# Patient Record
Sex: Male | Born: 1960 | Race: White | Hispanic: No | Smoking: Never smoker
Health system: Southern US, Community
[De-identification: ages and names within clinical notes are randomized; demographics above are authoritative.]

## PROBLEM LIST (undated history)

## (undated) DIAGNOSIS — IMO0001 Reserved for inherently not codable concepts without codable children: Secondary | ICD-10-CM

## (undated) DIAGNOSIS — Z91199 Patient's noncompliance with other medical treatment and regimen due to unspecified reason: Secondary | ICD-10-CM

## (undated) DIAGNOSIS — E669 Obesity, unspecified: Secondary | ICD-10-CM

## (undated) DIAGNOSIS — Z8673 Personal history of transient ischemic attack (TIA), and cerebral infarction without residual deficits: Secondary | ICD-10-CM

## (undated) DIAGNOSIS — I10 Essential (primary) hypertension: Secondary | ICD-10-CM

## (undated) DIAGNOSIS — N189 Chronic kidney disease, unspecified: Secondary | ICD-10-CM

## (undated) DIAGNOSIS — R05 Cough: Secondary | ICD-10-CM

## (undated) DIAGNOSIS — E039 Hypothyroidism, unspecified: Secondary | ICD-10-CM

## (undated) DIAGNOSIS — I5022 Chronic systolic (congestive) heart failure: Secondary | ICD-10-CM

## (undated) DIAGNOSIS — R480 Dyslexia and alexia: Secondary | ICD-10-CM

## (undated) DIAGNOSIS — I4891 Unspecified atrial fibrillation: Secondary | ICD-10-CM

## (undated) DIAGNOSIS — I4510 Unspecified right bundle-branch block: Secondary | ICD-10-CM

## (undated) DIAGNOSIS — Z9119 Patient's noncompliance with other medical treatment and regimen: Secondary | ICD-10-CM

## (undated) DIAGNOSIS — R054 Cough syncope: Secondary | ICD-10-CM

## (undated) DIAGNOSIS — M109 Gout, unspecified: Secondary | ICD-10-CM

## (undated) DIAGNOSIS — R55 Syncope and collapse: Secondary | ICD-10-CM

## (undated) DIAGNOSIS — I428 Other cardiomyopathies: Secondary | ICD-10-CM

## (undated) HISTORY — DX: Personal history of transient ischemic attack (TIA), and cerebral infarction without residual deficits: Z86.73

## (undated) HISTORY — DX: Unspecified right bundle-branch block: I45.10

## (undated) HISTORY — DX: Patient's noncompliance with other medical treatment and regimen: Z91.19

## (undated) HISTORY — PX: PICC LINE PLACE PERIPHERAL (ARMC HX): HXRAD1248

## (undated) HISTORY — DX: Dyslexia and alexia: R48.0

## (undated) HISTORY — DX: Hypothyroidism, unspecified: E03.9

## (undated) HISTORY — DX: Other cardiomyopathies: I42.8

## (undated) HISTORY — DX: Unspecified atrial fibrillation: I48.91

## (undated) HISTORY — DX: Obesity, unspecified: E66.9

## (undated) HISTORY — DX: Chronic systolic (congestive) heart failure: I50.22

## (undated) HISTORY — PX: CARDIAC CATHETERIZATION: SHX172

## (undated) HISTORY — DX: Patient's noncompliance with other medical treatment and regimen due to unspecified reason: Z91.199

## (undated) HISTORY — PX: KNEE ARTHROSCOPY: SHX127

## (undated) HISTORY — DX: Essential (primary) hypertension: I10

## (undated) HISTORY — DX: Gout, unspecified: M10.9

---

## 1996-01-19 DIAGNOSIS — E039 Hypothyroidism, unspecified: Secondary | ICD-10-CM

## 1996-01-19 DIAGNOSIS — I5022 Chronic systolic (congestive) heart failure: Secondary | ICD-10-CM

## 1996-01-19 HISTORY — DX: Hypothyroidism, unspecified: E03.9

## 1996-01-19 HISTORY — DX: Chronic systolic (congestive) heart failure: I50.22

## 1997-05-10 ENCOUNTER — Ambulatory Visit (HOSPITAL_COMMUNITY): Admission: RE | Admit: 1997-05-10 | Discharge: 1997-05-10 | Payer: Self-pay | Admitting: Cardiology

## 2002-02-01 ENCOUNTER — Encounter: Admission: RE | Admit: 2002-02-01 | Discharge: 2002-02-01 | Payer: Self-pay | Admitting: Family Medicine

## 2002-02-01 ENCOUNTER — Encounter: Payer: Self-pay | Admitting: Family Medicine

## 2003-12-18 ENCOUNTER — Ambulatory Visit: Payer: Self-pay | Admitting: Cardiology

## 2004-04-09 ENCOUNTER — Ambulatory Visit: Payer: Self-pay

## 2004-12-30 ENCOUNTER — Encounter: Payer: Self-pay | Admitting: Cardiology

## 2004-12-30 ENCOUNTER — Ambulatory Visit: Payer: Self-pay

## 2005-01-04 ENCOUNTER — Ambulatory Visit: Payer: Self-pay | Admitting: Cardiology

## 2005-01-20 ENCOUNTER — Ambulatory Visit: Payer: Self-pay | Admitting: Cardiology

## 2005-02-22 ENCOUNTER — Ambulatory Visit: Payer: Self-pay | Admitting: Cardiology

## 2005-03-08 ENCOUNTER — Ambulatory Visit: Payer: Self-pay | Admitting: Cardiology

## 2005-03-26 ENCOUNTER — Ambulatory Visit: Payer: Self-pay | Admitting: Cardiology

## 2005-05-07 ENCOUNTER — Ambulatory Visit: Payer: Self-pay | Admitting: Internal Medicine

## 2005-08-24 ENCOUNTER — Ambulatory Visit: Payer: Self-pay | Admitting: Cardiology

## 2006-09-23 ENCOUNTER — Ambulatory Visit: Payer: Self-pay | Admitting: Cardiology

## 2006-09-27 ENCOUNTER — Ambulatory Visit: Payer: Self-pay | Admitting: Cardiology

## 2006-09-27 LAB — CONVERTED CEMR LAB
BUN: 12 mg/dL
CO2: 26 meq/L
Calcium: 8.8 mg/dL
Chloride: 109 meq/L
Creatinine, Ser: 1.3 mg/dL
GFR calc Af Amer: 77 mL/min
GFR calc non Af Amer: 64 mL/min
Glucose, Bld: 106 mg/dL — ABNORMAL HIGH
Potassium: 4.2 meq/L
Pro B Natriuretic peptide (BNP): 560 pg/mL — ABNORMAL HIGH
Sodium: 142 meq/L
TSH: 1.72 u[IU]/mL

## 2006-10-17 ENCOUNTER — Ambulatory Visit: Payer: Self-pay | Admitting: Cardiology

## 2006-10-17 LAB — CONVERTED CEMR LAB
BUN: 17 mg/dL (ref 6–23)
Calcium: 9 mg/dL (ref 8.4–10.5)
Chloride: 110 meq/L (ref 96–112)
GFR calc non Af Amer: 64 mL/min
Glucose, Bld: 113 mg/dL — ABNORMAL HIGH (ref 70–99)

## 2006-10-25 ENCOUNTER — Ambulatory Visit: Payer: Self-pay | Admitting: Pulmonary Disease

## 2006-11-04 ENCOUNTER — Ambulatory Visit: Payer: Self-pay | Admitting: Cardiology

## 2006-11-08 ENCOUNTER — Encounter: Payer: Self-pay | Admitting: Cardiology

## 2006-11-08 ENCOUNTER — Ambulatory Visit: Payer: Self-pay | Admitting: Cardiology

## 2006-11-08 ENCOUNTER — Observation Stay (HOSPITAL_COMMUNITY): Admission: AD | Admit: 2006-11-08 | Discharge: 2006-11-10 | Payer: Self-pay | Admitting: Cardiology

## 2006-11-08 ENCOUNTER — Ambulatory Visit: Payer: Self-pay | Admitting: Cardiovascular Disease

## 2006-11-16 ENCOUNTER — Ambulatory Visit: Payer: Self-pay | Admitting: Internal Medicine

## 2007-01-26 ENCOUNTER — Ambulatory Visit: Payer: Self-pay | Admitting: Internal Medicine

## 2007-02-16 ENCOUNTER — Ambulatory Visit: Payer: Self-pay | Admitting: Internal Medicine

## 2007-06-05 ENCOUNTER — Ambulatory Visit: Payer: Self-pay | Admitting: Cardiology

## 2007-06-05 LAB — CONVERTED CEMR LAB
BUN: 17 mg/dL (ref 6–23)
Chloride: 105 meq/L (ref 96–112)
GFR calc non Af Amer: 64 mL/min
Potassium: 4.4 meq/L (ref 3.5–5.1)
Pro B Natriuretic peptide (BNP): 495 pg/mL — ABNORMAL HIGH (ref 0.0–100.0)
Sodium: 140 meq/L (ref 135–145)
TSH: 1.38 microintl units/mL (ref 0.35–5.50)

## 2007-07-05 ENCOUNTER — Ambulatory Visit: Payer: Self-pay | Admitting: Internal Medicine

## 2007-08-09 ENCOUNTER — Ambulatory Visit: Payer: Self-pay | Admitting: Internal Medicine

## 2007-08-09 LAB — CONVERTED CEMR LAB
Calcium: 9.5 mg/dL (ref 8.4–10.5)
GFR calc Af Amer: 65 mL/min
GFR calc non Af Amer: 54 mL/min
Glucose, Bld: 94 mg/dL (ref 70–99)
Potassium: 4.1 meq/L (ref 3.5–5.1)
Pro B Natriuretic peptide (BNP): 257 pg/mL — ABNORMAL HIGH (ref 0.0–100.0)
Sodium: 138 meq/L (ref 135–145)

## 2007-09-07 ENCOUNTER — Ambulatory Visit: Payer: Self-pay | Admitting: Internal Medicine

## 2007-09-15 ENCOUNTER — Ambulatory Visit (HOSPITAL_COMMUNITY): Admission: RE | Admit: 2007-09-15 | Discharge: 2007-09-15 | Payer: Self-pay | Admitting: Internal Medicine

## 2007-09-15 ENCOUNTER — Ambulatory Visit: Payer: Self-pay | Admitting: Internal Medicine

## 2007-10-11 ENCOUNTER — Ambulatory Visit: Payer: Self-pay | Admitting: Cardiology

## 2007-12-01 ENCOUNTER — Ambulatory Visit: Payer: Self-pay | Admitting: Cardiology

## 2007-12-01 LAB — CONVERTED CEMR LAB
Calcium: 9.5 mg/dL (ref 8.4–10.5)
GFR calc Af Amer: 84 mL/min
GFR calc non Af Amer: 70 mL/min
Potassium: 4.1 meq/L (ref 3.5–5.1)
Pro B Natriuretic peptide (BNP): 183 pg/mL — ABNORMAL HIGH (ref 0.0–100.0)
Sodium: 140 meq/L (ref 135–145)

## 2008-01-02 ENCOUNTER — Ambulatory Visit: Payer: Self-pay | Admitting: Cardiology

## 2008-02-06 ENCOUNTER — Ambulatory Visit: Payer: Self-pay | Admitting: Cardiology

## 2008-02-06 LAB — CONVERTED CEMR LAB
Calcium: 9.2 mg/dL (ref 8.4–10.5)
Creatinine, Ser: 1.2 mg/dL (ref 0.4–1.5)
GFR calc Af Amer: 84 mL/min
Magnesium: 2.2 mg/dL (ref 1.5–2.5)
Sodium: 140 meq/L (ref 135–145)

## 2008-03-08 ENCOUNTER — Ambulatory Visit: Payer: Self-pay | Admitting: Cardiology

## 2008-04-18 DIAGNOSIS — F810 Specific reading disorder: Secondary | ICD-10-CM

## 2008-04-18 DIAGNOSIS — E669 Obesity, unspecified: Secondary | ICD-10-CM

## 2008-04-18 DIAGNOSIS — I119 Hypertensive heart disease without heart failure: Secondary | ICD-10-CM | POA: Insufficient documentation

## 2008-04-18 DIAGNOSIS — R002 Palpitations: Secondary | ICD-10-CM | POA: Insufficient documentation

## 2008-04-23 ENCOUNTER — Ambulatory Visit: Payer: Self-pay | Admitting: Cardiology

## 2008-04-23 ENCOUNTER — Encounter: Payer: Self-pay | Admitting: Cardiology

## 2008-05-29 ENCOUNTER — Ambulatory Visit: Payer: Self-pay | Admitting: Cardiology

## 2008-07-17 ENCOUNTER — Telehealth: Payer: Self-pay | Admitting: Cardiology

## 2008-07-23 ENCOUNTER — Encounter: Payer: Self-pay | Admitting: Cardiology

## 2008-07-29 ENCOUNTER — Telehealth: Payer: Self-pay | Admitting: Cardiology

## 2008-07-29 ENCOUNTER — Ambulatory Visit: Payer: Self-pay | Admitting: Cardiology

## 2008-07-30 ENCOUNTER — Telehealth (INDEPENDENT_AMBULATORY_CARE_PROVIDER_SITE_OTHER): Payer: Self-pay | Admitting: *Deleted

## 2008-08-27 ENCOUNTER — Encounter: Payer: Self-pay | Admitting: Cardiology

## 2008-08-27 ENCOUNTER — Telehealth: Payer: Self-pay | Admitting: Cardiology

## 2008-09-02 ENCOUNTER — Telehealth: Payer: Self-pay | Admitting: Cardiology

## 2008-09-03 ENCOUNTER — Encounter: Payer: Self-pay | Admitting: Cardiology

## 2008-09-05 ENCOUNTER — Encounter (INDEPENDENT_AMBULATORY_CARE_PROVIDER_SITE_OTHER): Payer: Self-pay | Admitting: Nurse Practitioner

## 2008-10-15 ENCOUNTER — Encounter: Payer: Self-pay | Admitting: Cardiology

## 2008-10-25 ENCOUNTER — Encounter: Payer: Self-pay | Admitting: Cardiology

## 2008-11-12 ENCOUNTER — Ambulatory Visit: Payer: Self-pay | Admitting: Cardiology

## 2008-11-12 ENCOUNTER — Telehealth: Payer: Self-pay | Admitting: Cardiology

## 2008-11-18 ENCOUNTER — Ambulatory Visit: Payer: Self-pay | Admitting: Cardiology

## 2008-11-18 ENCOUNTER — Inpatient Hospital Stay (HOSPITAL_COMMUNITY): Admission: EM | Admit: 2008-11-18 | Discharge: 2008-11-27 | Payer: Self-pay | Admitting: Emergency Medicine

## 2008-11-18 ENCOUNTER — Ambulatory Visit: Payer: Self-pay | Admitting: Internal Medicine

## 2008-11-18 DIAGNOSIS — Z8673 Personal history of transient ischemic attack (TIA), and cerebral infarction without residual deficits: Secondary | ICD-10-CM

## 2008-11-18 HISTORY — DX: Personal history of transient ischemic attack (TIA), and cerebral infarction without residual deficits: Z86.73

## 2008-11-18 HISTORY — PX: SP PTA ADD INTRA CRAN: HXRAD405

## 2008-11-19 ENCOUNTER — Encounter (INDEPENDENT_AMBULATORY_CARE_PROVIDER_SITE_OTHER): Payer: Self-pay | Admitting: Neurology

## 2008-11-19 ENCOUNTER — Ambulatory Visit: Payer: Self-pay | Admitting: Surgery

## 2008-11-21 ENCOUNTER — Encounter (INDEPENDENT_AMBULATORY_CARE_PROVIDER_SITE_OTHER): Payer: Self-pay | Admitting: Neurology

## 2008-11-25 ENCOUNTER — Ambulatory Visit: Payer: Self-pay | Admitting: Physical Medicine & Rehabilitation

## 2008-11-27 ENCOUNTER — Encounter (INDEPENDENT_AMBULATORY_CARE_PROVIDER_SITE_OTHER): Payer: Self-pay | Admitting: Cardiology

## 2008-11-27 ENCOUNTER — Inpatient Hospital Stay (HOSPITAL_COMMUNITY)
Admission: RE | Admit: 2008-11-27 | Discharge: 2008-12-25 | Payer: Self-pay | Admitting: Physical Medicine & Rehabilitation

## 2008-11-27 ENCOUNTER — Ambulatory Visit: Payer: Self-pay | Admitting: Cardiology

## 2008-12-03 ENCOUNTER — Ambulatory Visit: Payer: Self-pay | Admitting: Psychology

## 2008-12-07 ENCOUNTER — Ambulatory Visit: Payer: Self-pay | Admitting: Physical Medicine & Rehabilitation

## 2008-12-26 ENCOUNTER — Encounter (INDEPENDENT_AMBULATORY_CARE_PROVIDER_SITE_OTHER): Payer: Self-pay | Admitting: Nurse Practitioner

## 2008-12-27 ENCOUNTER — Encounter (INDEPENDENT_AMBULATORY_CARE_PROVIDER_SITE_OTHER): Payer: Self-pay | Admitting: Cardiology

## 2008-12-27 ENCOUNTER — Encounter: Payer: Self-pay | Admitting: Cardiology

## 2008-12-27 LAB — CONVERTED CEMR LAB: POC INR: 3.07

## 2008-12-30 ENCOUNTER — Encounter: Payer: Self-pay | Admitting: Cardiovascular Disease

## 2008-12-30 ENCOUNTER — Encounter
Admission: RE | Admit: 2008-12-30 | Discharge: 2009-01-16 | Payer: Self-pay | Admitting: Physical Medicine & Rehabilitation

## 2008-12-30 LAB — CONVERTED CEMR LAB: Prothrombin Time: 30.6 s

## 2009-01-03 ENCOUNTER — Ambulatory Visit: Payer: Self-pay | Admitting: Cardiovascular Disease

## 2009-01-27 ENCOUNTER — Encounter
Admission: RE | Admit: 2009-01-27 | Discharge: 2009-04-27 | Payer: Self-pay | Admitting: Physical Medicine & Rehabilitation

## 2009-02-13 ENCOUNTER — Encounter: Payer: Self-pay | Admitting: Cardiology

## 2009-02-17 ENCOUNTER — Ambulatory Visit: Payer: Self-pay | Admitting: Cardiology

## 2009-02-17 ENCOUNTER — Encounter (INDEPENDENT_AMBULATORY_CARE_PROVIDER_SITE_OTHER): Payer: Self-pay | Admitting: *Deleted

## 2009-02-19 ENCOUNTER — Telehealth: Payer: Self-pay | Admitting: Cardiology

## 2009-02-24 ENCOUNTER — Ambulatory Visit: Payer: Self-pay | Admitting: Cardiovascular Disease

## 2009-02-24 ENCOUNTER — Telehealth: Payer: Self-pay | Admitting: Cardiology

## 2009-02-24 LAB — CONVERTED CEMR LAB: POC INR: 1.7

## 2009-03-03 ENCOUNTER — Telehealth: Payer: Self-pay | Admitting: Cardiology

## 2009-03-04 ENCOUNTER — Telehealth: Payer: Self-pay | Admitting: Cardiology

## 2009-03-13 ENCOUNTER — Ambulatory Visit: Payer: Self-pay | Admitting: Cardiology

## 2009-03-13 LAB — CONVERTED CEMR LAB: POC INR: 1.6

## 2009-03-14 LAB — CONVERTED CEMR LAB
Chloride: 108 meq/L (ref 96–112)
GFR calc non Af Amer: 69.14 mL/min (ref >60)
Potassium: 4.7 meq/L (ref 3.5–5.1)
Pro B Natriuretic peptide (BNP): 658 pg/mL — ABNORMAL HIGH (ref 0.0–100.0)
Sodium: 141 meq/L (ref 135–145)

## 2009-03-17 ENCOUNTER — Ambulatory Visit: Payer: Self-pay | Admitting: Cardiology

## 2009-03-19 ENCOUNTER — Telehealth: Payer: Self-pay | Admitting: Cardiology

## 2009-03-19 ENCOUNTER — Ambulatory Visit: Payer: Self-pay | Admitting: Cardiology

## 2009-03-20 LAB — CONVERTED CEMR LAB
CO2: 32 meq/L (ref 19–32)
Calcium: 9.5 mg/dL (ref 8.4–10.5)
Chloride: 101 meq/L (ref 96–112)
Glucose, Bld: 125 mg/dL — ABNORMAL HIGH (ref 70–99)
Potassium: 4.3 meq/L (ref 3.5–5.1)
Sodium: 137 meq/L (ref 135–145)

## 2009-03-28 ENCOUNTER — Ambulatory Visit: Payer: Self-pay | Admitting: Cardiology

## 2009-03-28 LAB — CONVERTED CEMR LAB: POC INR: 1.9

## 2009-04-11 ENCOUNTER — Ambulatory Visit: Payer: Self-pay | Admitting: Internal Medicine

## 2009-04-11 LAB — CONVERTED CEMR LAB: POC INR: 2

## 2009-04-25 ENCOUNTER — Ambulatory Visit: Payer: Self-pay | Admitting: Cardiology

## 2009-04-25 LAB — CONVERTED CEMR LAB: POC INR: 1.7

## 2009-05-05 ENCOUNTER — Ambulatory Visit: Payer: Self-pay | Admitting: Cardiology

## 2009-05-05 LAB — CONVERTED CEMR LAB: POC INR: 1.7

## 2009-05-19 ENCOUNTER — Ambulatory Visit: Payer: Self-pay | Admitting: Cardiology

## 2009-05-19 ENCOUNTER — Telehealth: Payer: Self-pay | Admitting: Cardiology

## 2009-05-19 LAB — CONVERTED CEMR LAB
CO2: 28 meq/L (ref 19–32)
Calcium: 9.2 mg/dL (ref 8.4–10.5)
Creatinine, Ser: 1.2 mg/dL (ref 0.4–1.5)
GFR calc non Af Amer: 69.08 mL/min (ref >60)
POC INR: 1.5
Sodium: 139 meq/L (ref 135–145)

## 2009-05-20 ENCOUNTER — Telehealth: Payer: Self-pay | Admitting: Cardiology

## 2009-06-05 ENCOUNTER — Ambulatory Visit: Payer: Self-pay | Admitting: Internal Medicine

## 2009-06-06 ENCOUNTER — Telehealth: Payer: Self-pay | Admitting: Cardiology

## 2009-06-06 ENCOUNTER — Inpatient Hospital Stay (HOSPITAL_COMMUNITY): Admission: EM | Admit: 2009-06-06 | Discharge: 2009-06-13 | Payer: Self-pay | Admitting: Emergency Medicine

## 2009-06-06 ENCOUNTER — Ambulatory Visit: Payer: Self-pay | Admitting: Cardiology

## 2009-06-06 ENCOUNTER — Encounter: Payer: Self-pay | Admitting: Cardiology

## 2009-06-09 ENCOUNTER — Encounter: Payer: Self-pay | Admitting: Internal Medicine

## 2009-06-10 ENCOUNTER — Encounter: Payer: Self-pay | Admitting: Internal Medicine

## 2009-06-11 ENCOUNTER — Encounter: Payer: Self-pay | Admitting: Internal Medicine

## 2009-06-11 HISTORY — PX: BI-VENTRICULAR IMPLANTABLE CARDIOVERTER DEFIBRILLATOR  (CRT-D): SHX5747

## 2009-06-12 ENCOUNTER — Encounter: Payer: Self-pay | Admitting: Internal Medicine

## 2009-06-18 ENCOUNTER — Ambulatory Visit: Payer: Self-pay | Admitting: Cardiology

## 2009-06-18 LAB — CONVERTED CEMR LAB: POC INR: 1.1

## 2009-06-25 ENCOUNTER — Ambulatory Visit: Payer: Self-pay | Admitting: Cardiology

## 2009-06-25 ENCOUNTER — Encounter: Payer: Self-pay | Admitting: Internal Medicine

## 2009-06-25 ENCOUNTER — Ambulatory Visit: Payer: Self-pay

## 2009-06-26 ENCOUNTER — Ambulatory Visit: Payer: Self-pay | Admitting: Internal Medicine

## 2009-06-26 DIAGNOSIS — Z95810 Presence of automatic (implantable) cardiac defibrillator: Secondary | ICD-10-CM

## 2009-06-27 ENCOUNTER — Telehealth: Payer: Self-pay | Admitting: Internal Medicine

## 2009-06-30 ENCOUNTER — Ambulatory Visit: Payer: Self-pay | Admitting: Cardiology

## 2009-06-30 ENCOUNTER — Telehealth: Payer: Self-pay | Admitting: Internal Medicine

## 2009-06-30 LAB — CONVERTED CEMR LAB
Basophils Absolute: 0.1 10*3/uL (ref 0.0–0.1)
Eosinophils Absolute: 0.1 10*3/uL (ref 0.0–0.7)
HCT: 41.2 % (ref 39.0–52.0)
Hemoglobin: 14.1 g/dL (ref 13.0–17.0)
Lymphocytes Relative: 16 % (ref 12.0–46.0)
Lymphs Abs: 1.2 10*3/uL (ref 0.7–4.0)
MCHC: 34.3 g/dL (ref 30.0–36.0)
Neutro Abs: 5.7 10*3/uL (ref 1.4–7.7)
POC INR: 2.4
Platelets: 305 10*3/uL (ref 150.0–400.0)
RDW: 16.4 % — ABNORMAL HIGH (ref 11.5–14.6)

## 2009-07-08 ENCOUNTER — Telehealth: Payer: Self-pay | Admitting: Internal Medicine

## 2009-07-14 ENCOUNTER — Ambulatory Visit: Payer: Self-pay | Admitting: Cardiology

## 2009-07-14 ENCOUNTER — Ambulatory Visit: Payer: Self-pay | Admitting: Internal Medicine

## 2009-07-14 LAB — CONVERTED CEMR LAB
INR: 3
POC INR: 3

## 2009-07-18 ENCOUNTER — Telehealth: Payer: Self-pay | Admitting: Internal Medicine

## 2009-07-23 ENCOUNTER — Telehealth: Payer: Self-pay | Admitting: Cardiology

## 2009-07-23 ENCOUNTER — Ambulatory Visit: Payer: Self-pay | Admitting: Internal Medicine

## 2009-07-25 LAB — CONVERTED CEMR LAB
Basophils Absolute: 0.1 10*3/uL (ref 0.0–0.1)
Bilirubin, Direct: 0.1 mg/dL (ref 0.0–0.3)
CO2: 30 meq/L (ref 19–32)
Calcium: 9 mg/dL (ref 8.4–10.5)
Creatinine, Ser: 1.4 mg/dL (ref 0.4–1.5)
Eosinophils Absolute: 0.3 10*3/uL (ref 0.0–0.7)
Glucose, Bld: 93 mg/dL (ref 70–99)
INR: 3.1 — ABNORMAL HIGH (ref 0.8–1.0)
Lymphocytes Relative: 22.4 % (ref 12.0–46.0)
MCHC: 34.3 g/dL (ref 30.0–36.0)
Neutrophils Relative %: 63 % (ref 43.0–77.0)
Platelets: 219 10*3/uL (ref 150.0–400.0)
RDW: 15.4 % — ABNORMAL HIGH (ref 11.5–14.6)
TSH: 21.41 microintl units/mL — ABNORMAL HIGH (ref 0.35–5.50)
Total Bilirubin: 0.7 mg/dL (ref 0.3–1.2)
Total Protein: 6.5 g/dL (ref 6.0–8.3)
aPTT: 45.5 s — ABNORMAL HIGH (ref 21.7–28.8)

## 2009-07-27 ENCOUNTER — Emergency Department (HOSPITAL_COMMUNITY): Admission: EM | Admit: 2009-07-27 | Discharge: 2009-07-27 | Payer: Self-pay | Admitting: Emergency Medicine

## 2009-07-29 ENCOUNTER — Telehealth: Payer: Self-pay | Admitting: Cardiology

## 2009-07-30 ENCOUNTER — Ambulatory Visit: Payer: Self-pay | Admitting: Internal Medicine

## 2009-07-30 ENCOUNTER — Ambulatory Visit (HOSPITAL_COMMUNITY): Admission: RE | Admit: 2009-07-30 | Discharge: 2009-07-30 | Payer: Self-pay | Admitting: Internal Medicine

## 2009-08-05 ENCOUNTER — Encounter: Payer: Self-pay | Admitting: Cardiology

## 2009-08-06 ENCOUNTER — Ambulatory Visit: Payer: Self-pay | Admitting: Cardiology

## 2009-08-06 LAB — CONVERTED CEMR LAB: POC INR: 2.6

## 2009-08-11 ENCOUNTER — Telehealth: Payer: Self-pay | Admitting: Internal Medicine

## 2009-09-03 ENCOUNTER — Ambulatory Visit: Payer: Self-pay | Admitting: Cardiology

## 2009-09-03 LAB — CONVERTED CEMR LAB: POC INR: 3

## 2009-09-16 ENCOUNTER — Ambulatory Visit: Payer: Self-pay | Admitting: Internal Medicine

## 2009-09-17 ENCOUNTER — Ambulatory Visit: Payer: Self-pay | Admitting: Cardiology

## 2009-09-23 LAB — CONVERTED CEMR LAB
CO2: 28 meq/L (ref 19–32)
Calcium: 9.2 mg/dL (ref 8.4–10.5)
Creatinine, Ser: 1.5 mg/dL (ref 0.4–1.5)

## 2009-10-01 ENCOUNTER — Ambulatory Visit: Payer: Self-pay | Admitting: Internal Medicine

## 2009-10-01 LAB — CONVERTED CEMR LAB: POC INR: 1.5

## 2009-10-22 ENCOUNTER — Ambulatory Visit: Payer: Self-pay | Admitting: Cardiovascular Disease

## 2009-11-18 ENCOUNTER — Encounter: Payer: Self-pay | Admitting: Cardiology

## 2009-11-19 ENCOUNTER — Ambulatory Visit: Payer: Self-pay | Admitting: Cardiology

## 2009-12-03 ENCOUNTER — Ambulatory Visit: Payer: Self-pay | Admitting: Cardiology

## 2009-12-03 ENCOUNTER — Ambulatory Visit: Payer: Self-pay | Admitting: Internal Medicine

## 2009-12-03 LAB — CONVERTED CEMR LAB: POC INR: 1.1

## 2009-12-17 ENCOUNTER — Ambulatory Visit: Payer: Self-pay | Admitting: Internal Medicine

## 2009-12-17 LAB — CONVERTED CEMR LAB: POC INR: 1.1

## 2009-12-24 ENCOUNTER — Ambulatory Visit: Payer: Self-pay | Admitting: Cardiology

## 2009-12-24 LAB — CONVERTED CEMR LAB: POC INR: 1.6

## 2009-12-29 ENCOUNTER — Telehealth: Payer: Self-pay | Admitting: Cardiology

## 2010-01-07 ENCOUNTER — Ambulatory Visit: Payer: Self-pay | Admitting: Cardiology

## 2010-01-21 ENCOUNTER — Ambulatory Visit: Admission: RE | Admit: 2010-01-21 | Discharge: 2010-01-21 | Payer: Self-pay | Source: Home / Self Care

## 2010-01-21 LAB — CONVERTED CEMR LAB: POC INR: 1.2

## 2010-01-28 ENCOUNTER — Ambulatory Visit: Admission: RE | Admit: 2010-01-28 | Discharge: 2010-01-28 | Payer: Self-pay | Source: Home / Self Care

## 2010-01-28 LAB — CONVERTED CEMR LAB: POC INR: 2.6

## 2010-02-08 ENCOUNTER — Encounter: Payer: Self-pay | Admitting: Physical Medicine & Rehabilitation

## 2010-02-15 LAB — CONVERTED CEMR LAB
Basophils Absolute: 0.1 10*3/uL (ref 0.0–0.1)
CO2: 30 meq/L (ref 19–32)
Calcium: 9.2 mg/dL (ref 8.4–10.5)
Creatinine, Ser: 1.3 mg/dL (ref 0.4–1.5)
Eosinophils Absolute: 0.7 10*3/uL (ref 0.0–0.7)
GFR calc non Af Amer: 63.13 mL/min (ref >60)
Glucose, Bld: 113 mg/dL — ABNORMAL HIGH (ref 70–99)
Hemoglobin: 14.9 g/dL (ref 13.0–17.0)
INR: 1 (ref 0.8–1.0)
Lymphocytes Relative: 22 % (ref 12.0–46.0)
MCHC: 34.3 g/dL (ref 30.0–36.0)
Monocytes Relative: 10.7 % (ref 3.0–12.0)
Neutro Abs: 4.1 10*3/uL (ref 1.4–7.7)
Neutrophils Relative %: 56.9 % (ref 43.0–77.0)
RBC: 4.5 M/uL (ref 4.22–5.81)
RDW: 12.8 % (ref 11.5–14.6)
Sodium: 138 meq/L (ref 135–145)

## 2010-02-18 ENCOUNTER — Ambulatory Visit: Admit: 2010-02-18 | Payer: Self-pay

## 2010-02-19 NOTE — Medication Information (Signed)
Summary: rov/ewj  Anticoagulant Therapy  Managed by: Bethena Midget, RN, BSN Referring MD: Dr Su Monks MD: Tenny Craw MD, Gunnar Fusi Indication 1: Cerebrovascular Accident Lab Used: [facility]  Site: [address] INR POC 2.0 INR RANGE 2.0-3.0  Dietary changes: no    Health status changes: no    Bleeding/hemorrhagic complications: no    Recent/future hospitalizations: no    Any changes in medication regimen? no    Recent/future dental: no  Any missed doses?: no       Is patient compliant with meds? yes       Allergies: No Known Drug Allergies  Anticoagulation Management History:      The patient is taking warfarin and comes in today for a routine follow up visit.  Negative risk factors for bleeding include an age less than 28 years old.  The bleeding index is 'low risk'.  Positive CHADS2 values include History of CHF and History of HTN.  Negative CHADS2 values include Age > 90 years old.  His last INR was 1.0 ratio.  Anticoagulation responsible provider: Tenny Craw MD, Gunnar Fusi.  INR POC: 2.0.  Cuvette Lot#: [id number].  Exp: 05/2010.    Anticoagulation Management Assessment/Plan:      The patient's current anticoagulation dose is Warfarin sodium 5 mg tabs: one by mouth daily or as directed, Warfarin sodium 7.5 mg tabs: 1 by mouth as directed.  The target INR is 2.0-3.0.  The next INR is due 04/25/2009.  Anticoagulation instructions were given to patient.  Results were reviewed/authorized by Bethena Midget, RN, BSN.  He was notified by Bethena Midget, RN, BSN.         Prior Anticoagulation Instructions: INR 1.9  Take 2 tablets today then start taking 1.5 tablets daily except 1 tablet on Sundays and Thursdays.  Recheck in 2 weeks.    Current Anticoagulation Instructions: INR 2.0 Change dose to 1.5 pills everyday except 1 pill on Thurdays. Recheck in 2 weeks. Pt. will incorporate some salads in per week.

## 2010-02-19 NOTE — Medication Information (Signed)
Summary: Raymond [name]  Anticoagulant Therapy  Managed by: Bethena Midget, RN, BSN Referring MD: Dr Su Monks MD: Antoine Poche MD, Fayrene Fearing Indication 1: Cerebrovascular Accident Lab Used: [facility] [place] Site: [address] INR POC 1.6 INR RANGE 2.0-3.0  Dietary changes: no    Health status changes: yes       Details: Had chest pain on Tues. when he got upset but it went away quickly. Experiencing alot of SOB.   Bleeding/hemorrhagic complications: no    Recent/future hospitalizations: no    Any changes in medication regimen? no    Recent/future dental: no  Any missed doses?: no       Is patient compliant with meds? yes      Comments: Seeing Dr Antoine Poche today.   Allergies: No Known Drug Allergies  Anticoagulation Management History:      The patient is taking warfarin and comes in today for a routine follow up visit.  Negative risk factors for bleeding include an age less than 32 years old.  The bleeding index is 'low risk'.  Positive CHADS2 values include History of CHF and History of HTN.  Negative CHADS2 values include Age > 49 years old.  His last INR was 1.0 ratio.  Anticoagulation responsible provider: Antoine Poche MD, Fayrene Fearing.  INR POC: 1.6.  Cuvette Lot#: [id number].  Exp: 04/2010.    Anticoagulation Management Assessment/Plan:      The patient's current anticoagulation dose is Warfarin sodium 5 mg tabs: one by mouth daily or as directed, Warfarin sodium 7.5 mg tabs: 1 by mouth as directed.  The next INR is due 03/28/2009.  Anticoagulation instructions were given to patient.  Results were reviewed/authorized by Bethena Midget, RN, BSN.  He was notified by Bethena Midget, RN, BSN.         Prior Anticoagulation Instructions: INR 1.7  TAKE AN EXTRA 1/2 TABLET TONIGHT.  THEN TAKE 1 TAB EVERYDAY EXCEPT TAKE 1.5 TABS ON MONDAY.  RECHECK IN 3 WEEKS.  Current Anticoagulation Instructions: INR 1.6 Today take 1.5 tablet. Then change dose to 1 tablet everyday except 1.5  tablets on Mondays, Wednesdays and Fridays. Recheck in 2 weeks.

## 2010-02-19 NOTE — Progress Notes (Signed)
Summary: QUESTIONS RE [facility]  Phone Note Call from Patient Call back at [facility] Phone [phone]   Caller: Philomena Course [phone] Reason for Call: Talk to Nurse Summary of Call: WAS IN YESTERDAY W/DRAINAGE--STERI-STRIPS WERE PLACED AND ARE NOW COMING LOOSE-IS THIS ALRIGHT Initial call taken by: Glynda Jaeger,  June 27, 2009 9:59 AM  Follow-up for Phone Call        I spoke with pt and his steri-strips did fall off today.  The pt has a new bandage over his incision and has not noticed any drainage through the bandage at this time.  The pt did get started on his antibiotic yesterday.  The pt denies fever, chills, redness or warmth at the incision.  I asked the pt to go into the ER over the weekend if he developed any of these symptoms.  The pt is scheduled to see Dr Graciela Husbands next week. Pt agrees with plan.  Follow-up by: Julieta Gutting, RN, BSN,  June 27, 2009 6:08 PM

## 2010-02-19 NOTE — Medication Information (Signed)
Summary: rov/nb  Anticoagulant Therapy  Managed by: Leota Sauers, PharmD, BCPS, CPP Referring MD: Dr Antoine Poche PCP: Ace Gins, MD Supervising MD: Cassell Clement, MD. Indication 1: Cerebrovascular Accident Indication 2: Atrial Fibrillation Lab Used: [facility] [place] Site: [address] INR POC 1.6 INR RANGE 2.0-3.0  Dietary changes: no    Health status changes: no    Bleeding/hemorrhagic complications: no    Recent/future hospitalizations: no    Any changes in medication regimen? no    Recent/future dental: no  Any missed doses?: no       Is patient compliant with meds? yes       Current Medications (verified): 1)  Levothroid 137 Mcg Tabs (Levothyroxine Sodium) .Marland Kitchen.. 1 By Mouth Daily 2)  Digoxin 0.25 Mg Tabs (Digoxin) .Marland Kitchen.. 1 By Mouth Daily 3)  Warfarin Sodium 5 Mg Tabs (Warfarin Sodium) .... One By Mouth Daily or As Directed 4)  Toprol Xl 50 Mg Xr24h-Tab (Metoprolol Succinate) .... One Twice A Day 5)  Spironolactone 50 Mg Tabs (Spironolactone) .... 1/2 Tablet Daily 6)  Warfarin Sodium 7.5 Mg Tabs (Warfarin Sodium) .Marland Kitchen.. 1 By Mouth As Directed 7)  Acyclovir 400 Mg Tabs (Acyclovir) .... Take 1 Tablet By Mouth Two Times A Day 8)  Furosemide 40 Mg Tabs (Furosemide) .... As Needed 9)  Enalapril Maleate 5 Mg Tabs (Enalapril Maleate) .... One Twice A Day 10)  Tramadol Hcl 50 Mg Tabs (Tramadol Hcl) .... One or Two Every 8 Hours As Needed For Pain 11)  Amiodarone Hcl 200 Mg Tabs (Amiodarone Hcl) .... Take One Tablet Once Daily  Allergies (verified): No Known Drug Allergies  Anticoagulation Management History:      The patient is taking warfarin and comes in today for a routine follow up visit.  Negative risk factors for bleeding include an age less than 80 years old.  The bleeding index is 'low risk'.  Positive CHADS2 values include History of CHF and History of HTN.  Negative CHADS2 values include Age > 40 years old.  His last INR was 3.1 ratio.   Anticoagulation responsible provider: Cassell Clement, MD..  INR POC: 1.6.  Cuvette Lot#: [id number].  Exp: 12/2010.    Anticoagulation Management Assessment/Plan:      The patient's current anticoagulation dose is Warfarin sodium 5 mg tabs: one by mouth daily or as directed, Warfarin sodium 7.5 mg tabs: 1 by mouth as directed.  The target INR is 2.0-3.0.  The next INR is due 01/07/2010.  Anticoagulation instructions were given to patient.  Results were reviewed/authorized by Leota Sauers, PharmD, BCPS, CPP.         Prior Anticoagulation Instructions: INR 1.1 Take 2.5 tablets today and tomorrow, take 2 tablet on Friday, then resume previous dose of 1.5 tablets everyday Recheck INR in 1 week  Current Anticoagulation Instructions: INR 1.6  Coumadin 7.5mg  tabs - take 1.5 tab on SUN and WED, 1 tab all other days Prescriptions: WARFARIN SODIUM 7.5 MG TABS (WARFARIN SODIUM) 1 by mouth as directed  #50 x 2   Entered by:   Leota Sauers, PharmD, BCPS, CPP   Authorized by:   Gaylord Shih, MD, [facility]   Signed by:   Leota Sauers, PharmD, BCPS, CPP on 12/24/2009   Method used:   Electronically to        [facility]* (retail)       [address]       [place], [place]  [address]       Ph:  [phone]       Fax: [phone]   RxID:   [id number]

## 2010-02-19 NOTE — Assessment & Plan Note (Signed)
Summary: pacer/defib site drainage   Primary Provider:  Ace Gins, MD  CC:  pacer/defib site drainage.  Marland Kitchen  History of Present Illness: This is a 50 year old white male patient who recently had a pacemaker defibrillator inserted and then underwent hematoma evacuation Jun 11, 2009 by Dr. Graciela Husbands. He comes in today as an add-on because of continued drainage at the pacemaker site. He claims his bandage as well as his shirt and blanket were saturated when he woke up this morning. he says the swelling is actually down he has no pain or discomfort in this area and is feeling fine.  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 He denies dyspnea dyspnea on exertion dizziness or presyncope.  Current Medications (verified): 1)  Levothyroxine Sodium 75 Mcg Tabs (Levothyroxine Sodium) .Marland Kitchen.. 1 By Mouth Daily 2)  Digoxin 0.25 Mg Tabs (Digoxin) .Marland Kitchen.. 1 By Mouth Daily 3)  Warfarin Sodium 5 Mg Tabs (Warfarin Sodium) .... One By Mouth Daily or As Directed 4)  Cymbalta 30 Mg Cpep (Duloxetine Hcl) .Marland Kitchen.. 1 By Mouth Daili 5)  Toprol Xl 50 Mg Xr24h-Tab (Metoprolol Succinate) .... One Twice A Day 6)  Spironolactone 50 Mg Tabs (Spironolactone) .... 1/2 Tablet Daily 7)  Warfarin Sodium 7.5 Mg Tabs (Warfarin Sodium) .Marland Kitchen.. 1 By Mouth As Directed 8)  Acyclovir 400 Mg Tabs (Acyclovir) .... Take 1 Tablet By Mouth Two Times A Day 9)  Furosemide 40 Mg Tabs (Furosemide) .... 2 By Mouth Daily-Hold 10)  Enalapril Maleate 5 Mg Tabs (Enalapril Maleate) .... One Twice A Day 11)  Aspirin 81 Mg  Tabs (Aspirin) .Marland Kitchen.. 1 By Mouth Daily 12)  Tramadol Hcl 50 Mg Tabs (Tramadol Hcl) .... One or Two Every 8 Hours As Needed For Pain 13)  Bactrim Ds 800-160 Mg Tabs (Sulfamethoxazole-Trimethoprim) .... One By Mouth Every 12 Hours For 14 Days 14)  Amiodarone Hcl 200 Mg Tabs (Amiodarone Hcl) .... Take One Tablet Once Daily  Allergies (verified): No Known Drug Allergies  Past History:  Past Medical  History: Last updated: 05/05/2009  1. 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   2. Palpitations with wide complex tachycardia in the past (currently       refusing ICD).   3. Previous history of ACE inhibitor intolerance.   4. Obesity.   5. Dyslexia.   6. Bifascicular block.   7. Hypertension.   8. Atrial fibrillation.   9. Hypothyroidism (status post radiation ablation of thyroid).   10. 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 Neck: No JVD, HJR, Bruit, or thyroid enlargement Lungs: No tachypnea, clear without wheezing, rales, or rhonchi Cardiovascular:pacer site bandage has dried blood on it and it. The pacer site actually looks good there is a small hematoma that is soft. I can't see any drainage at this time. Heart sounds distant, RRR, PMI  not displaced,  no murmurs, gallops, bruit, thrill, or heave. Abdomen: BS normal. Soft without organomegaly, masses, lesions or tenderness. Extremities: without cyanosis, clubbing or edema. Good distal pulses bilateral SKin: Warm, no lesions or rashes  Musculoskeletal: No deformities Neuro: no focal signs     ICD Specifications Following MD:  Sherryl Manges, MD     ICD Vendor:  St Jude     ICD Model Number:  [phone]     ICD Serial Number:  [id number] ICD DOI:  06/09/2009     ICD Implanting MD:  Sherryl Manges, MD  Lead 1:    Location: RA     DOI: 06/09/2009     Model #: [id number]     Serial #: [id number]     Status: active Lead 2:    Location: RV      DOI: 06/09/2009     Model #: [id number]     Serial #: [id number]     Status: active Lead 3:    Location: LV     DOI: 06/09/2009     Model #: [id number]     Serial #: [id number]     Status: active  Indications::  CM   Impression & Recommendations:  Problem # 1:  AUTOMATIC IMPLANTABLE CARDIAC DEFIBRILLATOR SITU (ICD-V45.02) Patient had defibrillator pacemaker placed and then had to undergo evacuation of a hematoma by Dr. Alberteen Spindle Jun 11, 2009. He's continued to have drainage but today his pacer site looks excellent. There is no evidence of infection. He is on Bactrim. There is no current drainage. His bandage was removed and did have evidence of some blood-tinged drainage no pus or infectious fluid. I did clean up his site with alcohol and re\re bandaged him.  Problem # 2:  HEART FAILURE (ICD-428.9) heart failure is compensated.  Problem # 3:  FIBRILLATION, ATRIAL (ICD-427.31) stable His updated medication list for this problem includes:    Digoxin 0.25 Mg Tabs (Digoxin) .Marland Kitchen... 1 by mouth daily    Warfarin Sodium 5 Mg Tabs (Warfarin sodium) ..... One by mouth daily or as directed    Toprol Xl 50 Mg Xr24h-tab (Metoprolol succinate) ..... One twice a day    Warfarin Sodium 7.5 Mg Tabs (Warfarin sodium) .Marland Kitchen... 1 by mouth as directed    Aspirin 81 Mg Tabs (Aspirin) .Marland Kitchen... 1 by mouth daily    Amiodarone Hcl 200 Mg Tabs (Amiodarone hcl) .Marland Kitchen... Take one tablet once daily  Patient Instructions: 1)  Your physician recommends that you schedule a follow-up appointment in: 2 weeks with Dr. Graciela Husbands 2)  Your physician recommends that you continue on your current medications as directed. Please refer to the Current Medication list given to you today.

## 2010-02-19 NOTE — Miscellaneous (Signed)
Summary: Device preload  Clinical Lists Changes  Observations: Added new observation of ICD INDICATN: CM (06/11/2009 16:23) Added new observation of ICDLEADSTAT3: active (06/11/2009 16:23) Added new observation of ICDLEADSER3: [id number] (06/11/2009 16:23) Added new observation of ICDLEADMOD3: 1258T (06/11/2009 16:23) Added new observation of ICDLEADLOC3: LV (06/11/2009 16:23) Added new observation of ICDLEADSTAT2: active (06/11/2009 16:23) Added new observation of ICDLEADSER2: [id number] (06/11/2009 16:23) Added new observation of ICDLEADMOD2: 7121  (06/11/2009 16:23) Added new observation of ICDLEADLOC2: RV  (06/11/2009 16:23) Added new observation of ICDLEADSTAT1: active  (06/11/2009 16:23) Added new observation of ICDLEADSER1: [id number]  (06/11/2009 16:23) Added new observation of ICDLEADMOD1: 3086VH  (06/11/2009 16:23) Added new observation of ICDLEADLOC1: RA  (06/11/2009 16:23) Added new observation of ICD IMP MD: Sherryl Manges, MD  (06/11/2009 16:23) Added new observation of ICDLEADDOI3: 06/09/2009  (06/11/2009 16:23) Added new observation of ICDLEADDOI2: 06/09/2009  (06/11/2009 16:23) Added new observation of ICDLEADDOI1: 06/09/2009  (06/11/2009 16:23) Added new observation of ICD IMPL DTE: 06/09/2009  (06/11/2009 16:23) Added new observation of ICD SERL#: [id number]  (06/11/2009 16:23) Added new observation of ICD MODL#: [id number]  (06/11/2009 32:44) Added new observation of ICDMANUFACTR: St Jude  (06/11/2009 16:23) Added new observation of ICD MD: Sherryl Manges, MD  (06/11/2009 16:23)       ICD Specifications Following MD:  Sherryl Manges, MD     ICD Vendor:  [employer] Jude     ICD Model Number:  [id number]     ICD Serial Number:  [id number] ICD DOI:  06/09/2009     ICD Implanting MD:  Sherryl Manges, MD  Lead 1:    Location: RA     DOI: 06/09/2009     Model #: [id number]     Serial #: [id number]     Status: active Lead 2:    Location: RV     DOI: 06/09/2009     Model #: [id number]     Serial #: [id number]      Status: active Lead 3:    Location: LV     DOI: 06/09/2009     Model #: 1258T     Serial #: [id number]     Status: active  Indications::  CM

## 2010-02-19 NOTE — Progress Notes (Signed)
Summary: refill request  Phone Note Refill Request Message from:  Patient on December 29, 2009 2:19 PM  Refills Requested: Medication #1:  TRAMADOL HCL 50 MG TABS one or two every 8 hours as needed for pain [facility] in [place] -pt states he needs it for back pain from stroke   Method Requested: Telephone to Pharmacy Initial call taken by: Glynda Jaeger,  December 29, 2009 2:20 PM  Follow-up for Phone Call        Per Pam, pt needs to get RX from pcp. Pt notified. Marrion Coy, CNA  December 29, 2009 3:01 PM  Follow-up by: Marrion Coy, CNA,  December 29, 2009 3:02 PM

## 2010-02-19 NOTE — Progress Notes (Signed)
Summary: dose pt need to keep friday appt  Phone Note Call from Patient Call back at Home Phone [phone]   Caller: Mom Reason for Call: Talk to Nurse, Talk to Doctor Summary of Call: pt wants to know dose he need to keep appt on friday since he is having a procedure next week Initial call taken by: Omer Jack,  July 23, 2009 9:36 AM  Follow-up for Phone Call        spoke with pt, he is scheduled for a cardioversion with dr Graciela Husbands 07-30-09. appt rescheduled with dr Numa Heatwole until after cardioversion Deliah Goody, RN  July 23, 2009 10:09 AM

## 2010-02-19 NOTE — Procedures (Signed)
Summary: WOUND CHECK --DRAINAGE   Current Medications (verified): 1)  Levothyroxine Sodium 75 Mcg Tabs (Levothyroxine Sodium) .Marland Kitchen.. 1 By Mouth Daily 2)  Digoxin 0.25 Mg Tabs (Digoxin) .Marland Kitchen.. 1 By Mouth Daily 3)  Warfarin Sodium 5 Mg Tabs (Warfarin Sodium) .... One By Mouth Daily or As Directed 4)  Cymbalta 30 Mg Cpep (Duloxetine Hcl) .Marland Kitchen.. 1 By Mouth Daili 5)  Toprol Xl 50 Mg Xr24h-Tab (Metoprolol Succinate) .... One Twice A Day 6)  Spironolactone 50 Mg Tabs (Spironolactone) .... 1/2 Tablet Daily 7)  Warfarin Sodium 7.5 Mg Tabs (Warfarin Sodium) .Marland Kitchen.. 1 By Mouth As Directed 8)  Acyclovir 400 Mg Tabs (Acyclovir) .... Take 1 Tablet By Mouth Two Times A Day 9)  Furosemide 40 Mg Tabs (Furosemide) .... 2 By Mouth Daily 10)  Enalapril Maleate 5 Mg Tabs (Enalapril Maleate) .... One Twice A Day 11)  Aspirin 81 Mg  Tabs (Aspirin) .Marland Kitchen.. 1 By Mouth Daily 12)  Tramadol Hcl 50 Mg Tabs (Tramadol Hcl) .... One or Two Every 8 Hours As Needed For Pain  Allergies (verified): No Known Drug Allergies  Comments:  Nurse/Medical Assistant: Raymond [name] was seen today for drainage from his device site.  His steri strips were removed yesterday.  HIs device was implanted 06/09/09 and he had a hematoma evacuation on the 25th.  On arrival to the office his T-shirt had 4 half-dollar sized spots from the drainage and half of the 4X4 dressing 3 ply thick was saturated with light brown drainage.  According to Raymond [name] this is the 2nd T-shirt this am he has soaked through.  The wound has a 1cm open area at the distal end.  I was able to express more of the same drainage then applied steri strips and 4X4's.  He start on Bactrim DS for 14 days, CBC today  and return next week to see Dr. Graciela Husbands, per Dr. Jenel Lucks orders today. Altha Harm, LPN  June 26, 1608 1:19 PM   Primary Provider:  Ace Gins, MD   History of Present Illness: Pt here for wound check.  Reports dark drainage from lateral aspect of incision. Denies fevers,  chills, warmth, redness, or other symptoms.   Physical Exam  General:  NAD Chest Wall:  incision line looks good.  No erythmema/ warmth/ tenderness.  He has moderate ecchymosis over L check and serosanguinous drainage from the lateral aspect of the pocket expressed   Impression & Recommendations:  Problem # 1:  AUTOMATIC IMPLANTABLE CARDIAC DEFIBRILLATOR SITU (ICD-V45.02) The patient has serosanguinous drainage from the lateral aspect of his ICD incision.  The pocket otherwise looks good.  He has diffuse ecchymosis over his left chest and had hematoma evacuated.  I hope that this is sterile sanguinous material and not infection.  He has no systemic symptoms of infection.  We will obtain a WBC today.  I will start bactrim DS two times a day x 14 days.  He will return early next week for a repeat exam by Dr Graciela Husbands.  He is aware to report immediately for fevers chills, increased redness/warmth/pain, fluctuance, etc.  Other Orders: TLB-CBC Platelet - w/Differential ([id number]-[id number])    ICD Specifications Following MD:  Sherryl Manges, MD     ICD Vendor:  St Jude     ICD Model Number:  [id number]     ICD Serial Number:  [id number] ICD DOI:  06/09/2009     ICD Implanting MD:  Sherryl Manges, MD  Lead 1:    Location: RA  DOI: 06/09/2009     Model #: [id number]     Serial #: [id number]     Status: active Lead 2:    Location: RV     DOI: 06/09/2009     Model #: [id number]     Serial #: [id number]     Status: active Lead 3:    Location: LV     DOI: 06/09/2009     Model #: 1258T     Serial #: [id number]     Status: active  Indications::  CM  Prescriptions: BACTRIM DS 800-160 MG TABS (SULFAMETHOXAZOLE-TRIMETHOPRIM) one by mouth every 12 hours for 14 days  #28 x 0   Entered by:   Altha Harm, LPN   Authorized by:   Hillis Range, MD   Signed by:   Hillis Range, MD on 06/26/2009   Method used:   Electronically to        [facility]* (retail)       [address]       [place], [place]  [address]       Ph:  [phone]       Fax: [phone]   RxID:   [id number]

## 2010-02-19 NOTE — Medication Information (Signed)
Summary: rov/tm  Anticoagulant Therapy  Managed by: Weston Brass, PharmD Referring MD: Dr Antoine Poche PCP: Ace Gins, MD Supervising MD: Antoine Poche MD, Fayrene Fearing Indication 1: Cerebrovascular Accident Indication 2: Atrial Fibrillation Lab Used: [facility] [place] Site: [address] INR POC 2.4 INR RANGE 2.0-3.0  Dietary changes: no    Health status changes: yes       Details: seeing PA today- having dark drainage from device site   Bleeding/hemorrhagic complications: no    Recent/future hospitalizations: no    Any changes in medication regimen? yes       Details: on bactrim for pacer site infection  Recent/future dental: no  Any missed doses?: no       Is patient compliant with meds? yes       Allergies: No Known Drug Allergies  Anticoagulation Management History:      The patient is taking warfarin and comes in today for a routine follow up visit.  Negative risk factors for bleeding include an age less than 39 years old.  The bleeding index is 'low risk'.  Positive CHADS2 values include History of CHF and History of HTN.  Negative CHADS2 values include Age > 93 years old.  His last INR was 1.0 ratio.  Anticoagulation responsible provider: Antoine Poche MD, Fayrene Fearing.  INR POC: 2.4.  Cuvette Lot#: [id number].  Exp: 08/19/2010.    Anticoagulation Management Assessment/Plan:      The patient's current anticoagulation dose is Warfarin sodium 5 mg tabs: one by mouth daily or as directed, Warfarin sodium 7.5 mg tabs: 1 by mouth as directed.  The target INR is 2.0-3.0.  The next INR is due 07/11/2009.  Anticoagulation instructions were given to patient.  Results were reviewed/authorized by Weston Brass, PharmD.  He was notified by Weston Brass PharmD.         Prior Anticoagulation Instructions: INR 2.2 Continue 1 1/2  pills everyday except 1 pill on Tuesdays and Saturdays. Recheck in one week.   Current Anticoagulation Instructions: INR 2.4  Continue same dose of 1 1/2 tablets  every day except 1 tablet on Tuesday and Saturday.

## 2010-02-19 NOTE — Cardiovascular Report (Signed)
Summary: Office Visit   Office Visit   Imported By: Roderic Ovens 09/17/2009 12:26:22  _____________________________________________________________________  External Attachment:    Type:   Image     Comment:   External Document

## 2010-02-19 NOTE — Medication Information (Signed)
Summary: ccr  Anticoagulant Therapy  Managed by: Cloyde Reams, RN, BSN Referring MD: Dr Su Monks MD: Riley Kill MD, Maisie Fus Indication 1: Cerebrovascular Accident Lab Used: [facility] [place] Site: [address] INR POC 1.5 INR RANGE 2.0-3.0  Dietary changes: no    Health status changes: no    Bleeding/hemorrhagic complications: yes       Details: Bruise size of softball under L arm appeared yesterday.    Recent/future hospitalizations: no    Any changes in medication regimen? no    Recent/future dental: no  Any missed doses?: no       Is patient compliant with meds? yes       Allergies (verified): No Known Drug Allergies  Anticoagulation Management History:      The patient is taking warfarin and comes in today for a routine follow up visit.  Negative risk factors for bleeding include an age less than 82 years old.  The bleeding index is 'low risk'.  Positive CHADS2 values include History of CHF and History of HTN.  Negative CHADS2 values include Age > 29 years old.  His last INR was 1.0 ratio.  Anticoagulation responsible provider: Riley Kill MD, Maisie Fus.  INR POC: 1.5.  Exp: 04/2010.    Anticoagulation Management Assessment/Plan:      The patient's current anticoagulation dose is Warfarin sodium 5 mg tabs: one by mouth daily or as directed, Warfarin sodium 7.5 mg tabs: 1 by mouth as directed.  The next INR is due 03/28/2009.  Anticoagulation instructions were given to patient.  Results were reviewed/authorized by Cloyde Reams, RN, BSN.  He was notified by Cloyde Reams RN.         Prior Anticoagulation Instructions: INR 1.6 Today take 1.5 tablet. Then change dose to 1 tablet everyday except 1.5 tablets on Mondays, Wednesdays and Fridays. Recheck in 2 weeks.   Current Anticoagulation Instructions: INR 1.5  Take 2 tablets today then start taking 1.5 tablets  daily except 1 tablet on Sundays, Tuesdays, and Thursdays.   Recheck in 10 days.

## 2010-02-19 NOTE — Progress Notes (Signed)
Summary: pls call mother (health care power of atty) pt doesnt understand  Phone Note Call from Patient Call back at [phone]   Caller: Mom Reason for Call: Talk to Nurse Summary of Call: request to speak with Dr Antoine Poche about device Initial call taken by: Migdalia Dk,  May 20, 2009 1:38 PM  Follow-up for Phone Call        Spoke with pt's mother who has health care power of atty.  States pt called her and is quite confused about the need for a "dafiblurator"  She would like Dr Antoine Poche to call her to discuss.  Sander Nephew, RN  Additional Follow-up for Phone Call Additional follow up Details #1::        Called to discuss with the patients mother so that she will understand the reason for ICD. Additional Follow-up by: [name], MD, [facility],  May 26, 2009 1:48 PM

## 2010-02-19 NOTE — Progress Notes (Signed)
Summary: pt needs refill on meds  Phone Note Call from Patient Call back at [phone]   Caller: Mom Reason for Call: Talk to Nurse, Talk to Doctor Summary of Call: pt needs some meds refilled and they where perscribed by Dr. Cato Mulligan and since he dosn't see Dr. Cato Mulligan anymore would Dr. Antoine Poche be willing to write the Rx.Jim Desanctis uses [facility] in [place]...mom dosen't know pharm number but she is suppose have the information when you callher back. Initial call taken by: Omer Jack,  February 19, 2009 11:59 AM  Follow-up for Phone Call        ok to fill one time.  pt needs a new primary care Follow-up by: Charolotte Capuchin, RN,  February 19, 2009 5:59 PM    Prescriptions: ACYCLOVIR 200 MG CAPS (ACYCLOVIR) 2 by mouth two times a day  #30 x 1   Entered by:   Charolotte Capuchin, RN   Authorized by:   [name], MD, [facility]   Signed by:   Charolotte Capuchin, RN on 02/19/2009   Method used:   Electronically to        [facility]* (retail)       [address]       [place], [place]  [address]       Ph: [phone]       Fax: [phone]   RxID:   [id number] CYMBALTA 30 MG CPEP (DULOXETINE HCL) 1 by mouth daili  #30 x 1   Entered by:   Charolotte Capuchin, RN   Authorized by:   [name], MD, [facility]   Signed by:   Charolotte Capuchin, RN on 02/19/2009   Method used:   Electronically to        [facility]* (retail)       [address]       [place], [place]  [address]       Ph: [phone]       Fax: [phone]   RxID:   [id number] LEVOTHYROXINE SODIUM 75 MCG TABS (LEVOTHYROXINE SODIUM) 1 by mouth daily  #30 x 1   Entered by:   Charolotte Capuchin, RN   Authorized by:   [name], MD, [facility]   Signed by:   Charolotte Capuchin, RN on 02/19/2009   Method used:   Electronically to        [facility]* (retail)       [address]       [place], [place]  [address]       Ph: [phone]       Fax: [phone]  RxID:   [id number]

## 2010-02-19 NOTE — Assessment & Plan Note (Signed)
Summary: PER CHECK OUT/SF   Visit Type:  Follow-up Primary Provider:  Ace Gins, MD  CC:  Atrial Fibrillation and CHF.  History of Present Illness: The patient returns for followup after recent cardioversion. He is status post a defibrillator with defibrillator testing and subsequent conversion to sinus rhythm. Overall he thinks he's feeling better. He is not describing breathlessness, PND or orthopnea. He is not describing lightheadedness, presyncope or syncope. He rarely gets some emotional driven chest discomfort. He is recovering from his stroke with improved leg strength but residual left upper arm strength.  Current Medications (verified): 1)  Levothyroxine Sodium 75 Mcg Tabs (Levothyroxine Sodium) .Marland Kitchen.. 1 By Mouth Daily 2)  Digoxin 0.25 Mg Tabs (Digoxin) .Marland Kitchen.. 1 By Mouth Daily 3)  Warfarin Sodium 5 Mg Tabs (Warfarin Sodium) .... One By Mouth Daily or As Directed 4)  Toprol Xl 50 Mg Xr24h-Tab (Metoprolol Succinate) .... One Twice A Day 5)  Spironolactone 50 Mg Tabs (Spironolactone) .... 1/2 Tablet Daily 6)  Warfarin Sodium 7.5 Mg Tabs (Warfarin Sodium) .Marland Kitchen.. 1 By Mouth As Directed 7)  Acyclovir 400 Mg Tabs (Acyclovir) .... Take 1 Tablet By Mouth Two Times A Day 8)  Furosemide 40 Mg Tabs (Furosemide) .... As Needed 9)  Enalapril Maleate 5 Mg Tabs (Enalapril Maleate) .... One Twice A Day 10)  Tramadol Hcl 50 Mg Tabs (Tramadol Hcl) .... One or Two Every 8 Hours As Needed For Pain 11)  Amiodarone Hcl 200 Mg Tabs (Amiodarone Hcl) .... Take One Tablet Once Daily  Allergies (verified): No Known Drug Allergies  Past History:  Past Medical History:  1. 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   2. Palpitations with wide complex tachycardia in the past (currently       refusing ICD).   3. Previous history of ACE inhibitor intolerance.   4. Obesity.   5. Dyslexia.   6. Bifascicular block.   7. Hypertension.   8. Atrial fibrillation s/p ablation  9. Hypothyroidism  (status post radiation ablation of thyroid).   10. 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 Left knee surgery in 94 and 95  Review of Systems       As stated in the HPI and negative for all other systems.   Vital Signs:  Patient profile:   50 year old male Height:      71 inches Weight:      246 pounds BMI:     34.43 Pulse rate:   60 / minute Resp:     18 per minute BP sitting:   98 / 78  (right arm)  Vitals Entered By: Marrion Coy, CNA (August 06, 2009 8:48 AM)  Physical Exam  General:  Well developed, well nourished, in no acute distress. Head:  normocephalic and atraumatic Neck:  Neck supple, no JVD. No masses, thyromegaly or abnormal cervical nodes. Chest Wall:  Well-healed ICD scar Lungs:  Clear bilaterally to auscultation and percussion. Abdomen:  Bowel sounds positive; abdomen soft and non-tender without masses, organomegaly, or hernias noted. No hepatosplenomegaly,obese Msk:  Slightly ataxic gait.. Muscle strength and tone normal. Extremities:  No clubbing or cyanosis. Neurologic:  Alert and oriented x 3. Psych:  Normal affect.   Detailed Cardiovascular Exam  Neck    Carotids: Carotids full and equal bilaterally without bruits.  Neck Veins: Normal, no JVD.    Heart    Inspection: no deformities or lifts noted.      Palpation: normal PMI with no thrills palpable.      Auscultation: S1 and S2 within normal limits, no S3, no S4, no clicks, no rubs, thrill 6 apical systolic murmur radiating slightly to the axilla, no diastolic murmurs.  Vascular    Abdominal Aorta: no palpable masses, pulsations, or audible bruits.      Femoral Pulses: normal femoral pulses bilaterally.      Pedal Pulses: normal pedal pulses bilaterally.      Radial Pulses: normal radial pulses bilaterally.        Peripheral Circulation: no clubbing, cyanosis, with normal capillary refill.     EKG  Procedure date:  08/06/2009  Findings:      Sinus rhythm, rate 66, first degree AV block, left axis deviation, right bundle branch block, pseudofusion   ICD Specifications Following MD:  Sherryl Manges, MD     ICD Vendor:  [employer]     ICD Model Number:  [phone]     ICD Serial Number:  [id number] ICD DOI:  06/09/2009     ICD Implanting MD:  Sherryl Manges, MD  Lead 1:    Location: RA     DOI: 06/09/2009     Model #: [id number]     Serial #: [id number]     Status: active Lead 2:    Location: RV     DOI: 06/09/2009     Model #: [id number]     Serial #: [id number]     Status: active Lead 3:    Location: LV     DOI: 06/09/2009     Model #: [id number]     Serial #: [id number]     Status: active  Indications::  CM   Brady Parameters Mode DDD     Lower Rate Limit:  60     Upper Rate Limit 130 PAV 180     Sensed AV Delay:  160  Tachy Zones VF:  222     Impression & Recommendations:  Problem # 1:  HYPOTHYROIDISM (ICD-244.9) His TSH was very elevated at the last reading but his T4 was normal. I will not make any major changes but will defer to his primary provider.  Problem # 2:  AUTOMATIC IMPLANTABLE CARDIAC DEFIBRILLATOR SITU (ICD-V45.02) His device shows pseudo-effusion so he is not pacing he is right or left ventricle. However, he remains asymptomatic. He also has right bundle branch block. We could change his parameters in the future if he has increasing symptoms.  Problem # 3:  FIBRILLATION, ATRIAL (ICD-427.31) He is in sinus rhythm but will remain on Coumadin. Orders: EKG w/ Interpretation (93000)  Problem # 4:  HEART FAILURE (ICD-428.9) His blood pressure would not allow med titration.  Patient Instructions: 1)  Your physician recommends that you schedule a follow-up appointment in: 4 months 2)  Your physician recommends that you continue on your current medications as directed. Please refer to the Current  Medication list given to you today.

## 2010-02-19 NOTE — Medication Information (Signed)
Summary: rov coumadin - [facility]  Anticoagulant Therapy  Managed by: Bethena Midget, RN, BSN Referring MD: Dr Antoine Poche PCP: Ace Gins, MD Supervising MD: Shirlee Latch MD, Bobbyjoe Pabst Indication 1: Cerebrovascular Accident Indication 2: Atrial Fibrillation Lab Used: [facility] [place] Site: [address] INR POC 1.5 INR RANGE 2.0-3.0  Dietary changes: no    Health status changes: no    Bleeding/hemorrhagic complications: no    Recent/future hospitalizations: no    Any changes in medication regimen? no    Recent/future dental: no  Any missed doses?: yes     Details: Pt states he didn't take any med on Mondays and Tuesdays.   Is patient compliant with meds? yes       Allergies: No Known Drug Allergies  Anticoagulation Management History:      The patient is taking warfarin and comes in today for a routine follow up visit.  Negative risk factors for bleeding include an age less than 33 years old.  The bleeding index is 'low risk'.  Positive CHADS2 values include History of CHF and History of HTN.  Negative CHADS2 values include Age > 60 years old.  His last INR was 3.1 ratio.  Anticoagulation responsible provider: Shirlee Latch MD, Alexzandria Massman.  INR POC: 1.5.  Cuvette Lot#: [id number].  Exp: 02/2011.    Anticoagulation Management Assessment/Plan:      The patient's current anticoagulation dose is Warfarin sodium 5 mg tabs: one by mouth daily or as directed, Warfarin sodium 7.5 mg tabs: 1 by mouth as directed.  The target INR is 2.0-3.0.  The next INR is due 01/21/2010.  Anticoagulation instructions were given to patient.  Results were reviewed/authorized by Bethena Midget, RN, BSN.  He was notified by Bethena Midget, RN, BSN.         Prior Anticoagulation Instructions: INR 1.6  Coumadin 7.5mg  tabs - take 1.5 tab on SUN and WED, 1 tab all other days  Current Anticoagulation Instructions: INR 1.5 Today take 2 pills then resume 1 pill everyday except 1.5 pills on Sundays and Wednesdays.  Recheck in 2 weeks.

## 2010-02-19 NOTE — Medication Information (Signed)
Summary: rov/tm  Anticoagulant Therapy  Managed by: Cloyde Reams, RN, BSN Referring MD: Dr Su Monks MD: Antoine Poche MD, Fayrene Fearing Indication 1: Cerebrovascular Accident Lab Used: [facility] [place] Site: [address] INR POC 1.9 INR RANGE 2.0-3.0  Dietary changes: no    Health status changes: no    Bleeding/hemorrhagic complications: no    Recent/future hospitalizations: no    Any changes in medication regimen? no    Recent/future dental: no  Any missed doses?: no       Is patient compliant with meds? yes       Allergies: No Known Drug Allergies  Anticoagulation Management History:      The patient is taking warfarin and comes in today for a routine follow up visit.  Negative risk factors for bleeding include an age less than 4 years old.  The bleeding index is 'low risk'.  Positive CHADS2 values include History of CHF and History of HTN.  Negative CHADS2 values include Age > 29 years old.  His last INR was 1.0 ratio.  Anticoagulation responsible provider: Antoine Poche MD, Fayrene Fearing.  INR POC: 1.9.  Cuvette Lot#: [id number].  Exp: 05/2010.    Anticoagulation Management Assessment/Plan:      The patient's current anticoagulation dose is Warfarin sodium 5 mg tabs: one by mouth daily or as directed, Warfarin sodium 7.5 mg tabs: 1 by mouth as directed.  The target INR is 2.0-3.0.  The next INR is due 04/11/2009.  Anticoagulation instructions were given to patient.  Results were reviewed/authorized by Cloyde Reams, RN, BSN.  He was notified by Cloyde Reams RN.         Prior Anticoagulation Instructions: INR 1.5  Take 2 tablets today then start taking 1.5 tablets  daily except 1 tablet on Sundays, Tuesdays, and Thursdays.   Recheck in 10 days.   Current Anticoagulation Instructions: INR 1.9  Take 2 tablets today then start taking 1.5 tablets daily except 1 tablet on Sundays and Thursdays.  Recheck in 2 weeks.

## 2010-02-19 NOTE — Medication Information (Signed)
Summary: ROV/JM  Anticoagulant Therapy  Managed by: Bethanne Ginger, PharmD Referring MD: Dr Su Monks MD: Shirlee Latch MD, [place] Indication 1: Cerebrovascular Accident Lab Used: [facility]  Site: [address] INR POC 1.5 INR RANGE 2.0-3.0  Dietary changes: no    Health status changes: no    Bleeding/hemorrhagic complications: no    Recent/future hospitalizations: no    Any changes in medication regimen? no    Recent/future dental: no  Any missed doses?: yes     Details: Missed one dose last week - got his meds mixed up  Is patient compliant with meds? yes       Allergies: No Known Drug Allergies  Anticoagulation Management History:      The patient is taking warfarin and comes in today for a routine follow up visit.  Negative risk factors for bleeding include an age less than 43 years old.  The bleeding index is 'low risk'.  Positive CHADS2 values include History of CHF and History of HTN.  Negative CHADS2 values include Age > 80 years old.  His last INR was 1.0 ratio.  Anticoagulation responsible provider: Shirlee Latch MD, [place].  INR POC: 1.5.  Cuvette Lot#: [id number].  Exp: 04/2010.    Anticoagulation Management Assessment/Plan:      The patient's current anticoagulation dose is Warfarin sodium 5 mg tabs: one by mouth daily or as directed.  The next INR is due 02/27/2009.  Anticoagulation instructions were given to spouse.  Results were reviewed/authorized by Bethanne Ginger, PharmD.  He was notified by Bethanne Ginger.         Prior Anticoagulation Instructions: INR 2.0  TAKE 1 TAB TONIGHT. THEN TAKE 1 TAB EVERYDAY EXCEPT TAKE 1/2 TAB ON TUESDAY.  RECHECK IN 3 WEEKS.  Current Anticoagulation Instructions: INR 1.5  Take 1.5 tabs today then increase to 1 tab daily.  Return to office on 02/27/2009 for recheck.

## 2010-02-19 NOTE — Progress Notes (Signed)
Summary: sythroid was increase by sk / need a rx  Phone Note Call from Patient Call back at Home Phone [phone]   Caller: Mom Reason for Call: Talk to Nurse Summary of Call: per pt mom called. sk increase his synthroid, mom unsure of the mg pt should be on. a rx need to be called in . [facility] in [place]  Initial call taken by: Lorne Skeens,  August 11, 2009 9:02 AM  Follow-up for Phone Call        spoke with pt, when he had his procedure with dr Graciela Husbands he was told to increase his synthroid to 1 and 1/2 tablets daily. he has been taking that dose and now needs a refill. he has a follow up appt with dr Graciela Husbands in august. refill given. Deliah Goody, RN  August 11, 2009 1:24 PM     New/Updated Medications: LEVOTHYROXINE SODIUM 75 MCG TABS (LEVOTHYROXINE SODIUM) 1and 1/2 tablets by mouth daily Prescriptions: LEVOTHYROXINE SODIUM 75 MCG TABS (LEVOTHYROXINE SODIUM) 1and 1/2 tablets by mouth daily  #45 x 12   Entered by:   Deliah Goody, RN   Authorized by:   Nathen May, MD, [facility]   Signed by:   Deliah Goody, RN on 08/11/2009   Method used:   Electronically to        [facility]* (retail)       [address]       [place], [place]  [address]       Ph: [phone]       Fax: [phone]   RxID:   [id number]

## 2010-02-19 NOTE — Medication Information (Signed)
Summary: rov/sl  Anticoagulant Therapy  Managed by: Bethena Midget, RN, BSN Referring MD: Dr Antoine Poche PCP: Ace Gins, MD Supervising MD: Riley Kill MD, Maisie Fus Indication 1: Cerebrovascular Accident Indication 2: Atrial Fibrillation Lab Used: [facility] [place] Site: [address] INR POC 1.5 INR RANGE 2.0-3.0  Dietary changes: no    Health status changes: no    Bleeding/hemorrhagic complications: no    Recent/future hospitalizations: no    Any changes in medication regimen? no    Recent/future dental: no  Any missed doses?: yes     Details: Missed 2 days at end of last week  Is patient compliant with meds? yes       Allergies: No Known Drug Allergies  Anticoagulation Management History:      The patient is taking warfarin and comes in today for a routine follow up visit.  Negative risk factors for bleeding include an age less than 95 years old.  The bleeding index is 'low risk'.  Positive CHADS2 values include History of CHF and History of HTN.  Negative CHADS2 values include Age > 47 years old.  His last INR was 3.1 ratio.  Anticoagulation responsible provider: Riley Kill MD, Maisie Fus.  INR POC: 1.5.  Cuvette Lot#: [id number].  Exp: 11/2010.    Anticoagulation Management Assessment/Plan:      The patient's current anticoagulation dose is Warfarin sodium 5 mg tabs: one by mouth daily or as directed, Warfarin sodium 7.5 mg tabs: 1 by mouth as directed.  The target INR is 2.0-3.0.  The next INR is due 12/03/2009.  Anticoagulation instructions were given to patient.  Results were reviewed/authorized by Bethena Midget, RN, BSN.  He was notified by Bethena Midget, RN, BSN.         Prior Anticoagulation Instructions: INR 2.1  Continue taking Coumadin 7.5 mg (1.5 tabs) on all days except Coumadin 5 mg (1 tab) on Tuesdays and Saturdays.  Return to clinic in 3 weeks.   Current Anticoagulation Instructions: INR 1.5 Today take 2 pills and Thursday take 2 pills then  resume 1  1/2 pills everyday except 1 pill on Tuesdays and Saturdays. Recheck in 2 weeks.

## 2010-02-19 NOTE — Medication Information (Signed)
Summary: Raymond [name]  Anticoagulant Therapy  Managed by: Bethena Midget, RN, BSN Referring MD: Dr Antoine Poche PCP: Ace Gins, MD Supervising MD: Juanda Chance MD, Chance Munter Indication 1: Cerebrovascular Accident Indication 2: Atrial Fibrillation Lab Used: [facility] [place] Site: [address] INR POC 2.2 INR RANGE 2.0-3.0  Dietary changes: no    Health status changes: no    Bleeding/hemorrhagic complications: no    Recent/future hospitalizations: no    Any changes in medication regimen? no    Recent/future dental: no  Any missed doses?: no       Is patient compliant with meds? yes       Allergies: No Known Drug Allergies  Anticoagulation Management History:      The patient is taking warfarin and comes in today for a routine follow up visit.  Negative risk factors for bleeding include an age less than 50 years old.  The bleeding index is 'low risk'.  Positive CHADS2 values include History of CHF and History of HTN.  Negative CHADS2 values include Age > 93 years old.  His last INR was 1.0 ratio.  Anticoagulation responsible provider: Juanda Chance MD, Smitty Cords.  INR POC: 2.2.  Cuvette Lot#: [id number].  Exp: 08/19/2010.    Anticoagulation Management Assessment/Plan:      The patient's current anticoagulation dose is Warfarin sodium 5 mg tabs: one by mouth daily or as directed, Warfarin sodium 7.5 mg tabs: 1 by mouth as directed.  The target INR is 2.0-3.0.  The next INR is due 07/02/2009.  Anticoagulation instructions were given to patient.  Results were reviewed/authorized by Bethena Midget, RN, BSN.  He was notified by Bethena Midget, RN, BSN.         Prior Anticoagulation Instructions: INR 1.1. Take 1.5 tablets daily except 1 tablet on Tues and Sat.  Recheck in 1 week.  Current Anticoagulation Instructions: INR 2.2 Continue 1 1/2  pills everyday except 1 pill on Tuesdays and Saturdays. Recheck in one week.

## 2010-02-19 NOTE — Assessment & Plan Note (Signed)
Summary: F/U ATRIAL FIB/CHF  PFH,RN   Visit Type:  Follow-up  CC:  CHF.  History of Present Illness: The patient presents for followup of his cardiomyopathy. He was seen as an add-on by Dr. Excell Seltzer recently and was noted to have increased dyspnea. He was started on Lasix at that time and his beta blocker dose was slightly increased. He was improved for a while but now is again short of breath. He is describing 3-4 pillow orthopnea. He is describing dyspnea with exertion such as walking to his house. He's not been describing cough fevers or chills. He's had one episode of fleeting chest pain but no chest pressure, neck or arm discomfort. He is not describing palpitations, presyncope or syncope. 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 He says he is compliant with salt in diet.  Current Medications (verified): 1)  Levothyroxine Sodium 75 Mcg Tabs (Levothyroxine Sodium) .Marland Kitchen.. 1 By Mouth Daily 2)  Digoxin 0.25 Mg Tabs (Digoxin) .Marland Kitchen.. 1 By Mouth Daily 3)  Warfarin Sodium 5 Mg Tabs (Warfarin Sodium) .... One By Mouth Daily or As Directed 4)  Cymbalta 30 Mg Cpep (Duloxetine Hcl) .Marland Kitchen.. 1 By Mouth Daili 5)  Plavix 75 Mg Tabs (Clopidogrel Bisulfate) .Marland Kitchen.. 1 By Mouth Daily 6)  Toprol Xl 50 Mg Xr24h-Tab (Metoprolol Succinate) .... One Twice A Day 7)  Spironolactone 50 Mg Tabs (Spironolactone) .... 1/2 Tablet Daily 8)  Warfarin Sodium 7.5 Mg Tabs (Warfarin Sodium) .Marland Kitchen.. 1 By Mouth As Directed 9)  Acyclovir 400 Mg Tabs (Acyclovir) .... Take 1 Tablet By Mouth Two Times A Day 10)  Furosemide 40 Mg Tabs (Furosemide) .... Take One Tablet By Mouth Daily  Allergies (verified): No Known Drug Allergies  Past History:  Past Medical History: Reviewed history from 02/17/2009 and no changes required.  1. 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   2. Palpitations with wide complex tachycardia in the past (currently       refusing ICD).     3. Previous history of ACE inhibitor intolerance.   4. Obesity.   5. Dyslexia.   6. Bifascicular block.   7. Hypertension.   8. Atrial fibrillation.   9. Hypothyroidism (status post radiation ablation of thyroid).   10. CVA  Past Surgical History: Reviewed history from 04/18/2008 and no changes required. Left knee surgery in 94 and 95  Review of Systems       As stated in the HPI and negative for all other systems.   Vital Signs:  Patient profile:   50 year old male Height:      71 inches Weight:      233 pounds BMI:     32.61 O2 Sat:      97 % on Room air Pulse rate:   72 / minute Resp:     16 per minute BP sitting:   128 / 88  (right arm)  Vitals Entered By: Marrion Coy, CNA (March 13, 2009 3:12 PM)  O2 Flow:  Room air  Physical Exam  General:  Well developed, well nourished, in no acute distress. Head:  normocephalic and atraumatic Eyes:  PERRLA/EOM intact; conjunctiva and lids normal. Mouth:  Teeth, gums and palate normal. Oral mucosa normal. Neck:  Neck supple, no JVD. No masses, thyromegaly or abnormal cervical nodes. Chest Wall:  no deformities or breast masses noted Lungs:  Clear bilaterally to auscultation and percussion. Abdomen:  Bowel sounds positive; abdomen soft  and non-tender without masses, organomegaly, or hernias noted. No hepatosplenomegaly,obese Msk:  Back normal, normal gait. Muscle strength and tone normal. Extremities:  No clubbing or cyanosis. Neurologic:  weakness noted:.   Skin:  Intact without lesions or rashes. Cervical Nodes:  no significant adenopathy Axillary Nodes:  no significant adenopathy Inguinal Nodes:  no significant adenopathy Psych:  Normal affect.   Detailed Cardiovascular Exam  Neck    Carotids: Carotids full and equal bilaterally without bruits.      Neck Veins: Normal, no JVD.    Heart    Inspection: no deformities or lifts noted.      Palpation: normal PMI with no thrills palpable.      Auscultation: S1  and S2 within normal limits, no S3, no S4, no clicks, no rubs, thrill 6 apical systolic murmur radiating slightly to the axilla, no diastolic murmurs.  Vascular    Abdominal Aorta: no palpable masses, pulsations, or audible bruits.      Femoral Pulses: normal femoral pulses bilaterally.      Pedal Pulses: normal pedal pulses bilaterally.      Radial Pulses: normal radial pulses bilaterally.      Peripheral Circulation: no clubbing, cyanosis, or edema noted with normal capillary refill.     Impression & Recommendations:  Problem # 1:  HEART FAILURE (ICD-428.9) The patient did drop his sats 86 while walking. He adamantly refuses any thought of hospitalization. He is determined to be independent. I told him he needed frequent office visits he again worried about the cost as he has done so many times in the past. However, he has been pleasant and compliant and agrees to the plan. He will increase his Lasix to 80 mg daily.  I will check a basic metabolic profile and BNP today and make a decision about adding potassium. I will add low-dose enalapril now that his blood pressure will allow a 2.5 mg b.i.d. He will need to come back early next week. I need to see him to make further adjustments. I think he will agree to call 911 if he has any worsening dyspnea.  Problem # 2:  FIBRILLATION, ATRIAL (ICD-427.31) He will continue the coumadin.  Eventually I may need to start an antiarrhythmic and plan cardioversion if he continues to have decompensated heart failure.  Other Orders: TLB-BNP (B-Natriuretic Peptide) (83880-BNPR) TLB-BMP (Basic Metabolic Panel-BMET) (80048-METABOL)  Patient Instructions: 1)  Your physician recommends that you schedule a follow-up appointment early next week 2)  Your physician recommends that you have a BMP and BNP today  427.31 786.05  3)  Your physician has recommended you make the following change in your medication: Increase Lasix to 80 mg a day,  start Enalipril 2.5 mg  twice a day 4)  You have been diagnosed with atrial fibrillation.  Atrial fibrillation is a condition in which one of the upper chambers of the heart has extra electrical cells causing it to beat very fast.  Please see the handout/brochure given to you today for further information. Prescriptions: ENALAPRIL MALEATE 2.5 MG TABS (ENALAPRIL MALEATE) one twice a day  #60 x 6   Entered by:   Charolotte Capuchin, RN   Authorized by:   [name], MD, [facility]   Signed by:   Charolotte Capuchin, RN on 03/13/2009   Method used:   Electronically to        [facility]* (retail)       [phone] [address]       [place], [place]  [address]       Ph: [phone]       Fax: [phone]   RxID:   [id number]

## 2010-02-19 NOTE — Cardiovascular Report (Signed)
Summary: Implantable Device Registration  Implantable Device Registration   Imported By: Roderic Ovens 07/04/2009 14:40:27  _____________________________________________________________________  External Attachment:    Type:   Image     Comment:   External Document

## 2010-02-19 NOTE — Medication Information (Signed)
Summary: rov/sp  Anticoagulant Therapy  Managed by: Reina Fuse, PharmD Referring MD: Dr Antoine Poche PCP: Ace Gins, MD Supervising MD: Clifton James MD, Cristal Deer Indication 1: Cerebrovascular Accident Indication 2: Atrial Fibrillation Lab Used: [facility] [place] Site: [address] INR POC 2.1 INR RANGE 2.0-3.0  Dietary changes: no    Health status changes: no    Bleeding/hemorrhagic complications: no    Recent/future hospitalizations: no    Any changes in medication regimen? no    Recent/future dental: no  Any missed doses?: yes     Details: Missed two doses this week. (Mon and Tues)  Is patient compliant with meds? yes       Current Medications (verified): 1)  Levothyroxine Sodium 75 Mcg Tabs (Levothyroxine Sodium) .Marland Kitchen.. 1and 1/2 Tablets By Mouth Daily 2)  Digoxin 0.25 Mg Tabs (Digoxin) .Marland Kitchen.. 1 By Mouth Daily 3)  Warfarin Sodium 5 Mg Tabs (Warfarin Sodium) .... One By Mouth Daily or As Directed 4)  Toprol Xl 50 Mg Xr24h-Tab (Metoprolol Succinate) .... One Twice A Day 5)  Spironolactone 50 Mg Tabs (Spironolactone) .... 1/2 Tablet Daily 6)  Warfarin Sodium 7.5 Mg Tabs (Warfarin Sodium) .Marland Kitchen.. 1 By Mouth As Directed 7)  Acyclovir 400 Mg Tabs (Acyclovir) .... Take 1 Tablet By Mouth Two Times A Day 8)  Furosemide 40 Mg Tabs (Furosemide) .... As Needed 9)  Enalapril Maleate 5 Mg Tabs (Enalapril Maleate) .... One Twice A Day 10)  Tramadol Hcl 50 Mg Tabs (Tramadol Hcl) .... One or Two Every 8 Hours As Needed For Pain 11)  Amiodarone Hcl 200 Mg Tabs (Amiodarone Hcl) .... Take One Tablet Once Daily  Allergies (verified): No Known Drug Allergies  Anticoagulation Management History:      The patient is taking warfarin and comes in today for a routine follow up visit.  Negative risk factors for bleeding include an age less than 8 years old.  The bleeding index is 'low risk'.  Positive CHADS2 values include History of CHF and History of HTN.  Negative CHADS2 values  include Age > 18 years old.  His last INR was 3.1 ratio.  Anticoagulation responsible Lajoy Vanamburg: Clifton James MD, Cristal Deer.  INR POC: 2.1.  Cuvette Lot#: [id number].  Exp: 10/2010.    Anticoagulation Management Assessment/Plan:      The patient's current anticoagulation dose is Warfarin sodium 5 mg tabs: one by mouth daily or as directed, Warfarin sodium 7.5 mg tabs: 1 by mouth as directed.  The target INR is 2.0-3.0.  The next INR is due 11/12/2009.  Anticoagulation instructions were given to patient.  Results were reviewed/authorized by Reina Fuse, PharmD.  He was notified by Reina Fuse PharmD.         Prior Anticoagulation Instructions: INR 1.5  Take an extra 1/2 tablet today and tomorrow (total of 2 tablets) then resume same dose of 1 1/2 tablets every day except 1 tablet on Tuesday and Saturday.  Recheck INR in 2 weeks.   Current Anticoagulation Instructions: INR 2.1  Continue taking Coumadin 7.5 mg (1.5 tabs) on all days except Coumadin 5 mg (1 tab) on Tuesdays and Saturdays.  Return to clinic in 3 weeks.

## 2010-02-19 NOTE — Progress Notes (Signed)
Summary: PAIN IN LEFT ARM  Phone Note Call from Patient Call back at [phone]   Caller: Mom/shirley Summary of Call: PT MOTHER CALLING REGARDING THE PT HAVING ALOT OF PAIN LEFT ARM. Initial call taken by: Judie Grieve,  March 19, 2009 8:05 AM  Follow-up for Phone Call        pt has bruising under right arm and it hurts horrible when he has to move it, bruise noticed last pm but it has been sore since last pm pain mainly in shoulder, she states pt has not bumped it or had any injury, she states bruise is the size of a softball and is under is arm, she is concerned, discussed w/cvrr they will see pt this afternoon to recheck INR Meredith Staggers, RN  March 19, 2009 8:40 AM

## 2010-02-19 NOTE — Assessment & Plan Note (Signed)
Summary: f/u sob  chf   Visit Type:  Follow-up  CC:  CHF.  History of Present Illness: The patient presents for followup of his cardiomyopathy.  At the last visit he was volume overloaded. I increased his Lasix to 80 mg daily and added enalapril 2.5 mg b.i.d. He feels much better today. He is having much less dyspnea and is able to lie flatter in the bed.  He is having less dyspnea walking across the room. He's lost 4 pounds. He's had no swelling. He has not noticed palpitations and has had no chest discomfort, neck or arm discomfort.  Current Medications (verified): 1)  Levothyroxine Sodium 75 Mcg Tabs (Levothyroxine Sodium) .Marland Kitchen.. 1 By Mouth Daily 2)  Digoxin 0.25 Mg Tabs (Digoxin) .Marland Kitchen.. 1 By Mouth Daily 3)  Warfarin Sodium 5 Mg Tabs (Warfarin Sodium) .... One By Mouth Daily or As Directed 4)  Cymbalta 30 Mg Cpep (Duloxetine Hcl) .Marland Kitchen.. 1 By Mouth Daili 5)  Plavix 75 Mg Tabs (Clopidogrel Bisulfate) .Marland Kitchen.. 1 By Mouth Daily 6)  Toprol Xl 50 Mg Xr24h-Tab (Metoprolol Succinate) .... One Twice A Day 7)  Spironolactone 50 Mg Tabs (Spironolactone) .... 1/2 Tablet Daily 8)  Warfarin Sodium 7.5 Mg Tabs (Warfarin Sodium) .Marland Kitchen.. 1 By Mouth As Directed 9)  Acyclovir 400 Mg Tabs (Acyclovir) .... Take 1 Tablet By Mouth Two Times A Day 10)  Furosemide 40 Mg Tabs (Furosemide) .... 2 By Mouth Daily 11)  Enalapril Maleate 2.5 Mg Tabs (Enalapril Maleate) .... One Twice A Day  Allergies (verified): No Known Drug Allergies  Past History:  Past Medical History: Reviewed history from 02/17/2009 and no changes required.  1. 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   2. Palpitations with wide complex tachycardia in the past (currently       refusing ICD).   3. Previous history of ACE inhibitor intolerance.   4. Obesity.   5. Dyslexia.   6. Bifascicular block.   7. Hypertension.   8. Atrial fibrillation.   9. Hypothyroidism (status post radiation ablation of thyroid).   10. CVA  Past Surgical  History: Reviewed history from 04/18/2008 and no changes required. Left knee surgery in 94 and 95  Review of Systems       As stated in the HPI and negative for all other systems.   Vital Signs:  Patient profile:   50 year old male Height:      71 inches Weight:      230 pounds BMI:     32.19 Pulse rate:   63 / minute Resp:     18 per minute BP sitting:   104 / 76  (right arm)  Vitals Entered By: Marrion Coy, CNA (March 17, 2009 4:06 PM)  Physical Exam  General:  Well developed, well nourished, in no acute distress. Head:  normocephalic and atraumatic Eyes:  PERRLA/EOM intact; conjunctiva and lids normal. Mouth:  Teeth, gums and palate normal. Oral mucosa normal. Neck:  Neck supple, no JVD. No masses, thyromegaly or abnormal cervical nodes. Chest Wall:  no deformities or breast masses noted Lungs:  Clear bilaterally to auscultation and percussion. Abdomen:  Bowel sounds positive; abdomen soft and non-tender without masses, organomegaly, or hernias noted. No hepatosplenomegaly,obese Msk:  Back normal, normal gait. Muscle strength and tone normal. Extremities:  No clubbing or cyanosis. Neurologic:  weakness noted:.   Skin:  Intact without lesions or rashes. Psych:  Normal affect.   Detailed Cardiovascular Exam  Neck    Carotids: Carotids  full and equal bilaterally without bruits.      Neck Veins: Normal, no JVD.    Heart    Inspection: no deformities or lifts noted.      Palpation: normal PMI with no thrills palpable.      Auscultation: S1 and S2 within normal limits, no S3, no S4, no clicks, no rubs, thrill 6 apical systolic murmur radiating slightly to the axilla, no diastolic murmurs.  Vascular    Abdominal Aorta: no palpable masses, pulsations, or audible bruits.      Femoral Pulses: normal femoral pulses bilaterally.      Pedal Pulses: normal pedal pulses bilaterally.      Radial Pulses: normal radial pulses bilaterally.      Peripheral Circulation: no  clubbing, cyanosis, or edema noted with normal capillary refill.     Impression & Recommendations:  Problem # 1:  HEART FAILURE (ICD-428.9) He is much improved. He will continue on the current regimen. I will check another basic metabolic profile. I will try to titrate his meds in the future though his blood pressure is somewhat low. Orders: TLB-BMP (Basic Metabolic Panel-BMET) (80048-METABOL)  Problem # 2:  FIBRILLATION, ATRIAL (ICD-427.31) Ultimately I would like to try to get this patient in normal rhythm. For now we will continue anticoagulation and rate control. Of note he remains on Plavix because of his cerebral stent. I have asked him to discuss this at his upcoming neurology appointment as they should be discontinued when possible to minimize his bleeding risk.  Problem # 3:  HYPERTENSION (ICD-401.9) HIs blood pressure is low and will be a limiting factor in managing his cardiomyopathy. Orders: TLB-BMP (Basic Metabolic Panel-BMET) (80048-METABOL)  Patient Instructions: 1)  Your physician recommends that you schedule a follow-up appointment in: 6 weeks with Dr Antoine Poche 2)  Your physician recommends that you continue on your current medications as directed. Please refer to the Current Medication list given to you today. 3)  You have been diagnosed with Congestive Heart Failure or CHF.  CHF is a condition in which a problem with the structure or function of the heart impairs its ability to supply sufficient blood flow to meet the body's needs.  For further information please visit www.cardiosmart.org for detailed information on CHF. 4)  Your physician recommends that you weigh, daily, at the same time every day, and in the same amount of clothing.  Please record your daily weights on the handout provided and bring it to your next appointment.

## 2010-02-19 NOTE — Progress Notes (Signed)
Summary: HAVING NAUSEA,VOMITING,HEADACHE AND SOB  Phone Note Call from Patient Call back at [phone]   Caller: Mom/SHIRLEY DREW Summary of Call: PT MOTHER CALLING  REGARDING HAVING  NAUSEA,VOMITING, HEADACHE,SOME SOB Initial call taken by: Judie Grieve,  February 24, 2009 12:53 PM  Follow-up for Phone Call        N/V DUE TO FEELING LIKE HE IS CHOKING, NO SORE THROAT, NO NEW WEAKNESS,  HEADACHE, HAVING TO SIT STRAIGHT UP/HASNT SLEPT IN 2 DAYS DUE TO SOB, HASN'T TAKEN by mouth MEDICATIONS TODAY D/T CHOKING FEELING.  HEADACHE ABOUT A 7 ON A SCALE FROM ONE TO TEN.  NO INDURANCE AND GETS SOB VERY EASY. GIVEN APPT FOR TODAY WITH DR Excell Seltzer AT 2:45 BUT INSTRUCTED PT'S MOTHER TO TAKE HIM TO THE ED IF S/S WORSEN Follow-up by: Charolotte Capuchin, RN,  February 24, 2009 1:11 PM

## 2010-02-19 NOTE — Miscellaneous (Signed)
  Clinical Lists Changes  Observations: Added new observation of ECHOINTERP:  1. Left ventricle: The cavity size was moderately dilated. Wall      thickness was normal. Systolic function was severely reduced. The      estimated ejection fraction was 15%. Diffuse hypokinesis.   2. Mitral valve: Calcified annulus. Mild regurgitation.   3. Left atrium: The atrium was moderately dilated.   4. Right ventricle: Systolic function was mildly reduced.   5. Pulmonary arteries: Systolic pressure was mildly increased. PA      peak pressure: 41mm Hg (S).  (11/19/2008 10:00)      Echocardiogram  Procedure date:  11/19/2008  Findings:       1. Left ventricle: The cavity size was moderately dilated. Wall      thickness was normal. Systolic function was severely reduced. The      estimated ejection fraction was 15%. Diffuse hypokinesis.   2. Mitral valve: Calcified annulus. Mild regurgitation.   3. Left atrium: The atrium was moderately dilated.   4. Right ventricle: Systolic function was mildly reduced.   5. Pulmonary arteries: Systolic pressure was mildly increased. PA      peak pressure: 41mm Hg (S).

## 2010-02-19 NOTE — Medication Information (Signed)
Summary: rov/sp  Anticoagulant Therapy  Managed by: Weston Brass, PharmD Referring MD: Dr Antoine Poche PCP: Ace Gins, MD Supervising MD: Juanda Chance MD, Bruce Indication 1: Cerebrovascular Accident Indication 2: Atrial Fibrillation Lab Used: [facility] [place] Site: [address] INR POC 3.0 INR RANGE 2.0-3.0  Dietary changes: no    Health status changes: yes       Details: Pt reports headaches with sharpe pain.  They begin each afternoon when he talks on the phone for an extened amount of time.  He takes 6 Tylenol daily to relieve the pain but the headache last until he goes to bed..  Bleeding/hemorrhagic complications: no    Recent/future hospitalizations: yes       Details: Pt had pacemaker placed over a month ago- bruising around entry site still present.  Any changes in medication regimen? no    Recent/future dental: no  Any missed doses?: no       Is patient compliant with meds? yes       Allergies: No Known Drug Allergies  Anticoagulation Management History:      The patient is taking warfarin and comes in today for a routine follow up visit.  Negative risk factors for bleeding include an age less than 35 years old.  The bleeding index is 'low risk'.  Positive CHADS2 values include History of CHF and History of HTN.  Negative CHADS2 values include Age > 14 years old.  His last INR was 3.1 ratio.  Anticoagulation responsible provider: Juanda Chance MD, Smitty Cords.  INR POC: 3.0.  Cuvette Lot#: [id number].  Exp: 10/2010.    Anticoagulation Management Assessment/Plan:      The patient's current anticoagulation dose is Warfarin sodium 5 mg tabs: one by mouth daily or as directed, Warfarin sodium 7.5 mg tabs: 1 by mouth as directed.  The target INR is 2.0-3.0.  The next INR is due 10/01/2009.  Anticoagulation instructions were given to patient.  Results were reviewed/authorized by Weston Brass, PharmD.  He was notified by Gweneth Fritter, PharmD Candidate.         Prior  Anticoagulation Instructions: INR 2.6  Continue same dose of 1 1/2 tablets every day except 1 tablet on Tuesday and Saturday.   Current Anticoagulation Instructions: INR 3.0  Continue taking 1.5 tablets (7.5mg ) every day except take 1 tablet (5mg ) on Tuesdays and Saturdays.  Recheck in 4 weeks.

## 2010-02-19 NOTE — Medication Information (Signed)
Summary: rov/tm  Anticoagulant Therapy  Managed by: Weston Brass, PharmD Referring MD: Dr Su Monks MD: Juanda Chance MD, Braxon Suder Indication 1: Cerebrovascular Accident Lab Used: [facility] [place] Site: [address] INR POC 1.7 INR RANGE 2.0-3.0  Dietary changes: no    Health status changes: no    Bleeding/hemorrhagic complications: no    Recent/future hospitalizations: no    Any changes in medication regimen? no    Recent/future dental: no  Any missed doses?: yes     Details: may have missed 2 doses of Coumadin over the weekend- he skipped his morning medicines but took his night medicines- not sure if Coumadin is a morning or night medication; he has a friend who does his medication box   Is patient compliant with meds? yes       Allergies: No Known Drug Allergies  Anticoagulation Management History:      The patient is taking warfarin and comes in today for a routine follow up visit.  Negative risk factors for bleeding include an age less than 32 years old.  The bleeding index is 'low risk'.  Positive CHADS2 values include History of CHF and History of HTN.  Negative CHADS2 values include Age > 95 years old.  His last INR was 1.0 ratio.  Anticoagulation responsible provider: Juanda Chance MD, Smitty Cords.  INR POC: 1.7.  Cuvette Lot#: [id number].  Exp: 05/2010.    Anticoagulation Management Assessment/Plan:      The patient's current anticoagulation dose is Warfarin sodium 5 mg tabs: one by mouth daily or as directed, Warfarin sodium 7.5 mg tabs: 1 by mouth as directed.  The target INR is 2.0-3.0.  The next INR is due 05/19/2009.  Anticoagulation instructions were given to patient.  Results were reviewed/authorized by Weston Brass, PharmD.  He was notified by Weston Brass PharmD.         Prior Anticoagulation Instructions: INR 1.7. Take 2 tablets today, then take 1.5 tablets daily.  Recheck 05/05/09 at 10:15.  Current Anticoagulation Instructions: INR 1.7  Increase dose  to 1 1/2 tablets every day except 2 tablets on Monday

## 2010-02-19 NOTE — Medication Information (Signed)
Summary: rov/sp  Anticoagulant Therapy  Managed by: Loma Newton, PharmD Referring MD: Dr Antoine Poche PCP: Ace Gins, MD Supervising MD: Jens Som MD, Arlys John Indication 1: Cerebrovascular Accident Indication 2: Atrial Fibrillation Lab Used: [facility] [place] Site: [address] INR POC 3.0 INR RANGE 2.0-3.0   Health status changes: yes       Details: Has been nauseas and throwing up, not eating as much  Bleeding/hemorrhagic complications: no    Recent/future hospitalizations: no    Any changes in medication regimen? no    Recent/future dental: no  Any missed doses?: no       Is patient compliant with meds? yes      Comments: Pt states he did not throw up any of his coumadin doses  Current Medications (verified): 1)  Levothyroxine Sodium 75 Mcg Tabs (Levothyroxine Sodium) .Marland Kitchen.. 1 By Mouth Daily 2)  Digoxin 0.25 Mg Tabs (Digoxin) .Marland Kitchen.. 1 By Mouth Daily 3)  Warfarin Sodium 5 Mg Tabs (Warfarin Sodium) .... One By Mouth Daily or As Directed 4)  Cymbalta 30 Mg Cpep (Duloxetine Hcl) .Marland Kitchen.. 1 By Mouth Daili 5)  Toprol Xl 50 Mg Xr24h-Tab (Metoprolol Succinate) .... One Twice A Day 6)  Spironolactone 50 Mg Tabs (Spironolactone) .... 1/2 Tablet Daily 7)  Warfarin Sodium 7.5 Mg Tabs (Warfarin Sodium) .Marland Kitchen.. 1 By Mouth As Directed 8)  Acyclovir 400 Mg Tabs (Acyclovir) .... Take 1 Tablet By Mouth Two Times A Day 9)  Furosemide 40 Mg Tabs (Furosemide) .... 2 By Mouth Daily-Hold 10)  Enalapril Maleate 5 Mg Tabs (Enalapril Maleate) .... One Twice A Day 11)  Aspirin 81 Mg  Tabs (Aspirin) .Marland Kitchen.. 1 By Mouth Daily 12)  Tramadol Hcl 50 Mg Tabs (Tramadol Hcl) .... One or Two Every 8 Hours As Needed For Pain 13)  Amiodarone Hcl 200 Mg Tabs (Amiodarone Hcl) .... Take One Tablet Once Daily  Allergies (verified): No Known Drug Allergies  Anticoagulation Management History:      The patient is taking warfarin and comes in today for a routine follow up visit.  Negative risk factors  for bleeding include an age less than 16 years old.  The bleeding index is 'low risk'.  Positive CHADS2 values include History of CHF and History of HTN.  Negative CHADS2 values include Age > 68 years old.  His last INR was 1.0 ratio and today's INR is 3.0.  Anticoagulation responsible provider: Jens Som MD, Arlys John.  INR POC: 3.0.  Cuvette Lot#: [id number].  Exp: 08/19/2010.    Anticoagulation Management Assessment/Plan:      The patient's current anticoagulation dose is Warfarin sodium 5 mg tabs: one by mouth daily or as directed, Warfarin sodium 7.5 mg tabs: 1 by mouth as directed.  The target INR is 2.0-3.0.  The next INR is due 08/04/2009.  Anticoagulation instructions were given to patient.  Results were reviewed/authorized by Loma Newton, PharmD.  He was notified by Loma Newton.         Prior Anticoagulation Instructions: INR 2.4  Continue same dose of 1 1/2 tablets every day except 1 tablet on Tuesday and Saturday.   Current Anticoagulation Instructions: INR = 3.0  The patient is to continue with the same dose of coumadin.  This dosage includes: 1.5 tablets on all days except for tuesdays and saturdays take 1 tablet.

## 2010-02-19 NOTE — Medication Information (Signed)
Summary: rov-tp  Anticoagulant Therapy  Managed by: Elaina Pattee, PharmD Referring MD: Dr Antoine Poche PCP: Ace Gins, MD Supervising MD: Riley Kill MD, Maisie Fus Indication 1: Cerebrovascular Accident Indication 2: Atrial Fibrillation Lab Used: [facility] [place] Site: [address] INR POC 1.1 INR RANGE 2.0-3.0  Dietary changes: no    Health status changes: no    Bleeding/hemorrhagic complications: no    Recent/future hospitalizations: yes       Details: Pt DC on 06/13/09 from [facility] for insertion of [employer] AICD.  Any changes in medication regimen? yes       Details: Started Amiodarone during hospital admission.  Recent/future dental: no  Any missed doses?: no       Is patient compliant with meds? yes      Comments: INR was up to 3.99 on 5/26 during admission.  Amiodarone started 5/24, along with cefazolin, which has been associated with INR elevations.  Pt had hematoma in ICD pocket and had to undergo surgery to correct.  Coumadin was held until Sunday 06/15/09, then he started Coumadin 5 mg daily. St  Allergies: No Known Drug Allergies  Anticoagulation Management History:      The patient is taking warfarin and comes in today for a routine follow up visit.  Negative risk factors for bleeding include an age less than 54 years old.  The bleeding index is 'low risk'.  Positive CHADS2 values include History of CHF and History of HTN.  Negative CHADS2 values include Age > 25 years old.  His last INR was 1.0 ratio.  Anticoagulation responsible provider: Riley Kill MD, Maisie Fus.  INR POC: 1.1.  Cuvette Lot#: [id number].  Exp: 08/19/2010.    Anticoagulation Management Assessment/Plan:      The patient's current anticoagulation dose is Warfarin sodium 5 mg tabs: one by mouth daily or as directed, Warfarin sodium 7.5 mg tabs: 1 by mouth as directed.  The target INR is 2.0-3.0.  The next INR is due 06/25/2009.  Anticoagulation instructions were given to patient.  Results  were reviewed/authorized by Elaina Pattee, PharmD.  He was notified by Elaina Pattee, PharmD.         Prior Anticoagulation Instructions: The patient is to continue with the same dose of coumadin.  This dosage includes: 7.5mg  daily except 10mg  on Mon and Wed.  Current Anticoagulation Instructions: INR 1.1. Take 1.5 tablets daily except 1 tablet on Tues and Sat.  Recheck in 1 week.

## 2010-02-19 NOTE — Cardiovascular Report (Signed)
Summary: Office Visit   Office Visit   Imported By: Roderic Ovens 07/04/2009 14:39:14  _____________________________________________________________________  External Attachment:    Type:   Image     Comment:   External Document

## 2010-02-19 NOTE — Medication Information (Signed)
Summary: rov/tm  Anticoagulant Therapy  Managed by: Weston Brass, PharmD Referring MD: Dr Antoine Poche PCP: Ace Gins, MD Supervising MD: Myrtis Ser MD, Tinnie Gens Indication 1: Cerebrovascular Accident Indication 2: Atrial Fibrillation Lab Used: [facility] [place] Site: [address] INR POC 1.1 INR RANGE 2.0-3.0  Dietary changes: yes       Details: increased salads  Health status changes: no    Bleeding/hemorrhagic complications: no    Recent/future hospitalizations: no    Any changes in medication regimen? yes       Details: increased synthroid  Recent/future dental: no  Any missed doses?: yes     Details: states may have missed 1 or 2 doses  Is patient compliant with meds? yes       Allergies: No Known Drug Allergies  Anticoagulation Management History:      The patient is taking warfarin and comes in today for a routine follow up visit.  Negative risk factors for bleeding include an age less than 43 years old.  The bleeding index is 'low risk'.  Positive CHADS2 values include History of CHF and History of HTN.  Negative CHADS2 values include Age > 26 years old.  His last INR was 3.1 ratio.  Anticoagulation responsible provider: Myrtis Ser MD, Tinnie Gens.  INR POC: 1.1.  Cuvette Lot#: [id number].  Exp: 12/2010.    Anticoagulation Management Assessment/Plan:      The patient's current anticoagulation dose is Warfarin sodium 5 mg tabs: one by mouth daily or as directed, Warfarin sodium 7.5 mg tabs: 1 by mouth as directed.  The target INR is 2.0-3.0.  The next INR is due 12/12/2009.  Anticoagulation instructions were given to patient.  Results were reviewed/authorized by Weston Brass, PharmD.  He was notified by Weston Brass PharmD.         Prior Anticoagulation Instructions: INR 1.5 Today take 2 pills and Thursday take 2 pills then  resume 1 1/2 pills everyday except 1 pill on Tuesdays and Saturdays. Recheck in 2 weeks.   Current Anticoagulation Instructions: INR 1.1  Take 2  tablets today and tomorrow then increase dose to 1 1/2 tablets every day.  Recheck INR in 1 week.

## 2010-02-19 NOTE — Medication Information (Signed)
Summary: ROV/SP  Anticoagulant Therapy  Managed by: Weston Brass, PharmD Referring MD: Dr Antoine Poche PCP: Ace Gins, MD Supervising MD: Johney Frame MD, Fayrene Fearing Indication 1: Cerebrovascular Accident Indication 2: Atrial Fibrillation Lab Used: [facility] [place] Site: [address] INR POC 2.6 INR RANGE 2.0-3.0  Dietary changes: no    Health status changes: yes       Details: Dizziness spell yesterday, has happened twice before   Bleeding/hemorrhagic complications: no    Recent/future hospitalizations: no    Any changes in medication regimen? no    Recent/future dental: no  Any missed doses?: no       Is patient compliant with meds? yes       Allergies: No Known Drug Allergies  Anticoagulation Management History:      The patient is taking warfarin and comes in today for a routine follow up visit.  Negative risk factors for bleeding include an age less than 37 years old.  The bleeding index is 'low risk'.  Positive CHADS2 values include History of CHF and History of HTN.  Negative CHADS2 values include Age > 16 years old.  His last INR was 3.1 ratio.  Anticoagulation responsible provider: Jina Olenick MD, Fayrene Fearing.  INR POC: 2.6.  Cuvette Lot#: [id number].  Exp: 02/2011.    Anticoagulation Management Assessment/Plan:      The patient's current anticoagulation dose is Warfarin sodium 5 mg tabs: one by mouth daily or as directed, Warfarin sodium 7.5 mg tabs: 1 by mouth as directed.  The target INR is 2.0-3.0.  The next INR is due 02/18/2010.  Anticoagulation instructions were given to patient.  Results were reviewed/authorized by Weston Brass, PharmD.  He was notified by Stephannie Peters, PharmD Candidate .         Prior Anticoagulation Instructions: INR 1.2   Coumadin 7.5 mg - Today take 2 tablets. Then take 1.5 tablets daily except for 1 tablet on Tuesday, Thursdays, and Saturdays.   Current Anticoagulation Instructions: INR 2.6  Coumadin 7.5 mg tablets - Continue to take  1.5 tablet every day, except 1 tablet on Tuesdays, Thursdays, and Saturdays

## 2010-02-19 NOTE — Progress Notes (Signed)
Summary: Drainage and stinging at site  Phone Note Call from Patient Call back at Home Phone [phone]   Caller: Mom/Shirley Summary of Call: Pt still having drainage and stinging  at site Initial call taken by: Judie Grieve,  June 30, 2009 8:54 AM  Follow-up for Phone Call        Had a pacer & defibrillator placed about 3 weeks ago.  Has been draining since placement.  During the past 3 weeks he also has a stinging sensation.  Was told if draining didn't stop over the weekend to call the office or go to ED.  The drainage was a dark gray/black color on Saturday & he said it's been that color the whole time. Made appt for him to see the PA today at 11:30 AM.  He verbalized understanding of this appointment. Follow-up by: Minerva Areola, RN, BSN,  June 30, 2009 9:02 AM     Appended Document: Drainage and stinging at site Appt is for 11:15, not 11:30 AM.  Pt aware.

## 2010-02-19 NOTE — Letter (Signed)
Summary: Handout Printed  Printed Handout:  - Coumadin Instructions-w/out Meds 

## 2010-02-19 NOTE — Assessment & Plan Note (Signed)
Summary: per check out with pa/ok per amber/saf   Primary Provider:  Ace Gins, MD  CC:  wound check..  History of Present Illness: b Raymond [name] is seen in followup for syncope in the setting of nonischemic cardiomyopathy and congestive heart failure atrial fibrillation that has been long-standing and persistent complicated by a stroke .  He is status post CRT-D implantation.  He continues to have some complaints of shortness of breath fatigue as well as tachypalpitations. There is minimal peripheral edema.  Current Medications (verified): 1)  Levothyroxine Sodium 75 Mcg Tabs (Levothyroxine Sodium) .Marland Kitchen.. 1 By Mouth Daily 2)  Digoxin 0.25 Mg Tabs (Digoxin) .Marland Kitchen.. 1 By Mouth Daily 3)  Warfarin Sodium 5 Mg Tabs (Warfarin Sodium) .... One By Mouth Daily or As Directed 4)  Toprol Xl 50 Mg Xr24h-Tab (Metoprolol Succinate) .... One Twice A Day 5)  Spironolactone 50 Mg Tabs (Spironolactone) .... 1/2 Tablet Daily 6)  Warfarin Sodium 7.5 Mg Tabs (Warfarin Sodium) .Marland Kitchen.. 1 By Mouth As Directed 7)  Acyclovir 400 Mg Tabs (Acyclovir) .... Take 1 Tablet By Mouth Two Times A Day 8)  Furosemide 40 Mg Tabs (Furosemide) .... 2 By Mouth Daily-Hold 9)  Enalapril Maleate 5 Mg Tabs (Enalapril Maleate) .... One Twice A Day 10)  Aspirin 81 Mg  Tabs (Aspirin) .Marland Kitchen.. 1 By Mouth Daily 11)  Tramadol Hcl 50 Mg Tabs (Tramadol Hcl) .... One or Two Every 8 Hours As Needed For Pain 12)  Amiodarone Hcl 200 Mg Tabs (Amiodarone Hcl) .... Take One Tablet Once Daily  Allergies (verified): No Known Drug Allergies  Past History:  Past Medical History: Last updated: 05/05/2009  1. 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   2. Palpitations with wide complex tachycardia in the past (currently       refusing ICD).   3. Previous history of ACE inhibitor intolerance.   4. Obesity.   5. Dyslexia.   6. Bifascicular block.   7. Hypertension.   8. Atrial fibrillation.   9. Hypothyroidism (status post radiation  ablation of thyroid).   10. 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   There is no family history of cardiomyopathy.      Social History: Last updated: 04/18/2008 The patient is divorced, he repairs cars.  He does not   have children and he does not smoke and does not drink alcohol.   Vital Signs:  Patient profile:   50 year old male Height:      71 inches Weight:      240 pounds BMI:     33.59 Pulse rate:   71 / minute Pulse rhythm:   regular BP sitting:   106 / 70  (left arm) Cuff size:   large  Vitals Entered By: Judithe Modest CMA (July 14, 2009 2:05 PM)  Physical Exam  General:   Well-nournished, in no acute distress. The patient was alert and oriented in no acute distress.Neck veins were flat, carotids were brisk. Lungs were clear. Heart sounds were irregular without murmurs or gallops. Abdomen was soft with active bowel sounds. There is no clubbing cyanosis ; trace edema. There is a paresis  iof his his left upper extremity. his device pocket is well-healed  ICD Specifications Following MD:  Sherryl Manges, MD     ICD Vendor:  [facility] [employer]     ICD Model Number:  [id number]     ICD Serial Number:  [id number] ICD DOI:  06/09/2009     ICD Implanting MD:  Sherryl Manges, MD  Lead 1:    Location: RA     DOI: 06/09/2009     Model #: [id number]     Serial #: [id number]     Status: active Lead 2:    Location: RV     DOI: 06/09/2009     Model #: [id number]     Serial #: [id number]     Status: active Lead 3:    Location: LV     DOI: 06/09/2009     Model #: [id number]     Serial #: [id number]     Status: active  Indications::  CM   Brady Parameters Mode DDD     Lower Rate Limit:  60     Upper Rate Limit 130 PAV 180     Sensed AV  Delay:  160  Tachy Zones VF:  222     Impression & Recommendations:  Problem # 1:  AUTOMATIC IMPLANTABLE CARDIAC DEFIBRILLATOR SITU (ICD-V45.02) Device parameters and data were reviewed and no changes were made  is evidence of rapid atrial fibrillation: No intercurrent ventricular arrhythmia  Problem # 2:  FIBRILLATION, ATRIAL (ICD-427.31) the patient's atrial fibrillation is persistent with a rapid ventricular response. He has been loaded with amiodarone. Will plan to attempt cardioversion. His atrial dimension is 5.5 by echo in November 2010. This does not leave me optimistic that we will be able to maintain and restore sinus rhythm even in the presence of amiodarone but I think it is his best chance. In the event that this fails he may well be a candidate for AV junction ablation  Problem # 3:  HYPOTHYROIDISM (ICD-244.9) he needs a TSH repeated given the recent introduction of amiodarone His updated medication list for this problem includes:    Levothyroxine Sodium 75 Mcg Tabs (Levothyroxine sodium) .Marland Kitchen... 1 by mouth daily  Problem # 4:  HEART FAILURE (ICD-428.9) stable on current meds

## 2010-02-19 NOTE — Miscellaneous (Signed)
Summary: added cxr  Clinical Lists Changes  Observations: Added new observation of CXR RESULTS:    IMPRESSION:   No acute cardiopulmonary findings.  (07/27/2009 15:09)      CXR  Procedure date:  07/27/2009  Findings:         IMPRESSION:   No acute cardiopulmonary findings.

## 2010-02-19 NOTE — Miscellaneous (Signed)
Summary: Orders Update  Clinical Lists Changes  Medications: Rx of SPIRONOLACTONE 50 MG TABS (SPIRONOLACTONE) 1/2 tablet daily;  #30 x 6;  Signed;  Entered by: Charolotte Capuchin, RN;  Authorized by: [name], MD, [facility];  Method used: Electronically to [facility] [address] [place], [place]  [address], Ph: [phone], Fax: [phone] Rx of TOPROL XL 50 MG XR24H-TAB (METOPROLOL SUCCINATE) one twice a day;  #60 x 6;  Signed;  Entered by: Charolotte Capuchin, RN;  Authorized by: [name], MD, [facility];  Method used: Electronically to [facility] [address] [place], [place]  [address], Ph: [phone], Fax: [phone]    Prescriptions: TOPROL XL 50 MG XR24H-TAB (METOPROLOL SUCCINATE) one twice a day  #60 x 6   Entered by:   Charolotte Capuchin, RN   Authorized by:   [name], MD, [facility]   Signed by:   Charolotte Capuchin, RN on 02/17/2009   Method used:   Electronically to        [facility]* (retail)       [address]       [place], [place]  [address]       Ph: [phone]       Fax: [phone]   RxID:   [id number] SPIRONOLACTONE 50 MG TABS (SPIRONOLACTONE) 1/2 tablet daily  #30 x 6   Entered by:   Charolotte Capuchin, RN   Authorized by:   [name], MD, [facility]   Signed by:   Charolotte Capuchin, RN on 02/17/2009   Method used:   Electronically to        [facility]* (retail)       [address]       [place], [place]  [address]       Ph: [phone]       Fax: [phone]   RxID:   [id number]

## 2010-02-19 NOTE — Assessment & Plan Note (Signed)
Summary: 2 MONTH/DMP   Visit Type:  Follow-up  CC:  Cardiomyopathy/atrial fibrillation.  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 This was related to atrial fibrillation and a subtherapeutic INR. He has completed rehabilitation and is actually living independently now. He has recovered nicely ambulating with a cane though he still has some left lower extremity weakness and right upper extremity paresis. Living alone does present with some problems as he has difficulty reading and therefore this may be contributing to difficulty keeping appointments. He has not been seen in the Coumadin clinic since leaving rehabilitation. I do believe he is taking his medicines correctly. However, we have to be very careful when giving him a change in direction. He does get short of breath when he ambulates for longer than 15 minutes. He is sleeping on about 3 pillows. He feels his heart racing. He is not having any chest pressure, neck discomfort or arm discomfort. He is not having any cough, swelling or weight gain.  Current Medications (verified): 1)  Levothyroxine Sodium 75 Mcg Tabs (Levothyroxine Sodium) .Marland Kitchen.. 1 By Mouth Daily 2)  Digoxin 0.25 Mg Tabs (Digoxin) .Marland Kitchen.. 1 By Mouth Daily 3)  Atacand 16 Mg Tabs (Candesartan Cilexetil) .... One By Mouth Daily 4)  Warfarin Sodium 5 Mg Tabs (Warfarin Sodium) .... One By Mouth Daily or As Directed 5)  Cymbalta 30 Mg Cpep (Duloxetine Hcl) .Marland Kitchen.. 1 By Mouth Daili 6)  Plavix 75 Mg Tabs (Clopidogrel Bisulfate) .Marland Kitchen.. 1 By Mouth Daily 7)  Toprol Xl 50 Mg Xr24h-Tab (Metoprolol Succinate) .... One Twice A Day 8)  Spironolactone 50 Mg Tabs (Spironolactone) .... 1/2 Tablet Daily 9)  Acyclovir 200 Mg Caps (Acyclovir) .... 2 By Mouth Two Times A Day 10)  Warfarin Sodium 7.5 Mg Tabs (Warfarin Sodium) .Marland Kitchen.. 1 By Mouth As Directed  Allergies (verified): No Known Drug Allergies  Past History:  Past Medical  History:  1. 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   2. Palpitations with wide complex tachycardia in the past (currently       refusing ICD).   3. Previous history of ACE inhibitor intolerance.   4. Obesity.   5. Dyslexia.   6. Bifascicular block.   7. Hypertension.   8. Atrial fibrillation.   9. Hypothyroidism (status post radiation ablation of thyroid).   10. CVA  Review of Systems       As stated in the HPI and negative for all other systems.   Vital Signs:  Patient profile:   50 year old male Height:      71 inches Weight:      240 pounds BMI:     33.59 Pulse rate:   142 / minute Resp:     16 per minute BP sitting:   112 / 80  (right arm)  Vitals Entered By: Marrion Coy, CNA (February 17, 2009 4:23 PM)  Physical Exam  General:  Well developed, well nourished, in no acute distress. Head:  normocephalic and atraumatic Eyes:  PERRLA/EOM intact; conjunctiva and lids normal. Mouth:  Teeth, gums and palate normal. Oral mucosa normal. Neck:  Neck supple, no JVD. No masses, thyromegaly or abnormal cervical nodes. Chest Wall:  no deformities or breast masses noted Lungs:  Clear bilaterally to auscultation and percussion. Abdomen:  Bowel sounds positive; abdomen soft and non-tender without masses, organomegaly, or hernias noted. No hepatosplenomegaly,obese Msk:  Back normal, normal gait. Muscle strength and tone normal. Extremities:  No clubbing or cyanosis. Neurologic:  weakness noted:.   Skin:  Intact without lesions or rashes. Cervical Nodes:  no significant adenopathy Axillary Nodes:  no significant adenopathy Inguinal Nodes:  no significant adenopathy Psych:  Normal affect.   Detailed Cardiovascular Exam  Neck    Carotids: Carotids full and equal bilaterally without bruits.      Neck Veins: Normal, no JVD.    Heart    Inspection: no deformities or lifts noted.      Palpation: normal PMI with no thrills palpable.      Auscultation: irregular  rhythm, no murmurs, no rubs, and no gallops.    Vascular    Abdominal Aorta: no palpable masses, pulsations, or audible bruits.      Femoral Pulses: normal femoral pulses bilaterally.      Pedal Pulses: normal pedal pulses bilaterally.      Radial Pulses: normal radial pulses bilaterally.      Peripheral Circulation: no clubbing, cyanosis, or edema noted with normal capillary refill.     EKG  Procedure date:  02/17/2009  Findings:      Atrial fibrillation, a right bundle branch block, left anterior fascicular block, rapid ventricular rate  Impression & Recommendations:  Problem # 1:  FIBRILLATION, ATRIAL (ICD-427.31) I have stressed to him the importance of complying with his Coumadin checks. It was checked and adjusted today. Also we need to work aggressively on rate control. Toward that end I will increase his metoprolol to 50 mg b.i.d. I will need to reduce the spironolactone as his blood pressure is somewhat low. We will see him back soon for further adjustments. At that time he will need a basic metabolic profile, BNP and digoxin level as well. He knows to avoid salt and restrict his fluid. Orders: EKG w/ Interpretation (93000)  Problem # 2:  HEART FAILURE (ICD-428.9) He seems to be euvolemic. However, we need to continue to titrate his medications aggressively and watch him closely. I stressed the importance of this with the patient and his mom. I stressed the importance of followup. We reviewed his medications carefully as we know he has difficulty reading.  Of note he still does not want to consider device therapy as his Medicare will not kick in until October.  Problem # 3:  HYPERTENSION (ICD-401.9) His blood pressure is actually low which may be the limiting step and medication management.  Patient Instructions: 1)  Your physician recommends that you schedule a follow-up appointment in:  2)  Your physician has recommended you make the following change in your medication:  INCREASE METOPROLOL TO 50 MG TWICE A DAY,  DECREASE SPIRONALACTONE TO 25 MG A DAY 3)  You have been diagnosed with atrial fibrillation.  Atrial fibrillation is a condition in which one of the upper chambers of the heart has extra electrical cells causing it to beat very fast.  Please see the handout/brochure given to you today for further information. 4)  You have been diagnosed with Congestive Heart Failure or CHF.  CHF is a condition in which a problem with the structure or function of the heart impairs its ability to supply sufficient blood flow to meet the body's needs.  For further information please visit www.cardiosmart.org for detailed information on CHF. 5)  Your physician recommends that you weigh, daily, at the same time every day, and in the same amount of clothing.  Please record your daily weights on the handout provided and bring it to your next appointment. Prescriptions: TOPROL XL  50 MG XR24H-TAB (METOPROLOL SUCCINATE) one twice a day  #60 x 6   Entered by:   Charolotte Capuchin, RN   Authorized by:   [name], MD, [facility]   Signed by:   Charolotte Capuchin, RN on 02/17/2009   Method used:   Electronically to        [facility] [address] (retail)       [phone]       [place], [place]  [address]       Ph: [phone]       Fax: [phone]   RxID:   [id number] SPIRONOLACTONE 50 MG TABS (SPIRONOLACTONE) 1/2 tablet daily  #30 x 6   Entered by:   Charolotte Capuchin, RN   Authorized by:   [name], MD, [facility]   Signed by:   Charolotte Capuchin, RN on 02/17/2009   Method used:   Electronically to        [facility] [address] (retail)       [phone]       [place], [place]  [address]       Ph: [phone]       Fax: [phone]   RxID:   [id number]

## 2010-02-19 NOTE — Medication Information (Signed)
Summary: rov/eac  Anticoagulant Therapy  Managed by: Charolotte Eke, PharmD Referring MD: Dr Antoine Poche PCP: Ace Gins, MD Supervising MD: [name] MD, Sharlot Gowda Indication 1: Cerebrovascular Accident Lab Used: [facility] [place] Site: [address] INR POC 2.8 INR RANGE 2.0-3.0  Dietary changes: no    Health status changes: no    Bleeding/hemorrhagic complications: no    Recent/future hospitalizations: no    Any changes in medication regimen? no    Recent/future dental: no  Any missed doses?: no       Is patient compliant with meds? yes       Current Medications (verified): 1)  Levothyroxine Sodium 75 Mcg Tabs (Levothyroxine Sodium) .Marland Kitchen.. 1 By Mouth Daily 2)  Digoxin 0.25 Mg Tabs (Digoxin) .Marland Kitchen.. 1 By Mouth Daily 3)  Warfarin Sodium 5 Mg Tabs (Warfarin Sodium) .... One By Mouth Daily or As Directed 4)  Cymbalta 30 Mg Cpep (Duloxetine Hcl) .Marland Kitchen.. 1 By Mouth Daili 5)  Toprol Xl 50 Mg Xr24h-Tab (Metoprolol Succinate) .... One Twice A Day 6)  Spironolactone 50 Mg Tabs (Spironolactone) .... 1/2 Tablet Daily 7)  Warfarin Sodium 7.5 Mg Tabs (Warfarin Sodium) .Marland Kitchen.. 1 By Mouth As Directed 8)  Acyclovir 400 Mg Tabs (Acyclovir) .... Take 1 Tablet By Mouth Two Times A Day 9)  Furosemide 40 Mg Tabs (Furosemide) .... 2 By Mouth Daily 10)  Enalapril Maleate 5 Mg Tabs (Enalapril Maleate) .... One Twice A Day 11)  Aspirin 81 Mg  Tabs (Aspirin) .Marland Kitchen.. 1 By Mouth Daily 12)  Tramadol Hcl 50 Mg Tabs (Tramadol Hcl) .... One or Two Every 8 Hours As Needed For Pain  Allergies (verified): No Known Drug Allergies  Anticoagulation Management History:      The patient is taking warfarin and comes in today for a routine follow up visit.  Negative risk factors for bleeding include an age less than 60 years old.  The bleeding index is 'low risk'.  Positive CHADS2 values include History of CHF and History of HTN.  Negative CHADS2 values include Age > 59 years old.  His last INR was 1.0 ratio.   Anticoagulation responsible provider: [name] MD, Sharlot Gowda.  INR POC: 2.8.  Cuvette Lot#: [id number].  Exp: 08/19/2010.    Anticoagulation Management Assessment/Plan:      The patient's current anticoagulation dose is Warfarin sodium 5 mg tabs: one by mouth daily or as directed, Warfarin sodium 7.5 mg tabs: 1 by mouth as directed.  The target INR is 2.0-3.0.  The next INR is due 06/19/2009.  Anticoagulation instructions were given to patient.  Results were reviewed/authorized by Charolotte Eke, PharmD.  He was notified by Charolotte Eke, PharmD.         Prior Anticoagulation Instructions: INR 1.5  Start NEW dosing schedule of 2 tablets on Monday and Wednesday and 1.5 tablets all other days.  Return to clinic in 2 weeks.      Current Anticoagulation Instructions: The patient is to continue with the same dose of coumadin.  This dosage includes: 7.5mg  daily except 10mg  on Mon and Wed.

## 2010-02-19 NOTE — Progress Notes (Signed)
Summary: Pt want to know if he can take a shower  Phone Note Call from Patient Call back at Home Phone [phone]   Caller: Patient Summary of Call: Pt want to know if he can take a shower Initial call taken by: Judie Grieve,  July 08, 2009 1:57 PM  Follow-up for Phone Call        PER PT NO FURTHER DRAINAGE NOTED , MAY RESUME SHOWERING. Follow-up by: Scherrie Bateman, LPN,  July 08, 2009 2:35 PM

## 2010-02-19 NOTE — Letter (Signed)
Summary: ER Notification  Architectural technologist, Main Office  [address]   [place], [place] [address]   Phone: [phone]  Fax: [phone]    Jun 06, 2009 2:57 PM  Raymond [name]  The above referenced patient has been advised to report directly to the Emergency Room. Please see below for more information:  Dx:   HX of cardiomyopathy and CVA per family report.Raymond [name]  had "passed out in the yard" and a neighbor found him.  He is alert and able to communicate at this time.  Dr Antoine Poche has been talking to him about ICD placement and so far the pt has refused.  Private Vehicle  _______X_______ or EMS:  ________________   Orders:  Yes ______ or No  ___X___   Notify upon arrival:     Trish [phone]       Or _________________   Thank you,    [facility] Staff

## 2010-02-19 NOTE — Assessment & Plan Note (Signed)
Summary: 6wk f/u sl   Visit Type:  Follow-up Primary Provider:  Ace Gins, MD  CC:  Cardiomyopathy.  History of Present Illness: The patient presents for followup of his nonischemic heart myopathy. Since I last saw him he has had intermittent shortness of breath. He says some days it is good and others more labored. He cannot correlate this with any changes in his diet but I'm not convinced that he is particularly strict. He has gained 12 pounds but doesn't think he's been eating more. He is certainly in less distress than he was previously. He denies any chest pressure, neck or arm discomfort. Is not really noticing any palpitations, presyncope or syncope. He does occasionally find it difficult to lie flat at night because of dyspnea but again this is sporadic. At the last visit I added enalapril. He tolerated this despite a previous intolerance to ACE inhibitors. Of note he was also given instruction to discuss ongoing use of Plavix with his neurologist. This has since been discontinued.  Current Medications (verified): 1)  Levothyroxine Sodium 75 Mcg Tabs (Levothyroxine Sodium) .Marland Kitchen.. 1 By Mouth Daily 2)  Digoxin 0.25 Mg Tabs (Digoxin) .Marland Kitchen.. 1 By Mouth Daily 3)  Warfarin Sodium 5 Mg Tabs (Warfarin Sodium) .... One By Mouth Daily or As Directed 4)  Cymbalta 30 Mg Cpep (Duloxetine Hcl) .Marland Kitchen.. 1 By Mouth Daili 5)  Toprol Xl 50 Mg Xr24h-Tab (Metoprolol Succinate) .... One Twice A Day 6)  Spironolactone 50 Mg Tabs (Spironolactone) .... 1/2 Tablet Daily 7)  Warfarin Sodium 7.5 Mg Tabs (Warfarin Sodium) .Marland Kitchen.. 1 By Mouth As Directed 8)  Acyclovir 400 Mg Tabs (Acyclovir) .... Take 1 Tablet By Mouth Two Times A Day 9)  Furosemide 40 Mg Tabs (Furosemide) .... 2 By Mouth Daily 10)  Enalapril Maleate 5 Mg Tabs (Enalapril Maleate) .... One Twice A Day 11)  Aspirin 81 Mg  Tabs (Aspirin) .Marland Kitchen.. 1 By Mouth Daily  Allergies (verified): No Known Drug Allergies  Past History:  Past Medical History:  1.  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   2. Palpitations with wide complex tachycardia in the past (currently       refusing ICD).   3. Previous history of ACE inhibitor intolerance.   4. Obesity.   5. Dyslexia.   6. Bifascicular block.   7. Hypertension.   8. Atrial fibrillation.   9. Hypothyroidism (status post radiation ablation of thyroid).   10. 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 Left knee surgery in 94 and 95  Review of Systems       As stated in the HPI and negative for all other systems.   Vital Signs:  Patient profile:   50 year old male Height:      71 inches Weight:      242 pounds BMI:     33.87 Pulse rate:   92 / minute Resp:     18 per minute BP sitting:   130 / 82  (right arm)  Vitals Entered By: Marrion Coy, CNA (May 05, 2009 10:16 AM)  Physical Exam  General:  Well developed, well nourished, in no acute distress. Head:  normocephalic and atraumatic Eyes:  PERRLA/EOM intact; conjunctiva and lids normal. Mouth:  Teeth, gums and palate normal. Oral mucosa normal. Neck:  Neck supple, no JVD.  No masses, thyromegaly or abnormal cervical nodes. Chest Wall:  no deformities or breast masses noted Lungs:  Clear bilaterally to auscultation and percussion. Abdomen:  Bowel sounds positive; abdomen soft and non-tender without masses, organomegaly, or hernias noted. No hepatosplenomegaly,obese Msk:  Back normal, normal gait. Muscle strength and tone normal. Extremities:  No clubbing or cyanosis. Neurologic:  weakness noted:.   Skin:  Intact without lesions or rashes. Psych:  Normal affect.   Detailed Cardiovascular Exam  Neck    Carotids: Carotids full and equal bilaterally without bruits.      Neck Veins: Normal, no JVD.    Heart    Inspection: no  deformities or lifts noted.      Palpation: normal PMI with no thrills palpable.      Auscultation: S1 and S2 within normal limits, no S3, no S4, no clicks, no rubs, thrill 6 apical systolic murmur radiating slightly to the axilla, no diastolic murmurs.  Vascular    Abdominal Aorta: no palpable masses, pulsations, or audible bruits.      Femoral Pulses: normal femoral pulses bilaterally.      Pedal Pulses: normal pedal pulses bilaterally.      Radial Pulses: normal radial pulses bilaterally.      Peripheral Circulation: no clubbing, cyanosis, or edema noted with normal capillary refill.     Impression & Recommendations:  Problem # 1:  HEART FAILURE (ICD-428.9) Today I will titrate his ACE inhibitor by increasing the enalapril to 5 mg b.i.d. He will get a basic metabolic profile in 2 weeks. The next step will be to titrate his beta blocker. We discussed salt and fluid restriction again. Despite his weight gain I do not believe that he is volume overloaded.  Problem # 2:  FIBRILLATION, ATRIAL (ICD-427.31)  He maintains rate control and anticoagulation. I will consider cardioversion in the future.  The following medications were removed from the medication list:    Plavix 75 Mg Tabs (Clopidogrel bisulfate) .Marland Kitchen... 1 by mouth daily His updated medication list for this problem includes:    Digoxin 0.25 Mg Tabs (Digoxin) .Marland Kitchen... 1 by mouth daily    Warfarin Sodium 5 Mg Tabs (Warfarin sodium) ..... One by mouth daily or as directed    Toprol Xl 50 Mg Xr24h-tab (Metoprolol succinate) ..... One twice a day    Warfarin Sodium 7.5 Mg Tabs (Warfarin sodium) .Marland Kitchen... 1 by mouth as directed    Aspirin 81 Mg Tabs (Aspirin) .Marland Kitchen... 1 by mouth daily  Problem # 3:  HYPERTENSION (ICD-401.9)  His blood pressure is now high enough to allow [place] to titrate meds. Hypertension has not been the issue since his stroke.  Problem # 4:  OBESITY (ICD-278.00) We discussed his weight is an issue.  Patient  Instructions: 1)  Your physician recommends that you schedule a follow-up appointment in: 6 weeks with Dr Antoine Poche 2)  Your physician has recommended you make the following change in your medication: increase Enalapril  to 5 mg twice a day.  May take Tramadol as needed for pain 3)  Your physician recommends that you return for lab work in: 2 weeks for a BMP  428.22 Prescriptions: TRAMADOL HCL 50 MG TABS (TRAMADOL HCL) one or two every 8 hours as needed for pain  #60 x 0   Entered by:   Charolotte Capuchin, RN   Authorized by:   [name], MD, [facility]   Signed by:   Charolotte Capuchin, RN on 05/05/2009   Method used:   Electronically to  [facility] [address] (retail)       [phone]       [place], [place]  [address]       Ph: [phone]       Fax: [phone]   RxID:   [id number] ENALAPRIL MALEATE 5 MG TABS (ENALAPRIL MALEATE) one twice a day  #60 x 6   Entered by:   Charolotte Capuchin, RN   Authorized by:   [name], MD, [facility]   Signed by:   Charolotte Capuchin, RN on 05/05/2009   Method used:   Electronically to        [facility] [address] (retail)       [phone]       [place], [place]  [address]       Ph: [phone]       Fax: [phone]   RxID:   [id number]

## 2010-02-19 NOTE — Progress Notes (Signed)
Summary: Pharmacy have question about Warfarin  Phone Note From Pharmacy   Caller: [facility] /[phone]/[place] Summary of Call: Pharmacy have question about  Warfarin Initial call taken by: Judie Grieve,  March 04, 2009 9:02 AM  Follow-up for Phone Call        Pt to be taking 5mg  daily except on Mondays then it's one & 1/2 tablet.  Pharmacy aware. Follow-up by: Charolotte Capuchin, RN,  March 04, 2009 9:19 AM

## 2010-02-19 NOTE — Miscellaneous (Signed)
Summary: Orders Update  Clinical Lists Changes  Medications: Rx of ENALAPRIL MALEATE 5 MG TABS (ENALAPRIL MALEATE) one twice a day;  #60 x 6;  Signed;  Entered by: Charolotte Capuchin, RN;  Authorized by: [name], MD, [facility];  Method used: Electronically to [facility] [address], [place], [place]  [address], Ph: [phone], Fax: [phone] Rx of TRAMADOL HCL 50 MG TABS (TRAMADOL HCL) one or two every 8 hours as needed for pain;  #60 x 0;  Signed;  Entered by: Charolotte Capuchin, RN;  Authorized by: [name], MD, [facility];  Method used: Electronically to [facility] [address], [place], [place]  [address], Ph: [phone], Fax: [phone]    Prescriptions: TRAMADOL HCL 50 MG TABS (TRAMADOL HCL) one or two every 8 hours as needed for pain  #60 x 0   Entered by:   Charolotte Capuchin, RN   Authorized by:   [name], MD, [facility]   Signed by:   Charolotte Capuchin, RN on 05/05/2009   Method used:   Electronically to        [facility]* (retail)       [address]       [place], [place]  [address]       Ph: [phone]       Fax: [phone]   RxID:   [id number] ENALAPRIL MALEATE 5 MG TABS (ENALAPRIL MALEATE) one twice a day  #60 x 6   Entered by:   Charolotte Capuchin, RN   Authorized by:   [name], MD, [facility]   Signed by:   Charolotte Capuchin, RN on 05/05/2009   Method used:   Electronically to        [facility]* (retail)       [address]       [place], [place]  [address]       Ph: [phone]       Fax: [phone]   RxID:   [id number]

## 2010-02-19 NOTE — Assessment & Plan Note (Signed)
Summary: pc2   Primary Provider:  Ace Gins, MD   History of Present Illness: [name] is seen in followup for congestive heart failure in the setting of nonischemic cardiomyopathy for which he is status post CRT-D implantation.  He also has a history of atrial fibrillation for which he was cardioverted following loading with amiodarone.  The patient was noted to have an elevated TSH on amiodarone therapy; his Synthroid was increased   Overall he thinks he's feeling better. He is not describing breathlessness, PND or orthopnea. He is not describing lightheadedness, presyncope or syncope.   He complains also of a headache which has been fairly persistent for the last couple of months.  He continues to improve from his stroke.     Current Medications (verified): 1)  Levothyroxine Sodium 75 Mcg Tabs (Levothyroxine Sodium) .Marland Kitchen.. 1and 1/2 Tablets By Mouth Daily 2)  Digoxin 0.25 Mg Tabs (Digoxin) .Marland Kitchen.. 1 By Mouth Daily 3)  Warfarin Sodium 5 Mg Tabs (Warfarin Sodium) .... One By Mouth Daily or As Directed 4)  Toprol Xl 50 Mg Xr24h-Tab (Metoprolol Succinate) .... One Twice A Day 5)  Spironolactone 50 Mg Tabs (Spironolactone) .... 1/2 Tablet Daily 6)  Warfarin Sodium 7.5 Mg Tabs (Warfarin Sodium) .Marland Kitchen.. 1 By Mouth As Directed 7)  Acyclovir 400 Mg Tabs (Acyclovir) .... Take 1 Tablet By Mouth Two Times A Day 8)  Furosemide 40 Mg Tabs (Furosemide) .... As Needed 9)  Enalapril Maleate 5 Mg Tabs (Enalapril Maleate) .... One Twice A Day 10)  Tramadol Hcl 50 Mg Tabs (Tramadol Hcl) .... One or Two Every 8 Hours As Needed For Pain 11)  Amiodarone Hcl 200 Mg Tabs (Amiodarone Hcl) .... Take One Tablet Once Daily  Allergies (verified): No Known Drug Allergies  Past History:  Past Medical History: Last updated: 08/06/2009  1. 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   2. Palpitations with wide complex tachycardia in the past (currently       refusing ICD).   3. Previous history of  ACE inhibitor intolerance.   4. Obesity.   5. Dyslexia.   6. Bifascicular block.   7. Hypertension.   8. Atrial fibrillation s/p ablation  9. Hypothyroidism (status post radiation ablation of thyroid).   10. 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   There is no family history of cardiomyopathy.      Social History: Last updated: 04/18/2008 The patient is divorced, he repairs cars.  He does not   have children and he does not smoke and does not drink alcohol.   Vital Signs:  Patient profile:   50 year old male Height:      71 inches Weight:      254 pounds BMI:     35.55 Pulse rate:   66 / minute Pulse rhythm:   regular BP sitting:   98 / 68  (right arm) Cuff size:   large  Vitals Entered By: Judithe Modest CMA (September 16, 2009 8:51 AM)  Physical Exam  General:  Well developed, well nourished, in no acute distress. Head:  normal HEENT Neck:  supple Lungs:  clear Heart:  regular rate and rhythm Abdomen:  snontender with active bowel sounds Msk:  walking with a cane but improved gait Extremities:  no clubbing or cyanosis trace edema on the left side Neurologic:  weak on the left Skin:  warm and dry Psych:  flat affect    ICD Specifications Following MD:  Sherryl Manges, MD     ICD Vendor:  [employer]     ICD Model Number:  [id number]     ICD Serial Number:  [id number] ICD DOI:  06/09/2009     ICD Implanting MD:  Sherryl Manges, MD  Lead 1:    Location: RA     DOI: 06/09/2009     Model #: [id number]     Serial #: [id number]     Status: active Lead 2:    Location: RV     DOI: 06/09/2009     Model #: [id number]     Serial #: [id number]     Status: active Lead 3:    Location: LV     DOI: 06/09/2009     Model #: [id number]      Serial #: [id number]     Status: active  Indications::  CM   ICD Follow Up Battery Voltage:  92% V     Charge Time:  8.9 seconds     Battery Est. Longevity:  4.2 yrs Underlying rhythm:  SR   ICD Device Measurements Atrium:  Amplitude: 4.4 mV, Impedance: 410 ohms, Threshold: 0.5 V at 0.5 msec Right Ventricle:  Amplitude: 11.6 mV, Impedance: 530 ohms, Threshold: 0.5 V at 0.5 msec Left Ventricle:  Impedance: 730 ohms, Threshold: 0.75 V at 0.5 msec Shock Impedance: 56 ohms   Episodes MS Episodes:  5     Percent Mode Switch:  <1%     Shock:  0     ATP:  0     Nonsustained:  0     Atrial Therapies:  0 Atrial Pacing:  52%     Ventricular Pacing:  94%  Brady Parameters Mode DDD     Lower Rate Limit:  60     Upper Rate Limit 130 PAV 180     Sensed AV Delay:  160  Tachy Zones VF:  240     VT:  200     VT1:  171     Next Remote Date:  12/18/2009     Next Cardiology Appt Due:  08/19/2010 Tech Comments:  4 NOISE REVERSION EPISODES--ALL ON 08-25-09.  PT WAS AT [facility].  NORMAL DEVICE FUNCTION.  CHANGED RA OUTPUT FROM 3.5 TO .2.0 AND RV OUTPUT FROM 3.5 TO 2.5 AND LV OUTPUT FROM 3.5 TO 2.5 V.  PT WANTS TO BE ENROLLED IN [place].  [facility] CHECK 12-18-09. ROV IN 12 MTHS W/SK. Vella Kohler  September 16, 2009 9:13 AM  Impression & Recommendations:  Problem # 1:  HEADACHE (ICD-784.0)  tpatient has a headache in the setting of anticoagulation and will need CAT scanning. He cannot do it today he'll come back tomorrow. His updated medication list for this problem includes:    Toprol Xl 50 Mg Xr24h-tab (Metoprolol succinate) ..... One twice a day    Tramadol Hcl 50 Mg Tabs (Tramadol hcl) ..... One or two every 8 hours as needed for pain  Orders: CT Scan  (CT Scan)  Problem # 2:  AUTOMATIC IMPLANTABLE CARDIAC DEFIBRILLATOR SITU (ICD-V45.02) Device parameters and data were reviewed and no changes were made; ;  there was an episode of noise reversion on both the atrial lead and the ventricular lead  occurring on the  same day in August. The patient does not have a recollection as to what he was exposed to.  Problem # 3:  FIBRILLATION, ATRIAL (ICD-427.31) somewhat surprisingly, he has maintained sinus rhythm since his cardioversion in June. We'll continue him on his amiodarone. We'll need to recheck his TSH following up titration of his Synthroid. His updated medication list for this problem includes:    Digoxin 0.25 Mg Tabs (Digoxin) .Marland Kitchen... 1 by mouth daily    Warfarin Sodium 5 Mg Tabs (Warfarin sodium) ..... One by mouth daily or as directed    Toprol Xl 50 Mg Xr24h-tab (Metoprolol succinate) ..... One twice a day    Warfarin Sodium 7.5 Mg Tabs (Warfarin sodium) .Marland Kitchen... 1 by mouth as directed    Amiodarone Hcl 200 Mg Tabs (Amiodarone hcl) .Marland Kitchen... Take one tablet once daily  Problem # 4:  HYPOTHYROIDISM (ICD-244.9)  as above His updated medication list for this problem includes:    Levothyroxine Sodium 75 Mcg Tabs (Levothyroxine sodium) .Marland Kitchen... 1and 1/2 tablets by mouth daily  His updated medication list for this problem includes:    Levothyroxine Sodium 75 Mcg Tabs (Levothyroxine sodium) .Marland Kitchen... 1and 1/2 tablets by mouth daily  Orders: TLB-BMP (Basic Metabolic Panel-BMET) (80048-METABOL) TLB-TSH (Thyroid Stimulating Hormone) (84443-TSH)  Problem # 5:  HEART FAILURE (ICD-428.9) stable on current meds  Problem # 6:  HYPERTENSION (ICD-401.9) his blood pressure is running 95 200; however he has no symptoms. We will continue his current medications His updated medication list for this problem includes:    Toprol Xl 50 Mg Xr24h-tab (Metoprolol succinate) ..... One twice a day    Spironolactone 50 Mg Tabs (Spironolactone) .Marland Kitchen... 1/2 tablet daily    Furosemide 40 Mg Tabs (Furosemide) .Marland Kitchen... As needed    Enalapril Maleate 5 Mg Tabs (Enalapril maleate) ..... One twice a day  Patient Instructions: 1)  Your physician recommends that you schedule a follow-up appointment in: 1 YEAR WITH DR.  Graciela Husbands. 2)  Your physician recommends that you for lab work today: BMET and TSH. 3)  Your physician recommends that you continue on your current medications as directed. Please refer to the Current Medication list given to you today. 4)  Non-Cardiac CT scanning, (CAT scanning), is a noninvasive, special x-ray that produces cross-sectional images of the body using x-rays and a computer. CT scans help physicians diagnose and treat medical conditions. For some CT exams, a contrast material is used to enhance visibility in the area of the body being studied. CT scans provide greater clarity and reveal more details than regular x-ray exams.

## 2010-02-19 NOTE — Progress Notes (Signed)
Summary: mom calling pt past out in yard  Phone Note Call from Patient Call back at Home Phone [phone] Call back at [phone]   Caller: Mom Reason for Call: Talk to Nurse Summary of Call: c/o pt past out in the yard this am. pt lives in [place], family friends bring him to mom. mom would like to know can he be seen today if possible.  Initial call taken by: Lorne Skeens,  Jun 06, 2009 2:12 PM  Follow-up for Phone Call        fell this am, bit his tongue his tongue and face is bruised, mother states pt was out for one hour or more before he was found.  Pt is now alert and communicating.  No other injury reported.  She is taking him to [facility] ED. Follow-up by: Charolotte Capuchin, RN,  Jun 06, 2009 2:57 PM

## 2010-02-19 NOTE — Progress Notes (Signed)
Summary: massage  Phone Note Call from Patient Call back at [phone]   Caller: Mom Reason for Call: Talk to Nurse Summary of Call: needs approval for massage, please fax to [facility] [phone] Initial call taken by: Migdalia Dk,  March 03, 2009 8:36 AM  Follow-up for Phone Call        OK for massage Follow-up by: Charolotte Capuchin, RN,  March 03, 2009 9:31 AM     Appended Document: massage ok faxed to above number

## 2010-02-19 NOTE — Medication Information (Signed)
Summary: rov/mb  Anticoagulant Therapy  Managed by: Weston Brass, PharmD Referring MD: Dr Antoine Poche PCP: Ace Gins, MD Supervising MD: Myrtis Ser MD, Tinnie Gens Indication 1: Cerebrovascular Accident Indication 2: Atrial Fibrillation Lab Used: [facility] [place] Site: [address] INR POC 2.6 INR RANGE 2.0-3.0  Dietary changes: no    Health status changes: no    Bleeding/hemorrhagic complications: no    Recent/future hospitalizations: no    Any changes in medication regimen? no    Recent/future dental: no  Any missed doses?: no       Is patient compliant with meds? yes       Allergies: No Known Drug Allergies  Anticoagulation Management History:      The patient is taking warfarin and comes in today for a routine follow up visit.  Negative risk factors for bleeding include an age less than 25 years old.  The bleeding index is 'low risk'.  Positive CHADS2 values include History of CHF and History of HTN.  Negative CHADS2 values include Age > 44 years old.  His last INR was 3.1 ratio.  Anticoagulation responsible provider: Myrtis Ser MD, Tinnie Gens.  INR POC: 2.6.  Cuvette Lot#: [id number].  Exp: 10/2010.    Anticoagulation Management Assessment/Plan:      The patient's current anticoagulation dose is Warfarin sodium 5 mg tabs: one by mouth daily or as directed, Warfarin sodium 7.5 mg tabs: 1 by mouth as directed.  The target INR is 2.0-3.0.  The next INR is due 09/03/2009.  Anticoagulation instructions were given to patient.  Results were reviewed/authorized by Weston Brass, PharmD.  He was notified by Weston Brass PharmD.         Prior Anticoagulation Instructions: INR = 3.0  The patient is to continue with the same dose of coumadin.  This dosage includes: 1.5 tablets on all days except for tuesdays and saturdays take 1 tablet.   Current Anticoagulation Instructions: INR 2.6  Continue same dose of 1 1/2 tablets every day except 1 tablet on Tuesday and Saturday.

## 2010-02-19 NOTE — Medication Information (Signed)
Summary: rov/jk  Anticoagulant Therapy  Managed by: Weston Brass, PharmD Referring MD: Dr Antoine Poche PCP: Ace Gins, MD Supervising MD: Johney Frame MD, Fayrene Fearing Indication 1: Cerebrovascular Accident Indication 2: Atrial Fibrillation Lab Used: [facility] [place] Site: [address] INR POC 1.5 INR RANGE 2.0-3.0  Dietary changes: no    Health status changes: no    Bleeding/hemorrhagic complications: no    Recent/future hospitalizations: no    Any changes in medication regimen? no    Recent/future dental: no  Any missed doses?: yes     Details: missed last 4 doses because he was out of medicine and no money to get refilled  Is patient compliant with meds? yes       Allergies: No Known Drug Allergies  Anticoagulation Management History:      The patient is taking warfarin and comes in today for a routine follow up visit.  Negative risk factors for bleeding include an age less than 87 years old.  The bleeding index is 'low risk'.  Positive CHADS2 values include History of CHF and History of HTN.  Negative CHADS2 values include Age > 62 years old.  His last INR was 3.1 ratio.  Anticoagulation responsible provider: Allred MD, Fayrene Fearing.  INR POC: 1.5.  Exp: 10/2010.    Anticoagulation Management Assessment/Plan:      The patient's current anticoagulation dose is Warfarin sodium 5 mg tabs: one by mouth daily or as directed, Warfarin sodium 7.5 mg tabs: 1 by mouth as directed.  The target INR is 2.0-3.0.  The next INR is due 10/15/2009.  Anticoagulation instructions were given to patient.  Results were reviewed/authorized by Weston Brass, PharmD.  He was notified by Weston Brass PharmD.         Prior Anticoagulation Instructions: INR 3.0  Continue taking 1.5 tablets (7.5mg ) every day except take 1 tablet (5mg ) on Tuesdays and Saturdays.  Recheck in 4 weeks.   Current Anticoagulation Instructions: INR 1.5  Take an extra 1/2 tablet today and tomorrow (total of 2 tablets) then  resume same dose of 1 1/2 tablets every day except 1 tablet on Tuesday and Saturday.  Recheck INR in 2 weeks.

## 2010-02-19 NOTE — Assessment & Plan Note (Signed)
Summary: INCREASED SOB, CHOKING FEELING   Visit Type:  Follow-up  CC:  Increased Sob-choking feeling.  History of Present Illness: 50 year-old gentleman with CHF presents for evaluation of 'choking feeling,' gagging, orthopnea, worsening exertional dyspnea. Denies cough or choking with oral intake. Also reports dequate intake of fluids.  He states exertional dyspnea has been worse ever since he had a stroke in November 2010, but symptoms now are even worse than his new baseline.  Denies chest pain, edema, or syncope.  Current Medications (verified): 1)  Levothyroxine Sodium 75 Mcg Tabs (Levothyroxine Sodium) .Marland Kitchen.. 1 By Mouth Daily 2)  Digoxin 0.25 Mg Tabs (Digoxin) .Marland Kitchen.. 1 By Mouth Daily 3)  Warfarin Sodium 5 Mg Tabs (Warfarin Sodium) .... One By Mouth Daily or As Directed 4)  Cymbalta 30 Mg Cpep (Duloxetine Hcl) .Marland Kitchen.. 1 By Mouth Daili 5)  Plavix 75 Mg Tabs (Clopidogrel Bisulfate) .Marland Kitchen.. 1 By Mouth Daily 6)  Toprol Xl 50 Mg Xr24h-Tab (Metoprolol Succinate) .... One Twice A Day 7)  Spironolactone 50 Mg Tabs (Spironolactone) .... 1/2 Tablet Daily 8)  Warfarin Sodium 7.5 Mg Tabs (Warfarin Sodium) .Marland Kitchen.. 1 By Mouth As Directed 9)  Acyclovir 400 Mg Tabs (Acyclovir) .... Take 1 Tablet By Mouth Two Times A Day  Allergies (verified): No Known Drug Allergies  Past History:  Past medical history reviewed for relevance to current acute and chronic problems.  Past Medical History: Reviewed history from 02/17/2009 and no changes required.  1. 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   2. Palpitations with wide complex tachycardia in the past (currently       refusing ICD).   3. Previous history of ACE inhibitor intolerance.   4. Obesity.   5. Dyslexia.   6. Bifascicular block.   7. Hypertension.   8. Atrial fibrillation.   9. Hypothyroidism (status post radiation ablation of thyroid).   10. CVA  Review of Systems       Negative except as per HPI. Specifically denies cough, fever,  chills, malaise, or systemic symptoms.   Vital Signs:  Patient profile:   50 year old male Height:      71 inches Weight:      237.50 pounds BMI:     33.24 O2 Sat:      93 % on Room air Pulse rate:   74 / minute Pulse rhythm:   regular BP sitting:   128 / 88  (left arm) Cuff size:   large  Vitals Entered By: Vikki Ports (February 24, 2009 3:07 PM)  O2 Flow:  Room air  Physical Exam  General:  Pt is alert and oriented, obese male, in no acute distress. HEENT: normal. Oropharynx is clear.  Neck: normal carotid upstrokes without bruits, JVP normal Lungs: CTA. There is no stridor. CV: irregularly irregular without murmur or gallop Abd: soft, NT, positive BS, no bruit, no organomegaly Ext: no clubbing, cyanosis, or edema. peripheral pulses 2+ and equal Skin: warm and dry without rash    Impression & Recommendations:  Problem # 1:  HEART FAILURE (ICD-428.9) The patient's symptoms are a bit unusual, but he does c/o worsening orthopnea and DOE, which I suspect is secondary to CHF. He does not have physical exam signs of volume excess. Recommend furosemide 40 mg daily for 5 days, then as needed. He is already on a good regimen for his cardiomyopathy/CHF under the care of Dr Antoine Poche. Will arrange for a metabolic panel when he returns for scheduled f/u with Dr Antoine Poche  2/24. I don't appreciate any abnormality in his oropharynx to explain his 'choking' sensation and there is no stridor on exam. If symptoms persist or worsen he was advised to contact us later this week.  Problem # 2:  FIBRILLATION, ATRIAL (ICD-427.31) HR better today - recent med adjustments made by Dr Antoine Poche with increased Toprol XL.  His updated medication list for this problem includes:    Digoxin 0.25 Mg Tabs (Digoxin) .Marland Kitchen... 1 by mouth daily    Warfarin Sodium 5 Mg Tabs (Warfarin sodium) ..... One by mouth daily or as directed    Plavix 75 Mg Tabs (Clopidogrel bisulfate) .Marland Kitchen... 1 by mouth daily    Toprol Xl 50  Mg Xr24h-tab (Metoprolol succinate) ..... One twice a day    Warfarin Sodium 7.5 Mg Tabs (Warfarin sodium) .Marland Kitchen... 1 by mouth as directed  Patient Instructions: 1)  Your physician recommends that you keep your scheduled follow-up appointment with Dr Antoine Poche on March 13, 2009.  2)  Your physician recommends that you have lab work on: 03/13/09 (BMP 428.23) 3)  Your physician has recommended you make the following change in your medication: START Furosemide 40mg  one tablet daily for the next 5 days then use Furosemide as needed for symptoms Prescriptions: FUROSEMIDE 40 MG TABS (FUROSEMIDE) Take one tablet by mouth daily for 5 days then use as needed  #30 x 1   Entered by:   Julieta Gutting, RN, BSN   Authorized by:   Norva Karvonen, MD   Signed by:   Julieta Gutting, RN, BSN on 02/24/2009   Method used:   Electronically to        [facility]* (retail)       [address]       [place], [place]  [address]       Ph: [phone]       Fax: [phone]   RxID:   [id number]

## 2010-02-19 NOTE — Assessment & Plan Note (Signed)
Summary: 4 month rov 428.22   pfh,rn   Visit Type:  Follow-up Primary Arora Coakley:  Ace Gins, MD  CC:  CHF.  History of Present Illness: The patient presents for followup. Since I last saw him he has done well. He has started working out at gym. He is stronger in his leg and recovering from his large CVA. He still has residual left arm weakness. He has no shortness of breath, PND or orthopnea. He has no palpitations, presyncope or syncope. He has not had any chest pain. He was complaining about headaches when he saw Dr. Graciela Husbands last but a head CT showed no acute abnormalities. He is having problems with his thyroid probably related to amiodarone  but this is being adjusted by his primary physician.  Current Medications (verified): 1)  Levothroid 137 Mcg Tabs (Levothyroxine Sodium) .Marland Kitchen.. 1 By Mouth Daily 2)  Digoxin 0.25 Mg Tabs (Digoxin) .Marland Kitchen.. 1 By Mouth Daily 3)  Warfarin Sodium 5 Mg Tabs (Warfarin Sodium) .... One By Mouth Daily or As Directed 4)  Toprol Xl 50 Mg Xr24h-Tab (Metoprolol Succinate) .... One Twice A Day 5)  Spironolactone 50 Mg Tabs (Spironolactone) .... 1/2 Tablet Daily 6)  Warfarin Sodium 7.5 Mg Tabs (Warfarin Sodium) .Marland Kitchen.. 1 By Mouth As Directed 7)  Acyclovir 400 Mg Tabs (Acyclovir) .... Take 1 Tablet By Mouth Two Times A Day 8)  Furosemide 40 Mg Tabs (Furosemide) .... As Needed 9)  Enalapril Maleate 5 Mg Tabs (Enalapril Maleate) .... One Twice A Day 10)  Tramadol Hcl 50 Mg Tabs (Tramadol Hcl) .... One or Two Every 8 Hours As Needed For Pain 11)  Amiodarone Hcl 200 Mg Tabs (Amiodarone Hcl) .... Take One Tablet Once Daily  Allergies (verified): No Known Drug Allergies  Past History:  Past Medical History: Reviewed history from 08/06/2009 and no changes required.  1. 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   2. Palpitations with wide complex tachycardia in the past (currently       refusing ICD).   3. Previous history of ACE inhibitor intolerance.   4.  Obesity.   5. Dyslexia.   6. Bifascicular block.   7. Hypertension.   8. Atrial fibrillation s/p ablation  9. Hypothyroidism (status post radiation ablation of thyroid).   10. 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 Left knee surgery in 94 and 95  Review of Systems       As stated in the HPI and negative for all other systems.   Vital Signs:  Patient profile:   50 year old male Height:      71 inches Weight:      259 pounds BMI:     36.25 Pulse rate:   60 / minute Resp:     18 per minute BP sitting:   112 / 80  (right arm)  Vitals Entered By: Marrion Coy, CNA (December 03, 2009 2:47 PM)  Physical Exam  General:  Well developed, well nourished, in no acute distress. Head:  normocephalic and atraumatic Mouth:  Teeth, gums and palate normal. Oral mucosa normal. Neck:  Neck supple, no JVD. No masses, thyromegaly or abnormal cervical nodes. Chest Wall:  Well-healed ICD scar Lungs:  Clear bilaterally to auscultation and percussion. Abdomen:  Bowel sounds positive; abdomen soft and non-tender without masses, organomegaly, or hernias  noted. No hepatosplenomegaly, obese Msk:  walking with a cane but improved gait Extremities:  no clubbing or cyanosis trace edema on the left side Neurologic:  weak on the left Skin:  warm and dry Psych:  flat affect   Detailed Cardiovascular Exam  Neck    Carotids: Carotids full and equal bilaterally without bruits.      Neck Veins: Normal, no JVD.    Heart    Inspection: no deformities or lifts noted.      Palpation: normal PMI with no thrills palpable.      Auscultation: S1 and S2 within normal limits, no S3, no S4, no clicks, no rubs, thrill 6 apical systolic murmur radiating slightly to the axilla, no diastolic murmurs.  Vascular    Abdominal Aorta:  no palpable masses, pulsations, or audible bruits.      Femoral Pulses: normal femoral pulses bilaterally.      Pedal Pulses: normal pedal pulses bilaterally.      Radial Pulses: normal radial pulses bilaterally.      Peripheral Circulation: no clubbing, cyanosis, with normal capillary refill.     EKG  Procedure date:  12/03/2009  Findings:      Atrioventricular paced rhythm   ICD Specifications Following MD:  Sherryl Manges, MD     ICD Vendor:  [facility] Jude     ICD Model Number:  [id number]     ICD Serial Number:  [id number] ICD DOI:  06/09/2009     ICD Implanting MD:  Sherryl Manges, MD  Lead 1:    Location: RA     DOI: 06/09/2009     Model #: [id number]     Serial #: [id number]     Status: active Lead 2:    Location: RV     DOI: 06/09/2009     Model #: [id number]     Serial #: [id number]     Status: active Lead 3:    Location: LV     DOI: 06/09/2009     Model #: [id number]     Serial #: [id number]     Status: active  Indications::  CM   Brady Parameters Mode DDD     Lower Rate Limit:  60     Upper Rate Limit 130 PAV 180     Sensed AV Delay:  160  Tachy Zones VF:  240     VT:  200     VT1:  171     Impression & Recommendations:  Problem # 1:  HEART FAILURE (ICD-428.9) He is euvolemic. She will continue the meds as listed. Orders: EKG w/ Interpretation (93000)  Problem # 2:  HYPERTENSION (ICD-401.9) His blood pressure is low and does not allow titration of his medications further.  Problem # 3:  FIBRILLATION, ATRIAL (ICD-427.31) He is maintaining sinus rhythm. Despite the problems with his thyroid I would like to continue amiodarone.  Problem # 4:  MORBID OBESITY (ICD-278.01) We discussed the need for weight loss with diet and exercise.  Patient Instructions: 1)  Your physician recommends that you schedule a follow-up appointment in: 6 months with Dr. Antoine Poche 2)  Your physician recommends that you continue on your current medications as directed. Please refer to the Current Medication list given to  you today.

## 2010-02-19 NOTE — Letter (Signed)
Summary: [facility]   [facility]   Imported By: Roderic Ovens 08/06/2009 15:18:00  _____________________________________________________________________  External Attachment:    Type:   Image     Comment:   External Document

## 2010-02-19 NOTE — Progress Notes (Signed)
Summary: meds before going to hospital on tomorrwo  Phone Note Call from Patient Call back at Home Phone [phone]   Caller: Patient Reason for Call: Talk to Nurse Summary of Call: per pt caling does pt need to take meds before going to hospital tomorow. Initial call taken by: Lorne Skeens,  July 29, 2009 10:12 AM  Follow-up for Phone Call        OK to take medications with just enough water to get them down.  He will hold furosemide however. Follow-up by: Charolotte Capuchin, RN,  July 29, 2009 10:29 AM

## 2010-02-19 NOTE — Medication Information (Signed)
Summary: rov/tm  Anticoagulant Therapy  Managed by: Weston Brass, PharmD Referring MD: Dr Antoine Poche PCP: Ace Gins, MD Supervising MD: Antoine Poche MD, Fayrene Fearing Indication 1: Cerebrovascular Accident Indication 2: Atrial Fibrillation Lab Used: [facility] [place] Site: [address] INR POC 1.2 INR RANGE 2.0-3.0  Dietary changes: no    Health status changes: no    Bleeding/hemorrhagic complications: no     Any changes in medication regimen? yes       Details: Glucosamine for joint pain    Any missed doses?: no       Is patient compliant with meds? yes       Allergies: No Known Drug Allergies  Anticoagulation Management History:      Negative risk factors for bleeding include an age less than 44 years old.  The bleeding index is 'low risk'.  Positive CHADS2 values include History of CHF and History of HTN.  Negative CHADS2 values include Age > 67 years old.  His last INR was 3.1 ratio.  Anticoagulation responsible provider: Antoine Poche MD, Fayrene Fearing.  INR POC: 1.2.  Exp: 02/2011.    Anticoagulation Management Assessment/Plan:      The patient's current anticoagulation dose is Warfarin sodium 5 mg tabs: one by mouth daily or as directed, Warfarin sodium 7.5 mg tabs: 1 by mouth as directed.  The target INR is 2.0-3.0.  The next INR is due 01/28/2010.  Anticoagulation instructions were given to patient.  Results were reviewed/authorized by Weston Brass, PharmD.  He was notified by Stephannie Peters, PharmD Candidate .         Prior Anticoagulation Instructions: INR 1.5 Today take 2 pills then resume 1 pill everyday except 1.5 pills on Sundays and Wednesdays. Recheck in 2 weeks.   Current Anticoagulation Instructions: INR 1.2   Coumadin 7.5 mg - Today take 2 tablets. Then take 1.5 tablets daily except for 1 tablet on Tuesday, Thursdays, and Saturdays.

## 2010-02-19 NOTE — Progress Notes (Signed)
Summary: calling regarding labs  Phone Note Call from Patient Call back at Home Phone [phone]   Caller: Patient Summary of Call: Pt calling regarding having lab drawn before procedure 07/30/09 Initial call taken by: Judie Grieve,  July 18, 2009 12:58 PM  Follow-up for Phone Call        Spoke with pt. pt. would like to know when he needs to come for lab work prior DFT testing. I let pt. know he is scheduled to come to the office on July 6th. Pt. verbalized understanding Follow-up by: Ollen Gross, RN, BSN,  July 18, 2009 1:13 PM

## 2010-02-19 NOTE — Procedures (Signed)
Summary: [facility]   Allergies: No Known Drug Allergies   ICD Specifications Following MD:  Sherryl Manges, MD     ICD Vendor:  [employer]     ICD Model Number:  [id number]     ICD Serial Number:  [id number] ICD DOI:  06/09/2009     ICD Implanting MD:  Sherryl Manges, MD  Lead 1:    Location: RA     DOI: 06/09/2009     Model #: [id number]     Serial #: [id number]     Status: active Lead 2:    Location: RV     DOI: 06/09/2009     Model #: [id number]     Serial #: [id number]     Status: active Lead 3:    Location: LV     DOI: 06/09/2009     Model #: 1258T     Serial #: [id number]     Status: active  Indications::  CM   ICD Follow Up Battery Voltage:  >95% V     Charge Time:  8.9 seconds     Battery Est. Longevity:  4.5-4.11yrs Underlying rhythm:  AF   ICD Device Measurements Atrium:  Amplitude: 5.0 mV, Impedance: 400 ohms,  Right Ventricle:  Amplitude: 12.0 mV, Impedance: 530 ohms, Threshold: 0.75 V at 0.5 msec Left Ventricle:  Impedance: 850 ohms, Threshold: 0.75 V at 0.5 msec Shock Impedance: 43 ohms   Episodes MS Episodes:  2     Percent Mode Switch:  100%     Shock:  0     ATP:  0     Nonsustained:  0     Atrial Therapies:  0 Atrial Pacing:  <1%     Ventricular Pacing:  99%  Brady Parameters Mode DDD     Lower Rate Limit:  60     Upper Rate Limit 130 PAV 180     Sensed AV Delay:  160  Tachy Zones VF:  222     Tech Comments:  WOUND CHECK--STERI STRIPS REMOVED.  WOUND WITHOUT REDNESS OR EDEMA.  NORMAL DEVICE FUNCTION.  PT IN MODE SWITCH 100% OF TIME. + COUMADIN.  PT TRIGGER PACES 28% OF TIME.  DEMONSTRATED NOTIFIER FOR PT.  NO CHANGES MADE. ROV 09-16-09 W/SK. Vella Kohler  June 25, 2009 3:53 PM

## 2010-02-19 NOTE — Letter (Signed)
Summary: Implantable Device Instructions  [facility]ural [facility], Main Office  [address]   [place], [place] [address]   Phone: [phone]  Fax: [phone]      Implantable Device Instructions  You are scheduled for:  _____ Permanent Transvenous Pacemaker _____ Implantable Cardioverter Defibrillator _____ Implantable Loop Recorder _____ Generator Change __X___ DFT Testing  on [facility], July 30, 2009 with Dr. Graciela Husbands.  1.  Please arrive at the Short Stay Center at [facility] at 10 AM on the day of your procedure.  2.  Do not eat or drink after midnight the night before your procedure.  3.  Complete lab work on July 23, 2009.  The lab at [facility] is open from 8:30 AM to 1:30 PM and from 2:30 PM to 5:00 PM.  The lab at [facility] is open from 7:30 AM to 5:30 PM.  You do not have to be fasting.  4.  Ok to take medications morning of procedure with water.  5.  Bring your insurance cards and a list of your medications.    *If you have ANY questions after you get home, please call the office [phone].  *Every attempt is made to prevent procedures from being rescheduled.  Due to the nauture of Electrophysiology, rescheduling can happen.  The physician is always aware and directs the staff when this occurs.

## 2010-02-19 NOTE — Medication Information (Signed)
Summary: rov/sp  Anticoagulant Therapy  Managed by: Eda Keys, PharmD Referring MD: Dr Antoine Poche PCP: Ace Gins, MD Supervising MD: Juanda Chance MD, Ethridge Sollenberger Indication 1: Cerebrovascular Accident Lab Used: [facility] [place] Site: [address] INR POC 1.5 INR RANGE 2.0-3.0  Dietary changes: no    Health status changes: no    Bleeding/hemorrhagic complications: no    Recent/future hospitalizations: no    Any changes in medication regimen? no    Recent/future dental: no  Any missed doses?: yes     Details: missed one dose 1 week ago  Is patient compliant with meds? yes       Current Medications (verified): 1)  Levothyroxine Sodium 75 Mcg Tabs (Levothyroxine Sodium) .Marland Kitchen.. 1 By Mouth Daily 2)  Digoxin 0.25 Mg Tabs (Digoxin) .Marland Kitchen.. 1 By Mouth Daily 3)  Warfarin Sodium 5 Mg Tabs (Warfarin Sodium) .... One By Mouth Daily or As Directed 4)  Cymbalta 30 Mg Cpep (Duloxetine Hcl) .Marland Kitchen.. 1 By Mouth Daili 5)  Toprol Xl 50 Mg Xr24h-Tab (Metoprolol Succinate) .... One Twice A Day 6)  Spironolactone 50 Mg Tabs (Spironolactone) .... 1/2 Tablet Daily 7)  Warfarin Sodium 7.5 Mg Tabs (Warfarin Sodium) .Marland Kitchen.. 1 By Mouth As Directed 8)  Acyclovir 400 Mg Tabs (Acyclovir) .... Take 1 Tablet By Mouth Two Times A Day 9)  Furosemide 40 Mg Tabs (Furosemide) .... 2 By Mouth Daily 10)  Enalapril Maleate 5 Mg Tabs (Enalapril Maleate) .... One Twice A Day 11)  Aspirin 81 Mg  Tabs (Aspirin) .Marland Kitchen.. 1 By Mouth Daily 12)  Tramadol Hcl 50 Mg Tabs (Tramadol Hcl) .... One or Two Every 8 Hours As Needed For Pain  Allergies: No Known Drug Allergies  Anticoagulation Management History:      The patient is taking warfarin and comes in today for a routine follow up visit.  Negative risk factors for bleeding include an age less than 44 years old.  The bleeding index is 'low risk'.  Positive CHADS2 values include History of CHF and History of HTN.  Negative CHADS2 values include Age > 63 years old.  His  last INR was 1.0 ratio.  Anticoagulation responsible provider: Juanda Chance MD, Smitty Cords.  INR POC: 1.5.  Cuvette Lot#: [id number].  Exp: 06/2010.    Anticoagulation Management Assessment/Plan:      The patient's current anticoagulation dose is Warfarin sodium 5 mg tabs: one by mouth daily or as directed, Warfarin sodium 7.5 mg tabs: 1 by mouth as directed.  The target INR is 2.0-3.0.  The next INR is due 06/04/2009.  Anticoagulation instructions were given to patient.  Results were reviewed/authorized by Eda Keys, PharmD.  He was notified by Eda Keys.         Prior Anticoagulation Instructions: INR 1.7  Increase dose to 1 1/2 tablets every day except 2 tablets on Monday   Current Anticoagulation Instructions: INR 1.5  Start NEW dosing schedule of 2 tablets on Monday and Wednesday and 1.5 tablets all other days.  Return to clinic in 2 weeks.

## 2010-02-19 NOTE — Letter (Signed)
Summary: Return To [employer], Main Office  [address]   [place], [place] [address]   Phone: [phone]  Fax: [phone]    05/05/2009  TO: Leodis Sias IT MAY CONCERN   RE: Raymond [name] [address] [place]rippe   The above named individual is under my medical care and may return to the gym for exercise.  If you have any further questions or need additional information, please call.     Sincerely,    Charolotte Capuchin, RN for Dr. [name]

## 2010-02-19 NOTE — Miscellaneous (Signed)
Summary: Progress Note  Progress Note   Imported By: Roderic Ovens 02/06/2009 12:07:31  _____________________________________________________________________  External Attachment:    Type:   Image     Comment:   External Document

## 2010-02-19 NOTE — Medication Information (Signed)
Summary: rov/sp  Anticoagulant Therapy  Managed by: Weston Brass, PharmD Referring MD: Dr Antoine Poche PCP: Ace Gins, MD Supervising MD: Gala Romney MD, Reuel Boom Indication 1: Cerebrovascular Accident Indication 2: Atrial Fibrillation Lab Used: [facility] [place] Site: [address] INR POC 1.1 INR RANGE 2.0-3.0  Dietary changes: no    Health status changes: no    Bleeding/hemorrhagic complications: no    Recent/future hospitalizations: no    Any changes in medication regimen? yes       Details: Increased levothyroxine to 137  Recent/future dental: no  Any missed doses?: yes     Details: Pt has missed a "couple" doses in the past week or so  Is patient compliant with meds? no      Comments: Pt repots problems w/ compliance 2/2 to memory and cost of obtaining medication. Pt was provided with sample of 2.5 mg tablets & directions on white pad with how to dose w/ these new tablets. A new rx for 7.5 mg tab were also sent into [facility]. Pt was advised to use a timer to remember medicaiton and that warfarin was a $4 med at [facility]. Pt understands the risk of INR being low.  Allergies: No Known Drug Allergies  Anticoagulation Management History:      The patient is taking warfarin and comes in today for a routine follow up visit.  Negative risk factors for bleeding include an age less than 50 years old.  The bleeding index is 'low risk'.  Positive CHADS2 values include History of CHF and History of HTN.  Negative CHADS2 values include Age > 39 years old.  His last INR was 3.1 ratio.  Anticoagulation responsible Stran Raper: Bensimhon MD, Reuel Boom.  INR POC: 1.1.  Cuvette Lot#: [id number].  Exp: 12/2010.    Anticoagulation Management Assessment/Plan:      The patient's current anticoagulation dose is Warfarin sodium 5 mg tabs: one by mouth daily or as directed, Warfarin sodium 7.5 mg tabs: 1 by mouth as directed.  The target INR is 2.0-3.0.  The next INR is due 12/24/2009.   Anticoagulation instructions were given to patient.  Results were reviewed/authorized by Weston Brass, PharmD.  He was notified by Hoy Register, PharmD Candidate.         Prior Anticoagulation Instructions: INR 1.1  Take 2 tablets today and tomorrow then increase dose to 1 1/2 tablets every day.  Recheck INR in 1 week.   Current Anticoagulation Instructions: INR 1.1 Take 2.5 tablets today and tomorrow, take 2 tablet on Friday, then resume previous dose of 1.5 tablets everyday Recheck INR in 1 week Prescriptions: WARFARIN SODIUM 7.5 MG TABS (WARFARIN SODIUM) 1 by mouth as directed  #30 x 2   Entered by:   Reina Fuse PharmD   Authorized by:   Dolores Patty, MD, [facility]   Signed by:   Reina Fuse PharmD on 12/17/2009   Method used:   Electronically to        [facility] [address] (retail)       [phone]       [place], [place]  [address]       Ph: [phone]       Fax: [phone]   RxID:   [id number]

## 2010-02-19 NOTE — Letter (Signed)
Summary: [facility] CASE MANAGEMENT INFO FORM  [facility] MANAG[facility] INFO [facility]   Imported By: Marylou Mccoy 01/28/2009 18:41:19  _____________________________________________________________________  External Attachment:    Type:   Image     Comment:   External Document

## 2010-02-19 NOTE — Progress Notes (Signed)
Summary: "passed out"  Phone Note Other Incoming   Caller: Pt walked in Details for Reason: "passed out" Summary of Call: Pt here with his mother, states 2 Saturdays ago he "passed out".  States he had family at the house and he was walking around with them inside when he felt dizzy and the next thing he remembers is waking up on the floor.  He had no obvious signs of injury.  States he has not been dizzy since then but frequently he "just dont feel good."  It speaking with the pt and his mother it is determined that he is not eating and drinking properly.  (He reports having one Diet Mt Dew and an order of hashbrowns from [facility] and it 4:15pm)  Mother states because he lives alone and has right sided defict, he is unable to prepare food for himself.  Pt states he has trouble getting food into the house because he cant get it in easily (He gets tired quickly trying to carry it into the house).  Mother states she will help pt with getting food.  Pt encoaraged to try to keep foods easy to pick up and eat ei. nuts, fruit etc.  also to keep bottled waters if possible, so that it will be more easy for him to reach and open.  Pt states understanding.  Informed pt that I will make Dr Antoine Poche aware of the above and call him back with any changes or orders  Follow-up for Phone Call        I spoke again with the patient about my concern and the need for him to have an ICD.  He has had no further events.  He continues to refuse any further treatment for this event although he will consider more strongly the need for ICD.  He understands the risk of sudden death.  I have had this conversation in no uncertain terms. Follow-up by: [name], MD, [facility],  May 20, 2009 1:23 PM

## 2010-02-19 NOTE — Miscellaneous (Signed)
Summary: Orders Update  Clinical Lists Changes  Orders: Added new Test order of TLB-CBC Platelet - w/Differential (85025-CBCD) - Signed 

## 2010-02-19 NOTE — Medication Information (Signed)
Summary: rov/tm  Anticoagulant Therapy  Managed by: Elaina Pattee, PharmD Referring MD: Dr Su Monks MD: Daleen Squibb MD, Maisie Fus Indication 1: Cerebrovascular Accident Lab Used: [facility] [place] Site: [address] INR POC 1.7 INR RANGE 2.0-3.0  Dietary changes: no    Health status changes: yes       Details: Pt has had headaches for the last 2-3 days that are not relieved by Tylenol.  The pain is in the frontal region.  Nothing relieves pain.  Bleeding/hemorrhagic complications: no    Recent/future hospitalizations: no    Any changes in medication regimen? no    Recent/future dental: no  Any missed doses?: no       Is patient compliant with meds? yes      Comments: BP 120/84 mmHg in right arm.  Has taken BP meds this am.  Instructed pt to try an antihistamine due to location and onset of HA.  If HA persist or want to be seen, call PCP.  Allergies: No Known Drug Allergies  Anticoagulation Management History:      Negative risk factors for bleeding include an age less than 42 years old.  The bleeding index is 'low risk'.  Positive CHADS2 values include History of CHF and History of HTN.  Negative CHADS2 values include Age > 89 years old.  His last INR was 1.0 ratio.  Anticoagulation responsible provider: Daleen Squibb MD, Maisie Fus.  INR POC: 1.7.  Cuvette Lot#: [id number].  Exp: 05/2010.    Anticoagulation Management Assessment/Plan:      The patient's current anticoagulation dose is Warfarin sodium 5 mg tabs: one by mouth daily or as directed, Warfarin sodium 7.5 mg tabs: 1 by mouth as directed.  The target INR is 2.0-3.0.  The next INR is due 05/05/2009.  Anticoagulation instructions were given to patient.  Results were reviewed/authorized by Elaina Pattee, PharmD.  He was notified by Elaina Pattee, PharmD.         Prior Anticoagulation Instructions: INR 2.0 Change dose to 1.5 pills everyday except 1 pill on Thurdays. Recheck in 2 weeks. Pt. will incorporate some  salads in per week.   Current Anticoagulation Instructions: INR 1.7. Take 2 tablets today, then take 1.5 tablets daily.  Recheck 05/05/09 at 10:15.

## 2010-02-19 NOTE — Medication Information (Signed)
Summary: rov/td  Anticoagulant Therapy  Managed by: Jeralene Peters, PharmD Referring MD: Dr Su Monks MD: Excell Seltzer MD, Casimiro Needle Indication 1: Cerebrovascular Accident Lab Used: [facility] [place] Site: [address] INR POC 1.7 INR RANGE 2.0-3.0  Dietary changes: no    Health status changes: no    Bleeding/hemorrhagic complications: no    Recent/future hospitalizations: no    Any changes in medication regimen? no    Recent/future dental: no  Any missed doses?: no       Is patient compliant with meds? yes       Current Medications (verified): 1)  Levothyroxine Sodium 75 Mcg Tabs (Levothyroxine Sodium) .Marland Kitchen.. 1 By Mouth Daily 2)  Digoxin 0.25 Mg Tabs (Digoxin) .Marland Kitchen.. 1 By Mouth Daily 3)  Atacand 16 Mg Tabs (Candesartan Cilexetil) .... One By Mouth Daily 4)  Warfarin Sodium 5 Mg Tabs (Warfarin Sodium) .... One By Mouth Daily or As Directed 5)  Cymbalta 30 Mg Cpep (Duloxetine Hcl) .Marland Kitchen.. 1 By Mouth Daili 6)  Plavix 75 Mg Tabs (Clopidogrel Bisulfate) .Marland Kitchen.. 1 By Mouth Daily 7)  Toprol Xl 50 Mg Xr24h-Tab (Metoprolol Succinate) .... One Twice A Day 8)  Spironolactone 50 Mg Tabs (Spironolactone) .... 1/2 Tablet Daily 9)  Acyclovir 200 Mg Caps (Acyclovir) .... 2 By Mouth Two Times A Day 10)  Warfarin Sodium 7.5 Mg Tabs (Warfarin Sodium) .Marland Kitchen.. 1 By Mouth As Directed  Allergies (verified): No Known Drug Allergies  Anticoagulation Management History:      The patient is taking warfarin and comes in today for a routine follow up visit.  Negative risk factors for bleeding include an age less than 30 years old.  The bleeding index is 'low risk'.  Positive CHADS2 values include History of CHF and History of HTN.  Negative CHADS2 values include Age > 9 years old.  His last INR was 1.0 ratio.  Anticoagulation responsible provider: Excell Seltzer MD, Casimiro Needle.  INR POC: 1.7.  Cuvette Lot#: [id number].  Exp: 04/2010.    Anticoagulation Management Assessment/Plan:      The patient's  current anticoagulation dose is Warfarin sodium 5 mg tabs: one by mouth daily or as directed, Warfarin sodium 7.5 mg tabs: 1 by mouth as directed.  The next INR is due 03/13/2009.  Anticoagulation instructions were given to spouse.  Results were reviewed/authorized by Jeralene Peters, PharmD.         Prior Anticoagulation Instructions: INR 1.5  Take 1.5 tabs today then increase to 1 tab daily.  Return to office on 02/27/2009 for recheck.    Current Anticoagulation Instructions: INR 1.7  TAKE AN EXTRA 1/2 TABLET TONIGHT.  THEN TAKE 1 TAB EVERYDAY EXCEPT TAKE 1.5 TABS ON MONDAY.  RECHECK IN 3 WEEKS.

## 2010-02-23 ENCOUNTER — Telehealth: Payer: Self-pay | Admitting: Cardiology

## 2010-02-25 ENCOUNTER — Encounter: Payer: Self-pay | Admitting: Internal Medicine

## 2010-02-25 ENCOUNTER — Encounter (INDEPENDENT_AMBULATORY_CARE_PROVIDER_SITE_OTHER): Payer: Medicare Other

## 2010-02-25 DIAGNOSIS — Z7901 Long term (current) use of anticoagulants: Secondary | ICD-10-CM

## 2010-02-25 DIAGNOSIS — I4891 Unspecified atrial fibrillation: Secondary | ICD-10-CM

## 2010-03-03 DIAGNOSIS — I4891 Unspecified atrial fibrillation: Secondary | ICD-10-CM

## 2010-03-05 NOTE — Medication Information (Signed)
Summary: [facility]  Anticoagulant Therapy  Managed by: Weston Brass, PharmD Referring MD: Dr Antoine Poche PCP: Ace Gins, MD Supervising MD: Graciela Husbands MD, Viviann Spare Indication 1: Cerebrovascular Accident Indication 2: Atrial Fibrillation Lab Used: [facility] [place] Site: [address] INR POC 4.5 INR RANGE 2.0-3.0  Dietary changes: yes       Details: eating less greens than normal   Health status changes: yes       Details: Recent fall on 02/23/2010 while getting up from a chair.  Patient lost balance.   Bleeding/hemorrhagic complications: no    Recent/future hospitalizations: no    Any changes in medication regimen? yes       Details: Started taking APAP for the pain from the fall.   Recent/future dental: no  Any missed doses?: yes     Details: Missed Coumadin on Monday  Is patient compliant with meds? yes       Allergies: No Known Drug Allergies  Anticoagulation Management History:      The patient is taking warfarin and comes in today for a routine follow up visit.  Negative risk factors for bleeding include an age less than 63 years old.  The bleeding index is 'low risk'.  Positive CHADS2 values include History of CHF and History of HTN.  Negative CHADS2 values include Age > 22 years old.  His last INR was 3.1 ratio.  Anticoagulation responsible provider: Graciela Husbands MD, Viviann Spare.  INR POC: 4.5.  Cuvette Lot#: [id number].  Exp: 02/2011.    Anticoagulation Management Assessment/Plan:      The patient's current anticoagulation dose is Warfarin sodium 5 mg tabs: one by mouth daily or as directed, Warfarin sodium 7.5 mg tabs: 1 by mouth as directed.  The target INR is 2.0-3.0.  The next INR is due 03/11/2010.  Anticoagulation instructions were given to patient.  Results were reviewed/authorized by Weston Brass, PharmD.  He was notified by Margot Chimes PharmD Candidate.         Prior Anticoagulation Instructions: INR 2.6  Coumadin 7.5 mg tablets - Continue to take 1.5  tablet every day, except 1 tablet on Tuesdays, Thursdays, and Saturdays   Current Anticoagulation Instructions: INR: 4.5   Do not take your Coumadin today.  Tomorrow restart your Coumadin and take 1 tablet everyday except 1 1/2 tablets on Mondays, Wednesday, and Fridays.  Recheck in 2 weeks.

## 2010-03-05 NOTE — Progress Notes (Signed)
Summary: rx refill  Phone Note Refill Request Message from:  Patient on February 23, 2010 3:17 PM  Refills Requested: Medication #1:  DIGOXIN 0.25 MG TABS 1 by mouth daily [facility]  [phone]   Method Requested: Pick up at Office Initial call taken by: Roe Coombs,  February 23, 2010 3:17 PM  Follow-up for Phone Call        Spoke with pt. Sent in RX to pharmacy. Marrion Coy, CNA  February 23, 2010 3:22 PM  Follow-up by: Marrion Coy, CNA,  February 23, 2010 3:22 PM    Prescriptions: DIGOXIN 0.25 MG TABS (DIGOXIN) 1 by mouth daily  #30 x 6   Entered by:   Marrion Coy, CNA   Authorized by:   [name], MD, [facility]   Signed by:   Marrion Coy, CNA on 02/23/2010   Method used:   Electronically to        [facility]* (retail)       [address]       [place], [place]  [address]       Ph: [phone]       Fax: [phone]   RxID:   [id number]   Appended Document: rx refill    Clinical Lists Changes   Per Dr. Antoine Poche Pt is to take Digoxin 0.125mg  1 by mouth daily until we see him again.

## 2010-03-11 ENCOUNTER — Encounter: Payer: Self-pay | Admitting: Cardiology

## 2010-03-11 ENCOUNTER — Encounter (INDEPENDENT_AMBULATORY_CARE_PROVIDER_SITE_OTHER): Payer: Medicare Other

## 2010-03-11 DIAGNOSIS — I4891 Unspecified atrial fibrillation: Secondary | ICD-10-CM

## 2010-03-11 DIAGNOSIS — Z7901 Long term (current) use of anticoagulants: Secondary | ICD-10-CM

## 2010-03-11 LAB — CONVERTED CEMR LAB: POC INR: 1.4

## 2010-03-17 NOTE — Medication Information (Signed)
Summary: rov/cb  Anticoagulant Therapy  Managed by: Windell Hummingbird, RN Referring MD: Dr Antoine Poche PCP: Ace Gins, MD Supervising MD: Shirlee Latch MD, Aracelly Tencza Indication 1: Cerebrovascular Accident Indication 2: Atrial Fibrillation Lab Used: [facility] [place] Site: [address] INR POC 1.4 INR RANGE 2.0-3.0  Dietary changes: no    Health status changes: no    Bleeding/hemorrhagic complications: no    Recent/future hospitalizations: no    Any changes in medication regimen? no    Recent/future dental: no  Any missed doses?: yes     Details: Stopped all meds Monday  Is patient compliant with meds? no     Details: Stopped all meds Monday   Allergies: No Known Drug Allergies  Anticoagulation Management History:      The patient is taking warfarin and comes in today for a routine follow up visit.  Negative risk factors for bleeding include an age less than 48 years old.  The bleeding index is 'low risk'.  Positive CHADS2 values include History of CHF and History of HTN.  Negative CHADS2 values include Age > 72 years old.  His last INR was 3.1 ratio.  Anticoagulation responsible provider: Shirlee Latch MD, Shammond Arave.  INR POC: 1.4.  Cuvette Lot#: [id number].  Exp: 01/2011.    Anticoagulation Management Assessment/Plan:      The patient's current anticoagulation dose is Warfarin sodium 5 mg tabs: one by mouth daily or as directed, Warfarin sodium 7.5 mg tabs: 1 by mouth as directed.  The target INR is 2.0-3.0.  The next INR is due 03/20/2010.  Anticoagulation instructions were given to patient.  Results were reviewed/authorized by Windell Hummingbird, RN.  He was notified by Windell Hummingbird, RN.         Prior Anticoagulation Instructions: INR: 4.5   Do not take your Coumadin today.  Tomorrow restart your Coumadin and take 1 tablet everyday except 1 1/2 tablets on Mondays, Wednesday, and Fridays.  Recheck in 2 weeks.   Current Anticoagulation Instructions: INR 1.4 Take 2 tablets today and 2  tablets tomorrow.  Then resume taking 1 tablet every day, except take 1 1/2 tablets Mondays, Wednesdays, and Fridays. Recheck in 10 days.

## 2010-03-19 ENCOUNTER — Encounter (INDEPENDENT_AMBULATORY_CARE_PROVIDER_SITE_OTHER): Payer: Medicare Other

## 2010-03-19 ENCOUNTER — Encounter: Payer: Self-pay | Admitting: Internal Medicine

## 2010-03-19 DIAGNOSIS — Z7901 Long term (current) use of anticoagulants: Secondary | ICD-10-CM

## 2010-03-19 DIAGNOSIS — I428 Other cardiomyopathies: Secondary | ICD-10-CM

## 2010-03-19 DIAGNOSIS — I6789 Other cerebrovascular disease: Secondary | ICD-10-CM

## 2010-03-19 LAB — CONVERTED CEMR LAB: POC INR: 4.6

## 2010-03-26 NOTE — Medication Information (Signed)
Summary: rov/pc pt rs appt same day as apcer ck/mt  Anticoagulant Therapy  Managed by: Windell Hummingbird, RN Referring MD: Dr Antoine Poche PCP: Ace Gins, MD Supervising MD: Johney Frame MD, Fayrene Fearing Indication 1: Cerebrovascular Accident Indication 2: Atrial Fibrillation Lab Used: [facility] [place] Site: [address] INR POC 4.6 INR RANGE 2.0-3.0  Dietary changes: no    Health status changes: no    Bleeding/hemorrhagic complications: no    Recent/future hospitalizations: no    Any changes in medication regimen? no    Recent/future dental: no  Any missed doses?: no       Is patient compliant with meds? yes       Allergies: No Known Drug Allergies  Anticoagulation Management History:      The patient is taking warfarin and comes in today for a routine follow up visit.  Negative risk factors for bleeding include an age less than 26 years old.  The bleeding index is 'low risk'.  Positive CHADS2 values include History of CHF and History of HTN.  Negative CHADS2 values include Age > 51 years old.  His last INR was 3.1 ratio.  Anticoagulation responsible provider: Makayla Confer MD, Fayrene Fearing.  INR POC: 4.6.  Cuvette Lot#: [id number].  Exp: 01/2011.    Anticoagulation Management Assessment/Plan:      The patient's current anticoagulation dose is Warfarin sodium 5 mg tabs: one by mouth daily or as directed, Warfarin sodium 7.5 mg tabs: 1 by mouth as directed.  The target INR is 2.0-3.0.  The next INR is due 04/02/2010.  Anticoagulation instructions were given to patient.  Results were reviewed/authorized by Windell Hummingbird, RN.  He was notified by Windell Hummingbird, RN.         Prior Anticoagulation Instructions: INR 1.4 Take 2 tablets today and 2 tablets tomorrow.  Then resume taking 1 tablet every day, except take 1 1/2 tablets Mondays, Wednesdays, and Fridays. Recheck in 10 days.  Current Anticoagulation Instructions: INR 4.6 Skip today and tomorrow.  Then resume taking 1 tablet every day,  except take 1 1/2 tablet Mondays, Wednesdays, and Fridays. Recheck in 2 weeks.

## 2010-03-31 NOTE — Procedures (Signed)
Summary: pacer cl/coumadin to follow/mt   Current Medications (verified): 1)  Levothroid 137 Mcg Tabs (Levothyroxine Sodium) .Marland Kitchen.. 1 By Mouth Daily 2)  Digoxin 0.25 Mg Tabs (Digoxin) .Marland Kitchen.. 1 By Mouth Daily 3)  Warfarin Sodium 5 Mg Tabs (Warfarin Sodium) .... One By Mouth Daily or As Directed 4)  Toprol Xl 50 Mg Xr24h-Tab (Metoprolol Succinate) .... One Twice A Day 5)  Spironolactone 50 Mg Tabs (Spironolactone) .... 1/2 Tablet Daily 6)  Warfarin Sodium 7.5 Mg Tabs (Warfarin Sodium) .Marland Kitchen.. 1 By Mouth As Directed 7)  Acyclovir 400 Mg Tabs (Acyclovir) .... Take 1 Tablet By Mouth Two Times A Day 8)  Furosemide 40 Mg Tabs (Furosemide) .... As Needed 9)  Enalapril Maleate 5 Mg Tabs (Enalapril Maleate) .... One Twice A Day 10)  Tramadol Hcl 50 Mg Tabs (Tramadol Hcl) .... One or Two Every 8 Hours As Needed For Pain 11)  Amiodarone Hcl 200 Mg Tabs (Amiodarone Hcl) .... Take One Tablet Once Daily  Allergies (verified): No Known Drug Allergies   ICD Specifications Following MD:  Sherryl Manges, MD     ICD Vendor:  [employer]     ICD Model Number:  [phone]     ICD Serial Number:  [id number] ICD DOI:  06/09/2009     ICD Implanting MD:  Sherryl Manges, MD  Lead 1:    Location: RA     DOI: 06/09/2009     Model #: [id number]     Serial #: [id number]     Status: active Lead 2:    Location: RV     DOI: 06/09/2009     Model #: 0865     Serial #: [id number]     Status: active Lead 3:    Location: LV     DOI: 06/09/2009     Model #: [id number]     Serial #: [id number]     Status: active  Indications::  CM   Brady Parameters Mode DDD     Lower Rate Limit:  60     Upper Rate Limit 130 PAV 180     Sensed AV Delay:  160  Tachy Zones VF:  240     VT:  200     VT1:  171     Tech Comments:  see paceart report. Vella Kohler  March 19, 2010 5:15 PM

## 2010-04-01 ENCOUNTER — Encounter (INDEPENDENT_AMBULATORY_CARE_PROVIDER_SITE_OTHER): Payer: Medicare Other

## 2010-04-01 ENCOUNTER — Encounter: Payer: Self-pay | Admitting: Cardiology

## 2010-04-01 DIAGNOSIS — Z7901 Long term (current) use of anticoagulants: Secondary | ICD-10-CM

## 2010-04-01 DIAGNOSIS — I6789 Other cerebrovascular disease: Secondary | ICD-10-CM

## 2010-04-01 DIAGNOSIS — I4891 Unspecified atrial fibrillation: Secondary | ICD-10-CM

## 2010-04-01 LAB — CONVERTED CEMR LAB: POC INR: 1.4

## 2010-04-05 LAB — BASIC METABOLIC PANEL
GFR calc Af Amer: >60 mL/min (ref >60)
GFR calc non Af Amer: >60 mL/min (ref >60)
Potassium: 5.6 mEq/L — ABNORMAL HIGH (ref 3.5–5.1)
Sodium: 139 mEq/L (ref 135–145)

## 2010-04-05 LAB — DIFFERENTIAL
Basophils Absolute: 0.1 K/uL (ref 0.0–0.1)
Basophils Relative: 1 % (ref 0–1)
Eosinophils Absolute: 0.1 10*3/uL (ref 0.0–0.7)
Eosinophils Relative: 1 % (ref 0–5)
Lymphocytes Relative: 8 % — ABNORMAL LOW (ref 12–46)
Lymphs Abs: 0.9 10*3/uL (ref 0.7–4.0)
Monocytes Absolute: 0.9 K/uL (ref 0.1–1.0)
Monocytes Relative: 8 % (ref 3–12)
Neutro Abs: 9.7 K/uL — ABNORMAL HIGH (ref 1.7–7.7)
Neutrophils Relative %: 84 % — ABNORMAL HIGH (ref 43–77)

## 2010-04-05 LAB — CBC
HCT: 39.9 % (ref 39.0–52.0)
Hemoglobin: 13.9 g/dL (ref 13.0–17.0)
MCH: 33.8 pg (ref 26.0–34.0)
MCHC: 34.8 g/dL (ref 30.0–36.0)
MCV: 97.2 fL (ref 78.0–100.0)
Platelets: 216 K/uL (ref 150–400)
RBC: 4.11 MIL/uL — ABNORMAL LOW (ref 4.22–5.81)
RDW: 15.5 % (ref 11.5–15.5)
WBC: 11.7 10*3/uL — ABNORMAL HIGH (ref 4.0–10.5)

## 2010-04-05 LAB — BASIC METABOLIC PANEL WITH GFR
BUN: 18 mg/dL (ref 6–23)
CO2: 23 meq/L (ref 19–32)
Calcium: 9.3 mg/dL (ref 8.4–10.5)
Chloride: 108 meq/L (ref 96–112)
Creatinine, Ser: 1.28 mg/dL (ref 0.4–1.5)
Glucose, Bld: 117 mg/dL — ABNORMAL HIGH (ref 70–99)

## 2010-04-05 LAB — PROTIME-INR
INR: 2.67 — ABNORMAL HIGH (ref 0.00–1.49)
Prothrombin Time: 28.2 s — ABNORMAL HIGH (ref 11.6–15.2)

## 2010-04-05 LAB — POCT CARDIAC MARKERS
CKMB, poc: 1.9 ng/mL (ref 1.0–8.0)
Myoglobin, poc: 186 ng/mL (ref 12–200)
Troponin i, poc: <0.05 ng/mL (ref 0.00–0.09)

## 2010-04-05 LAB — T3: T3, Total: 64.1 ng/dl — ABNORMAL LOW (ref 80.0–204.0)

## 2010-04-06 LAB — T4, FREE: Free T4: 0.96 ng/dL (ref 0.80–1.80)

## 2010-04-06 LAB — PROTIME-INR
INR: 2.03 — ABNORMAL HIGH (ref 0.00–1.49)
INR: 2.28 — ABNORMAL HIGH (ref 0.00–1.49)
INR: 2.4 — ABNORMAL HIGH (ref 0.00–1.49)
INR: 3.99 — ABNORMAL HIGH (ref 0.00–1.49)
Prothrombin Time: 22.8 seconds — ABNORMAL HIGH (ref 11.6–15.2)
Prothrombin Time: 23.3 seconds — ABNORMAL HIGH (ref 11.6–15.2)
Prothrombin Time: 23.9 seconds — ABNORMAL HIGH (ref 11.6–15.2)
Prothrombin Time: 24.9 seconds — ABNORMAL HIGH (ref 11.6–15.2)
Prothrombin Time: 31.2 seconds — ABNORMAL HIGH (ref 11.6–15.2)
Prothrombin Time: 38.6 seconds — ABNORMAL HIGH (ref 11.6–15.2)

## 2010-04-06 LAB — POCT I-STAT, CHEM 8
BUN: 17 mg/dL (ref 6–23)
Calcium, Ion: 1.14 mmol/L (ref 1.12–1.32)
Creatinine, Ser: 1.3 mg/dL (ref 0.4–1.5)
Hemoglobin: 16.7 g/dL (ref 13.0–17.0)
Sodium: 140 mEq/L (ref 135–145)
TCO2: 24 mmol/L (ref 0–100)

## 2010-04-06 LAB — APTT: aPTT: 41 seconds — ABNORMAL HIGH (ref 24–37)

## 2010-04-06 LAB — DIFFERENTIAL
Basophils Absolute: 0 10*3/uL (ref 0.0–0.1)
Basophils Relative: 0 % (ref 0–1)
Eosinophils Relative: 0 % (ref 0–5)
Lymphocytes Relative: 6 % — ABNORMAL LOW (ref 12–46)
Monocytes Absolute: 0.9 10*3/uL (ref 0.1–1.0)
Neutro Abs: 12.5 10*3/uL — ABNORMAL HIGH (ref 1.7–7.7)

## 2010-04-06 LAB — BASIC METABOLIC PANEL
BUN: 15 mg/dL (ref 6–23)
BUN: 22 mg/dL (ref 6–23)
CO2: 31 mEq/L (ref 19–32)
CO2: 32 mEq/L (ref 19–32)
Calcium: 10 mg/dL (ref 8.4–10.5)
Calcium: 9.2 mg/dL (ref 8.4–10.5)
Calcium: 9.5 mg/dL (ref 8.4–10.5)
Chloride: 100 mEq/L (ref 96–112)
Creatinine, Ser: 1.35 mg/dL (ref 0.4–1.5)
Creatinine, Ser: 1.4 mg/dL (ref 0.4–1.5)
Creatinine, Ser: 1.63 mg/dL — ABNORMAL HIGH (ref 0.4–1.5)
GFR calc Af Amer: 55 mL/min — ABNORMAL LOW (ref >60)
GFR calc non Af Amer: 46 mL/min — ABNORMAL LOW (ref >60)
GFR calc non Af Amer: 57 mL/min — ABNORMAL LOW (ref >60)
Glucose, Bld: 112 mg/dL — ABNORMAL HIGH (ref 70–99)
Sodium: 139 mEq/L (ref 135–145)

## 2010-04-06 LAB — CBC
HCT: 44.5 % (ref 39.0–52.0)
HCT: 44.9 % (ref 39.0–52.0)
Hemoglobin: 16.5 g/dL (ref 13.0–17.0)
MCHC: 33.4 g/dL (ref 30.0–36.0)
MCHC: 34.1 g/dL (ref 30.0–36.0)
Platelets: 210 10*3/uL (ref 150–400)
Platelets: 211 10*3/uL (ref 150–400)
RBC: 5.21 MIL/uL (ref 4.22–5.81)
RDW: 16.7 % — ABNORMAL HIGH (ref 11.5–15.5)
RDW: 16.9 % — ABNORMAL HIGH (ref 11.5–15.5)
RDW: 17.1 % — ABNORMAL HIGH (ref 11.5–15.5)
WBC: 8.4 10*3/uL (ref 4.0–10.5)

## 2010-04-06 LAB — POCT CARDIAC MARKERS: Myoglobin, poc: 206 ng/mL (ref 12–200)

## 2010-04-06 LAB — BRAIN NATRIURETIC PEPTIDE: Pro B Natriuretic peptide (BNP): 320 pg/mL — ABNORMAL HIGH (ref 0.0–100.0)

## 2010-04-06 LAB — MAGNESIUM: Magnesium: 2.3 mg/dL (ref 1.5–2.5)

## 2010-04-06 LAB — TSH: TSH: 13.771 u[IU]/mL — ABNORMAL HIGH (ref 0.350–4.500)

## 2010-04-07 NOTE — Medication Information (Signed)
Summary: rov/pc  Anticoagulant Therapy  Managed by: Bethena Midget, RN, BSN Referring MD: Dr Antoine Poche PCP: Ace Gins, MD Supervising MD: Riley Kill MD, Maisie Fus Indication 1: Cerebrovascular Accident Indication 2: Atrial Fibrillation Lab Used: [facility] [place] Site: [address] INR POC 1.4 INR RANGE 2.0-3.0  Dietary changes: no    Health status changes: no    Bleeding/hemorrhagic complications: no    Recent/future hospitalizations: no    Any changes in medication regimen? no    Recent/future dental: no  Any missed doses?: yes     Details: Missed Sunday and Mondays dose  Is patient compliant with meds? yes       Allergies: No Known Drug Allergies  Anticoagulation Management History:      The patient is taking warfarin and comes in today for a routine follow up visit.  Negative risk factors for bleeding include an age less than 86 years old.  The bleeding index is 'low risk'.  Positive CHADS2 values include History of CHF and History of HTN.  Negative CHADS2 values include Age > 21 years old.  His last INR was 3.1 ratio.  Anticoagulation responsible provider: Riley Kill MD, Maisie Fus.  INR POC: 1.4.  Cuvette Lot#: [id number].  Exp: 02/2011.    Anticoagulation Management Assessment/Plan:      The patient's current anticoagulation dose is Warfarin sodium 5 mg tabs: one by mouth daily or as directed, Warfarin sodium 7.5 mg tabs: 1 by mouth as directed.  The target INR is 2.0-3.0.  The next INR is due 04/15/2010.  Anticoagulation instructions were given to patient.  Results were reviewed/authorized by Bethena Midget, RN, BSN.  He was notified by Bethena Midget, RN, BSN.         Prior Anticoagulation Instructions: INR 4.6 Skip today and tomorrow.  Then resume taking 1 tablet every day, except take 1 1/2 tablet Mondays, Wednesdays, and Fridays. Recheck in 2 weeks.  Current Anticoagulation Instructions: INR 1.4 Today take 2 pills and Thursday take 1.5 pills  then resume 1  pill everyday except 1.5 pills on Mondays, Wednesdays and Fridays Recheck in 2 weeks.

## 2010-04-07 NOTE — Cardiovascular Report (Signed)
Summary: Office Visit   Office Visit   Imported By: Roderic Ovens 03/30/2010 14:48:07  _____________________________________________________________________  External Attachment:    Type:   Image     Comment:   External Document

## 2010-04-15 ENCOUNTER — Ambulatory Visit (INDEPENDENT_AMBULATORY_CARE_PROVIDER_SITE_OTHER): Payer: Medicare Other | Admitting: *Deleted

## 2010-04-15 DIAGNOSIS — I4891 Unspecified atrial fibrillation: Secondary | ICD-10-CM

## 2010-04-15 LAB — POCT INR: INR: 3.2

## 2010-04-15 NOTE — Patient Instructions (Addendum)
INR 3.2 Today take only 1/2 tablet.  Then, continue taking 1 tablet (7.5mg ), except take 1 1/2 tablets on Mondays, Wednesdays, and Fridays. Recheck in 3 weeks.

## 2010-04-21 LAB — PROTIME-INR
INR: 1.99 — ABNORMAL HIGH (ref 0.00–1.49)
INR: 2.37 — ABNORMAL HIGH (ref 0.00–1.49)
INR: 2.55 — ABNORMAL HIGH (ref 0.00–1.49)
INR: 2.62 — ABNORMAL HIGH (ref 0.00–1.49)
Prothrombin Time: 25.7 seconds — ABNORMAL HIGH (ref 11.6–15.2)
Prothrombin Time: 29.4 seconds — ABNORMAL HIGH (ref 11.6–15.2)

## 2010-04-21 LAB — URINE CULTURE
Colony Count: NO GROWTH
Culture: NO GROWTH

## 2010-04-21 LAB — URINALYSIS, ROUTINE W REFLEX MICROSCOPIC
Bilirubin Urine: NEGATIVE
Hgb urine dipstick: NEGATIVE
Nitrite: NEGATIVE
Protein, ur: NEGATIVE mg/dL
Urobilinogen, UA: 0.2 mg/dL (ref 0.0–1.0)

## 2010-04-22 LAB — BLOOD GAS, ARTERIAL
Acid-base deficit: 3.9 mmol/L — ABNORMAL HIGH (ref 0.0–2.0)
Acid-base deficit: 4 mmol/L — ABNORMAL HIGH (ref 0.0–2.0)
Bicarbonate: 19.4 mEq/L — ABNORMAL LOW (ref 20.0–24.0)
Bicarbonate: 21 mEq/L (ref 20.0–24.0)
FIO2: 0.4 %
FIO2: 0.4 %
MECHVT: 550 mL
MECHVT: 550 mL
MECHVT: 550 mL
O2 Content: 4 L/min
O2 Saturation: 98.4 %
PEEP: 5 cmH2O
PEEP: 5 cmH2O
Patient temperature: 98.6
RATE: 16 resp/min
TCO2: 20.3 mmol/L (ref 0–100)
TCO2: 21.9 mmol/L (ref 0–100)
TCO2: 22.4 mmol/L (ref 0–100)
pCO2 arterial: 29.3 mmHg — ABNORMAL LOW (ref 35.0–45.0)
pCO2 arterial: 29.6 mmHg — ABNORMAL LOW (ref 35.0–45.0)
pCO2 arterial: 31.1 mmHg — ABNORMAL LOW (ref 35.0–45.0)
pCO2 arterial: 34.6 mmHg — ABNORMAL LOW (ref 35.0–45.0)
pH, Arterial: 7.414 (ref 7.350–7.450)
pH, Arterial: 7.437 (ref 7.350–7.450)
pH, Arterial: 7.438 (ref 7.350–7.450)
pH, Arterial: 7.443 (ref 7.350–7.450)
pO2, Arterial: 104 mmHg — ABNORMAL HIGH (ref 80.0–100.0)
pO2, Arterial: 108 mmHg — ABNORMAL HIGH (ref 80.0–100.0)
pO2, Arterial: 433 mmHg — ABNORMAL HIGH (ref 80.0–100.0)

## 2010-04-22 LAB — GLUCOSE, CAPILLARY
Glucose-Capillary: 108 mg/dL — ABNORMAL HIGH (ref 70–99)
Glucose-Capillary: 110 mg/dL — ABNORMAL HIGH (ref 70–99)
Glucose-Capillary: 111 mg/dL — ABNORMAL HIGH (ref 70–99)
Glucose-Capillary: 116 mg/dL — ABNORMAL HIGH (ref 70–99)
Glucose-Capillary: 120 mg/dL — ABNORMAL HIGH (ref 70–99)
Glucose-Capillary: 121 mg/dL — ABNORMAL HIGH (ref 70–99)
Glucose-Capillary: 123 mg/dL — ABNORMAL HIGH (ref 70–99)
Glucose-Capillary: 127 mg/dL — ABNORMAL HIGH (ref 70–99)
Glucose-Capillary: 127 mg/dL — ABNORMAL HIGH (ref 70–99)
Glucose-Capillary: 128 mg/dL — ABNORMAL HIGH (ref 70–99)
Glucose-Capillary: 129 mg/dL — ABNORMAL HIGH (ref 70–99)
Glucose-Capillary: 130 mg/dL — ABNORMAL HIGH (ref 70–99)
Glucose-Capillary: 135 mg/dL — ABNORMAL HIGH (ref 70–99)
Glucose-Capillary: 136 mg/dL — ABNORMAL HIGH (ref 70–99)
Glucose-Capillary: 137 mg/dL — ABNORMAL HIGH (ref 70–99)
Glucose-Capillary: 147 mg/dL — ABNORMAL HIGH (ref 70–99)
Glucose-Capillary: 156 mg/dL — ABNORMAL HIGH (ref 70–99)
Glucose-Capillary: 157 mg/dL — ABNORMAL HIGH (ref 70–99)
Glucose-Capillary: 89 mg/dL (ref 70–99)

## 2010-04-22 LAB — COMPREHENSIVE METABOLIC PANEL
AST: 27 U/L (ref 0–37)
Albumin: 2.5 g/dL — ABNORMAL LOW (ref 3.5–5.2)
Albumin: 2.9 g/dL — ABNORMAL LOW (ref 3.5–5.2)
Albumin: 3.9 g/dL (ref 3.5–5.2)
Alkaline Phosphatase: 103 U/L (ref 39–117)
BUN: 14 mg/dL (ref 6–23)
BUN: 23 mg/dL (ref 6–23)
Calcium: 9 mg/dL (ref 8.4–10.5)
Chloride: 106 mEq/L (ref 96–112)
Chloride: 110 mEq/L (ref 96–112)
Creatinine, Ser: 1 mg/dL (ref 0.4–1.5)
GFR calc Af Amer: >60 mL/min (ref >60)
GFR calc non Af Amer: >60 mL/min (ref >60)
Glucose, Bld: 154 mg/dL — ABNORMAL HIGH (ref 70–99)
Potassium: 4.5 mEq/L (ref 3.5–5.1)
Sodium: 138 mEq/L (ref 135–145)
Total Bilirubin: 0.5 mg/dL (ref 0.3–1.2)
Total Bilirubin: 0.7 mg/dL (ref 0.3–1.2)
Total Protein: 6.7 g/dL (ref 6.0–8.3)

## 2010-04-22 LAB — BASIC METABOLIC PANEL
BUN: 12 mg/dL (ref 6–23)
BUN: 18 mg/dL (ref 6–23)
BUN: 11 mg/dL (ref 6–23)
BUN: 13 mg/dL (ref 6–23)
BUN: 14 mg/dL (ref 6–23)
BUN: 21 mg/dL (ref 6–23)
CO2: 25 mEq/L (ref 19–32)
CO2: 27 mEq/L (ref 19–32)
CO2: 21 mEq/L (ref 19–32)
CO2: 22 mEq/L (ref 19–32)
CO2: 23 mEq/L (ref 19–32)
CO2: 27 mEq/L (ref 19–32)
CO2: 30 mEq/L (ref 19–32)
Calcium: 8.2 mg/dL — ABNORMAL LOW (ref 8.4–10.5)
Calcium: 8.3 mg/dL — ABNORMAL LOW (ref 8.4–10.5)
Calcium: 8.8 mg/dL (ref 8.4–10.5)
Calcium: 8.8 mg/dL (ref 8.4–10.5)
Chloride: 102 mEq/L (ref 96–112)
Chloride: 104 mEq/L (ref 96–112)
Chloride: 107 mEq/L (ref 96–112)
Chloride: 108 mEq/L (ref 96–112)
Chloride: 108 mEq/L (ref 96–112)
Creatinine, Ser: 0.92 mg/dL (ref 0.4–1.5)
Creatinine, Ser: 1.03 mg/dL (ref 0.4–1.5)
Creatinine, Ser: 0.87 mg/dL (ref 0.4–1.5)
Creatinine, Ser: 1.05 mg/dL (ref 0.4–1.5)
Creatinine, Ser: 1.06 mg/dL (ref 0.4–1.5)
GFR calc Af Amer: >60 mL/min (ref >60)
GFR calc Af Amer: >60 mL/min (ref >60)
GFR calc Af Amer: >60 mL/min (ref >60)
GFR calc Af Amer: >60 mL/min (ref >60)
GFR calc non Af Amer: >60 mL/min (ref >60)
GFR calc non Af Amer: >60 mL/min (ref >60)
GFR calc non Af Amer: >60 mL/min (ref >60)
GFR calc non Af Amer: >60 mL/min (ref >60)
Glucose, Bld: 106 mg/dL — ABNORMAL HIGH (ref 70–99)
Glucose, Bld: 116 mg/dL — ABNORMAL HIGH (ref 70–99)
Glucose, Bld: 118 mg/dL — ABNORMAL HIGH (ref 70–99)
Glucose, Bld: 128 mg/dL — ABNORMAL HIGH (ref 70–99)
Glucose, Bld: 140 mg/dL — ABNORMAL HIGH (ref 70–99)
Glucose, Bld: 148 mg/dL — ABNORMAL HIGH (ref 70–99)
Glucose, Bld: 165 mg/dL — ABNORMAL HIGH (ref 70–99)
Potassium: 4.1 mEq/L (ref 3.5–5.1)
Potassium: 4.4 mEq/L (ref 3.5–5.1)
Potassium: 4.3 mEq/L (ref 3.5–5.1)
Potassium: 4.4 mEq/L (ref 3.5–5.1)
Potassium: 4.4 mEq/L (ref 3.5–5.1)
Sodium: 138 mEq/L (ref 135–145)
Sodium: 138 mEq/L (ref 135–145)
Sodium: 139 mEq/L (ref 135–145)
Sodium: 142 mEq/L (ref 135–145)
Sodium: 143 mEq/L (ref 135–145)
Sodium: 143 mEq/L (ref 135–145)

## 2010-04-22 LAB — MAGNESIUM
Magnesium: 1.9 mg/dL (ref 1.5–2.5)
Magnesium: 2.1 mg/dL (ref 1.5–2.5)
Magnesium: 2.2 mg/dL (ref 1.5–2.5)

## 2010-04-22 LAB — URINE DRUGS OF ABUSE SCREEN W ALC, ROUTINE (REF LAB)
Benzodiazepines.: NEGATIVE
Ethyl Alcohol: <10 mg/dL (ref <10)
Methadone: NEGATIVE
Opiate Screen, Urine: NEGATIVE
Phencyclidine (PCP): NEGATIVE

## 2010-04-22 LAB — URINALYSIS, MICROSCOPIC ONLY
Bilirubin Urine: NEGATIVE
Glucose, UA: NEGATIVE mg/dL
Hgb urine dipstick: NEGATIVE
Ketones, ur: 15 mg/dL — AB
Nitrite: NEGATIVE
Specific Gravity, Urine: 1.024 (ref 1.005–1.030)
pH: 6.5 (ref 5.0–8.0)

## 2010-04-22 LAB — DIFFERENTIAL
Basophils Absolute: 0.1 10*3/uL (ref 0.0–0.1)
Basophils Relative: 1 % (ref 0–1)
Eosinophils Relative: 4 % (ref 0–5)
Lymphocytes Relative: 20 % (ref 12–46)
Lymphs Abs: 1.8 10*3/uL (ref 0.7–4.0)
Monocytes Absolute: 1 10*3/uL (ref 0.1–1.0)
Monocytes Relative: 10 % (ref 3–12)
Neutro Abs: 5.5 10*3/uL (ref 1.7–7.7)
Neutrophils Relative %: 61 % (ref 43–77)

## 2010-04-22 LAB — URINE CULTURE

## 2010-04-22 LAB — CULTURE, BLOOD (ROUTINE X 2): Culture: NO GROWTH

## 2010-04-22 LAB — MRSA PCR SCREENING: MRSA by PCR: NEGATIVE

## 2010-04-22 LAB — CK TOTAL AND CKMB (NOT AT ARMC)
CK, MB: 4.8 ng/mL — ABNORMAL HIGH (ref 0.3–4.0)
Relative Index: 2 (ref 0.0–2.5)
Total CK: 240 U/L — ABNORMAL HIGH (ref 7–232)

## 2010-04-22 LAB — PROTIME-INR
INR: 1.73 — ABNORMAL HIGH (ref 0.00–1.49)
INR: 1.79 — ABNORMAL HIGH (ref 0.00–1.49)
INR: 2.14 — ABNORMAL HIGH (ref 0.00–1.49)
INR: 2.14 — ABNORMAL HIGH (ref 0.00–1.49)
INR: 3.02 — ABNORMAL HIGH (ref 0.00–1.49)
INR: 1.12 (ref 0.00–1.49)
INR: 1.21 (ref 0.00–1.49)
INR: 1.59 — ABNORMAL HIGH (ref 0.00–1.49)
Prothrombin Time: 20 seconds — ABNORMAL HIGH (ref 11.6–15.2)
Prothrombin Time: 20.1 seconds — ABNORMAL HIGH (ref 11.6–15.2)
Prothrombin Time: 23.7 seconds — ABNORMAL HIGH (ref 11.6–15.2)
Prothrombin Time: 24.6 seconds — ABNORMAL HIGH (ref 11.6–15.2)
Prothrombin Time: 27.7 seconds — ABNORMAL HIGH (ref 11.6–15.2)
Prothrombin Time: 31.1 seconds — ABNORMAL HIGH (ref 11.6–15.2)
Prothrombin Time: 14.6 seconds (ref 11.6–15.2)
Prothrombin Time: 15.2 seconds (ref 11.6–15.2)
Prothrombin Time: 16.6 seconds — ABNORMAL HIGH (ref 11.6–15.2)

## 2010-04-22 LAB — LIPID PANEL
Cholesterol: 135 mg/dL (ref 0–200)
LDL Cholesterol: 79 mg/dL (ref 0–99)
Total CHOL/HDL Ratio: 4.2 RATIO
VLDL: 24 mg/dL (ref 0–40)

## 2010-04-22 LAB — CBC
HCT: 26.2 % — ABNORMAL LOW (ref 39.0–52.0)
HCT: 29.2 % — ABNORMAL LOW (ref 39.0–52.0)
HCT: 30.7 % — ABNORMAL LOW (ref 39.0–52.0)
Hemoglobin: 8.7 g/dL — ABNORMAL LOW (ref 13.0–17.0)
Hemoglobin: 9.3 g/dL — ABNORMAL LOW (ref 13.0–17.0)
MCHC: 33.8 g/dL (ref 30.0–36.0)
MCHC: 33.9 g/dL (ref 30.0–36.0)
MCHC: 34.1 g/dL (ref 30.0–36.0)
MCHC: 34.2 g/dL (ref 30.0–36.0)
MCHC: 34.3 g/dL (ref 30.0–36.0)
MCHC: 34.3 g/dL (ref 30.0–36.0)
MCHC: 34.7 g/dL (ref 30.0–36.0)
MCV: 94.1 fL (ref 78.0–100.0)
MCV: 96.3 fL (ref 78.0–100.0)
MCV: 96.8 fL (ref 78.0–100.0)
MCV: 97.3 fL (ref 78.0–100.0)
Platelets: 124 10*3/uL — ABNORMAL LOW (ref 150–400)
Platelets: 148 10*3/uL — ABNORMAL LOW (ref 150–400)
Platelets: 168 10*3/uL (ref 150–400)
Platelets: 316 10*3/uL (ref 150–400)
RBC: 3.48 MIL/uL — ABNORMAL LOW (ref 4.22–5.81)
RBC: 2.78 MIL/uL — ABNORMAL LOW (ref 4.22–5.81)
RBC: 4.28 MIL/uL (ref 4.22–5.81)
RDW: 14.2 % (ref 11.5–15.5)
RDW: 13.4 % (ref 11.5–15.5)
RDW: 13.8 % (ref 11.5–15.5)
RDW: 13.8 % (ref 11.5–15.5)
RDW: 14.4 % (ref 11.5–15.5)
WBC: 7.8 10*3/uL (ref 4.0–10.5)
WBC: 9.1 10*3/uL (ref 4.0–10.5)

## 2010-04-22 LAB — PREPARE FRESH FROZEN PLASMA

## 2010-04-22 LAB — HEPATIC FUNCTION PANEL
Albumin: 2.7 g/dL — ABNORMAL LOW (ref 3.5–5.2)
Indirect Bilirubin: 0.5 mg/dL (ref 0.3–0.9)
Total Bilirubin: 0.7 mg/dL (ref 0.3–1.2)

## 2010-04-22 LAB — URINALYSIS, ROUTINE W REFLEX MICROSCOPIC
Glucose, UA: NEGATIVE mg/dL
Ketones, ur: NEGATIVE mg/dL
Protein, ur: NEGATIVE mg/dL

## 2010-04-22 LAB — POCT I-STAT, CHEM 8
Calcium, Ion: 1.15 mmol/L (ref 1.12–1.32)
Chloride: 101 mEq/L (ref 96–112)
Glucose, Bld: 147 mg/dL — ABNORMAL HIGH (ref 70–99)
HCT: 43 % (ref 39.0–52.0)
Hemoglobin: 14.6 g/dL (ref 13.0–17.0)
Potassium: 2.9 mEq/L — ABNORMAL LOW (ref 3.5–5.1)

## 2010-04-22 LAB — POCT I-STAT 4, (NA,K, GLUC, HGB,HCT)
Glucose, Bld: 126 mg/dL — ABNORMAL HIGH (ref 70–99)
HCT: 33 % — ABNORMAL LOW (ref 39.0–52.0)

## 2010-04-22 LAB — BRAIN NATRIURETIC PEPTIDE
Pro B Natriuretic peptide (BNP): 418 pg/mL — ABNORMAL HIGH (ref 0.0–100.0)
Pro B Natriuretic peptide (BNP): 433 pg/mL — ABNORMAL HIGH (ref 0.0–100.0)

## 2010-04-22 LAB — PHOSPHORUS
Phosphorus: 2.2 mg/dL — ABNORMAL LOW (ref 2.3–4.6)
Phosphorus: 4.2 mg/dL (ref 2.3–4.6)

## 2010-04-22 LAB — HEMOGLOBIN A1C: Mean Plasma Glucose: 120 mg/dL

## 2010-04-22 LAB — DIGOXIN LEVEL: Digoxin Level: <0.2 ng/mL — ABNORMAL LOW (ref 0.8–2.0)

## 2010-04-22 LAB — CARDIAC PANEL(CRET KIN+CKTOT+MB+TROPI)
Relative Index: 1.8 (ref 0.0–2.5)
Troponin I: 0.04 ng/mL (ref 0.00–0.06)

## 2010-05-06 ENCOUNTER — Ambulatory Visit (INDEPENDENT_AMBULATORY_CARE_PROVIDER_SITE_OTHER): Payer: Medicare Other | Admitting: *Deleted

## 2010-05-06 DIAGNOSIS — I4891 Unspecified atrial fibrillation: Secondary | ICD-10-CM

## 2010-05-20 ENCOUNTER — Ambulatory Visit (INDEPENDENT_AMBULATORY_CARE_PROVIDER_SITE_OTHER): Payer: Medicare Other | Admitting: *Deleted

## 2010-05-20 DIAGNOSIS — I4891 Unspecified atrial fibrillation: Secondary | ICD-10-CM

## 2010-05-26 ENCOUNTER — Encounter: Payer: Self-pay | Admitting: Physician Assistant

## 2010-05-28 ENCOUNTER — Encounter: Payer: Self-pay | Admitting: Physician Assistant

## 2010-05-28 ENCOUNTER — Ambulatory Visit (INDEPENDENT_AMBULATORY_CARE_PROVIDER_SITE_OTHER): Payer: Medicare Other | Admitting: Physician Assistant

## 2010-05-28 ENCOUNTER — Ambulatory Visit (INDEPENDENT_AMBULATORY_CARE_PROVIDER_SITE_OTHER)
Admission: RE | Admit: 2010-05-28 | Discharge: 2010-05-28 | Disposition: A | Discharge status: Home / Self Care | Payer: Medicare Other | Source: Ambulatory Visit | Attending: Physician Assistant | Admitting: Physician Assistant

## 2010-05-28 VITALS — BP 110/80 | HR 66 | Resp 18 | Ht 69.0 in | Wt 267.8 lb

## 2010-05-28 DIAGNOSIS — R079 Chest pain, unspecified: Secondary | ICD-10-CM

## 2010-05-28 DIAGNOSIS — I5022 Chronic systolic (congestive) heart failure: Secondary | ICD-10-CM

## 2010-05-28 DIAGNOSIS — I428 Other cardiomyopathies: Secondary | ICD-10-CM | POA: Insufficient documentation

## 2010-05-28 DIAGNOSIS — I509 Heart failure, unspecified: Secondary | ICD-10-CM

## 2010-05-28 DIAGNOSIS — R0602 Shortness of breath: Secondary | ICD-10-CM

## 2010-05-28 LAB — BRAIN NATRIURETIC PEPTIDE: Pro B Natriuretic peptide (BNP): 133 pg/mL — ABNORMAL HIGH (ref 0.0–100.0)

## 2010-05-28 LAB — BASIC METABOLIC PANEL
BUN: 18 mg/dL (ref 6–23)
Chloride: 103 mEq/L (ref 96–112)
Creatinine, Ser: 1.1 mg/dL (ref 0.4–1.5)
Glucose, Bld: 85 mg/dL (ref 70–99)

## 2010-05-28 NOTE — Patient Instructions (Addendum)
Your physician recommends that you schedule a follow-up appointment in: 3 MONTHS WITH DR. HOCHREIN AS PER SCOTT WEAVER, PA-C  Your physician recommends that you return for lab work in: TODAY BMET, BNP 786.50, 428.22  A chest x-ray takes a picture of the organs and structures inside the chest, including the heart, lungs, and blood vessels 786.50 CHEST PAIN. This test can show several things, including, whether the heart is enlarges; whether fluid is building up in the lungs; and whether pacemaker / defibrillator leads are still in place.   Your physician recommends that you schedule a follow-up appointment in: JUNE WITH [facility]ICE [facility] AS PER PAULA O'DELL.   Your physician has requested that you have a REST lexiscan Myoview 786.50 For further information please visit [email or web address] Please follow instruction sheet, as given.

## 2010-05-28 NOTE — Assessment & Plan Note (Signed)
Volume appears stable.  Check chest x-ray and lab work as noted.  If his BNP is significantly elevated, increase his diuresis.  Otherwise, follow up with Dr. Antoine Poche in 3 months.

## 2010-05-28 NOTE — Assessment & Plan Note (Signed)
Symptoms are atypical.  It would be unlikely that he has developed significant coronary disease since his heart catheterization in 2008.  However, he does have some shortness of breath associated with his symptoms.  He would not be able to walk on the treadmill given his history of CVA.  I will set him up for a Lexiscan Myoview.  He does not look volume overloaded to me.  However, he has had some weight gain over the last several months.  I will also get a chest x-ray, basic metabolic panel and BNP.

## 2010-05-28 NOTE — Progress Notes (Signed)
History of Present Illness: Primary Cardiologist:  Dr. [name]  Raymond [name]. is a 50 y.o. 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  He presents today for chest pain.  He has actually had this for quite some time.  He says that he started having some time before his stroke.  It comes and goes.  It seems to be associated more with stress.  He denies exertional chest pain.  He does note associated shortness of breath.  He denies syncope.  He sleeps at an incline chronically without significant change.  He has gained several pounds over the last several months.  He denies any significant edema.  He denies syncope.  Past Medical History  Diagnosis Date  . Systolic CHF, chronic   . Non-ischemic cardiomyopathy     echo 11/10: EF 15%, mild MR, mod LAE, mild dec. RVSF, PASP;   cath 10/08: normal cors  . Chronic atrial fibrillation     ON COUMADIN  . Hypertension   . Hypothyroidism     s/p RAI therapy  . Obesity   . Non-compliance   . Dyslexia   . 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 RBBB (right bundle branch block)   . Palpitations     W/WIDE COMPLEX TACHYCARDIA IN THE PAST, REFUSED REPEATED OFFERS OF AN ICD  . 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 acetaminophen (TYLENOL) 325 MG tablet Take 650 mg by mouth every 4 (four) hours as needed.        . ALPRAZolam (XANAX) 0.25 MG tablet Take 0.25 mg by mouth at bedtime as needed.        Marland Kitchen amiodarone (PACERONE) 200 MG tablet Take 200 mg by mouth daily.        . digoxin (LANOXIN) 0.125 MG tablet Take 125 mcg by mouth daily.        . enalapril (VASOTEC) 5 MG tablet Take 5 mg by mouth 2 (two) times daily.        . furosemide (LASIX)  80 MG tablet Take 80 mg by mouth as needed.       Marland Kitchen levothyroxine (SYNTHROID, LEVOTHROID) 25 MCG tablet Take 25 mcg by mouth daily.        . metoprolol (LOPRESSOR) 50 MG tablet Take 50 mg by mouth 2 (two) times daily. TAKE 1 AND 1/2 TABLETS TWICE DAILY       . pantoprazole (PROTONIX) 40 MG tablet Take 40 mg by mouth as needed.       . potassium chloride SA (K-DUR,KLOR-CON) 20 MEQ tablet Take 20 mEq by mouth daily.        Marland Kitchen spironolactone (ALDACTONE) 25 MG tablet Take 25 mg by mouth daily.        Marland Kitchen warfarin (COUMADIN) 7.5 MG tablet Take by mouth as directed.        Marland Kitchen DISCONTD: DULoxetine (CYMBALTA) 30 MG capsule Take 30 mg by mouth daily.        Marland Kitchen DISCONTD: levothyroxine (SYNTHROID, LEVOTHROID) 75 MCG tablet Take 75 mcg by mouth daily.        Marland Kitchen DISCONTD: acyclovir (ZOVIRAX) 400 MG tablet Take 400 mg by mouth 2 (two) times daily.  No Known Allergies   History  Substance Use Topics  . Smoking status: Unknown If Ever Smoked  . Smokeless tobacco: Not on file  . Alcohol Use:     ROS:  See history of present illness.  He denies fevers, chills, cough, pleuritic chest pain, nausea, vomiting, diarrhea, melena, hematochezia, dysphagia, odynophagia.  All other systems reviewed and negative.  Vital Signs: BP 110/80  Pulse 66  Resp 18  Ht 5\' 9"  (1.753 m)  Wt 267 lb 12.8 oz (121.473 kg)  BMI 39.55 kg/m2  PHYSICAL EXAM: Well nourished, well developed, in no acute distress HEENT: normal Neck: no JVD Endocrine: No thyromegaly Cardiac:  normal S1, S2; RRR; no murmur Lungs:  clear to auscultation bilaterally, no wheezing, rhonchi or rales Abd: soft, nontender, no hepatomegaly Ext: Trace bilateral edema Skin: warm and dry Neuro:  CNs 2-12 intact, no focal abnormalities noted Psych: Normal affect  EKG:  Sinus rhythm, heart rate 66, ventricular paced  ASSESSMENT AND PLAN:

## 2010-06-02 NOTE — Assessment & Plan Note (Signed)
[facility]                          CHRONIC HEART FAILURE NOTE   Raymond [name], Raymond [name]                          MRN:          [id number]  DATE:08/09/2007                            DOB:          10/12/1960    PRIMARY CARDIOLOGIST:  [name], MD, [facility]   PRIMARY CARE PHYSICIAN:  Talmadge Coventry, MD   ELECTROPHYSIOLOGY:  Doylene Canning. [name], MD   Windy Fast returns today for further followup of his congestive heart  failure which is secondary to nonischemic cardiomyopathy with EF  currently 10-20%, previously evaluated by Dr. Lewayne Bunting with  recommendations for ICD prophylactic implantation secondary to high risk  for sudden cardiac death.  With the patient still considering whether or  not he wants to proceed with ICD, we have had numerous conversations  regarding his heart disease and high risk for SCD.  Raymond [name] states he has  been feeling okay.  He complains of occasional dizziness.  He has had  two episodes of palpitations that occurred with the dizziness, but  states they resolved quickly, continues to have dyspnea with minimal  exertion and orthopnea for 2 months.  He states he has been sleeping on  three pillows for 2 months, but denies any orthopnea associated with  this.  He states he does rest better on 3 pillows, continues to walk on  the treadmill at the gym and does some free weight, still receiving  unemployment pending a disability application.   REVIEW OF SYSTEMS:  As stated above, otherwise negative.   Current medications include;  1. Synthroid 137 mcg daily.  2. Aspirin 81.  3. Lasix 80 in the morning and 40 in the evening.  4. Carvedilol 3.125 b.i.d.  5. Xanax p.r.n.  6. Digoxin 0.25 daily.  7. Atacand 8 mg daily.  8. KCl 20 mEq b.i.d.   P.r.n. medications include an additional Lasix 40 mg.   PHYSICAL EXAMINATION:  Weight 231 pounds, weight is down 5 pounds, blood  pressure 123/80 with a heart rate of 96.  Raymond [name] states he  has not taken  any of his medications yet this morning.  He has no signs of jugular  vein distention at 45-degree angle.  He is not in any acute distress.  Lungs are clear to auscultation throughout.  Cardiovascular exam reveals  S1 and S2, regular rate and rhythm.  Abdomen is soft, nontender,  positive bowel sounds, and obese.  Lower extremities are without signs  of clubbing, cyanosis, or edema.  Neurologically, alert and oriented x3.   IMPRESSION:  Congestive heart failure secondary to nonischemic  cardiomyopathy with EF of 10-20% with the patient refusing ICD  consideration at this time.  We will continue current medications and  titrate his carvedilol up to 6.25 mg b.i.d. and see him back in 1 month.  He will need lab work today.      Dorian Pod, ACNP  Electronically Signed      Bevelyn Buckles. Bensimhon, MD  Electronically Signed   MB/MedQ  DD: 08/09/2007  DT: 08/10/2007  Job #: [id number]   cc:   Annmarie Mazzocchi,  M.D. 

## 2010-06-02 NOTE — Assessment & Plan Note (Signed)
[facility]                          CHRONIC HEART FAILURE NOTE   Raymond, [name]                          MRN:          [id number]  DATE:10/17/2006                            DOB:          [birth year]    [name] returns today for followup of his congestive heart failure.   He is relatively new to the heart failure clinic.  I saw him as a new  patient back earlier this month.  [name] had previously been followed  in the heart failure clinic but had not been here in over a year.  He  apparently was seen by his primary care physician, Dr. Talmadge Coventry, and referred here for a cough.  When I saw [name]  initially, I started him on furosemide 20 mg daily.  He had stopped all  his previous medications including his ACE inhibitor (cough) and BB.  I  had started him back on his bisoprolol 5 mg daily and checked blood work  on him.  Lab work results:  Potassium 4.2, BUN creatinine 12 and 1.3.  TSH 1.72.  BNP 560.  That was on September 27, 2006.   [name] took the Lasix and that seemed to help with the cough initially  but then it continued.  He tried the bisoprolol for 2 days but states it  aggravated his cough and made him feel like he could not take a deep  breath, so he stopped it.  [name] is a very interesting young man in  that he complains of a chronic cough but refused previous workup.  I  finally convinced him to have the blood work done and a chest x-ray  done.  The chest x-ray done at the same time the blood work was done  showed cardiomegaly and vascular congestion without interstitial edema  and bronchitic changes.  [name] states the cough has continued.  He  cannot tell any difference now even with the addition of the Lasix.  He  describes what sounds like class II to early III symptoms [employer] Heart  Association with shortness of breath with minimal exertion.  However, he  states he works 2 jobs.  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  He also goes to the gym 5 days a week.  He  lifts weights but states he cannot do the stair step or treadmill  because it makes him too short of breath.  He denies any orthopnea or  PND, although he states he does wheeze occasionally when he is laying in  bed at night.  He denies ever using any tobacco products but complains  the ongoing cough is productive at times.  He states it is usually most  aggravating in the morning and  produces a thick white sputum.  He  denies any fevers, chills associated with it.  He complains of ongoing  fatigue, however, he continues to carry out the  daily routine as stated  above.  He also complains of occasional night sweats.   PAST MEDICAL HISTORY:  1. Congestive heart failure secondary to nonischemic cardiomyopathy      with an EF of 20-25% by echocardiogram in 2006, most likely      secondary to thyroid toxic cardiomyopathy with a history of Graves      disease and noncompliance with medications.  2. History of hyperthyroidism (Graves disease) which was treated, now      with a hypothyroidism.  3. History of bifascicular block.  4. History of hypertension.  5. History of paroxysmal atrial fibrillation.  6. History of intolerance to ACE inhibitor secondary to cough.  7. Intolerance to beta-blocker secondary to increased respiratory      compromise/wheeze.  8. History of medical noncompliance.  9. Illiteracy/dyslexia.  10.History of ventricular tachycardia as noted by [facility] previous office note from 2003.   REVIEW OF SYSTEMS:  As stated above.   CURRENT MEDICATIONS:  1. Synthroid 137 mcg daily.  2. ProAir daily.  3. Aspirin which I started last visit 325 mg daily.  4. Furosemide 20 mg daily.  5. KCl 20 mEq daily.  6. Amoxicillin, the patient states he is on this prophylactically for      a  dental procedure he is getting ready to have done.   PHYSICAL EXAMINATION:  VITAL SIGNS:  Weight 226 pounds, blood pressure  113/86 with a heart rate of 104 apical pulse 92.  GENERAL:  [name] is in no acute distress.  He is a pleasant middle-  aged Caucasian gentleman with disruptive cough that is nonproductive at  this time.  NECK:  He has no signs of jugular venous distention at a 45 degree  angle.  LUNGS:  Essentially clear.  CARDIOVASCULAR:  Reveals an S1 and S2, regular rate and rhythm.  ABDOMEN:  Soft, nontender.  Positive bowel sounds.  EXTREMITIES:  Lower extremities without clubbing, cyanosis, or edema.  NEUROLOGIC:  The patient is alert and oriented x3.   IMPRESSION:  Nonischemic cardiomyopathy without signs of volume overload  today with an ejection fraction of 20-25% by echocardiogram in December  2006, with the patient having a history of noncompliance with  medications and followup appointments.   His primary complaint at this time is an ongoing cough that initially  seemed to improve with diuretic therapy but now no change.  Chest x-ray  did not reveal any worrisome abnormalities.  The patient had a mildly  elevated BNP when I checked lab work on him earlier this month.   PLAN:  1. I am going to refer the patient to pulmonary for further evaluation      as he states he does wheeze at times and apparently was intolerant      of the bisoprolol I started him on and complains of a previous      intolerance to ACE inhibitors secondary to a cough.  I am not sure      what to think of 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  2. I  am going to recheck a BNP level on him today and refer him to      pulmonology.  3. I also would like to  repeat his echocardiogram.  Depending on what      pulmonary evaluation shows, I am going to arrange for him to have a      cardiopulmonary exercise test for further      evaluation of his heart failure after I repeat the echocardiogram.      Also, will most likely need to be referred for EP evaluation.      Dorian Pod, ACNP  Electronically Signed      [name], MD, [facility]  Electronically Signed   MB/MedQ  DD: 10/17/2006  DT: 10/17/2006  Job #: [id number]   cc:   Talmadge Coventry, M.D.

## 2010-06-02 NOTE — Assessment & Plan Note (Signed)
[facility]                          CHRONIC HEART FAILURE NOTE   Raymond [name]                          MRN:          [id number]  DATE:11/16/2006                            DOB:          Feb 22, 1960    PRIMARY CARDIOLOGIST:  [name], M.D.   PRIMARY CARE:  Talmadge Coventry, M.D.   Raymond [name] returns today for followup status post recent hospitalization.  He has a known history of congestive heart failure secondary to  nonischemic cardiomyopathy.  I have been following him closely over the  last few months secondary to increased cough.  ACE inhibitor was  discontinued secondary to his cough.  I initiated beta blocker therapy  on the patient.  The patient states he could not take that, it made him  feel awful.  I referred the patient to Dr. Antoine Poche for further  followup.  Atacand was initiated.  The patient continued to complain of  cough.  He was concerned secondary to financial issues.  Ultimately, the  patient was agreeable to being admitted to [facility] for further evaluation  of his cough.  He underwent a right and left heart catheterization for  further evaluation.  Cardiac catheterization did not show any coronary  artery disease.  EF was calculated at 10-20% with inferior posterior  akinesis, global hypokinesis, mild MR, left atrial enlargement.  The  patient responded well to IV diuretic therapy.  He was noted to have a  17-beat run of wide-complex tachycardia.  The patient was asymptomatic,  no further episodes noted.  Electrolytes were within normal limits.  The  patient was discharged home to follow up here in the heart failure  clinic.  He has remained stable at home and tolerant of his medications,  continues Atacand at 4 mg daily and his Lasix at 40 mg b.i.d.  The  patient states he is feeling much better.  He still coughs with exertion  but no coughing episodes like he was having prior to his  hospitalization.  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  He has since returned to the gym, is  lifting weights, has not started back on any of his cardiac exercises.  I do not have Raymond [name] chart available at this time; it is over at the  pulmonary office.   PAST MEDICAL HISTORY:  1. Congestive heart failure secondary to nonischemic cardiomyopathy      with an EF currently 10-20% by cardiac catheterization during      recent hospitalization.  2. One episode of wide-complex tachycardia during recent      hospitalization.  3. History of ACE intolerance.  4. History of BETA BLOCKER intolerance.  5. History of hypertension.  6. History of PAF.  7. Previous history of hyperthyroidism which was treated with      resultant hypothyroidism.  8. History of medical noncompliance.  9. Illiteracy and dyslexia.   REVIEW OF SYSTEMS:  As stated above.   CURRENT MEDICATIONS:  1. Synthroid 0.137 mg daily.  2. Aspirin 81 mg daily.  3. Lasix 40 mg b.i.d.  4. Potassium chloride 20 mEq b.i.d.  5. Atacand 4 mg daily.   PHYSICAL EXAMINATION:  Weight 227 pounds.  The patient states his  discharge weight from [facility] on October 23 was 217; however, today  the patient is wearing his work clothes.  Also states he had his cell  phone and all of his keys in his pocket too.  Blood pressure 107/72 with  a heart rate of 96.  Raymond [name] is in no acute distress.  No jugular  venous distention noted.  LUNGS:  Clear to auscultation bilaterally.  CARDIOVASCULAR:  Reveals an S1 and S2, regular rate and rhythm.  ABDOMEN:  Soft, nontender.  LOWER EXTREMITIES:  Without edema.  NEUROLOGIC:  Alert and oriented x3.  SKIN:  Warm and dry.   IMPRESSION:  Congestive heart failure secondary to nonischemic  cardiomyopathy without signs of volume overload at this time.  Continue  current medications.  For financial reasons I would like to try ACE  inhibitor again on Raymond [name] as he will not be able to  afford Atacand.  I  have given him enough samples to last him another month of the Atacand,  and then when he follows up after reevaluation, we will determine  whether or not to possibly consider switching him to an ACE inhibitor  generic form.  He is agreeable with this plan.  Otherwise, will continue  current medications.      Dorian Pod, ACNP  Electronically Signed      Bevelyn Buckles. Bensimhon, MD  Electronically Signed   MB/MedQ  DD: 11/16/2006  DT: 11/17/2006  Job #: [id number]   cc:   Talmadge Coventry, M.D.

## 2010-06-02 NOTE — Assessment & Plan Note (Signed)
[facility]                          CHRONIC HEART FAILURE NOTE   Raymond [name], Raymond [name]                          MRN:          [id number]  DATE:07/05/2007                            DOB:          01-28-1960    PRIMARY CARDIOLOGIST:  [name], MD, [facility]   PRIMARY CARE PHYSICIAN:  Talmadge Coventry, MD   ELECTROPHYSIOLOGY:  Doylene Canning. [name], MD   Raymond [name] returns today for followup of his congestive heart failure, which  is secondary to nonischemic cardiomyopathy.  I last saw him in May 2009,  at which time, I spoke at length with Raymond [name] concerning his high risk  for sudden cardiac death.  I made adjustments in his medication at that  time as Raymond [name] is still hesitant to proceed with ICD implant, he states  because of financial concerns.  Raymond [name] was having more symptoms in  regards to his heart failure at that visit.  He returns today  complaining of worsening symptoms.  His weight is up 7 pounds.  He  states he feels tired and more fatigued easily.  He has been coughing  some.  He has been sleeping on 3 pillows for the last 2 months, and he  has had more episodes of lightheadedness.  He states he saw Dr. Talmadge Coventry.  She started him on Xanax because of his increased stress and  checked blood work, which I have a copy of at this time.  TSH was good  at 1.889.  Liver function normal, potassium 4.6, and BUN and creatinine  15 and 1.14.  H&H were normal.  Copy of his lab work will be placed in  his chart.  Raymond [name] states he is still not working as he was laid off  from his job.  He has been trying to find a job, but says now he is  worried about returning to work because he becomes fatigued so easily.  He is also considering applying for disability.   PAST MEDICAL HISTORY:  1. Congestive heart failure secondary to nonischemic cardiomyopathy      with an EF of 10-20%, previous evaluation by Dr. Lewayne Bunting with      recommendations for ICD  prophylactic implantation secondary to high      risk for sudden cardiac death with the patient declining ICD      implant at this time.  2. One episode of wide-complex tachycardia during previous      hospitalization.  3. History of ACE inhibitor intolerance felt to be contributing factor      to his cough.  4. History of beta-blocker intolerance causing respiratory compromise      for the patient.  5. History of hypertension.  6. Paroxysmal atrial fibrillation.  7. Hypothyroidism.  8. History of medical noncompliance.  9. Literacy and dyslexia.   REVIEW OF SYSTEMS:  As stated above, otherwise negative.   CURRENT MEDICATIONS:  1. Synthroid 0.137 mg daily.  2. Aspirin 81 daily.  3. Lasix 40 mg b.i.d.  4. Kay Ciel 20 mEq b.i.d.  5. Atacand 8 mg daily.  6. Digoxin  0.25 mg daily.  7. Xanax, per Dr. Talmadge Coventry.   PHYSICAL EXAMINATION:  VITAL SIGNS:  Weight 236 pounds, he is up 7  pounds in the last month, blood pressure 106/74 with heart rate of 101.  GENERAL:  Raymond [name] is in no acute distress at this time.  NECK:  He has JVD around 10 cm at a 45-degree angle.  LUNGS:  Clear to auscultation.  CARDIOVASCULAR:  S1 and S2, regular rate and rhythm, and mildly  tachycardic.  ABDOMEN:  Soft and nontender.  Mild distention.  Positive bowel sounds.  EXTREMITIES:  Lower extremities without clubbing or cyanosis.  He has a  trace edema.  NEUROLOGIC:  Alert and oriented x3.   IMPRESSION:  Congestive heart failure secondary to nonischemic  cardiomyopathy with late class II symptoms with signs of volume overload  at this time in the setting of an ejection fraction of 10-20% with the  patient declining implantable cardioverter-defibrillator implant at this  time.  I have had a long talk with Raymond [name] today about his increased risk  for sudden cardiac death again and his increasing symptoms related to  his heart failure.  We are going to attempt a low-dose beta-blocker  again, Coreg  3.125 mg b.i.d., and continue Atacand at current dose.  He  is going to need to increase his Lasix to 80 mg in the morning and  continue 40 in the evening and take extra of 40 of potassium today.  He  knows to call me if he has any problems with the beta-blocker, and I  will see him back in 4 weeks.  Raymond [name] has also asked about transplant  process.  We have talked briefly about this today.  The next step for  him would be to follow up with Dr. [name] first and proceed with a  cardiopulmonary exercise test.      Raymond [name], ACNP  Electronically Signed      Raymond [name]. Bensimhon, MD  Electronically Signed   MB/MedQ  DD: 07/05/2007  DT: 07/05/2007  Job #: [id number]

## 2010-06-02 NOTE — Discharge Summary (Signed)
NAMEABDULWAHAB, Raymond [name]                 ACCOUNT NO.:  [id number]   MEDICAL RECORD NO.:  [id number]          PATIENT TYPE:  INP   LOCATION:  4703                         FACILITY:  [facility]   PHYSICIAN:  [name], MD, FACCDATE OF BIRTH:  September 04, 1960   DATE OF ADMISSION:  11/08/2006  DATE OF DISCHARGE:  11/10/2006                         DISCHARGE SUMMARY - REFERRING   DISCHARGE DIAGNOSES:  1. 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  2. ACE intolerance.  3. History as noted below.   PROCEDURES PERFORMED:  Cardiac catheterization by Dr. Juanda Chance, November 08, 2006.   BRIEF HISTORY:  [name] is a 50 year old white male who is being  admitted to the hospital for right and left heart catheterization  secondary to his nonischemic cardiomyopathy.  This was initially  presented to him for further evaluation of his increasing cough, dyspnea  on exertion and class II heart failure symptoms.  He eventually agreed  to cardiac catheterization, thus his admission.  The patient also has a  history of hypertension, PAF, hyperthyroidism which was treated with  resultant hypothyroidism.  He also has a history of medical  noncompliance secondary to insurance restrictions and cost.  He has been  advised to have pulmonary function testing and pulmonary evaluation.  However, this has not been done secondary to his insurance restrictions.   LABORATORY:  Admission H&H was 14.1 and 42.2, normal indices, platelets  343, WBCs 9.5.  Admission PTT 34, PT 13.3, sodium 137, potassium 44, BUN  20, creatinine 1.3, glucose 100.  On November 09, 2006, sodium was 138,  potassium 3.7, BUN 19, creatinine 1.08, glucose 119, magnesium 2.3, CK-  MBs relative indexes were within normal limits x3.  Troponins were 0.06,  0.09 and 0.06.  BNP on admission was 400, TSH 0.656.   DIAGNOSTICS:  1. Chest x-ray on November 08, 2006, showed persistent cardiac      enlargement with vascular congestion  and mild interstitial edema.  2. EKGs show normal sinus rhythm, sinus tachycardia, left axis      deviation, right bundle branch block, occasional PVC.   HOSPITAL COURSE:  [name] was admitted to [facility] and underwent  cardiac catheterization on November 08, 2006 by Dr. Juanda Chance which did not  show any coronary artery disease.  Dr. Juanda Chance also performed right heart  cath echocardiogram and showed an ejection fraction of 10-20% with  inferior posterior akinesis, global hypokinesis, mild MR, left atrial  enlargement.  On November 09, 2006, the patient was doing well.  Dr.  Antoine Poche had him remain overnight for additional diuresis.  Nursing was  notified by the monitor tech of a 17-18 beat run of wide-complex  tachycardia.  The patient was asymptomatic.  Theodore Demark PA was  notified the following morning.  Potassium and magnesium were within  normal limits, although the potassium was slightly on the lower side,  thus he was supplemented.  After review by Dr. Antoine Poche, the patient was  felt to be able to be discharged home with continuing outpatient  management of his heart failure.  Dr.  Hochrein felt at this point he was  not a candidate yet for ICD/BIV placement.   DISPOSITION:  The patient is discharged home.   DISCHARGE MEDICATIONS:  1. His Lasix was increased to 40 mg b.i.d.  2. Klor-Con 20 mEq daily.  3. He was asked to continue his Synthroid 0.137 mg daily.  4. Aspirin 325 mg daily.  5. Atacand 4 mg daily.   DISCHARGE INSTRUCTIONS:  1. He was asked to bring all medicines and all weights to all      appointments.  2. Prior to discharge, he had reminder education in regards to the      CHF.  3. Wound care and activities post-catheterization are per supplemental      discharge sheet.  4. He will follow up in the heart failure clinic and keep his already      scheduled appointment on November 16, 2006 2:00 p.m.  5. Consideration should be given to check an MB net given that  we have      increased his Lasix and potassium.   Discharge time 30 minutes.      Joellyn Rued, PA-C      [name], MD, [facility]  Electronically Signed    EW/MEDQ  D:  11/10/2006  T:  11/10/2006  Job:  [id number]   cc:   [name], MD, [facility]  Talmadge Coventry, M.D.

## 2010-06-02 NOTE — Assessment & Plan Note (Signed)
[facility]                            CARDIOLOGY OFFICE NOTE   Raymond [name], Raymond [name]                          MRN:          [id number]  DATE:03/08/2008                            DOB:          Aug 04, 1960    PRIMARY CARE PHYSICIAN:  None.   REASON FOR PRESENTATION:  Evaluate the patient with nonischemic  cardiomyopathy and presyncope.   HISTORY OF PRESENT ILLNESS:  The patient is a pleasant 50 year old  gentleman with history of nonischemic cardiomyopathy.  Since I last saw  him, he has had continued stress over finances.  He is trying to get  disability.  He has had occasional palpitations.  He is described these  before.  I have tried to get him to wear a Holter monitor.  However, it  was going to cost 400 dollars to his insurance and he could not afford  this.  We talked about getting a defibrillator.  He steadfastly refused  this until he can figure out his insurance situation.  The co-payers  with this current insurance would be 4000 dollars.  He has not had any  presyncope or syncope, though he is at times felt his heart racing.  He  has not had any chest pressure, neck, or arm discomfort.  He will get  short of breath with moderate activity (class II) does not have any  resting shortness of breath, does not describe any PND or orthopnea.  He  has not been having any chest discomfort, neck, or arm discomfort.  His  dose adjusting his diuretic and is on a [facility] where  he gets called when he weighs himself daily.  He takes 40 mg twice a day  of Lasix and then supplements with an extra 40 mg as needed.  He is  watching his salt and fluid.   PAST MEDICAL HISTORY:  1. 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  2. Palpitations with wide complex tachycardia in the past (currently      refusing ICD).  3. Previous history of ACE inhibitor intolerance.  4. Obesity.  5. Dyslexia.  6. Bifascicular block.  7. Hypertension.  8. History of paroxysmal atrial fibrillation.  9. Hypothyroidism (status post radiation ablation of thyroid).  10.Left knee surgery in 94 and 95.   ALLERGIES:  None.   MEDICATIONS:  1. Aspirin 81 mg daily.  2. Synthroid 0.137 mg daily.  3. Potassium 20 mEq b.i.d.  4. Atacand 8 mg daily.  5. Coreg 12.5 mg b.i.d.  6. Lasix 40 mg b.i.d. with 40 mg p.r.n.   REVIEW OF SYSTEMS:  As stated in the HPI and otherwise negative for  other systems.   PHYSICAL EXAMINATION:  GENERAL:  The patient is in no distress.  VITAL SIGNS:  Blood pressure 120/80, heart rate 84 and regular, and  weight 233 pounds.  HEENT:  Eyelids unremarkable.  Pupils equal, round, and reactive to  light.  Fundi not visualized.  Oral mucosa unremarkable.  NECK:  No jugular venous distension at 45 degrees.  Carotid upstroke  brisk and symmetric.  No  bruits.  No thyromegaly.  LYMPHATICS:  No cervical, axillary or inguinal adenopathy.  LUNGS:  Clear to auscultation bilaterally.  BACK:  No costovertebral angle tenderness.  CHEST:  Unremarkable.  HEART:  PMI not displaced or sustained.  S1 and S2 within normal limits.  No S3, no S4.  No clicks, no rubs, no murmurs.  ABDOMEN:  Obese, positive bowel sounds, normal in frequency and pitch.  No bruits, no rebound, no guarding. No midline pulsatile mass. No  hepatomegaly.  No splenomegaly.  SKIN:  No rashes.  No nodules.  EXTREMITIES:  Pulses are 2+ throughout.  No edema.  No cyanosis.  No  clubbing.  NEUROLOGIC:  Oriented to person, place, and time.  Cranial nerves II  through XII grossly intact.  Motor grossly intact.   EKG, sinus rhythm, rate 84, right bundle-branch block, left anterior  fascicular block, no acute ST-T wave changes.   ASSESSMENT AND PLAN:  1. Cardiomyopathy.  The patient has class II symptoms.  He would call      up for an ICD, but does not want to consider this until he figures      out his finances and his insurance.  For now, we are going to       continue to titrate his meds.  I am going to increase his      Carvedilol to 18.75 mg b.i.d.  He will continue with the current      dose of Atacand.  The goal of this carvedilol is 50 mg twice a day      and Atacand 16, though I do not think his blood pressure will allow      [place] to get there.  2. Palpitations.  Again, I have tried to get him to wear a monitor.      He did not want to do this.  I have tried to get him to consent for      an ICD and he will eventually, but not now.  We have checked his      electrolytes and they were within normal limits.  He was having his      thyroid followed by his primary care doctor, though she has moved      on and he needs a new primary care physician.  I am going to help      him arrange a primary care doctor.  3. Obesity.  Understands the need to lose weight with diet and      exercise.  4. Hypothyroidism as above.  I will try to get him a primary care      doctor.  I can check his thyroid profile with labs in the future.  5. Followup.  I will see him back in 2 months.     [name], MD, [facility]  Electronically Signed   JH/MedQ  DD: 03/08/2008  DT: 03/08/2008  Job #: [id number]

## 2010-06-02 NOTE — Assessment & Plan Note (Signed)
Raymond [name]                          CHRONIC HEART FAILURE NOTE   Raymond [name], Raymond [name]                          MRN:          [id number]  DATE:01/26/2007                            DOB:          04/16/1960    PRIMARY CARDIOLOGIST:  Dr. [name].   PRIMARY CARE:  Dr. Talmadge Coventry.   Raymond [name] returns today for followup of his congestive heart failure  which is secondary to nonischemic cardiomyopathy.  I last saw Raymond [name] in  October status post hospitalization for acute on chronic exacerbation of  his heart failure.  Raymond [name] had developed a cough initially thought was  secondary to his ACE inhibitor, but the coughing continued.  Finally  decided to admit the patient for IV diuresis and cardiac  catheterization.  We did a right and left heart cath on him and it did  not show any coronary artery disease.  EF was calculated 10-20% with  inferior posterior akinesis, global hypokinesis, mild mitral  regurgitation, and left atrial enlargement.  Raymond [name] responded well to  the therapy and was discharged home.  Of note, he did have a 17 beat run  of wide complex tachycardia during that hospitalization in the setting  of normal electrolytes.  He followed up with me in October.  That time  he was stable, without signs of volume overload.  I continued Raymond [name] on  Atacand, provided him with samples as he could not afford the  prescription at this time.  Would like to try and switch him back to an  ACE inhibitor at some point if he is stable.  I recently had a 2-D  echocardiogram repeated.  Unfortunately, his EF is now 10-20% with a  mildly elevated pulmonary artery systolic pressure.  Unfortunately Mr.  Raymond [name] has a history of intolerance to beta blocker secondary to  respiratory compromise and wheezing.  He has intolerance to ACE  inhibitors secondary to increased cough.  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  He also is  a Chief of Staff and works out at [employer] up to 5 days a week.  However, his stamina has decreased significantly over the last year.  He  also becomes more dyspneic with exertion although he keeps pushing  himself.  Since being discharged from the hospital, his weight has gone  up 16 pounds.  However, Raymond [name] has also been doing significant  weightbearing exercises at the gym in addition to some limited cardio.  He has also been eating differently over the last few weeks secondary to  the holidays.  In talking with him, it turns out he has been eating some  homemade soup that contains V-8 and tomato juice, (both canned  products).  Raymond [name] does complain of some mild abdominal bloating but he  does also state he has increased muscle mass through his chest and arms.  Unfortunately, he does not have any scales at home, so he does not weigh  on a  regular basis, but states he does weigh intermittently at his  mother's house.  The cough has also returned which is what precipitated  his hospitalization at the end of 2008, although he denies any orthopnea  or PND.  He states sometimes at night, he just cannot sleep.  He is  restless.  He bends over and stoops over at work, he feels a heaviness  in his chest and neck area.   PAST MEDICAL HISTORY:  1. Congestive heart failure secondary nonischemic cardiomyopathy with      EF currently 10-20%  2. One episode of wide complex tachycardia during recent      hospitalization.  3. History of ACE inhibitor intolerance felt to be contributing factor      cough.  4. History of beta blocker intolerance causing respiratory compromise.  5. History of hypertension.  6. History of PAF.  7. Previous history of hyperthyroidism which was treated with      resultant hypothyroidism.  8. History of medical noncompliance.  9. Illiteracy and dyslexia.  Note, Raymond [name] can read  but it is unclear      as to what grade level he is comfortable with.  Also dyslexic.  He      states sometimes it just takes a much longer to reading and      comprehend written information.   REVIEW OF SYSTEMS:  As stated above.   CURRENT MEDICATIONS:  1. Include Synthroid 137 mcg daily.  2. Aspirin 81.  3. Lasix 40 b.i.d.  4. KCl 20 b.i.d.  5. Atacand 4 mg daily.   PHYSICAL EXAM:  Weight 233, blood pressure 108/74, pulse 99, pulse ox  95% on room air.  Raymond [name] is in no acute distress.  No signs of jugular vein distention at 45 degree angle.  Lungs are clear to auscultation bilaterally.  Cardiovascular exam reveals S1-S2, regular rate and rhythm, slightly  tachycardiac.  ABDOMEN:  Soft, nontender, positive bowel sounds.  He has some abdominal  distention noted.  Lower extremities without clubbing, cyanosis or  edema.  Neurologically alert and oriented x3.   IMPRESSION:  Congestive heart failure secondary to ischemic  cardiomyopathy without significant signs of volume overload.  However,  now with returning cough which initiated his hospitalization recently  for volume overload.  Raymond [name] does have some abdominal bloating, he  states, his weight has significantly increased.  Difficult to evaluate  him sometimes secondary to his muscle bulk in his upper chest and neck.  Raymond [name] worries significantly about financial cost.  I have spent almost  an hour with him today discussing sudden cardiac death, medications, the  mortality involved with heart failure.  He has done amazingly well over  the last few years since his diagnosis, and the fact that he is still  able to work full-time, maintain a second job, and then work out at [employer] 5 days a week alone is extraordinary.  However, he continues to  maintain an ejection fraction of 10%, which places him at high risk for  sudden cardiac death.  Has not tolerated beta blocker in the past.  Is  tolerating the Atacand currently.  I am  going to go ahead and start him  on low dose digoxin and add spironolactone eventually.  He has agreed to  go ahead and talk with Dr. Graciela Husbands for further consideration of ICD  therapy.  I have given him several booklets and brochures to read over.  Has a friend who can help  him with some of the words if he is unable to  comprehend the information, and I strongly encouraged him to follow up  with Dr. Graciela Husbands for further discussion.  I would like to get blood work  today, but Raymond [name] states he cannot afford have blood work today because  his insurance does not cover it very well.  I told him he has had blood  work drawn when he comes back and I will submit the prescription for  day, digoxin on line for him, and I will ask that Dr. Graciela Husbands review his  medicines and add the spironolactone in when he sees him at his followup  appointment in 3-4 weeks.  I will be glad to see him back after that.  I  have also asked Raymond [name] to increase his Lasix to 80 mg for the next 3  mornings, continue the 40 mg in the afternoon, and double his morning  dose of Lasix for the next 3 days, and continue 20 in the evening.  If  this does not to resolve his cough or his weight does not decrease with  this dose, he is to call the office on Monday and ask for them to be in  touch with me so we can address the issue further.  Also would like  Raymond [name] to have a CPX test, but he states he can absolutely not afford  that at this time.      Dorian Pod, ACNP  Electronically Signed      Bevelyn Buckles. Bensimhon, MD  Electronically Signed   MB/MedQ  DD: 01/26/2007  DT: 01/26/2007  Job #: [id number]   cc:   Talmadge Coventry, M.D.

## 2010-06-02 NOTE — Assessment & Plan Note (Signed)
[facility]                          CHRONIC HEART FAILURE NOTE   JAMIEON, LANNEN                          MRN:          [id number]  DATE:09/07/2007                            DOB:          Apr 28, 1960    PRIMARY CARE PHYSICIAN:  Talmadge Coventry, MD.   PRIMARY CARDIOLOGIST:  [name], MD, [facility].   ELECTROPHYSIOLOGY:  Doylene Canning. [name], MD    Levoy returns today for further follow-up of his congestive heart  failure.  I last saw him in the clinic in July.  At which time, Ronelle  continue to have episodes of dizziness and palpitations with increased  fatigue and decreased energy level.  Today, he returns still complaining  of increased fatigue and presyncopal episode last week.  He is also had  chest discomfort with increased stress.  Bertran has a known ejection  fraction of 10-20% previously evaluated by Dr. Lewayne Bunting with  recommendations for ICD implantation secondary to high-risk for sudden  cardiac death.  Unfortunately, Axell is still try and decide whether  not he wants to proceed with ICD.  Please see my previous notes from  June and July for further documentation.  Today, Lavelle states he still  considering it.  He would like to wait; however, until he gets approved  for his disability.  We have been discussing this for quite sometime now  and Dom has very poor insight to his disease process and his high-  risk for sudden cardiac death even after multiple attempts to explain to  him.  I am actually questioned whether or not he wants to accept his  diagnosis at this point.  If he tells me he is to followed probably paid  500 dollars for.  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  He continues to do some maintenance on his health, although he is  symptomatic when he is out mowing.  When I discussed his high-risk for  sudden cardiac death with him.  Lebaron always states I cannot get the  procedure done right now  because I need to save that money to have this  done on my health etc.,  Even I have explained to him that it is not a  matter about his health if he has a sudden cardiac arrest event.  Travonne  all comes down to financial issues in regards to what he will do  medically.  He denies any orthopnea or PND.  He is complaining of  palpitations, nighttime sweats, and chest discomfort brought on by  increased stress.   PAST MEDICAL HISTORY:  1. Congestive heart failure secondary to nonischemic cardiomyopathy      with EF 10-20% with recommendations for ICD prophylactic      implantation by Dr. Lewayne Bunting with patient declining ICD therapy      at this time.  2. History of wide complex tachycardia with palpitations.  3. History of ACE inhibitor intolerance.  4. History of beta-blocker intolerance causing respiratory compromise      per the patient.  The patient currently tolerating carvedilol.  5. History of hypertension.  6. Paroxysmal atrial fib.  7. Hypothyroidism.  8. Ongoing medical noncompliance.  9. Literacy and dyslexia with poor insight to medical diagnosis.   REVIEW OF SYSTEMS:  As stated above.   CURRENT MEDICATIONS:  Synthroid 0.137 mg daily, aspirin 81 mg daily, KCl  20 mEq b.i.d., Atacand 8 mg daily, digoxin 0.25 mg daily, Xanax per  primary care, Lasix 80 in the morning, 40 in the evening, Coreg 6.25 mg  b.i.d.   CLINICAL DATA:  A 12-lead EKG showing sinus tachycardia with right  ventricular hypertrophy and nonspecific intraventricular block at a rate  of 103.  No change from previous EKGs.   PHYSICAL EXAM:  Weight 234 pounds, weight is up 3 pounds today, blood  pressure 120/44 with a heart rate of 96.  Repeat blood pressure after  ambulating 138/88, heart rate after ambulating 112.  Hasheem is in no  acute distress.  He has no signs of jugular vein distention at 45 degree  angle.  Lungs are clear to auscultation bilaterally.  Cardiovascular  exam reveals S1-S2, slightly  tachycardic.  Do not hear any murmurs, rubs  or gallops.  Abdomen, soft, nontender, positive bowel sounds.  Lower  extremities without clubbing, cyanosis or edema.  Neurologically alert  and oriented x3.   IMPRESSION:  Congestive heart failure secondary to nonischemic  cardiomyopathy with EF 10-20% with the patient refusing an ICD  consideration at this time.  I am going to increase his carvedilol to  9.375 mg in the evening for a week and then increase the morning dose  also to 9.375 mg within a new prescription to [facility] at [facility] for  his carvedilol.  Oberon is complaining of symptoms likely associated  with his heart failure although he has no signs of volume overload at  this time.  Once again have strongly encouraged him to follow up with  Dr. [name] for further consideration of ICD.  I am going to go ahead  arrange to have a CPX test done to further confirm my suspicions.      Dorian Pod, ACNP  Electronically Signed      [name], MD, [facility]  Electronically Signed   MB/MedQ  DD: 09/07/2007  DT: 09/08/2007  Job #: [id number]

## 2010-06-02 NOTE — Cardiovascular Report (Signed)
NAMERHIAN, FUNARI                 ACCOUNT NO.:  [id number]   MEDICAL RECORD NO.:  [id number]          PATIENT TYPE:  INP   LOCATION:  4703                         FACILITY:  [facility]   PHYSICIAN:  Everardo Beals. Juanda Chance, MD, FACCDATE OF BIRTH:  1960-02-06   DATE OF PROCEDURE:  11/08/2006  DATE OF DISCHARGE:                            CARDIAC CATHETERIZATION   CLINICAL HISTORY:  [name] is 50 years old and has a nonischemic  cardiomyopathy.  He recently has had increased symptoms of shortness of  breath and Dr. Antoine Poche admitted him to the hospital for further  treatment and for catheterization.   PROCEDURE:  Right heart catheterization was performed percutaneously via  the right femoral vein using a venous sheath and Swan-Ganz  thermodilution catheter.  Left heart catheterization was formed  percutaneously via the right femoral arterial sheath and 6-French  preformed coronary catheters.  A front wall arterial puncture was  performed and Omnipaque contrast was used.  We not do a left  ventriculogram because the ejection fraction by echocardiogram was  evaluated today and was 20%.  We did an oximetry run on the right side  because the patient had a patent foramen by echocardiography.   The patient tolerated the procedure well and left the laboratory in  satisfactory condition.  The right femoral artery was closed with Angio-  Seal at the end of the procedure.   RESULTS:  The left main coronary artery was free of significant disease.   The left anterior descending artery gave rise to a large diagonal  branch, a small diagonal branch and three septal perforators.  These and  the LAD were free of significant disease.   The circumflex artery gave rise to an atrial branch, a marginal branch,  and a posterolateral branch.  These vessels were free of significant  disease.   The right coronary artery is a moderate-sized vessel, gave rise to a  conus branch, a posterior descending branch, and  three posterolateral  branches.  These vessels were free of significant disease.   No left ventriculogram was performed.   The right atrial pressure was 11 mean.  The pulmonary artery pressure  was 52/26 with a mean of 38.  Pulmonary wedge pressure was 27-30 mean.  Left ventricular pressure was 104/27.  The aortic pressure was 104/74  with mean of 85.  Cardiac output/cardiac index was 5.9/3.7  meters/minutes/meters squared.   An oxygen saturation was run with findings as follows:  Pulmonary artery  saturation 68%.  Right ventricular saturation 68%.  Low and high right  atrium saturation 68%.  Mid right atrium saturation 71%.  Superior vena  cava saturation 72%, IVC saturation 77%, and aortic saturation 94%.   CONCLUSION:  1. Normal coronary angiography.  2. Nonischemic cardiomyopathy with ejection fraction 20% by      echocardiogram and elevated pulmonary wedge pressure of 27-30 mmHg.  3. No significant atrial level shunt by oximetry (patent foramen ovale      by echocardiogram).   RECOMMENDATIONS:  I discussed the findings with Dr. Antoine Poche.  Will plan  diuresis and further titrating of  his medications for left ventricular  dysfunction and systolic heart failure.      Bruce Elvera Lennox Juanda Chance, MD, [facility]  Electronically Signed     BRB/MEDQ  D:  11/08/2006  T:  11/09/2006  Job:  [id number]   cc:   [name], MD, [facility]  Talmadge Coventry, M.D.  Cardiopulmonary Lab

## 2010-06-02 NOTE — Assessment & Plan Note (Signed)
[facility]                         ELECTROPHYSIOLOGY OFFICE NOTE   Raymond [name], Raymond [name]                          MRN:          [id number]  DATE:02/16/2007                            DOB:          09-05-1960    REQUESTING PHYSICIAN:  [name], MD.   RECOMMENDATIONS:  Consultation is to make recommendations regarding  feasibility of ICD implantation and additional treatment of congestive  heart failure.   HISTORY OF PRESENT ILLNESS:  The patient is a very pleasant 50 year old  man with nonischemic cardiomyopathy dating back over a year.  He has  been intolerant of beta blockers in the past, intolerant of ACE  inhibitors in the past, but has tolerated Atacand, digoxin and diuretics  very nicely.  The patient is referred today for consideration for ICD  implantation.  He has never had syncope.   PAST MEDICAL HISTORY:  1. He has class II heart failure symptoms.  2. Notable for nonischemic cardiomyopathy with most recent LVEF of      10%.  3. He has episodes of nonsustained VT.  4. He has a history of paroxysmal AFib.  5. He has a history of hypertension which has been mild.   FAMILY HISTORY:  Notable for father dying in his 36s of heart disease.  His mother is still living.   SOCIAL HISTORY:  The patient is divorced.  He repairs cars.  He has no  children.  He denies tobacco or ethanol abuse.   PAST SURGICAL HISTORY:  None.   REVIEW OF SYSTEMS:  As noted in the HPI, otherwise all systems were  reviewed and found to be negative.   PHYSICAL EXAMINATION:  GENERAL:  He is a pleasant, well-appearing young  man in no distress.  VITAL SIGNS:  Blood pressure today was 120/79, pulse was 100 and  regular,  respirations were 18.  Weight was 235 pounds.  HEENT:  Normocephalic and atraumatic.  Pupils are equal and round.  Oropharynx is moist.  Sclerae anicteric.  NECK:  Revealed no thyromegaly.  The trachea is midline.  There is 7 cm  jugular venous  distention.  Carotids are 2+ and symmetric.  LUNGS:  Clear bilaterally to auscultation.  No wheezes, rales or rhonchi  are present.  There is no increased work of breathing.  CARDIOVASCULAR:  Reveals regular tachycardia with normal S1-S2.  The PMI  was enlarged and laterally displaced.  The patient had a very soft S3  gallop.  ABDOMINAL:  Soft, nontender, nondistended.  There is no organomegaly.  Bowel sounds are present.  There is no rebound or guarding.  EXTREMITIES:  Demonstrate no cyanosis, clubbing or edema.  Pulses were  2+ and symmetric.  NEUROLOGIC:  Alert and oriented x 3.  Cranial nerves are intact.  Strength is 5/5 and symmetric.  SKIN:  Normal.  MUSCULOSKELETAL:  Normal.   DIAGNOSTICS:  Prior EKG demonstrates sinus rhythm with right bundle  branch block and left anterior fascicular block.   IMPRESSION:  1. Nonischemic cardiomyopathy.  2. Class II congestive heart failure, ejection fraction 10%.  3. History of hypertension.   DISCUSSION:  I discussed the treatment options with [name] in detail.  He clearly has an indication for prophylactic ICD insertion based on the  SCD-HeFT.  The risks, benefits, goals expectations of the procedure have  been discussed with the patient in detail.  He is considering his  options and will call us if he would like to proceed with ICD implant.     Doylene Canning. [name], MD  Electronically Signed    GWT/MedQ  DD: 02/16/2007  DT: 02/16/2007  Job #: [id number]   cc:   Talmadge Coventry, M.D.

## 2010-06-02 NOTE — Assessment & Plan Note (Signed)
[facility]                          CHRONIC HEART FAILURE NOTE   Raymond [name], Raymond [name]                          MRN:          [id number]  DATE:01/02/2008                            DOB:          11/22/1960    PRIMARY CARDIOLOGIST:  [name], MD, [facility]   ELECTROPHYSIOLOGIST:  Doylene Canning. [name], MD   [name] returns today for further followup of his congestive heart  failure which is secondary to nonischemic cardiomyopathy with EF of 10-  20% with recommendations for ICD prophylactic implantation by Dr. [name]  with the patient declining ICD therapy at this time citing financial  distress, pending disability assistance.  When I last saw Rodman back in  November, I had asked him (for the second month in a row) to increase  his carvedilol to 12.5 mg b.i.d.  He states he is still taking the 9.375  mg b.i.d.  He complains of occasional dizziness, occasional  palpitations.  He is unable to correlate the palpitations with the  dizziness.  He states the dizziness occurs if he stands too quickly or  tries to walk too fast.  Palpitations are just sporadic.  He describes  fleeting chest discomfort at times, otherwise no change in status.   PAST MEDICAL HISTORY:  1. Congestive heart failure with EF of 10-20% in the setting of      nonischemic cardiomyopathy.  2. History of wide-complex tachycardia with palpitations.  3. History of ACE inhibitor and beta-blocker intolerance; however, the      patient currently tolerating medications.  4. History of medical noncompliance.   REVIEW OF SYSTEMS:  As stated above.   Current medications include:  1. Aspirin 81.  2. Synthroid 0.137 mcg.  3. Potassium 20 mEq b.i.d.  4. Atacand 8 mg daily.  5. Lasix 80 in the morning and 40 in the evening.  6. Carvedilol 9.375 mg b.i.d.   PHYSICAL EXAMINATION:  VITAL SIGNS:  Weight 231, weight is up 2 pounds,  blood pressure 105/69 and 104/70, heart rate documented as 49.  A  12-  lead EKG, heart rate is 75.  GENERAL:  [name] is in no acute distress.  NECK:  No signs of jugular vein distention at 45 degree angle.  LUNGS:  Clear to auscultation.  CARDIOVASCULAR:  S1 and S2.  Regular rate and rhythm.  ABDOMEN:  Soft, nontender.  Positive bowel sounds.  LOWER EXTREMITIES:  Without clubbing, cyanosis, or edema.   IMPRESSION:  Congestive heart failure secondary to nonischemic  cardiomyopathy with ejection fraction of 10-20% with the patient  refusing implantable cardioverter-defibrillator consideration at this  time.  Once again, I have asked him to increase his carvedilol to 12.5  mg b.i.d.  I am going to cut his Lasix back to 40 b.i.d.  The patient is  currently tolerating medications without significant dizziness or  lightheadedness.  I am going to have him follow up with Dr. Antoine Poche for  routine cardiology visit.      Dorian Pod, ACNP  Electronically Signed      [name], MD, [facility]  Electronically Signed  MB/MedQ  DD: 01/02/2008  DT: 01/02/2008  Job #: [id number]

## 2010-06-02 NOTE — Assessment & Plan Note (Signed)
[facility]                            CARDIOLOGY OFFICE NOTE   LOUI, MASSENBURG                          MRN:          [id number]  DATE:02/06/2008                            DOB:          1960-02-10    PRIMARY CARE PHYSICIAN:  Talmadge Coventry, MD   REASON FOR PRESENTATION:  Evaluate the patient with nonischemic  cardiopathy and presyncope.   HISTORY OF PRESENT ILLNESS:  The patient presents for followup.  He said  that on January 08, 2008, he ran to his mailbox.  He was anxious to see  what was in there.  This was the most exerting thing he has done in a  while.  He said with that he felt his heart racing he thought out of  proportion to what he was doing.  He felt somewhat presyncopal.  He came  back into the house.  He had to wait quite a while until things settle  down.  He never lost consciousness.  He did not have any chest  discomfort, neck or arm discomfort.  He was short of breath with this  level of activity.  He has not had any new PND or orthopnea.  He has had  some rare intermittent palpitations since then, but no presyncope or  syncope.  He just has felt very fatigued since that time.  He has not  been sleeping well.  He has had no chest pressure, neck or arm  discomfort.  He has been compliant with his medications.  He is  complaint with salt and fluid restriction.   PAST MEDICAL HISTORY:  Systolic heart failure (EF 10-20%, nonischemic),  palpitations with wide complex tachycardia in the past (the patient has  refused an ICD), previous history of ACE inhibitor intolerance, obesity,  dyslexia, bifascicular block, hypertension, history of paroxysmal atrial  fibrillation, hypothyroidism (status post radioiodine ablation of  hypothyroidism), and left knee surgery in 1994 and 1995.   ALLERGIES:  None.   MEDICATIONS:  1. Aspirin 81 mg daily.  2. Synthroid 0.137 mg daily.  3. Potassium 20 mEq b.i.d.  4. Atacand 8 mg daily.  5.  Coreg 12.5 b.i.d.  6. Lasix 40 mg b.i.d.   REVIEW OF SYSTEMS:  As stated in the HPI and otherwise negative for all  other systems.   PHYSICAL EXAMINATION:  GENERAL:  The patient is in no distress.  VITAL SIGNS:  His blood pressure 116/72, heart rate 53 and regular,  weight 231pounds, and body mass index 33.  HEENT:  Eyelids are unremarkable.  Pupils are equal, round, and reactive  to light.  Fundi not visualized.  Oral mucosa unremarkable.  NECK:  No jugular venous distension at 45 degrees, carotid upstroke  brisk and symmetrical, no bruits, no thyromegaly.  Right carotid bruit.  LYMPHATICS:  No cervical, axillary, or inguinal adenopathy.  LUNGS:  Clear to auscultation bilaterally.  BACK:  No costovertebral angle tenderness.  CHEST:  Unremarkable.  HEART:  PMI not displaced or sustained.  S1 and S2 within normal limits.  No S3, no S4.  No clicks, no rubs, no  murmurs.  ABDOMEN:  Obese, positive bowel sounds, normal in frequency and pitch,  no bruits, no rebound, no guarding, no midline pulsatile mass, no  hepatomegaly, no splenomegaly.  SKIN:  No rashes, no nodules.  EXTREMITIES:  Pulses 2+ throughout.  No edema, no cyanosis, no clubbing.  NEUROLOGIC:  Oriented to person, place, and time.  Cranial nerves II  through XII grossly intact, motor grossly intact.   DIAGNOSTIC DATA:  EKG, sinus rhythm, rate 82, right bundle-branch block,  left anterior fascicular block, QTc is prolonged, no significant change  from previous EKGs.   ASSESSMENT AND PLAN:  1. Dizziness.  The patient had an episode of dizziness and presyncope.      I am quite concerned about this as in a rhythmic etiology.  He      understands the risk of sudden death and this situation with his      cardiomyopathy.  He is steadfast.  He refused an ICD, mostly      because of his insurance and work purposes.  We will continue to      discuss this.  In the meantime, I will place a 48-hour Holter      monitor to make sure he  does not have any malignant dysrhythmias      identifiable, which may convince him to let us put him on the      defibrillator.  I am going to check his electrolytes including      magnesium. Otherwise, continue the meds as listed.  2. Fatigue.  The patient is not sleeping at night.  He states he feels      jittery.  He wants to use over-the-counter Benadryl to see if      this will help.  This may explain his fatigue.  3. Fatigue as above.  I am not going to back off on his medicines.  I      think mostly the sleep issue is the etiology.  Let me know if this      worsens.  4. Paroxysmal atrial fibrillation.  He has had no symptomatic      recurrence of this.  So again, we will have to monitor this to see      if he is having any of these spells.  5. Hypertension.  Blood pressure is well controlled with meds as      listed.  6. Followup.  I am going to see him back in 1 month or sooner based on      the symptoms and the results of the above.     [name], MD, [facility]  Electronically Signed    JH/MedQ  DD: 02/06/2008  DT: 02/07/2008  Job #: [id number]   cc:   Talmadge Coventry, M.D.

## 2010-06-02 NOTE — Assessment & Plan Note (Signed)
[facility]                             PULMONARY OFFICE NOTE   Raymond, [name]                          MRN:          [id number]  DATE:10/25/2006                            DOB:          [birth year]    PULMONARY CONSULTATION:  I met Raymond [name] today for evaluation of his  cough.   He was recently seen by Dr. Antoine Poche for his cough as well as his heart  failure.  He was diagnosed several years ago with tachycardia and then  was later found to have hyperthyroidism.  He had undergone radioiodine  ablation of his thyroid and is now currently hypothyroid.  As a result  of all of this, he developed a nonischemic cardiomyopathy with the  previous ejection fraction of approximately 25%.  He had been on both  beta blockers and ACE inhibitors previously and these were stopped as a  result of concern for his cough.  He says that his cough symptoms have  become more problematic over the last few months.  He was tried again on  bisoprolol but he says that his cough got worse as well as feeling like  he could not catch his breath.  Currently he has cough throughout the  day as well as at night.  He says it is more problematic in the morning,  however, when he produces clear sputum, otherwise is usually dry.  He  has also been having problems feeling short of breath.  He goes to the  gym on a regular basis and says that over the last few months he has  just not been able to do the same kind of activities as he was before.  He did have a problem with allergies before and he used to take Claritin-  D.  He currently is on oxymetazoline, which is a nasal spray that he  gets over the counter for sinus problems and postnasal drip.  He says he  uses this on a regular basis.  He also gets a globus sensation in the  back of his throat.  He denies any hemoptysis.  He does get occasional  wheezing, which he says is more problematic when he is lying flat.  He  denies any  symptoms of reflux.  He has not had any recent sick  exposures.  There is no history of previous pneumonia or tuberculosis.  He says that he does have a problem if he is around tobacco smoke as  well as with the change of the seasons.  He does own a dog but does not  have any other significant animal exposures.  There is no recent travel  history.   PAST MEDICAL HISTORY:  1. Hyperthyroidism, status post radioiodine ablation, now with      hypothyroidism.  2. Nonischemic cardiomyopathy secondary to his thyroid disease.  3. Bifascicular block.  4. Hypertension.  5. Paroxysmal atrial fibrillation.  6. Intolerance of ACE inhibitor secondary to cough.  7. Intolerance of beta blockers secondary to cough.  8. Dyslexia.  9. Ventricular tachycardia.  10.Left knee surgery in 1994 and  1995.   He has no known drug allergies.   CURRENT MEDICATIONS:  1. Synthroid 137 mcg daily.  2. Aspirin 325 mg daily.  3. Lasix 20 mg b.i.d.  4. Potassium 20 mEq daily.  5. CV[employer] Nasal Mist as needed.   SOCIAL HISTORY:  He is divorced.  He has no children.  He works as a  Curator.  There is no history of tobacco or alcohol use.   FAMILY HISTORY:  Significant for his father, who had diabetes and heart  disease.  His mother had diabetes and cancer and he has a sister with  cancer.   REVIEW OF SYSTEMS:  He says that he has lost approximately 6-7 pounds  over the last couple of months but he says he has been trying to do  this.   PHYSICAL EXAM:  He is 5 feet 11 inches tall, 228 pounds.  Temperature is  97.3, blood pressure is 108/80, heart rate is 100, oxygen saturation is  98% on room air.  HEENT:  Pupils reactive.  He has narrow nasal angles with alar collapse  on inhalation.  He has a clear nasal discharge.  He has a Mallampati 2  airway with decreased AP diameter of the oropharynx and mild erosion  over his teeth.  There is no lymphadenopathy, no thyromegaly.  HEART:  S1-S2.  CHEST:  There were  decreased breath sounds but no wheezing or rales.  ABDOMEN:  Obese, soft, nontender, positive bowel sounds.  EXTREMITIES:  There was no edema, cyanosis or clubbing.  NEUROLOGIC:  No focal deficits were appreciated.   He had a chest x-ray in September 2008 which showed cardiomegaly and  mild bronchitic changes.   IMPRESSION:  1. Dyspnea in the setting of chronic cough.  I have discussed with him      that in an individual who does not have a history of smoking and an      unremarkable chest x-ray, his symptom description is most likely      one of three things, either sinus disease with postnasal drip,      asthma, or reflux disease.  He is on over-the-counter sinus      medicine.  He does have a symptom description which would be      consistent with rhinitis, and I will try to optimize his therapy      for this.  I will therefore start him on Nasonex nasal spray two      sprays in each nostril once daily.  I have also advised him on the      use of nasal irrigation.  In addition, to help break the cyclic      nature of his cough, I have advised him to try using over-the-      counter Delsym as directed as well as to keep a sugarless candy in      his mouth to help maintain his posterior pharynx lubrication.  He      is also to do salt water gargles.  I am still concerned he may have      an element of obstructive airways disease even though he did not      seem to have any significant symptomatic benefit from the use of      his ProAir HFA.  I will therefore arrange for him to undergo a      pulmonary function test to further assess this.  He does not have      much  with regard to reflux symptoms at the present time, and      therefore I will defer starting him on any antireflux medications      for now.  However, if he is still symptomatic after the above      interventions, we may need to pursue this avenue.  2. Sleep disruption, symptoms of excessive daytime sleepiness in the       setting of cardiomyopathy and hypothyroidism.  I am concerned that      he may possibly have some degree of sleep-disordered breathing.  To      further evaluate this, I will have him undergo an overnight      polysomnogram.  3. Nonischemic cardiomyopathy.  He is to have an echocardiogram done      later this month and then this will be further assessed by Dr.      Antoine Poche.  4. Hypothyroidism.  He is to have follow-up with his primary care      physician for this.   I will follow up with him in approximately 3-4 weeks.  If he is still  having problems with dyspnea after the above interventions, then it may  be necessary for him to undergo a cardiopulmonary exercise test.     Coralyn Helling, MD  Electronically Signed    VS/MedQ  DD: 10/25/2006  DT: 10/26/2006  Job #: [id number]   cc:   [name], MD, [facility]  Talmadge Coventry, M.D.

## 2010-06-02 NOTE — H&P (Signed)
[name], [name]:  [id number]   MEDICAL RECORD NO.:  [id number]          PATIENT TYPE:  INP   LOCATION:  4703                         FACILITY:  [facility]   PHYSICIAN:  [name], MD, FACCDATE OF BIRTH:  1960/12/25   DATE OF ADMISSION:  11/08/2006  DATE OF DISCHARGE:                              HISTORY & PHYSICAL   PRIMARY CARDIOLOGIST:  [name], MD, [facility]   PRIMARY CARE PHYSICIAN:  Talmadge Coventry, M.D.   HISTORY OF PRESENT ILLNESS:  This is a 50 year old Caucasian male  patient who is being admitted for  right heart catheter with left heart  pressures secondary to his of nonischemic cardiomyopathy.  The patient  has been seen by Dr. Daiva Nakayama in his office on November 06, 2006, with  the discussion of need of  right heart catheterization.  The patient  went home and wanted to think about it and called the office on November 07, 2006, and agreed to be admitted to have further evaluations  completed.  The patient has other history of hypertension, paroxysmal  atrial fibrillation, hyperthyroidism treated with resultant  hypothyroidism.  The patient also has a history of medical noncompliance  secondary to insurance restrictions and cost.   The patient has had increasing cough and shortness of breath on  exertion.  He has class III congestive heart failure symptoms. His last  echocardiogram was in 2006, revealing an EF of 20 to 25%.  He has not  had a subsequent echocardiogram secondary to insurance restrictions.  The patient was also advised to have pulmonary function testing and  pulmonary evaluation, again this was not completed secondary to  insurance restrictions.   The patient was last seen by Dr. Antoine Poche on the date describe above  with increasing complaints.  The patient did not have shortness of  breath at rest but he did have dyspnea on exertion.  He denied any chest  pain.  He denied any diaphoresis or dizziness.  He does have  some  complaints of generalized fatigue, however.   The patient has agreed to be admitted with right heart catheter and left  heart pressures to be completed today per Dr. Antoine Poche.   REVIEW OF SYSTEMS:  Dyspnea on exertion, frequent cough, and generalized  fatigue with congestive heart failure class III symptoms.   PAST MEDICAL HISTORY:  1. Nonischemic cardiomyopathy, EF of 20 to 25% by echocardiogram 2006.  2. Hyperthyroidism treated with resultant hypothyroidism.  3. Hypertension.  4. Paroxysmal atrial fibrillation.  5. Previous history of ventricular tachycardia.  6. Medical noncompliance secondary to cost.  7. Illiteracy and dyslexia.   PAST SURGICAL HISTORY:  None.   FAMILY HISTORY:  History of father dying of heart problems at age 8.  There is no family history of cardiomyopathy.   SOCIAL HISTORY:  The patient is divorced, he repairs cars.  He does not  have children and he does not smoke and does not drink alcohol.   CURRENT MEDICATIONS:  1. Synthroid 137 mcg daily.  2. Aspirin 325 mg daily.  3. Potassium 20 mEq daily.  4. Furosemide 20 mg b.i.d.  5. Atacand 4 mg p.o. daily.   ALLERGIES:  INTOLERANCE TO ACE INHIBITOR CAUSING COUGH.   PHYSICAL EXAMINATION:  VITAL SIGNS:  Blood pressure 109/74, heart rate  114, respirations 18, temperature 97.4, O2 saturation 96% on room air.  HEENT:  Head is normocephalic and atraumatic.  Eyes:  PERRLA.  Mucous  membranes, mouth pink and moist.  Tongue is midline.  NECK:  Supple.  There is no JVD, no carotid bruits appreciated.  CARDIOVASCULAR:  Regular rate and rhythm, tachycardic with S3 murmur  auscultated without rubs or gallops.  LUNGS:  Essentially clear to auscultation with frequent cough on  inspiration.  ABDOMEN:  Soft, nontender, 2+ bowel sounds.  No abdominal bruits are  appreciated.  EXTREMITIES:  They are cool below the ankle with dorsalis pedis pulses,  1+ bilaterally and radial pulses 1+ bilaterally.  SKIN:  Warm  and dry.  NEUROLOGIC:  Intact.   PLAN:  The patient will be scheduled for  right heart catheterization  with left heart pressures for further evaluation of congestive heart  failure and pulmonary issues.  The patient will continue on his current  medications prior to admission until advised by Dr. [name] for  changes based upon results of catheterization.  I have discussed  catheterization procedure, risks and benefits and the patient is willing  to proceed and consent form has been signed.  We will make further  recommendations on this patient depending upon hospital course and  response to medications and test results.      Bettey Mare. Lyman Bishop, NP      [name], MD, [facility]  Electronically Signed    KML/MEDQ  D:  11/08/2006  T:  11/09/2006  Job:  [id number]

## 2010-06-02 NOTE — Assessment & Plan Note (Signed)
[facility]                          CHRONIC HEART FAILURE NOTE   Raymond [name], Raymond [name]                          MRN:          [id number]  DATE:12/01/2007                            DOB:          1960-11-15    PRIMARY CARDIOLOGIST:  [name], MD, [facility]   ELECTROPHYSIOLOGIST:  Doylene Canning. [name], MD   PRIMARY CARE PHYSICIAN:  Dr. Talmadge Coventry.  She is no longer  practicing.   Raymond [name] returns today for further followup of his congestive heart  failure, which is secondary to nonischemic cardiomyopathy with an EF of  10-20%, was recommendations for ICD prophylactic implantation by Dr.  Lewayne Bunting with the patient declining ICD therapy at this time, citing  financial distress, pending disability assistance.  Raymond [name] states he has  been doing well since I last saw him in September 2009.  Continues to  have early class III symptoms, occasional dizziness, ongoing fatigue,  and decreased energy.  Denies any symptoms suggestive of volume  overload.   PAST MEDICAL HISTORY:  Congestive heart failure with EF of 10-20% in the  setting of nonischemic cardiomyopathy with a history of wide complex  tachycardia with palpitations, with a history of ACE inhibitor, and beta-  blocker intolerance.  However, the patient currently is tolerating  carvedilol titration.   REVIEW OF SYSTEMS:  As stated above, otherwise negative.   CURRENT MEDICATIONS:  1. Aspirin 81 mg daily.  2. Synthroid 0.137 mg daily.  3. Potassium 20 mEq b.i.d.  4. Atacand 80 mg daily.  5. Digoxin 0.25 mg daily.  6. Lasix 80 mg in the morning and 40 in the afternoon.  7. Carvedilol is currently 9.375 mg b.i.d.   P.R.N. MEDICATIONS:  1. Nasal spray.  2. Xanax.  3. Lasix 40.   REVIEW OF SYSTEMS:  As stated above, otherwise negative.   PHYSICAL EXAMINATION:  VITAL SIGNS:  Weight 229 pounds, weight up 4  pounds, blood pressure 129/82, and heart rate 85.  GENERAL:  A 12-lead EKG showing  sinus rhythm with a right bundle-branch  block, left posterior fascicular block at a rate of 85.  No change from  previous EKGs.  Raymond [name] is in no acute distress.  NECK:  He has no signs of jugular vein distention at 45-degree angle.  LUNGS:  Clear to auscultation bilaterally.  CARDIOVASCULAR:  S1 and S2.  Regular rate and rhythm.  ABDOMEN:  Soft and nontender.  Positive bowel sounds.  Do not appreciate  any hepatomegaly.  LOWER EXTREMITIES:  Without clubbing or cyanosis.  He has trace edema  bilaterally.  NEUROLOGIC:  Alert and oriented x3.   IMPRESSION:  Congestive heart failure secondary to nonischemic  cardiomyopathy with EF 10-20% with recommendations for implantable  cardioverter-defibrillator prophylactic implantation by Dr. Lewayne Bunting  with the patient declining implantable cardioverter-defibrillator  therapy at this time in the setting of wide complex tachycardia with  palpitations.  We will increase Raymond [name]'s carvedilol to 12.5 mg b.i.d.  Hopefully, will tolerate full dose, and I will plan on him following up  with Dr. [name] and Dr. Antoine Poche in the near  future.      Dorian Pod, ACNP  Electronically Signed      [name], MD, [facility]  Electronically Signed   MB/MedQ  DD: 12/01/2007  DT: 12/01/2007  Job #: [id number]

## 2010-06-02 NOTE — Letter (Signed)
March 07, 2008    [employer]  [place]   RE:  [name], Raymond [name]  MRN:  [id number]  /  DOB:  1960-06-22   Dear Carmelina Dane;   This letter concerns [name].  He has a history of congestive  heart failure with a nonischemic cardiomyopathy and a severely reduced  ejection fraction.  He has been under medical management for this.  Currently, he has continued shortness of breath with activities.  He  also has dizziness.  He also has other medical problems including  paroxysmal atrial fibrillation and hypothyroidism.  These are all being  managed medically.  The patient wanted a written statement of his  ongoing medical problems.  He has been seen in this clinic for these  problems dating back to 2004.  However, he was seen in the hospital and  noted to have reduced ejection fraction a few years before this.   Thank you for your attention to this matter.  If you need further  information regarding this patient, please contact our office at [phone]  [phone].    Sincerely,      [name], MD, [facility]  Electronically Signed    JH/MedQ  DD: 03/07/2008  DT: 03/08/2008  Job #: [id number]

## 2010-06-02 NOTE — Assessment & Plan Note (Signed)
[facility]                          CHRONIC HEART FAILURE NOTE   YARETH, MACDONNELL                          MRN:          [id number]  DATE:06/05/2007                            DOB:          11-Sep-1960    PRIMARY CARDIOLOGIST:  [name], MD, [facility].   PRIMARY CARE:  Talmadge Coventry, M.D.   ELECTROPHYSIOLOGIST:  Doylene Canning. [name], MD.   Damarea returns today for follow-up of his congestive heart failure which  is secondary to nonischemic cardiomyopathy.  I have not seen him here in  the heart failure clinic since January.  I had referred him to  electrophysiology for consideration of an ICD.  He did see Dr. [name] at  that time in consultation.  Dr. [name] felt patient clearly had  indication for prophylactic ICD insertion based on the SCD-HeFT trial.  The risks, benefits and goals, expectations of the procedure were  discussed with the patient in detail and at that time, [name] was  considering his options and was to call the office and let Dr. [name]  know when he wanted to proceed with the ICD implant.  Brysten has not  followed up here with Dr. [name] since that time either.  Today he  states he came in because he was told that he had a ruptured blood  vessel in his eye and patient reports he was told it might be because  his blood pressure was too high, so he felt that he ought to come in and  get checked out and make sure there was something going on.  He states  he was going to have his blood pressure checked at the drug store but he  never did get it checked and just assumes it has been on okay.  Since I  saw Koua in January, he has been laid off from his job.  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  Monish states he has been feeling more  fatigued than usual for  him.  He is also complaining of dyspnea with exertion.  He states he is  now short of breath if he climbs a flight of steps.  He has also  experienced increased fluid retention.  He has been taking an extra dose  of Lasix, he states about once a week, and he also complains of some  palpitations and some tightness with increased emotional stress.  He has  had multiple financial concerns in the past, but states now that he is  laid off from work, it seems like that has stressed him even more.  He  spoke with some makers of Atacand and was able to get his Atacand for  free through the company.  States he is limited right now to [employer] and he is using [employer] for his insurance coverage.  This  feels like  his heart failure has taken a turn for the worse.   PAST MEDICAL HISTORY:  1. Congestive heart failure secondary to nonischemic cardiomyopathy      with EF of 10-20%.  Previous evaluation by Dr. Lewayne Bunting with      recommendations for ICD prophylactic implant at this time with the      patient not following up.  2. One episode of wide complex tachycardia during previous      hospitalization.  3. History of ACE inhibitor tolerance felt to be contributing factor      to his cough.  4. History of beta blocker intolerance causing respiratory compromise.  5. History of hypertension.  6. History of PAF.  7. Hyperthyroidism.  8. History of medical noncompliance.  9. Illiteracy and dyslexia.   REVIEW OF SYSTEMS:  As stated above, otherwise negative.   CURRENT MEDICATIONS:  1. Synthroid 0.137 mg daily.  2. Aspirin 81.  3. Lasix 40 b.i.d.  4. Potassium 20 mEq b.i.d.  5. Atacand 4 mg daily.  6. Digoxin 0.125 mg daily.   P.R.N MEDICATION:  Lasix 40 mg.   PHYSICAL EXAMINATION:  VITAL SIGNS:  Weight 231 pounds, blood pressure  110/80 with a heart rate of 77.  GENERAL APPEARANCE:  Crimson is in no acute distress.  NECK:  No signs of jugular vein  distention at 45 degree angle.  LUNGS:  Clear to auscultation bilaterally.  CARDIOVASCULAR:  Exam reveals S1 and S2, occasional early beat.  I do  not hear any S3 or S4.  ABDOMEN:  Obese, soft, nontender, positive bowel sounds.  LOWER EXTREMITIES:  Without clubbing, cyanosis or edema.  NEUROLOGIC:  Alert and oriented x3.   IMPRESSION:  1. Congestive heart failure secondary to nonischemic cardiomyopathy      with ejection fraction currently 10-20%.  2. Palpitations.  The patient with 12-lead EKG today showing frequent      PVCs, right bundle branch block and left anterior fascicular block      (block unchanged from previous EKG).  Mavrick will have blood work      drawn today.  Once again, I have talked with him at length about      sudden cardiac death and his increased risk with ejection fraction      less than 35%.  I have strongly encouraged him to follow up with      Dr. [name] for further consideration of his implantable      cardioverter defibrillator.  I suspect that Arty is starting to      become more symptomatic with his heart failure from the symptoms he      is describing.  I am going to go ahead and increase his digoxin to      0.25 mg daily, increase his Atacand to 8 mg daily and see him back      in four weeks at which time I will plan on starting him on      spironolactone as he is intolerant to beta blockers.  I will touch      base with Tresa Endo, Dr. Lubertha Basque nurse, make her      aware that patient has returned for follow-up today and have her      give him a call to schedule appointment with Dr. [name].      Dorian Pod, ACNP  Electronically Signed      [name], MD, [facility]  Electronically Signed   MB/MedQ  DD: 06/05/2007  DT: 06/05/2007  Job #:  [id number]   cc:   Talmadge Coventry, M.D.

## 2010-06-02 NOTE — Assessment & Plan Note (Signed)
[facility]                          CHRONIC HEART FAILURE NOTE   [name], [name]                          MRN:          [id number]  DATE:09/23/2006                            DOB:          1960-06-07    PRIMARY CARE:  Talmadge Coventry, M.D.   PRIMARY CARDIOLOGIST:  Appears to be Dr. [name], although the  patient has seen several different providers over the last few years.   Needless to say, he is a new patient to my heart failure clinic.  Does  not look like he has been here since April 2007 and he is here today as  a new patient only because he saw his primary care physician for  complaints of a cough and the patient reports that she wanted to do a  chest x-ray and he did not want to do that and apparently, per patient's  report, he was told he needed to followup with cardiology for further  evaluation as it has been an extended period of time since he was  evaluated anyway.   [name] is a rather focused young gentleman who comes in complaining of  a nonproductive cough x2 months.  He states the cough is rather  sporadic.  It is worse with cold air.  He denies any fevers or chills  associated with it.  He states he had some old Lasix at home.  He  decided to treat the cough himself.  He took 20 mg of Lasix for 8 days  and that seemed to relieve it some, but then the cough came right back.  Apparently has had some problems with his thyroid levels, and this is  being followed by his primary care physician and he is pending further  evaluation of this.  He states he stopped taking all of his heart  medications.  He has only taken his Synthroid.  He also has not taken  any anticoagulation therapy, although has a history of paroxysmal atrial  fibrillation.  [name] denies any fluid retention.  He states he goes  to the gym and works out every day.  He has been doing this for about a  month.  He lifts weights and does cardiovascular  aerobic exercise.  He  states he only becomes mildly dyspneic with working out and it is only  with the cardio aerobic exercise.  He is a Curator.  He lives in  [place].  He complains of some episodes of palpitations over the last 2  months but this has occurred both times while he was taking Sudafed to  see if helped with his cough.  Otherwise, he denies any symptoms  suggestive of volume overload.   PAST MEDICAL HISTORY:  Is somewhat sketchy, as he is not a very  consistent patient here in the clinic:  1. Apparently has a history of hypothyroidism.  2. Was a previous participant in the WARCEF study.  3. Has an intolerance to ACE INHIBITORS which causes a cough.  4. Has a history of paroxysmal atrial fibrillation but the patient  states he is taking any medications including aspirin, that he      stopped himself.  5. History of nonischemic cardiomyopathy.  6. Previous history of hyperthyroidism, which was treated.  7. History of hypertension.  8. History of bifascicular block.  9. Most recent echocardiogram done in December 2006 showed an EF of 20-      25% with virtual akinesis of the inferior, posterior, inferoseptal,      and lateral walls.   Per old documentation obtained at [facility], it states that [name] has a history of noncompliance with  medications.  It also mentions a history of ventricular tachycardia, a  history of Grave's disease.  It also states that the patient is  illiterate and is severely dyslexic, and has a hard time grasping and  understanding his medical diagnosis and instructions.  No clear evidence  that the patient has ever had a cardiac catheterization.  However, he  reports having a stress perfusion study done that showed no evidence of  infarct or ischemia per the patient's report.   REVIEW OF SYSTEMS:  As stated above.   CURRENT MEDICATIONS:  Synthroid 137 mcg daily.  The patient states that  is all he is  taking.   A 12-lead EKG shows sinus tachycardia with occasional PVCs, right bundle-  branch block, left anterior fascicular block, with reported history of  bifascicular block per old EKGs.   PHYSICAL EXAMINATION:  Weight 230 pounds, blood pressure 112/78, heart  rate 112.  Repeat pulse 106.  [name] is a 50 year old Caucasian  gentleman in no acute distress.  He has no signs of JVD at 45-degree  angle.  LUNGS:  Clear to auscultation.  I do not hear any wheezing or rhonchi or  crackles.  CARDIOVASCULAR:  Reveals an S1 and S2 with a regular rate and rhythm at  this time.  No S4 heard, no S3.  ABDOMEN:  Soft, nontender, positive bowel sounds, mildly obese.  LOWER EXTREMITIES:  Without clubbing, cyanosis, or edema.  NEUROLOGIC:  He is alert and oriented x3, somewhat flat affect.  SKIN:  Warm and dry.   IMPRESSION:  The patient with no signs of volume overload at this time  in the setting of known cardiomyopathy, which is most likely  nonischemic.  He has not had a stress test in several years.  I do not  see that he has ever had a cardiac catheterization.  Apparently, he has  been noncompliant with medications in the past.  Blood pressure is  controlled today but his heart rate is elevated and he also has a known  history of paroxysmal atrial fibrillation with complaints of  palpitations over the last few months.  I do not feel that I could be of  much benefit to [name] here in the heart failure clinic at this time.  I have asked him to proceed with a chest x-ray for further workup of his  cough.  He has declined the offer of a chest x-ray.  I have asked him to  proceed with a Holter monitor for further evaluation of his  palpitations.  He has refused to proceed with a Holter monitor at this  time.  I have asked him to start taking aspirin 325 mg daily.  He states  that he will do this.  I have also started him on bisoprolol 5 mg daily.  He states he will start the medication.  I  encouraged him to  follow up  with Dr. Smith Mince for reevaluation of his  thyroid function.  Etiology of his cough is unclear at this time.  I do  not suspect that it is from his heart failure, although the patient  refusing further workup for the above health problems.      Dorian Pod, ACNP  Electronically Signed      Bevelyn Buckles. Bensimhon, MD  Electronically Signed   MB/MedQ  DD: 09/26/2006  DT: 09/27/2006  Job #: [id number]   cc:   Talmadge Coventry, M.D.

## 2010-06-02 NOTE — Assessment & Plan Note (Signed)
[facility]                            CARDIOLOGY OFFICE NOTE   Raymond [name], Raymond [name]                          MRN:          [id number]  DATE:11/04/2006                            DOB:          [birth year]    PRIMARY CARE PHYSICIAN:  Talmadge Coventry, M.D.   REASON FOR VISIT:  Evaluate patient with cardiomyopathy.   HISTORY OF PRESENT ILLNESS:  The patient presents back for evaluation of  cough and dyspnea.  He had been away from our office for a few years.  He has come off of all medications though.  Over time, we have titrated  him to reasonable doses of ACE and beta blockers.  He has not been able  to afford these drugs and came off of them.  However, he has had an  increasing cough and was referred back for this.  He has also described  shortness of breath walking a short distance on level ground, class III  symptoms.  He was to get an echocardiogram after seeing our nurse  practitioner in the heart failure clinic.  However, he called his  insurance company and was told that this would cost him $800 and he  canceled this.  He did see one of our pulmonologists who suggested a  sleep study, pulmonary function testing.  Her was considering primary  lung disease, sleep apnea, reflux, sinus drainage.  The patient again  could not afford to comply with any of these suggestions.   He is back today describing the above complaints.  He is not having any  resting shortness of breath.  He is not having any PND or orthopnea.  Of  note, he was started on bisoprolol but said this made his cough even  worse and so he stopped it.  He is fatigued.  He is trying to work two  jobs and says he is very limited in his ability to do anything.   PAST MEDICAL HISTORY:  Nonischemic cardiomyopathy (EF 20-25% by echo in  2006).  The etiology is not clear, though possibly thyrotoxic.  Hyperthyroidism treated with resultant hypothyroidism.  Hypertension,  paroxysmal atrial  fibrillation, medical noncompliance secondary to cost,  illiteracy/dyslexia, previous ventricular tachycardia noted.   PAST SURGICAL HISTORY:  None.   ALLERGIES:  ACE INHIBITOR caused a cough in the past.   MEDICATIONS:  1. Synthroid 137 mcg daily.  2. Aspirin 325 mg daily.  3. Potassium.  4. Furosemide 20 mg b.i.d.   SOCIAL HISTORY:  The patient is divorced.  He repairs cars.  He does not  have children.  He has never smoked cigarettes and does not drink  alcohol.   FAMILY HISTORY:  Contributory for his father dying of heart problems  at age 71.  There is no family history of cardiomyopathy.   REVIEW OF SYSTEMS:  As stated in the HPI and otherwise negative for  other systems.   PHYSICAL EXAMINATION:  GENERAL:  The patient is not in acute distress.  VITAL SIGNS:  Blood pressure 104/86, heart rate 110 and regular, weight  231 pounds.  HEENT:  Eyelids unremarkable.  Pupils equal, round, and reactive to  light.  Fundi not visualized.  Oral mucosa unremarkable.  NECK:  Jugular venous distention of about 10 cm at 45 degrees, carotid  upstroke brisk and symmetric, no bruits, no thyromegaly.  LYMPHATICS:  No cervical, axillary, or inguinal adenopathy.  LUNGS:  Clear to auscultation bilaterally.  BACK:  No costovertebral angle tenderness.  CHEST:  Unremarkable.  HEART:  PMI not displaced or sustained.  S1 and S2 within normal limits.  No S4.  Positive S3.  No murmurs.  ABDOMEN:  Obese, positive bowel sounds normal in frequency and pitch.  No bruits, no rebound, no guarding, no midline pulsatile mass, no  hepatomegaly and no splenomegaly.  SKIN:  No rashes and no nodules.  EXTREMITIES:  2+ pulses throughout.  Trace bilateral lower extremity  edema.  NEUROLOGY:  Oriented to person, place, and time.  Cranial nerves II-XII  grossly intact.  Motor grossly intact.   ASSESSMENT:  1. Heart failure.  The patient has class III heart failure.  I have      offered to put him in the  hospital as I think his symptoms will be      very difficult to treat as an outpatient.  We are hampered by his      inability to pay for any of the medications or diagnostic testing.      He needs an echocardiogram.  He would benefit from a right heart      catheterization before I would consider initiating a beta blocker.      I am going to get a BMET today.  I am going to continue him on the      current dose of diuretic, though we might go up on this.  I am also      going to give him samples of Atacand 4 mg daily.  We are going to      need to see him back very soon, but hopefully he will come into the      hospital.  We will call him early next week to see if he has      reconsidered doing this as he is not wanting to do this today.  I      think this patient is at high risk for poor outcome including need      for emergent hospitalization, worsening heart failure or even death      in the next few months.  If we cannot get him on the necessary      therapies.  2. Atrial fibrillation.  He has had no new palpitations.  No further      therapy is warranted.  3. Cough.  This may well be related to the pulmonary issues suspected      by Dr. Craige Cotta.  However, I suspect this is decompensated heart      failure.  His BNP has not been particularly elevated.  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   FOLLOWUP:  We will call him next week and try to get him into the  hospital.  If not, we will at least see him back in 10-14 days.     [name], MD, [facility]  Electronically Signed    JH/MedQ  DD: 11/04/2006  DT: 11/06/2006  Job #: [id number]   cc:   Talmadge Coventry, M.D.

## 2010-06-02 NOTE — Assessment & Plan Note (Signed)
[facility]                          CHRONIC HEART FAILURE NOTE   Raymond [name], Raymond [name]                          MRN:          [id number]  DATE:10/11/2007                            DOB:          20-Jun-1960    PRIMARY CARDIOLOGIST:  [name], MD, [facility]   ELECTROPHYSIOLOGY:  Doylene Canning. [name], MD   PRIMARY CARE:  Talmadge Coventry, MD   Raymond [name] returns today for further followup of his congestive heart  failure.  Since I last saw Raymond [name] in August, he has decided to apply for  disability, as he is going to need an ICD.  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  He will not be able to  carry out his current job; however, Raymond [name] continues to have class III  symptoms anyway.  Continues to have episodes of palpitations that are  brief.  Denies lightheadedness, dizziness, or syncopal episodes.  Had  complains of ongoing fatigue and decreased energy.  Denies any symptoms  suggestive of volume overload.   PAST MEDICAL HISTORY:  1. 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 Lewayne Bunting with patient declining ICD therapy at this      time.  2. History of wide complex tachycardia with palpitations.  3. History of ACE inhibitor and beta-blocker intolerance.  However,      the patient is tolerating carvedilol.   REVIEW OF SYSTEMS:  As stated above, otherwise negative.   CURRENT MEDICATIONS:  1. Synthroid 0.137.  2. Aspirin 81.  3. KCl 20 b.i.d.  4. Atacand 8 mg.  5. Digoxin 0.25.  6. Xanax p.r.n.  7. Lasix 80 in the morning, 40 in the evening.  8. Carvedilol 9.375 b.i.d.   PHYSICAL EXAMINATION:  VITAL SIGNS:  Weight 225, blood pressure 119/77,  heart rate 86.  GENERAL:  Raymond [name] has no signs of jugular vein distention.  LUNGS:  Clear to auscultation bilaterally.  CARDIOVASCULAR:  S1 and S2.  Regular rate  and rhythm.  ABDOMEN:  Soft, nontender, positive bowel sounds.  LOWER EXTREMITIES:  Without clubbing, cyanosis, or edema.   CLINICAL DATA:  A 12-lead EKG showing sinus rhythm with PVCs,  bifascicular block, unchanged from previous EKG at a rate of 86.   IMPRESSION:  Congestive heart failure secondary to nonischemic  cardiomyopathy with ejection fraction of 10-20% with the patient still  declining implantable cardioverter-defibrillator consideration at this  time.  The patient states he is waiting for his disability to begin  before considering the implantable cardioverter-defibrillator.  I am  going to increase his carvedilol to 12.5 mg b.i.d. as he has been  tolerating his current dose.  I am going to arrange for Raymond [name] to follow  up with Dr. [name] in December.  The patient states he thinks his  disability should be completed or improved by them.  I also planned on  seeing him back in 4-6 weeks for further titration of his beta-blocker.  Dorian Pod, ACNP  Electronically Signed      [name], MD, [facility]  Electronically Signed   MB/MedQ  DD: 10/11/2007  DT: 10/12/2007  Job #: [id number]   cc:   Talmadge Coventry, M.D.

## 2010-06-03 ENCOUNTER — Ambulatory Visit (HOSPITAL_COMMUNITY): Payer: Medicare Other | Attending: Cardiology | Admitting: Radiology

## 2010-06-03 VITALS — Ht 69.0 in | Wt 267.0 lb

## 2010-06-03 DIAGNOSIS — R0602 Shortness of breath: Secondary | ICD-10-CM

## 2010-06-03 DIAGNOSIS — R079 Chest pain, unspecified: Secondary | ICD-10-CM | POA: Insufficient documentation

## 2010-06-03 DIAGNOSIS — I4891 Unspecified atrial fibrillation: Secondary | ICD-10-CM

## 2010-06-03 DIAGNOSIS — I4510 Unspecified right bundle-branch block: Secondary | ICD-10-CM

## 2010-06-03 MED ORDER — TECHNETIUM TC 99M TETROFOSMIN IV KIT
33.0000 | PACK | Freq: Once | INTRAVENOUS | Status: AC | PRN
  Administered 2010-06-03: 33 via INTRAVENOUS

## 2010-06-03 MED ORDER — REGADENOSON 0.4 MG/5ML IV SOLN
0.4000 mg | Freq: Once | INTRAVENOUS | Status: AC
  Administered 2010-06-03: 0.4 mg via INTRAVENOUS

## 2010-06-03 MED ORDER — TECHNETIUM TC 99M TETROFOSMIN IV KIT
11.0000 | PACK | Freq: Once | INTRAVENOUS | Status: AC | PRN
  Administered 2010-06-03: 11 via INTRAVENOUS

## 2010-06-03 NOTE — Progress Notes (Addendum)
[facility] SITE 3 NUCLEAR MED [address] [place] [address] [phone]  Cardiology Nuclear Med Study  Raymond [name]. is a 50 y.o. male [id number] Jun 24, 1960   Nuclear Med Background Indication for Stress Test:  Evaluation for Ischemia History:  05/11Defibrillator, 11/10 Echo: 15% mild MR, 10/08 Heart Catheterization NL and NICM(15%), Chronic AFIB Cardiac Risk Factors: CVA, Family History - CAD, Hypertension, Obesity and RBBB  Symptoms:  Chest Pain   Nuclear Pre-Procedure Caffeine/Decaff Intake:  None NPO After: 8:30am   Lungs:  clear IV 0.9% NS with Angio Cath:  20g  IV Site: R Antecubital  IV Started by:  Stanton Kidney, EMT-P  Chest Size (in):  46 Cup Size: n/a  Height: 5\' 9"  (1.753 m)  Weight:  267 lb (121.11 kg)  BMI:  Body mass index is 39.43 kg/(m^2). Tech Comments:  Beta blocker held this day, per patient.    Nuclear Med Study 1 or 2 day study: 1 day  Stress Test Type:  Eugenie Birks  Reading MD: Arvilla Meres, MD  Order Authorizing Provider:  J.Hochrein  Resting Radionuclide: Technetium 55m Tetrofosmin  Resting Radionuclide Dose: 11 mCi   Stress Radionuclide:  Technetium 83m Tetrofosmin  Stress Radionuclide Dose: 33 mCi           Stress Protocol Rest HR: 69 Stress HR: 97  Rest BP: 99/65 Stress BP: 97/51  Exercise Time (min): n/a METS: n/a   Predicted Max HR: 173 bpm % Max HR: 56.07 bpm Rate Pressure Product: 9603   Dose of Adenosine (mg):  n/a Dose of Lexiscan: 0.4 mg  Dose of Atropine (mg): n/a Dose of Dobutamine: n/a mcg/kg/min (at max HR)  Stress Test Technologist: Milana Na, EMT-P  Nuclear Technologist:  Doyne Keel, CNMT     Rest Procedure:  Myocardial perfusion imaging was performed at rest 45 minutes following the intravenous administration of Technetium 36m Tetrofosmin. Rest ECG: SR with PVCS and A/V Pacing  Stress Procedure:  The patient received IV Lexiscan 0.4 mg over 15-seconds.  Technetium 5m Tetrofosmin  injected at 30-seconds.  There were no significant changes and pacs/pvcs with Lexiscan.  Quantitative spect images were obtained after a 45 minute delay. Stress ECG: No significant change from baseline ECG  QPS Raw Data Images:  Normal; no motion artifact; normal heart/lung ratio.Extensive soft-tissue attenuation. Stress Images:  There is decreased uptake in the inferior wall, distal anterior wall, septum and apex. Rest Images:  There is decreased uptake in the inferior wall, distal anterior wall, septum and apex. Subtraction (SDS):  Mostly fixed defects. Mild ischemia in distal anterior wall and apex. Transient Ischemic Dilatation (Normal <1.22):  1.03 Lung/Heart Ratio (Normal <0.45):  .36  Quantitative Gated Spect Images QGS EDV:  382 ml QGS ESV:  318 ml QGS cine images: LV is severely dilated with severe global HK. QGS EF: 17%  Impression Exercise Capacity:  Adenosine study with no exercise. BP Response:  n/a Clinical Symptoms:  n/a ECG Impression:  No significant ST segment change with adenosine. Comparison with Prior Nuclear Study: No images to compare  Overall Impression:  Abnormal stress nuclear study. Perfusion imaging shows fixed defects in the  inferior wall, distal anterior wall, septum and apex. There is very mild reversibility in distal anterior wall and apex. There is significant soft-tissue attenuation on raw images. EF 17% with severely dilated LV and global HK.    Raymond [name]  No significant ischemia consistent with previous history.  EF very low.  I will schedule follow  up.  Raymond [name]

## 2010-06-04 NOTE — Progress Notes (Signed)
copy routed to Dr. Antoine Poche.Raymond [name]

## 2010-06-05 ENCOUNTER — Telehealth: Payer: Self-pay | Admitting: Cardiology

## 2010-06-05 NOTE — Telephone Encounter (Signed)
06/05/10--5:30pm--dr hochrein--mr drew wants results of stress test and i don't feel comfortable giving this result--i advised i would have you call him early next week--thanks nt

## 2010-06-05 NOTE — Telephone Encounter (Signed)
Pt calling for test results.

## 2010-06-08 NOTE — Telephone Encounter (Signed)
Reviewed results of myoview with pt.  He is aware that Dr Antoine Poche has not reviewed results at this time and that once he does if he orders anything differently - I will call and let him know.  Pt states he is still getting chest pain from time to time.

## 2010-06-10 ENCOUNTER — Ambulatory Visit (INDEPENDENT_AMBULATORY_CARE_PROVIDER_SITE_OTHER): Payer: Medicare Other | Admitting: *Deleted

## 2010-06-10 ENCOUNTER — Telehealth: Payer: Self-pay | Admitting: *Deleted

## 2010-06-10 DIAGNOSIS — I4891 Unspecified atrial fibrillation: Secondary | ICD-10-CM

## 2010-06-10 DIAGNOSIS — I10 Essential (primary) hypertension: Secondary | ICD-10-CM

## 2010-06-10 LAB — POCT INR: INR: 2.4

## 2010-06-10 MED ORDER — ENALAPRIL MALEATE 5 MG PO TABS: 5.0000 mg | ORAL_TABLET | Freq: Two times a day (BID) | ORAL | Status: DC

## 2010-06-10 NOTE — Telephone Encounter (Signed)
Pt here for a coumadin clinic appt.  Requesting refill of Enalapril be sent to [facility] [phone].

## 2010-06-11 ENCOUNTER — Telehealth: Payer: Self-pay | Admitting: Cardiology

## 2010-06-11 NOTE — Telephone Encounter (Signed)
Pt calling re rx for  zanax to help him sleep [facility] [place] [address]

## 2010-06-11 NOTE — Telephone Encounter (Signed)
Spoke with pt about the fact that Dr Antoine Poche doesn't general prescribe Xanax and that he should get this from his primary care MD.  Pt became very upset that "[place] doctors give him this medicine and now we don't want to give it to him"  Pt states his primary care MD quit and the new primary care MD he is assigned to doesn't prescribe this medication.  Pt continued on the say that he was going to stop all of his cardiac medication and just get a fifth of liquor and start drinking instead.  He was yelling and not listening to reason and I explained that his behavior was inappropriate and disconnected the call.

## 2010-06-12 NOTE — Progress Notes (Signed)
Pt aware of results 

## 2010-06-13 ENCOUNTER — Other Ambulatory Visit: Payer: Self-pay | Admitting: *Deleted

## 2010-06-13 DIAGNOSIS — I10 Essential (primary) hypertension: Secondary | ICD-10-CM

## 2010-06-16 ENCOUNTER — Other Ambulatory Visit: Payer: Self-pay | Admitting: Cardiology

## 2010-06-24 ENCOUNTER — Ambulatory Visit (INDEPENDENT_AMBULATORY_CARE_PROVIDER_SITE_OTHER): Payer: Medicare Other | Admitting: *Deleted

## 2010-06-24 ENCOUNTER — Telehealth: Payer: Self-pay | Admitting: Cardiology

## 2010-06-24 DIAGNOSIS — I428 Other cardiomyopathies: Secondary | ICD-10-CM

## 2010-06-24 MED ORDER — METOPROLOL TARTRATE 50 MG PO TABS: ORAL_TABLET | ORAL | Status: DC

## 2010-06-24 NOTE — Progress Notes (Signed)
icd check for research by industry

## 2010-06-24 NOTE — Telephone Encounter (Signed)
This has been taken care of spoke with pt and told him the med should be at the pharmacy

## 2010-06-24 NOTE — Telephone Encounter (Signed)
Pt's mom calling back re refill, pt out and needs refill asap

## 2010-06-24 NOTE — Telephone Encounter (Signed)
PT NEEDS METOPROLOL TO BE CALL IN TO [facility] # [phone]

## 2010-06-24 NOTE — Telephone Encounter (Signed)
Addended by: Kem Parkinson on: 06/24/2010 02:45 PM   Modules accepted: Orders

## 2010-06-24 NOTE — Telephone Encounter (Signed)
Pt called and stated that the pharmacy has been trying to get Metoprolol filled since Sunday, with no reply.  Pt states he goes thru this every time he tries to get medication.  Please check to see if this has been sent.

## 2010-07-08 ENCOUNTER — Ambulatory Visit (INDEPENDENT_AMBULATORY_CARE_PROVIDER_SITE_OTHER): Payer: Medicare Other | Admitting: *Deleted

## 2010-07-08 DIAGNOSIS — I4891 Unspecified atrial fibrillation: Secondary | ICD-10-CM

## 2010-07-14 ENCOUNTER — Encounter: Payer: Self-pay | Admitting: Internal Medicine

## 2010-07-14 ENCOUNTER — Ambulatory Visit (INDEPENDENT_AMBULATORY_CARE_PROVIDER_SITE_OTHER): Payer: Medicare Other | Admitting: Internal Medicine

## 2010-07-14 ENCOUNTER — Ambulatory Visit (INDEPENDENT_AMBULATORY_CARE_PROVIDER_SITE_OTHER): Payer: Medicare Other | Admitting: *Deleted

## 2010-07-14 VITALS — BP 115/77 | HR 72 | Resp 18 | Ht 69.0 in | Wt 268.0 lb

## 2010-07-14 DIAGNOSIS — I4891 Unspecified atrial fibrillation: Secondary | ICD-10-CM

## 2010-07-14 DIAGNOSIS — I428 Other cardiomyopathies: Secondary | ICD-10-CM

## 2010-07-14 LAB — POCT INR: INR: 1.5

## 2010-07-14 MED ORDER — AMIODARONE HCL 200 MG PO TABS: ORAL_TABLET | ORAL | Status: DC

## 2010-07-14 NOTE — Patient Instructions (Signed)
Your physician has recommended you make the following change in your medication:  1) Increase amiodarone to 200mg  two tablets by mouth once daily.  Your physician recommends that you have lab work today: bmp/liver/digoxin/tsh (427.31).  Your physician wants you to follow-up in: 9 months (March 2013). You will receive a reminder letter in the mail two months in advance. If you don't receive a letter, please call our office to schedule the follow-up appointment.  Please keep your followup appointment with Dr. Durene Fruits on Tues 09/01/10 @ 11:30am.

## 2010-07-14 NOTE — Assessment & Plan Note (Signed)
He has had recurrent atrial fibrillation since last fall resulting in inappropriate ICD discharge. Unfortunately shock therapy restored an atrial tachycardia which is gradually and subsequently resolved. We'll increase his amiodarone today. We will measure his amiodarone surveillance laboratories and check his digoxin level.  In the event that he has recurrent problems with rapid atrial fibrillation, I would have a very low threshold for undertaking AV junction ablation. Given his cardiomyopathy I do not think he is a candidate even for convergent ablation and and with his large left atrium is not candidate for standard pulmonary vein isolation

## 2010-07-14 NOTE — Assessment & Plan Note (Signed)
The patient's device was interrogated.  The information was reviewed. No changes were made in the programming.    

## 2010-07-14 NOTE — Progress Notes (Signed)
HPI  Raymond [name]. is a 50 y.o. male Seen in Followup for congestive heart failure in the setting of nonischemic cardiomyopathy for which he is status post CRT-D implantation.  He also has a history of atrial fibrillation for which he was cardioverted following loading with amiodarone. He has a history of a prior stroke  The patient was noted to have an elevated TSH on amiodarone therapy; his Synthroid was increased  He describes an ICD discharge about 3 weeks ago. He was in the shower. He didn't having some antecedent problems with dyspnea.  He has ongoing shortness of breath limited to less than 100 yards. He has not had nocturnal dyspnea or peripheral edema.  He had a recent visit with Tereso Newcomer because of chest pain. He underwent Myoview scanning which demonstrated   Ejection fraction of 17% with mild ischemia felt to be relatively low risk given his known nonischemic cardiomyopathy    Past Medical History  Diagnosis Date  . Systolic CHF, chronic   . Non-ischemic cardiomyopathy     echo 11/10: EF 15%, mild MR, mod LAE, mild dec. RVSF, PASP;   cath 10/08: normal cors  . Chronic atrial fibrillation     ON COUMADIN  . Hypertension   . Hypothyroidism     s/p RAI therapy  . Obesity   . Non-compliance   . Dyslexia   . 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 RBBB (right bundle branch block)   . Palpitations     W/WIDE COMPLEX TACHYCARDIA IN THE PAST, REFUSED REPEATED OFFERS OF AN ICD  . 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 Insert / replace / remove pacemaker 06/11/09    [employer] [id number] BIVENTRICULAR AICD SERIAL [phone]  . Knee surgery     LEFT X2  . Cardiac catheterization     Current Outpatient Prescriptions  Medication Sig Dispense Refill  . acetaminophen (TYLENOL) 325 MG tablet Take 650 mg by mouth every 4  (four) hours as needed.        . ALPRAZolam (XANAX) 0.25 MG tablet Take 0.25 mg by mouth at bedtime as needed.        Marland Kitchen amiodarone (PACERONE) 200 MG tablet Take 200 mg by mouth daily.        . digoxin (LANOXIN) 0.125 MG tablet Take 125 mcg by mouth daily.        . enalapril (VASOTEC) 5 MG tablet Take 1 tablet (5 mg total) by mouth 2 (two) times daily.  60 tablet  6  . furosemide (LASIX) 80 MG tablet Take 80 mg by mouth as needed.       Marland Kitchen levothyroxine (SYNTHROID, LEVOTHROID) 200 MCG tablet Take 200 mcg by mouth daily.        . metoprolol (LOPRESSOR) 50 MG tablet TAKE 1 AND 1/2 TABLETS TWICE DAILY  60 tablet  12  . pantoprazole (PROTONIX) 40 MG tablet Take 40 mg by mouth as needed.       . potassium chloride SA (K-DUR,KLOR-CON) 20 MEQ tablet Take 20 mEq by mouth daily.        Marland Kitchen spironolactone (ALDACTONE) 25 MG tablet Take 25 mg by mouth daily.        Marland Kitchen warfarin (COUMADIN) 7.5 MG tablet Take as directed by Anticoagulation clinic  50 tablet  3  . DISCONTD: levothyroxine (SYNTHROID,  LEVOTHROID) 25 MCG tablet Take 75 mcg by mouth daily.         No Known Allergies  Review of Systems negative except from HPI and PMH  Physical Exam Well developed and well nourished in no acute distress The complaining of shortness of breath with exertion HENT normal E scleral and icterus clear Neck Supple JVP flat; carotids brisk and full Clear to ausculation Regular rate and rhythm, no murmurs gallops or rub Soft with active bowel sounds No clubbing cyanosis and edema Alert and oriented, grossly normal motor and sensory function Skin Warm and Dry     Assessment and  Plan

## 2010-07-14 NOTE — Assessment & Plan Note (Signed)
His atrial fibrillation contributed to his deteriorating functional status. He is back in regular rhythm. I'm not sure there are great options further for him. I think it may be worth undertaking in AV optimization echo to see if there is anything we can do to augment his resynchronization; I am not all that optimistic given his Right bundler branch block as the underlying conduction abnormality.

## 2010-07-14 NOTE — Assessment & Plan Note (Signed)
Continue current medications. 

## 2010-07-15 LAB — BASIC METABOLIC PANEL
BUN: 21 mg/dL (ref 6–23)
CO2: 22 mEq/L (ref 19–32)
Chloride: 106 mEq/L (ref 96–112)
Creatinine, Ser: 1 mg/dL (ref 0.4–1.5)
Potassium: 4.7 mEq/L (ref 3.5–5.1)

## 2010-07-15 LAB — HEPATIC FUNCTION PANEL
ALT: 25 U/L (ref 0–53)
AST: 23 U/L (ref 0–37)
Bilirubin, Direct: 0.2 mg/dL (ref 0.0–0.3)
Total Bilirubin: 0.4 mg/dL (ref 0.3–1.2)
Total Protein: 7.1 g/dL (ref 6.0–8.3)

## 2010-07-15 LAB — DIGOXIN LEVEL: Digoxin Level: 0.2 ng/mL — ABNORMAL LOW (ref 0.8–2.0)

## 2010-07-16 ENCOUNTER — Telehealth: Payer: Self-pay | Admitting: Internal Medicine

## 2010-07-16 DIAGNOSIS — E0590 Thyrotoxicosis, unspecified without thyrotoxic crisis or storm: Secondary | ICD-10-CM

## 2010-07-16 MED ORDER — LEVOTHYROXINE SODIUM 150 MCG PO TABS: 150.0000 ug | ORAL_TABLET | Freq: Every day | ORAL | Status: DC

## 2010-07-16 NOTE — Telephone Encounter (Signed)
Pt was told to increase pacerone  200mg  bid but he was already taking 200mg  dose Dr. Graciela Husbands want him to increase it more

## 2010-07-16 NOTE — Telephone Encounter (Signed)
Pt calling pacerone 200mg  already wants to know if he's to take 400mg 

## 2010-07-16 NOTE — Telephone Encounter (Signed)
Will forward to Dr. Klein. 

## 2010-07-16 NOTE — Telephone Encounter (Signed)
Patient called to make sure he is to take 2/ 200 mg Pacerone medication . Pt. Was seen by Dr. Graciela Husbands on 07/14/10. According to MD's notes pt is to take 200 mg Pacerone 2 tablets once a day. Pt. Aware. He verbalized understanding.

## 2010-07-16 NOTE — Telephone Encounter (Signed)
I spoke with the patient. It sounds as though he was taking amiodarone 100mg  two tablets daily or a total of 200mg  a day. He states Dr. Graciela Husbands wanted to up this, and I explained that was correct, he should be taking 200mg  two tablets once daily to total 400mg  daily. He states he will start this tonight.  He is also aware of his lab results. He verbalizes understanding to decrease his thyroid medicine to a day. I will call this in to [facility] in [place]. He will come around 7/26 for a repeat TSH.

## 2010-07-28 ENCOUNTER — Encounter: Payer: Medicare Other | Admitting: *Deleted

## 2010-07-29 ENCOUNTER — Ambulatory Visit (INDEPENDENT_AMBULATORY_CARE_PROVIDER_SITE_OTHER): Payer: Medicare Other | Admitting: *Deleted

## 2010-07-29 DIAGNOSIS — I4891 Unspecified atrial fibrillation: Secondary | ICD-10-CM

## 2010-08-12 ENCOUNTER — Ambulatory Visit (INDEPENDENT_AMBULATORY_CARE_PROVIDER_SITE_OTHER): Payer: Medicare Other | Admitting: *Deleted

## 2010-08-12 ENCOUNTER — Other Ambulatory Visit (INDEPENDENT_AMBULATORY_CARE_PROVIDER_SITE_OTHER): Payer: Medicare Other | Admitting: *Deleted

## 2010-08-12 DIAGNOSIS — E039 Hypothyroidism, unspecified: Secondary | ICD-10-CM

## 2010-08-12 DIAGNOSIS — I4891 Unspecified atrial fibrillation: Secondary | ICD-10-CM

## 2010-08-12 LAB — TSH: TSH: 4.77 u[IU]/mL (ref 0.35–5.50)

## 2010-08-13 ENCOUNTER — Other Ambulatory Visit: Payer: Medicare Other | Admitting: *Deleted

## 2010-08-26 ENCOUNTER — Ambulatory Visit (INDEPENDENT_AMBULATORY_CARE_PROVIDER_SITE_OTHER): Payer: Medicare Other | Admitting: *Deleted

## 2010-08-26 DIAGNOSIS — I4891 Unspecified atrial fibrillation: Secondary | ICD-10-CM

## 2010-09-01 ENCOUNTER — Encounter: Payer: Self-pay | Admitting: Cardiology

## 2010-09-01 ENCOUNTER — Ambulatory Visit (INDEPENDENT_AMBULATORY_CARE_PROVIDER_SITE_OTHER): Payer: Medicare Other | Admitting: Cardiology

## 2010-09-01 DIAGNOSIS — I4891 Unspecified atrial fibrillation: Secondary | ICD-10-CM

## 2010-09-01 DIAGNOSIS — I509 Heart failure, unspecified: Secondary | ICD-10-CM

## 2010-09-01 DIAGNOSIS — E669 Obesity, unspecified: Secondary | ICD-10-CM

## 2010-09-01 DIAGNOSIS — I5022 Chronic systolic (congestive) heart failure: Secondary | ICD-10-CM

## 2010-09-01 NOTE — Assessment & Plan Note (Signed)
He seems to be euvolemic.  At this point, no change in therapy is indicated.  We have reviewed salt and fluid restrictions.  No further cardiovascular testing is indicated.  His BP will not allow up titration of his meds.

## 2010-09-01 NOTE — Assessment & Plan Note (Signed)
He wants me to look into prescribing one of the new weight loss drugs (Osymia/Belvy).  I have not used these yet and I will research these.

## 2010-09-01 NOTE — Progress Notes (Signed)
HPI The patient returns for followup of his cardiomyopathy. He saw Dr. Graciela Husbands recently after firing of his defibrillator. This was for an atrial tachycardia. His amiodarone was increased.  Since that time he has had no further firings of his defibrillator. He has had no tachycardia palpitations.  He denies any new shortness of breath, PND or orthopnea. He's had no chest pressure, neck or arm discomfort. He said some recent back pain and this has limited him. He ascribes this to weight gain that he has had since his stroke.  No Known Allergies  Current Outpatient Prescriptions  Medication Sig Dispense Refill  . acetaminophen (TYLENOL) 325 MG tablet Take 650 mg by mouth every 4 (four) hours as needed.        . ALPRAZolam (XANAX) 0.25 MG tablet Take 0.25 mg by mouth at bedtime as needed.        Marland Kitchen amiodarone (PACERONE) 200 MG tablet Take two tablets by mouth once daily  60 tablet  11  . cyclobenzaprine (FLEXERIL) 10 MG tablet Take 10 mg by mouth as needed.        . digoxin (LANOXIN) 0.125 MG tablet Take 125 mcg by mouth daily.        . enalapril (VASOTEC) 5 MG tablet Take 1 tablet (5 mg total) by mouth 2 (two) times daily.  60 tablet  6  . furosemide (LASIX) 80 MG tablet Take 80 mg by mouth as needed.       Marland Kitchen levothyroxine (SYNTHROID, LEVOTHROID) 150 MCG tablet Take 1 tablet (150 mcg total) by mouth daily.  30 tablet  6  . metoprolol (LOPRESSOR) 50 MG tablet TAKE 1 AND 1/2 TABLETS TWICE DAILY  60 tablet  12  . pantoprazole (PROTONIX) 40 MG tablet Take 40 mg by mouth as needed.       Marland Kitchen spironolactone (ALDACTONE) 25 MG tablet Take 25 mg by mouth daily.        Marland Kitchen warfarin (COUMADIN) 7.5 MG tablet Take as directed by Anticoagulation clinic  50 tablet  3    Past Medical History  Diagnosis Date  . Systolic CHF, chronic   . Non-ischemic cardiomyopathy     echo 11/10: EF 15%, mild MR, mod LAE, mild dec. RVSF, PASP;   cath 10/08: normal cors  . Chronic atrial fibrillation     ON COUMADIN  . Hypertension    . Hypothyroidism     s/p RAI therapy  . Obesity   . Non-compliance   . Dyslexia   . 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 RBBB (right bundle branch block)   . Palpitations     W/WIDE COMPLEX TACHYCARDIA IN THE PAST, REFUSED REPEATED OFFERS OF AN ICD  . Biventricular ICD  [facility]]     Past Surgical History  Procedure Date  . Insert / replace / remove pacemaker 06/11/09    [employer] [id number] BIVENTRICULAR AICD SERIAL [phone]  . Knee surgery     LEFT X2  . Cardiac catheterization     ROS:  [place], back pain.  Otherwise as stated in the HPI and negative for all other systems.  PHYSICAL EXAM BP 102/66  Pulse 82  Resp 16  Ht 5\' 9"  (1.753 m)  Wt 272 lb (123.378 kg)  BMI 40.17 kg/m2 GENERAL:  Well appearing HEENT:  Pupils equal round and reactive, fundi not visualized, oral mucosa unremarkable NECK:  No jugular venous  distention, waveform within normal limits, carotid upstroke brisk and symmetric, no bruits, no thyromegaly LYMPHATICS:  No cervical, inguinal adenopathy LUNGS:  Clear to auscultation bilaterally BACK:  No CVA tenderness CHEST:  ICD pocket HEART:  PMI not displaced or sustained,S1 and S2 within normal limits, no S3, no S4, no clicks, no rubs, no murmurs ABD:  Flat, positive bowel sounds normal in frequency in pitch, no bruits, no rebound, no guarding, no midline pulsatile mass, no hepatomegaly, no splenomegaly EXT:  2 plus pulses throughout, no edema, no cyanosis no clubbing SKIN:  No rashes no nodules NEURO:  Cranial nerves II through XII grossly intact, motor right hemiparesis PSYCH:  Cognitively intact, oriented to person place and time  ASSESSMENT AND PLAN

## 2010-09-01 NOTE — Patient Instructions (Signed)
Follow up with Dr Antoine Poche in 4 months  The current medical regimen is effective;  continue present plan and medications.

## 2010-09-01 NOTE — Assessment & Plan Note (Signed)
He has had no symptomatic palpitations.  At this point I will not change his. I do note his low dig level but this was prior to the increased dose of amiodarone. I will continue meds as listed and he will have surveillance amiodarone labs.

## 2010-09-07 ENCOUNTER — Encounter: Payer: Medicare Other | Admitting: *Deleted

## 2010-09-09 ENCOUNTER — Ambulatory Visit (INDEPENDENT_AMBULATORY_CARE_PROVIDER_SITE_OTHER): Payer: Medicare Other | Admitting: *Deleted

## 2010-09-09 DIAGNOSIS — I4891 Unspecified atrial fibrillation: Secondary | ICD-10-CM

## 2010-09-10 ENCOUNTER — Telehealth: Payer: Self-pay | Admitting: Cardiology

## 2010-09-10 NOTE — Telephone Encounter (Signed)
WILL FORWARD TO TRIAGE NURSE TO BE SCHEDULED.

## 2010-09-10 NOTE — Telephone Encounter (Signed)
While walking to garage pt reports his hands started shaking and he started breathing heavily and passed out.  Mother reports that he was "out" for about 10 mins.  Pt states this has occurred 3 other times and he went to ED to be checked and "they didn't do nothing"  Pt does have an ICD but reports he did not get shocked - "I know what that feels like."  Pt has not eaten anything today because he is not hungry.   Pt refused ED for eval.  Will discuss with MD and call pt back with further recommendations/orders.

## 2010-09-10 NOTE — Telephone Encounter (Signed)
Come in for nurse visit with orthostatic BPs and defibrillator check.

## 2010-09-10 NOTE — Telephone Encounter (Signed)
Pt passed out this am for 10 mins pt refuses to go to ER

## 2010-09-11 ENCOUNTER — Ambulatory Visit (INDEPENDENT_AMBULATORY_CARE_PROVIDER_SITE_OTHER): Payer: Medicare Other | Admitting: *Deleted

## 2010-09-11 ENCOUNTER — Ambulatory Visit (INDEPENDENT_AMBULATORY_CARE_PROVIDER_SITE_OTHER): Payer: Medicare Other

## 2010-09-11 ENCOUNTER — Encounter: Payer: Self-pay | Admitting: Internal Medicine

## 2010-09-11 DIAGNOSIS — I428 Other cardiomyopathies: Secondary | ICD-10-CM

## 2010-09-11 DIAGNOSIS — I498 Other specified cardiac arrhythmias: Secondary | ICD-10-CM

## 2010-09-11 DIAGNOSIS — I471 Supraventricular tachycardia: Secondary | ICD-10-CM

## 2010-09-11 DIAGNOSIS — R55 Syncope and collapse: Secondary | ICD-10-CM

## 2010-09-11 LAB — ICD DEVICE OBSERVATION
AL IMPEDENCE ICD: 437.5 Ohm
ATRIAL PACING ICD: 48 pct
BAMS-0001: 170 {beats}/min
BAMS-0003: 70 {beats}/min
LV LEAD IMPEDENCE ICD: 937.5 Ohm
LV LEAD THRESHOLD: 0.75 V
TOT-0006: 20110523000000
TOT-0007: 1
TOT-0008: 0
TZAT-0004FASTVT: 8
TZAT-0013FASTVT: 2
TZAT-0018FASTVT: NEGATIVE
TZON-0003FASTVT: 300 ms
TZON-0003SLOWVT: 350 ms
TZON-0004SLOWVT: 40
TZON-0005SLOWVT: 6
TZST-0001FASTVT: 3
TZST-0001FASTVT: 5
TZST-0003FASTVT: 36 J
TZST-0003FASTVT: 40 J
VENTRICULAR PACING ICD: 98 pct
VF: 0

## 2010-09-11 LAB — BASIC METABOLIC PANEL
BUN: 21 mg/dL (ref 6–23)
Calcium: 9.1 mg/dL (ref 8.4–10.5)
GFR: 58.96 mL/min — ABNORMAL LOW (ref >60.00)
Glucose, Bld: 113 mg/dL — ABNORMAL HIGH (ref 70–99)
Sodium: 138 mEq/L (ref 135–145)

## 2010-09-11 LAB — CBC WITH DIFFERENTIAL/PLATELET
Basophils Absolute: 0 10*3/uL (ref 0.0–0.1)
Hemoglobin: 14.7 g/dL (ref 13.0–17.0)
Lymphocytes Relative: 13.1 % (ref 12.0–46.0)
Monocytes Relative: 7.6 % (ref 3.0–12.0)
Platelets: 244 10*3/uL (ref 150.0–400.0)
RDW: 14.5 % (ref 11.5–14.6)
WBC: 8.8 10*3/uL (ref 4.5–10.5)

## 2010-09-11 LAB — T4, FREE: Free T4: 0.77 ng/dL (ref 0.60–1.60)

## 2010-09-11 LAB — MAGNESIUM: Magnesium: 2.1 mg/dL (ref 1.5–2.5)

## 2010-09-11 LAB — TSH: TSH: 7.54 u[IU]/mL — ABNORMAL HIGH (ref 0.35–5.50)

## 2010-09-11 NOTE — Telephone Encounter (Signed)
Spoke with pt, he will come to the office today for bp check and defib check Deliah Goody

## 2010-09-11 NOTE — Patient Instructions (Signed)
Your physician recommends that you schedule a follow-up appointment in: with dr hochrein as scheduled  Your physician has requested that you have an echocardiogram. Echocardiography is a painless test that uses sound waves to create images of your heart. It provides your doctor with information about the size and shape of your heart and how well your heart's chambers and valves are working. This procedure takes approximately one hour. There are no restrictions for this procedure.   Your physician recommends that you return for lab work in: today

## 2010-09-11 NOTE — Progress Notes (Signed)
Pt here for orthostatics and defib check as dr hochrein requested. Pt reports passing out yesterday, he states he walked out to the garage, his hand started to tremor, then his head started shaking and he knew he was going to pass out. He denies chest pain or dizziness prior he states his breathing gets hard and he pants. His wife reports him being out for about 10 min, no loss of bowel or bladder. While he was out his head cont to shake. When he woke he was still panting to breath but after about one to two minutes was able to get up and go inside. He feels weak today. Orthostatics as in chart, when the pt was standing he could feel his heart speed up and it became irregular. Ekg was regular. defib check with no episodes. Information reviewed with dr bensimhon. Pt will have labs today and will be scheduled for an echo. Deliah Goody

## 2010-09-11 NOTE — Progress Notes (Signed)
5 minute standing bp=109/77 with pulse 50 Raymond [name]

## 2010-09-11 NOTE — Progress Notes (Signed)
ICD check 

## 2010-09-18 ENCOUNTER — Ambulatory Visit (HOSPITAL_COMMUNITY): Payer: Medicare Other | Attending: Internal Medicine | Admitting: Radiology

## 2010-09-18 ENCOUNTER — Ambulatory Visit (INDEPENDENT_AMBULATORY_CARE_PROVIDER_SITE_OTHER): Payer: Medicare Other | Admitting: *Deleted

## 2010-09-18 DIAGNOSIS — I079 Rheumatic tricuspid valve disease, unspecified: Secondary | ICD-10-CM | POA: Insufficient documentation

## 2010-09-18 DIAGNOSIS — I10 Essential (primary) hypertension: Secondary | ICD-10-CM | POA: Insufficient documentation

## 2010-09-18 DIAGNOSIS — I4891 Unspecified atrial fibrillation: Secondary | ICD-10-CM

## 2010-09-18 DIAGNOSIS — R55 Syncope and collapse: Secondary | ICD-10-CM

## 2010-09-18 DIAGNOSIS — I059 Rheumatic mitral valve disease, unspecified: Secondary | ICD-10-CM | POA: Insufficient documentation

## 2010-09-18 DIAGNOSIS — I379 Nonrheumatic pulmonary valve disorder, unspecified: Secondary | ICD-10-CM | POA: Insufficient documentation

## 2010-09-18 DIAGNOSIS — I5022 Chronic systolic (congestive) heart failure: Secondary | ICD-10-CM | POA: Insufficient documentation

## 2010-09-18 LAB — POCT INR: INR: 5.6

## 2010-09-23 ENCOUNTER — Telehealth: Payer: Self-pay | Admitting: Cardiology

## 2010-09-23 ENCOUNTER — Encounter: Payer: Medicare Other | Admitting: *Deleted

## 2010-09-23 NOTE — Telephone Encounter (Signed)
Pt calling wanting to know if the results of pt ECHO are back. Please return pt call to discuss further.

## 2010-09-24 NOTE — Telephone Encounter (Signed)
Pt aware of results of echo 

## 2010-09-30 ENCOUNTER — Ambulatory Visit (INDEPENDENT_AMBULATORY_CARE_PROVIDER_SITE_OTHER): Payer: Medicare Other | Admitting: *Deleted

## 2010-09-30 DIAGNOSIS — I4891 Unspecified atrial fibrillation: Secondary | ICD-10-CM

## 2010-09-30 LAB — POCT INR: INR: 2.4

## 2010-10-21 ENCOUNTER — Ambulatory Visit (INDEPENDENT_AMBULATORY_CARE_PROVIDER_SITE_OTHER): Payer: Medicare Other | Admitting: *Deleted

## 2010-10-21 DIAGNOSIS — I4891 Unspecified atrial fibrillation: Secondary | ICD-10-CM

## 2010-10-23 ENCOUNTER — Ambulatory Visit (HOSPITAL_COMMUNITY)
Admission: RE | Admit: 2010-10-23 | Discharge: 2010-10-23 | Disposition: A | Discharge status: Home / Self Care | Payer: Medicare Other | Source: Ambulatory Visit | Attending: Family Medicine | Admitting: Family Medicine

## 2010-10-23 DIAGNOSIS — R5383 Other fatigue: Secondary | ICD-10-CM | POA: Insufficient documentation

## 2010-10-23 DIAGNOSIS — Z950 Presence of cardiac pacemaker: Secondary | ICD-10-CM | POA: Insufficient documentation

## 2010-10-23 DIAGNOSIS — R569 Unspecified convulsions: Secondary | ICD-10-CM

## 2010-10-23 DIAGNOSIS — R55 Syncope and collapse: Secondary | ICD-10-CM | POA: Insufficient documentation

## 2010-10-23 DIAGNOSIS — R5381 Other malaise: Secondary | ICD-10-CM | POA: Insufficient documentation

## 2010-10-23 DIAGNOSIS — I69998 Other sequelae following unspecified cerebrovascular disease: Secondary | ICD-10-CM | POA: Insufficient documentation

## 2010-10-23 NOTE — Procedures (Signed)
EEG NUMBER:  [id number].  This routine EEG was requested in this 50 year old man who has a history of syncopal episodes for the last year.  He states he has no warning. He has been noted to have head-shaking during the events.  He has a history of stroke, leaving him with left-sided weakness, and the patient also is noted to have a pacemaker.  The EEG was done with the patient awake.  During periods of maximal wakefulness, he had a 9-10 cycle posterior dominant rhythm that attenuated with eye-opening and was symmetric.  Background activities were composed mainly of alpha activities that were symmetric along with frontally dominant beta activities.  Photic stimulation produced a symmetric driving response.  The patient was not hyperventilated.  The patient did not become drowsy or fall asleep.  CLINICALINTERPRETATION:  This routine electroencephalogram done with the patient awake is normal.  A normal electroencephalogram does not exclude the diagnosis of epilepsy.          ______________________________ Denton Meek, MD    [id number] D:  10/23/2010 17:03:23  T:  10/23/2010 17:17:56  Job #:  [id number]  cc:   Maurice March, M.D. Fax: [phone]

## 2010-10-28 LAB — BASIC METABOLIC PANEL
CO2: 23
CO2: 26
Calcium: 9.1
Calcium: 9.1
GFR calc Af Amer: >60
GFR calc Af Amer: >60
GFR calc non Af Amer: >60
GFR calc non Af Amer: >60
Glucose, Bld: 100 — ABNORMAL HIGH
Potassium: 3.9
Potassium: 4.4
Sodium: 137
Sodium: 138
Sodium: 139

## 2010-10-28 LAB — POCT I-STAT 3, VENOUS BLOOD GAS (G3P V)
Bicarbonate: 24
Bicarbonate: 25.4 — ABNORMAL HIGH
Bicarbonate: 25.8 — ABNORMAL HIGH
Bicarbonate: 25.9 — ABNORMAL HIGH
Bicarbonate: 27.5 — ABNORMAL HIGH
O2 Saturation: 68
O2 Saturation: 72
O2 Saturation: 77
Operator id: 222701
Operator id: 222701
Operator id: 222701
TCO2: 27
TCO2: 27
pCO2, Ven: 34.7 — ABNORMAL LOW
pCO2, Ven: 35.2 — ABNORMAL LOW
pCO2, Ven: 35.9 — ABNORMAL LOW
pCO2, Ven: 36.8 — ABNORMAL LOW
pCO2, Ven: 37.3 — ABNORMAL LOW
pH, Ven: 7.428 — ABNORMAL HIGH
pH, Ven: 7.45 — ABNORMAL HIGH
pH, Ven: 7.465 — ABNORMAL HIGH
pH, Ven: 7.467 — ABNORMAL HIGH
pO2, Ven: 32
pO2, Ven: 33
pO2, Ven: 34
pO2, Ven: 34
pO2, Ven: 36
pO2, Ven: 39

## 2010-10-28 LAB — POCT I-STAT 3, ART BLOOD GAS (G3+)
O2 Saturation: 94
TCO2: 25
pCO2 arterial: 31.6 — ABNORMAL LOW
pH, Arterial: 7.491 — ABNORMAL HIGH

## 2010-10-28 LAB — APTT: aPTT: 34

## 2010-10-28 LAB — CBC
Hemoglobin: 14.1
MCHC: 33.2
RBC: 4.44
RBC: 4.64
RDW: 14.1 — ABNORMAL HIGH
WBC: 9.5

## 2010-10-28 LAB — CARDIAC PANEL(CRET KIN+CKTOT+MB+TROPI)
CK, MB: 2.6
CK, MB: 3.9
Relative Index: 2
Relative Index: 2.1
Total CK: 128
Total CK: 137

## 2010-10-28 LAB — PROTIME-INR: INR: 1

## 2010-10-28 LAB — TSH: TSH: 0.656

## 2010-11-04 ENCOUNTER — Ambulatory Visit (INDEPENDENT_AMBULATORY_CARE_PROVIDER_SITE_OTHER): Payer: Medicare Other | Admitting: *Deleted

## 2010-11-04 DIAGNOSIS — Z7901 Long term (current) use of anticoagulants: Secondary | ICD-10-CM

## 2010-11-04 DIAGNOSIS — I4891 Unspecified atrial fibrillation: Secondary | ICD-10-CM

## 2010-11-04 LAB — POCT INR: INR: 2.1

## 2010-11-18 ENCOUNTER — Ambulatory Visit (INDEPENDENT_AMBULATORY_CARE_PROVIDER_SITE_OTHER): Payer: Medicare Other | Admitting: *Deleted

## 2010-11-18 DIAGNOSIS — I4891 Unspecified atrial fibrillation: Secondary | ICD-10-CM

## 2010-11-18 DIAGNOSIS — Z7901 Long term (current) use of anticoagulants: Secondary | ICD-10-CM

## 2010-11-18 LAB — POCT INR: INR: 4.4

## 2010-11-30 ENCOUNTER — Ambulatory Visit (INDEPENDENT_AMBULATORY_CARE_PROVIDER_SITE_OTHER): Payer: Medicare Other | Admitting: *Deleted

## 2010-11-30 DIAGNOSIS — I4891 Unspecified atrial fibrillation: Secondary | ICD-10-CM

## 2010-11-30 DIAGNOSIS — Z7901 Long term (current) use of anticoagulants: Secondary | ICD-10-CM

## 2010-12-14 ENCOUNTER — Encounter: Payer: Medicare Other | Admitting: *Deleted

## 2010-12-16 ENCOUNTER — Ambulatory Visit (INDEPENDENT_AMBULATORY_CARE_PROVIDER_SITE_OTHER): Payer: Medicare Other | Admitting: *Deleted

## 2010-12-16 ENCOUNTER — Encounter: Payer: Self-pay | Admitting: Internal Medicine

## 2010-12-16 DIAGNOSIS — I4891 Unspecified atrial fibrillation: Secondary | ICD-10-CM

## 2010-12-16 DIAGNOSIS — Z7901 Long term (current) use of anticoagulants: Secondary | ICD-10-CM

## 2010-12-16 DIAGNOSIS — I428 Other cardiomyopathies: Secondary | ICD-10-CM

## 2010-12-16 LAB — ICD DEVICE OBSERVATION
AL IMPEDENCE ICD: 425 Ohm
AL THRESHOLD: 0.5 V
BAMS-0001: 170 {beats}/min
BAMS-0003: 70 {beats}/min
CHARGE TIME: 10.5 s
MODE SWITCH EPISODES: 2
RV LEAD AMPLITUDE: 11 mv
TOT-0006: 20110523000000
TOT-0008: 0
TOT-0009: 1
TOT-0010: 6
TZAT-0001FASTVT: 1
TZAT-0004FASTVT: 8
TZAT-0012FASTVT: 200 ms
TZAT-0018FASTVT: NEGATIVE
TZAT-0019FASTVT: 7.5 V
TZAT-0020FASTVT: 1 ms
TZON-0003FASTVT: 300 ms
TZON-0003SLOWVT: 350 ms
TZON-0004FASTVT: 30
TZON-0004SLOWVT: 40
TZON-0005FASTVT: 6
TZON-0005SLOWVT: 6
TZON-0010FASTVT: 80 ms
TZST-0001FASTVT: 3
TZST-0001FASTVT: 4
TZST-0003FASTVT: 40 J
VENTRICULAR PACING ICD: 99.77 pct

## 2010-12-16 NOTE — Progress Notes (Signed)
ICD check by industry for research 

## 2010-12-28 ENCOUNTER — Other Ambulatory Visit: Payer: Self-pay | Admitting: Cardiology

## 2010-12-30 ENCOUNTER — Telehealth: Payer: Self-pay | Admitting: Cardiology

## 2010-12-30 NOTE — Telephone Encounter (Signed)
[facility] called to verify dosage of digoxin.. States refill they got yesterday was .25 mg but when look at meds it was sent in as 0.125 mg. Advised that dosage should be Digoxin 0.125 mg. She will refill

## 2010-12-30 NOTE — Telephone Encounter (Signed)
They have a question digoxion and they need the correct dose called in today pt is out

## 2011-01-05 ENCOUNTER — Ambulatory Visit: Payer: Medicare Other | Admitting: Cardiology

## 2011-01-05 ENCOUNTER — Encounter: Payer: Medicare Other | Admitting: *Deleted

## 2011-01-06 ENCOUNTER — Encounter: Payer: Self-pay | Admitting: Cardiology

## 2011-01-07 ENCOUNTER — Encounter: Payer: Self-pay | Admitting: Cardiology

## 2011-01-07 ENCOUNTER — Ambulatory Visit (INDEPENDENT_AMBULATORY_CARE_PROVIDER_SITE_OTHER): Payer: Medicare Other | Admitting: *Deleted

## 2011-01-07 ENCOUNTER — Ambulatory Visit (INDEPENDENT_AMBULATORY_CARE_PROVIDER_SITE_OTHER): Payer: Medicare Other | Admitting: Cardiology

## 2011-01-07 VITALS — BP 108/72 | HR 60 | Resp 16 | Ht 69.0 in | Wt 279.0 lb

## 2011-01-07 DIAGNOSIS — E0590 Thyrotoxicosis, unspecified without thyrotoxic crisis or storm: Secondary | ICD-10-CM

## 2011-01-07 DIAGNOSIS — I4891 Unspecified atrial fibrillation: Secondary | ICD-10-CM

## 2011-01-07 DIAGNOSIS — Z95810 Presence of automatic (implantable) cardiac defibrillator: Secondary | ICD-10-CM

## 2011-01-07 DIAGNOSIS — Z7901 Long term (current) use of anticoagulants: Secondary | ICD-10-CM

## 2011-01-07 LAB — BASIC METABOLIC PANEL
CO2: 28 mEq/L (ref 19–32)
Calcium: 9.1 mg/dL (ref 8.4–10.5)
Creatinine, Ser: 1.3 mg/dL (ref 0.4–1.5)
GFR: 60.92 mL/min (ref >60.00)
Sodium: 140 mEq/L (ref 135–145)

## 2011-01-07 LAB — TSH: TSH: 21.69 u[IU]/mL — ABNORMAL HIGH (ref 0.35–5.50)

## 2011-01-07 LAB — HEPATIC FUNCTION PANEL
ALT: 42 U/L (ref 0–53)
AST: 30 U/L (ref 0–37)
Albumin: 4.1 g/dL (ref 3.5–5.2)
Alkaline Phosphatase: 75 U/L (ref 39–117)
Total Protein: 7.4 g/dL (ref 6.0–8.3)

## 2011-01-07 NOTE — Assessment & Plan Note (Signed)
He is up to date with follow up of this. 

## 2011-01-07 NOTE — Progress Notes (Signed)
HPI The patient returns for followup of his cardiomyopathy. Since I last saw him he has done well.  The patient denies any new symptoms such as chest discomfort, neck or arm discomfort. There has been no new shortness of breath, PND or orthopnea. There have been no reported palpitations, presyncope or syncope.  No Known Allergies  Current Outpatient Prescriptions  Medication Sig Dispense Refill  . acetaminophen (TYLENOL) 325 MG tablet Take 650 mg by mouth every 4 (four) hours as needed.        . ALPRAZolam (XANAX) 0.25 MG tablet Take 0.25 mg by mouth at bedtime as needed.        Marland Kitchen amiodarone (PACERONE) 200 MG tablet Take two tablets by mouth once daily  60 tablet  11  . cyclobenzaprine (FLEXERIL) 10 MG tablet Take 10 mg by mouth as needed.        . digoxin (LANOXIN) 0.125 MG tablet TAKE 1 TABLET BY MOUTH ONCE DAILY  30 tablet  6  . enalapril (VASOTEC) 5 MG tablet Take 1 tablet (5 mg total) by mouth 2 (two) times daily.  60 tablet  6  . furosemide (LASIX) 80 MG tablet Take 80 mg by mouth as needed.       Marland Kitchen levothyroxine (SYNTHROID, LEVOTHROID) 150 MCG tablet Take 1 tablet (150 mcg total) by mouth daily.  30 tablet  6  . metoprolol (LOPRESSOR) 50 MG tablet TAKE 1 AND 1/2 TABLETS TWICE DAILY  60 tablet  12  . pantoprazole (PROTONIX) 40 MG tablet Take 40 mg by mouth as needed.       Marland Kitchen spironolactone (ALDACTONE) 25 MG tablet Take 25 mg by mouth daily.        Marland Kitchen warfarin (COUMADIN) 7.5 MG tablet Take as directed by Anticoagulation clinic  50 tablet  3    Past Medical History  Diagnosis Date  . Systolic CHF, chronic   . Non-ischemic cardiomyopathy     echo 11/10: EF 15%, mild MR, mod LAE, mild dec. RVSF, PASP;   cath 10/08: normal cors  . Chronic atrial fibrillation     ON COUMADIN  . Hypertension   . Hypothyroidism     s/p RAI therapy  . Obesity   . Non-compliance   . Dyslexia   . History of CVA (cerebrovascular accident) 11/2008    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 tx with tPA and PTA and stent    . RBBB (right bundle branch block)   . Palpitations     W/WIDE COMPLEX TACHYCARDIA IN THE PAST, REFUSED REPEATED OFFERS OF AN ICD  . Biventricular ICD  [employer] Jud[employer]]   . Obesity     Past Surgical History  Procedure Date  . Insert / replace / remove pacemaker 06/11/09    [employer] NF6213-08 BIVENTRICULAR AICD SERIAL [phone]  . Knee surgery     LEFT X2  . Cardiac catheterization     ROS:  Headache.  Otherwise as stated in the HPI and negative for all other systems.  PHYSICAL EXAM BP 108/72  Pulse 60  Resp 16  Ht 5\' 9"  (1.753 m)  Wt 126.554 kg (279 lb)  BMI 41.20 kg/m2 GENERAL:  Well appearing HEENT:  Pupils equal round and reactive, fundi not visualized, oral mucosa unremarkable NECK:  No jugular venous distention, waveform within normal limits, carotid upstroke brisk and symmetric, no bruits, no thyromegaly LYMPHATICS:  No cervical, inguinal adenopathy LUNGS:  Clear to auscultation bilaterally BACK:  No CVA tenderness CHEST:  ICD pocket  HEART:  PMI not displaced or sustained,S1 and S2 within normal limits, no S3, no S4, no clicks, no rubs, no murmurs ABD:  Flat, positive bowel sounds normal in frequency in pitch, no bruits, no rebound, no guarding, no midline pulsatile mass, no hepatomegaly, no splenomegaly EXT:  2 plus pulses throughout, no edema, no cyanosis no clubbing SKIN:  No rashes no nodules NEURO:  Cranial nerves II through XII grossly intact, motor right hemiparesis PSYCH:  Cognitively intact, oriented to person place and time  ASSESSMENT AND PLAN

## 2011-01-07 NOTE — Assessment & Plan Note (Signed)
The patient understands the need to lose weight with diet and exercise. We have discussed specific strategies for this.  I did not approve OTC weight loss meds that he wanted to try.

## 2011-01-07 NOTE — Assessment & Plan Note (Signed)
He seems to be euvolemic.  At this point, no change in therapy is indicated.  We have reviewed salt and fluid restrictions.  No further cardiovascular testing is indicated.   

## 2011-01-07 NOTE — Assessment & Plan Note (Signed)
He is not having symptomatic paroxysms. I will check thyroid and a cemented today.

## 2011-01-07 NOTE — Patient Instructions (Signed)
The current medical regimen is effective;  continue present plan and medications.  Follow up in 6 months with Dr Hochrein.  You will receive a letter in the mail 2 months before you are due.  Please call us when you receive this letter to schedule your follow up appointment.  

## 2011-01-08 LAB — T4, FREE: Free T4: 0.78 ng/dL (ref 0.60–1.60)

## 2011-01-20 ENCOUNTER — Ambulatory Visit (INDEPENDENT_AMBULATORY_CARE_PROVIDER_SITE_OTHER): Payer: Medicare Other | Admitting: *Deleted

## 2011-01-20 DIAGNOSIS — I4891 Unspecified atrial fibrillation: Secondary | ICD-10-CM

## 2011-01-20 DIAGNOSIS — Z7901 Long term (current) use of anticoagulants: Secondary | ICD-10-CM

## 2011-01-25 MED ORDER — LEVOTHYROXINE SODIUM 200 MCG PO TABS: 200.0000 ug | ORAL_TABLET | Freq: Every day | ORAL | Status: DC

## 2011-01-25 NOTE — Progress Notes (Signed)
Addended by: Sharin Grave on: 01/25/2011 05:05 PM   Modules accepted: Orders

## 2011-02-03 ENCOUNTER — Encounter: Payer: Medicare Other | Admitting: *Deleted

## 2011-02-04 ENCOUNTER — Ambulatory Visit (INDEPENDENT_AMBULATORY_CARE_PROVIDER_SITE_OTHER): Payer: Medicare Other | Admitting: *Deleted

## 2011-02-04 ENCOUNTER — Ambulatory Visit (INDEPENDENT_AMBULATORY_CARE_PROVIDER_SITE_OTHER): Payer: Medicare Other | Admitting: Family Medicine

## 2011-02-04 DIAGNOSIS — Z7901 Long term (current) use of anticoagulants: Secondary | ICD-10-CM

## 2011-02-04 DIAGNOSIS — I4891 Unspecified atrial fibrillation: Secondary | ICD-10-CM

## 2011-02-04 DIAGNOSIS — E669 Obesity, unspecified: Secondary | ICD-10-CM

## 2011-02-04 DIAGNOSIS — Z79899 Other long term (current) drug therapy: Secondary | ICD-10-CM

## 2011-02-04 DIAGNOSIS — M25569 Pain in unspecified knee: Secondary | ICD-10-CM

## 2011-02-15 ENCOUNTER — Other Ambulatory Visit: Payer: Self-pay | Admitting: Cardiology

## 2011-02-16 ENCOUNTER — Telehealth: Payer: Self-pay

## 2011-02-16 NOTE — Telephone Encounter (Signed)
.  [facility] calling to see if we have received knee brace form. If so, they need it signed off and faxed to [phone] Best contact: [phone]  bf

## 2011-02-18 ENCOUNTER — Ambulatory Visit (INDEPENDENT_AMBULATORY_CARE_PROVIDER_SITE_OTHER): Payer: Medicare Other | Admitting: *Deleted

## 2011-02-18 DIAGNOSIS — Z7901 Long term (current) use of anticoagulants: Secondary | ICD-10-CM

## 2011-02-18 DIAGNOSIS — I4891 Unspecified atrial fibrillation: Secondary | ICD-10-CM

## 2011-02-18 LAB — POCT INR: INR: 2.5

## 2011-02-19 ENCOUNTER — Ambulatory Visit: Payer: Medicare Other | Admitting: *Deleted

## 2011-02-19 NOTE — Telephone Encounter (Signed)
.  [facility] PT STATES [facility] HAVE BEEN TRYING TO GET IN TOUCH WITH DR MCPHERSON TO SIGN FOR HIM TO GET A KNEE BRACE AND THEY HAVEN'T HEARD FROM ANYONE. HE REALLY NEED TO GET THIS ASAP. PLEASE CALL PT AT [phone]

## 2011-02-19 NOTE — Telephone Encounter (Signed)
The form re: knee brace will be competed and ready for pick-up at 102.

## 2011-02-20 NOTE — Telephone Encounter (Signed)
Spoke with pts mom, she states form needs to be faxed in to medical supply store.  Form is not at 102 in pickup drawer.  Found chart and form, faxed to supply store.  Called son and notified.

## 2011-03-17 ENCOUNTER — Ambulatory Visit (INDEPENDENT_AMBULATORY_CARE_PROVIDER_SITE_OTHER): Payer: Medicare Other | Admitting: *Deleted

## 2011-03-17 DIAGNOSIS — I4891 Unspecified atrial fibrillation: Secondary | ICD-10-CM

## 2011-03-17 DIAGNOSIS — Z7901 Long term (current) use of anticoagulants: Secondary | ICD-10-CM

## 2011-03-17 LAB — POCT INR: INR: 3

## 2011-03-19 ENCOUNTER — Encounter: Payer: Self-pay | Admitting: Emergency Medicine

## 2011-03-19 ENCOUNTER — Telehealth: Payer: Self-pay

## 2011-03-19 MED ORDER — HYDROCODONE-ACETAMINOPHEN 7.5-325 MG PO TABS: 1.0000 | ORAL_TABLET | Freq: Four times a day (QID) | ORAL | Status: AC | PRN

## 2011-03-19 NOTE — Telephone Encounter (Signed)
.  [facility] PT STATES HIS PHARMACY HAVE BEEN TRYING TO REFILL HIS HYDROCODONE AND THEY HAVEN'T HEARD FROM [place] PLEASE CALL PT AT [phone]   [facility]

## 2011-03-19 NOTE — Telephone Encounter (Signed)
Spoke with patient and notified rx called into [facility].  He will recheck for more.

## 2011-03-19 NOTE — Telephone Encounter (Signed)
Last filled 12/02/2010 so did 1 rf and then pt must have an ov before more refills.  Signed and at TL desk.

## 2011-03-23 ENCOUNTER — Encounter: Payer: Medicare Other | Admitting: Internal Medicine

## 2011-03-29 ENCOUNTER — Other Ambulatory Visit: Payer: Self-pay | Admitting: Cardiology

## 2011-04-14 ENCOUNTER — Ambulatory Visit (INDEPENDENT_AMBULATORY_CARE_PROVIDER_SITE_OTHER): Payer: Medicare Other | Admitting: *Deleted

## 2011-04-14 DIAGNOSIS — Z7901 Long term (current) use of anticoagulants: Secondary | ICD-10-CM

## 2011-04-14 DIAGNOSIS — I4891 Unspecified atrial fibrillation: Secondary | ICD-10-CM

## 2011-04-14 LAB — POCT INR: INR: 1.3

## 2011-04-20 ENCOUNTER — Ambulatory Visit (INDEPENDENT_AMBULATORY_CARE_PROVIDER_SITE_OTHER): Payer: Medicare Other | Admitting: Internal Medicine

## 2011-04-20 ENCOUNTER — Encounter: Payer: Self-pay | Admitting: Internal Medicine

## 2011-04-20 VITALS — BP 134/68 | HR 75 | Ht 69.0 in | Wt 285.5 lb

## 2011-04-20 DIAGNOSIS — I4891 Unspecified atrial fibrillation: Secondary | ICD-10-CM

## 2011-04-20 DIAGNOSIS — I5022 Chronic systolic (congestive) heart failure: Secondary | ICD-10-CM

## 2011-04-20 DIAGNOSIS — I428 Other cardiomyopathies: Secondary | ICD-10-CM

## 2011-04-20 LAB — BASIC METABOLIC PANEL
CO2: 30 mEq/L (ref 19–32)
Chloride: 101 mEq/L (ref 96–112)
Potassium: 4.7 mEq/L (ref 3.5–5.1)
Sodium: 138 mEq/L (ref 135–145)

## 2011-04-20 LAB — HEPATIC FUNCTION PANEL
Albumin: 4 g/dL (ref 3.5–5.2)
Bilirubin, Direct: 0.1 mg/dL (ref 0.0–0.3)
Total Protein: 7.2 g/dL (ref 6.0–8.3)

## 2011-04-20 NOTE — Assessment & Plan Note (Signed)
He is unable to read. We have discussed a low carbohydrate diet. He'll try this.

## 2011-04-20 NOTE — Assessment & Plan Note (Signed)
Stable on current medications 

## 2011-04-20 NOTE — Assessment & Plan Note (Signed)
The patient's device was interrogated.  The information was reviewed. No changes were made in the programming.    

## 2011-04-20 NOTE — Assessment & Plan Note (Signed)
Continue current medications. 

## 2011-04-20 NOTE — Progress Notes (Signed)
HPI  Raymond [name] is a 51 y.o. male Seen in Followup for congestive heart failure in the setting of nonischemic cardiomyopathy for which he is status post CRT-D implantation.  He also has a history of atrial fibrillation for which he was cardioverted following loading with amiodarone. He has a history of a prior stroke  The patient was noted to have an elevated TSH on amiodarone therapy; his Synthroid was increased   He is doing relatively well. He is going to the gym a couple times a week. He is struggling with weight largely unsuccessfully. No recent chest pain or peripheral edema and shortness of breath is stable.  2012 Myoview scanning which demonstrated   Ejection fraction of 17% with mild ischemia felt to be relatively low risk given his known nonischemic cardiomyopathy    Past Medical History  Diagnosis Date  . Systolic CHF, chronic   . Non-ischemic cardiomyopathy     echo 11/10: EF 15%, mild MR, mod LAE, mild dec. RVSF, PASP;   cath 10/08: normal cors  . Chronic atrial fibrillation     ON COUMADIN  . Hypertension   . Hypothyroidism     s/p RAI therapy  . Obesity   . Non-compliance   . Dyslexia   . 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 RBBB (right bundle branch block)   . Palpitations     W/WIDE COMPLEX TACHYCARDIA IN THE PAST, REFUSED REPEATED OFFERS OF AN ICD  . Biventricular ICD  st Judes]   . Obesity     Past Surgical History  Procedure Date  . Insert / replace / remove pacemaker 06/11/09    [employer] [id number] BIVENTRICULAR AICD SERIAL [phone]  . Knee surgery     LEFT X2  . Cardiac catheterization     Current Outpatient Prescriptions  Medication Sig Dispense Refill  . acetaminophen (TYLENOL) 325 MG tablet Take 650 mg by mouth every 4 (four) hours as needed.        Marland Kitchen acyclovir (ZOVIRAX) 800 MG tablet Take 1/2 tablet by mouth twice daily      . ALPRAZolam (XANAX) 0.25 MG  tablet Take 0.25 mg by mouth at bedtime as needed.        Marland Kitchen amiodarone (PACERONE) 200 MG tablet Take two tablets by mouth once daily  60 tablet  11  . digoxin (LANOXIN) 0.125 MG tablet TAKE 1 TABLET BY MOUTH ONCE DAILY  30 tablet  6  . enalapril (VASOTEC) 5 MG tablet TAKE 1 TABLET BY MOUTH TWICE DAILY  60 tablet  6  . furosemide (LASIX) 80 MG tablet Take 80 mg by mouth as needed.       Marland Kitchen HYDROcodone-acetaminophen (NORCO) 7.5-325 MG per tablet Take 1 tablet by mouth every 6 (six) hours as needed.      Marland Kitchen levothyroxine (SYNTHROID, LEVOTHROID) 200 MCG tablet Take 1 tablet (200 mcg total) by mouth daily.  30 tablet  6  . metoprolol (LOPRESSOR) 50 MG tablet TAKE 1 AND 1/2 TABLETS TWICE DAILY  60 tablet  12  . spironolactone (ALDACTONE) 50 MG tablet Take 1/2 tablet by mouth once daily      . warfarin (COUMADIN) 7.5 MG tablet TAKE AS DIRECTED BY ANTICOAGULATION     CLINIC  50 tablet  2  . cyclobenzaprine (FLEXERIL) 10 MG tablet Take 10 mg by mouth as needed.        . pantoprazole (  PROTONIX) 40 MG tablet Take 40 mg by mouth as needed.         No Known Allergies  Review of Systems negative except from HPI and PMH  Physical Exam Well developed and well nourished in no acute distress  HENT normal E scleral and icterus clear Neck Supple JVP flat; Clear to ausculation Regular rate and rhythm, no murmurs gallops or rub Soft with active bowel sounds No clubbing cyanosis and edema Alert and oriented, grossly normal motor and sensory function Skin Warm and Dry     Assessment and  Plan

## 2011-04-20 NOTE — Assessment & Plan Note (Signed)
Very infrequent atrial fibrillation as assessed by his defibrillator. Will check his digoxin level today as well as decrease his amiodarone from 202 times a day to once a day. We'll check liver function testing as part of amiodarone surveillance

## 2011-04-20 NOTE — Patient Instructions (Signed)
Your physician has recommended you make the following change in your medication:  1) Decrease amiodarone to 200 mg one tablet by mouth once daily.  Your physician recommends that you have lab work today: bmp/liver/digoxin  Your physician wants you to follow-up in: 1 year with Dr. Graciela Husbands. You will receive a reminder letter in the mail two months in advance. If you don't receive a letter, please call our office to schedule the follow-up appointment.

## 2011-04-21 LAB — DIGOXIN LEVEL: Digoxin Level: 0.6 ng/mL — ABNORMAL LOW (ref 0.8–2.0)

## 2011-04-28 ENCOUNTER — Ambulatory Visit (INDEPENDENT_AMBULATORY_CARE_PROVIDER_SITE_OTHER): Payer: Medicare Other | Admitting: *Deleted

## 2011-04-28 DIAGNOSIS — Z7901 Long term (current) use of anticoagulants: Secondary | ICD-10-CM

## 2011-04-28 DIAGNOSIS — I4891 Unspecified atrial fibrillation: Secondary | ICD-10-CM

## 2011-05-12 ENCOUNTER — Ambulatory Visit (INDEPENDENT_AMBULATORY_CARE_PROVIDER_SITE_OTHER): Payer: Medicare Other | Admitting: Pharmacist

## 2011-05-12 DIAGNOSIS — Z7901 Long term (current) use of anticoagulants: Secondary | ICD-10-CM

## 2011-05-12 DIAGNOSIS — I4891 Unspecified atrial fibrillation: Secondary | ICD-10-CM

## 2011-05-12 LAB — POCT INR: INR: 2.4

## 2011-05-31 ENCOUNTER — Other Ambulatory Visit: Payer: Self-pay | Admitting: Cardiology

## 2011-05-31 DIAGNOSIS — I4891 Unspecified atrial fibrillation: Secondary | ICD-10-CM

## 2011-05-31 NOTE — Telephone Encounter (Signed)
..   Requested Prescriptions   Pending Prescriptions Disp Refills  . spironolactone (ALDACTONE) 50 MG tablet [Pharmacy Med Name: SPIRONOLACTONE 50 MG TABS] 30 tablet     Sig: TAKE 1/2 TABLET BY MOUTH ONCE DAILY  . metoprolol (LOPRESSOR) 50 MG tablet 60 tablet     Sig: TAKE 1 & 1/2 TABLETS BY MOUTH TWICE     DAILY

## 2011-06-04 ENCOUNTER — Ambulatory Visit (INDEPENDENT_AMBULATORY_CARE_PROVIDER_SITE_OTHER): Payer: Medicare Other | Admitting: Pharmacist

## 2011-06-04 DIAGNOSIS — I4891 Unspecified atrial fibrillation: Secondary | ICD-10-CM

## 2011-06-04 DIAGNOSIS — Z7901 Long term (current) use of anticoagulants: Secondary | ICD-10-CM

## 2011-06-04 LAB — POCT INR: INR: 1.7

## 2011-06-21 ENCOUNTER — Telehealth: Payer: Self-pay | Admitting: Cardiology

## 2011-06-21 DIAGNOSIS — E669 Obesity, unspecified: Secondary | ICD-10-CM

## 2011-06-21 NOTE — Telephone Encounter (Signed)
New msg Pt wants to you to recommend a gastro dr. Please call

## 2011-06-21 NOTE — Telephone Encounter (Signed)
Pt states he wants "surgery for weight loss".  He states he has gained 100# since his cva and is unable to lose weight by dieting.  He is requesting a referral or physician recommendation from Dr Antoine Poche.  Pt knows Dr Antoine Poche is off today.

## 2011-06-22 NOTE — Telephone Encounter (Signed)
Refer to the Bariatric Surgery Program at [facility]

## 2011-06-23 NOTE — Telephone Encounter (Signed)
Will refer to bariatric center for wt loss as ordered

## 2011-06-25 ENCOUNTER — Ambulatory Visit (INDEPENDENT_AMBULATORY_CARE_PROVIDER_SITE_OTHER): Payer: Medicare Other | Admitting: *Deleted

## 2011-06-25 ENCOUNTER — Telehealth: Payer: Self-pay | Admitting: Cardiology

## 2011-06-25 ENCOUNTER — Ambulatory Visit (INDEPENDENT_AMBULATORY_CARE_PROVIDER_SITE_OTHER): Payer: Self-pay | Admitting: Family Medicine

## 2011-06-25 ENCOUNTER — Encounter: Payer: Self-pay | Admitting: Family Medicine

## 2011-06-25 VITALS — BP 120/74 | HR 70 | Temp 97.3°F | Resp 16 | Ht 68.5 in | Wt 287.0 lb

## 2011-06-25 DIAGNOSIS — E039 Hypothyroidism, unspecified: Secondary | ICD-10-CM

## 2011-06-25 DIAGNOSIS — Z7901 Long term (current) use of anticoagulants: Secondary | ICD-10-CM

## 2011-06-25 DIAGNOSIS — I6990 Unspecified sequelae of unspecified cerebrovascular disease: Secondary | ICD-10-CM

## 2011-06-25 DIAGNOSIS — I4891 Unspecified atrial fibrillation: Secondary | ICD-10-CM

## 2011-06-25 DIAGNOSIS — M25569 Pain in unspecified knee: Secondary | ICD-10-CM

## 2011-06-25 DIAGNOSIS — M898X9 Other specified disorders of bone, unspecified site: Secondary | ICD-10-CM

## 2011-06-25 DIAGNOSIS — M25562 Pain in left knee: Secondary | ICD-10-CM

## 2011-06-25 DIAGNOSIS — M949 Disorder of cartilage, unspecified: Secondary | ICD-10-CM

## 2011-06-25 DIAGNOSIS — E66813 Obesity, class 3: Secondary | ICD-10-CM

## 2011-06-25 DIAGNOSIS — I6930 Unspecified sequelae of cerebral infarction: Secondary | ICD-10-CM

## 2011-06-25 LAB — POCT INR: INR: 2.9

## 2011-06-25 NOTE — Telephone Encounter (Signed)
Pt aware that RN from bariatric center should be calling to schedule an appt for the pt.  We would not be giving clearance for this surgery until he is seen by the bariatric center and clearance deemed necessary.  Pt states understanding

## 2011-06-25 NOTE — Progress Notes (Signed)
  Subjective:    Patient ID: Raymond [name], male    DOB: 23-Oct-1960, 51 y.o.   MRN: [id number]  HPI  This 51 y.o. Cauc male c/o weight gain since last visit (he states he has gained 52 pounds since his stroke  2 years ago). He has tried Sensa without benefit and cannot do Weight Watchers because of salt content  of foods. He wants to be referred for gastric bypass and mentions a physician in another state who "will do   bypass surgery on just about anybody". He drinks lots of water all day and gets very little sleep because of  nocturia. Exercises daily on reclining bicycle at gym for 30 minutes. Eats 800-900 calories daily. Cannot afford  to go to Nutritionist (costs $200 and insurance will not cover cost). Left knee pain/bone pain is increasing because  of weight. His thyroid medication dose was increased several months ago and he did not return for repeat lab as  scheduled for March.    Review of Systems  Constitutional: Positive for activity change and appetite change. Negative for fever and fatigue.  Respiratory: Negative for cough, chest tightness and shortness of breath.   Cardiovascular: Negative for chest pain, palpitations and leg swelling.  Musculoskeletal: Positive for gait problem.       Chronic problems with ambulation related to CVA  Skin: Negative for pallor.  Neurological: Negative for weakness, light-headedness and headaches.       Objective:   Physical Exam  Vitals reviewed. Constitutional: He is oriented to person, place, and time. He appears well-developed and well-nourished. No distress.  HENT:  Head: Normocephalic and atraumatic.  Eyes: Conjunctivae and EOM are normal. No scleral icterus.  Cardiovascular: Normal rate.   Pulmonary/Chest: Effort normal. No respiratory distress.  Musculoskeletal: He exhibits no edema.       L knee tenderness with mild stiffness, no effusion or erythema; decreased ROM  Neurological: He is alert and oriented to person, place, and  time. No cranial nerve deficit. Coordination abnormal.       Left UE weakness s/p stroke- 3+/5 strength Ambulates with cane in R hand  Skin: Skin is warm and dry.  Psychiatric: He has a normal mood and affect. His behavior is normal. Thought content normal.    Weight is unchanged from February 04, 2011       Assessment & Plan:   1. Hypothyroidism  T4, Free, TSH  2. Obesity, Class III, BMI 40-49.9 (morbid obesity)  Weight is up from 266#  in June 2012  3. Bone pain  Vitamin D, 25-hydroxy  4. Knee pain, left  Vitamin D, 25-hydroxy

## 2011-06-25 NOTE — Telephone Encounter (Signed)
New problem:  Patient calling back, check on status of clearance for gastric bypass surgery. H/O CVA.

## 2011-06-25 NOTE — Patient Instructions (Signed)
Hypothyroidism The thyroid is a large gland located in the lower front of your neck. The thyroid gland helps control metabolism. Metabolism is how your body handles food. It controls metabolism with the hormone thyroxine. When this gland is underactive (hypothyroid), it produces too little hormone.  CAUSES These include:   Absence or destruction of thyroid tissue.   Goiter due to iodine deficiency.   Goiter due to medications.   Congenital defects (since birth).   Problems with the pituitary. This causes a lack of TSH (thyroid stimulating hormone). This hormone tells the thyroid to turn out more hormone.  SYMPTOMS  Lethargy (feeling as though you have no energy)   Cold intolerance   Weight gain (in spite of normal food intake)   Dry skin   Coarse hair   Menstrual irregularity (if severe, may lead to infertility)   Slowing of thought processes  Cardiac problems are also caused by insufficient amounts of thyroid hormone. Hypothyroidism in the newborn is cretinism, and is an extreme form. It is important that this form be treated adequately and immediately or it will lead rapidly to retarded physical and mental development. DIAGNOSIS  To prove hypothyroidism, your caregiver may do blood tests and ultrasound tests. Sometimes the signs are hidden. It may be necessary for your caregiver to watch this illness with blood tests either before or after diagnosis and treatment. TREATMENT  Low levels of thyroid hormone are increased by using synthetic thyroid hormone. This is a safe, effective treatment. It usually takes about four weeks to gain the full effects of the medication. After you have the full effect of the medication, it will generally take another four weeks for problems to leave. Your caregiver may start you on low doses. If you have had heart problems the dose may be gradually increased. It is generally not an emergency to get rapidly to normal. HOME CARE INSTRUCTIONS   Take  your medications as your caregiver suggests. Let your caregiver know of any medications you are taking or start taking. Your caregiver will help you with dosage schedules.   As your condition improves, your dosage needs may increase. It will be necessary to have continuing blood tests as suggested by your caregiver.   Report all suspected medication side effects to your caregiver.  SEEK MEDICAL CARE IF: Seek medical care if you develop:  Sweating.   Tremulousness (tremors).   Anxiety.   Rapid weight loss.   Heat intolerance.   Emotional swings.   Diarrhea.   Weakness.  SEEK IMMEDIATE MEDICAL CARE IF:  You develop chest pain, an irregular heart beat (palpitations), or a rapid heart beat. MAKE SURE YOU:   Understand these instructions.   Will watch your condition.   Will get help right away if you are not doing well or get worse.  Document Released: 01/04/2005 Document Revised: 12/24/2010 Document Reviewed: 08/25/2007 [facility] Patient Information 2012 [place], [place].        Obesity Obesity is defined as having a body mass index (BMI) of 30 or more. To calculate your BMI divide your weight in pounds by your height in inches squared and multiply that product by 703. Major illnesses resulting from long-term obesity include:  Stroke.   Heart disease.   Diabetes.   Many cancers.   Arthritis.  Obesity also complicates recovery from many other medical problems.  CAUSES   A history of obesity in your parents.   Thyroid hormone imbalance.   Environmental factors such as excess calorie intake and physical  inactivity.  TREATMENT  A healthy weight loss program includes:  A calorie restricted diet based on individual calorie needs.   Increased physical activity (exercise).  An exercise program is just as important as the right low-calorie diet.  Weight-loss medicines should be used only under the supervision of your physician. These medicines help, but only if they  are used with diet and exercise programs. Medicines can have side effects including nervousness, nausea, abdominal pain, diarrhea, headache, drowsiness, and depression.  An unhealthy weight loss program includes:  Fasting.   Fad diets.   Supplements and drugs.  These choices do not succeed in long-term weight control.  HOME CARE INSTRUCTIONS  To help you make the needed dietary changes:   Exercise and perform physical activity as directed by your caregiver.   Keep a daily record of everything you eat. There are many free websites to help you with this. It may be helpful to measure your foods so you can determine if you are eating the correct portion sizes.   Use low-calorie cookbooks or take special cooking classes.   Avoid alcohol. Drink more water and drinks with no calories.   Take vitamins and supplements only as recommended by your caregiver.   Weight loss support groups, Registered Dieticians, counselors, and stress reduction education can also be very helpful.  Document Released: 02/12/2004 Document Revised: 12/24/2010 Document Reviewed: 12/11/2006 [facility] Patient Information 2012 [place], [place].

## 2011-06-29 ENCOUNTER — Telehealth: Payer: Self-pay | Admitting: *Deleted

## 2011-06-29 ENCOUNTER — Other Ambulatory Visit: Payer: Self-pay | Admitting: Family Medicine

## 2011-06-29 DIAGNOSIS — E039 Hypothyroidism, unspecified: Secondary | ICD-10-CM

## 2011-06-29 DIAGNOSIS — I6990 Unspecified sequelae of unspecified cerebrovascular disease: Secondary | ICD-10-CM | POA: Insufficient documentation

## 2011-06-29 MED ORDER — LEVOTHYROXINE SODIUM 50 MCG PO TABS: 50.0000 ug | ORAL_TABLET | Freq: Every day | ORAL | Status: DC

## 2011-06-29 MED ORDER — ACYCLOVIR 800 MG PO TABS: 800.0000 mg | ORAL_TABLET | Freq: Every day | ORAL | Status: DC

## 2011-06-29 NOTE — Telephone Encounter (Signed)
Pt.notified

## 2011-06-29 NOTE — Progress Notes (Signed)
Quick Note:  Please call pt and advise that the following labs are abnormal... Thyroid result is much better but still shows inadequate replacement. Need to add 50 mcg to what you are currently taking for total daily dose of 250 mcg of Levothyroxine.  Level will need to be rechecked in ~ 6-8 weeks.  Vitamin D level is also low; get an over-the-counter Vitamin D3 supplement 2000 IU capsule and take 1 everyday. ______

## 2011-06-29 NOTE — Telephone Encounter (Signed)
Rx sent to pharmacy   

## 2011-06-29 NOTE — Telephone Encounter (Signed)
Pt needs a refill on his Acyclovir

## 2011-07-09 ENCOUNTER — Telehealth: Payer: Self-pay

## 2011-07-09 NOTE — Telephone Encounter (Signed)
Pt is requesting rx for xanax pharmacy is [facility].Marland Kitchen

## 2011-07-10 NOTE — Telephone Encounter (Signed)
Pull paper chart please

## 2011-07-12 ENCOUNTER — Telehealth: Payer: Self-pay | Admitting: Cardiology

## 2011-07-12 MED ORDER — ALPRAZOLAM 0.25 MG PO TABS: 0.2500 mg | ORAL_TABLET | Freq: Every evening | ORAL | Status: DC | PRN

## 2011-07-12 NOTE — Telephone Encounter (Signed)
Paper chart is in phone message stack at [facility] desk - [phone]

## 2011-07-12 NOTE — Telephone Encounter (Signed)
Patient notified and disagrees with our advice to follow-up.  He will discuss this next month with Dr. Audria Nine... Patient not being appropriate over the phone.  Rx called in.

## 2011-07-12 NOTE — Telephone Encounter (Signed)
Rx printed.  Please advise patient that he needs an office visit for additional refills.

## 2011-07-12 NOTE — Telephone Encounter (Signed)
Please return call to patient at [phone] to discuss medication issues.

## 2011-07-14 NOTE — Telephone Encounter (Signed)
Pt reports he saw Dr Audria Nine who told him that he is on a medication that is "messing up his thyroid"  He doesn't remember the name of the medication but thinks it might be Amiodarone.  Wants to know if there is anything else he could take so that this does not continue to occure.  Instructed pt to continue medications and that I will discuss with Dr Antoine Poche and call him back.  He is in agreement.

## 2011-07-15 NOTE — Telephone Encounter (Signed)
We could switch to Tikosyn but I would want to see him to talk about this.

## 2011-07-16 NOTE — Telephone Encounter (Signed)
Left message for pt - holding appt 7/2 at 12:15 pm.  Requested he call back to confirm

## 2011-07-20 ENCOUNTER — Encounter: Payer: Self-pay | Admitting: Cardiology

## 2011-07-20 ENCOUNTER — Ambulatory Visit (INDEPENDENT_AMBULATORY_CARE_PROVIDER_SITE_OTHER): Payer: Medicare Other | Admitting: Cardiology

## 2011-07-20 VITALS — BP 125/82 | HR 70 | Ht 69.0 in | Wt 282.8 lb

## 2011-07-20 DIAGNOSIS — I4891 Unspecified atrial fibrillation: Secondary | ICD-10-CM

## 2011-07-20 DIAGNOSIS — I5022 Chronic systolic (congestive) heart failure: Secondary | ICD-10-CM

## 2011-07-20 NOTE — Assessment & Plan Note (Signed)
He seems to be euvolemic.  At this point, no change in therapy is indicated.  We have reviewed salt and fluid restrictions.  No further cardiovascular testing is indicated.   

## 2011-07-20 NOTE — Progress Notes (Signed)
HPI The patient returns for followup of his cardiomyopathy.  Since I last saw him he has done well.  He has had trouble with his thyroid probably related to his amiodarone. He had his Synthroid dose increased again a couple of weeks ago and a followup TSH is pending. He wanted to stop or talk about stopping the amiodarone. He thought the pacemaker was meant to control the fibrillation.    He otherwise has been feeling well. 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. With this he denies any chest pressure, neck or arm discomfort. He has no palpitations, presyncope or syncope. He is no PND or orthopnea. He does have fatigue and he's on his feet quite a while.    No Known Allergies  Current Outpatient Prescriptions  Medication Sig Dispense Refill  . acetaminophen (TYLENOL) 325 MG tablet Take 650 mg by mouth every 4 (four) hours as needed.        Marland Kitchen acyclovir (ZOVIRAX) 800 MG tablet Take 1 tablet (800 mg total) by mouth daily. Take 1/2 tablet by mouth twice daily  30 tablet  0  . ALPRAZolam (XANAX) 0.25 MG tablet Take 1 tablet (0.25 mg total) by mouth at bedtime as needed. Need office visit for additional refills.  30 tablet  0  . amiodarone (PACERONE) 200 MG tablet Take 1 tablet (200 mg total) by mouth daily.      . digoxin (LANOXIN) 0.125 MG tablet TAKE 1 TABLET BY MOUTH ONCE DAILY  30 tablet  6  . enalapril (VASOTEC) 5 MG tablet TAKE 1 TABLET BY MOUTH TWICE DAILY  60 tablet  6  . furosemide (LASIX) 80 MG tablet Take 80 mg by mouth as needed.       Marland Kitchen HYDROcodone-acetaminophen (NORCO) 7.5-325 MG per tablet Take 1 tablet by mouth every 6 (six) hours as needed.      Marland Kitchen levothyroxine (SYNTHROID, LEVOTHROID) 200 MCG tablet Take 1 tablet (200 mcg total) by mouth daily.  30 tablet  6  . levothyroxine (SYNTHROID, LEVOTHROID) 50 MCG tablet Take 1 tablet (50 mcg total) by mouth daily.  30 tablet  3  . metoprolol (LOPRESSOR) 50 MG tablet TAKE 1 & 1/2 TABLETS BY MOUTH TWICE     DAILY  60 tablet  6    . spironolactone (ALDACTONE) 50 MG tablet TAKE 1/2 TABLET BY MOUTH ONCE DAILY  15 tablet  6  . warfarin (COUMADIN) 7.5 MG tablet TAKE AS DIRECTED BY ANTICOAGULATION     CLINIC  50 tablet  2    Past Medical History  Diagnosis Date  . Systolic CHF, chronic   . Non-ischemic cardiomyopathy     echo 11/10: EF 15%, mild MR, mod LAE, mild dec. RVSF, PASP;   cath 10/08: normal cors  . Chronic atrial fibrillation     ON COUMADIN  . Hypertension   . Hypothyroidism     s/p RAI therapy  . Obesity   . Non-compliance   . Dyslexia   . 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 RBBB (right bundle branch block)   . Palpitations     W/WIDE COMPLEX TACHYCARDIA IN THE PAST, REFUSED REPEATED OFFERS OF AN ICD  . Biventricular ICD  [employer]]   . Obesity     Past Surgical History  Procedure Date  . Insert / replace / remove pacemaker 06/11/09    [employer] [id number] BIVENTRICULAR  AICD SERIAL [id number]  . Knee surgery     LEFT X2  . Cardiac catheterization     ROS:  As stated in the HPI and negative for all other systems.  PHYSICAL EXAM BP 125/82  Pulse 70  Ht 5\' 9"  (1.753 m)  Wt 282 lb 12.8 oz (128.277 kg)  BMI 41.76 kg/m2 GENERAL:  Well appearing HEENT:  Pupils equal round and reactive, fundi not visualized, oral mucosa unremarkable NECK:  No jugular venous distention, waveform within normal limits, carotid upstroke brisk and symmetric, no bruits, no thyromegaly LYMPHATICS:  No cervical, inguinal adenopathy LUNGS:  Clear to auscultation bilaterally BACK:  No CVA tenderness CHEST:  ICD pocket HEART:  PMI not displaced or sustained,S1 and S2 within normal limits, no S3, no S4, no clicks, no rubs, no murmurs ABD:  Flat, positive bowel sounds normal in frequency in pitch, no bruits, no rebound, no guarding, no midline pulsatile mass, no hepatomegaly, no splenomegaly EXT:  2 plus pulses throughout, no edema, no  cyanosis no clubbing SKIN:  No rashes no nodules NEURO:  Cranial nerves II through XII grossly intact, motor right hemiparesis PSYCH:  Cognitively intact, oriented to person place and time  ASSESSMENT AND PLAN

## 2011-07-20 NOTE — Patient Instructions (Addendum)
The current medical regimen is effective;  continue present plan and medications.  Follow up in 6 months with Dr Hochrein. 

## 2011-07-20 NOTE — Assessment & Plan Note (Signed)
I would like for him to continue the amiodarone unless his thyroid cannot be supplemented while on this medication.  We will await the the next TSH.  I will be happy to further or discontinue this medication after consultation with Dr. Audria Nine.

## 2011-07-23 ENCOUNTER — Ambulatory Visit (INDEPENDENT_AMBULATORY_CARE_PROVIDER_SITE_OTHER): Payer: Medicare Other | Admitting: *Deleted

## 2011-07-23 DIAGNOSIS — I4891 Unspecified atrial fibrillation: Secondary | ICD-10-CM

## 2011-07-23 DIAGNOSIS — Z7901 Long term (current) use of anticoagulants: Secondary | ICD-10-CM

## 2011-07-23 LAB — POCT INR: INR: 1.9

## 2011-07-28 ENCOUNTER — Telehealth: Payer: Self-pay

## 2011-07-28 MED ORDER — ACYCLOVIR 800 MG PO TABS: 800.0000 mg | ORAL_TABLET | Freq: Every day | ORAL | Status: DC

## 2011-07-28 NOTE — Telephone Encounter (Signed)
lmom that rx was sent into pharmacy 

## 2011-07-28 NOTE — Telephone Encounter (Signed)
Pt wants to know the status of when he can expect a refill.

## 2011-07-28 NOTE — Telephone Encounter (Signed)
PT WANTS ACYCLOVIR 200MG  REFILLED.  [phone]

## 2011-07-28 NOTE — Telephone Encounter (Signed)
The patient called to request refill of Acyclovir 200 mg refilled.  The patient states that the pharmacy stated [facility] had not responded to requests to refill. Please call patient at [phone].  The acyclovir Rx in the medication list shows an 800 mg Rx.  Please review with patient.

## 2011-08-02 ENCOUNTER — Other Ambulatory Visit: Payer: Self-pay | Admitting: Internal Medicine

## 2011-08-02 NOTE — Telephone Encounter (Signed)
Fax Received. Refill Completed. Raymond [name] (R.M.A)   

## 2011-08-06 ENCOUNTER — Ambulatory Visit: Payer: Medicare Other | Admitting: Family Medicine

## 2011-08-20 ENCOUNTER — Ambulatory Visit (INDEPENDENT_AMBULATORY_CARE_PROVIDER_SITE_OTHER): Payer: Medicare Other | Admitting: Pharmacist

## 2011-08-20 DIAGNOSIS — I4891 Unspecified atrial fibrillation: Secondary | ICD-10-CM

## 2011-08-20 DIAGNOSIS — Z7901 Long term (current) use of anticoagulants: Secondary | ICD-10-CM

## 2011-08-20 LAB — POCT INR: INR: 1.2

## 2011-09-01 ENCOUNTER — Ambulatory Visit (INDEPENDENT_AMBULATORY_CARE_PROVIDER_SITE_OTHER): Payer: Medicare Other | Admitting: Pharmacist

## 2011-09-01 ENCOUNTER — Telehealth: Payer: Self-pay

## 2011-09-01 DIAGNOSIS — I4891 Unspecified atrial fibrillation: Secondary | ICD-10-CM

## 2011-09-01 DIAGNOSIS — Z7901 Long term (current) use of anticoagulants: Secondary | ICD-10-CM

## 2011-09-01 LAB — POCT INR: INR: 1.5

## 2011-09-01 NOTE — Telephone Encounter (Signed)
Patient came to office for INR.Patient complaining of non productive cough for 1 week and SOB.Stated no edema and no weight gain.States he has been taking lasix 80 mg twice a day for the past 2 days.Spoke to Dr.Hochrein he advised to schedule appointment.Appointment scheduled with Norma Fredrickson NP tomorrow 09/02/11.

## 2011-09-02 ENCOUNTER — Encounter: Payer: Self-pay | Admitting: Nurse Practitioner

## 2011-09-02 ENCOUNTER — Ambulatory Visit (INDEPENDENT_AMBULATORY_CARE_PROVIDER_SITE_OTHER): Payer: Medicare Other | Admitting: Nurse Practitioner

## 2011-09-02 VITALS — BP 126/80 | HR 69 | Ht 69.0 in | Wt 278.4 lb

## 2011-09-02 DIAGNOSIS — R059 Cough, unspecified: Secondary | ICD-10-CM

## 2011-09-02 DIAGNOSIS — R05 Cough: Secondary | ICD-10-CM

## 2011-09-02 DIAGNOSIS — R0609 Other forms of dyspnea: Secondary | ICD-10-CM

## 2011-09-02 DIAGNOSIS — R0600 Dyspnea, unspecified: Secondary | ICD-10-CM

## 2011-09-02 LAB — CBC WITH DIFFERENTIAL/PLATELET
Basophils Absolute: 0.1 10*3/uL (ref 0.0–0.1)
Basophils Relative: 0.7 % (ref 0.0–3.0)
Eosinophils Absolute: 0.4 10*3/uL (ref 0.0–0.7)
Eosinophils Relative: 5.1 % — ABNORMAL HIGH (ref 0.0–5.0)
HCT: 43.4 % (ref 39.0–52.0)
Hemoglobin: 14.3 g/dL (ref 13.0–17.0)
Lymphocytes Relative: 15.9 % (ref 12.0–46.0)
Lymphs Abs: 1.1 10*3/uL (ref 0.7–4.0)
MCHC: 33 g/dL (ref 30.0–36.0)
MCV: 92.9 fl (ref 78.0–100.0)
Monocytes Absolute: 0.7 10*3/uL (ref 0.1–1.0)
Monocytes Relative: 9.7 % (ref 3.0–12.0)
Neutro Abs: 4.8 10*3/uL (ref 1.4–7.7)
Neutrophils Relative %: 68.6 % (ref 43.0–77.0)
Platelets: 247 10*3/uL (ref 150.0–400.0)
RBC: 4.67 Mil/uL (ref 4.22–5.81)
RDW: 14 % (ref 11.5–14.6)
WBC: 7.1 10*3/uL (ref 4.5–10.5)

## 2011-09-02 LAB — BASIC METABOLIC PANEL
BUN: 30 mg/dL — ABNORMAL HIGH (ref 6–23)
CO2: 28 mEq/L (ref 19–32)
Calcium: 9.5 mg/dL (ref 8.4–10.5)
Chloride: 102 mEq/L (ref 96–112)
Creatinine, Ser: 1.6 mg/dL — ABNORMAL HIGH (ref 0.4–1.5)
GFR: 48.39 mL/min — ABNORMAL LOW (ref >60.00)
Glucose, Bld: 107 mg/dL — ABNORMAL HIGH (ref 70–99)
Potassium: 4.5 mEq/L (ref 3.5–5.1)
Sodium: 138 mEq/L (ref 135–145)

## 2011-09-02 LAB — BRAIN NATRIURETIC PEPTIDE: Pro B Natriuretic peptide (BNP): 93 pg/mL (ref 0.0–100.0)

## 2011-09-02 MED ORDER — LOSARTAN POTASSIUM 50 MG PO TABS: 50.0000 mg | ORAL_TABLET | Freq: Every day | ORAL | Status: DC

## 2011-09-02 NOTE — Progress Notes (Signed)
[name]. Date of Birth: [birth year] Medical Record #[id number]  History of Present Illness: [name] is seen today for a work in visit. He is seen for Dr. Antoine Poche. He has a cardiomyopathy and has an ICD in place. EF is around 15%. He has chronic atrial fib, on coumadin. Has had prior CVA. He is also on amiodarone. He has had issues with his thyroid. He is on replacement.   He comes in today. He is here alone. He was here back in June and seemed to be ok. He notes that for the past 2 weeks, he has had a cough. It is a dry, hacky cough. It is worse with talking. It is not productive. His breathing is at his baseline. He is not having any swelling. His weight is actually down a few pounds. He says he has not had any recent labs.   Current Outpatient Prescriptions on File Prior to Visit  Medication Sig Dispense Refill  . acetaminophen (TYLENOL) 325 MG tablet Take 650 mg by mouth every 4 (four) hours as needed.        Marland Kitchen acyclovir (ZOVIRAX) 800 MG tablet Take 1 tablet (800 mg total) by mouth daily. Take 1/2 tablet by mouth twice daily  30 tablet  2  . ALPRAZolam (XANAX) 0.25 MG tablet Take 1 tablet (0.25 mg total) by mouth at bedtime as needed. Need office visit for additional refills.  30 tablet  0  . amiodarone (PACERONE) 200 MG tablet TAKE 2 TABLETS BY MOUTH ONCE DAILY  60 tablet  5  . digoxin (LANOXIN) 0.125 MG tablet TAKE 1 TABLET BY MOUTH ONCE DAILY  30 tablet  6  . furosemide (LASIX) 80 MG tablet Take 80 mg by mouth as needed.       Marland Kitchen HYDROcodone-acetaminophen (NORCO) 7.5-325 MG per tablet Take 1 tablet by mouth every 6 (six) hours as needed.      Marland Kitchen levothyroxine (SYNTHROID, LEVOTHROID) 200 MCG tablet Take 1 tablet (200 mcg total) by mouth daily.  30 tablet  6  . levothyroxine (SYNTHROID, LEVOTHROID) 50 MCG tablet Take 1 tablet (50 mcg total) by mouth daily.  30 tablet  3  . metoprolol (LOPRESSOR) 50 MG tablet TAKE 1 & 1/2 TABLETS BY MOUTH TWICE     DAILY  60 tablet  6  .  spironolactone (ALDACTONE) 50 MG tablet TAKE 1/2 TABLET BY MOUTH ONCE DAILY  15 tablet  6  . warfarin (COUMADIN) 7.5 MG tablet TAKE AS DIRECTED BY ANTICOAGULATION     CLINIC  50 tablet  2  . losartan (COZAAR) 50 MG tablet Take 1 tablet (50 mg total) by mouth daily.  30 tablet  3    No Known Allergies  Past Medical History  Diagnosis Date  . Systolic CHF, chronic   . Non-ischemic cardiomyopathy     echo 11/10: EF 15%, mild MR, mod LAE, mild dec. RVSF, PASP;   cath 10/08: normal cors  . Chronic atrial fibrillation     ON COUMADIN  . Hypertension   . Hypothyroidism     s/p RAI therapy  . Obesity   . Non-compliance   . Dyslexia   . 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 RBBB (right bundle branch block)   . Palpitations     W/WIDE COMPLEX TACHYCARDIA IN THE PAST, REFUSED REPEATED OFFERS OF AN ICD  . Biventricular ICD  st Judes]   .  Obesity     Past Surgical History  Procedure Date  . Insert / replace / remove pacemaker 06/11/09    [employer] [id number] BIVENTRICULAR AICD SERIAL [id number]  . Knee surgery     LEFT X2  . Cardiac catheterization     History  Smoking status  . Never Smoker   Smokeless tobacco  . Not on file    History  Alcohol Use No    Family History  Problem Relation Age of Onset  . Heart disease Father     Review of Systems: The review of systems is per the HPI.  All other systems were reviewed and are negative.  Physical Exam: BP 126/80  Pulse 69  Ht 5\' 9"  (1.753 m)  Wt 278 lb 6.4 oz (126.281 kg)  BMI 41.11 kg/m2 Patient is pleasant and in no acute distress. He is obese. Skin is warm and dry. Color is normal.  HEENT is unremarkable. Normocephalic/atraumatic. PERRL. Sclera are nonicteric. Neck is supple. No masses. No JVD. Lungs are clear. Cardiac exam shows a regular rate and rhythm. Abdomen is obese but soft. Extremities are without any edema. Gait and ROM are  intact. No gross neurologic deficits noted.  LABORATORY DATA: PENDING FOR TODAY  Lab Results  Component Value Date   WBC 8.8 09/11/2010   HGB 14.7 09/11/2010   HCT 43.5 09/11/2010   PLT 244.0 09/11/2010   GLUCOSE 85 04/20/2011   CHOL  Value: 135        ATP III CLASSIFICATION:  <200     mg/dL   Desirable  191-478  mg/dL   Borderline High  >=295    mg/dL   High        62/01/3084   TRIG 118 11/19/2008   HDL 32* 11/19/2008   LDLCALC  Value: 79        Total Cholesterol/HDL:CHD Risk Coronary Heart Disease Risk Table                     Men   Women  1/2 Average Risk   3.4   3.3  Average Risk       5.0   4.4  2 X Average Risk   9.6   7.1  3 X Average Risk  23.4   11.0        Use the calculated Patient Ratio above and the CHD Risk Table to determine the patient's CHD Risk.        ATP III CLASSIFICATION (LDL):  <100     mg/dL   Optimal  578-469  mg/dL   Near or Above                    Optimal  130-159  mg/dL   Borderline  629-528  mg/dL   High  >413     mg/dL   Very High 24/04/100   ALT 30 04/20/2011   AST 24 04/20/2011   NA 138 04/20/2011   K 4.7 04/20/2011   CL 101 04/20/2011   CREATININE 1.2 04/20/2011   BUN 17 04/20/2011   CO2 30 04/20/2011   TSH 8.016* 06/25/2011   INR 1.5 09/01/2011   HGBA1C  Value: 5.8 (NOTE) The ADA recommends the following therapeutic goal for glycemic control related to Hgb A1c measurement: Goal of therapy: <6.5 Hgb A1c  Reference: American Diabetes Association: Clinical Practice Recommendations 2010, Diabetes Care, 2010, 33: (Suppl  1). 11/18/2008    Assessment / Plan:

## 2011-09-02 NOTE — Patient Instructions (Addendum)
Stop your Enalapril. This may be why you are coughing  Start Losartan 50 mg daily.   We are going to check labs today.  If your cough does not get better, let us know  See Dr. Antoine Poche in December.   Patient is agreeable to this plan and will call if any problems develop in the interim.

## 2011-09-02 NOTE — Assessment & Plan Note (Signed)
He reports a dry, hacky cough. It is unproductive. He denies fever or chills. He does not appear to be volume overloaded but I am going to check a BMET and BNP today. He is on ACE. I have stopped the Vasotec and switched him to Losartan 50 mg a day. If he does not see improvement in the next few days, he is to be back in touch with [place] and we will need to proceed with further testing. For now, will see him back in December as planned. Patient is agreeable to this plan and will call if any problems develop in the interim.

## 2011-09-06 ENCOUNTER — Telehealth: Payer: Self-pay | Admitting: Cardiology

## 2011-09-06 NOTE — Telephone Encounter (Signed)
Pt needs to discuss the medication he was given at last PA visit because it causes him dizziness and he wants to get another med

## 2011-09-06 NOTE — Telephone Encounter (Signed)
Pt was started on Losartan 50 mg at last office visit 8/15 and enalapril stopped due to cough.  Pt is complaining of dizziness with standing that last a few seconds after standing.  Pt thinks he may have been on losartan before and was not able to tolerate it however I have not been able to find any documentation of this.  Pt states he does not have a way to check his blood pressure.  Will forward to Dr Antoine Poche for recommendations

## 2011-09-07 ENCOUNTER — Telehealth: Payer: Self-pay | Admitting: *Deleted

## 2011-09-07 NOTE — Telephone Encounter (Signed)
Pt will come in for orthostatic blood pressure on Thrusday.  Review recent blood work with pt.

## 2011-09-07 NOTE — Telephone Encounter (Signed)
Message copied by Awilda Bill on Tue Sep 07, 2011  9:27 AM ------      Message from: Rosalio Macadamia      Created: Fri Sep 03, 2011  7:46 AM       Ok to report. Labs are satisfactory. His kidney function is elevated. He admitted that he had taken some extra fluid pills. BNP is normal.

## 2011-09-07 NOTE — Telephone Encounter (Signed)
Can he come in for a nurse BP check with orthostatic BPs?

## 2011-09-07 NOTE — Telephone Encounter (Signed)
Patient returned call and spoke with Raymond [name] regarding lab results.  Pt notified.  Vista Mink, CMA

## 2011-09-07 NOTE — Telephone Encounter (Signed)
LMTCB, Vista Mink, CMA

## 2011-09-09 ENCOUNTER — Ambulatory Visit (INDEPENDENT_AMBULATORY_CARE_PROVIDER_SITE_OTHER): Payer: Medicare Other | Admitting: Cardiology

## 2011-09-09 ENCOUNTER — Ambulatory Visit (INDEPENDENT_AMBULATORY_CARE_PROVIDER_SITE_OTHER): Payer: Medicare Other

## 2011-09-09 VITALS — BP 110/80 | HR 70

## 2011-09-09 DIAGNOSIS — Z7901 Long term (current) use of anticoagulants: Secondary | ICD-10-CM

## 2011-09-09 DIAGNOSIS — R0989 Other specified symptoms and signs involving the circulatory and respiratory systems: Secondary | ICD-10-CM

## 2011-09-09 DIAGNOSIS — I4891 Unspecified atrial fibrillation: Secondary | ICD-10-CM

## 2011-09-09 DIAGNOSIS — R42 Dizziness and giddiness: Secondary | ICD-10-CM

## 2011-09-09 LAB — POCT INR: INR: 3.1

## 2011-09-09 MED ORDER — METOPROLOL TARTRATE 50 MG PO TABS: 50.0000 mg | ORAL_TABLET | Freq: Two times a day (BID) | ORAL | Status: DC

## 2011-09-09 NOTE — Progress Notes (Signed)
Pt here for orthostatic bp check due to c/o dizziness when standing since starting losartan 50 mg daily. Pt did not take his meds today. Discussed blood pressures with dr hochrein, pt instructed to decrease metoprolol to 50 mg one tablet bid. Pt voiced understanding, he will call if he cont to have problems with dizziness.

## 2011-09-27 ENCOUNTER — Other Ambulatory Visit: Payer: Self-pay | Admitting: Cardiology

## 2011-09-29 ENCOUNTER — Ambulatory Visit (INDEPENDENT_AMBULATORY_CARE_PROVIDER_SITE_OTHER): Payer: Medicare Other | Admitting: *Deleted

## 2011-09-29 DIAGNOSIS — Z7901 Long term (current) use of anticoagulants: Secondary | ICD-10-CM

## 2011-09-29 DIAGNOSIS — I4891 Unspecified atrial fibrillation: Secondary | ICD-10-CM

## 2011-09-30 ENCOUNTER — Telehealth: Payer: Self-pay | Admitting: Cardiology

## 2011-09-30 NOTE — Telephone Encounter (Signed)
Left message for pt to call.

## 2011-09-30 NOTE — Telephone Encounter (Signed)
Could be the cozaar but I doubt it given how fast the symptoms change.  If he wants we can switch to Diovan 80 mg daily disp number 31 with 11 refills.  Stop Cozaar.

## 2011-09-30 NOTE — Telephone Encounter (Signed)
Spoke with pt, he has a dry cough. He reports he stopped all his meds for one week and the cough went away. He restarted all his meds yesterday and the cough has come back. He feels this is related to losartan because the bottle says it may cause a cough. Pt wants to change to a different med. Will forward for dr hochrein review.

## 2011-09-30 NOTE — Telephone Encounter (Signed)
PT CALLING RE BP MED MAKING HIM COUGH, CAN STOP OR GET IT CHANGED?

## 2011-10-01 ENCOUNTER — Telehealth: Payer: Self-pay | Admitting: Cardiology

## 2011-10-01 MED ORDER — VALSARTAN 80 MG PO TABS: 80.0000 mg | ORAL_TABLET | Freq: Every day | ORAL | Status: DC

## 2011-10-01 NOTE — Telephone Encounter (Signed)
Left message for pt to call.

## 2011-10-01 NOTE — Telephone Encounter (Signed)
Spoke with pt, aware of med change. He will call with problems.

## 2011-10-01 NOTE — Telephone Encounter (Signed)
Please return call to patient [phone]  New RX was called in for patient, who said he will not take it as he cannot afford to pay $50 on disability .

## 2011-10-01 NOTE — Telephone Encounter (Signed)
Called Patient to see if he had enough of Diovan to last until Tuesday 10/05/2011 when Dr Antoine Poche will be back

## 2011-10-13 ENCOUNTER — Ambulatory Visit (INDEPENDENT_AMBULATORY_CARE_PROVIDER_SITE_OTHER): Payer: Medicare Other | Admitting: *Deleted

## 2011-10-13 DIAGNOSIS — I4891 Unspecified atrial fibrillation: Secondary | ICD-10-CM

## 2011-10-13 DIAGNOSIS — Z7901 Long term (current) use of anticoagulants: Secondary | ICD-10-CM

## 2011-10-13 LAB — POCT INR: INR: 2.6

## 2011-10-14 ENCOUNTER — Telehealth: Payer: Self-pay | Admitting: *Deleted

## 2011-10-14 NOTE — Telephone Encounter (Signed)
Pharmacy contacted [place] via fax that pt's copay for Diovan is $50 and pt can not afford this.  Called back and left a message for [facility] in [place] to help determine which if any medications in this classification has a lower co-pay that pt can afford.  Requested they call back to discuss.

## 2011-10-15 ENCOUNTER — Other Ambulatory Visit: Payer: Self-pay | Admitting: *Deleted

## 2011-10-15 NOTE — Telephone Encounter (Signed)
F/u   Pharmacy tech [place] from [facility] [phone] returning nurse call to fill patient RX.  plz return call

## 2011-10-15 NOTE — Telephone Encounter (Signed)
Left a message for the pt to decide if he is going to take Losartan or Diovan and let us know.

## 2011-10-20 ENCOUNTER — Other Ambulatory Visit: Payer: Self-pay | Admitting: *Deleted

## 2011-10-27 ENCOUNTER — Ambulatory Visit (INDEPENDENT_AMBULATORY_CARE_PROVIDER_SITE_OTHER): Payer: Medicare Other | Admitting: Pharmacist

## 2011-10-27 DIAGNOSIS — I4891 Unspecified atrial fibrillation: Secondary | ICD-10-CM

## 2011-10-27 DIAGNOSIS — Z7901 Long term (current) use of anticoagulants: Secondary | ICD-10-CM

## 2011-11-09 ENCOUNTER — Other Ambulatory Visit: Payer: Self-pay | Admitting: Radiology

## 2011-11-09 MED ORDER — ACYCLOVIR 800 MG PO TABS: 800.0000 mg | ORAL_TABLET | Freq: Every day | ORAL | Status: DC

## 2011-11-17 ENCOUNTER — Encounter: Payer: Self-pay | Admitting: Internal Medicine

## 2011-11-17 ENCOUNTER — Ambulatory Visit (INDEPENDENT_AMBULATORY_CARE_PROVIDER_SITE_OTHER): Payer: Medicare Other | Admitting: *Deleted

## 2011-11-17 DIAGNOSIS — Z7901 Long term (current) use of anticoagulants: Secondary | ICD-10-CM

## 2011-11-17 DIAGNOSIS — I428 Other cardiomyopathies: Secondary | ICD-10-CM

## 2011-11-17 DIAGNOSIS — I4891 Unspecified atrial fibrillation: Secondary | ICD-10-CM

## 2011-11-17 DIAGNOSIS — I5022 Chronic systolic (congestive) heart failure: Secondary | ICD-10-CM

## 2011-11-17 LAB — ICD DEVICE OBSERVATION
AL IMPEDENCE ICD: 412.5 Ohm
ATRIAL PACING ICD: 36 pct
BAMS-0001: 170 {beats}/min
LV LEAD IMPEDENCE ICD: 950 Ohm
LV LEAD THRESHOLD: 0.5 V
MODE SWITCH EPISODES: 102
RV LEAD THRESHOLD: 0.5 V
TOT-0007: 1
TOT-0008: 0
TZAT-0004FASTVT: 8
TZAT-0013FASTVT: 2
TZAT-0018FASTVT: NEGATIVE
TZON-0003FASTVT: 300 ms
TZON-0003SLOWVT: 350 ms
TZON-0010FASTVT: 80 ms
TZON-0010SLOWVT: 80 ms
TZST-0001FASTVT: 2
TZST-0001FASTVT: 3
TZST-0001FASTVT: 5
TZST-0003FASTVT: 40 J
TZST-0003FASTVT: 40 J
VENTRICULAR PACING ICD: 97 pct

## 2011-11-17 NOTE — Progress Notes (Signed)
ICD check with [employer] 

## 2011-11-17 NOTE — Patient Instructions (Addendum)
Return office visit 02/14/12 @11 :45am with Dr. Graciela Husbands.

## 2011-12-15 ENCOUNTER — Ambulatory Visit (INDEPENDENT_AMBULATORY_CARE_PROVIDER_SITE_OTHER): Payer: Medicare Other | Admitting: *Deleted

## 2011-12-15 DIAGNOSIS — I4891 Unspecified atrial fibrillation: Secondary | ICD-10-CM

## 2011-12-15 DIAGNOSIS — Z7901 Long term (current) use of anticoagulants: Secondary | ICD-10-CM

## 2011-12-15 LAB — POCT INR: INR: 1.3

## 2011-12-29 ENCOUNTER — Ambulatory Visit (INDEPENDENT_AMBULATORY_CARE_PROVIDER_SITE_OTHER): Payer: Medicare Other | Admitting: *Deleted

## 2011-12-29 DIAGNOSIS — Z7901 Long term (current) use of anticoagulants: Secondary | ICD-10-CM

## 2011-12-29 DIAGNOSIS — I4891 Unspecified atrial fibrillation: Secondary | ICD-10-CM

## 2011-12-29 LAB — POCT INR: INR: 1.6

## 2012-01-03 ENCOUNTER — Telehealth: Payer: Self-pay | Admitting: Cardiology

## 2012-01-03 NOTE — Telephone Encounter (Signed)
New problem:   Patient calling was seen in the office week for coumadin check. Need clarification on which medication that he was suppose to changed to one time a day.

## 2012-01-03 NOTE — Telephone Encounter (Signed)
I spoke with the pt and he wanted to know which one of his medications was decreased to once a day since his appointment in August.  I reviewed his chart and made him aware that I do not see any medications that were changed to daily.  The pt's Enalapril was stopped and the pt was started on Losartan 50mg  daily which he is taking.  The only other change was metoprolol was decreased to 50mg  twice a day.  The pt will review his medications and make sure that he is taking this medication correctly.

## 2012-01-06 ENCOUNTER — Ambulatory Visit (INDEPENDENT_AMBULATORY_CARE_PROVIDER_SITE_OTHER): Payer: Medicare Other

## 2012-01-06 DIAGNOSIS — Z7901 Long term (current) use of anticoagulants: Secondary | ICD-10-CM

## 2012-01-06 DIAGNOSIS — I4891 Unspecified atrial fibrillation: Secondary | ICD-10-CM

## 2012-01-06 LAB — POCT INR: INR: 2.6

## 2012-01-18 ENCOUNTER — Encounter: Payer: Self-pay | Admitting: Cardiology

## 2012-01-18 ENCOUNTER — Ambulatory Visit (INDEPENDENT_AMBULATORY_CARE_PROVIDER_SITE_OTHER)
Admission: RE | Admit: 2012-01-18 | Discharge: 2012-01-18 | Disposition: A | Discharge status: Home / Self Care | Payer: Medicare Other | Source: Ambulatory Visit | Attending: Cardiology | Admitting: Cardiology

## 2012-01-18 ENCOUNTER — Ambulatory Visit (INDEPENDENT_AMBULATORY_CARE_PROVIDER_SITE_OTHER): Payer: Medicare Other | Admitting: Cardiology

## 2012-01-18 VITALS — BP 122/80 | HR 76 | Ht 69.0 in | Wt 302.4 lb

## 2012-01-18 DIAGNOSIS — Z79899 Other long term (current) drug therapy: Secondary | ICD-10-CM

## 2012-01-18 DIAGNOSIS — Z95810 Presence of automatic (implantable) cardiac defibrillator: Secondary | ICD-10-CM

## 2012-01-18 DIAGNOSIS — I5022 Chronic systolic (congestive) heart failure: Secondary | ICD-10-CM

## 2012-01-18 DIAGNOSIS — R5383 Other fatigue: Secondary | ICD-10-CM

## 2012-01-18 DIAGNOSIS — R5381 Other malaise: Secondary | ICD-10-CM

## 2012-01-18 LAB — COMPREHENSIVE METABOLIC PANEL
AST: 22 U/L (ref 0–37)
Albumin: 3.7 g/dL (ref 3.5–5.2)
Alkaline Phosphatase: 67 U/L (ref 39–117)
BUN: 17 mg/dL (ref 6–23)
Potassium: 4.3 mEq/L (ref 3.5–5.1)
Total Bilirubin: 0.6 mg/dL (ref 0.3–1.2)

## 2012-01-18 LAB — T3, FREE: T3, Free: 1.7 pg/mL — ABNORMAL LOW (ref 2.3–4.2)

## 2012-01-18 NOTE — Progress Notes (Signed)
HPI The patient returns for followup of his cardiomyopathy.  Since I last saw him he continues to exercise doing water aerobics daily. However, he says he's had some decreased exercise tolerance with this. He also says that despite eating very little he has gained weight. He is very disappointed about this. He might get more short of breath with activities but this is after a moderate activity. He denies any chest pressure, neck or arm discomfort. He has had some pain under the right rib cage with certain breathing or movement. He's not describing any PND or orthopnea. He has not felt any palpitations, presyncope or syncope.  Allergies  Allergen Reactions  . Losartan Cough  . Valsartan Cough    Current Outpatient Prescriptions  Medication Sig Dispense Refill  . acetaminophen (TYLENOL) 325 MG tablet Take 650 mg by mouth every 4 (four) hours as needed.        Marland Kitchen acyclovir (ZOVIRAX) 800 MG tablet Take 1 tablet (800 mg total) by mouth daily. Take 1/2 tablet by mouth twice daily  30 tablet  2  . ALPRAZolam (XANAX) 0.25 MG tablet Take 1 tablet (0.25 mg total) by mouth at bedtime as needed. Need office visit for additional refills.  30 tablet  0  . amiodarone (PACERONE) 200 MG tablet       . digoxin (LANOXIN) 0.125 MG tablet TAKE 1 TABLET BY MOUTH ONCE DAILY  30 tablet  6  . furosemide (LASIX) 80 MG tablet Take 80 mg by mouth as needed.       Marland Kitchen levothyroxine (SYNTHROID, LEVOTHROID) 200 MCG tablet Take 1 tablet (200 mcg total) by mouth daily.  30 tablet  6  . levothyroxine (SYNTHROID, LEVOTHROID) 50 MCG tablet Take 1 tablet (50 mcg total) by mouth daily.  30 tablet  3  . losartan (COZAAR) 50 MG tablet Take 1 tablet (50 mg total) by mouth daily.  1 tablet  0  . metoprolol (LOPRESSOR) 50 MG tablet Take 1 tablet (50 mg total) by mouth 2 (two) times daily.  60 tablet  6  . spironolactone (ALDACTONE) 50 MG tablet TAKE 1/2 TABLET BY MOUTH ONCE DAILY  15 tablet  6  . warfarin (COUMADIN) 7.5 MG tablet  TAKE AS DIRECTED BY ANTICOAGULATION     CLINIC  50 tablet  3    Past Medical History  Diagnosis Date  . Systolic CHF, chronic   . Non-ischemic cardiomyopathy     echo 11/10: EF 15%, mild MR, mod LAE, mild dec. RVSF, PASP;   cath 10/08: normal cors  . Chronic atrial fibrillation     ON COUMADIN  . Hypertension   . Hypothyroidism     s/p RAI therapy  . Obesity   . Non-compliance   . Dyslexia   . 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 RBBB (right bundle branch block)   . Palpitations     W/WIDE COMPLEX TACHYCARDIA IN THE PAST, REFUSED REPEATED OFFERS OF AN ICD  . Biventricular ICD  [employer]]   . Obesity     Past Surgical History  Procedure Date  . Insert / replace / remove pacemaker 06/11/09    [employer] [id number] BIVENTRICULAR AICD SERIAL [id number]  . Knee surgery     LEFT X2  . Cardiac catheterization     ROS:  As stated in the HPI and negative for all other systems.  PHYSICAL EXAM  BP 122/80  Pulse 76  Ht 5\' 9"  (1.753 m)  Wt 302 lb 6.4 oz (137.168 kg)  BMI 44.66 kg/m2 GENERAL:  Well appearing HEENT:  Pupils equal round and reactive, fundi not visualized, oral mucosa unremarkable NECK:  No jugular venous distention, waveform within normal limits, carotid upstroke brisk and symmetric, no bruits, no thyromegaly LYMPHATICS:  No cervical, inguinal adenopathy LUNGS:  Clear to auscultation bilaterally BACK:  No CVA tenderness CHEST:  ICD pocket HEART:  PMI not displaced or sustained,S1 and S2 within normal limits, no S3, no S4, no clicks, no rubs, no murmurs ABD:  Flat, positive bowel sounds normal in frequency in pitch, no bruits, no rebound, no guarding, no midline pulsatile mass, no hepatomegaly, no splenomegaly EXT:  2 plus pulses throughout, no edema, no cyanosis no clubbing SKIN:  No rashes no nodules NEURO:  Cranial nerves II through XII grossly intact, motor right hemiparesis PSYCH:   Cognitively intact, oriented to person place and time  ASSESSMENT AND PLAN  Chronic systolic heart failure -  He seems to be euvolemic. At this point, no change in therapy is indicated. I will consider a repeat echo at next visit.  FIBRILLATION, ATRIAL -  I would like for him to continue the amiodarone.  However, I will check a chest x-ray and also his surveillance labs given the mild dyspnea but he is having and the weight gain.  Hypothyroidism - I will check a T3-T4 and TSH. This could possibly explain his weight gain.  Obesity - As above.   I do believe that he is watching his diet.

## 2012-01-18 NOTE — Patient Instructions (Addendum)
The current medical regimen is effective;  continue present plan and medications.  Please have blood work today (CMP, TSH, T3 and T4)  Please have a chest X-Ray today at the [facility] office.  Follow up in 1 month with Tereso Newcomer, PA.

## 2012-01-20 ENCOUNTER — Other Ambulatory Visit: Payer: Self-pay | Admitting: *Deleted

## 2012-01-20 DIAGNOSIS — E039 Hypothyroidism, unspecified: Secondary | ICD-10-CM

## 2012-01-21 ENCOUNTER — Encounter: Payer: Self-pay | Admitting: Internal Medicine

## 2012-01-24 ENCOUNTER — Encounter: Payer: Self-pay | Admitting: Internal Medicine

## 2012-01-26 ENCOUNTER — Ambulatory Visit (INDEPENDENT_AMBULATORY_CARE_PROVIDER_SITE_OTHER): Payer: Medicare Other | Admitting: *Deleted

## 2012-01-26 DIAGNOSIS — I4891 Unspecified atrial fibrillation: Secondary | ICD-10-CM

## 2012-01-26 DIAGNOSIS — Z7901 Long term (current) use of anticoagulants: Secondary | ICD-10-CM

## 2012-01-26 LAB — POCT INR: INR: 2.7

## 2012-02-01 ENCOUNTER — Encounter: Payer: Medicare Other | Admitting: Internal Medicine

## 2012-02-07 ENCOUNTER — Other Ambulatory Visit: Payer: Self-pay | Admitting: Cardiology

## 2012-02-07 ENCOUNTER — Telehealth: Payer: Self-pay | Admitting: Cardiology

## 2012-02-07 NOTE — Telephone Encounter (Signed)
Per pharmacy RX will be 36.09$ cash price or more with insurance.  They can fill with 0.25 mg tablets for pt to take 1/2  Tablet daily and the price will be $19.42.  Per Dr Antoine Poche - this is OK to do. Pt aware and states he should be able to afford this

## 2012-02-07 NOTE — Telephone Encounter (Signed)
New problem:    The cost of digoxin is too high - $ 40.00  Need a gentric or cheaper version. Please advise.

## 2012-02-14 ENCOUNTER — Encounter: Payer: Medicare Other | Admitting: Internal Medicine

## 2012-02-15 ENCOUNTER — Ambulatory Visit: Payer: Medicare Other | Admitting: Physician Assistant

## 2012-02-15 ENCOUNTER — Ambulatory Visit (INDEPENDENT_AMBULATORY_CARE_PROVIDER_SITE_OTHER): Payer: Medicare Other | Admitting: *Deleted

## 2012-02-15 ENCOUNTER — Ambulatory Visit (INDEPENDENT_AMBULATORY_CARE_PROVIDER_SITE_OTHER): Payer: Medicare Other | Admitting: Endocrinology

## 2012-02-15 ENCOUNTER — Encounter: Payer: Self-pay | Admitting: Endocrinology

## 2012-02-15 ENCOUNTER — Encounter: Payer: Self-pay | Admitting: Physician Assistant

## 2012-02-15 ENCOUNTER — Ambulatory Visit (INDEPENDENT_AMBULATORY_CARE_PROVIDER_SITE_OTHER): Payer: Medicare Other | Admitting: Physician Assistant

## 2012-02-15 VITALS — BP 122/98 | HR 80 | Ht 69.0 in | Wt 297.6 lb

## 2012-02-15 VITALS — BP 136/70 | HR 84 | Wt 299.0 lb

## 2012-02-15 DIAGNOSIS — Z7901 Long term (current) use of anticoagulants: Secondary | ICD-10-CM

## 2012-02-15 DIAGNOSIS — I5022 Chronic systolic (congestive) heart failure: Secondary | ICD-10-CM

## 2012-02-15 DIAGNOSIS — I4891 Unspecified atrial fibrillation: Secondary | ICD-10-CM

## 2012-02-15 DIAGNOSIS — E890 Postprocedural hypothyroidism: Secondary | ICD-10-CM | POA: Insufficient documentation

## 2012-02-15 DIAGNOSIS — E039 Hypothyroidism, unspecified: Secondary | ICD-10-CM

## 2012-02-15 DIAGNOSIS — Z95810 Presence of automatic (implantable) cardiac defibrillator: Secondary | ICD-10-CM

## 2012-02-15 MED ORDER — LEVOTHYROXINE SODIUM 300 MCG PO TABS: 300.0000 ug | ORAL_TABLET | Freq: Every day | ORAL | Status: DC

## 2012-02-15 NOTE — Progress Notes (Signed)
[address], [address] [place], [place]  [address] Phone: [phone], Fax:  [phone]  Date:  02/15/2012   ID:  Raymond Nooney., DOB 1960/11/19, MRN [id number]  PCP:  Dow Adolph, MD  Primary Cardiologist:  Dr. [name]     History of Present Illness: Raymond Wheatley. is a 52 y.o. male who returns for followup. 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 Myoview 5/12: Very mild distal anterior and apical ischemia, EF 17% (low risk-medical therapy continued). Echo 8/12: EF 20-25%, diffuse HK, grade 1 diastolic dysfunction, mild LAE. He was seen by Dr. Antoine Poche 01/19/12. He noted weight gain and shortness of breath. Volume status appeared to be stable. He is on amiodarone. TFTs were obtained. TSH was very high and has been referred back to endocrinology. He saw Dr. Everardo [name] today. He recommended increasing his Synthroid and felt that he could remain on amiodarone.  His weight is down.  Denies chest pain, syncope, near syncope, orthopnea, PND.  LE edema stable.  No significant dyspnea.  He is NYHA Class IIb.    Labs (1/14):   K 4.3, creatinine 1.5, TSH 38.76, T4 0.42, T3 1.7  Wt Readings from Last 3 Encounters:  02/15/12 297 lb 9.6 oz (134.99 kg)  02/15/12 299 lb (135.626 kg)  01/18/12 302 lb 6.4 oz (137.168 kg)     Past Medical History  Diagnosis Date  . Systolic CHF, chronic   . Non-ischemic cardiomyopathy     echo 11/10: EF 15%, mild MR, mod LAE, mild dec. RVSF, PASP;   cath 10/08: normal cors; Echo 8/12: EF 20-25%, diffuse HK, grade 1 diastolic dysfunction, mild LAE; Myoview 5/12: Very mild distal anterior and apical ischemia, EF 17% (low risk-medical therapy continued)    . Atrial fibrillation     ON COUMADIN  . Hypertension   . Hypothyroidism     s/p RAI therapy  . Obesity   . Non-compliance   . Dyslexia   .  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 RBBB (right bundle branch block)   . Palpitations     W/WIDE COMPLEX TACHYCARDIA IN THE PAST, REFUSED REPEATED OFFERS OF AN ICD  . Biventricular ICD  st Judes]   . Obesity     Current Outpatient Prescriptions  Medication Sig Dispense Refill  . acetaminophen (TYLENOL) 325 MG tablet Take 650 mg by mouth every 4 (four) hours as needed.        Marland Kitchen acyclovir (ZOVIRAX) 800 MG tablet Take 1 tablet (800 mg total) by mouth daily. Take 1/2 tablet by mouth twice daily  30 tablet  2  . ALPRAZolam (XANAX) 0.25 MG tablet Take 1 tablet (0.25 mg total) by mouth at bedtime as needed. Need office visit for additional refills.  30 tablet  0  . amiodarone (PACERONE) 200 MG tablet       . DIGOX 0.125 MG tablet TAKE 1 TABLET BY MOUTH ONCE DAILY  30 tablet  5  . furosemide (LASIX) 80 MG tablet Take 80 mg by mouth as needed.       Marland Kitchen levothyroxine (SYNTHROID, LEVOTHROID) 300 MCG tablet Take 1 tablet (300 mcg total) by mouth daily.  30 tablet  2  . losartan (COZAAR) 50 MG tablet Take 1 tablet (50 mg total) by mouth daily.  1  tablet  0  . metoprolol (LOPRESSOR) 50 MG tablet Take 1 tablet (50 mg total) by mouth 2 (two) times daily.  60 tablet  6  . spironolactone (ALDACTONE) 50 MG tablet TAKE 1/2 TABLET BY MOUTH ONCE DAILY  15 tablet  6  . warfarin (COUMADIN) 7.5 MG tablet TAKE AS DIRECTED BY ANTICOAGULATION     CLINIC  50 tablet  3    Allergies:    Allergies  Allergen Reactions  . Losartan Cough  . Valsartan Cough    Social History:  The patient  reports that he has never smoked. He does not have any smokeless tobacco history on file. He reports that he does not drink alcohol or use illicit drugs.    PHYSICAL EXAM: VS:  BP 122/98  Pulse 80  Ht 5\' 9"  (1.753 m)  Wt 297 lb 9.6 oz (134.99 kg)  BMI 43.95 kg/m2 Well nourished, well developed, in no acute distress HEENT: normal Neck: no JVD Cardiac:   normal S1, S2; RRR; no murmur Lungs:  clear to auscultation bilaterally, no wheezing, rhonchi or rales Abd: soft, nontender, no hepatomegaly Ext: trace bilateral LE edema Skin: warm and dry Neuro:  CNs 2-12 intact, no focal abnormalities noted  EKG:  V-pacing, HR 80  ASSESSMENT AND PLAN:  1. Chronic Systolic CHF:  Volume stable.  Recent weight gain and fatigue likely from uncontrolled hypothyroidism.  No change in rx today.  Continue current regimen with beta blocker, ARB, digoxin, spironolactone. 2. Atrial Fibrillation:  Maintaining NSR.  Dr. Everardo [name] felt he could remain on amiodarone.  Continue coumadin.  Recent LFTs ok.  3. S/p CRT-D:  His device is being interrogated today.  4. Hypothyroidism:  Per endocrinology. 5. Disposition:  Follow up with me in 2-3 mos.  Consider echo at that time.   Luna Glasgow, PA-C  11:32 AM 02/15/2012

## 2012-02-15 NOTE — Progress Notes (Signed)
Subjective:    Patient ID: Raymond [name]., male    DOB: [birth year], 52 y.o.   MRN: [id number]  HPI Pt states he was dx'ed with hyperthyroidism 15 years ago. He describes what sounds like i-131 rx, approx 10 year ago. He has been on amiodarone x approx 10 years.  He has required serial adjustments in his synthroid over the past few years.  He reports moderate cold intolerance, and assoc weight gain. Past Medical History  Diagnosis Date  . Systolic CHF, chronic   . Non-ischemic cardiomyopathy     echo 11/10: EF 15%, mild MR, mod LAE, mild dec. RVSF, PASP;   cath 10/08: normal cors  . Chronic atrial fibrillation     ON COUMADIN  . Hypertension   . Hypothyroidism     s/p RAI therapy  . Obesity   . Non-compliance   . Dyslexia   . 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 RBBB (right bundle branch block)   . Palpitations     W/WIDE COMPLEX TACHYCARDIA IN THE PAST, REFUSED REPEATED OFFERS OF AN ICD  . Biventricular ICD  st Judes]   . Obesity     Past Surgical History  Procedure Date  . Insert / replace / remove pacemaker 06/11/09    [employer] [id number] BIVENTRICULAR AICD SERIAL [phone]  . Knee surgery     LEFT X2  . Cardiac catheterization     History   Social History  . Marital Status: Legally Separated    Spouse Name: N/A    Number of Children: N/A  . Years of Education: N/A   Occupational History  . DISABLED     HOWEVER, HE DOES WORK A LITTLE ON CARS   Social History Main Topics  . Smoking status: Never Smoker   . Smokeless tobacco: Not on file  . Alcohol Use: No  . Drug Use: No  . Sexually Active: Not on file   Other Topics Concern  . Not on file   Social History Narrative   LIVES ALONE IN [place], NCDISABLED HOWEVER, DOES WORK A LITTLE ON CARSNO ETOHNO TOBACCONO DRUG ABUSE    Current Outpatient Prescriptions on File Prior to Visit  Medication Sig Dispense Refill  .  acetaminophen (TYLENOL) 325 MG tablet Take 650 mg by mouth every 4 (four) hours as needed.        Marland Kitchen acyclovir (ZOVIRAX) 800 MG tablet Take 1 tablet (800 mg total) by mouth daily. Take 1/2 tablet by mouth twice daily  30 tablet  2  . ALPRAZolam (XANAX) 0.25 MG tablet Take 1 tablet (0.25 mg total) by mouth at bedtime as needed. Need office visit for additional refills.  30 tablet  0  . amiodarone (PACERONE) 200 MG tablet       . DIGOX 0.125 MG tablet TAKE 1 TABLET BY MOUTH ONCE DAILY  30 tablet  5  . furosemide (LASIX) 80 MG tablet Take 80 mg by mouth as needed.       Marland Kitchen levothyroxine (SYNTHROID, LEVOTHROID) 200 MCG tablet Take 1 tablet (200 mcg total) by mouth daily.  30 tablet  6  . levothyroxine (SYNTHROID, LEVOTHROID) 50 MCG tablet Take 1 tablet (50 mcg total) by mouth daily.  30 tablet  3  . losartan (COZAAR) 50 MG tablet Take 1 tablet (50 mg total) by mouth daily.  1 tablet  0  . metoprolol (LOPRESSOR) 50 MG tablet  Take 1 tablet (50 mg total) by mouth 2 (two) times daily.  60 tablet  6  . spironolactone (ALDACTONE) 50 MG tablet TAKE 1/2 TABLET BY MOUTH ONCE DAILY  15 tablet  6  . warfarin (COUMADIN) 7.5 MG tablet TAKE AS DIRECTED BY ANTICOAGULATION     CLINIC  50 tablet  3    Allergies  Allergen Reactions  . Losartan Cough  . Valsartan Cough    Family History  Problem Relation Age of Onset  . Heart disease Father   mother had thyroid cancer  BP 136/70  Pulse 84  Wt 299 lb (135.626 kg)  SpO2 97%    Review of Systems denies depression, hair loss, cramps, sob, fever, constipation, numbness, blurry vision, myalgias, and syncope.  He attributes difficulty with concentration, to his cva, and easy bruising to coumadin.  He has dry skin and rhinorrhea.    Objective:   Physical Exam VS: see vs page GEN: no distress HEAD: head: no deformity eyes: no periorbital swelling, no proptosis external nose and ears are normal mouth: no lesion seen NECK: supple, thyroid is not  enlarged CHEST WALL: no deformity LUNGS: clear to auscultation BREASTS:  pseudogynecomastia CV: reg rate and rhythm, no murmur ABD: abdomen is soft, nontender.  no hepatosplenomegaly.  not distended.  no hernia MUSCULOSKELETAL: muscle bulk and strength are grossly normal.  no obvious joint swelling.  gait is normal and steady EXTEMITIES: no deformity.  no ulcer on the feet.  feet are of normal color and temp.  no edema PULSES: dorsalis pedis intact bilat.  no carotid bruit NEURO:  cn 2-12 grossly intact.   readily moves all 4's.  sensation is intact to touch on the feet.  No tremor. SKIN:  Normal texture and temperature.  No rash or suspicious lesion is visible.   NODES:  None palpable at the neck PSYCH: alert, oriented x3.  Does not appear anxious nor depressed.  Lab Results  Component Value Date   TSH 38.76* 01/18/2012   T3TOTAL 64.1* 07/30/2009   T4TOTAL 5.7 07/23/2009      Assessment & Plan:  Post-i-131 hypothyroidism, on rx  AF, on amiodarone.  This slows T4 to T3 conversion, so a higher dosage of synthroid is needed. There is no need to stop the amiodarone  Weight gain, possibly due to hypothyroidism

## 2012-02-15 NOTE — Patient Instructions (Addendum)
Your physician recommends that you schedule a follow-up appointment in: 2-3 months with Tereso Newcomer

## 2012-02-15 NOTE — Patient Instructions (Addendum)
Please increase the levothyroxine to 300 mcg/day. Please come back for a follow-up appointment for 1 month.

## 2012-03-14 ENCOUNTER — Ambulatory Visit (INDEPENDENT_AMBULATORY_CARE_PROVIDER_SITE_OTHER): Payer: Medicare Other

## 2012-03-14 DIAGNOSIS — I4891 Unspecified atrial fibrillation: Secondary | ICD-10-CM

## 2012-03-14 DIAGNOSIS — Z7901 Long term (current) use of anticoagulants: Secondary | ICD-10-CM

## 2012-03-20 ENCOUNTER — Ambulatory Visit (INDEPENDENT_AMBULATORY_CARE_PROVIDER_SITE_OTHER): Payer: Medicare Other | Admitting: Endocrinology

## 2012-03-20 ENCOUNTER — Encounter: Payer: Self-pay | Admitting: Endocrinology

## 2012-03-20 VITALS — BP 134/80 | HR 78 | Wt 296.0 lb

## 2012-03-20 DIAGNOSIS — E890 Postprocedural hypothyroidism: Secondary | ICD-10-CM

## 2012-03-20 MED ORDER — LEVOTHYROXINE SODIUM 200 MCG PO TABS: ORAL_TABLET | ORAL | Status: DC

## 2012-03-20 NOTE — Patient Instructions (Addendum)
blood tests are being requested for you today.  We'll contact you with results.   Please come back for a follow-up appointment in 3 months.    

## 2012-03-20 NOTE — Progress Notes (Signed)
Subjective:    Patient ID: Raymond Cullens., male    DOB: [birth year], 52 y.o.   MRN: [id number]  HPI Pt states he was dx'ed with hyperthyroidism 15 years ago. He describes what sounds like i-131 rx, approx 10 years ago. He has been on amiodarone x approx 10 years.  He has required serial adjustments in his synthroid over the past few years.  He has lost weight since last ov here.   Past Medical History  Diagnosis Date  . Systolic CHF, chronic   . Non-ischemic cardiomyopathy     echo 11/10: EF 15%, mild MR, mod LAE, mild dec. RVSF, PASP;   cath 10/08: normal cors; Echo 8/12: EF 20-25%, diffuse HK, grade 1 diastolic dysfunction, mild LAE; Myoview 5/12: Very mild distal anterior and apical ischemia, EF 17% (low risk-medical therapy continued)    . Atrial fibrillation     ON COUMADIN  . Hypertension   . Hypothyroidism     s/p RAI therapy  . Obesity   . Non-compliance   . Dyslexia   . 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 RBBB (right bundle branch block)   . Palpitations     W/WIDE COMPLEX TACHYCARDIA IN THE PAST, REFUSED REPEATED OFFERS OF AN ICD  . Biventricular ICD  st Judes]   . Obesity     Past Surgical History  Procedure Laterality Date  . Insert / replace / remove pacemaker  06/11/09    [employer] [id number] BIVENTRICULAR AICD SERIAL [phone]  . Knee surgery      LEFT X2  . Cardiac catheterization      History   Social History  . Marital Status: Legally Separated    Spouse Name: N/A    Number of Children: N/A  . Years of Education: N/A   Occupational History  . DISABLED     HOWEVER, HE DOES WORK A LITTLE ON CARS   Social History Main Topics  . Smoking status: Never Smoker   . Smokeless tobacco: Not on file  . Alcohol Use: No  . Drug Use: No  . Sexually Active: Not on file   Other Topics Concern  . Not on file   Social History Narrative   LIVES ALONE IN [place], [place]   DISABLED  HOWEVER, DOES WORK A LITTLE ON CARS   NO ETOH   NO TOBACCO   NO DRUG ABUSE    Current Outpatient Prescriptions on File Prior to Visit  Medication Sig Dispense Refill  . acetaminophen (TYLENOL) 325 MG tablet Take 650 mg by mouth every 4 (four) hours as needed.        Marland Kitchen acyclovir (ZOVIRAX) 800 MG tablet Take 1 tablet (800 mg total) by mouth daily. Take 1/2 tablet by mouth twice daily  30 tablet  2  . ALPRAZolam (XANAX) 0.25 MG tablet Take 1 tablet (0.25 mg total) by mouth at bedtime as needed. Need office visit for additional refills.  30 tablet  0  . amiodarone (PACERONE) 200 MG tablet       . DIGOX 0.125 MG tablet TAKE 1 TABLET BY MOUTH ONCE DAILY  30 tablet  5  . furosemide (LASIX) 80 MG tablet Take 80 mg by mouth as needed.       Marland Kitchen levothyroxine (SYNTHROID, LEVOTHROID) 300 MCG tablet Take 1 tablet (300 mcg total) by mouth daily.  30 tablet  2  . losartan (COZAAR) 50  MG tablet Take 1 tablet (50 mg total) by mouth daily.  1 tablet  0  . metoprolol (LOPRESSOR) 50 MG tablet Take 1 tablet (50 mg total) by mouth 2 (two) times daily.  60 tablet  6  . spironolactone (ALDACTONE) 50 MG tablet TAKE 1/2 TABLET BY MOUTH ONCE DAILY  15 tablet  6  . warfarin (COUMADIN) 7.5 MG tablet TAKE AS DIRECTED BY ANTICOAGULATION     CLINIC  50 tablet  3   No current facility-administered medications on file prior to visit.    Allergies  Allergen Reactions  . Losartan Cough  . Valsartan Cough    Family History  Problem Relation Age of Onset  . Heart disease Father     BP 134/80  Pulse 78  Wt 296 lb (134.265 kg)  BMI 43.69 kg/m2  SpO2 98%    Review of Systems Denies tremor.    Objective:   Physical Exam VITAL SIGNS:  See vs page GENERAL: no distress Neuro: no tremor.       Assessment & Plan:  Hypothyroidism, which may be multifactorial

## 2012-03-21 ENCOUNTER — Telehealth: Payer: Self-pay | Admitting: Cardiology

## 2012-03-21 ENCOUNTER — Telehealth: Payer: Self-pay | Admitting: Endocrinology

## 2012-03-21 NOTE — Telephone Encounter (Signed)
New problem   Pt was wondering if there was some type of program to help him pay for his medication. Pt stated his medications doubled in price. Please call pt to discuss this matter.

## 2012-03-21 NOTE — Telephone Encounter (Signed)
Per pt several of his medications have atleast doubled in price and he can not afford them.  Aware I will call to discuss with his pharmacy and call him back once a decision has been made with any changes.  He is agreeable.

## 2012-03-21 NOTE — Telephone Encounter (Signed)
The patient called to request return call regarding medication questions.  Please call the patient at [phone].

## 2012-03-21 NOTE — Telephone Encounter (Signed)
Spoke with pharmacy - pt has a $300 deductible for medications that he has to meet before his insurance will cover and he will have normal co-pays.  At this point he has paid $60.29 for the year.  Pt is aware of this and knows he must meet his deductible.  He is aware all his medications are at generic pricing and we have no control of the cost.  He also know generic medications are not sampled.

## 2012-03-21 NOTE — Progress Notes (Signed)
  Subjective:    Patient ID: Raymond Cullens., male    DOB: 17-Dec-1960, 52 y.o.   MRN: [id number]  HPI -   Review of Systems     Objective:   Physical Exam        Assessment & Plan:

## 2012-03-21 NOTE — Telephone Encounter (Signed)
Pt advised and states an understanding 

## 2012-03-27 ENCOUNTER — Encounter: Payer: Self-pay | Admitting: Endocrinology

## 2012-03-29 ENCOUNTER — Ambulatory Visit (INDEPENDENT_AMBULATORY_CARE_PROVIDER_SITE_OTHER): Payer: Medicare Other | Admitting: *Deleted

## 2012-03-29 DIAGNOSIS — I4891 Unspecified atrial fibrillation: Secondary | ICD-10-CM

## 2012-03-29 DIAGNOSIS — Z7901 Long term (current) use of anticoagulants: Secondary | ICD-10-CM

## 2012-04-14 ENCOUNTER — Ambulatory Visit: Payer: Medicare Other | Admitting: Physician Assistant

## 2012-04-19 ENCOUNTER — Encounter: Payer: Self-pay | Admitting: Gastroenterology

## 2012-04-19 ENCOUNTER — Telehealth: Payer: Self-pay | Admitting: *Deleted

## 2012-04-19 ENCOUNTER — Ambulatory Visit (INDEPENDENT_AMBULATORY_CARE_PROVIDER_SITE_OTHER): Payer: Medicare Other | Admitting: Physician Assistant

## 2012-04-19 ENCOUNTER — Encounter: Payer: Self-pay | Admitting: Internal Medicine

## 2012-04-19 ENCOUNTER — Ambulatory Visit (INDEPENDENT_AMBULATORY_CARE_PROVIDER_SITE_OTHER): Payer: Medicare Other | Admitting: *Deleted

## 2012-04-19 ENCOUNTER — Other Ambulatory Visit: Payer: Self-pay

## 2012-04-19 ENCOUNTER — Encounter: Payer: Self-pay | Admitting: Physician Assistant

## 2012-04-19 VITALS — BP 118/72 | HR 81 | Ht 69.0 in | Wt 284.0 lb

## 2012-04-19 DIAGNOSIS — I428 Other cardiomyopathies: Secondary | ICD-10-CM

## 2012-04-19 DIAGNOSIS — I4891 Unspecified atrial fibrillation: Secondary | ICD-10-CM

## 2012-04-19 DIAGNOSIS — I429 Cardiomyopathy, unspecified: Secondary | ICD-10-CM

## 2012-04-19 DIAGNOSIS — I5022 Chronic systolic (congestive) heart failure: Secondary | ICD-10-CM

## 2012-04-19 DIAGNOSIS — R05 Cough: Secondary | ICD-10-CM

## 2012-04-19 DIAGNOSIS — K625 Hemorrhage of anus and rectum: Secondary | ICD-10-CM

## 2012-04-19 DIAGNOSIS — Z95810 Presence of automatic (implantable) cardiac defibrillator: Secondary | ICD-10-CM

## 2012-04-19 DIAGNOSIS — R0602 Shortness of breath: Secondary | ICD-10-CM

## 2012-04-19 DIAGNOSIS — E039 Hypothyroidism, unspecified: Secondary | ICD-10-CM

## 2012-04-19 DIAGNOSIS — I10 Essential (primary) hypertension: Secondary | ICD-10-CM

## 2012-04-19 DIAGNOSIS — Z7901 Long term (current) use of anticoagulants: Secondary | ICD-10-CM

## 2012-04-19 LAB — ICD DEVICE OBSERVATION
AL AMPLITUDE: 5 mv
AL IMPEDENCE ICD: 400 Ohm
BAMS-0001: 170 {beats}/min
LV LEAD THRESHOLD: 0.5 V
RV LEAD AMPLITUDE: 9.5 mv
RV LEAD THRESHOLD: 0.5 V
TZAT-0001FASTVT: 1
TZON-0003SLOWVT: 350 ms
TZON-0010FASTVT: 80 ms
TZON-0010SLOWVT: 80 ms
TZST-0001FASTVT: 2
TZST-0003FASTVT: 20 J
TZST-0003FASTVT: 40 J

## 2012-04-19 LAB — CBC WITH DIFFERENTIAL/PLATELET
Basophils Absolute: 0.1 10*3/uL (ref 0.0–0.1)
Eosinophils Relative: 1.1 % (ref 0.0–5.0)
HCT: 38.2 % — ABNORMAL LOW (ref 39.0–52.0)
Hemoglobin: 12.8 g/dL — ABNORMAL LOW (ref 13.0–17.0)
Lymphs Abs: 1.1 10*3/uL (ref 0.7–4.0)
MCV: 93.6 fl (ref 78.0–100.0)
Monocytes Absolute: 0.8 10*3/uL (ref 0.1–1.0)
Monocytes Relative: 11.3 % (ref 3.0–12.0)
Neutro Abs: 4.9 10*3/uL (ref 1.4–7.7)
Platelets: 290 10*3/uL (ref 150.0–400.0)
RDW: 15 % — ABNORMAL HIGH (ref 11.5–14.6)

## 2012-04-19 LAB — BASIC METABOLIC PANEL
BUN: 15 mg/dL (ref 6–23)
Chloride: 103 mEq/L (ref 96–112)
GFR: 52.75 mL/min — ABNORMAL LOW (ref >60.00)
Glucose, Bld: 101 mg/dL — ABNORMAL HIGH (ref 70–99)
Potassium: 4.1 mEq/L (ref 3.5–5.1)
Sodium: 139 mEq/L (ref 135–145)

## 2012-04-19 MED ORDER — OMEPRAZOLE 20 MG PO CPDR: DELAYED_RELEASE_CAPSULE | ORAL | Status: DC

## 2012-04-19 NOTE — Telephone Encounter (Signed)
Message copied by Tarri Fuller on Wed Apr 19, 2012  5:37 PM ------      Message from: [place], [place] T      Created: Wed Apr 19, 2012  5:26 PM       BNP slightly elevated.       Chart states he takes Lasix prn.  Please find out how he takes it so we can adjust the dose.            Renal fxn and K+ ok.      Hgb slightly lower.  Referral to GI made today due to recent hx of rectal bleeding.  Please make sure he is getting an appt.      Tereso Newcomer, PA-C  5:26 PM 04/19/2012 ------

## 2012-04-19 NOTE — Telephone Encounter (Signed)
pt notified about lab results. states he takes 80 mg lasix 1-2 x wkly, per Bing Neighbors. PA to take 2 extra doses of 80 mg lasix this week, pt will get bmet 4/17 when he sees Dr. Jarold Motto for rectal bleeding

## 2012-04-19 NOTE — Patient Instructions (Addendum)
Your physician recommends that you return for lab work in: BMET, CBC W/DIFF, BNP   Your physician has requested that you have an echocardiogram. Echocardiography is a painless test that uses sound waves to create images of your heart. It provides your doctor with information about the size and shape of your heart and how well your heart's chambers and valves are working. This procedure takes approximately one hour. There are no restrictions for this procedure.   START PRILOSEC 20 MG DAILY FOR 2 WEEKS THEN TAKE ONLY AS NEEDED  You have been referred to [facility] RECTAL BLEEDING  PLEASE FOLLOW UP WITH DR. HOCHREIN IN 3 MONTHS

## 2012-04-19 NOTE — Progress Notes (Signed)
[address] [place], [place]  [address] Phone: [phone], Fax:  [phone]  Date:  04/19/2012   ID:  Raymond Levels., DOB 23-Mar-1960, MRN [id number]  PCP:  Dow Adolph, MD  Primary Cardiologist:  Dr. [name]     History of Present Illness: Raymond [name]. is a 52 y.o. male who returns for followup. 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 Myoview 5/12: Very mild distal anterior and apical ischemia, EF 17% (low risk-medical therapy continued). Echo 8/12: EF 20-25%, diffuse HK, grade 1 diastolic dysfunction, mild LAE. He was last seen by Dr. Antoine Poche 12/2011. He noted weight gain and shortness of breath.  He is on amiodarone.  TSH was very high and was referred back to endocrinology.  Synthroid was increased and he was kept on amiodarone.  Since last seen, he notes a non-productive cough.  No fever.  Worse with lying down.  No PND.  He has chronic chest pain.  No changes. Notes NYHA Class IIb-III dyspnea.  No change.  No syncope.    Labs (1/14):   K 4.3, creatinine 1.5, TSH 38.76, T4 0.42, T3 1.7 Labs (3/14):   TSH 23.66  Wt Readings from Last 3 Encounters:  04/19/12 284 lb (128.822 kg)  03/20/12 296 lb (134.265 kg)  02/15/12 297 lb 9.6 oz (134.99 kg)     Past Medical History  Diagnosis Date  . Systolic CHF, chronic   . Non-ischemic cardiomyopathy     echo 11/10: EF 15%, mild MR, mod LAE, mild dec. RVSF, PASP;   cath 10/08: normal cors; Echo 8/12: EF 20-25%, diffuse HK, grade 1 diastolic dysfunction, mild LAE; Myoview 5/12: Very mild distal anterior and apical ischemia, EF 17% (low risk-medical therapy continued)    . Atrial fibrillation     ON COUMADIN  . Hypertension   . Hypothyroidism     s/p RAI therapy  . Obesity   . Non-compliance   . Dyslexia   . History of CVA (cerebrovascular accident) 11/2008      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 tx with tPA and PTA and stent  . RBBB (right bundle branch block)   . Palpitations     W/WIDE COMPLEX TACHYCARDIA IN THE PAST, REFUSED REPEATED OFFERS OF AN ICD  . Biventricular ICD  st Judes]   . Obesity     Current Outpatient Prescriptions  Medication Sig Dispense Refill  . acetaminophen (TYLENOL) 325 MG tablet Take 650 mg by mouth every 4 (four) hours as needed.        Marland Kitchen acyclovir (ZOVIRAX) 800 MG tablet Take 1 tablet (800 mg total) by mouth daily. Take 1/2 tablet by mouth twice daily  30 tablet  2  . ALPRAZolam (XANAX) 0.25 MG tablet Take 1 tablet (0.25 mg total) by mouth at bedtime as needed. Need office visit for additional refills.  30 tablet  0  . amiodarone (PACERONE) 200 MG tablet Take 200 mg by mouth daily.       Marland Kitchen DIGOX 0.125 MG tablet TAKE 1 TABLET BY MOUTH ONCE DAILY  30 tablet  5  . furosemide (LASIX) 80 MG tablet Take 80 mg by mouth as needed.       Marland Kitchen levothyroxine (SYNTHROID, LEVOTHROID) 200 MCG tablet 2 tabs daily  60 tablet  2  . losartan (COZAAR) 50 MG tablet Take 1 tablet (50 mg total) by mouth daily.  1 tablet  0  . metoprolol (LOPRESSOR) 50 MG tablet Take 1 tablet (50 mg total) by mouth 2 (two) times daily.  60 tablet  6  . spironolactone (ALDACTONE) 50 MG tablet TAKE 1/2 TABLET BY MOUTH ONCE DAILY  15 tablet  6  . warfarin (COUMADIN) 7.5 MG tablet TAKE AS DIRECTED BY ANTICOAGULATION     CLINIC  50 tablet  3   No current facility-administered medications for this visit.    Allergies:    Allergies  Allergen Reactions  . Losartan Cough  . Valsartan Cough    Social History:  The patient  reports that he has never smoked. He does not have any smokeless tobacco history on file. He reports that he does not drink alcohol or use illicit drugs.   ROS:  See the HPI.  He notes an episode of rectal bleeding about a month ago when his INR was 5.  None since.  No melena.  He does note water brash symptoms and belching.  All other systems reviewed  and negative.   PHYSICAL EXAM: VS:  BP 118/72  Pulse 81  Ht 5\' 9"  (1.753 m)  Wt 284 lb (128.822 kg)  BMI 41.92 kg/m2 Well nourished, well developed, in no acute distress HEENT: normal; OP pink without exudate. Neck: no JVD Lymph:  No cervical adenopathy Cardiac:  normal S1, S2; RRR; no murmur Lungs:  clear to auscultation bilaterally, no wheezing, rhonchi or rales Abd: soft, nontender, no hepatomegaly Ext: trace bilateral LE edema Skin: warm and dry Neuro:  CNs 2-12 intact, no focal abnormalities noted  EKG:  V paced, HR 81  ASSESSMENT AND PLAN:  1. Chronic Systolic CHF:  Volume appears stable.  Weights are down.  Continue current regimen with beta blocker, ARB, digoxin, spironolactone.  I will check BMET and BNP since he has had increased cough.  I do not think his cough represents volume overload.  But, if his BNP is higher, I will adjust his Lasix.  Arrange f/u Echo.   2. Atrial Fibrillation:  Maintaining NSR.  Dr. Everardo All has felt he can remain on amiodarone.  Continue coumadin.  Recent LFTs and CXR ok.  3. Rectal Bleeding:  Check CBC.  Will refer to GI. 4. Cough:  He does have some symptoms of GERD.  I will start Prilosec 20 mg QD x 2 weeks.  Check BNP today.  Question if post nasal drip (allergic rhinitis) explains symptoms as well.  Symptoms sound up airway.  He can also try Claritin to see if this will help.  5. S/p CRT-D:  F/u with device clinic as planned.  6. Hypothyroidism:  Per endocrinology.  Synthroid recently increased again.   7. Hypertension:  Controlled.  Continue current therapy.  8. Disposition:  Follow up with Dr. [name] in 3 mos.    Signed, Tereso Newcomer, PA-C  2:16 PM 04/19/2012

## 2012-04-19 NOTE — Progress Notes (Signed)
ICD check by industry for research 

## 2012-04-27 ENCOUNTER — Telehealth: Payer: Self-pay | Admitting: Cardiology

## 2012-04-27 NOTE — Telephone Encounter (Signed)
New problem    Discuss medication   C/O cough up blood .

## 2012-04-27 NOTE — Telephone Encounter (Signed)
Returned call to patient's mother who states that patient is coughing so hard he is coughing up blood.  Mother states she does not know exactly how much blood because the patient does not live with her.  Patient asked her to call us b/c he cannot talk without coughing.  Mother also uncertain as to whether or not patient has a fever.  States she is concerned since patient is on coumadin.  Patient was seen here 4/2 by Tereso Newcomer, PA-C and was referred to GI for rectal bleeding and was scheduled for ECHO 4/17.  Tereso Newcomer, PA-C was consulted verbally today since he was last provider to see patient and he advises that patient see PCP for possible respiratory infection.  Mother states patient no longer has PCP and was advised to go to Urgent Care for examination and possible chest xray.

## 2012-04-28 ENCOUNTER — Ambulatory Visit (INDEPENDENT_AMBULATORY_CARE_PROVIDER_SITE_OTHER): Payer: Medicare Other | Admitting: Family Medicine

## 2012-04-28 ENCOUNTER — Ambulatory Visit: Payer: Medicare Other

## 2012-04-28 VITALS — BP 112/85 | HR 71 | Temp 98.4°F | Resp 18 | Wt 284.0 lb

## 2012-04-28 DIAGNOSIS — R059 Cough, unspecified: Secondary | ICD-10-CM

## 2012-04-28 DIAGNOSIS — R05 Cough: Secondary | ICD-10-CM

## 2012-04-28 DIAGNOSIS — G4700 Insomnia, unspecified: Secondary | ICD-10-CM

## 2012-04-28 DIAGNOSIS — I509 Heart failure, unspecified: Secondary | ICD-10-CM

## 2012-04-28 LAB — POCT CBC
Granulocyte percent: 78.9 %G (ref 37–80)
HCT, POC: 43.6 % (ref 43.5–53.7)
Hemoglobin: 13.5 g/dL — AB (ref 14.1–18.1)
Lymph, poc: 1.1 (ref 0.6–3.4)
MCH, POC: 30.1 pg (ref 27–31.2)
MCHC: 31 g/dL — AB (ref 31.8–35.4)
MCV: 97 fL (ref 80–97)
MID (cbc): 0.5 (ref 0–0.9)
MPV: 8.5 fL (ref 0–99.8)
POC Granulocyte: 5.9 (ref 2–6.9)
POC LYMPH PERCENT: 14.8 %L (ref 10–50)
POC MID %: 6.3 %M (ref 0–12)
Platelet Count, POC: 335 10*3/uL (ref 142–424)
RBC: 4.49 M/uL — AB (ref 4.69–6.13)
RDW, POC: 15.8 %
WBC: 7.5 10*3/uL (ref 4.6–10.2)

## 2012-04-28 LAB — COMPREHENSIVE METABOLIC PANEL
ALT: 12 U/L (ref 0–53)
AST: 16 U/L (ref 0–37)
Albumin: 4.5 g/dL (ref 3.5–5.2)
Alkaline Phosphatase: 79 U/L (ref 39–117)
BUN: 15 mg/dL (ref 6–23)
CO2: 24 mEq/L (ref 19–32)
Calcium: 9.5 mg/dL (ref 8.4–10.5)
Chloride: 103 mEq/L (ref 96–112)
Creat: 1.22 mg/dL (ref 0.50–1.35)
Glucose, Bld: 95 mg/dL (ref 70–99)
Potassium: 4.4 mEq/L (ref 3.5–5.3)
Sodium: 137 mEq/L (ref 135–145)
Total Bilirubin: 0.5 mg/dL (ref 0.3–1.2)
Total Protein: 7 g/dL (ref 6.0–8.3)

## 2012-04-28 LAB — TSH: TSH: 1.862 u[IU]/mL (ref 0.350–4.500)

## 2012-04-28 MED ORDER — HYDROCODONE-HOMATROPINE 5-1.5 MG/5ML PO SYRP: 5.0000 mL | ORAL_SOLUTION | Freq: Three times a day (TID) | ORAL | Status: DC | PRN

## 2012-04-28 MED ORDER — PREDNISONE 20 MG PO TABS: 40.0000 mg | ORAL_TABLET | Freq: Every day | ORAL | Status: DC

## 2012-04-28 MED ORDER — ALPRAZOLAM 0.25 MG PO TABS: 0.2500 mg | ORAL_TABLET | Freq: Every evening | ORAL | Status: DC | PRN

## 2012-04-28 NOTE — Telephone Encounter (Signed)
Pt was seen by Urgent Care.  Please see that office visit note.

## 2012-04-28 NOTE — Progress Notes (Signed)
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  No h/o asthma, fever, cigarette smoking.  He has had a cough like this once when he was in heart failure.    Awakens at night with awful taste in mouth.  PMHx:  CVA(2010), hypothyroidism, anxiety, atrial fibrillation, obesity  Objective:  Obese, alert, cooperative.  Seen with mother  HEENT:  Unremarkable except for recent tooth #19 extraction Chest: few dry rales Heart:  Faint sounds, irreg/irreg Ext:  Marked left hemiparesis with main en griffe left side (arm is worst area of weakness.  Able to do SLR.  Slight left facial weakness)  No edema [facility] reading (PRIMARY) by  Dr. Milus Glazier CXR.  Marked pulmonary congestion suggestive of CHF with cardiomegaly  Assessment: CHF with chronic cough  Plan:  Restart the Lasix 80 mg daily Prednisone 40 daily x 3 hydromet  Recheck 3 days.       Also, patient has had problems with sleep.  He would like a refill of his xanax.

## 2012-04-28 NOTE — Patient Instructions (Addendum)
Recheck in 3 days.  Zantac at night.  Restart the lasix daily.

## 2012-04-29 LAB — BRAIN NATRIURETIC PEPTIDE: Brain Natriuretic Peptide: 296.3 pg/mL — ABNORMAL HIGH (ref 0.0–100.0)

## 2012-05-04 ENCOUNTER — Other Ambulatory Visit: Payer: Medicare Other

## 2012-05-04 ENCOUNTER — Ambulatory Visit (INDEPENDENT_AMBULATORY_CARE_PROVIDER_SITE_OTHER): Payer: Medicare Other | Admitting: Gastroenterology

## 2012-05-04 ENCOUNTER — Ambulatory Visit (HOSPITAL_COMMUNITY): Payer: Medicare Other | Attending: Physician Assistant | Admitting: Radiology

## 2012-05-04 ENCOUNTER — Encounter: Payer: Self-pay | Admitting: Gastroenterology

## 2012-05-04 VITALS — BP 110/70 | HR 80 | Ht 68.5 in | Wt 288.2 lb

## 2012-05-04 DIAGNOSIS — K625 Hemorrhage of anus and rectum: Secondary | ICD-10-CM

## 2012-05-04 DIAGNOSIS — I428 Other cardiomyopathies: Secondary | ICD-10-CM | POA: Insufficient documentation

## 2012-05-04 DIAGNOSIS — K649 Unspecified hemorrhoids: Secondary | ICD-10-CM

## 2012-05-04 DIAGNOSIS — I429 Cardiomyopathy, unspecified: Secondary | ICD-10-CM

## 2012-05-04 DIAGNOSIS — Z7901 Long term (current) use of anticoagulants: Secondary | ICD-10-CM

## 2012-05-04 DIAGNOSIS — Z95810 Presence of automatic (implantable) cardiac defibrillator: Secondary | ICD-10-CM

## 2012-05-04 DIAGNOSIS — I509 Heart failure, unspecified: Secondary | ICD-10-CM

## 2012-05-04 MED ORDER — HYDROCORTISONE ACETATE 25 MG RE SUPP: RECTAL | Status: DC

## 2012-05-04 NOTE — Patient Instructions (Signed)
Start Colace over the counter 100 mg one tablet by mouth twice daily.   We have sent the following medications to your pharmacy for you to pick up at your convenience: Anusol suppositories.   cc: Dow Adolph, MD

## 2012-05-04 NOTE — Progress Notes (Signed)
Echocardiogram performed.  

## 2012-05-04 NOTE — Progress Notes (Signed)
History of Present Illness:  This is a severely ill 52 year old male with ischemic cardiomyopathy and apparently end-stage congestive heart failure with an ejection fraction 15%.  He is followed by cardiology and primary care, also has a defibrillator and has chronic atrial fibrillation is chronically anticoagulated.  He recently had one episode of fresh blood when he wiped 6 weeks ago when his INR was markedly elevated.  He had no abdominal rectal pain his had no recurrent episodes.  Other problems include obesity, heart block, hypertension, and hypothyroidism with previous thyroid radiation.  He is on multiple medications listed and reviewed in his chart including Coumadin.  He complains of shortness of breath exertion and has no exercise capability.  Recently placed on prednisone and Lasix by Dr. Milus Glazier.  Other problems have included chronic peripheral edema, anxiety disorder, chronic pain syndrome and chronic coughing, I cannot see past history of GI problems, previous colonoscopy, endoscopy, barium studies.  His appetite is good and his weight is stable.  Family history is noncontributory.  I have reviewed this patient's present history, medical and surgical past history, allergies and medications.     ROS:   All systems were reviewed and are negative unless otherwise stated in the HPI.    Physical Exam: Morbidly obese patient who is short of breath at rest.  Pressure 110/70, pulse 80 and irregular, and weight 288 with a BMI of 43.19.  98% oxygen saturation. General well developed well nourished patient in no acute distress, appearing their stated age Eyes PERRLA, no icterus, fundoscopic exam per opthamologist Skin no lesions noted Neck supple, no adenopathy, no thyroid enlargement, no tenderness Chest clear to percussion and auscultation... large airway rhonchi. Heart no significant murmurs, gallops or rubs noted.. irregular but controlled rhythm noted. Abdomen no hepatosplenomegaly masses  or tenderness, BS normal.  Rectal inspection normal no fissures, or fistulae noted.  No masses or tenderness on digital exam. Stool guaiac negative. Extremities  edema noted without evidence of phlebitis Neurologic patient oriented x 3, cranial nerves intact, no focal neurologic deficits noted. Psychological mental status normal and normal affect.  ANOSCOPY: I cannot appreciate perianal fissures or fistulae.  Exam is difficult because of his large size.  There appears to be a posterior lateral nonbleeding internal hemorrhoid.  Anoscopic exam otherwise negative.  Stool is guaiac-negative.  Assessment and plan: Bleeding associated with elevated INR and a morbidly obese patient who has critical, probable worsening CHF from a nonischemic cardiomyopathy.  He is not a candidate for conscious sedation and colonoscopy for multiple reasons including his very low ejection fraction, obesity, Coumadin use, and cardiac arrhythmias with ICD.  Review of his labs shows no evidence of anemia, and I placed him on Anusol-HC suppositories at bedtime for a week to then be used as needed.  He has mild constipation I prescribed Colace under milligrams twice a day.  He has continued problems I would recommend CT colonoscopy exam.  I have urged him to keep close followup with the Coumadin clinic for his elevated INR with previous rectal bleeding, also to followup with cardiology.  No diagnosis found.

## 2012-05-05 ENCOUNTER — Encounter: Payer: Self-pay | Admitting: Physician Assistant

## 2012-05-05 ENCOUNTER — Telehealth: Payer: Self-pay | Admitting: Cardiology

## 2012-05-05 NOTE — Telephone Encounter (Signed)
New Problem: ° ° ° °Called in returning your call.   Please call back. °

## 2012-05-05 NOTE — Telephone Encounter (Signed)
Message copied by Tarri Fuller on Fri May 05, 2012  2:02 PM ------      Message from: [place], [place] T      Created: Fri May 05, 2012  1:28 PM       EF 15%.  Fairly c/w prior assessments.      Mod to severe diastolic dysfunction.      Mod Pulmo HTN.      No major changes since last echo.        Patient had Lasix adjusted by urgent care due to increased volume recently.      Schedule him for f/u on CHF in the next 1-2 weeks with Dr. [name] or me on a day Dr. Antoine Poche is here.      Tereso Newcomer, PA-C  1:26 PM 05/05/2012 ------

## 2012-05-05 NOTE — Telephone Encounter (Signed)
pt notified about echo results and verbalized understanding to me today. per Bing Neighbors. PA f/u same day [facility] in the office, pt agreed to come in 05/23/12 @ 10:10, will discuss further echo results then w/PA.

## 2012-05-08 ENCOUNTER — Ambulatory Visit (INDEPENDENT_AMBULATORY_CARE_PROVIDER_SITE_OTHER): Payer: Medicare Other | Admitting: Endocrinology

## 2012-05-08 ENCOUNTER — Encounter: Payer: Self-pay | Admitting: Endocrinology

## 2012-05-08 VITALS — BP 128/74 | HR 72 | Wt 278.0 lb

## 2012-05-08 DIAGNOSIS — E890 Postprocedural hypothyroidism: Secondary | ICD-10-CM

## 2012-05-08 NOTE — Patient Instructions (Addendum)
Please continue the same thyroid medication.   Please come back for a follow-up appointment in 4 months.

## 2012-05-08 NOTE — Progress Notes (Signed)
Subjective:    Patient ID: Raymond Cullens., male    DOB: [birth year], 52 y.o.   MRN: [id number]  HPI Pt states he was dx'ed with hyperthyroidism 15 years ago. He describes what sounds like i-131 rx, approx 10 years ago. He has been on amiodarone x approx 10 years.  He has required serial adjustments in his synthroid over the past few years.  He requires a very high dosage of synthroid, probably due to amiodarone.  pt states he feels well in general.   Past Medical History  Diagnosis Date  . Systolic CHF, chronic   . Non-ischemic cardiomyopathy     a. echo 11/10: EF 15%;   b. cath 10/08: normal cors;  c. Echo 8/12: EF 20-25%;  d. Myoview 5/12: Very mild distal ant and apical isch, EF 17% (low risk-medical Rx cont'd);  e.  Echo 4/14:  EF 15%, diff HK, severe diast dysfn, restrictive physiology, E/medial e' > 15 suggests LVEDP at least 20 mmHg, borderline dilated Ao root, mild MR, mod LAE, mod reduced RVSF, mod TR, PASP 64  . Atrial fibrillation     ON COUMADIN  . Hypertension   . Hypothyroidism     s/p RAI therapy  . Obesity   . Non-compliance   . Dyslexia   . 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 RBBB (right bundle branch block)   . Palpitations     W/WIDE COMPLEX TACHYCARDIA IN THE PAST, REFUSED REPEATED OFFERS OF AN ICD  . Biventricular ICD  st Judes]     Past Surgical History  Procedure Laterality Date  . Insert / replace / remove pacemaker  06/11/09    [employer] [id number] BIVENTRICULAR AICD SERIAL [id number]  . Knee surgery Left   . Cardiac catheterization      History   Social History  . Marital Status: Legally Separated    Spouse Name: N/A    Number of Children: 0  . Years of Education: N/A   Occupational History  . DISABLED     HOWEVER, HE DOES WORK A LITTLE ON CARS   Social History Main Topics  . Smoking status: Never Smoker   . Smokeless tobacco: Never Used  . Alcohol Use: No  .  Drug Use: No  . Sexually Active: Not on file   Other Topics Concern  . Not on file   Social History Narrative   LIVES ALONE IN [place], [place]   DISABLED HOWEVER, DOES WORK A LITTLE ON CARS   NO ETOH   NO TOBACCO   NO DRUG ABUSE    Current Outpatient Prescriptions on File Prior to Visit  Medication Sig Dispense Refill  . acyclovir (ZOVIRAX) 800 MG tablet Take 1 tablet (800 mg total) by mouth daily. Take 1/2 tablet by mouth twice daily  30 tablet  2  . ALPRAZolam (XANAX) 0.25 MG tablet Take 1 tablet (0.25 mg total) by mouth at bedtime as needed. Need office visit for additional refills.  30 tablet  5  . amiodarone (PACERONE) 200 MG tablet Take 200 mg by mouth daily.       Marland Kitchen DIGOX 0.125 MG tablet TAKE 1 TABLET BY MOUTH ONCE DAILY  30 tablet  5  . furosemide (LASIX) 80 MG tablet Take 80 mg by mouth as needed.       Marland Kitchen HYDROcodone-homatropine (HYCODAN) 5-1.5 MG/5ML syrup Take 5 mLs by mouth every 8 (eight)  hours as needed for cough.  120 mL  0  . hydrocortisone (ANUSOL-HC) 25 MG suppository Use one suppository rectally every night x 1 week then as needed  30 suppository  0  . levothyroxine (SYNTHROID, LEVOTHROID) 200 MCG tablet 2 tabs daily  60 tablet  2  . losartan (COZAAR) 50 MG tablet Take 1 tablet (50 mg total) by mouth daily.  1 tablet  0  . metoprolol (LOPRESSOR) 50 MG tablet Take 1 tablet (50 mg total) by mouth 2 (two) times daily.  60 tablet  6  . predniSONE (DELTASONE) 20 MG tablet Take 2 tablets (40 mg total) by mouth daily.  10 tablet  0  . spironolactone (ALDACTONE) 50 MG tablet TAKE 1/2 TABLET BY MOUTH ONCE DAILY  15 tablet  6  . warfarin (COUMADIN) 7.5 MG tablet TAKE AS DIRECTED BY ANTICOAGULATION     CLINIC  50 tablet  3   No current facility-administered medications on file prior to visit.    Allergies  Allergen Reactions  . Losartan Cough  . Valsartan Cough    Family History  Problem Relation Age of Onset  . Heart disease Father    BP 128/74  Pulse 72  Wt 278 lb  (126.1 kg)  BMI 41.65 kg/m2  SpO2 98%  Review of Systems He has lost more weight, due to his efforts    Objective:   Physical Exam VITAL SIGNS:  See vs page GENERAL: no distress NECK: There is no palpable thyroid enlargement.  No thyroid nodule is palpable.  No palpable lymphadenopathy at the anterior neck.   Lab Results  Component Value Date   TSH 1.862 04/28/2012   T3TOTAL 64.1* 07/30/2009   T4TOTAL 5.7 07/23/2009      Assessment & Plan:  Post-i-131 hypothyroidism, well-replaced.  His need for a high dosage of synthroid is probably due to amiodarone.

## 2012-05-10 ENCOUNTER — Telehealth: Payer: Self-pay | Admitting: Cardiology

## 2012-05-10 NOTE — Telephone Encounter (Signed)
Mother aware unsure of any diagnosis that would cover for [facility] at this time.  She is concerned that pt reports he "can't breathe at night" she reports he says he stops breathing and wakes up.  Requested pt speak to MD at next appt for evaluation.

## 2012-05-10 NOTE — Telephone Encounter (Signed)
New problem    Mom wants to discuss getting a home health nurse to come out to the house

## 2012-05-17 ENCOUNTER — Ambulatory Visit (INDEPENDENT_AMBULATORY_CARE_PROVIDER_SITE_OTHER): Payer: Medicare Other | Admitting: Pharmacist

## 2012-05-17 DIAGNOSIS — Z7901 Long term (current) use of anticoagulants: Secondary | ICD-10-CM

## 2012-05-17 DIAGNOSIS — I4891 Unspecified atrial fibrillation: Secondary | ICD-10-CM

## 2012-05-17 LAB — POCT INR: INR: 2.4

## 2012-05-23 ENCOUNTER — Ambulatory Visit: Payer: Medicare Other | Admitting: Physician Assistant

## 2012-05-30 ENCOUNTER — Other Ambulatory Visit: Payer: Self-pay

## 2012-05-30 MED ORDER — FUROSEMIDE 80 MG PO TABS: 80.0000 mg | ORAL_TABLET | ORAL | Status: DC | PRN

## 2012-05-30 NOTE — Telephone Encounter (Signed)
..   Requested Prescriptions   Signed Prescriptions Disp Refills  . furosemide (LASIX) 80 MG tablet 90 tablet 1    Sig: Take 1 tablet (80 mg total) by mouth as needed.    Authorizing Provider: [name]    Ordering User: Christella Hartigan, Loui Massenburg Judie Petit

## 2012-06-14 ENCOUNTER — Telehealth: Payer: Self-pay | Admitting: Cardiology

## 2012-06-14 ENCOUNTER — Ambulatory Visit (INDEPENDENT_AMBULATORY_CARE_PROVIDER_SITE_OTHER): Payer: Medicare Other | Admitting: Physician Assistant

## 2012-06-14 ENCOUNTER — Encounter: Payer: Self-pay | Admitting: Physician Assistant

## 2012-06-14 ENCOUNTER — Other Ambulatory Visit: Payer: Self-pay | Admitting: Physician Assistant

## 2012-06-14 VITALS — BP 138/78 | HR 111 | Ht 72.0 in | Wt 281.4 lb

## 2012-06-14 DIAGNOSIS — I10 Essential (primary) hypertension: Secondary | ICD-10-CM

## 2012-06-14 DIAGNOSIS — I5022 Chronic systolic (congestive) heart failure: Secondary | ICD-10-CM

## 2012-06-14 DIAGNOSIS — R0602 Shortness of breath: Secondary | ICD-10-CM

## 2012-06-14 LAB — BASIC METABOLIC PANEL
BUN: 22 mg/dL (ref 6–23)
CO2: 25 mEq/L (ref 19–32)
Calcium: 9 mg/dL (ref 8.4–10.5)
Creatinine, Ser: 1.8 mg/dL — ABNORMAL HIGH (ref 0.4–1.5)
GFR: 43.55 mL/min — ABNORMAL LOW (ref >60.00)
Glucose, Bld: 110 mg/dL — ABNORMAL HIGH (ref 70–99)

## 2012-06-14 NOTE — Patient Instructions (Addendum)
INCREASE LASIX TO 80 MG TWICE DAILY FOR 1 WEEK  PLEASE FOLLOW UP WITH Raymond [name], PAC IN 1 WEEK  PLEASE FOLLOW UP WITH DR. HOCHREIN IN 2 MONTHS  YOU HAVE BEEN INSTRUCTED TO WATCH YOUR SALT INTAKE AND FLUID INTAKE; NO MORE THAN 1 LITER OF FLUID DAILY    2 Gram Low Sodium Diet A 2 gram sodium diet restricts the amount of sodium in the diet to no more than 2 g or 2000 mg daily. Limiting the amount of sodium is often used to help lower blood pressure. It is important if you have heart, liver, or kidney problems. Many foods contain sodium for flavor and sometimes as a preservative. When the amount of sodium in a diet needs to be low, it is important to know what to look for when choosing foods and drinks. The following includes some information and guidelines to help make it easier for you to adapt to a low sodium diet. QUICK TIPS  Do not add salt to food.  Avoid convenience items and fast food.  Choose unsalted snack foods.  Buy lower sodium products, often labeled as "lower sodium" or "no salt added."  Check food labels to learn how much sodium is in 1 serving.  When eating at a restaurant, ask that your food be prepared with less salt or none, if possible. READING FOOD LABELS FOR SODIUM INFORMATION The nutrition facts label is a good place to find how much sodium is in foods. Look for products with no more than 500 to 600 mg of sodium per meal and no more than 150 mg per serving. Remember that 2 g = 2000 mg. The food label may also list foods as:  Sodium-free: Less than 5 mg in a serving.  Very low sodium: 35 mg or less in a serving.  Low-sodium: 140 mg or less in a serving.  Light in sodium: 50% less sodium in a serving. For example, if a food that usually has 300 mg of sodium is changed to become light in sodium, it will have 150 mg of sodium.  Reduced sodium: 25% less sodium in a serving. For example, if a food that usually has 400 mg of sodium is changed to reduced sodium,  it will have 300 mg of sodium. CHOOSING FOODS Grains  Avoid: Salted crackers and snack items. Some cereals, including instant hot cereals. Bread stuffing and biscuit mixes. Seasoned rice or pasta mixes.  Choose: Unsalted snack items. Low-sodium cereals, oats, puffed wheat and rice, shredded wheat. English muffins and bread. Pasta. Meats  Avoid: Salted, canned, smoked, spiced, pickled meats, including fish and poultry. Bacon, ham, sausage, cold cuts, hot dogs, anchovies.  Choose: Low-sodium canned tuna and salmon. Fresh or frozen meat, poultry, and fish. Dairy  Avoid: Processed cheese and spreads. Cottage cheese. Buttermilk and condensed milk. Regular cheese.  Choose: Milk. Low-sodium cottage cheese. Yogurt. Sour cream. Low-sodium cheese. Fruits and Vegetables  Avoid: Regular canned vegetables. Regular canned tomato sauce and paste. Frozen vegetables in sauces. Olives. Rosita Fire. Relishes. Sauerkraut.  Choose: Low-sodium canned vegetables. Low-sodium tomato sauce and paste. Frozen or fresh vegetables. Fresh and frozen fruit. Condiments  Avoid: Canned and packaged gravies. Worcestershire sauce. Tartar sauce. Barbecue sauce. Soy sauce. Steak sauce. Ketchup. Onion, garlic, and table salt. Meat flavorings and tenderizers.  Choose: Fresh and dried herbs and spices. Low-sodium varieties of mustard and ketchup. Lemon juice. Tabasco sauce. Horseradish. SAMPLE 2 GRAM SODIUM MEAL PLAN Breakfast / Sodium (mg)  1 cup low-fat milk / 143  mg  2 slices whole-wheat toast / 270 mg  1 tbs heart-healthy margarine / 153 mg  1 hard-boiled egg / 139 mg  1 small orange / 0 mg Lunch / Sodium (mg)  1 cup raw carrots / 76 mg   cup hummus / 298 mg  1 cup low-fat milk / 143 mg   cup red grapes / 2 mg  1 whole-wheat pita bread / 356 mg Dinner / Sodium (mg)  1 cup whole-wheat pasta / 2 mg  1 cup low-sodium tomato sauce / 73 mg  3 oz lean ground beef / 57 mg  1 small side salad (1 cup raw  spinach leaves,  cup cucumber,  cup yellow bell pepper) with 1 tsp olive oil and 1 tsp red wine vinegar / 25 mg Snack / Sodium (mg)  1 container low-fat vanilla yogurt / 107 mg  3 graham cracker squares / 127 mg Nutrient Analysis  Calories: 2033  Protein: 77 g  Carbohydrate: 282 g  Fat: 72 g  Sodium: 1971 mg Document Released: 01/04/2005 Document Revised: 03/29/2011 Document Reviewed: 04/07/2009 [employer] Patient Information 2014 [place], [place].

## 2012-06-14 NOTE — Telephone Encounter (Signed)
Returned call to patient he stated he has been sob,frequent coughing white phlegm for 2 weeks.Stated lasix increased to 80 mg daily on 04/19/12 and he is not any better.Patient stated he does not weigh his self, don't know if he has any weight gain,no swelling noticed.Patient stated he wanted to be seen today.Appointment scheduled with Herma Carson PA today 06/14/12 at 10 :45 am.

## 2012-06-14 NOTE — Assessment & Plan Note (Signed)
stable °

## 2012-06-14 NOTE — Progress Notes (Signed)
HPI:   This is a 52 year old male patient who has a history of a nonischemic cardiomyopathy. He had a 2-D echo on 05/04/12 that showed an ejection fraction of 15% which was worse from the last 2-D echo in 2012 EF was 20-25%. It has been as low as 15% in the past and he does have an ICD. Myoview 5/12 showed barium mild distal anterior and apical ischemia EF 17%. He is also on amiodarone.  The patient has been seen over the past month by urgent care and was felt to be in heart failure Lasix was increased to 80 mg daily,but he has only been taking it 3 times a week. He was also given steroids.  The patient comes in today complaining of shortness of breath, orthopnea,frequent coughing white phlegm, dizziness from constant coughing. He denies eating excessive salt that he eats out frequently at [facility] and [place] as well as fried seafood. He is also drinking excess water to try to ease his coughing.   Allergies: -- Losartan -- Cough  -- Valsartan -- Cough  Current Outpatient Prescriptions on File Prior to Visit: acyclovir (ZOVIRAX) 800 MG tablet, Take 1 tablet (800 mg total) by mouth daily. Take 1/2 tablet by mouth twice daily, Disp: 30 tablet, Rfl: 2 ALPRAZolam (XANAX) 0.25 MG tablet, Take 1 tablet (0.25 mg total) by mouth at bedtime as needed. Need office visit for additional refills., Disp: 30 tablet, Rfl: 5 amiodarone (PACERONE) 200 MG tablet, Take 200 mg by mouth daily. , Disp: , Rfl:  DIGOX 0.125 MG tablet, TAKE 1 TABLET BY MOUTH ONCE DAILY, Disp: 30 tablet, Rfl: 5 furosemide (LASIX) 80 MG tablet, Take 1 tablet (80 mg total) by mouth as needed., Disp: 90 tablet, Rfl: 1 hydrocortisone (ANUSOL-HC) 25 MG suppository, Use one suppository rectally every night x 1 week then as needed, Disp: 30 suppository, Rfl: 0 levothyroxine (SYNTHROID, LEVOTHROID) 200 MCG tablet, 2 tabs daily, Disp: 60 tablet, Rfl: 2 losartan (COZAAR) 50 MG tablet, Take 1 tablet (50 mg total) by mouth daily., Disp: 1 tablet, Rfl:  0 metoprolol (LOPRESSOR) 50 MG tablet, Take 1 tablet (50 mg total) by mouth 2 (two) times daily., Disp: 60 tablet, Rfl: 6 predniSONE (DELTASONE) 20 MG tablet, Take 2 tablets (40 mg total) by mouth daily., Disp: 10 tablet, Rfl: 0 spironolactone (ALDACTONE) 50 MG tablet, TAKE 1/2 TABLET BY MOUTH ONCE DAILY, Disp: 15 tablet, Rfl: 6 warfarin (COUMADIN) 7.5 MG tablet, TAKE AS DIRECTED BY ANTICOAGULATION     CLINIC, Disp: 50 tablet, Rfl: 3  No current facility-administered medications on file prior to visit.   Past Medical History:   Systolic CHF, chronic                                        Non-ischemic cardiomyopathy                                    Comment:a. echo 11/10: EF 15%;   b. cath 10/08: normal               cors;  c. Echo 8/12: EF 20-25%;  d. Myoview               5/12: Very mild distal ant and apical isch, EF               17% (  low risk-medical Rx cont'd);  e.  Echo               4/14:  EF 15%, diff HK, severe diast dysfn,               restrictive physiology, E/medial e' > 15               suggests LVEDP at least 20 mmHg, borderline               dilated Ao root, mild MR, mod LAE, mod reduced               RVSF, mod TR, PASP 64   Atrial fibrillation                                            Comment:ON COUMADIN   Hypertension                                                 Hypothyroidism                                                 Comment:s/p RAI therapy   Obesity                                                      Non-compliance                                               Dyslexia                                                     History of CVA (cerebrovascular accident)       11/2008        Comment: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 tx with tPA and PTA and               stent   RBBB (right bundle branch block)                             Palpitations                                                   Comment:W/WIDE COMPLEX TACHYCARDIA IN THE PAST, REFUSED               REPEATED OFFERS OF AN ICD   Biventricular ICD  st Judes]  Past Surgical History:   INSERT / REPLACE / REMOVE PACEMAKER              06/11/09        Comment:[employer] [id number] BIVENTRICULAR               AICD SERIAL [id number]   KNEE SURGERY                                    Left              CARDIAC CATHETERIZATION                                      Review of patient's family history indicates:   Heart disease                  Father                   Social History   Marital Status: Legally Separated   Spouse Name:                      Years of Education:                 Number of children: 0           Occupational History Occupation          Equities trader                                HOWEVER, HE DOES                                          WORK A LITTLE ON                                          CARS  Social History Main Topics   Smoking Status: Never Smoker                     Smokeless Status: Never Used                       Alcohol Use: No             Drug Use: No             Sexual Activity: Not on file        Other Topics            Concern   None on file  Social History Narrative   LIVES ALONE IN [place],    DISABLED HOWEVER, DOES WORK A LITTLE ON CARS   NO ETOH   NO TOBACCO   NO DRUG ABUSE    VZD:GLOVF poorly in general,see history of present illness otherwise negative   PHYSICAL EXAM: B., in no acute distress. Neck: increased JVD,no HJR, Bruit, or thyroid  enlargement  Lungs: decreased breath sounds throughout with fine crackles at the bases and scattered wheezes  Cardiovascular: RRR, PMI not displaced, heart sounds distant, no murmurs, gallops, bruit, thrill, or heave.  Abdomen: BS normal. Soft without organomegaly, masses, lesions or tenderness.  Extremities: without cyanosis, clubbing or edema. Good distal pulses bilateral  SKin: Warm, no lesions or rashes    Musculoskeletal: No deformities  Neuro: no focal signs  BP 138/78  Pulse 111  Ht 6' (1.829 m)  Wt 281 lb 6.4 oz (127.642 kg)  BMI 38.16 kg/m2    ZOX:[id number] paced 111 beats per minute  2Decho 05/04/12: Study Conclusions  - Left ventricle: The cavity size was severely dilated. Wall   thickness was normal. The estimated ejection fraction was   15%. Diffuse hypokinesis. Doppler parameters are   consistent with restrictive physiology, indicative of   decreased left ventricular diastolic compliance and/or   increased left atrial pressure. E/medial e' > 15 suggests   LV end diastolic pressure at least 20 mmHg. - Aortic valve: There was no stenosis. - Aorta: Borderline dilated aortic root. - Mitral valve: Mild regurgitation. - Left atrium: The atrium was moderately dilated. - Right ventricle: Pacer wire or catheter noted in right   ventricle. Systolic function was moderately reduced. - Tricuspid valve: Moderate regurgitation. Peak RV-RA   gradient: 49mm Hg (S). - Pulmonary arteries: PA peak pressure: 64mm Hg (S). - Systemic veins: IVC measured 2.6 cm with some   respirophasic variation, suggesting RA pressure 15 mmHg. Impressions:  - Severely dilated LV with severe global hypokinesis, EF   15%. Severe diastolic dysfunction with evidence for   elevated LV filling pressure. Normal RV size with moderate   systolic dysfunction. Moderate pulmonary hypertension with   dilated IVC suggesting elevated RV filling pressure.  ------------------------------------------------------------ Labs, prior tests, procedures, and surgery: Echocardiography (August 2012).     EF was 25%.

## 2012-06-14 NOTE — Assessment & Plan Note (Signed)
Patient definitely has evidence of heart failure and exam today. His weight is actually down 3 pounds since his last office visit but he still has quite a bit of fluid on board. We will increase his Lasix to 80 mg b.i.d. For one week and have him checked next week. We will do a bmet and bnp today and next week as well. He is a little tachycardic today by I believe but secondary to all the coughing he is doing. He claims he is taking all of his medications. We will put him on fluid restriction and 2 g sodium diet. His EF has worsened to 15% and he needs to be followed closely.

## 2012-06-14 NOTE — Telephone Encounter (Signed)
New problem   Pts mom called and wants to know if he can be seen today due to coughing and trouble breathing/pt does not have a pcp so she thought he may need to come in

## 2012-06-14 NOTE — Assessment & Plan Note (Signed)
On Amiodarone 

## 2012-06-15 ENCOUNTER — Other Ambulatory Visit: Payer: Self-pay | Admitting: *Deleted

## 2012-06-15 DIAGNOSIS — N289 Disorder of kidney and ureter, unspecified: Secondary | ICD-10-CM

## 2012-06-20 ENCOUNTER — Ambulatory Visit: Payer: Medicare Other | Admitting: Endocrinology

## 2012-06-21 ENCOUNTER — Encounter: Payer: Self-pay | Admitting: Physician Assistant

## 2012-06-21 ENCOUNTER — Telehealth: Payer: Self-pay | Admitting: Cardiology

## 2012-06-21 ENCOUNTER — Ambulatory Visit (INDEPENDENT_AMBULATORY_CARE_PROVIDER_SITE_OTHER): Payer: Medicare Other | Admitting: Physician Assistant

## 2012-06-21 ENCOUNTER — Other Ambulatory Visit (INDEPENDENT_AMBULATORY_CARE_PROVIDER_SITE_OTHER): Payer: Medicare Other

## 2012-06-21 ENCOUNTER — Encounter: Payer: Self-pay | Admitting: Endocrinology

## 2012-06-21 ENCOUNTER — Ambulatory Visit (INDEPENDENT_AMBULATORY_CARE_PROVIDER_SITE_OTHER): Payer: Medicare Other | Admitting: Endocrinology

## 2012-06-21 ENCOUNTER — Other Ambulatory Visit: Payer: Self-pay | Admitting: Cardiology

## 2012-06-21 ENCOUNTER — Inpatient Hospital Stay (HOSPITAL_COMMUNITY)
Admission: AD | Admit: 2012-06-21 | Discharge: 2012-06-25 | DRG: 308 | Disposition: A | Discharge status: Home Health | Payer: Medicare Other | Source: Ambulatory Visit | Attending: Cardiology | Admitting: Cardiology

## 2012-06-21 VITALS — BP 102/78 | HR 128 | Ht 69.0 in | Wt 274.0 lb

## 2012-06-21 VITALS — BP 128/74 | HR 68 | Ht 69.0 in | Wt 274.0 lb

## 2012-06-21 DIAGNOSIS — Z7901 Long term (current) use of anticoagulants: Secondary | ICD-10-CM

## 2012-06-21 DIAGNOSIS — R079 Chest pain, unspecified: Secondary | ICD-10-CM

## 2012-06-21 DIAGNOSIS — Z95810 Presence of automatic (implantable) cardiac defibrillator: Secondary | ICD-10-CM

## 2012-06-21 DIAGNOSIS — R0602 Shortness of breath: Secondary | ICD-10-CM

## 2012-06-21 DIAGNOSIS — I509 Heart failure, unspecified: Secondary | ICD-10-CM | POA: Diagnosis present

## 2012-06-21 DIAGNOSIS — N189 Chronic kidney disease, unspecified: Secondary | ICD-10-CM | POA: Diagnosis present

## 2012-06-21 DIAGNOSIS — I10 Essential (primary) hypertension: Secondary | ICD-10-CM

## 2012-06-21 DIAGNOSIS — I428 Other cardiomyopathies: Secondary | ICD-10-CM | POA: Diagnosis present

## 2012-06-21 DIAGNOSIS — Z9119 Patient's noncompliance with other medical treatment and regimen: Secondary | ICD-10-CM

## 2012-06-21 DIAGNOSIS — E890 Postprocedural hypothyroidism: Secondary | ICD-10-CM | POA: Diagnosis present

## 2012-06-21 DIAGNOSIS — I69998 Other sequelae following unspecified cerebrovascular disease: Secondary | ICD-10-CM

## 2012-06-21 DIAGNOSIS — Z91199 Patient's noncompliance with other medical treatment and regimen due to unspecified reason: Secondary | ICD-10-CM

## 2012-06-21 DIAGNOSIS — R791 Abnormal coagulation profile: Secondary | ICD-10-CM | POA: Diagnosis present

## 2012-06-21 DIAGNOSIS — I129 Hypertensive chronic kidney disease with stage 1 through stage 4 chronic kidney disease, or unspecified chronic kidney disease: Secondary | ICD-10-CM | POA: Diagnosis present

## 2012-06-21 DIAGNOSIS — I4891 Unspecified atrial fibrillation: Principal | ICD-10-CM

## 2012-06-21 DIAGNOSIS — I5023 Acute on chronic systolic (congestive) heart failure: Secondary | ICD-10-CM

## 2012-06-21 DIAGNOSIS — R0989 Other specified symptoms and signs involving the circulatory and respiratory systems: Secondary | ICD-10-CM

## 2012-06-21 DIAGNOSIS — E669 Obesity, unspecified: Secondary | ICD-10-CM | POA: Diagnosis present

## 2012-06-21 DIAGNOSIS — R29898 Other symptoms and signs involving the musculoskeletal system: Secondary | ICD-10-CM | POA: Diagnosis present

## 2012-06-21 DIAGNOSIS — Z79899 Other long term (current) drug therapy: Secondary | ICD-10-CM

## 2012-06-21 DIAGNOSIS — I4510 Unspecified right bundle-branch block: Secondary | ICD-10-CM | POA: Diagnosis present

## 2012-06-21 DIAGNOSIS — N289 Disorder of kidney and ureter, unspecified: Secondary | ICD-10-CM

## 2012-06-21 DIAGNOSIS — E039 Hypothyroidism, unspecified: Secondary | ICD-10-CM

## 2012-06-21 DIAGNOSIS — Z6841 Body Mass Index (BMI) 40.0 and over, adult: Secondary | ICD-10-CM

## 2012-06-21 LAB — COMPREHENSIVE METABOLIC PANEL
ALT: 17 U/L (ref 0–53)
AST: 18 U/L (ref 0–37)
Alkaline Phosphatase: 93 U/L (ref 39–117)
CO2: 27 mEq/L (ref 19–32)
Chloride: 99 mEq/L (ref 96–112)
Creatinine, Ser: 1.48 mg/dL — ABNORMAL HIGH (ref 0.50–1.35)
GFR calc non Af Amer: 54 mL/min — ABNORMAL LOW (ref >90)
Potassium: 3.5 mEq/L (ref 3.5–5.1)
Total Bilirubin: 0.4 mg/dL (ref 0.3–1.2)

## 2012-06-21 LAB — CBC WITH DIFFERENTIAL/PLATELET
Basophils Absolute: 0 10*3/uL (ref 0.0–0.1)
HCT: 40.9 % (ref 39.0–52.0)
Hemoglobin: 13.8 g/dL (ref 13.0–17.0)
Lymphocytes Relative: 12 % (ref 12–46)
Monocytes Absolute: 0.7 10*3/uL (ref 0.1–1.0)
Monocytes Relative: 8 % (ref 3–12)
Neutro Abs: 7 10*3/uL (ref 1.7–7.7)
RDW: 14 % (ref 11.5–15.5)
WBC: 8.9 10*3/uL (ref 4.0–10.5)

## 2012-06-21 MED ORDER — LEVOTHYROXINE SODIUM 200 MCG PO TABS
400.0000 ug | ORAL_TABLET | Freq: Every day | ORAL | Status: DC
  Administered 2012-06-22 – 2012-06-25 (×4): 400 ug via ORAL
  Filled 2012-06-21 (×5): qty 2

## 2012-06-21 MED ORDER — GUAIFENESIN-CODEINE 100-10 MG/5ML PO SOLN
5.0000 mL | ORAL | Status: DC | PRN
  Administered 2012-06-22 – 2012-06-24 (×4): 5 mL via ORAL
  Filled 2012-06-21 (×4): qty 5

## 2012-06-21 MED ORDER — ACYCLOVIR 800 MG PO TABS
800.0000 mg | ORAL_TABLET | Freq: Every day | ORAL | Status: DC
  Administered 2012-06-21 – 2012-06-25 (×4): 800 mg via ORAL
  Filled 2012-06-21 (×5): qty 1

## 2012-06-21 MED ORDER — SODIUM CHLORIDE 0.9 % IV SOLN: 250.0000 mL | INTRAVENOUS | Status: DC | PRN

## 2012-06-21 MED ORDER — WARFARIN - PHARMACIST DOSING INPATIENT
Freq: Every day | Status: DC
  Administered 2012-06-22 – 2012-06-23 (×2)

## 2012-06-21 MED ORDER — HEPARIN (PORCINE) IN NACL 100-0.45 UNIT/ML-% IJ SOLN
1500.0000 [IU]/h | INTRAMUSCULAR | Status: DC
  Administered 2012-06-22 (×2): 1600 [IU]/h via INTRAVENOUS
  Administered 2012-06-23: 1500 [IU]/h via INTRAVENOUS
  Filled 2012-06-21 (×5): qty 250

## 2012-06-21 MED ORDER — SODIUM CHLORIDE 0.9 % IJ SOLN
3.0000 mL | Freq: Two times a day (BID) | INTRAMUSCULAR | Status: DC
  Administered 2012-06-21 – 2012-06-24 (×4): 3 mL via INTRAVENOUS

## 2012-06-21 MED ORDER — ACETAMINOPHEN 325 MG PO TABS: 650.0000 mg | ORAL_TABLET | ORAL | Status: DC | PRN

## 2012-06-21 MED ORDER — SPIRONOLACTONE 25 MG PO TABS
25.0000 mg | ORAL_TABLET | Freq: Every day | ORAL | Status: DC
  Administered 2012-06-21 – 2012-06-25 (×4): 25 mg via ORAL
  Filled 2012-06-21 (×5): qty 1

## 2012-06-21 MED ORDER — POTASSIUM CHLORIDE CRYS ER 20 MEQ PO TBCR
20.0000 meq | EXTENDED_RELEASE_TABLET | Freq: Every day | ORAL | Status: DC
  Administered 2012-06-21 – 2012-06-25 (×4): 20 meq via ORAL
  Filled 2012-06-21 (×5): qty 1

## 2012-06-21 MED ORDER — METOPROLOL TARTRATE 25 MG PO TABS
25.0000 mg | ORAL_TABLET | Freq: Two times a day (BID) | ORAL | Status: DC
  Administered 2012-06-21 – 2012-06-25 (×7): 25 mg via ORAL
  Filled 2012-06-21 (×9): qty 1

## 2012-06-21 MED ORDER — HYDROCORTISONE ACETATE 25 MG RE SUPP
25.0000 mg | Freq: Two times a day (BID) | RECTAL | Status: DC | PRN
  Filled 2012-06-21: qty 1

## 2012-06-21 MED ORDER — AMIODARONE HCL 200 MG PO TABS
200.0000 mg | ORAL_TABLET | Freq: Two times a day (BID) | ORAL | Status: DC
  Administered 2012-06-21 – 2012-06-25 (×7): 200 mg via ORAL
  Filled 2012-06-21 (×9): qty 1

## 2012-06-21 MED ORDER — SODIUM CHLORIDE 0.9 % IJ SOLN: 3.0000 mL | INTRAMUSCULAR | Status: DC | PRN

## 2012-06-21 MED ORDER — DIGOXIN 125 MCG PO TABS
0.1250 mg | ORAL_TABLET | Freq: Every day | ORAL | Status: DC
  Administered 2012-06-21 – 2012-06-25 (×4): 0.125 mg via ORAL
  Filled 2012-06-21 (×5): qty 1

## 2012-06-21 MED ORDER — ONDANSETRON HCL 4 MG/2ML IJ SOLN: 4.0000 mg | Freq: Four times a day (QID) | INTRAMUSCULAR | Status: DC | PRN

## 2012-06-21 MED ORDER — FUROSEMIDE 10 MG/ML IJ SOLN
80.0000 mg | Freq: Two times a day (BID) | INTRAMUSCULAR | Status: DC
  Administered 2012-06-21 – 2012-06-23 (×4): 80 mg via INTRAVENOUS
  Filled 2012-06-21 (×8): qty 8

## 2012-06-21 MED ORDER — HEPARIN BOLUS VIA INFUSION
2000.0000 [IU] | Freq: Once | INTRAVENOUS | Status: AC
  Administered 2012-06-22: 2000 [IU] via INTRAVENOUS
  Filled 2012-06-21: qty 2000

## 2012-06-21 MED ORDER — LOSARTAN POTASSIUM 50 MG PO TABS
50.0000 mg | ORAL_TABLET | Freq: Every day | ORAL | Status: DC
  Administered 2012-06-21 – 2012-06-25 (×3): 50 mg via ORAL
  Filled 2012-06-21 (×5): qty 1

## 2012-06-21 MED ORDER — WARFARIN SODIUM 7.5 MG PO TABS
7.5000 mg | ORAL_TABLET | Freq: Once | ORAL | Status: AC
  Administered 2012-06-22: 7.5 mg via ORAL
  Filled 2012-06-21: qty 1

## 2012-06-21 MED ORDER — ALPRAZOLAM 0.25 MG PO TABS: 0.2500 mg | ORAL_TABLET | Freq: Every evening | ORAL | Status: DC | PRN

## 2012-06-21 NOTE — Progress Notes (Signed)
Patient has arrived to unit as a direct admit, will continue to monitor patient. Lorretta Harp RN

## 2012-06-21 NOTE — Patient Instructions (Addendum)
YOU HAVE BEEN ADMITTED TO [facility] UNIT

## 2012-06-21 NOTE — Telephone Encounter (Signed)
..   Requested Prescriptions   Pending Prescriptions Disp Refills  . spironolactone (ALDACTONE) 50 MG tablet [Pharmacy Med Name: SPIRONOLACTONE 50MG  TAB] 45 tablet 0    Sig: TAKE 1/2 TABLET BY MOUTH ONCE DAILY

## 2012-06-21 NOTE — Progress Notes (Signed)
Subjective:    Patient ID: Raymond Cullens., male    DOB: August 27, 1960, 52 y.o.   MRN: [id number]  HPI Pt states he was dx'ed with hyperthyroidism in 1999. He describes what sounds like i-131 rx, in approx 2002. He has been on amiodarone since approx 2004.  He has required serial adjustments in his synthroid over the past few years.  He requires a very high dosage of synthroid, probably due to amiodarone.  He had sob last week, but it is resolved now.   Past Medical History  Diagnosis Date  . Systolic CHF, chronic   . Non-ischemic cardiomyopathy     a. echo 11/10: EF 15%;   b. cath 10/08: normal cors;  c. Echo 8/12: EF 20-25%;  d. Myoview 5/12: Very mild distal ant and apical isch, EF 17% (low risk-medical Rx cont'd);  e.  Echo 4/14:  EF 15%, diff HK, severe diast dysfn, restrictive physiology, E/medial e' > 15 suggests LVEDP at least 20 mmHg, borderline dilated Ao root, mild MR, mod LAE, mod reduced RVSF, mod TR, PASP 64  . Atrial fibrillation     ON COUMADIN  . Hypertension   . Hypothyroidism     s/p RAI therapy  . Obesity   . Non-compliance   . Dyslexia   . 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 RBBB (right bundle branch block)   . Palpitations     W/WIDE COMPLEX TACHYCARDIA IN THE PAST, REFUSED REPEATED OFFERS OF AN ICD  . Biventricular ICD  st Judes]     Past Surgical History  Procedure Laterality Date  . Insert / replace / remove pacemaker  06/11/09    [employer] [id number] BIVENTRICULAR AICD SERIAL [phone]  . Knee surgery Left   . Cardiac catheterization      History   Social History  . Marital Status: Legally Separated    Spouse Name: N/A    Number of Children: 0  . Years of Education: N/A   Occupational History  . DISABLED     HOWEVER, HE DOES WORK A LITTLE ON CARS   Social History Main Topics  . Smoking status: Never Smoker   . Smokeless tobacco: Never Used  . Alcohol Use: No  .  Drug Use: No  . Sexually Active: Not on file   Other Topics Concern  . Not on file   Social History Narrative   LIVES ALONE IN [place], [place]   DISABLED HOWEVER, DOES WORK A LITTLE ON CARS   NO ETOH   NO TOBACCO   NO DRUG ABUSE    No current facility-administered medications on file prior to visit.   Current Outpatient Prescriptions on File Prior to Visit  Medication Sig Dispense Refill  . acyclovir (ZOVIRAX) 800 MG tablet Take 800 mg by mouth daily.      Marland Kitchen ALPRAZolam (XANAX) 0.25 MG tablet Take 1 tablet (0.25 mg total) by mouth at bedtime as needed. Need office visit for additional refills.  30 tablet  5  . amiodarone (PACERONE) 200 MG tablet Take 200 mg by mouth daily.       Marland Kitchen DIGOX 0.125 MG tablet TAKE 1 TABLET BY MOUTH ONCE DAILY  30 tablet  5  . hydrocortisone (ANUSOL-HC) 25 MG suppository Use one suppository rectally every night x 1 week then as needed  30 suppository  0  . levothyroxine (SYNTHROID, LEVOTHROID) 200 MCG tablet 2 tabs  daily  60 tablet  2  . losartan (COZAAR) 50 MG tablet Take 1 tablet (50 mg total) by mouth daily.  1 tablet  0  . metoprolol (LOPRESSOR) 50 MG tablet Take 50 mg by mouth daily.      Marland Kitchen warfarin (COUMADIN) 7.5 MG tablet TAKE AS DIRECTED BY ANTICOAGULATION     CLINIC  50 tablet  3    Allergies  Allergen Reactions  . Losartan Cough  . Valsartan Cough    Family History  Problem Relation Age of Onset  . Heart disease Father     BP 128/74  Pulse 68  Ht 5\' 9"  (1.753 m)  Wt 274 lb (124.286 kg)  BMI 40.44 kg/m2  SpO2 98%  Review of Systems He has lost a few lbs, possibly due to increased lasix.     Objective:   Physical Exam VITAL SIGNS:  See vs page GENERAL: no distress NECK: There is no palpable thyroid enlargement.  No thyroid nodule is palpable.  No palpable lymphadenopathy at the anterior neck.  Lab Results  Component Value Date   TSH 1.862 04/28/2012   T3TOTAL 64.1* 07/30/2009   T4TOTAL 5.7 07/23/2009      Assessment & Plan:   Hypothyroidism, apparently due to i-131 rx.  His high requirement for synthroid is probably due to the amiodarone.  Well-replaced

## 2012-06-21 NOTE — Telephone Encounter (Signed)
Attempted to answer questions r/t pts care based on the notes in EPIC as I was not involved in the pts care today  - mother aware pt was admitted d/t A Fib and is to possibly have a cardioversion tomorrow.  She thanked me for my time.  She will contact the pt and or nursing station for further information.

## 2012-06-21 NOTE — Telephone Encounter (Signed)
New problem   pts mother calling to get information regarding pt being sent to the hospital

## 2012-06-21 NOTE — Progress Notes (Addendum)
ANTICOAGULATION CONSULT NOTE - Initial Consult   Pharmacy Consult for Heparin and Warfarin Indication: atrial fibrillation  Allergies  Allergen Reactions  . Losartan Cough  . Valsartan Cough   Patient Measurements: Height: 5\' 9"  (175.3 cm) Weight: 272 lb 3.2 oz (123.469 kg) (scale B) IBW/kg (Calculated) : 70.7  Vital Signs: Temp: 98 F (36.7 C) (06/04 2132) Temp src: Oral (06/04 2132) BP: 116/78 mmHg (06/04 2122) Pulse Rate: 108 (06/04 2132)  Labs:  Recent Labs  06/21/12 2048  HGB 13.8  HCT 40.9  PLT 276  LABPROT 19.0*  INR 1.65*   Estimated Creatinine Clearance: 64.5 ml/min (by C-G formula based on Cr of 1.8).  Medical History: Past Medical History  Diagnosis Date  . Systolic CHF, chronic   . Non-ischemic cardiomyopathy     a. echo 11/10: EF 15%;   b. cath 10/08: normal cors;  c. Echo 8/12: EF 20-25%;  d. Myoview 5/12: Very mild distal ant and apical isch, EF 17% (low risk-medical Rx cont'd);  e.  Echo 4/14:  EF 15%, diff HK, severe diast dysfn, restrictive physiology, E/medial e' > 15 suggests LVEDP at least 20 mmHg, borderline dilated Ao root, mild MR, mod LAE, mod reduced RVSF, mod TR, PASP 64  . Atrial fibrillation     ON COUMADIN  . Hypertension   . Hypothyroidism     s/p RAI therapy  . Obesity   . Non-compliance   . Dyslexia   . 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 RBBB (right bundle branch block)   . Palpitations     W/WIDE COMPLEX TACHYCARDIA IN THE PAST, REFUSED REPEATED OFFERS OF AN ICD  . Biventricular ICD  st Judes]    Medications:  Prescriptions prior to admission  Medication Sig Dispense Refill  . acyclovir (ZOVIRAX) 800 MG tablet Take 800 mg by mouth daily.      Marland Kitchen ALPRAZolam (XANAX) 0.25 MG tablet Take 0.25 mg by mouth at bedtime as needed for sleep.      Marland Kitchen amiodarone (PACERONE) 200 MG tablet Take 200 mg by mouth daily.       . digoxin (LANOXIN) 0.125 MG tablet Take  0.125 mg by mouth daily.      . furosemide (LASIX) 80 MG tablet Take 80 mg by mouth 2 (two) times daily.      Marland Kitchen levothyroxine (SYNTHROID, LEVOTHROID) 200 MCG tablet Take 400 mcg by mouth daily before breakfast.      . losartan (COZAAR) 50 MG tablet Take 1 tablet (50 mg total) by mouth daily.  1 tablet  0  . metoprolol (LOPRESSOR) 50 MG tablet Take 50 mg by mouth daily.      Marland Kitchen spironolactone (ALDACTONE) 50 MG tablet Take 25 mg by mouth daily.       Assessment: 52yo male with hx. Of Afib and stroke on chronic Warfarin therapy.  He is to be started on IV heparin for sub-therapeutic INR (1.65).  He is without noted bleeding complications and will likely have cardioversion tomorrow.  We have been asked to assist with monitoring of his anticoagulation.   HOME Dose: warfarin (COUMADIN) 7.5 MG tablet Take 7.5-11.25 mg by mouth daily. Takes 7.5mg  every day except for 11.25mg  on Friday   DRUG/DRUG Interacting medications Amiodarone + Warfarin - may potentiate INR   Goal of Therapy:  INR 2-3 Heparin level 0.3-0.7 Monitor platelets by anticoagulation protocol: Yes   Plan:  1.  Heparin 2000 unit bolus and start infusion at 1600 units/hr 2.  Daily Heparin levels, CBC 3.  Warfarin 7.5mg  x 1 tonight 4.  [place]aly PT/INR 5.  Monitor bleeding complications  Nadara Mustard, PharmD., MS Clinical Pharmacist Pager:  [phone] Thank you for allowing pharmacy to be part of this patients care team. 06/21/2012,10:08 PM

## 2012-06-21 NOTE — Patient Instructions (Addendum)
blood tests are being requested for you today.  We'll contact you with results. Please come back for a follow-up appointment in 4 months.    

## 2012-06-21 NOTE — H&P (Signed)
Admission History and Physical  Date:  06/21/2012   ID:  Raymond Cullens., DOB Jun 02, 1960, MRN [id number]  PCP:  Dow Adolph, MD  Primary Cardiologist:  Dr. [name]     History of Present Illness: Raymond Dehoyos. is a 52 y.o. male who returns for follow up. 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 Myoview 5/12: Very mild distal anterior and apical ischemia, EF 17% (low risk-medical therapy continued). Echo 8/12: EF 20-25%, diffuse HK, grade 1 diastolic dysfunction, mild LAE.  He is on amiodarone.  TSH was previously noted to be very high and he was referred back to endocrinology.  Synthroid was increased and he was kept on amiodarone.   F/u Echo 4/14:  EF 15%, diff HK, severe diast dysfn, restrictive physiology, E/medial e' > 15 suggests LVEDP at least 20 mmHg, borderline dilated Ao root, mild MR, mod LAE, mod reduced RVSF, mod TR, PASP 64.  He was seen by Leda Gauze, PA-C last week.  He had been seen at urgent care recently for heart failure. Lasix had been increased. Notes indicate the patient was taking the same correctly. He was also given prednisone. [name] and set him up for follow up today.  Since last being seen, his weight is down 7 pounds. He continues to note significant cough. He denies shortness of breath. His girlfriend, who is with him today, notes significant dyspnea with minimal exertion. He notes 3 pillow orthopnea as well as PND.  Edema has decreased. He denies increased abdominal girth. He's had some occasional chest pains. He denies exertional chest pain. He denies syncope or near-syncope.  Labs (1/14):   K 4.3, creatinine 1.5, TSH 38.76, T4 0.42, T3 1.7 Labs (3/14):   TSH 23.66 Labs (4/14):   TSH 1.862 Labs (5/14):   K 3.8, Cr 1.8, BNP 658  Wt Readings from Last 3 Encounters:  06/21/12 274 lb (124.286 kg)   06/21/12 274 lb (124.286 kg)  06/14/12 281 lb 6.4 oz (127.642 kg)     Past Medical History  Diagnosis Date  . Systolic CHF, chronic   . Non-ischemic cardiomyopathy     a. echo 11/10: EF 15%;   b. cath 10/08: normal cors;  c. Echo 8/12: EF 20-25%;  d. Myoview 5/12: Very mild distal ant and apical isch, EF 17% (low risk-medical Rx cont'd);  e.  Echo 4/14:  EF 15%, diff HK, severe diast dysfn, restrictive physiology, E/medial e' > 15 suggests LVEDP at least 20 mmHg, borderline dilated Ao root, mild MR, mod LAE, mod reduced RVSF, mod TR, PASP 64  . Atrial fibrillation     ON COUMADIN  . Hypertension   . Hypothyroidism     s/p RAI therapy  . Obesity   . Non-compliance   . Dyslexia   . 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 RBBB (right bundle branch block)   . Palpitations     W/WIDE COMPLEX TACHYCARDIA IN THE PAST, REFUSED REPEATED OFFERS OF AN ICD  . Biventricular ICD  st Judes]     Current Outpatient Prescriptions  Medication Sig Dispense Refill  . acyclovir (ZOVIRAX) 800 MG tablet Take 800 mg by mouth daily.      Marland Kitchen ALPRAZolam (XANAX) 0.25 MG tablet Take 1 tablet (0.25 mg total) by mouth at bedtime  as needed. Need office visit for additional refills.  30 tablet  5  . amiodarone (PACERONE) 200 MG tablet Take 200 mg by mouth daily.       Marland Kitchen DIGOX 0.125 MG tablet TAKE 1 TABLET BY MOUTH ONCE DAILY  30 tablet  5  . furosemide (LASIX) 80 MG tablet Take 80 mg by mouth 2 (two) times daily.      . hydrocortisone (ANUSOL-HC) 25 MG suppository Use one suppository rectally every night x 1 week then as needed  30 suppository  0  . levothyroxine (SYNTHROID, LEVOTHROID) 200 MCG tablet 2 tabs daily  60 tablet  2  . losartan (COZAAR) 50 MG tablet Take 1 tablet (50 mg total) by mouth daily.  1 tablet  0  . metoprolol (LOPRESSOR) 50 MG tablet Take 50 mg by mouth daily.      Marland Kitchen spironolactone (ALDACTONE) 50 MG tablet TAKE 1/2 TABLET BY  MOUTH ONCE DAILY  45 tablet  0  . warfarin (COUMADIN) 7.5 MG tablet TAKE AS DIRECTED BY ANTICOAGULATION     CLINIC  50 tablet  3   No current facility-administered medications for this visit.    Allergies:    Allergies  Allergen Reactions  . Losartan Cough  . Valsartan Cough    Social History:  The patient  reports that he has never smoked. He has never used smokeless tobacco. He reports that he does not drink alcohol or use illicit drugs.   Family History: The patient's family history includes Heart disease in his father.   ROS:  See the HPI.  He has significant cough with production of phlegm. He denies hemoptysis. He denies fever.  All other systems reviewed and negative.   PHYSICAL EXAM: VS:  BP 102/78  Pulse 128  Ht 5\' 9"  (1.753 m)  Wt 274 lb (124.286 kg)  BMI 40.44 kg/m2 Well nourished, well developed, in no acute distress HEENT: normal. Neck: + JVD Cardiac:  normal S1, S2; irregularly irregular rhythm; no murmur Lungs:  clear to auscultation bilaterally, no wheezing, rhonchi or rales Abd: soft, nontender, no hepatomegaly Ext: trace bilateral LE edema Skin: warm and dry Neuro:  CNs 2-12 intact, no focal abnormalities noted  EKG:  AFib, HR 128  ASSESSMENT AND PLAN:  1. Acute on Chronic Systolic CHF:  Volume is improved.  However, he is in AFib with RVR.  With EF 15%, he will likely not tolerate this rhythm much longer.  Recommend admission to the hospital.  D/w Dr. Shirlee Latch (DOD) who agreed.  Will change Lasix to 80 mg IV BID.  Check BMET, LFTs, CBC, BNP.  Keep close eye on renal Fxn and K+. 2. Atrial Fibrillation with RVR:  Increase amiodarone to 200 mg bid.  Adjust metoprolol to 25 mg bid.  Check INR.  Start Heparin IV if INR < 2.  Keep NPO after MN.  Plan probable TEE-DCCV tomorrow.  3. Hypothyroidism:  Check f/u TSH.  4. Hypertension:  Controlled.  5. Disposition:  Admit to [facility] today.    Signed, Tereso Newcomer, PA-C  06/21/2012 3:41 PM

## 2012-06-21 NOTE — Progress Notes (Signed)
[address], [address] [place], [place]  [address] Phone: [phone], Fax:  [phone]  Date:  06/21/2012   ID:  Raymond [name]., DOB [birth year], MRN [id number]  PCP:  Dow Adolph, MD  Primary Cardiologist:  Dr. [name]     History of Present Illness: Raymond [name]. is a 52 y.o. male who returns for follow up. 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 Myoview 5/12: Very mild distal anterior and apical ischemia, EF 17% (low risk-medical therapy continued). Echo 8/12: EF 20-25%, diffuse HK, grade 1 diastolic dysfunction, mild LAE.  He is on amiodarone.  TSH was previously noted to be very high and he was referred back to endocrinology.  Synthroid was increased and he was kept on amiodarone.   F/u Echo 4/14:  EF 15%, diff HK, severe diast dysfn, restrictive physiology, E/medial e' > 15 suggests LVEDP at least 20 mmHg, borderline dilated Ao root, mild MR, mod LAE, mod reduced RVSF, mod TR, PASP 64.  He was seen by Leda Gauze, PA-C last week.  He had been seen at urgent care recently for heart failure. Lasix had been increased. Notes indicate the patient was taking the same correctly. He was also given prednisone. [name] and set him up for follow up today.  Since last being seen, his weight is down 7 pounds. He continues to note significant cough. He denies shortness of breath. His girlfriend, who is with him today, notes significant dyspnea with minimal exertion. He notes 3 pillow orthopnea as well as PND.  Edema has decreased. He denies increased abdominal girth. He's had some occasional chest pains. He denies exertional chest pain. He denies syncope or near-syncope.  Labs (1/14):   K 4.3, creatinine 1.5, TSH 38.76, T4 0.42, T3 1.7 Labs (3/14):   TSH 23.66 Labs (4/14):   TSH 1.862 Labs (5/14):   K 3.8, Cr 1.8, BNP  658  Wt Readings from Last 3 Encounters:  06/21/12 274 lb (124.286 kg)  06/21/12 274 lb (124.286 kg)  06/14/12 281 lb 6.4 oz (127.642 kg)     Past Medical History  Diagnosis Date  . Systolic CHF, chronic   . Non-ischemic cardiomyopathy     a. echo 11/10: EF 15%;   b. cath 10/08: normal cors;  c. Echo 8/12: EF 20-25%;  d. Myoview 5/12: Very mild distal ant and apical isch, EF 17% (low risk-medical Rx cont'd);  e.  Echo 4/14:  EF 15%, diff HK, severe diast dysfn, restrictive physiology, E/medial e' > 15 suggests LVEDP at least 20 mmHg, borderline dilated Ao root, mild MR, mod LAE, mod reduced RVSF, mod TR, PASP 64  . Atrial fibrillation     ON COUMADIN  . Hypertension   . Hypothyroidism     s/p RAI therapy  . Obesity   . Non-compliance   . Dyslexia   . 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 RBBB (right bundle branch block)   . Palpitations     W/WIDE COMPLEX TACHYCARDIA IN THE PAST, REFUSED REPEATED OFFERS OF AN ICD  . Biventricular ICD  st Judes]     Current Outpatient Prescriptions  Medication Sig Dispense Refill  . acyclovir (ZOVIRAX) 800 MG tablet Take 800 mg by mouth daily.      Marland Kitchen ALPRAZolam Prudy Feeler)  0.25 MG tablet Take 1 tablet (0.25 mg total) by mouth at bedtime as needed. Need office visit for additional refills.  30 tablet  5  . amiodarone (PACERONE) 200 MG tablet Take 200 mg by mouth daily.       Marland Kitchen DIGOX 0.125 MG tablet TAKE 1 TABLET BY MOUTH ONCE DAILY  30 tablet  5  . furosemide (LASIX) 80 MG tablet Take 80 mg by mouth 2 (two) times daily.      . hydrocortisone (ANUSOL-HC) 25 MG suppository Use one suppository rectally every night x 1 week then as needed  30 suppository  0  . levothyroxine (SYNTHROID, LEVOTHROID) 200 MCG tablet 2 tabs daily  60 tablet  2  . losartan (COZAAR) 50 MG tablet Take 1 tablet (50 mg total) by mouth daily.  1 tablet  0  . metoprolol (LOPRESSOR) 50 MG tablet Take 50 mg by mouth  daily.      Marland Kitchen spironolactone (ALDACTONE) 50 MG tablet TAKE 1/2 TABLET BY MOUTH ONCE DAILY  45 tablet  0  . warfarin (COUMADIN) 7.5 MG tablet TAKE AS DIRECTED BY ANTICOAGULATION     CLINIC  50 tablet  3   No current facility-administered medications for this visit.    Allergies:    Allergies  Allergen Reactions  . Losartan Cough  . Valsartan Cough    Social History:  The patient  reports that he has never smoked. He has never used smokeless tobacco. He reports that he does not drink alcohol or use illicit drugs.   Family History: The patient's family history includes Heart disease in his father.   ROS:  See the HPI.  He has significant cough with production of phlegm. He denies hemoptysis. He denies fever.  All other systems reviewed and negative.   PHYSICAL EXAM: VS:  BP 102/78  Pulse 128  Ht 5\' 9"  (1.753 m)  Wt 274 lb (124.286 kg)  BMI 40.44 kg/m2 Well nourished, well developed, in no acute distress HEENT: normal. Neck: + JVD Cardiac:  normal S1, S2; irregularly irregular rhythm; no murmur Lungs:  clear to auscultation bilaterally, no wheezing, rhonchi or rales Abd: soft, nontender, no hepatomegaly Ext: trace bilateral LE edema Skin: warm and dry Neuro:  CNs 2-12 intact, no focal abnormalities noted  EKG:  AFib, HR 128  ASSESSMENT AND PLAN:  1. Acute on Chronic Systolic CHF:  Volume is improved.  However, he is in AFib with RVR.  With EF 15%, he will likely not tolerate this rhythm much longer.  Recommend admission to the hospital.  D/w Dr. Shirlee Latch (DOD) who agreed.  Will change Lasix to 80 mg IV BID.  Check BMET, LFTs, CBC, BNP.  Keep close eye on renal Fxn and K+. 2. Atrial Fibrillation with RVR:  Increase amiodarone to 200 mg bid.  Adjust metoprolol to 25 mg bid.  Check INR.  Start Heparin IV if INR < 2.  Keep NPO after MN.  Plan probable TEE-DCCV tomorrow.  3. Hypothyroidism:  Check f/u TSH.  4. Hypertension:  Controlled.  5. Disposition:  Admit to [facility] today.      Signed, Tereso Newcomer, PA-C  06/21/2012 3:41 PM

## 2012-06-22 ENCOUNTER — Encounter (HOSPITAL_COMMUNITY): Payer: Self-pay | Admitting: General Practice

## 2012-06-22 ENCOUNTER — Inpatient Hospital Stay (HOSPITAL_COMMUNITY): Payer: Medicare Other

## 2012-06-22 ENCOUNTER — Encounter (HOSPITAL_COMMUNITY)
Admission: AD | Disposition: A | Discharge status: Home Health | Payer: Self-pay | Source: Ambulatory Visit | Attending: Cardiology

## 2012-06-22 DIAGNOSIS — I4891 Unspecified atrial fibrillation: Principal | ICD-10-CM

## 2012-06-22 LAB — CBC
Hemoglobin: 14.5 g/dL (ref 13.0–17.0)
MCH: 30.3 pg (ref 26.0–34.0)
RBC: 4.78 MIL/uL (ref 4.22–5.81)
WBC: 8.2 10*3/uL (ref 4.0–10.5)

## 2012-06-22 LAB — DIGOXIN LEVEL: Digoxin Level: 0.5 ng/mL — ABNORMAL LOW (ref 0.8–2.0)

## 2012-06-22 LAB — BASIC METABOLIC PANEL
Calcium: 9.1 mg/dL (ref 8.4–10.5)
Creatinine, Ser: 1.37 mg/dL — ABNORMAL HIGH (ref 0.50–1.35)
GFR calc Af Amer: 69 mL/min — ABNORMAL LOW (ref >90)

## 2012-06-22 LAB — TROPONIN I: Troponin I: <0.3 ng/mL (ref <0.30)

## 2012-06-22 LAB — TSH: TSH: 0.999 u[IU]/mL (ref 0.350–4.500)

## 2012-06-22 LAB — APTT: aPTT: 130 seconds — ABNORMAL HIGH (ref 24–37)

## 2012-06-22 SURGERY — CANCELLED PROCEDURE

## 2012-06-22 MED ORDER — DIPHENHYDRAMINE HCL 50 MG/ML IJ SOLN
INTRAMUSCULAR | Status: AC
  Filled 2012-06-22: qty 1

## 2012-06-22 MED ORDER — SODIUM CHLORIDE 0.9 % IV SOLN
250.0000 mL | INTRAVENOUS | Status: DC
  Administered 2012-06-22: 250 mL via INTRAVENOUS

## 2012-06-22 MED ORDER — SODIUM CHLORIDE 0.9 % IJ SOLN
3.0000 mL | Freq: Two times a day (BID) | INTRAMUSCULAR | Status: DC
  Administered 2012-06-23 – 2012-06-25 (×3): 3 mL via INTRAVENOUS

## 2012-06-22 MED ORDER — MIDAZOLAM HCL 5 MG/ML IJ SOLN
INTRAMUSCULAR | Status: AC
  Filled 2012-06-22: qty 2

## 2012-06-22 MED ORDER — SODIUM CHLORIDE 0.9 % IJ SOLN: 3.0000 mL | INTRAMUSCULAR | Status: DC | PRN

## 2012-06-22 MED ORDER — WARFARIN SODIUM 10 MG PO TABS
10.0000 mg | ORAL_TABLET | Freq: Once | ORAL | Status: AC
  Administered 2012-06-22: 10 mg via ORAL
  Filled 2012-06-22: qty 1

## 2012-06-22 MED ORDER — FENTANYL CITRATE 0.05 MG/ML IJ SOLN
INTRAMUSCULAR | Status: AC
  Filled 2012-06-22: qty 4

## 2012-06-22 NOTE — Progress Notes (Signed)
Patient presented to endo lab for TEE-guided DCCV.  INR is 1.7 and he is off heparin gtt.  It appears that his heparin bag completed and was never restarted.  He will go back to the floor.  Heparin will be restarted and he will have cardioversion tomorrow morning.   Marca Ancona 06/22/2012 3:36 PM

## 2012-06-22 NOTE — Progress Notes (Signed)
UR COMPLETED  

## 2012-06-22 NOTE — Progress Notes (Signed)
SUBJECTIVE:  Persistent cough with recent decompensated heart failure.    PHYSICAL EXAM Filed Vitals:   06/21/12 2132 06/22/12 0154 06/22/12 0550 06/22/12 0611  BP:  122/68 106/64 106/64  Pulse: 108 97  96  Temp: 98 F (36.7 C) 97.9 F (36.6 C)  97.7 F (36.5 C)  TempSrc: Oral Oral  Oral  Resp: 20 20  20   Height:      Weight:    270 lb 4.5 oz (122.6 kg)  SpO2: 96% 96%  96%   General:  No acute distress, coughing Lungs:  Decreased breath sounds at the bases  Heart:  Irregular Abdomen:  Positive bowel sounds, no rebound no guarding Extremities:  No edema.   LABS: Lab Results  Component Value Date   TROPONINI <0.30 06/22/2012   Results for orders placed during the hospital encounter of 06/21/12 (from the past 24 hour(s))  CBC WITH DIFFERENTIAL     Status: Abnormal   Collection Time    06/21/12  8:48 PM      Result Value Range   WBC 8.9  4.0 - 10.5 K/uL   RBC 4.57  4.22 - 5.81 MIL/uL   Hemoglobin 13.8  13.0 - 17.0 g/dL   HCT 78.2  95.6 - 21.3 %   MCV 89.5  78.0 - 100.0 fL   MCH 30.2  26.0 - 34.0 pg   MCHC 33.7  30.0 - 36.0 g/dL   RDW 08.6  57.8 - 46.9 %   Platelets 276  150 - 400 K/uL   Neutrophils Relative % 79 (*) 43 - 77 %   Neutro Abs 7.0  1.7 - 7.7 K/uL   Lymphocytes Relative 12  12 - 46 %   Lymphs Abs 1.1  0.7 - 4.0 K/uL   Monocytes Relative 8  3 - 12 %   Monocytes Absolute 0.7  0.1 - 1.0 K/uL   Eosinophils Relative 1  0 - 5 %   Eosinophils Absolute 0.1  0.0 - 0.7 K/uL   Basophils Relative 1  0 - 1 %   Basophils Absolute 0.0  0.0 - 0.1 K/uL  COMPREHENSIVE METABOLIC PANEL     Status: Abnormal   Collection Time    06/21/12  8:48 PM      Result Value Range   Sodium 136  135 - 145 mEq/L   Potassium 3.5  3.5 - 5.1 mEq/L   Chloride 99  96 - 112 mEq/L   CO2 27  19 - 32 mEq/L   Glucose, Bld 148 (*) 70 - 99 mg/dL   BUN 22  6 - 23 mg/dL   Creatinine, Ser 6.29 (*) 0.50 - 1.35 mg/dL   Calcium 9.5  8.4 - 52.8 mg/dL   Total Protein 6.8  6.0 - 8.3 g/dL   Albumin  3.5  3.5 - 5.2 g/dL   AST 18  0 - 37 U/L   ALT 17  0 - 53 U/L   Alkaline Phosphatase 93  39 - 117 U/L   Total Bilirubin 0.4  0.3 - 1.2 mg/dL   GFR calc non Af Amer 54 (*) >90 mL/min   GFR calc Af Amer 62 (*) >90 mL/min  PRO B NATRIURETIC PEPTIDE     Status: Abnormal   Collection Time    06/21/12  8:48 PM      Result Value Range   Pro B Natriuretic peptide (BNP) 2636.0 (*) 0 - 125 pg/mL  TSH     Status: None   Collection  Time    06/21/12  8:48 PM      Result Value Range   TSH 0.999  0.350 - 4.500 uIU/mL  TROPONIN I     Status: None   Collection Time    06/21/12  8:48 PM      Result Value Range   Troponin I <0.30  <0.30 ng/mL  PROTIME-INR     Status: Abnormal   Collection Time    06/21/12  8:48 PM      Result Value Range   Prothrombin Time 19.0 (*) 11.6 - 15.2 seconds   INR 1.65 (*) 0.00 - 1.49  TROPONIN I     Status: None   Collection Time    06/22/12  2:08 AM      Result Value Range   Troponin I <0.30  <0.30 ng/mL    Intake/Output Summary (Last 24 hours) at 06/22/12 0820 Last data filed at 06/22/12 0758  Gross per 24 hour  Intake    363 ml  Output   1050 ml  Net   -687 ml    ASSESSMENT AND PLAN:  ATRIAL FIBRILLATION:  On heparin.  Warfarin per pharmacy.  TEE/DCCV today.  I will have the device checked first however to make sure that he is in persistent atrial fib.   CHF:  Continue IV diuresis today and probably switch to PO in the AM.  DCCV as above.      [name] 06/22/2012 8:20 AM

## 2012-06-22 NOTE — Progress Notes (Signed)
TEE/Cardioversion canceled due to INR of 1.7. Patients heparin drip was discontinued in error by floor nurse ([id number]) before arriving on unit.

## 2012-06-22 NOTE — Progress Notes (Signed)
ANTICOAGULATION CONSULT NOTE - Follow-up   Pharmacy Consult for Heparin and Warfarin Indication: atrial fibrillation  Allergies  Allergen Reactions  . Losartan Cough  . Valsartan Cough   Patient Measurements: Height: 5\' 9"  (175.3 cm) Weight: 270 lb 4.5 oz (122.6 kg) IBW/kg (Calculated) : 70.7  Vital Signs: Temp: 97.8 F (36.6 C) (06/05 1420) Temp src: Oral (06/05 1420) BP: 142/103 mmHg (06/05 1530) Pulse Rate: 83 (06/05 1329)  Labs:  Recent Labs  06/21/12 2048 06/22/12 0208 06/22/12 0853 06/22/12 1600  HGB 13.8  --  14.5  --   HCT 40.9  --  42.9  --   PLT 276  --  288  --   APTT  --   --  130*  --   LABPROT 19.0*  --  19.4*  --   INR 1.65*  --  1.70*  --   HEPARINUNFRC  --   --  0.65 0.28*  CREATININE 1.48*  --  1.37*  --   TROPONINI <0.30 <0.30 <0.30  --    Estimated Creatinine Clearance: 84.4 ml/min (by C-G formula based on Cr of 1.37).    Assessment: 52yo male continues on IV heparin and coumadin for history of afib and CVA. Heparin level is slightly below goal.  Heparin was turned off from approximately 2pm to 4pm which explains why the latest heparin level is low.  Goal of Therapy:  INR 2-3 Heparin level 0.3-0.7 Monitor platelets by anticoagulation protocol: Yes   Plan:  1. Continue heparin gtt at 1600 units/hr 2. Check a 6 hr level to confirm 3. Coumadin 10mg  PO x 1 tonight as ordered 4. Continue daily heparin level, CBC and INR  Celedonio Miyamoto, PharmD, [facility] Clinical Pharmacist Pager [phone]   06/22/2012 4:50 PM

## 2012-06-22 NOTE — Progress Notes (Signed)
ANTICOAGULATION CONSULT NOTE - Follow-up   Pharmacy Consult for Heparin and Warfarin Indication: atrial fibrillation  Allergies  Allergen Reactions  . Losartan Cough  . Valsartan Cough   Patient Measurements: Height: 5\' 9"  (175.3 cm) Weight: 270 lb 4.5 oz (122.6 kg) IBW/kg (Calculated) : 70.7  Vital Signs: Temp: 97.5 F (36.4 C) (06/05 0900) Temp src: Oral (06/05 0900) BP: 110/58 mmHg (06/05 0900) Pulse Rate: 96 (06/05 0900)  Labs:  Recent Labs  06/21/12 2048 06/22/12 0208 06/22/12 0853  HGB 13.8  --  14.5  HCT 40.9  --  42.9  PLT 276  --  288  APTT  --   --  130*  LABPROT 19.0*  --  19.4*  INR 1.65*  --  1.70*  HEPARINUNFRC  --   --  0.65  CREATININE 1.48*  --  1.37*  TROPONINI <0.30 <0.30 <0.30   Estimated Creatinine Clearance: 84.4 ml/min (by C-G formula based on Cr of 1.37).    Assessment: 52yo male continues on IV heparin and coumadin for history of afib and CVA. Heparin level is at goal at 0.65, and INR remains subtherapeutic at 1.7. CBC is WNL. No bleeding noted. Planning on TEE/DCCV today per cardiology.   Goal of Therapy:  INR 2-3 Heparin level 0.3-0.7 Monitor platelets by anticoagulation protocol: Yes   Plan:  1. Continue heparin gtt at 1600 units/hr 2. Check a 6 hr level to confirm 3. Coumadin 10mg  PO x 1 tonight 4. Continue daily heparin level, CBC and INR  Lysle Pearl, PharmD, BCPS Pager # [phone] 06/22/2012 11:20 AM

## 2012-06-23 ENCOUNTER — Encounter (HOSPITAL_COMMUNITY): Payer: Self-pay | Admitting: *Deleted

## 2012-06-23 ENCOUNTER — Encounter (HOSPITAL_COMMUNITY)
Admission: AD | Disposition: A | Discharge status: Home Health | Payer: Self-pay | Source: Ambulatory Visit | Attending: Cardiology

## 2012-06-23 ENCOUNTER — Inpatient Hospital Stay (HOSPITAL_COMMUNITY): Payer: Medicare Other | Admitting: Certified Registered Nurse Anesthetist

## 2012-06-23 ENCOUNTER — Encounter (HOSPITAL_COMMUNITY): Payer: Self-pay | Admitting: Certified Registered Nurse Anesthetist

## 2012-06-23 DIAGNOSIS — R079 Chest pain, unspecified: Secondary | ICD-10-CM

## 2012-06-23 DIAGNOSIS — I4891 Unspecified atrial fibrillation: Secondary | ICD-10-CM

## 2012-06-23 HISTORY — PX: TEE WITHOUT CARDIOVERSION: SHX5443

## 2012-06-23 LAB — PROTIME-INR
INR: 2.44 — ABNORMAL HIGH (ref 0.00–1.49)
Prothrombin Time: 25.4 seconds — ABNORMAL HIGH (ref 11.6–15.2)

## 2012-06-23 LAB — BASIC METABOLIC PANEL
Chloride: 97 mEq/L (ref 96–112)
GFR calc Af Amer: 66 mL/min — ABNORMAL LOW (ref >90)
GFR calc non Af Amer: 57 mL/min — ABNORMAL LOW (ref >90)
Potassium: 3.8 mEq/L (ref 3.5–5.1)
Sodium: 136 mEq/L (ref 135–145)

## 2012-06-23 LAB — CBC
MCHC: 32.5 g/dL (ref 30.0–36.0)
MCV: 90.8 fL (ref 78.0–100.0)
Platelets: 242 10*3/uL (ref 150–400)
RDW: 14.1 % (ref 11.5–15.5)
WBC: 6.7 10*3/uL (ref 4.0–10.5)

## 2012-06-23 SURGERY — ECHOCARDIOGRAM, TRANSESOPHAGEAL
Anesthesia: Moderate Sedation

## 2012-06-23 MED ORDER — FENTANYL CITRATE 0.05 MG/ML IJ SOLN
INTRAMUSCULAR | Status: AC
  Filled 2012-06-23: qty 4

## 2012-06-23 MED ORDER — SODIUM CHLORIDE 0.9 % IV SOLN
INTRAVENOUS | Status: DC
  Administered 2012-06-23: 08:00:00 via INTRAVENOUS

## 2012-06-23 MED ORDER — WARFARIN SODIUM 7.5 MG PO TABS
7.5000 mg | ORAL_TABLET | Freq: Once | ORAL | Status: AC
  Administered 2012-06-23: 7.5 mg via ORAL
  Filled 2012-06-23: qty 1

## 2012-06-23 MED ORDER — FENTANYL CITRATE 0.05 MG/ML IJ SOLN
INTRAMUSCULAR | Status: DC | PRN
  Administered 2012-06-23: 50 ug via INTRAVENOUS

## 2012-06-23 MED ORDER — MIDAZOLAM HCL 10 MG/2ML IJ SOLN
INTRAMUSCULAR | Status: DC | PRN
  Administered 2012-06-23: 3 mg via INTRAVENOUS

## 2012-06-23 MED ORDER — BUTAMBEN-TETRACAINE-BENZOCAINE 2-2-14 % EX AERO
INHALATION_SPRAY | CUTANEOUS | Status: DC | PRN
  Administered 2012-06-23: 1 via TOPICAL

## 2012-06-23 MED ORDER — MIDAZOLAM HCL 5 MG/ML IJ SOLN
INTRAMUSCULAR | Status: AC
  Filled 2012-06-23: qty 3

## 2012-06-23 MED ORDER — DIPHENHYDRAMINE HCL 50 MG/ML IJ SOLN
INTRAMUSCULAR | Status: AC
  Filled 2012-06-23: qty 1

## 2012-06-23 NOTE — Progress Notes (Signed)
RN reinforced importance of urinal to accurately measure I&O's. Patient resting comfortably in no acute distress. RN will continue to monitor. Louretta Parma, RN

## 2012-06-23 NOTE — Progress Notes (Signed)
ANTICOAGULATION CONSULT NOTE - Follow-up   Pharmacy Consult for warfarin Indication: atrial fibrillation  Allergies  Allergen Reactions  . Losartan Cough  . Valsartan Cough   Patient Measurements: Height: 5\' 9"  (175.3 cm) Weight: 274 lb 4.8 oz (124.422 kg) (scale b) IBW/kg (Calculated) : 70.7  Vital Signs: Temp: 98.1 F (36.7 C) (06/06 0717) Temp src: Oral (06/06 0717) BP: 105/78 mmHg (06/06 0930) Pulse Rate: 86 (06/06 0930)  Labs:  Recent Labs  06/21/12 2048 06/22/12 0208  06/22/12 0853 06/22/12 1600 06/22/12 2346 06/23/12 0405 06/23/12 1214  HGB 13.8  --   --  14.5  --   --   --  13.5  HCT 40.9  --   --  42.9  --   --   --  41.6  PLT 276  --   --  288  --   --   --  242  APTT  --   --   --  130*  --   --   --   --   LABPROT 19.0*  --   --  19.4*  --   --   --  25.4*  INR 1.65*  --   --  1.70*  --   --   --  2.44*  HEPARINUNFRC  --   --   < > 0.65 0.28* 0.69  --  0.20*  CREATININE 1.48*  --   --  1.37*  --   --  1.41*  --   TROPONINI <0.30 <0.30  --  <0.30  --   --   --   --   < > = values in this interval not displayed. Estimated Creatinine Clearance: 82.6 ml/min (by C-G formula based on Cr of 1.41).    Assessment: 52yo male continues on IV heparin and coumadin for history of afib and CVA. Heparin level is low at 0.2. INR is now therapeutic at 2.44. CBC is stable. No bleeding noted. S/p TEE today which showed a possible LAA thrombus so DCCV was not completed.   Goal of Therapy:  INR 2-3   Plan:  1. DC heparin gtt since INR is therapeutic per discussion with MD 2. Coumadin 7.5mg  PO x 1 tonight 3. F/u AM INR  Lysle Pearl, PharmD, BCPS Pager # [phone] 06/23/2012 1:29 PM

## 2012-06-23 NOTE — Progress Notes (Signed)
PROGRESS NOTE  Subjective:   Pt has severe CHF. Presented with persistent cough.  Admitted for TEE /CV.  Still coughing.  Objective:    Vital Signs:   Temp:  [97.1 F (36.2 C)-98.1 F (36.7 C)] 98.1 F (36.7 C) (06/06 0717) Pulse Rate:  [83-109] 83 (06/06 0500) Resp:  [12-43] 27 (06/06 0717) BP: (100-148)/(58-103) 148/84 mmHg (06/06 0717) SpO2:  [92 %-100 %] 97 % (06/06 0717) Weight:  [274 lb 4.8 oz (124.422 kg)] 274 lb 4.8 oz (124.422 kg) (06/06 0446)  Last BM Date: 06/21/12   24-hour weight change: Weight change: 2 lb 1.6 oz (0.953 kg)  Weight trends: Filed Weights   06/21/12 1700 06/22/12 0611 06/23/12 0446  Weight: 272 lb 3.2 oz (123.469 kg) 270 lb 4.5 oz (122.6 kg) 274 lb 4.8 oz (124.422 kg)    Intake/Output:  06/05 0701 - 06/06 0700 In: 1121.3 [P.O.:682; I.V.:439.3] Out: 1800 [Urine:1800]     Physical Exam: BP 148/84  Pulse 83  Temp(Src) 98.1 F (36.7 C) (Oral)  Resp 27  Ht 5\' 9"  (1.753 m)  Wt 274 lb 4.8 oz (124.422 kg)  BMI 40.49 kg/m2  SpO2 97%  General: Vital signs reviewed and noted.   Head: Normocephalic, atraumatic.  Eyes: conjunctivae/corneas clear.  EOM's intact.   Throat: normal  Neck:  normal  Lungs:    few rhonchi  Heart:  Irreg. Irreg.  Abdomen:  Soft, non-tender, non-distended    Extremities: No edema   Neurologic: A&O X3, CN II - XII are grossly intact.   Psych: Normal     Labs: BMET:  Recent Labs  06/22/12 0853 06/23/12 0405  NA 138 136  K 3.1* 3.8  CL 99 97  CO2 26 29  GLUCOSE 124* 95  BUN 22 22  CREATININE 1.37* 1.41*  CALCIUM 9.1 9.0    Liver function tests:  Recent Labs  06/21/12 2048  AST 18  ALT 17  ALKPHOS 93  BILITOT 0.4  PROT 6.8  ALBUMIN 3.5   No results found for this basename: LIPASE, AMYLASE,  in the last 72 hours  CBC:  Recent Labs  06/21/12 2048 06/22/12 0853  WBC 8.9 8.2  NEUTROABS 7.0  --   HGB 13.8 14.5  HCT 40.9 42.9  MCV 89.5 89.7  PLT 276 288    Cardiac  Enzymes:  Recent Labs  06/21/12 2048 06/22/12 0208 06/22/12 0853  TROPONINI <0.30 <0.30 <0.30    Coagulation Studies:  Recent Labs  06/21/12 2048 06/22/12 0853  LABPROT 19.0* 19.4*  INR 1.65* 1.70*    Other: No components found with this basename: POCBNP,  No results found for this basename: DDIMER,  in the last 72 hours No results found for this basename: HGBA1C,  in the last 72 hours No results found for this basename: CHOL, HDL, LDLCALC, TRIG, CHOLHDL,  in the last 72 hours  Recent Labs  06/21/12 2048  TSH 0.999   No results found for this basename: VITAMINB12, FOLATE, FERRITIN, TIBC, IRON, RETICCTPCT,  in the last 72 hours   Other results:  Tele:  A-Fib.  Medications:    Infusions: . sodium chloride 250 mL (06/22/12 1601)  . heparin 1,500 Units/hr (06/23/12 0047)    Scheduled Medications: . [facility] HOLD] acyclovir  800 mg Oral Daily  . [facility] HOLD] amiodarone  200 mg Oral BID  . [facility] HOLD] digoxin  0.125 mg Oral Daily  . [facility] HOLD] furosemide  80 mg Intravenous BID  . [facility] HOLD] levothyroxine  400 mcg Oral QAC breakfast  . [[facility] HOLD] losartan  50 mg Oral Daily  . [facility] HOLD] metoprolol  25 mg Oral BID  . [facility] HOLD] potassium chloride  20 mEq Oral Daily  . [[facility] HOLD] sodium chloride  3 mL Intravenous Q12H  . [[facility] HOLD] sodium chloride  3 mL Intravenous Q12H  . [facility] HOLD] spironolactone  25 mg Oral Daily  . [facility] HOLD] Warfarin - Pharmacist Dosing Inpatient   Does not apply q1800    Assessment/ Plan:    1. CHF:  Thought to be exacerbated by rapid AF.  Will proceed with TEE this am.  Unfortunately, the CV is not scheduled until 11 am.  2. Cough:  He still has a cough.  He may have some pulmonary issues that are contributing to his cough.  3. CVA:    4. HTN:  5. Hypothyroidsim:   Disposition: for tee / CV today. Length of Stay: 2  Vesta Mixer, Montez Hageman., MD, [facility] 06/23/2012, 7:32 AM Office [phone] Pager [phone]

## 2012-06-23 NOTE — Interval H&P Note (Signed)
History and Physical Interval Note:  06/23/2012 7:40 AM  Raymond [name]  has presented today for surgery, with the diagnosis of afib  The various methods of treatment have been discussed with the patient and family. After consideration of risks, benefits and other options for treatment, the patient has consented to  Procedure(s) with comments: TRANSESOPHAGEAL ECHOCARDIOGRAM (TEE) (N/A) - TEE will be done at 0730 Cardioversion is scheduled at 1100-1200 Mary/ebp CARDIOVERSION (N/A) - will be done 1100-1200 mary/ebp as a surgical intervention .  The patient's history has been reviewed, patient examined, no change in status, stable for surgery.  I have reviewed the patient's chart and labs.  Questions were answered to the patient's satisfaction.     Elyn Aquas.

## 2012-06-23 NOTE — Progress Notes (Signed)
[facility] status.  Patient has not seen his listed PCP, Dow Adolph, since June 2013.  Dr Audria Nine is willing to reconnect with the patient for PCP support at discharge.  Notified Ivonne Andrew RNCM of PCP status.  Unable to evaluate patient for [facility] services at this time because he is off of the floor for a procedure.  Of note, [facility] Care Management services does not replace or interfere with any services that are arranged by inpatient case management or social work.  For additional questions or referrals please contact Anibal Henderson BSN RN [facility] Liaison at [phone].

## 2012-06-23 NOTE — Care Management Note (Signed)
    Page 1 of 1   06/23/2012     3:55:01 PM   CARE MANAGEMENT NOTE 06/23/2012  Patient:  Raymond [name],Raymond [name]   Account Number:  [id number]  Date Initiated:  06/23/2012  Documentation initiated by:  Tera Mater  Subjective/Objective Assessment:   52yo male admitted with AFIB and CHF.  Pt. lives with spouse in [place].     Action/Plan:   progression of pt. and discharge planning   Anticipated DC Date:  06/24/2012   Anticipated DC Plan:  HOME W HOME HEALTH SERVICES      DC Planning Services  CM consult      Choice offered to / List presented to:  C-1 Patient        HH arranged  HH-1 RN  HH-10 DISEASE MANAGEMENT      HH agency  [facility]   Status of service:  In process, will continue to follow Medicare Important Message given?   (If response is "NO", the following Medicare IM given date fields will be blank) Date Medicare IM given:   Date Additional Medicare IM given:    Discharge Disposition:    Per UR Regulation:  Reviewed for med. necessity/level of care/duration of stay  If discussed at Long Length of Stay Meetings, dates discussed:    Comments:  06/23/12 1545 In to complete Heart Failure home health screen.  Pt. is interested in having a HH RN for heart failure management.  Pt. states he has had [facility] in past and was satisfied with their services.  TC to Lupita Leash, with [facility] to give referral.  Pt. may dc today or as early as tomorrow. Tera Mater, RN, BSN NCM [phone]

## 2012-06-23 NOTE — H&P (View-Only) (Signed)
 PROGRESS NOTE  Subjective:   Pt has severe CHF. Presented with persistent cough.  Admitted for TEE /CV.  Still coughing.  Objective:    Vital Signs:   Temp:  [97.1 F (36.2 C)-98.1 F (36.7 C)] 98.1 F (36.7 C) (06/06 0717) Pulse Rate:  [83-109] 83 (06/06 0500) Resp:  [12-43] 27 (06/06 0717) BP: (100-148)/(58-103) 148/84 mmHg (06/06 0717) SpO2:  [92 %-100 %] 97 % (06/06 0717) Weight:  [274 lb 4.8 oz (124.422 kg)] 274 lb 4.8 oz (124.422 kg) (06/06 0446)  Last BM Date: 06/21/12   24-hour weight change: Weight change: 2 lb 1.6 oz (0.953 kg)  Weight trends: Filed Weights   06/21/12 1700 06/22/12 0611 06/23/12 0446  Weight: 272 lb 3.2 oz (123.469 kg) 270 lb 4.5 oz (122.6 kg) 274 lb 4.8 oz (124.422 kg)    Intake/Output:  06/05 0701 - 06/06 0700 In: 1121.3 [P.O.:682; I.V.:439.3] Out: 1800 [Urine:1800]     Physical Exam: BP 148/84  Pulse 83  Temp(Src) 98.1 F (36.7 C) (Oral)  Resp 27  Ht 5' 9" (1.753 m)  Wt 274 lb 4.8 oz (124.422 kg)  BMI 40.49 kg/m2  SpO2 97%  General: Vital signs reviewed and noted.   Head: Normocephalic, atraumatic.  Eyes: conjunctivae/corneas clear.  EOM's intact.   Throat: normal  Neck:  normal  Lungs:    few rhonchi  Heart:  Irreg. Irreg.  Abdomen:  Soft, non-tender, non-distended    Extremities: No edema   Neurologic: A&O X3, CN II - XII are grossly intact.   Psych: Normal     Labs: BMET:  Recent Labs  06/22/12 0853 06/23/12 0405  NA 138 136  K 3.1* 3.8  CL 99 97  CO2 26 29  GLUCOSE 124* 95  BUN 22 22  CREATININE 1.37* 1.41*  CALCIUM 9.1 9.0    Liver function tests:  Recent Labs  06/21/12 2048  AST 18  ALT 17  ALKPHOS 93  BILITOT 0.4  PROT 6.8  ALBUMIN 3.5   No results found for this basename: LIPASE, AMYLASE,  in the last 72 hours  CBC:  Recent Labs  06/21/12 2048 06/22/12 0853  WBC 8.9 8.2  NEUTROABS 7.0  --   HGB 13.8 14.5  HCT 40.9 42.9  MCV 89.5 89.7  PLT 276 288    Cardiac  Enzymes:  Recent Labs  06/21/12 2048 06/22/12 0208 06/22/12 0853  TROPONINI <0.30 <0.30 <0.30    Coagulation Studies:  Recent Labs  06/21/12 2048 06/22/12 0853  LABPROT 19.0* 19.4*  INR 1.65* 1.70*    Other: No components found with this basename: POCBNP,  No results found for this basename: DDIMER,  in the last 72 hours No results found for this basename: HGBA1C,  in the last 72 hours No results found for this basename: CHOL, HDL, LDLCALC, TRIG, CHOLHDL,  in the last 72 hours  Recent Labs  06/21/12 2048  TSH 0.999   No results found for this basename: VITAMINB12, FOLATE, FERRITIN, TIBC, IRON, RETICCTPCT,  in the last 72 hours   Other results:  Tele:  A-Fib.  Medications:    Infusions: . sodium chloride 250 mL (06/22/12 1601)  . heparin 1,500 Units/hr (06/23/12 0047)    Scheduled Medications: . [[facility] HOLD] acyclovir  800 mg Oral Daily  . [[facility] HOLD] amiodarone  200 mg Oral BID  . [[facility] HOLD] digoxin  0.125 mg Oral Daily  . [[facility] HOLD] furosemide  80 mg Intravenous BID  . [[facility] HOLD] levothyroxine    400 mcg Oral QAC breakfast  . [[facility] HOLD] losartan  50 mg Oral Daily  . [[facility] HOLD] metoprolol  25 mg Oral BID  . [[facility] HOLD] potassium chloride  20 mEq Oral Daily  . [[facility] HOLD] sodium chloride  3 mL Intravenous Q12H  . [[facility] HOLD] sodium chloride  3 mL Intravenous Q12H  . [[facility] HOLD] spironolactone  25 mg Oral Daily  . [[facility] HOLD] Warfarin - Pharmacist Dosing Inpatient   Does not apply q1800    Assessment/ Plan:    1. CHF:  Thought to be exacerbated by rapid AF.  Will proceed with TEE this am.  Unfortunately, the CV is not scheduled until 11 am.  2. Cough:  He still has a cough.  He may have some pulmonary issues that are contributing to his cough.  3. CVA:    4. HTN:  5. Hypothyroidsim:   Disposition: for tee / CV today. Length of Stay: 2  Philip J. Nahser, Jr., MD, FACC 06/23/2012, 7:32 AM Office [phone] Pager [phone]    

## 2012-06-23 NOTE — CV Procedure (Signed)
    Transesophageal Echocardiogram Note  Raymond [name] [id number] 1960/10/05  Procedure: Transesophageal Echocardiogram Indications: atrial fib  Procedure Details Consent: Obtained Time Out: Verified patient identification, verified procedure, site/side was marked, verified correct patient position, special equipment/implants available, Radiology Safety Procedures followed,  medications/allergies/relevent history reviewed, required imaging and test results available.  Performed  Medications: Fentanyl: 50 mcg IV Versed: 3 mg IV  Left Ventrical:  Markedly depressed LV function.  EF 10%  Mitral Valve: mild MR  Aortic Valve: normal AV  Tricuspid Valve: mild - mod TR  Pulmonic Valve: no siginificant PI  Left Atrium/ Left atrial appendage: there is extensive spontaneous contrast in the LA and LAA.  There is a question of a mass in the LAA that could be a thrombus.  It is associated with extensive spontaneous contrast "smoke"  Atrial septum: grossly normal , bubble study was not done  Aorta: normal   Complications: No apparent complications Patient did tolerate procedure well.  Cardioversion was not done because of this question of thrombus  Alvia Grove., MD, [facility] 06/23/2012, 8:00 AM

## 2012-06-23 NOTE — Transfer of Care (Signed)
Case cancelled

## 2012-06-23 NOTE — Progress Notes (Signed)
ANTICOAGULATION CONSULT NOTE - Follow Up Consult  Pharmacy Consult for heparin Indication: atrial fibrillation  Labs:  Recent Labs  06/21/12 2048 06/22/12 0208 06/22/12 0853 06/22/12 1600 06/22/12 2346  HGB 13.8  --  14.5  --   --   HCT 40.9  --  42.9  --   --   PLT 276  --  288  --   --   APTT  --   --  130*  --   --   LABPROT 19.0*  --  19.4*  --   --   INR 1.65*  --  1.70*  --   --   HEPARINUNFRC  --   --  0.65 0.28* 0.69  CREATININE 1.48*  --  1.37*  --   --   TROPONINI <0.30 <0.30 <0.30  --   --     Assessment: 52yo male remains therapeutic though trending up at very high end of goal.  Goal of Therapy:  Heparin level 0.3-0.7 units/ml   Plan:  Will decrease heparin gtt slightly to 1500 units/hr and check level in 6hr.  Vernard Gambles, PharmD, BCPS  06/23/2012,12:47 AM

## 2012-06-23 NOTE — Anesthesia Preprocedure Evaluation (Signed)
Anesthesia Evaluation    Airway       Dental   Pulmonary shortness of breath,          Cardiovascular hypertension, Pt. on medications and Pt. on home beta blockers +CHF + dysrhythmias Atrial Fibrillation     Neuro/Psych Anxiety Depression CVA    GI/Hepatic   Endo/Other  Hypothyroidism Morbid obesity  Renal/GU      Musculoskeletal   Abdominal   Peds  Hematology   Anesthesia Other Findings   Reproductive/Obstetrics                           Anesthesia Physical Anesthesia Plan  ASA: III  Anesthesia Plan: General   Post-op Pain Management:    Induction: Intravenous  Airway Management Planned: Mask  Additional Equipment:   Intra-op Plan:   Post-operative Plan:   Informed Consent: I have reviewed the patients History and Physical, chart, labs and discussed the procedure including the risks, benefits and alternatives for the proposed anesthesia with the patient or authorized representative who has indicated his/her understanding and acceptance.     Plan Discussed with: CRNA, Surgeon and Anesthesiologist  Anesthesia Plan Comments:         Anesthesia Quick Evaluation

## 2012-06-23 NOTE — Anesthesia Postprocedure Evaluation (Signed)
Case cancelled

## 2012-06-23 NOTE — Preoperative (Signed)
Beta Blockers   Reason not to administer Beta Blockers:Not Applicable, Pt took beta blockers this am

## 2012-06-23 NOTE — Progress Notes (Signed)
  Echocardiogram Echocardiogram Transesophageal has been performed.  Georgian Co 06/23/2012, 9:11 AM

## 2012-06-23 NOTE — Plan of Care (Signed)
Problem: Phase II Progression Outcomes Goal: Pain controlled Outcome: Completed/Met Date Met:  06/23/12 Pt not c/o any pain Goal: Tolerating diet Outcome: Completed/Met Date Met:  06/23/12 Pt tolerating diet, no c/o n/v

## 2012-06-23 NOTE — Progress Notes (Addendum)
SUBJECTIVE:  Persistent cough with recent decompensated heart failure.  TEE/Cardioversion was postponed yesterday because he was not on heparin at the time of   PHYSICAL EXAM Filed Vitals:   06/22/12 2118 06/23/12 0446 06/23/12 0500 06/23/12 0717  BP: 100/80 119/72  148/84  Pulse: 109 89 83   Temp: 97.4 F (36.3 C) 97.3 F (36.3 C) 97.3 F (36.3 C) 98.1 F (36.7 C)  TempSrc: Oral Oral Oral Oral  Resp: 20 18 18 27   Height:      Weight:  274 lb 4.8 oz (124.422 kg)    SpO2: 96% 100%  97%   General:  No acute distress, coughing Lungs:  Decreased breath sounds at the bases  Heart:  Irregular Abdomen:  Positive bowel sounds, no rebound no guarding Extremities:  No edema.   LABS: Lab Results  Component Value Date   TROPONINI <0.30 06/22/2012   Results for orders placed during the hospital encounter of 06/21/12 (from the past 24 hour(s))  BASIC METABOLIC PANEL     Status: Abnormal   Collection Time    06/22/12  8:53 AM      Result Value Range   Sodium 138  135 - 145 mEq/L   Potassium 3.1 (*) 3.5 - 5.1 mEq/L   Chloride 99  96 - 112 mEq/L   CO2 26  19 - 32 mEq/L   Glucose, Bld 124 (*) 70 - 99 mg/dL   BUN 22  6 - 23 mg/dL   Creatinine, Ser 4.01 (*) 0.50 - 1.35 mg/dL   Calcium 9.1  8.4 - 02.7 mg/dL   GFR calc non Af Amer 59 (*) >90 mL/min   GFR calc Af Amer 69 (*) >90 mL/min  DIGOXIN LEVEL     Status: Abnormal   Collection Time    06/22/12  8:53 AM      Result Value Range   Digoxin Level 0.5 (*) 0.8 - 2.0 ng/mL  HEPARIN LEVEL (UNFRACTIONATED)     Status: None   Collection Time    06/22/12  8:53 AM      Result Value Range   Heparin Unfractionated 0.65  0.30 - 0.70 IU/mL  PROTIME-INR     Status: Abnormal   Collection Time    06/22/12  8:53 AM      Result Value Range   Prothrombin Time 19.4 (*) 11.6 - 15.2 seconds   INR 1.70 (*) 0.00 - 1.49  TROPONIN I     Status: None   Collection Time    06/22/12  8:53 AM      Result Value Range   Troponin I <0.30  <0.30 ng/mL    CBC     Status: None   Collection Time    06/22/12  8:53 AM      Result Value Range   WBC 8.2  4.0 - 10.5 K/uL   RBC 4.78  4.22 - 5.81 MIL/uL   Hemoglobin 14.5  13.0 - 17.0 g/dL   HCT 25.3  66.4 - 40.3 %   MCV 89.7  78.0 - 100.0 fL   MCH 30.3  26.0 - 34.0 pg   MCHC 33.8  30.0 - 36.0 g/dL   RDW 47.4  25.9 - 56.3 %   Platelets 288  150 - 400 K/uL  APTT     Status: Abnormal   Collection Time    06/22/12  8:53 AM      Result Value Range   aPTT 130 (*) 24 - 37 seconds  HEPARIN  LEVEL (UNFRACTIONATED)     Status: Abnormal   Collection Time    06/22/12  4:00 PM      Result Value Range   Heparin Unfractionated 0.28 (*) 0.30 - 0.70 IU/mL  HEPARIN LEVEL (UNFRACTIONATED)     Status: None   Collection Time    06/22/12 11:46 PM      Result Value Range   Heparin Unfractionated 0.69  0.30 - 0.70 IU/mL  BASIC METABOLIC PANEL     Status: Abnormal   Collection Time    06/23/12  4:05 AM      Result Value Range   Sodium 136  135 - 145 mEq/L   Potassium 3.8  3.5 - 5.1 mEq/L   Chloride 97  96 - 112 mEq/L   CO2 29  19 - 32 mEq/L   Glucose, Bld 95  70 - 99 mg/dL   BUN 22  6 - 23 mg/dL   Creatinine, Ser 1.61 (*) 0.50 - 1.35 mg/dL   Calcium 9.0  8.4 - 09.6 mg/dL   GFR calc non Af Amer 57 (*) >90 mL/min   GFR calc Af Amer 66 (*) >90 mL/min    Intake/Output Summary (Last 24 hours) at 06/23/12 0724 Last data filed at 06/23/12 0600  Gross per 24 hour  Intake 1121.31 ml  Output   1800 ml  Net -678.69 ml    ASSESSMENT AND PLAN:  ATRIAL FIBRILLATION:  On heparin.  Warfarin per pharmacy.  Pt had a TEE today.  There was a suspicious mass in the LAA associated with extensive spontaneous contrast ("smoke").  I could not definitely rule out thrombus.  The cardioversion was not done.    He will need 1 month of therapeutic anticoagulation prior to his next cardioversion.  Coumadin per pharmacy protocol.   CHF:  Continue IV diuresis today.  He continues to diurese well.  He is still very short of  breath and has a tight cough.      Elyn Aquas. 06/23/2012 7:24 AM

## 2012-06-24 LAB — CBC
HCT: 43.9 % (ref 39.0–52.0)
Hemoglobin: 14.2 g/dL (ref 13.0–17.0)
MCH: 29.6 pg (ref 26.0–34.0)
RBC: 4.8 MIL/uL (ref 4.22–5.81)

## 2012-06-24 LAB — PROTIME-INR
INR: 2.77 — ABNORMAL HIGH (ref 0.00–1.49)
Prothrombin Time: 27.9 seconds — ABNORMAL HIGH (ref 11.6–15.2)

## 2012-06-24 LAB — BASIC METABOLIC PANEL
BUN: 22 mg/dL (ref 6–23)
CO2: 29 mEq/L (ref 19–32)
GFR calc Af Amer: 66 mL/min — ABNORMAL LOW (ref >90)
GFR calc non Af Amer: 57 mL/min — ABNORMAL LOW (ref >90)

## 2012-06-24 MED ORDER — WARFARIN SODIUM 5 MG PO TABS
5.0000 mg | ORAL_TABLET | Freq: Once | ORAL | Status: AC
  Administered 2012-06-24: 5 mg via ORAL
  Filled 2012-06-24: qty 1

## 2012-06-24 MED ORDER — FUROSEMIDE 80 MG PO TABS
80.0000 mg | ORAL_TABLET | Freq: Two times a day (BID) | ORAL | Status: DC
  Administered 2012-06-24 – 2012-06-25 (×2): 80 mg via ORAL
  Filled 2012-06-24 (×4): qty 1

## 2012-06-24 MED ORDER — METOLAZONE 2.5 MG PO TABS
2.5000 mg | ORAL_TABLET | Freq: Every day | ORAL | Status: DC
  Administered 2012-06-24 – 2012-06-25 (×2): 2.5 mg via ORAL
  Filled 2012-06-24 (×2): qty 1

## 2012-06-24 NOTE — Progress Notes (Signed)
ANTICOAGULATION CONSULT NOTE - Follow Up Consult  Pharmacy Consult for warfarin Indication: atrial fibrillation  Allergies  Allergen Reactions  . Losartan Cough  . Valsartan Cough    Patient Measurements: Height: 5\' 9"  (175.3 cm) Weight: 277 lb (125.646 kg) (scale b) IBW/kg (Calculated) : 70.7  Vital Signs: Temp: 97.3 F (36.3 C) (06/07 0508) Temp src: Oral (06/07 0508) BP: 113/86 mmHg (06/07 0508) Pulse Rate: 76 (06/07 0508)  Labs:  Recent Labs  06/21/12 2048 06/22/12 0208  06/22/12 0853 06/22/12 1600 06/22/12 2346 06/23/12 0405 06/23/12 1214 06/24/12 0515  HGB 13.8  --   --  14.5  --   --   --  13.5 14.2  HCT 40.9  --   --  42.9  --   --   --  41.6 43.9  PLT 276  --   --  288  --   --   --  242 273  APTT  --   --   --  130*  --   --   --   --   --   LABPROT 19.0*  --   --  19.4*  --   --   --  25.4* 27.9*  INR 1.65*  --   --  1.70*  --   --   --  2.44* 2.77*  HEPARINUNFRC  --   --   < > 0.65 0.28* 0.69  --  0.20*  --   CREATININE 1.48*  --   --  1.37*  --   --  1.41*  --  1.41*  TROPONINI <0.30 <0.30  --  <0.30  --   --   --   --   --   < > = values in this interval not displayed.  Estimated Creatinine Clearance: 83.1 ml/min (by C-G formula based on Cr of 1.41).   Medications:  Scheduled:  . acyclovir  800 mg Oral Daily  . amiodarone  200 mg Oral BID  . digoxin  0.125 mg Oral Daily  . furosemide  80 mg Intravenous BID  . levothyroxine  400 mcg Oral QAC breakfast  . losartan  50 mg Oral Daily  . metoprolol  25 mg Oral BID  . potassium chloride  20 mEq Oral Daily  . sodium chloride  3 mL Intravenous Q12H  . sodium chloride  3 mL Intravenous Q12H  . spironolactone  25 mg Oral Daily  . Warfarin - Pharmacist Dosing Inpatient   Does not apply q1800    Assessment: 52yo male continues on coumadin for history of afib and CVA. Heparin discontinued yesterday since INR therapeutic. Today's INR is 2.77. CBC is stable. No bleeding noted. S/p TEE yesterday which  showed a possible LAA thrombus so DCCV was not completed.   Goal of Therapy:  INR 2-3  Plan:  1. Coumadin 5mg  PO x 1 tonight  2. F/u AM INR  Lillia Pauls, PharmD Clinical Pharmacist Pager: [phone] Phone: [phone] 06/24/2012 8:54 AM

## 2012-06-24 NOTE — Progress Notes (Signed)
Patient ID: MCDONALD REILING, male   DOB: [birth year], 52 y.o.   MRN: [id number] Subjective:  " I feel better, I want to go home"  Objective:  Vital Signs in the last 24 hours: Temp:  [97.3 F (36.3 C)-97.5 F (36.4 C)] 97.3 F (36.3 C) (06/07 0508) Pulse Rate:  [76-97] 76 (06/07 0508) Resp:  [18-20] 18 (06/07 0508) BP: (107-114)/(60-86) 114/60 mmHg (06/07 1059) SpO2:  [95 %-98 %] 96 % (06/07 0508) Weight:  [277 lb (125.646 kg)] 277 lb (125.646 kg) (06/07 0508)  Intake/Output from previous day: 06/06 0701 - 06/07 0700 In: 1680 [P.O.:1680] Out: 175 [Urine:175] Intake/Output from this shift: Total I/O In: 440 [P.O.:440] Out: -   Physical Exam: obese appearing NAD HEENT: Unremarkable Neck:  7 cm JVD, no thyromegally Lungs:  Clear with no wheezes HEART:  Regular rate rhythm, no murmurs, no rubs, no clicks Abd:  soft, obese, positive bowel sounds, no organomegally, no rebound, no guarding Ext:  2 plus pulses, no edema, no cyanosis, no clubbing Skin:  No rashes no nodules Neuro:  CN II through XII intact, motor grossly intact  Lab Results:  Recent Labs  06/23/12 1214 06/24/12 0515  WBC 6.7 7.8  HGB 13.5 14.2  PLT 242 273    Recent Labs  06/23/12 0405 06/24/12 0515  NA 136 135  K 3.8 3.6  CL 97 94*  CO2 29 29  GLUCOSE 95 109*  BUN 22 22  CREATININE 1.41* 1.41*    Recent Labs  06/22/12 0208 06/22/12 0853  TROPONINI <0.30 <0.30   Hepatic Function Panel  Recent Labs  06/21/12 2048  PROT 6.8  ALBUMIN 3.5  AST 18  ALT 17  ALKPHOS 93  BILITOT 0.4   No results found for this basename: CHOL,  in the last 72 hours No results found for this basename: PROTIME,  in the last 72 hours  Imaging: No results found.  Cardiac Studies: Tele - atrial fib with a CVR Assessment/Plan:  1. Acute on chronic systolic CHF - while he states that he feels better, his weight is up and he will need more diuresis prior to dc with EF 10%. Continue iv lasix. 2. Atrial fib with  a CVR/RVR - continue AV nodal blocking drugs. Unable to DCCV due to LAA thrombus  LOS: 3 days    Gregg Taylor,M.D. 06/24/2012, 11:51 AM

## 2012-06-24 NOTE — Progress Notes (Signed)
Patient ID: Raymond [name], male   DOB: [birth year], 52 y.o.   MRN: [id number] Called by nurse. Patient refusing IV lasix. Will give po lasix and metolazone in an attempt to diurese. [name].D.

## 2012-06-24 NOTE — Progress Notes (Signed)
Pt refused his IV lasix today, MD notified and spoke with pt, pt agreed to take po lasix, po lasix given, pt got order to take shower, will continue to monitor pts weight

## 2012-06-25 ENCOUNTER — Encounter (HOSPITAL_COMMUNITY): Payer: Self-pay | Admitting: Cardiovascular Disease

## 2012-06-25 LAB — PROTIME-INR
INR: 2.69 — ABNORMAL HIGH (ref 0.00–1.49)
Prothrombin Time: 27.3 seconds — ABNORMAL HIGH (ref 11.6–15.2)

## 2012-06-25 LAB — BASIC METABOLIC PANEL
Chloride: 94 mEq/L — ABNORMAL LOW (ref 96–112)
GFR calc Af Amer: 59 mL/min — ABNORMAL LOW (ref >90)
Potassium: 3.5 mEq/L (ref 3.5–5.1)

## 2012-06-25 LAB — CBC
HCT: 41.6 % (ref 39.0–52.0)
Hemoglobin: 14.3 g/dL (ref 13.0–17.0)
RBC: 4.65 MIL/uL (ref 4.22–5.81)

## 2012-06-25 MED ORDER — METOPROLOL TARTRATE 50 MG PO TABS: 25.0000 mg | ORAL_TABLET | Freq: Two times a day (BID) | ORAL | Status: DC

## 2012-06-25 MED ORDER — WARFARIN SODIUM 7.5 MG PO TABS
7.5000 mg | ORAL_TABLET | Freq: Once | ORAL | Status: DC
  Filled 2012-06-25: qty 1

## 2012-06-25 MED ORDER — GUAIFENESIN-CODEINE 100-10 MG/5ML PO SOLN: 5.0000 mL | Freq: Three times a day (TID) | ORAL | Status: DC | PRN

## 2012-06-25 MED ORDER — AMIODARONE HCL 200 MG PO TABS: 200.0000 mg | ORAL_TABLET | Freq: Two times a day (BID) | ORAL | Status: DC

## 2012-06-25 MED ORDER — METOLAZONE 2.5 MG PO TABS: 2.5000 mg | ORAL_TABLET | ORAL | Status: DC

## 2012-06-25 MED ORDER — POTASSIUM CHLORIDE CRYS ER 20 MEQ PO TBCR: 20.0000 meq | EXTENDED_RELEASE_TABLET | ORAL | Status: DC

## 2012-06-25 NOTE — Progress Notes (Signed)
ANTICOAGULATION CONSULT NOTE - Follow Up Consult  Pharmacy Consult for warfarin Indication: atrial fibrillation  Allergies  Allergen Reactions  . Losartan Cough  . Valsartan Cough    Patient Measurements: Height: 5\' 9"  (175.3 cm) Weight: 270 lb (122.471 kg) (scale b) IBW/kg (Calculated) : 70.7  Vital Signs: Temp: 97.5 F (36.4 C) (06/08 0929) Temp src: Oral (06/08 0929) BP: 102/72 mmHg (06/08 0929) Pulse Rate: 90 (06/08 0929)  Labs:  Recent Labs  06/22/12 1600 06/22/12 2346 06/23/12 0405  06/23/12 1214 06/24/12 0515 06/25/12 0900  HGB  --   --   --   < > 13.5 14.2 14.3  HCT  --   --   --   --  41.6 43.9 41.6  PLT  --   --   --   --  242 273 261  LABPROT  --   --   --   --  25.4* 27.9* 27.3*  INR  --   --   --   --  2.44* 2.77* 2.69*  HEPARINUNFRC 0.28* 0.69  --   --  0.20*  --   --   CREATININE  --   --  1.41*  --   --  1.41* 1.56*  < > = values in this interval not displayed.  Estimated Creatinine Clearance: 74.1 ml/min (by C-G formula based on Cr of 1.56).   Medications:  Scheduled:  . acyclovir  800 mg Oral Daily  . amiodarone  200 mg Oral BID  . digoxin  0.125 mg Oral Daily  . furosemide  80 mg Oral BID  . levothyroxine  400 mcg Oral QAC breakfast  . losartan  50 mg Oral Daily  . metolazone  2.5 mg Oral Daily  . metoprolol  25 mg Oral BID  . potassium chloride  20 mEq Oral Daily  . sodium chloride  3 mL Intravenous Q12H  . sodium chloride  3 mL Intravenous Q12H  . spironolactone  25 mg Oral Daily  . Warfarin - Pharmacist Dosing Inpatient   Does not apply q1800    Assessment: 52yo male continues on warfarin for history of afib and CVA. Today's INR is 2.69. CBC is stable. No bleeding noted. S/p TEE which showed a possible LAA thrombus so DCCV was not completed.   Goal of Therapy:  INR 2-3   Plan:  1. Warfarin 7.5mg  PO x 1 tonight  2. F/u AM INR  Lillia Pauls, PharmD Clinical Pharmacist Pager: [phone] Phone: [phone] 06/25/2012 10:21  AM

## 2012-06-25 NOTE — Progress Notes (Signed)
Patient accepted watching HF video this morning and wants to learn Zone tool today - will review.  Asked pt to save urine for measurement and he stated he will

## 2012-06-25 NOTE — Progress Notes (Signed)
Patient ID: Raymond [name], male   DOB: February 08, 1960, 52 y.o.   MRN: [id number] Subjective:  "I lost 7 lbs yesterday and I'm ready to go home", denies chest pain or sob Objective:  Vital Signs in the last 24 hours: Temp:  [97.3 F (36.3 C)-98.3 F (36.8 C)] 97.5 F (36.4 C) (06/08 0929) Pulse Rate:  [75-90] 90 (06/08 0929) Resp:  [18-20] 20 (06/08 0929) BP: (97-115)/(60-87) 102/72 mmHg (06/08 0929) SpO2:  [98 %-99 %] 98 % (06/08 0929) Weight:  [270 lb (122.471 kg)] 270 lb (122.471 kg) (06/08 0529)  Intake/Output from previous day: 06/07 0701 - 06/08 0700 In: 1163 [P.O.:1160; I.V.:3] Out: -  Intake/Output from this shift:    Physical Exam: obese appearing NAD HEENT: Unremarkable Neck:  7 cm JVD, no thyromegally Lungs:  Clear with scattered wheezes, no increased work of breathing HEART:  Regular rate rhythm, no murmurs, no rubs, no clicks Abd:  Obese, soft, positive bowel sounds, no organomegally, no rebound, no guarding Ext:  2 plus pulses, no edema, no cyanosis, no clubbing Skin:  No rashes no nodules Neuro:  CN II through XII intact, motor grossly intact  Lab Results:  Recent Labs  06/24/12 0515 06/25/12 0900  WBC 7.8 7.7  HGB 14.2 14.3  PLT 273 261    Recent Labs  06/23/12 0405 06/24/12 0515  NA 136 135  K 3.8 3.6  CL 97 94*  CO2 29 29  GLUCOSE 95 109*  BUN 22 22  CREATININE 1.41* 1.41*   No results found for this basename: TROPONINI, CK, MB,  in the last 72 hours Hepatic Function Panel No results found for this basename: PROT, ALBUMIN, AST, ALT, ALKPHOS, BILITOT, BILIDIR, IBILI,  in the last 72 hours No results found for this basename: CHOL,  in the last 72 hours No results found for this basename: PROTIME,  in the last 72 hours  Imaging: No results found.  Cardiac Studies: Tele - nsr Assessment/Plan:  1. Acute on chronic systolic/diastolic CHF 2. Massive obesity 3. HTN Rec: ok for discharge home, followup with Dr. Davonna Belling. Add metolzone to discharge  medications, taken twice a week.  LOS: 4 days    Raymond [name],M.D. 06/25/2012, 9:49 AM

## 2012-06-25 NOTE — Progress Notes (Signed)
Discharged for home via wheelchair escort to vehicle transport from home.  No voiced complaints.  Stated understanding of all instructions, including f/u appts.  Prescriptions given.

## 2012-06-25 NOTE — Progress Notes (Signed)
Pt. Encouraged to use urinal, however still refuses to use it.  Pt. Rested comfortably throughout the night.

## 2012-06-25 NOTE — Discharge Summary (Signed)
Discharge Summary   Patient ID: Raymond [name], MRN: [id number], DOB/AGE: 50/15/1962 50 y.o.  Admit date: 06/21/2012 Discharge date: 06/25/2012   Primary Care Physician:  [facility]   Primary Cardiologist:  Dr. [name]   Reason for Admission:  AFib with RVR; A/C Systolic CHF  Primary Discharge Diagnoses:  1. Atrial Fibrillation with RVR - rate controlled at d/c 2. A/C Systolic CHF - compensated at d/c 3. Hypothyroidism 4. Hypertension 5. Chronic Kidney Disease  Secondary Discharge Diagnoses:   Past Medical History  Diagnosis Date  . Systolic CHF, chronic   . Non-ischemic cardiomyopathy     a. echo 11/10: EF 15%;   b. cath 10/08: normal cors;  c. Echo 8/12: EF 20-25%;  d. Myoview 5/12: Very mild distal ant and apical isch, EF 17% (low risk-medical Rx cont'd);  e.  Echo 4/14:  EF 15%, diff HK, severe diast dysfn, restrictive physiology, E/medial e' > 15 suggests LVEDP at least 20 mmHg, borderline dilated Ao root, mild MR, mod LAE, mod reduced RVSF, mod TR, PASP 64  . Atrial fibrillation     ON COUMADIN  . Hypertension   . Hypothyroidism     s/p RAI therapy  . Obesity   . Non-compliance   . Dyslexia   . 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 RBBB (right bundle branch block)   . Palpitations     W/WIDE COMPLEX TACHYCARDIA IN THE PAST, REFUSED REPEATED OFFERS OF AN ICD  . Biventricular ICD  st Judes]   . Shortness of breath   . Stroke 11/2009     Allergies:    Allergies  Allergen Reactions  . Losartan Cough  . Valsartan Cough      Procedures Performed This Admission:    Transesophageal Echocardiogram 06/23/12 (Dr. Delane Ginger)  Hospital Course:  Raymond [name] is a 52 y.o. 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 LHC 10/2006:  Normal coronary arteries.  Myoview 5/12: Very mild distal  anterior and apical ischemia, EF 17% (low risk-medical therapy continued). Echo 8/12: EF 20-25%, diffuse HK, grade 1 diastolic dysfunction, mild LAE. He is on amiodarone Rx. TSH was previously noted to be very high and he was referred back to endocrinology. Synthroid was increased and he was kept on amiodarone. F/u Echo 4/14: EF 15%, diff HK, severe diast dysfn, restrictive physiology, E/medial e' > 15 suggests LVEDP at least 20 mmHg, borderline dilated Ao root, mild MR, mod LAE, mod reduced RVSF, mod TR, PASP 64. He was seen in the office recently and noted to be volume overloaded. In follow up, he was noted to be in atrial fibrillation with RVR and continued volume overload. He was admitted for further treatment with an eye towards TEE guided cardioversion.  Of note he had significant cough.    His device was interrogated and demonstrated that he was in AFib for 16 days prior to admission.  Parameter were changed per Dr. Sherryl Manges.  INR was sub-therapeutic. He was placed on IV heparin.  He was also placed on IV Lasix.  Initially presented for TEE 06/22/12. His INR was 1.7 and his heparin drip had been turned off. Procedure was canceled. He did have a TEE 06/23/12. There was a question of left atrial appendage thrombus. No cardioversion was performed because of the concern for clot.  Coumadin was managed by pharmacy. Heart rate remained controlled. Of note, his amiodarone was increased to 200 mg twice daily upon admission. Patient refused more IV Lasix in attempt to diurese him as he continued to display signs of volume overload. Metolazone was added to his medical regimen.  Volume improved.  Weight went down 7 pounds with the addition of metolazone.  Ins and outs were incomplete.  Patient was evaluated by Dr. Lewayne Bunting this AM and felt to be improved and stable for d/c to home. He will be sent home on metolazone 2.5 mg QD on Mon and Thurs.  Case management has arranged HHRN for him.  We will arrange TCM follow  up in 1 week.  He will need to be followed in the coumadin clinic weekly and plan DCCV in 3-4 weeks.    Discharge Vitals:   Blood pressure 102/72, pulse 90, temperature 97.5 F (36.4 C), temperature source Oral, resp. rate 20, height 5\' 9"  (1.753 m), weight 270 lb (122.471 kg), SpO2 98.00%.  Labs:  Recent Labs  06/23/12 1214 06/24/12 0515 06/25/12 0900  WBC 6.7 7.8 7.7  HGB 13.5 14.2 14.3  HCT 41.6 43.9 41.6  MCV 90.8 91.5 89.5  PLT 242 273 261    Recent Labs  06/23/12 0405 06/24/12 0515 06/25/12 0900  NA 136 135 135  K 3.8 3.6 3.5  CL 97 94* 94*  CO2 29 29 31   BUN 22 22 23   CREATININE 1.41* 1.41* 1.56*  CALCIUM 9.0 9.5 9.5    Lab Results  Component Value Date   TSH 0.999 06/21/2012    Recent Labs  06/23/12 1214 06/24/12 0515 06/25/12 0900  INR 2.44* 2.77* 2.69*    Diagnostic Procedures and Studies:  Dg Chest 2 View  06/22/2012    IMPRESSION: Stable cardiomegaly with defibrillator.  No active lung disease.   Original Report Authenticated By: Dwyane Dee, M.D.    Transesophageal Echocardiogram 06/23/12:  Left Ventrical: Markedly depressed LV function. EF 10%  Mitral Valve: mild MR  Aortic Valve: normal AV  Tricuspid Valve: mild - mod TR  Pulmonic Valve: no siginificant PI  Left Atrium/ Left atrial appendage: there is extensive spontaneous contrast in the LA and LAA. There is a question of a mass in the LAA that could be a thrombus. It is associated with extensive spontaneous contrast "smoke"  Atrial septum: grossly normal , bubble study was not done  Aorta: normal  Complications: No apparent complications  Patient did tolerate procedure well.  Cardioversion was not done because of this question of thrombus   Disposition:   Pt is being discharged home today in good condition.  Follow-up Plans & Appointments      Follow-up Information   Follow up with [facility]) In 1 week. (will arrange lab test BMET this week)    Contact  information:   [address], [address] [place] [address] [phone]      Follow up with  [facility] In 1 week. (office will call to arrange)    Contact information:   [address] [place] [address] [phone]      Follow up with [name], MD In 1 week. (office will call to arrange appointment with Dr. Antoine Poche or the PA)    Contact information:   [address] [place] [address] [phone]       Call Dow Adolph, MD. (please call to arrange follow up)  Contact information:   [address] [place] [address] [phone]       Discharge Medications    Medication List    TAKE these medications       acyclovir 800 MG tablet  Commonly known as:  ZOVIRAX  Take 800 mg by mouth daily.     ALPRAZolam 0.25 MG tablet  Commonly known as:  XANAX  Take 0.25 mg by mouth at bedtime as needed for sleep.     amiodarone 200 MG tablet  Commonly known as:  PACERONE  Take 1 tablet (200 mg total) by mouth 2 (two) times daily.     digoxin 0.125 MG tablet  Commonly known as:  LANOXIN  Take 0.125 mg by mouth daily.     furosemide 80 MG tablet  Commonly known as:  LASIX  Take 80 mg by mouth 2 (two) times daily.     guaiFENesin-codeine 100-10 MG/5ML syrup  Take 5 mLs by mouth 3 (three) times daily as needed for cough.     levothyroxine 200 MCG tablet  Commonly known as:  SYNTHROID, LEVOTHROID  Take 400 mcg by mouth daily before breakfast.     losartan 50 MG tablet  Commonly known as:  COZAAR  Take 1 tablet (50 mg total) by mouth daily.     metolazone 2.5 MG tablet  Commonly known as:  ZAROXOLYN  Take 1 tablet (2.5 mg total) by mouth 2 (two) times a week. Take 30 minutes before Lasix on Mondays and Thursdays     metoprolol 50 MG tablet  Commonly known as:  LOPRESSOR  Take 0.5 tablets (25 mg total) by mouth 2 (two) times daily.     potassium chloride SA 20 MEQ tablet  Commonly  known as:  K-DUR,KLOR-CON  Take 1 tablet (20 mEq total) by mouth 2 (two) times a week. Take on Mondays and Thursdays with Metolazone.     spironolactone 50 MG tablet  Commonly known as:  ALDACTONE  Take 25 mg by mouth daily.     warfarin 7.5 MG tablet  Commonly known as:  COUMADIN  TAKE AS DIRECTED BY ANTICOAGULATION     CLINIC     warfarin 7.5 MG tablet  Commonly known as:  COUMADIN  Take 7.5-11.25 mg by mouth daily. Takes 7.5mg  every day except for 11.25mg  on Friday         Outstanding Labs/Studies  1. PT/INR with coumadin clinic in 1 week 2. BMET in 1 week  Duration of Discharge Encounter: Greater than 30 minutes including physician and PA time.  SignedTereso Newcomer, PA-C  11:54 AM 06/25/2012

## 2012-06-25 NOTE — Plan of Care (Signed)
Problem: Phase III Progression Outcomes Goal: Discharge plan remains appropriate-arrangements made Outcome: Completed/Met Date Met:  06/25/12 Discharge to home when ordered

## 2012-06-25 NOTE — Progress Notes (Signed)
Pt. Refused morning labs to be drawn.  Will let day shift nurse know and continue to monitor.

## 2012-06-26 ENCOUNTER — Telehealth: Payer: Self-pay | Admitting: Cardiology

## 2012-06-26 NOTE — Telephone Encounter (Signed)
New Problem:    Patient's mother called in because the patient's home health nurse told the patient that she could draw his INR at home but would need orders form our office first.  Please call back.

## 2012-06-26 NOTE — Telephone Encounter (Signed)
pt notified about TCM appt on 6/17 3 pm w/Scott W. PA same day  is in the office. pt states he has [facility] coming out now. Reminded pt of his lab and CVRR appt 6/11. pt verbalized understanding

## 2012-06-27 ENCOUNTER — Telehealth: Payer: Self-pay | Admitting: Cardiology

## 2012-06-27 NOTE — Telephone Encounter (Signed)
Called spoke with pt advised since [facility] in home at present will cancel appt tomorrow as requested and will have [facility] RN draw tomorrow while at the home.   Called spoke with Octaviano Batty, RN [facility] gave verbal order to check PT/INR on 06/28/12 while in the home seeing pt.

## 2012-06-27 NOTE — Telephone Encounter (Signed)
New problem    Pt wants to push his coumadin appt out more than 5 days

## 2012-06-28 ENCOUNTER — Telehealth: Payer: Self-pay | Admitting: *Deleted

## 2012-06-28 ENCOUNTER — Other Ambulatory Visit: Payer: Medicare Other

## 2012-06-28 ENCOUNTER — Ambulatory Visit (INDEPENDENT_AMBULATORY_CARE_PROVIDER_SITE_OTHER): Payer: Medicare Other | Admitting: Internal Medicine

## 2012-06-28 DIAGNOSIS — I4891 Unspecified atrial fibrillation: Secondary | ICD-10-CM

## 2012-06-28 DIAGNOSIS — Z7901 Long term (current) use of anticoagulants: Secondary | ICD-10-CM

## 2012-06-28 NOTE — Telephone Encounter (Signed)
Requested order for pt/inr from home health nurse, I gave one time order and told to call into coumadin clinic, [phone].

## 2012-06-28 NOTE — Telephone Encounter (Signed)
See telephone note from 06/27/12 verbal order already given to Octaviano Batty, RN case manager at [facility] to draw PT/INR today 06/28/12 while at pt's home and call results to Coumadin Clinic.  Awaiting results from today's home visit.

## 2012-07-03 ENCOUNTER — Ambulatory Visit (INDEPENDENT_AMBULATORY_CARE_PROVIDER_SITE_OTHER): Payer: Medicare Other | Admitting: Cardiovascular Disease

## 2012-07-03 ENCOUNTER — Telehealth: Payer: Self-pay | Admitting: Cardiology

## 2012-07-03 DIAGNOSIS — I4891 Unspecified atrial fibrillation: Secondary | ICD-10-CM

## 2012-07-03 DIAGNOSIS — Z7901 Long term (current) use of anticoagulants: Secondary | ICD-10-CM

## 2012-07-03 LAB — POCT INR: INR: 4.1

## 2012-07-03 NOTE — Telephone Encounter (Signed)
New problem   They have had an increase in ankle measurements and abdominal mesurements and 7lb weight gain.Please call [facility]

## 2012-07-03 NOTE — Telephone Encounter (Signed)
Left message for Amber to call back.  

## 2012-07-04 ENCOUNTER — Ambulatory Visit (INDEPENDENT_AMBULATORY_CARE_PROVIDER_SITE_OTHER): Payer: Medicare Other | Admitting: Physician Assistant

## 2012-07-04 ENCOUNTER — Encounter: Payer: Self-pay | Admitting: Physician Assistant

## 2012-07-04 VITALS — BP 118/70 | HR 97 | Ht 69.0 in | Wt 278.0 lb

## 2012-07-04 DIAGNOSIS — I5023 Acute on chronic systolic (congestive) heart failure: Secondary | ICD-10-CM

## 2012-07-04 DIAGNOSIS — I10 Essential (primary) hypertension: Secondary | ICD-10-CM

## 2012-07-04 DIAGNOSIS — N189 Chronic kidney disease, unspecified: Secondary | ICD-10-CM

## 2012-07-04 DIAGNOSIS — I4891 Unspecified atrial fibrillation: Secondary | ICD-10-CM

## 2012-07-04 DIAGNOSIS — R0602 Shortness of breath: Secondary | ICD-10-CM

## 2012-07-04 MED ORDER — POTASSIUM CHLORIDE CRYS ER 20 MEQ PO TBCR: 20.0000 meq | EXTENDED_RELEASE_TABLET | ORAL | Status: DC

## 2012-07-04 MED ORDER — METOPROLOL TARTRATE 50 MG PO TABS: 50.0000 mg | ORAL_TABLET | ORAL | Status: DC

## 2012-07-04 MED ORDER — METOLAZONE 2.5 MG PO TABS: 2.5000 mg | ORAL_TABLET | ORAL | Status: DC

## 2012-07-04 NOTE — Patient Instructions (Addendum)
LABS TODAY; BMET, BNP  REPEAT BMET IN  1WEEK  MAKE SURE TO HAVE YOUR INR CHECKED WEEKLY DUE TO POSSIBLE CARDIOVERSION IN 4 WEEKS  TODAY WHEN YOU GET HOME TAKE LASIX 120 MG   INCREASE METOLAZONE TO 2.5 MG ON MON, WED, AND FRI'S TAKE 30 MINUTES BEFORE MORNING LASIX DOSE  INCREASE POTASSIUM TO MON, WED, AND FRI'S WHEN YOU TAKE YOUR METOLAZONE  INCREASE METOPROLOL TARTRATE TO 50 MG IN THE MORNING AND 25 MG IN THE PM  PLEASE FOLLOW UP WITH SCOTT WEAVER, PAC 2 WEEKS SAME DAY DR. Antoine Poche IS IN THE OFFICE

## 2012-07-04 NOTE — Progress Notes (Signed)
[address], [address] [place], [place]  [address] Phone: [phone], Fax:  [phone]  Date:  07/04/2012   ID:  Raymond [name], DOB 07-13-1960, MRN [id number]  PCP:  Dow Adolph, MD  Cardiologist:  Dr. [name]  Electrophysiologist:  Dr. Sherryl Manges     History of Present Illness: Raymond [name] is a 52 y.o. male who returns for follow up after a recent hospital admission for a/c systolic CHF in the setting of AFib with RVR. 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 LHC in 2008 with normal cors.  Myoview 5/12: Very mild distal anterior and apical ischemia, EF 17% (low risk-medical therapy continued). Echo 8/12: EF 20-25%.  He is on amiodarone.  TSH was previously noted to be very high and he was referred back to endocrinology.  Synthroid was increased and he was kept on amiodarone.   F/u Echo 4/14:  EF 15%, diff HK, severe diast dysfn, restrictive physiology, E/medial e' > 15 suggests LVEDP at least 20 mmHg, borderline dilated Ao root, mild MR, mod LAE, mod reduced RVSF, mod TR, PASP 64.    He was admitted from the office 6/4 after presenting for f/u of volume overload.  He was noted to be in AFib with RVR.  He was not felt to be able to tolerate this rhythm very well given his cardiomyopathy and a/c systolic CHF.  He was admitted for IV diuresis with an eye towards TEE-DCCV.  Device interrogation demonstrated AFib for 16 days prior to admission.  TEE 06/23/12: EF 10%, mild MR, mild to mod TR, ? LAA clot.  He did not have DCCV b/c of probable LAA clot.  INR was sub-therapeutic and he was covered with IV heparin.  He will need weekly INRs in hopes that DCCV can be done in ~ 4 weeks.    Since d/c, he has noted a 7 lb weight gain.  He notes orthopnea last night.  No PND.  No chest pain.  No syncope.  LE edema is increased.  Cough is much improved.    Labs (1/14):   K 4.3,  creatinine 1.5, TSH 38.76, T4 0.42, T3 1.7 Labs (3/14):   TSH 23.66 Labs (4/14):   TSH 1.862 Labs (5/14):   K 3.8, Cr 1.8, BNP 658 Labs (6/14):   K 3.5, Cr 1.56, ALT 17, proBNP 2636, Hgb 14.3, TSH 0.999  Wt Readings from Last 3 Encounters:  07/04/12 278 lb (126.1 kg)  06/25/12 270 lb (122.471 kg)  06/25/12 270 lb (122.471 kg)     Past Medical History  Diagnosis Date  . Systolic CHF, chronic   . Non-ischemic cardiomyopathy     a. echo 11/10: EF 15%;   b. cath 10/08: normal cors;  c. Echo 8/12: EF 20-25%;  d. Myoview 5/12: Very mild distal ant and apical isch, EF 17% (low risk-medical Rx cont'd);  e.  Echo 4/14:  EF 15%, diff HK, severe diast dysfn, restrictive physiology, E/medial e' > 15 suggests LVEDP at least 20 mmHg, borderline dilated Ao root, mild MR, mod LAE, mod reduced RVSF, mod TR, PASP 64  . Atrial fibrillation     ON COUMADIN  . Hypertension   . Hypothyroidism     s/p RAI therapy  . Obesity   . Non-compliance   . Dyslexia   . History of CVA (cerebrovascular accident) 11/2008    right brain  CVA 11/10 tx with tPA and PTA and stent  . RBBB (right bundle branch block)   . Palpitations     W/WIDE COMPLEX TACHYCARDIA IN THE PAST, REFUSED REPEATED OFFERS OF AN ICD  . Biventricular ICD  st Judes]   . Shortness of breath   . Stroke 11/2009    Current Outpatient Prescriptions  Medication Sig Dispense Refill  . acyclovir (ZOVIRAX) 800 MG tablet Take 800 mg by mouth daily.      Marland Kitchen ALPRAZolam (XANAX) 0.25 MG tablet Take 0.25 mg by mouth at bedtime as needed for sleep.      Marland Kitchen amiodarone (PACERONE) 200 MG tablet Take 1 tablet (200 mg total) by mouth 2 (two) times daily.  60 tablet  5  . digoxin (LANOXIN) 0.125 MG tablet Take 0.125 mg by mouth daily.      . furosemide (LASIX) 80 MG tablet Take 80 mg by mouth 2 (two) times daily.      Marland Kitchen guaiFENesin-codeine 100-10 MG/5ML syrup Take 5 mLs by mouth 3 (three) times daily as needed for cough.  120 mL  0  . levothyroxine (SYNTHROID,  LEVOTHROID) 200 MCG tablet Take 400 mcg by mouth daily before breakfast.      . losartan (COZAAR) 50 MG tablet Take 1 tablet (50 mg total) by mouth daily.  1 tablet  0  . metolazone (ZAROXOLYN) 2.5 MG tablet Take 1 tablet (2.5 mg total) by mouth 2 (two) times a week. Take 30 minutes before Lasix on Mondays and Thursdays  10 tablet  5  . metoprolol (LOPRESSOR) 50 MG tablet Take 0.5 tablets (25 mg total) by mouth 2 (two) times daily.      . potassium chloride SA (K-DUR,KLOR-CON) 20 MEQ tablet Take 1 tablet (20 mEq total) by mouth 2 (two) times a week. Take on Mondays and Thursdays with Metolazone.  10 tablet  5  . spironolactone (ALDACTONE) 50 MG tablet Take 25 mg by mouth daily.      Marland Kitchen warfarin (COUMADIN) 7.5 MG tablet TAKE AS DIRECTED BY ANTICOAGULATION     CLINIC  50 tablet  3  . warfarin (COUMADIN) 7.5 MG tablet Take 7.5-11.25 mg by mouth daily. Takes 7.5mg  every day except for 11.25mg  on Friday       No current facility-administered medications for this visit.    Allergies:    Allergies  Allergen Reactions  . Losartan Cough  . Valsartan Cough    Social History:  The patient  reports that he has never smoked. He has never used smokeless tobacco. He reports that he does not drink alcohol or use illicit drugs.    ROS:  See the HPI.    All other systems reviewed and negative.   PHYSICAL EXAM: VS:  BP 118/70  Pulse 97  Ht 5\' 9"  (1.753 m)  Wt 278 lb (126.1 kg)  BMI 41.03 kg/m2 Well nourished, well developed, in no acute distress HEENT: normal. Neck: + JVD at 90 degrees Cardiac:  normal S1, S2; irregularly irregular rhythm; no murmur Lungs:  clear to auscultation bilaterally, no wheezing, rhonchi or rales Abd: soft, nontender, no hepatomegaly Ext: trace-1+ bilateral ankle edema Skin: warm and dry Neuro:  CNs 2-12 intact, no focal abnormalities noted  EKG:  AFib, HR 97, V paced  ASSESSMENT AND PLAN:  1. Acute on Chronic Systolic CHF:  He is volume overloaded. He has not taken  Lasix that today. He will take 120 mg when he returns home. I will increase his metolazone to Monday,  Wednesday, Friday. He'll take potassium with this. Check a basic metabolic panel and BNP today. Repeat a basic metabolic panel in one week. 2. Atrial Fibrillation:  Rate is better controlled. I will increase metoprolol to 50 mg in the morning and 25 mg in the evening.  We will plan on having his INR checked weekly with an eye towards cardioversion in 4 weeks. 3. Hypothyroidism:  F/u by endocrine.    4. Hypertension:  Controlled.  5. CKD:  Monitor renal function closely with adjustments in diuretics. 6. Disposition:  Followup with me in 2 weeks.    Signed, Tereso Newcomer, PA-C  07/04/2012 2:55 PM

## 2012-07-05 LAB — BASIC METABOLIC PANEL
CO2: 28 mEq/L (ref 19–32)
Chloride: 106 mEq/L (ref 96–112)
Creatinine, Ser: 1.4 mg/dL (ref 0.4–1.5)
Glucose, Bld: 117 mg/dL — ABNORMAL HIGH (ref 70–99)

## 2012-07-05 NOTE — Telephone Encounter (Signed)
Follow up   Please call Amber she was returning your call.

## 2012-07-06 ENCOUNTER — Telehealth: Payer: Self-pay | Admitting: *Deleted

## 2012-07-06 NOTE — Telephone Encounter (Signed)
pt notified about lab results with verbal understanding today.  

## 2012-07-06 NOTE — Telephone Encounter (Signed)
Message copied by Tarri Fuller on Thu Jul 06, 2012  1:40 PM ------      Message from: [place], [place] T      Created: Wed Jul 05, 2012  9:52 PM       K+ and creatinine ok      BNP improved      Continue with current treatment plan.      Tereso Newcomer, PA-C        07/05/2012 9:52 PM ------

## 2012-07-06 NOTE — Telephone Encounter (Signed)
Pt was seen in the office by Tereso Newcomer, PA on 6/17 and medications and lab work were done.  Pt will return for follow up in 2 weeks as scheduled.

## 2012-07-07 ENCOUNTER — Ambulatory Visit (INDEPENDENT_AMBULATORY_CARE_PROVIDER_SITE_OTHER): Payer: Medicare Other | Admitting: Cardiovascular Disease

## 2012-07-07 DIAGNOSIS — Z7901 Long term (current) use of anticoagulants: Secondary | ICD-10-CM

## 2012-07-07 DIAGNOSIS — I4891 Unspecified atrial fibrillation: Secondary | ICD-10-CM

## 2012-07-07 LAB — POCT INR: INR: 3.7

## 2012-07-11 ENCOUNTER — Encounter: Payer: Self-pay | Admitting: Cardiology

## 2012-07-17 ENCOUNTER — Ambulatory Visit (INDEPENDENT_AMBULATORY_CARE_PROVIDER_SITE_OTHER): Payer: Medicare Other | Admitting: Cardiology

## 2012-07-17 ENCOUNTER — Other Ambulatory Visit: Payer: Self-pay | Admitting: Cardiology

## 2012-07-17 DIAGNOSIS — I4891 Unspecified atrial fibrillation: Secondary | ICD-10-CM

## 2012-07-17 DIAGNOSIS — Z7901 Long term (current) use of anticoagulants: Secondary | ICD-10-CM

## 2012-07-17 LAB — POCT INR: INR: 3.1

## 2012-07-25 ENCOUNTER — Ambulatory Visit: Payer: Medicare Other | Admitting: Physician Assistant

## 2012-07-25 DIAGNOSIS — I10 Essential (primary) hypertension: Secondary | ICD-10-CM

## 2012-07-25 DIAGNOSIS — R0602 Shortness of breath: Secondary | ICD-10-CM

## 2012-07-25 DIAGNOSIS — N189 Chronic kidney disease, unspecified: Secondary | ICD-10-CM

## 2012-07-26 ENCOUNTER — Encounter: Payer: Self-pay | Admitting: *Deleted

## 2012-07-26 ENCOUNTER — Ambulatory Visit (INDEPENDENT_AMBULATORY_CARE_PROVIDER_SITE_OTHER): Payer: Medicare Other | Admitting: Physician Assistant

## 2012-07-26 ENCOUNTER — Encounter: Payer: Self-pay | Admitting: Physician Assistant

## 2012-07-26 ENCOUNTER — Ambulatory Visit (INDEPENDENT_AMBULATORY_CARE_PROVIDER_SITE_OTHER): Payer: Medicare Other | Admitting: *Deleted

## 2012-07-26 ENCOUNTER — Telehealth: Payer: Self-pay | Admitting: *Deleted

## 2012-07-26 VITALS — BP 118/79 | HR 106 | Ht 69.0 in | Wt 279.0 lb

## 2012-07-26 DIAGNOSIS — R0602 Shortness of breath: Secondary | ICD-10-CM

## 2012-07-26 DIAGNOSIS — I10 Essential (primary) hypertension: Secondary | ICD-10-CM

## 2012-07-26 DIAGNOSIS — I5022 Chronic systolic (congestive) heart failure: Secondary | ICD-10-CM

## 2012-07-26 DIAGNOSIS — N189 Chronic kidney disease, unspecified: Secondary | ICD-10-CM

## 2012-07-26 DIAGNOSIS — I4891 Unspecified atrial fibrillation: Secondary | ICD-10-CM

## 2012-07-26 DIAGNOSIS — E039 Hypothyroidism, unspecified: Secondary | ICD-10-CM

## 2012-07-26 DIAGNOSIS — Z7901 Long term (current) use of anticoagulants: Secondary | ICD-10-CM

## 2012-07-26 LAB — BRAIN NATRIURETIC PEPTIDE: Pro B Natriuretic peptide (BNP): 324 pg/mL — ABNORMAL HIGH (ref 0.0–100.0)

## 2012-07-26 LAB — BASIC METABOLIC PANEL
Chloride: 97 mEq/L (ref 96–112)
GFR: 48.56 mL/min — ABNORMAL LOW (ref >60.00)
Glucose, Bld: 109 mg/dL — ABNORMAL HIGH (ref 70–99)
Potassium: 3.3 mEq/L — ABNORMAL LOW (ref 3.5–5.1)
Sodium: 136 mEq/L (ref 135–145)

## 2012-07-26 MED ORDER — METOPROLOL TARTRATE 50 MG PO TABS: 50.0000 mg | ORAL_TABLET | Freq: Two times a day (BID) | ORAL | Status: DC

## 2012-07-26 NOTE — Telephone Encounter (Signed)
pt notified about plan of action to take extra 40 meq K+ today, bmet to be done with [facility] next week. Pt verbalized understanding to all instructions today.

## 2012-07-26 NOTE — Progress Notes (Signed)
[address] [place], [place]  [address] Phone: [phone], Fax:  [phone]  Date:  07/26/2012   ID:  Raymond [name], DOB [birth year], MRN [id number]  PCP:  Dow Adolph, MD  Cardiologist:  Dr. [name]  Electrophysiologist:  Dr. Sherryl Manges     History of Present Illness: Raymond [name] is a 52 y.o. male who returns for follow up.  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 LHC in 2008 with normal cors.  Myoview 5/12: Very mild distal anterior and apical ischemia, EF 17% (low risk-medical therapy continued). Echo 8/12: EF 20-25%.  He is on amiodarone.  TSH was previously noted to be very high and he was referred back to endocrinology.  Synthroid was increased and he was kept on amiodarone.   F/u Echo 4/14:  EF 15%, diff HK, severe diast dysfn, restrictive physiology, E/medial e' > 15 suggests LVEDP at least 20 mmHg, borderline dilated Ao root, mild MR, mod LAE, mod reduced RVSF, mod TR, PASP 64.    He was recently admitted from the office last month with a/c systolic CHF in the setting of AFib with RVR.  TEE 06/23/12: EF 10%, mild MR, mild to mod TR, ? LAA clot.  He did not have DCCV b/c of probable LAA clot.  INR was sub-therapeutic and he was covered with IV heparin.   I saw him in f/u 07/04/12.  He remains somewhat volume overloaded.  His INRs have been therapeutic since d/c from the hospital.  He continues to have significant DOE.  He is NYHA Class III.  He noted worsening dyspnea yesterday and took extra metolazone.  He feels better today.  Sleeps on 3 pillows chronically.  Has had episodes of PND.  No CP. No syncope.    Labs (1/14):   K 4.3, creatinine 1.5, TSH 38.76, T4 0.42, T3 1.7 Labs (3/14):   TSH 23.66 Labs (4/14):   TSH 1.862 Labs (5/14):   K 3.8, Cr 1.8, BNP 658 Labs (6/14):   K 3.5, Cr 1.56=>1.4=>1.32, ALT 17, proBNP 2636, Hgb 14.3, TSH  0.999  Lab Results  Component Value Date   INR 3.0 07/26/2012   INR 3.1 07/17/2012   INR 3.7 07/07/2012      Wt Readings from Last 3 Encounters:  07/26/12 279 lb (126.554 kg)  07/04/12 278 lb (126.1 kg)  06/25/12 270 lb (122.471 kg)     Past Medical History  Diagnosis Date  . Systolic CHF, chronic   . Non-ischemic cardiomyopathy     a. echo 11/10: EF 15%;   b. cath 10/08: normal cors;  c. Echo 8/12: EF 20-25%;  d. Myoview 5/12: Very mild distal ant and apical isch, EF 17% (low risk-medical Rx cont'd);  e.  Echo 4/14:  EF 15%, diff HK, severe diast dysfn, restrictive physiology, E/medial e' > 15 suggests LVEDP at least 20 mmHg, borderline dilated Ao root, mild MR, mod LAE, mod reduced RVSF, mod TR, PASP 64  . Atrial fibrillation     ON COUMADIN  . Hypertension   . Hypothyroidism     s/p RAI therapy  . Obesity   . Non-compliance   . Dyslexia   . 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 RBBB (right bundle branch block)   . Palpitations  W/WIDE COMPLEX TACHYCARDIA IN THE PAST, REFUSED REPEATED OFFERS OF AN ICD  . Biventricular ICD  st Judes]   . Shortness of breath   . Stroke 11/2009    Current Outpatient Prescriptions  Medication Sig Dispense Refill  . acyclovir (ZOVIRAX) 800 MG tablet Take 800 mg by mouth daily.      Marland Kitchen ALPRAZolam (XANAX) 0.25 MG tablet Take 0.25 mg by mouth at bedtime as needed for sleep.      Marland Kitchen amiodarone (PACERONE) 200 MG tablet Take 1 tablet (200 mg total) by mouth 2 (two) times daily.  60 tablet  5  . digoxin (LANOXIN) 0.125 MG tablet Take 0.125 mg by mouth daily.      . furosemide (LASIX) 80 MG tablet Take 80 mg by mouth 2 (two) times daily.      Marland Kitchen levothyroxine (SYNTHROID, LEVOTHROID) 200 MCG tablet Take 400 mcg by mouth daily before breakfast.      . losartan (COZAAR) 50 MG tablet Take 1 tablet (50 mg total) by mouth daily.  1 tablet  0  . metolazone (ZAROXOLYN) 2.5 MG tablet Take 1  tablet (2.5 mg total) by mouth 3 (three) times a week. Take 30 minutes before Lasix on Mondays, WED AND FRI'S  15 tablet  5  . metoprolol (LOPRESSOR) 50 MG tablet Take 1 tablet (50 mg total) by mouth as directed. TAKE 50 MG IN THE AM AND 25 MG IN THE PM  45 tablet  6  . potassium chloride SA (K-DUR,KLOR-CON) 20 MEQ tablet Take 1 tablet (20 mEq total) by mouth 3 (three) times a week. Take on Mondays, WED, AND FRI'S  with Metolazone.  15 tablet  5  . spironolactone (ALDACTONE) 50 MG tablet Take 25 mg by mouth daily.      Marland Kitchen warfarin (COUMADIN) 7.5 MG tablet Take 7.5-11.25 mg by mouth daily. Takes 7.5mg  every day except for 11.25mg  on Friday       No current facility-administered medications for this visit.    Allergies:    Allergies  Allergen Reactions  . Losartan Cough  . Valsartan Cough    Social History:  The patient  reports that he has never smoked. He has never used smokeless tobacco. He reports that he does not drink alcohol or use illicit drugs.    ROS:  See the HPI.  No cough.   All other systems reviewed and negative.   PHYSICAL EXAM: VS:  BP 118/79  Pulse 106  Ht 5\' 9"  (1.753 m)  Wt 279 lb (126.554 kg)  BMI 41.18 kg/m2 Well nourished, well developed, in no acute distress HEENT: normal. Neck: mildly elevated JVD at 90 degrees Cardiac:  normal S1, S2; irregularly irregular rhythm; no murmur Lungs:  clear to auscultation bilaterally, no wheezing, rhonchi or rales Abd: soft, nontender, no hepatomegaly Ext: trace bilateral ankle edema Skin: warm and dry Neuro:  CNs 2-12 intact, no focal abnormalities noted  EKG:  AFib, HR 106, V paced  ASSESSMENT AND PLAN:  1. Chronic Systolic CHF:  His CHF is worsened by his AFib with RVR.  He seems to be stable today.  Continue current diuresis.  Check BMET and BNP today.  If he does not have significant improvement with return of NSR, consider referral to [facility].  2. Atrial Fibrillation:  Rate is uncontrolled.   Increase metoprolol to 50 bid.  INR 3 today.  He has been therapeutic for >4 weeks.  I reviewed his case with Dr. Hillis Range (DOD).  Will proceed with DCCV this week.  I reviewed with Dr. Delane Ginger who did his TEE in June.  He feels like the patient can have DCCV without TEE since he has been on therapeutic coumadin for 4 weeks. 3. Hypothyroidism:  F/u by endocrine.    4. Hypertension:  Controlled.  5. CKD:  Monitor renal function closely with adjustments in diuretics. 6. Disposition:  Followup with me in 2 weeks.    Signed, Tereso Newcomer, PA-C  07/26/2012 1:00 PM

## 2012-07-26 NOTE — H&P (Signed)
History and Physical  Date:  07/26/2012   ID:  Raymond [name], DOB 28-Jun-1960, MRN [id number]  PCP:  Raymond Adolph, MD  Cardiologist:  Dr. [name]  Electrophysiologist:  Dr. Sherryl Manges     History of Present Illness: Raymond [name] is a 52 y.o. male who returns for follow up.  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 LHC in 2008 with normal cors.  Myoview 5/12: Very mild distal anterior and apical ischemia, EF 17% (low risk-medical therapy continued). Echo 8/12: EF 20-25%.  He is on amiodarone.  TSH was previously noted to be very high and he was referred back to endocrinology.  Synthroid was increased and he was kept on amiodarone.   F/u Echo 4/14:  EF 15%, diff HK, severe diast dysfn, restrictive physiology, E/medial e' > 15 suggests LVEDP at least 20 mmHg, borderline dilated Ao root, mild MR, mod LAE, mod reduced RVSF, mod TR, PASP 64.    He was recently admitted from the office last month with a/c systolic CHF in the setting of AFib with RVR.  TEE 06/23/12: EF 10%, mild MR, mild to mod TR, ? LAA clot.  He did not have DCCV b/c of probable LAA clot.  INR was sub-therapeutic and he was covered with IV heparin.   I saw him in f/u 07/04/12.  He remains somewhat volume overloaded.  His INRs have been therapeutic since d/c from the hospital.  He continues to have significant DOE.  He is NYHA Class III.  He noted worsening dyspnea yesterday and took extra metolazone.  He feels better today.  Sleeps on 3 pillows chronically.  Has had episodes of PND.  No CP. No syncope.    Labs (1/14):   K 4.3, creatinine 1.5, TSH 38.76, T4 0.42, T3 1.7 Labs (3/14):   TSH 23.66 Labs (4/14):   TSH 1.862 Labs (5/14):   K 3.8, Cr 1.8, BNP 658 Labs (6/14):   K 3.5, Cr 1.56=>1.4=>1.32, ALT 17, proBNP 2636, Hgb 14.3, TSH 0.999  Lab Results  Component Value Date   INR 3.0 07/26/2012   INR 3.1 07/17/2012   INR 3.7  07/07/2012      Wt Readings from Last 3 Encounters:  07/26/12 279 lb (126.554 kg)  07/04/12 278 lb (126.1 kg)  06/25/12 270 lb (122.471 kg)     Past Medical History  Diagnosis Date  . Systolic CHF, chronic   . Non-ischemic cardiomyopathy     a. echo 11/10: EF 15%;   b. cath 10/08: normal cors;  c. Echo 8/12: EF 20-25%;  d. Myoview 5/12: Very mild distal ant and apical isch, EF 17% (low risk-medical Rx cont'd);  e.  Echo 4/14:  EF 15%, diff HK, severe diast dysfn, restrictive physiology, E/medial e' > 15 suggests LVEDP at least 20 mmHg, borderline dilated Ao root, mild MR, mod LAE, mod reduced RVSF, mod TR, PASP 64  . Atrial fibrillation     ON COUMADIN  . Hypertension   . Hypothyroidism     s/p RAI therapy  . Obesity   . Non-compliance   . Dyslexia   . 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 RBBB (right bundle branch block)   . Palpitations     W/WIDE COMPLEX TACHYCARDIA IN THE PAST, REFUSED REPEATED OFFERS OF AN ICD  .  Biventricular ICD  st Judes]   . Shortness of breath   . Stroke 11/2009    Current Outpatient Prescriptions  Medication Sig Dispense Refill  . acyclovir (ZOVIRAX) 800 MG tablet Take 800 mg by mouth daily.      Marland Kitchen ALPRAZolam (XANAX) 0.25 MG tablet Take 0.25 mg by mouth at bedtime as needed for sleep.      Marland Kitchen amiodarone (PACERONE) 200 MG tablet Take 1 tablet (200 mg total) by mouth 2 (two) times daily.  60 tablet  5  . digoxin (LANOXIN) 0.125 MG tablet Take 0.125 mg by mouth daily.      . furosemide (LASIX) 80 MG tablet Take 80 mg by mouth 2 (two) times daily.      Marland Kitchen levothyroxine (SYNTHROID, LEVOTHROID) 200 MCG tablet Take 400 mcg by mouth daily before breakfast.      . losartan (COZAAR) 50 MG tablet Take 1 tablet (50 mg total) by mouth daily.  1 tablet  0  . metolazone (ZAROXOLYN) 2.5 MG tablet Take 1 tablet (2.5 mg total) by mouth 3 (three) times a week. Take 30 minutes before Lasix on Mondays,  WED AND FRI'S  15 tablet  5  . metoprolol (LOPRESSOR) 50 MG tablet Take 1 tablet (50 mg total) by mouth as directed. TAKE 50 MG IN THE AM AND 25 MG IN THE PM  45 tablet  6  . potassium chloride SA (K-DUR,KLOR-CON) 20 MEQ tablet Take 1 tablet (20 mEq total) by mouth 3 (three) times a week. Take on Mondays, WED, AND FRI'S  with Metolazone.  15 tablet  5  . spironolactone (ALDACTONE) 50 MG tablet Take 25 mg by mouth daily.      Marland Kitchen warfarin (COUMADIN) 7.5 MG tablet Take 7.5-11.25 mg by mouth daily. Takes 7.5mg  every day except for 11.25mg  on Friday       No current facility-administered medications for this visit.    Allergies:    Allergies  Allergen Reactions  . Losartan Cough  . Valsartan Cough    Social History:  The patient  reports that he has never smoked. He has never used smokeless tobacco. He reports that he does not drink alcohol or use illicit drugs.    ROS:  See the HPI.  No cough.   All other systems reviewed and negative.   PHYSICAL EXAM: VS:  BP 118/79  Pulse 106  Ht 5\' 9"  (1.753 m)  Wt 279 lb (126.554 kg)  BMI 41.18 kg/m2 Well nourished, well developed, in no acute distress HEENT: normal. Neck: mildly elevated JVD at 90 degrees Cardiac:  normal S1, S2; irregularly irregular rhythm; no murmur Lungs:  clear to auscultation bilaterally, no wheezing, rhonchi or rales Abd: soft, nontender, no hepatomegaly Ext: trace bilateral ankle edema Skin: warm and dry Neuro:  CNs 2-12 intact, no focal abnormalities noted  EKG:  AFib, HR 106, V paced  ASSESSMENT AND PLAN:  1. Chronic Systolic CHF:  His CHF is worsened by his AFib with RVR.  He seems to be stable today.  Continue current diuresis.  Check BMET and BNP today.  If he does not have significant improvement with return of NSR, consider referral to [facility].  2. Atrial Fibrillation:  Rate is uncontrolled.  Increase metoprolol to 50 bid.  INR 3 today.  He has been therapeutic for >4 weeks.  I reviewed  his case with Dr. Hillis Range (DOD).  Will proceed with DCCV this week.  I reviewed with Dr. Delane Ginger who  did his TEE in June.  He feels like the patient can have DCCV without TEE since he has been on therapeutic coumadin for 4 weeks. 3. Hypothyroidism:  F/u by endocrine.    4. Hypertension:  Controlled.  5. CKD:  Monitor renal function closely with adjustments in diuretics. 6. Disposition:  Followup with me in 2 weeks.    Signed, Tereso Newcomer, PA-C  07/26/2012 1:00 PM   Hillis Range, MD

## 2012-07-26 NOTE — Telephone Encounter (Signed)
Message copied by Tarri Fuller on Wed Jul 26, 2012  6:03 PM ------      Message from: [place], [place] T      Created: Wed Jul 26, 2012  5:01 PM       Take extra K+ 40 mEq today      BNP improved      Creatinine stable      Continue with current treatment plan.      Check BMET in 1 week      Tereso Newcomer, PA-C        07/26/2012 5:01 PM ------

## 2012-07-26 NOTE — Patient Instructions (Signed)
Your physician has recommended that you have a Cardioversion (DCCV). Electrical Cardioversion uses a jolt of electricity to your heart either through paddles or wired patches attached to your chest. This is a controlled, usually prescheduled, procedure. Defibrillation is done under light anesthesia in the hospital, and you usually go home the day of the procedure. This is done to get your heart back into a normal rhythm. You are not awake for the procedure. Please see the instruction sheet given to you today.CARDIOVERSION 07/27/12 @3 :15 WITH DR.NAHSER. SEE INSTRUCTION LETTER  LAB TODAY BMET, BNP  INCREASE METOPROLOL TO 50 MG TWICE DAILY

## 2012-07-27 ENCOUNTER — Ambulatory Visit (HOSPITAL_COMMUNITY): Payer: Medicare Other | Admitting: Anesthesiology

## 2012-07-27 ENCOUNTER — Encounter (HOSPITAL_COMMUNITY)
Admission: RE | Disposition: A | Discharge status: Home / Self Care | Payer: Self-pay | Source: Ambulatory Visit | Attending: Cardiovascular Disease

## 2012-07-27 ENCOUNTER — Encounter (HOSPITAL_COMMUNITY): Payer: Self-pay | Admitting: Gastroenterology

## 2012-07-27 ENCOUNTER — Ambulatory Visit (HOSPITAL_COMMUNITY)
Admission: RE | Admit: 2012-07-27 | Discharge: 2012-07-27 | Disposition: A | Discharge status: Home / Self Care | Payer: Medicare Other | Source: Ambulatory Visit | Attending: Cardiovascular Disease | Admitting: Cardiovascular Disease

## 2012-07-27 ENCOUNTER — Encounter (HOSPITAL_COMMUNITY): Payer: Self-pay | Admitting: Anesthesiology

## 2012-07-27 DIAGNOSIS — Z8673 Personal history of transient ischemic attack (TIA), and cerebral infarction without residual deficits: Secondary | ICD-10-CM | POA: Insufficient documentation

## 2012-07-27 DIAGNOSIS — I4891 Unspecified atrial fibrillation: Secondary | ICD-10-CM

## 2012-07-27 DIAGNOSIS — I10 Essential (primary) hypertension: Secondary | ICD-10-CM | POA: Insufficient documentation

## 2012-07-27 DIAGNOSIS — Z6841 Body Mass Index (BMI) 40.0 and over, adult: Secondary | ICD-10-CM | POA: Insufficient documentation

## 2012-07-27 DIAGNOSIS — R480 Dyslexia and alexia: Secondary | ICD-10-CM | POA: Insufficient documentation

## 2012-07-27 DIAGNOSIS — Z91199 Patient's noncompliance with other medical treatment and regimen due to unspecified reason: Secondary | ICD-10-CM | POA: Insufficient documentation

## 2012-07-27 DIAGNOSIS — I5022 Chronic systolic (congestive) heart failure: Secondary | ICD-10-CM | POA: Insufficient documentation

## 2012-07-27 DIAGNOSIS — Z79899 Other long term (current) drug therapy: Secondary | ICD-10-CM | POA: Insufficient documentation

## 2012-07-27 DIAGNOSIS — E039 Hypothyroidism, unspecified: Secondary | ICD-10-CM | POA: Insufficient documentation

## 2012-07-27 DIAGNOSIS — Z9119 Patient's noncompliance with other medical treatment and regimen: Secondary | ICD-10-CM | POA: Insufficient documentation

## 2012-07-27 DIAGNOSIS — Z7901 Long term (current) use of anticoagulants: Secondary | ICD-10-CM | POA: Insufficient documentation

## 2012-07-27 DIAGNOSIS — I4510 Unspecified right bundle-branch block: Secondary | ICD-10-CM | POA: Insufficient documentation

## 2012-07-27 DIAGNOSIS — Z888 Allergy status to other drugs, medicaments and biological substances status: Secondary | ICD-10-CM | POA: Insufficient documentation

## 2012-07-27 DIAGNOSIS — I509 Heart failure, unspecified: Secondary | ICD-10-CM | POA: Insufficient documentation

## 2012-07-27 HISTORY — PX: CARDIOVERSION: SHX1299

## 2012-07-27 SURGERY — CARDIOVERSION
Anesthesia: General | Wound class: Clean

## 2012-07-27 MED ORDER — SODIUM CHLORIDE 0.9 % IV SOLN: 250.0000 mL | INTRAVENOUS | Status: DC

## 2012-07-27 MED ORDER — SODIUM CHLORIDE 0.9 % IV SOLN
INTRAVENOUS | Status: DC | PRN
  Administered 2012-07-27: 15:00:00 via INTRAVENOUS

## 2012-07-27 MED ORDER — SODIUM CHLORIDE 0.9 % IJ SOLN: 3.0000 mL | Freq: Two times a day (BID) | INTRAMUSCULAR | Status: DC

## 2012-07-27 MED ORDER — SODIUM CHLORIDE 0.9 % IJ SOLN: 3.0000 mL | INTRAMUSCULAR | Status: DC | PRN

## 2012-07-27 MED ORDER — LIDOCAINE HCL (CARDIAC) 20 MG/ML IV SOLN
INTRAVENOUS | Status: DC | PRN
  Administered 2012-07-27: 20 mg via INTRAVENOUS

## 2012-07-27 MED ORDER — PROPOFOL 10 MG/ML IV BOLUS
INTRAVENOUS | Status: DC | PRN
  Administered 2012-07-27: 50 mg via INTRAVENOUS

## 2012-07-27 NOTE — Transfer of Care (Signed)
Immediate Anesthesia Transfer of Care Note  Patient: Raymond [name]  Procedure(s) Performed: Procedure(s): CARDIOVERSION (N/A)  Patient Location: Endoscopy Unit  Anesthesia Type:General  Level of Consciousness: awake, alert  and oriented  Airway & Oxygen Therapy: Patient Spontanous Breathing and Patient connected to nasal cannula oxygen  Post-op Assessment: Report given to PACU RN and Post -op Vital signs reviewed and stable  Post vital signs: Reviewed and stable  Complications: No apparent anesthesia complications

## 2012-07-27 NOTE — Interval H&P Note (Signed)
History and Physical Interval Note:  07/27/2012 3:20 PM  Raymond [name]  has presented today for surgery, with the diagnosis of AFIB  The various methods of treatment have been discussed with the patient and family. After consideration of risks, benefits and other options for treatment, the patient has consented to  Procedure(s): CARDIOVERSION (N/A) as a surgical intervention .  The patient's history has been reviewed, patient examined, no change in status, stable for surgery.  I have reviewed the patient's chart and labs.  Questions were answered to the patient's satisfaction.    The patient has a markedly reduced EF.  TEE 1 month ago showed a possible mass.  Thrombus was not able to be ruled out so we elected to continue anticoagulation for a month and then proceed with cardioversion.  He has been theraputic with his INRs.   He is scheduled for elective cardioversion. Elyn Aquas.

## 2012-07-27 NOTE — CV Procedure (Signed)
    Cardioversion Note  DELAN KSIAZEK [id number] 20-Mar-1960  Procedure: DC Cardioversion Indications: atrial fib  Procedure Details Consent: Obtained Time Out: Verified patient identification, verified procedure, site/side was marked, verified correct patient position, special equipment/implants available, Radiology Safety Procedures followed,  medications/allergies/relevent history reviewed, required imaging and test results available.  Performed  I was assisted by Dr. Lauris Chroman, resident.   The patient has been on adequate anticoagulation.  The patient received IV Lidocaine 20 mg and Propofol 50 mg  for sedation.  Synchronous cardioversion was performed at 200 joules.  The cardioversion was successful    Complications: No apparent complications Patient did tolerate procedure well.   Vesta Mixer, Montez Hageman., MD, [facility] 07/27/2012, 3:32 PM

## 2012-07-27 NOTE — Preoperative (Signed)
Beta Blockers   Reason not to administer Beta Blockers:Not Applicable 

## 2012-07-27 NOTE — Anesthesia Preprocedure Evaluation (Addendum)
Anesthesia Evaluation  Patient identified by MRN, date of birth, ID band Patient awake    Reviewed: Allergy & Precautions, H&P , NPO status , Patient's Chart, lab work & pertinent test results  Airway Mallampati: II TM Distance: <3 FB Neck ROM: Full    Dental  (+) Dental Advisory Given and Caps   Pulmonary shortness of breath,    + decreased breath sounds      Cardiovascular hypertension, +CHF + dysrhythmias Atrial Fibrillation Rhythm:Irregular Rate:Normal  Low EF   Neuro/Psych CVA    GI/Hepatic   Endo/Other  Hypothyroidism Morbid obesity  Renal/GU      Musculoskeletal   Abdominal (+) + obese,   Peds  Hematology   Anesthesia Other Findings   Reproductive/Obstetrics                          Anesthesia Physical Anesthesia Plan  ASA: III  Anesthesia Plan: General   Post-op Pain Management:    Induction: Intravenous  Airway Management Planned: Mask  Additional Equipment:   Intra-op Plan:   Post-operative Plan:   Informed Consent: I have reviewed the patients History and Physical, chart, labs and discussed the procedure including the risks, benefits and alternatives for the proposed anesthesia with the patient or authorized representative who has indicated his/her understanding and acceptance.     Plan Discussed with: CRNA and Surgeon  Anesthesia Plan Comments:         Anesthesia Quick Evaluation

## 2012-07-28 ENCOUNTER — Telehealth: Payer: Self-pay | Admitting: Cardiology

## 2012-07-28 ENCOUNTER — Encounter (HOSPITAL_COMMUNITY): Payer: Self-pay | Admitting: Cardiovascular Disease

## 2012-07-28 ENCOUNTER — Telehealth: Payer: Self-pay | Admitting: *Deleted

## 2012-07-28 NOTE — Telephone Encounter (Signed)
CALLED AND SPOKE  WITH  BECKY AT [facility]   PT TO HAVE BMET NEXT WEEK INSTRUCTED TO FAX RESULTS TO [facility] .Zack Seal

## 2012-07-28 NOTE — Telephone Encounter (Signed)
New problem   Amber stated pt had a cardioversion done yesterday and pt had chest pain this morning, vital signs are stable hr is regular, Just any FYI

## 2012-07-28 NOTE — Telephone Encounter (Signed)
**Note De-Identified  Obfuscation** Amber states that the pt has not c/o any more cp since this morning and that his HR and BP is normal. She just wants [place] to be aware.

## 2012-07-28 NOTE — Anesthesia Postprocedure Evaluation (Signed)
  Anesthesia Post-op Note  Patient: Raymond [name]  Procedure(s) Performed: Procedure(s): CARDIOVERSION (N/A)  Patient Location: PACU and Endoscopy Unit  Anesthesia Type:General  Level of Consciousness: awake and alert   Airway and Oxygen Therapy: Patient Spontanous Breathing  Post-op Pain: none  Post-op Assessment: Post-op Vital signs reviewed, Patient's Cardiovascular Status Stable, Respiratory Function Stable, Patent Airway, No signs of Nausea or vomiting and Pain level controlled  Post-op Vital Signs: stable  Complications: No apparent anesthesia complications

## 2012-08-03 ENCOUNTER — Telehealth: Payer: Self-pay | Admitting: Cardiology

## 2012-08-03 NOTE — Telephone Encounter (Signed)
Orders have been received several times - they have been given to Dr Antoine Poche and will be faxed back as soon as he can sign them.

## 2012-08-03 NOTE — Telephone Encounter (Signed)
Attempted to call but no answer will call back

## 2012-08-03 NOTE — Telephone Encounter (Signed)
New Problem  Tiffany with [facility] is calling regarding some orders that were faxed over for the doctor to sign.

## 2012-08-04 ENCOUNTER — Ambulatory Visit (INDEPENDENT_AMBULATORY_CARE_PROVIDER_SITE_OTHER): Payer: Medicare Other | Admitting: Pharmacist

## 2012-08-04 ENCOUNTER — Encounter: Payer: Self-pay | Admitting: Cardiology

## 2012-08-04 DIAGNOSIS — I4891 Unspecified atrial fibrillation: Secondary | ICD-10-CM

## 2012-08-04 DIAGNOSIS — Z7901 Long term (current) use of anticoagulants: Secondary | ICD-10-CM

## 2012-08-07 ENCOUNTER — Emergency Department (HOSPITAL_COMMUNITY): Payer: Medicare Other

## 2012-08-07 ENCOUNTER — Encounter (HOSPITAL_COMMUNITY): Payer: Self-pay | Admitting: Emergency Medicine

## 2012-08-07 ENCOUNTER — Inpatient Hospital Stay (HOSPITAL_COMMUNITY)
Admission: EM | Admit: 2012-08-07 | Discharge: 2012-08-11 | DRG: 308 | Disposition: A | Discharge status: Home / Self Care | Payer: Medicare Other | Attending: Cardiology | Admitting: Cardiology

## 2012-08-07 DIAGNOSIS — Z79899 Other long term (current) drug therapy: Secondary | ICD-10-CM

## 2012-08-07 DIAGNOSIS — E876 Hypokalemia: Secondary | ICD-10-CM | POA: Diagnosis not present

## 2012-08-07 DIAGNOSIS — Z8249 Family history of ischemic heart disease and other diseases of the circulatory system: Secondary | ICD-10-CM

## 2012-08-07 DIAGNOSIS — I4891 Unspecified atrial fibrillation: Principal | ICD-10-CM | POA: Diagnosis present

## 2012-08-07 DIAGNOSIS — Z91199 Patient's noncompliance with other medical treatment and regimen due to unspecified reason: Secondary | ICD-10-CM

## 2012-08-07 DIAGNOSIS — Z602 Problems related to living alone: Secondary | ICD-10-CM

## 2012-08-07 DIAGNOSIS — Z9119 Patient's noncompliance with other medical treatment and regimen: Secondary | ICD-10-CM

## 2012-08-07 DIAGNOSIS — I129 Hypertensive chronic kidney disease with stage 1 through stage 4 chronic kidney disease, or unspecified chronic kidney disease: Secondary | ICD-10-CM | POA: Diagnosis present

## 2012-08-07 DIAGNOSIS — Z95810 Presence of automatic (implantable) cardiac defibrillator: Secondary | ICD-10-CM | POA: Diagnosis present

## 2012-08-07 DIAGNOSIS — E669 Obesity, unspecified: Secondary | ICD-10-CM | POA: Diagnosis present

## 2012-08-07 DIAGNOSIS — I4510 Unspecified right bundle-branch block: Secondary | ICD-10-CM | POA: Diagnosis present

## 2012-08-07 DIAGNOSIS — I428 Other cardiomyopathies: Secondary | ICD-10-CM | POA: Diagnosis present

## 2012-08-07 DIAGNOSIS — Z7901 Long term (current) use of anticoagulants: Secondary | ICD-10-CM

## 2012-08-07 DIAGNOSIS — I69998 Other sequelae following unspecified cerebrovascular disease: Secondary | ICD-10-CM

## 2012-08-07 DIAGNOSIS — R29898 Other symptoms and signs involving the musculoskeletal system: Secondary | ICD-10-CM | POA: Diagnosis present

## 2012-08-07 DIAGNOSIS — I5023 Acute on chronic systolic (congestive) heart failure: Secondary | ICD-10-CM | POA: Diagnosis present

## 2012-08-07 DIAGNOSIS — N183 Chronic kidney disease, stage 3 unspecified: Secondary | ICD-10-CM | POA: Diagnosis present

## 2012-08-07 DIAGNOSIS — I509 Heart failure, unspecified: Secondary | ICD-10-CM | POA: Diagnosis present

## 2012-08-07 DIAGNOSIS — I5021 Acute systolic (congestive) heart failure: Secondary | ICD-10-CM

## 2012-08-07 DIAGNOSIS — Z6841 Body Mass Index (BMI) 40.0 and over, adult: Secondary | ICD-10-CM

## 2012-08-07 DIAGNOSIS — E039 Hypothyroidism, unspecified: Secondary | ICD-10-CM | POA: Diagnosis present

## 2012-08-07 DIAGNOSIS — R480 Dyslexia and alexia: Secondary | ICD-10-CM | POA: Diagnosis present

## 2012-08-07 LAB — PROTIME-INR
INR: 2.4 — ABNORMAL HIGH (ref 0.00–1.49)
Prothrombin Time: 25.4 seconds — ABNORMAL HIGH (ref 11.6–15.2)

## 2012-08-07 LAB — CBC
HCT: 40.8 % (ref 39.0–52.0)
MCH: 28.5 pg (ref 26.0–34.0)
MCHC: 32.4 g/dL (ref 30.0–36.0)
MCV: 88.1 fL (ref 78.0–100.0)
RDW: 15.4 % (ref 11.5–15.5)

## 2012-08-07 LAB — BASIC METABOLIC PANEL
BUN: 20 mg/dL (ref 6–23)
Creatinine, Ser: 1.37 mg/dL — ABNORMAL HIGH (ref 0.50–1.35)
GFR calc Af Amer: 69 mL/min — ABNORMAL LOW (ref >90)
GFR calc non Af Amer: 59 mL/min — ABNORMAL LOW (ref >90)

## 2012-08-07 LAB — TROPONIN I: Troponin I: <0.3 ng/mL (ref <0.30)

## 2012-08-07 MED ORDER — METOPROLOL TARTRATE 50 MG PO TABS
50.0000 mg | ORAL_TABLET | Freq: Two times a day (BID) | ORAL | Status: DC
  Administered 2012-08-07 – 2012-08-08 (×3): 50 mg via ORAL
  Filled 2012-08-07 (×5): qty 1

## 2012-08-07 MED ORDER — WARFARIN - PHARMACIST DOSING INPATIENT: Freq: Every day | Status: DC

## 2012-08-07 MED ORDER — ACETAMINOPHEN 325 MG PO TABS: 650.0000 mg | ORAL_TABLET | ORAL | Status: DC | PRN

## 2012-08-07 MED ORDER — FUROSEMIDE 10 MG/ML IJ SOLN
80.0000 mg | Freq: Two times a day (BID) | INTRAMUSCULAR | Status: DC
  Administered 2012-08-08 – 2012-08-09 (×4): 80 mg via INTRAVENOUS
  Filled 2012-08-07 (×6): qty 8

## 2012-08-07 MED ORDER — ALPRAZOLAM 0.25 MG PO TABS: 0.2500 mg | ORAL_TABLET | Freq: Every evening | ORAL | Status: DC | PRN

## 2012-08-07 MED ORDER — SODIUM CHLORIDE 0.9 % IJ SOLN: 3.0000 mL | INTRAMUSCULAR | Status: DC | PRN

## 2012-08-07 MED ORDER — POTASSIUM CHLORIDE CRYS ER 20 MEQ PO TBCR
40.0000 meq | EXTENDED_RELEASE_TABLET | Freq: Every day | ORAL | Status: DC
  Administered 2012-08-08: 40 meq via ORAL
  Filled 2012-08-07 (×2): qty 2

## 2012-08-07 MED ORDER — DIGOXIN 0.0625 MG HALF TABLET
0.0625 mg | ORAL_TABLET | Freq: Every day | ORAL | Status: DC
  Administered 2012-08-07 – 2012-08-10 (×5): 0.0625 mg via ORAL
  Filled 2012-08-07 (×7): qty 1

## 2012-08-07 MED ORDER — POTASSIUM CHLORIDE CRYS ER 20 MEQ PO TBCR
40.0000 meq | EXTENDED_RELEASE_TABLET | Freq: Once | ORAL | Status: AC
  Administered 2012-08-07: 40 meq via ORAL
  Filled 2012-08-07: qty 2

## 2012-08-07 MED ORDER — METOLAZONE 2.5 MG PO TABS
2.5000 mg | ORAL_TABLET | ORAL | Status: DC
  Filled 2012-08-07 (×2): qty 1

## 2012-08-07 MED ORDER — LOSARTAN POTASSIUM 50 MG PO TABS
50.0000 mg | ORAL_TABLET | Freq: Every day | ORAL | Status: DC
  Administered 2012-08-08 – 2012-08-11 (×4): 50 mg via ORAL
  Filled 2012-08-07 (×5): qty 1

## 2012-08-07 MED ORDER — WARFARIN SODIUM 7.5 MG PO TABS
7.5000 mg | ORAL_TABLET | Freq: Once | ORAL | Status: AC
  Administered 2012-08-07: 7.5 mg via ORAL
  Filled 2012-08-07: qty 1

## 2012-08-07 MED ORDER — ONDANSETRON HCL 4 MG/2ML IJ SOLN: 4.0000 mg | Freq: Four times a day (QID) | INTRAMUSCULAR | Status: DC | PRN

## 2012-08-07 MED ORDER — SPIRONOLACTONE 25 MG PO TABS
25.0000 mg | ORAL_TABLET | Freq: Every day | ORAL | Status: DC
  Administered 2012-08-08 – 2012-08-11 (×4): 25 mg via ORAL
  Filled 2012-08-07 (×4): qty 1

## 2012-08-07 MED ORDER — SODIUM CHLORIDE 0.9 % IJ SOLN
3.0000 mL | Freq: Two times a day (BID) | INTRAMUSCULAR | Status: DC
  Administered 2012-08-07 – 2012-08-11 (×8): 3 mL via INTRAVENOUS

## 2012-08-07 MED ORDER — FUROSEMIDE 10 MG/ML IJ SOLN
40.0000 mg | Freq: Once | INTRAMUSCULAR | Status: AC
  Administered 2012-08-07: 40 mg via INTRAVENOUS
  Filled 2012-08-07: qty 4

## 2012-08-07 MED ORDER — LEVOTHYROXINE SODIUM 200 MCG PO TABS
400.0000 ug | ORAL_TABLET | Freq: Every day | ORAL | Status: DC
  Administered 2012-08-08 – 2012-08-11 (×4): 400 ug via ORAL
  Filled 2012-08-07 (×6): qty 2

## 2012-08-07 MED ORDER — AMIODARONE HCL 200 MG PO TABS
200.0000 mg | ORAL_TABLET | Freq: Two times a day (BID) | ORAL | Status: DC
  Administered 2012-08-07 – 2012-08-11 (×8): 200 mg via ORAL
  Filled 2012-08-07 (×9): qty 1

## 2012-08-07 MED ORDER — SODIUM CHLORIDE 0.9 % IV SOLN
250.0000 mL | INTRAVENOUS | Status: DC | PRN
  Administered 2012-08-10: 14:00:00 via INTRAVENOUS

## 2012-08-07 MED ORDER — ACYCLOVIR 800 MG PO TABS
800.0000 mg | ORAL_TABLET | Freq: Every day | ORAL | Status: DC
  Administered 2012-08-08 – 2012-08-11 (×4): 800 mg via ORAL
  Filled 2012-08-07 (×4): qty 1

## 2012-08-07 NOTE — Progress Notes (Signed)
Informed Dr. Myrtis Ser that patient is having pacer spikes in the middle of his QRS complex and at the end of some QRS complexes.  Patient asymptomatic.  Will continue to monitor.

## 2012-08-07 NOTE — H&P (Signed)
History and Physical   Patient ID: Raymond [name] MRN: [id number], DOB/AGE: 02/11/1960 52 y.o. Date of Encounter: 08/07/2012  Primary Physician: Dow Adolph, MD Primary Cardiologist: Dr. Antoine Poche  Chief Complaint: Worsening SOB  HPI:   [name] is a 52 y.o. 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 He says the SOB has been present for a while but has gotten progressively worse over the past 3 days. He says it is present at rest but more so with exertion. He can only walk a couple of steps without being SOB. He describes some associated fatigue, lightheadedness, and diaphoresis as well. He denies any dizziness or LOC. He also describes 3 pillow orthopnea and PND. He has been having trouble sleeping recently d/t complaints of PND. He also describes a dry cough in the last couple days as well, not purely associated with his SOB as well as some intermittent chest pain. The chest pain has no known provocation and seems to go away on its own. Otherwise the patient has no other complaints. He denies nausea, vomiting, fever, chills, or abdominal pain.  He was admitted on 06/21/12 due to A-fib w/ RVR as well as an episode of acute on chronic HF at that time. He was placed on IV heparin and Lasix during his admission. He had a TEE for possible cardioversion, but it showed a questionable clot in the LAA. No cardioversion was performed and he was sent home on Coumadin, and Metolazone was added to his medical regimen.   It was documented that the patient's INR was therapeutic for 4 weeks.  On 07/27/12 the patient returned as an outpatient and was successfully cardioverted by Dr. Elease Hashimoto. He says that he felt better after the cardioversion.  Past Medical History    Diagnosis Date  . Systolic CHF, chronic   . Non-ischemic cardiomyopathy     a. echo 11/10: EF 15%;   b. cath 10/08: normal cors;  c. Echo 8/12: EF 20-25%;  d. Myoview 5/12: Very mild distal ant and apical isch, EF 17% (low risk-medical Rx cont'd);  e.  Echo 4/14:  EF 15%, diff HK, severe diast dysfn, restrictive physiology, E/medial e' > 15 suggests LVEDP at least 20 mmHg, borderline dilated Ao root, mild MR, mod LAE, mod reduced RVSF, mod TR, PASP 64  . Atrial fibrillation     ON COUMADIN  . Hypertension   . Hypothyroidism     s/p RAI therapy  . Obesity   . Non-compliance   . Dyslexia   . 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 RBBB (right bundle branch block)   . Palpitations     W/WIDE COMPLEX TACHYCARDIA IN THE PAST, REFUSED REPEATED OFFERS OF AN ICD  . Biventricular ICD  st Judes]   . Shortness of breath   . Stroke 11/2009    Surgical History:  Past Surgical History  Procedure Laterality Date  . Insert / replace / remove pacemaker  06/11/09    [employer] [id number] BIVENTRICULAR AICD SERIAL [phone]  . Knee surgery Left   . Cardiac catheterization    . Tee without cardioversion N/A 06/23/2012    Procedure: TRANSESOPHAGEAL ECHOCARDIOGRAM (TEE);  Surgeon: Deloris Ping  Nahser, MD;  Location: [facility] ENDOSCOPY;  Service: Cardiovascular;  Laterality: N/A;  TEE will be done at 0730 Cardioversion is scheduled at 1100-1200 Mary/ebp  . Cardioversion N/A 07/27/2012    Procedure: CARDIOVERSION;  Surgeon: Vesta Mixer, MD;  Location: [facility] ENDOSCOPY;  Service: Cardiovascular;  Laterality: N/A;    I have reviewed the patient's current medications. Prior to Admission medications   Medication Sig Start Date End Date Taking? Authorizing Provider  acyclovir (ZOVIRAX) 800 MG tablet Take 800 mg by mouth daily. 11/09/11  Yes Ryan M Dunn, PA-C  ALPRAZolam Prudy Feeler) 0.25 MG tablet Take 0.25 mg by mouth at bedtime as needed for  sleep.   Yes Historical Provider, MD  amiodarone (PACERONE) 200 MG tablet Take 1 tablet (200 mg total) by mouth 2 (two) times daily. 06/25/12  Yes Scott Moishe Spice, PA-C  digoxin (LANOXIN) 0.125 MG tablet Take 0.0625 mg by mouth at bedtime.    Yes Historical Provider, MD  furosemide (LASIX) 80 MG tablet Take 80 mg by mouth 2 (two) times daily. 05/30/12  Yes [name], MD  levothyroxine (SYNTHROID, LEVOTHROID) 200 MCG tablet Take 400 mcg by mouth daily before breakfast.   Yes Historical Provider, MD  losartan (COZAAR) 50 MG tablet Take 1 tablet (50 mg total) by mouth daily. 01/03/12  Yes [name], MD  metolazone (ZAROXOLYN) 2.5 MG tablet Take 1 tablet (2.5 mg total) by mouth 3 (three) times a week. Take 30 minutes before Lasix on Mondays, WED AND FRI'S 07/04/12  Yes Scott T Alben Spittle, PA-C  metoprolol (LOPRESSOR) 50 MG tablet Take 1 tablet (50 mg total) by mouth 2 (two) times daily. 07/26/12  Yes Scott T Alben Spittle, PA-C  potassium chloride SA (K-DUR,KLOR-CON) 20 MEQ tablet Take 1 tablet (20 mEq total) by mouth 3 (three) times a week. Take on Mondays, WED, AND FRI'S  with Metolazone. 07/04/12  Yes Scott Moishe Spice, PA-C  spironolactone (ALDACTONE) 50 MG tablet Take 25 mg by mouth daily.   Yes Historical Provider, MD  warfarin (COUMADIN) 7.5 MG tablet Take 7.5-11.25 mg by mouth daily. Takes 7.5mg  every day except for 11.25mg  on Friday   Yes Historical Provider, MD   Scheduled Meds: Continuous Infusions: PRN Meds:.   Allergies:  Allergies  Allergen Reactions  . Losartan Cough  . Valsartan Cough    History   Social History  . Marital Status: Legally Separated    Spouse Name: N/A    Number of Children: 0  . Years of Education: N/A   Occupational History  . DISABLED     HOWEVER, HE DOES WORK A LITTLE ON CARS   Social History Main Topics  . Smoking status: Never Smoker   . Smokeless tobacco: Never Used  . Alcohol Use: No  . Drug Use: No  . Sexually Active: Not on file   Other Topics  Concern  . Not on file   Social History Narrative   LIVES ALONE IN [place], [place]   DISABLED HOWEVER, DOES WORK A LITTLE ON CARS   NO ETOH   NO TOBACCO   NO DRUG ABUSE    Family History  Problem Relation Age of Onset  . Heart disease Father    Family Status  Relation Status Death Age  . Father Deceased 46    BIOLOGICAL FATHER  . Mother Alive    Review of Systems: General: Positive for diaphoresis associated w/ episodic SOB. Denies fever, chills, appetite change and fatigue.  HEENT: Denies change in vision, eye pain, redness, hearing loss,  congestion, sore throat, rhinorrhea, sneezing, mouth sores, trouble swallowing, neck pain, neck stiffness and tinnitus.   Respiratory: Complains of SOB mostly w/ exertion, but also at rest. Also complains of a dry cough, orhtopnea, and PND. Denies wheezing.  Cardiovascular: Positive for intermittent chest pain. Denies palpitations and leg swelling.  Gastrointestinal: Denies nausea, vomiting, abdominal pain, diarrhea, constipation, blood in stool and abdominal distention.  Genitourinary: Denies dysuria, urgency, frequency, hematuria, flank pain and difficulty urinating.  Endocrine: Denies hot or cold intolerance, sweats, polyuria, polydipsia. Musculoskeletal: Denies myalgias, back pain, joint swelling, arthralgias and gait problem.  Skin: Denies pallor, rash and wounds.  Neurological: Positive for lightheadedness associated w/ SOB. Also has left sided residual weakness, s/p stroke. Denies dizziness, seizures, syncope, numbness and headaches.  Hematological: Denies adenopathy,easy bruising, personal or family bleeding history.  Psychiatric/Behavioral: Positive for sleep disturbance 2/2 breathing issues. Denies mood changes, confusion, nervousness, sleep disturbance and agitation.  Physical Exam: Blood pressure 91/73, pulse 99, temperature 98 F (36.7 C), temperature source Oral, resp. rate 26, SpO2 91.00%. General: Vital signs reviewed.  Patient is a  well-developed and well-nourished, in mild respiratory distress. Cooperative with exam. Alert and oriented x3.  Head: Normocephalic and atraumatic. Nose: No erythema or drainage noted.  Turbinates normal. Mouth: No erythema, exudates, sores, or ulcerations. Moist mucus membranes. Eyes: PERRL, EOMI, conjunctivae normal, No scleral icterus.  Neck: Supple, trachea midline, normal ROM, JVD to jaw. No masses, thyromegaly, or carotid bruit present.  Cardiovascular: RRR, S1 normal, S2 normal, no murmurs, gallops, or rubs. Pulmonary/Chest: Normal respiratory effort, CTAB. Mild rales in lower lung fields. Abdominal: Soft. Non-tender, non-distended, bowel sounds are normal, no masses, organomegaly, or guarding present.  Musculoskeletal: No joint deformities, erythema, or stiffness, ROM full and no nontender. Extremities: mild ptting edema in the LE's.  Pulses symmetric and intact bilaterally. No cyanosis or clubbing. Neurological: A&O x3, Strength is diminished in L. upper and lower extremities (s/p stroke w/ residual weakness), cranial nerve II-XII are grossly intact, sensory intact to light touch bilaterally.  Skin: Warm, dry and intact. No rashes or erythema. Psychiatric: Normal mood and affect. speech and behavior is normal. Cognition and memory are normal.   Labs:   Lab Results  Component Value Date   WBC 7.4 08/07/2012   HGB 13.2 08/07/2012   HCT 40.8 08/07/2012   MCV 88.1 08/07/2012   PLT 288 08/07/2012   No results found for this basename: INR,  in the last 72 hours  Recent Labs Lab 08/07/12 1135  NA 135  K 3.2*  CL 98  CO2 24  BUN 20  CREATININE 1.37*  CALCIUM 9.0  GLUCOSE 130*   No results found for this basename: CKTOTAL, CKMB, TROPONINI,  in the last 72 hours  Recent Labs  08/07/12 1216  TROPIPOC 0.07   Lab Results  Component Value Date   CHOL  Value: 135        ATP III CLASSIFICATION:  <200     mg/dL   Desirable  409-811  mg/dL   Borderline High  >=914    mg/dL   High         78/02/9560   HDL 32* 11/19/2008   LDLCALC  Value: 79        Total Cholesterol/HDL:CHD Risk Coronary Heart Disease Risk Table                     Men   Women  1/2 Average Risk   3.4   3.3  Average  Risk       5.0   4.4  2 X Average Risk   9.6   7.1  3 X Average Risk  23.4   11.0        Use the calculated Patient Ratio above and the CHD Risk Table to determine the patient's CHD Risk.        ATP III CLASSIFICATION (LDL):  <100     mg/dL   Optimal  161-096  mg/dL   Near or Above                    Optimal  130-159  mg/dL   Borderline  045-409  mg/dL   High  >811     mg/dL   Very High 91/04/7827   TRIG 118 11/19/2008   Pro B Natriuretic peptide (BNP)  Date/Time Value Range Status  08/07/2012 11:35 AM 4323.0* 0 - 125 pg/mL Final  07/26/2012  1:47 PM 324.0* 0.0 - 100.0 pg/mL Final   No results found for this basename: DDIMER    Radiology/Studies: Dg Chest 2 View  08/07/2012   *RADIOLOGY REPORT*  Clinical Data: Shortness of breath and weakness for 2 days, light chest pain yesterday  CHEST - 2 VIEW  Comparison: 06/22/2012  Findings: Left subclavian pacemaker / AICD leads project over right atrium, right ventricle and coronary sinus. Enlargement of cardiac silhouette with pulmonary vascular congestion. Mediastinal contours normal. No acute infiltrate, pleural effusion or pneumothorax. Bones unremarkable.  IMPRESSION: Enlargement of cardiac silhouette with pulmonary vascular congestion post pacemaker and AICD. No acute abnormalities.   Original Report Authenticated By: Ulyses Southward, M.D.   Echo: TEE 06/23/12 Left Ventrical: Markedly depressed LV function. EF 10%  Mitral Valve: mild MR  Aortic Valve: normal AV  Tricuspid Valve: mild - mod TR  Pulmonic Valve: no siginificant PI  Left Atrium/ Left atrial appendage: there is extensive spontaneous contrast in the LA and LAA. There is a question of a mass in the LAA that could be a thrombus. It is associated with extensive spontaneous contrast "smoke"  Atrial septum:  grossly normal, bubble study was not done  Aorta: normal  Complications: No apparent complications  Patient did tolerate procedure well.  Cardioversion was not done because of this question of thrombus  05/04/12  Study Conclusions: - Left ventricle: The cavity size was severely dilated. Wall thickness was normal. The estimated ejection fraction was 15%. Diffuse hypokinesis. Doppler parameters are consistent with restrictive physiology, indicative of decreased left ventricular diastolic compliance and/or increased left atrial pressure. E/medial e' > 15 suggests LV end diastolic pressure at least 20 mmHg. - Aortic valve: There was no stenosis. - Aorta: Borderline dilated aortic root. - Mitral valve: Mild regurgitation. - Left atrium: The atrium was moderately dilated. - Right ventricle: Pacer wire or catheter noted in right ventricle. Systolic function was moderately reduced. - Tricuspid valve: Moderate regurgitation. Peak RV-RA gradient: 49mm Hg (S). - Pulmonary arteries: PA peak pressure: 64mm Hg (S). - Systemic veins: IVC measured 2.6 cm with some respirophasic variation, suggesting RA pressure 15 mmHg.  Impressions: - Severely dilated LV with severe global hypokinesis, EF 15%. Severe diastolic dysfunction with evidence for elevated LV filling pressure. Normal RV size with moderate systolic dysfunction. Moderate pulmonary hypertension with dilated IVC suggesting elevated RV filling pressure.  ECG:   I believe that his EKG reveals atrial fibrillation at this time.   ASSESSMENT AND PLAN:  52 y.o. 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   Acute on Chronic HF: His pBNP on 07/26/12 was 324. On  this admission it is 4323.  If the patient's renal function remains stable (Cr is 1.37 down from 1.6 on 07/26/12), continue Lasix 80 IV bid plus metolazone 2.5, 30 minutes before AM Lasix dose.  HTN: The patient's blood pressure is stable at 102/73.  Continued home medications.  Atrial Fibrillation: The patient appears to be back in atrial fibrillation w/ a rate in the 100's.  Most recent INR was 1.2 on 08/04/12. Repeat INR today. If <2, start heparin IV until therapeutic. If therapeutic, continue Coumadin. Continue Amiodarone 200 mg bid, will discuss plan for possible cardioversion or change in medical therapy.  Roxan Diesel, MD 08/07/2012 2:09 PM Patient seen and examined. I agree with the assessment and plan as detailed above. See also my additional thoughts below.  Also, I have written part of the note above.    FIBRILLATION, ATRIAL     It appears that the patient has return of atrial fibrillation. He was just cardioverted to July 27, 2012. Unfortunately he had a recent INR of 1.2. This is concerning after his recent cardioversion. It is concerning in general with his atrial fibrillation. His INR for today is being checked. He will be placed on heparin immediately if his INR is less than 2.0. Decision will have to be made about the approach to his atrial fib. Since his INR has not been fully therapeutic, we cannot proceed with cardioversion at this time. He is already on a relatively high dose of amiodarone. I do not know if a higher dose can be considered. Also, I do not know if other medications could be considered. In addition, I wonder if AV node ablation is a reasonable consideration so that he can buy ventricularly paced all the time.    Biventricular implantable cardioverter-defibrillator in situ            Encounter for long-term (current) use of anticoagulants      Several lines above I have discussed the current status of his anticoagulation.    Non-ischemic cardiomyopathy      He is on appropriate medications.    Acute on chronic systolic CHF (congestive heart failure)    The patient is once again volume overloaded. His symptom is not significant edema or abdominal bloating at this time. His symptom is orthopnea and PND and a persistent cough. He is already begun to diuresis with IV Lasix. I do not know if we will be able to stabilize him completely without return to sinus rhythm. Consideration could be given to changing his Lasix to Demadex to see if he absorbs better.  Willa Rough, MD, [facility] 08/07/2012 5:02 PM

## 2012-08-07 NOTE — ED Notes (Signed)
Pt c/o increased SOB x weeks worse when laying flat; pt sts hx of CHF; pt denies leg swelling

## 2012-08-07 NOTE — ED Provider Notes (Signed)
Medical screening examination/treatment/procedure(s) were performed by non-physician practitioner and as supervising physician I was immediately available for consultation/collaboration.  Geoffery Lyons, MD 08/07/12 1258

## 2012-08-07 NOTE — Progress Notes (Signed)
ANTICOAGULATION CONSULT NOTE - Initial Consult  Pharmacy Consult for Coumadin Indication: atrial fibrillation  Allergies  Allergen Reactions  . Losartan Cough  . Valsartan Cough    Patient Measurements: Height: 5\' 9"  (175.3 cm) Weight: 278 lb 7.1 oz (126.3 kg) (scale a) IBW/kg (Calculated) : 70.7   Vital Signs: Temp: 97.8 F (36.6 C) (07/21 1815) Temp src: Oral (07/21 1815) BP: 100/59 mmHg (07/21 1815) Pulse Rate: 90 (07/21 1815)  Labs:  Recent Labs  08/07/12 1135 08/07/12 1822  HGB 13.2  --   HCT 40.8  --   PLT 288  --   LABPROT  --  25.4*  INR  --  2.40*  CREATININE 1.37*  --     Estimated Creatinine Clearance: 85.7 ml/min (by C-G formula based on Cr of 1.37).   Medical History: Past Medical History  Diagnosis Date  . Systolic CHF, chronic   . Non-ischemic cardiomyopathy     a. echo 11/10: EF 15%;   b. cath 10/08: normal cors;  c. Echo 8/12: EF 20-25%;  d. Myoview 5/12: Very mild distal ant and apical isch, EF 17% (low risk-medical Rx cont'd);  e.  Echo 4/14:  EF 15%, diff HK, severe diast dysfn, restrictive physiology, E/medial e' > 15 suggests LVEDP at least 20 mmHg, borderline dilated Ao root, mild MR, mod LAE, mod reduced RVSF, mod TR, PASP 64  . Atrial fibrillation     ON COUMADIN  . Hypertension   . Hypothyroidism     s/p RAI therapy  . Obesity   . Non-compliance   . Dyslexia   . 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 RBBB (right bundle branch block)   . Palpitations     W/WIDE COMPLEX TACHYCARDIA IN THE PAST, REFUSED REPEATED OFFERS OF AN ICD  . Biventricular ICD  st Judes]   . Shortness of breath   . Stroke 11/2009    Assessment: 52 year old male on Coumadin PTA for Afib.  Admit INR therapeutic at 2.40. Dose PTA = 7.5 mg daily except for 11.25 mg on Fridays  Goal of Therapy:  INR 2-3 Monitor platelets by anticoagulation protocol: Yes   Plan:  1) Coumadin 7.5 mg po x  1 tonight 2) Daily INR  Thank you. Okey Regal, PharmD [phone]  08/07/2012,7:03 PM

## 2012-08-07 NOTE — ED Notes (Signed)
Meal tray ordered for pt  

## 2012-08-07 NOTE — ED Provider Notes (Signed)
History    CSN: [id number] Arrival date & time 08/07/12  1047  First MD Initiated Contact with Patient 08/07/12 1053     Chief Complaint  Patient presents with  . Shortness of Breath   (Consider location/radiation/quality/duration/timing/severity/associated sxs/prior Treatment) HPI Comments: 52 y/o male with an extensive past medical history including CHF, A. fib, hypertension and CVA presents to the emergency department with his girlfriend complaining of shortness of breath over the past few days worsening at 2:00 am this morning. SOB worse when laying flat and on exertion. when girlfriend got to his house around 3:00 he appeared "hot and sweaty". Admits to associated intermittent chest pain beginning on the ride to the ED located mid-sternal, non-radiating. Nothing in specific made the pain come or go, unsure how long it lasted for. Admits to associated dry cough beginning this morning. Denies peripheral edema, fever, chills, nausea, vomiting, urinary changes. Last saw [facility] cardiology on July 10 for cardioversion.   Patient is a 52 y.o. male presenting with shortness of breath. The history is provided by the patient and a significant other.  Shortness of Breath Associated symptoms: cough and diaphoresis   Associated symptoms: no fever, no headaches and no vomiting    Past Medical History  Diagnosis Date  . Systolic CHF, chronic   . Non-ischemic cardiomyopathy     a. echo 11/10: EF 15%;   b. cath 10/08: normal cors;  c. Echo 8/12: EF 20-25%;  d. Myoview 5/12: Very mild distal ant and apical isch, EF 17% (low risk-medical Rx cont'd);  e.  Echo 4/14:  EF 15%, diff HK, severe diast dysfn, restrictive physiology, E/medial e' > 15 suggests LVEDP at least 20 mmHg, borderline dilated Ao root, mild MR, mod LAE, mod reduced RVSF, mod TR, PASP 64  . Atrial fibrillation     ON COUMADIN  . Hypertension   . Hypothyroidism     s/p RAI therapy  . Obesity   . Non-compliance   . Dyslexia   .  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 RBBB (right bundle branch block)   . Palpitations     W/WIDE COMPLEX TACHYCARDIA IN THE PAST, REFUSED REPEATED OFFERS OF AN ICD  . Biventricular ICD  st Judes]   . Shortness of breath   . Stroke 11/2009   Past Surgical History  Procedure Laterality Date  . Insert / replace / remove pacemaker  06/11/09    [employer] [id number] BIVENTRICULAR AICD SERIAL [phone]  . Knee surgery Left   . Cardiac catheterization    . Tee without cardioversion N/A 06/23/2012    Procedure: TRANSESOPHAGEAL ECHOCARDIOGRAM (TEE);  Surgeon: Vesta Mixer, MD;  Location: [facility] ENDOSCOPY;  Service: Cardiovascular;  Laterality: N/A;  TEE will be done at 0730 Cardioversion is scheduled at 1100-1200 Mary/ebp  . Cardioversion N/A 07/27/2012    Procedure: CARDIOVERSION;  Surgeon: Vesta Mixer, MD;  Location: [facility] ENDOSCOPY;  Service: Cardiovascular;  Laterality: N/A;   Family History  Problem Relation Age of Onset  . Heart disease Father    History  Substance Use Topics  . Smoking status: Never Smoker   . Smokeless tobacco: Never Used  . Alcohol Use: No    Review of Systems  Constitutional: Positive for diaphoresis. Negative for fever and chills.  Respiratory: Positive for cough and shortness of breath.   Gastrointestinal: Negative for nausea, vomiting and abdominal distention.  Genitourinary:  Negative for frequency and decreased urine volume.  Neurological: Negative for dizziness, light-headedness and headaches.  Psychiatric/Behavioral: Negative for confusion.  All other systems reviewed and are negative.    Allergies  Losartan and Valsartan  Home Medications   Current Outpatient Rx  Name  Route  Sig  Dispense  Refill  . acyclovir (ZOVIRAX) 800 MG tablet   Oral   Take 800 mg by mouth daily.         Marland Kitchen ALPRAZolam (XANAX) 0.25 MG tablet   Oral   Take 0.25 mg by mouth at bedtime as  needed for sleep.         Marland Kitchen amiodarone (PACERONE) 200 MG tablet   Oral   Take 1 tablet (200 mg total) by mouth 2 (two) times daily.   60 tablet   5   . digoxin (LANOXIN) 0.125 MG tablet   Oral   Take 0.0625 mg by mouth at bedtime.          . furosemide (LASIX) 80 MG tablet   Oral   Take 80 mg by mouth 2 (two) times daily.         Marland Kitchen levothyroxine (SYNTHROID, LEVOTHROID) 200 MCG tablet   Oral   Take 400 mcg by mouth daily before breakfast.         . losartan (COZAAR) 50 MG tablet   Oral   Take 1 tablet (50 mg total) by mouth daily.   1 tablet   0   . metolazone (ZAROXOLYN) 2.5 MG tablet   Oral   Take 1 tablet (2.5 mg total) by mouth 3 (three) times a week. Take 30 minutes before Lasix on Mondays, WED AND FRI'S   15 tablet   5   . metoprolol (LOPRESSOR) 50 MG tablet   Oral   Take 1 tablet (50 mg total) by mouth 2 (two) times daily.   60 tablet   6   . potassium chloride SA (K-DUR,KLOR-CON) 20 MEQ tablet   Oral   Take 1 tablet (20 mEq total) by mouth 3 (three) times a week. Take on Mondays, WED, AND FRI'S  with Metolazone.   15 tablet   5   . spironolactone (ALDACTONE) 50 MG tablet   Oral   Take 25 mg by mouth daily.         Marland Kitchen warfarin (COUMADIN) 7.5 MG tablet   Oral   Take 7.5-11.25 mg by mouth daily. Takes 7.5mg  every day except for 11.25mg  on Friday          BP 100/71  Pulse 72  Temp(Src) 98 F (36.7 C) (Oral)  Resp 18  SpO2 97% Physical Exam  Nursing note and vitals reviewed. Constitutional: He is oriented to person, place, and time. He appears well-developed. No distress.  Overweight  HENT:  Head: Normocephalic and atraumatic.  Mouth/Throat: Oropharynx is clear and moist.  Eyes: Conjunctivae and EOM are normal. Pupils are equal, round, and reactive to light.  Neck: Normal range of motion. Neck supple. No JVD present. No tracheal deviation present.  Cardiovascular: Normal rate, regular rhythm, normal heart sounds and intact distal  pulses.   No extremity edema.  Pulmonary/Chest: Effort normal. No respiratory distress. He has rales (scattered).  Abdominal: Soft. Bowel sounds are normal. He exhibits no distension. There is no tenderness.  Musculoskeletal: Normal range of motion. He exhibits no edema.  Neurological: He is alert and oriented to person, place, and time.  Skin: Skin is warm and dry. He is not diaphoretic.  Psychiatric:  He has a normal mood and affect. His behavior is normal.    ED Course  Procedures (including critical care time) Labs Reviewed  BASIC METABOLIC PANEL - Abnormal; Notable for the following:    Potassium 3.2 (*)    Glucose, Bld 130 (*)    Creatinine, Ser 1.37 (*)    GFR calc non Af Amer 59 (*)    GFR calc Af Amer 69 (*)    All other components within normal limits  PRO B NATRIURETIC PEPTIDE - Abnormal; Notable for the following:    Pro B Natriuretic peptide (BNP) 4323.0 (*)    All other components within normal limits  CBC  POCT I-STAT TROPONIN I    Date: 08/07/2012  Rate: 103  Rhythm: atrial fibrillation  QRS Axis: right  Intervals: QT prolonged  ST/T Wave abnormalities: normal  Conduction Disutrbances:biventricular pacemaker  Narrative Interpretation: no stemi  Old EKG Reviewed: unchanged   Dg Chest 2 View  08/07/2012   *RADIOLOGY REPORT*  Clinical Data: Shortness of breath and weakness for 2 days, light chest pain yesterday  CHEST - 2 VIEW  Comparison: 06/22/2012  Findings: Left subclavian pacemaker / AICD leads project over right atrium, right ventricle and coronary sinus. Enlargement of cardiac silhouette with pulmonary vascular congestion. Mediastinal contours normal. No acute infiltrate, pleural effusion or pneumothorax. Bones unremarkable.  IMPRESSION: Enlargement of cardiac silhouette with pulmonary vascular congestion post pacemaker and AICD. No acute abnormalities.   Original Report Authenticated By: Ulyses Southward, M.D.   1. CHF (congestive heart failure), acute, systolic      MDM  Patient with sob, in acute CHF. EKG in afib, recent cardioversion 2 weeks back. He is in NAD, normal vital signs. Patient will be admitted to cardiology. Admission accepted by [facility] cardiology. Patient discussed with Dr. Judd Lien who agrees with plan of care.        Trevor Mace, PA-C 08/07/12 1244

## 2012-08-07 NOTE — ED Notes (Signed)
Cardiology at bedside to see pt.  Pt sts nothing needed at this time.

## 2012-08-07 NOTE — ED Notes (Signed)
Report to Whitney, rn 

## 2012-08-08 DIAGNOSIS — I5021 Acute systolic (congestive) heart failure: Secondary | ICD-10-CM

## 2012-08-08 LAB — PROTIME-INR
INR: 2.68 — ABNORMAL HIGH (ref 0.00–1.49)
Prothrombin Time: 27.6 seconds — ABNORMAL HIGH (ref 11.6–15.2)

## 2012-08-08 LAB — BASIC METABOLIC PANEL
BUN: 24 mg/dL — ABNORMAL HIGH (ref 6–23)
Calcium: 8.9 mg/dL (ref 8.4–10.5)
Chloride: 98 mEq/L (ref 96–112)
Creatinine, Ser: 1.74 mg/dL — ABNORMAL HIGH (ref 0.50–1.35)
GFR calc Af Amer: 51 mL/min — ABNORMAL LOW (ref >90)

## 2012-08-08 MED ORDER — WARFARIN SODIUM 7.5 MG PO TABS
7.5000 mg | ORAL_TABLET | Freq: Once | ORAL | Status: AC
  Administered 2012-08-08: 7.5 mg via ORAL
  Filled 2012-08-08: qty 1

## 2012-08-08 MED ORDER — METOLAZONE 5 MG PO TABS
5.0000 mg | ORAL_TABLET | ORAL | Status: DC
  Administered 2012-08-09: 5 mg via ORAL
  Filled 2012-08-08 (×3): qty 1

## 2012-08-08 MED ORDER — GUAIFENESIN-DM 100-10 MG/5ML PO SYRP
5.0000 mL | ORAL_SOLUTION | ORAL | Status: DC | PRN
  Administered 2012-08-08 – 2012-08-11 (×12): 5 mL via ORAL
  Filled 2012-08-08 (×12): qty 5

## 2012-08-08 MED ORDER — POTASSIUM CHLORIDE CRYS ER 20 MEQ PO TBCR
40.0000 meq | EXTENDED_RELEASE_TABLET | Freq: Two times a day (BID) | ORAL | Status: DC
  Administered 2012-08-08 – 2012-08-11 (×6): 40 meq via ORAL
  Filled 2012-08-08 (×7): qty 2

## 2012-08-08 NOTE — Progress Notes (Addendum)
Patient ID: ISMAEL KARGE, male   DOB: 03/21/1960, 52 y.o.   MRN: [id number]   Patient Name: Raymond [name] Date of Encounter: 08/08/2012    SUBJECTIVE  Multiple complaints about hospital. Wants to go home. PAR with RVR in 110 range sitting in chair.  CURRENT MEDS . acyclovir  800 mg Oral Daily  . amiodarone  200 mg Oral BID  . digoxin  0.0625 mg Oral QHS  . furosemide  80 mg Intravenous BID  . levothyroxine  400 mcg Oral QAC breakfast  . losartan  50 mg Oral Daily  . metolazone  2.5 mg Oral 3 times weekly  . metoprolol  50 mg Oral BID  . potassium chloride SA  40 mEq Oral Daily  . sodium chloride  3 mL Intravenous Q12H  . spironolactone  25 mg Oral Daily  . Warfarin - Pharmacist Dosing Inpatient   Does not apply q1800    OBJECTIVE  Filed Vitals:   08/07/12 2100 08/07/12 2208 08/07/12 2209 08/08/12 0443  BP: 103/57 102/63  99/63  Pulse: 94  118 52  Temp:    97.3 F (36.3 C)  TempSrc:    Oral  Resp: 22   20  Height:      Weight:    278 lb 7.1 oz (126.3 kg)  SpO2: 100%   100%    Intake/Output Summary (Last 24 hours) at 08/08/12 1013 Last data filed at 08/08/12 0447  Gross per 24 hour  Intake    403 ml  Output    150 ml  Net    253 ml   Filed Weights   08/07/12 1815 08/08/12 0443  Weight: 278 lb 7.1 oz (126.3 kg) 278 lb 7.1 oz (126.3 kg)    PHYSICAL EXAM  General: Pleasant, NAD. Obese Neuro: Alert and oriented X 3. Moves all extremities spontaneously. Psych: Normal affect. HEENT:  Normal  Neck: Supple without bruits or JVD. Lungs:  Resp regular and unlabored, CTA. Heart: IRR no s3, s4, or murmurs. Abdomen: Soft, non-tender, non-distended, BS + x 4.  Extremities: No clubbing, cyanosis or edema. DP/PT/Radials 2+ and equal bilaterally.  Accessory Clinical Findings  CBC  Recent Labs  08/07/12 1135  WBC 7.4  HGB 13.2  HCT 40.8  MCV 88.1  PLT 288   Basic Metabolic Panel  Recent Labs  08/07/12 1135 08/08/12 0545  NA 135 136  K 3.2* 3.7  CL 98  98  CO2 24 25  GLUCOSE 130* 155*  BUN 20 24*  CREATININE 1.37* 1.74*  CALCIUM 9.0 8.9   Liver Function Tests No results found for this basename: AST, ALT, ALKPHOS, BILITOT, PROT, ALBUMIN,  in the last 72 hours No results found for this basename: LIPASE, AMYLASE,  in the last 72 hours Cardiac Enzymes  Recent Labs  08/07/12 1822 08/07/12 2347 08/08/12 0545  TROPONINI <0.30 <0.30 <0.30   BNP No components found with this basename: POCBNP,  D-Dimer No results found for this basename: DDIMER,  in the last 72 hours Hemoglobin A1C No results found for this basename: HGBA1C,  in the last 72 hours Fasting Lipid Panel No results found for this basename: CHOL, HDL, LDLCALC, TRIG, CHOLHDL, LDLDIRECT,  in the last 72 hours Thyroid Function Tests No results found for this basename: TSH, T4TOTAL, FREET3, T3FREE, THYROIDAB,  in the last 72 hours  TELE  PAF with RVR  ECG    Radiology/Studies  Dg Chest 2 View  08/07/2012   *RADIOLOGY REPORT*  Clinical Data: Shortness  of breath and weakness for 2 days, light chest pain yesterday  CHEST - 2 VIEW  Comparison: 06/22/2012  Findings: Left subclavian pacemaker / AICD leads project over right atrium, right ventricle and coronary sinus. Enlargement of cardiac silhouette with pulmonary vascular congestion. Mediastinal contours normal. No acute infiltrate, pleural effusion or pneumothorax. Bones unremarkable.  IMPRESSION: Enlargement of cardiac silhouette with pulmonary vascular congestion post pacemaker and AICD. No acute abnormalities.   Original Report Authenticated By: Ulyses Southward, M.D.    ASSESSMENT AND PLAN  Active Problems:   FIBRILLATION, ATRIAL   Biventricular implantable cardioverter-defibrillator in situ   Encounter for long-term (current) use of anticoagulants   Non-ischemic cardiomyopathy   Acute on chronic systolic CHF (congestive heart failure)   I/o incomplete yesterday. Weight identical. Will need to be in NSR to stay out of  ACSCHF. Compliance also a problem as noted with subtherapeutic INR on admit and caught drinking soft drinks in the room. Long discussion with patient and girlfriend re compliance, daily weights and titrating diuretic.  Will change to demadex per Dr Myrtis Ser suggestion at discharge and increase metolazone to 5mg . Will hold NPO for possible TEE CV in am. Amiodarone at max dose. INR now therapuetic.   Signed, Valera Castle MD

## 2012-08-08 NOTE — Progress Notes (Signed)
ANTICOAGULATION CONSULT NOTE - Follow Up Consult  Pharmacy Consult for Coumadin Indication: atrial fibrillation  Allergies  Allergen Reactions  . Losartan Cough  . Valsartan Cough    Patient Measurements: Height: 5\' 9"  (175.3 cm) Weight: 278 lb 7.1 oz (126.3 kg) (Scale B) IBW/kg (Calculated) : 70.7   Vital Signs: Temp: 97.3 F (36.3 C) (07/22 0443) Temp src: Oral (07/22 0443) BP: 99/63 mmHg (07/22 0443) Pulse Rate: 52 (07/22 0443)  Labs:  Recent Labs  08/07/12 1135 08/07/12 1822 08/07/12 2347 08/08/12 0545  HGB 13.2  --   --   --   HCT 40.8  --   --   --   PLT 288  --   --   --   LABPROT  --  25.4*  --  27.6*  INR  --  2.40*  --  2.68*  CREATININE 1.37*  --   --  1.74*  TROPONINI  --  <0.30 <0.30 <0.30    Estimated Creatinine Clearance: 67.5 ml/min (by C-G formula based on Cr of 1.74).   Medical History: Past Medical History  Diagnosis Date  . Systolic CHF, chronic   . Non-ischemic cardiomyopathy     a. echo 11/10: EF 15%;   b. cath 10/08: normal cors;  c. Echo 8/12: EF 20-25%;  d. Myoview 5/12: Very mild distal ant and apical isch, EF 17% (low risk-medical Rx cont'd);  e.  Echo 4/14:  EF 15%, diff HK, severe diast dysfn, restrictive physiology, E/medial e' > 15 suggests LVEDP at least 20 mmHg, borderline dilated Ao root, mild MR, mod LAE, mod reduced RVSF, mod TR, PASP 64  . Atrial fibrillation     ON COUMADIN  . Hypertension   . Hypothyroidism     s/p RAI therapy  . Obesity   . Non-compliance   . Dyslexia   . 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 RBBB (right bundle branch block)   . Palpitations     W/WIDE COMPLEX TACHYCARDIA IN THE PAST, REFUSED REPEATED OFFERS OF AN ICD  . Biventricular ICD  st Judes]   . Shortness of breath   . Stroke 11/2009    Assessment: 52 year old male on Coumadin PTA for Afib.  INR therapeutic at 2.68 Dose PTA = 7.5 mg daily except for 11.25 mg on  Fridays  Goal of Therapy:  INR 2-3 Monitor platelets by anticoagulation protocol: Yes   Plan:  1) Coumadin 7.5 mg po x 1 tonight 2) Daily INR  Thank you. Talbert Cage, PharmD [phone]  08/08/2012,10:34 AM

## 2012-08-08 NOTE — Progress Notes (Signed)
The patient was seen drinking soft drinks and was educated on the importance of limiting their intake.  He was also educated on restricting his fluids, but stated that "no one can drink 1 L of fluid a day".

## 2012-08-08 NOTE — Progress Notes (Signed)
Utilization Review Completed Natori Gudino J. Kennidy Lamke, RN, BSN, NCM [phone]  

## 2012-08-08 NOTE — Progress Notes (Signed)
08/08/12 Received a call from Central telemetry stating that patient had a notification of 30 beat run of VTach.  Patient is v paced on the monitor with a heartrate of 110.  Patient is asymptomatic and denies chest pain.  Cardmaster notified of event with no new orders received.  Anastasia Fiedler RN

## 2012-08-08 NOTE — Care Management Note (Addendum)
  Page 2 of 2   08/08/2012     4:02:52 PM   CARE MANAGEMENT NOTE 08/08/2012  Patient:  Raymond [name],Raymond [name]   Account Number:  [id number]  Date Initiated:  08/08/2012  Documentation initiated by:  [facility]  Subjective/Objective Assessment:   52 y.o. y/o male w/ PMHx of chronic systolic HF, non-ischemic cardiomyopathy  w/ AICD/PPM, paroxysmal A-fib (cardioverted on 07/27/12), R. CVA w/ residual left-sided weakness, HTN, and hypothyroidism, ADMITTED WITH SOB//HM WITH GF     Action/Plan:   DIURESE//HOME W HH   Anticipated DC Date:  08/11/2012   Anticipated DC Plan:  HOME W HOME HEALTH SERVICES      DC Planning Services  CM consult      [facility]  Resumption Of Svcs/PTA Provider   Choice offered to / List presented to:          [facility] arranged  HH-1 RN  HH-10 DISEASE MANAGEMENT      HH agency  [facility]   Status of service:  Completed, signed off Medicare Important Message given?   (If response is "NO", the following Medicare IM given date fields will be blank) Date Medicare IM given:   Date Additional Medicare IM given:    Discharge Disposition:    Per UR Regulation:  Reviewed for med. necessity/level of care/duration of stay  If discussed at Long Length of Stay Meetings, dates discussed:    Comments:  08/08/12@ 1530.Marland KitchenMarland KitchenOletta Cohn, RN, BSN, [employer] [phone] Spoke with pt at bedside concerning discharge planning.  Pt states he currently has [facility] servcies and would like to continue with them.  Kizzie Furnish of [facility] notified for resumption of services.

## 2012-08-08 NOTE — Progress Notes (Signed)
[facility]  Patient Status: Active (receiving services up to time of hospitalization)  [facility] is providing the following services: RN and OT  If patient discharges after hours, please call [phone].   Raymond [name] 08/08/2012, 3:17 PM

## 2012-08-09 ENCOUNTER — Encounter (HOSPITAL_COMMUNITY): Payer: Self-pay | Admitting: Anesthesiology

## 2012-08-09 DIAGNOSIS — E876 Hypokalemia: Secondary | ICD-10-CM | POA: Diagnosis not present

## 2012-08-09 LAB — BASIC METABOLIC PANEL
BUN: 26 mg/dL — ABNORMAL HIGH (ref 6–23)
CO2: 27 mEq/L (ref 19–32)
Calcium: 8.5 mg/dL (ref 8.4–10.5)
Glucose, Bld: 122 mg/dL — ABNORMAL HIGH (ref 70–99)
Sodium: 137 mEq/L (ref 135–145)

## 2012-08-09 LAB — PROTIME-INR: INR: 3.1 — ABNORMAL HIGH (ref 0.00–1.49)

## 2012-08-09 MED ORDER — METOPROLOL TARTRATE 50 MG PO TABS
50.0000 mg | ORAL_TABLET | Freq: Two times a day (BID) | ORAL | Status: DC
  Administered 2012-08-09 – 2012-08-11 (×3): 50 mg via ORAL
  Filled 2012-08-09 (×5): qty 1

## 2012-08-09 MED ORDER — SODIUM CHLORIDE 0.9 % IV SOLN
INTRAVENOUS | Status: DC
  Administered 2012-08-10: 500 mL via INTRAVENOUS

## 2012-08-09 MED ORDER — TORSEMIDE 20 MG PO TABS
20.0000 mg | ORAL_TABLET | Freq: Two times a day (BID) | ORAL | Status: DC
  Administered 2012-08-09 – 2012-08-11 (×3): 20 mg via ORAL
  Filled 2012-08-09 (×6): qty 1

## 2012-08-09 MED ORDER — POTASSIUM CHLORIDE CRYS ER 20 MEQ PO TBCR
40.0000 meq | EXTENDED_RELEASE_TABLET | Freq: Once | ORAL | Status: AC
  Administered 2012-08-09: 40 meq via ORAL

## 2012-08-09 MED ORDER — WARFARIN SODIUM 6 MG PO TABS
6.0000 mg | ORAL_TABLET | Freq: Once | ORAL | Status: AC
  Administered 2012-08-09: 6 mg via ORAL
  Filled 2012-08-09: qty 1

## 2012-08-09 NOTE — Progress Notes (Signed)
The patient was SOB on exertion and coughing.  The MD on-call was contacted concerning Lasix that was not given on the previous shift.  The RN was told to give the Lasix that was due at 1800 now and to let the rounding team know in the morning about the scheduling of the next dose.  The RN let the oncoming day shift nurse know this information.

## 2012-08-09 NOTE — Progress Notes (Signed)
Text page to DR Myrtis Ser. Orders received. Pt 's SBP Lt arm 86 asymptomatic - Rt Arm 102. New parameters for metropolol other orders received

## 2012-08-09 NOTE — Progress Notes (Addendum)
Patient ID: Raymond [name], male   DOB: [birth year], 52 y.o.   MRN: [id number]   SUBJECTIVE:  Patient is feeling better. He is diuresing. His rate is controlled. Atrial fibrillation continues. His primary symptom with this admission was PND and orthopnea. This is improving. He also had a cough that appears to persist. Cough medicine helps.  Last week his INR had been 1.2. This was concerning with the fact that he had cardioversion earlier this month. His first INR here in the hospital however was 2.4. His INRs continue to be therapeutic. Therefore he is not on heparin.   Filed Vitals:   08/08/12 1041 08/08/12 1300 08/08/12 2054 08/09/12 0516  BP: 108/82 98/58 130/70 101/76  Pulse: 55 73 92 96  Temp: 98.1 F (36.7 C) 98 F (36.7 C) 97.3 F (36.3 C) 98 F (36.7 C)  TempSrc: Oral Oral Oral Oral  Resp: 20 22 22 21   Height:      Weight:    278 lb 1.6 oz (126.145 kg)  SpO2: 99% 100% 95% 99%    Intake/Output Summary (Last 24 hours) at 08/09/12 1045 Last data filed at 08/09/12 0900  Gross per 24 hour  Intake   1273 ml  Output   2451 ml  Net  -1178 ml    LABS: Basic Metabolic Panel:  Recent Labs  78/29/56 0545 08/09/12 0400  NA 136 137  K 3.7 3.4*  CL 98 99  CO2 25 27  GLUCOSE 155* 122*  BUN 24* 26*  CREATININE 1.74* 1.62*  CALCIUM 8.9 8.5   Liver Function Tests: No results found for this basename: AST, ALT, ALKPHOS, BILITOT, PROT, ALBUMIN,  in the last 72 hours No results found for this basename: LIPASE, AMYLASE,  in the last 72 hours CBC:  Recent Labs  08/07/12 1135  WBC 7.4  HGB 13.2  HCT 40.8  MCV 88.1  PLT 288   Cardiac Enzymes:  Recent Labs  08/07/12 1822 08/07/12 2347 08/08/12 0545  TROPONINI <0.30 <0.30 <0.30   BNP: No components found with this basename: POCBNP,  D-Dimer: No results found for this basename: DDIMER,  in the last 72 hours Hemoglobin A1C: No results found for this basename: HGBA1C,  in the last 72 hours Fasting Lipid Panel: No  results found for this basename: CHOL, HDL, LDLCALC, TRIG, CHOLHDL, LDLDIRECT,  in the last 72 hours Thyroid Function Tests: No results found for this basename: TSH, T4TOTAL, FREET3, T3FREE, THYROIDAB,  in the last 72 hours  RADIOLOGY: Dg Chest 2 View  08/07/2012   *RADIOLOGY REPORT*  Clinical Data: Shortness of breath and weakness for 2 days, light chest pain yesterday  CHEST - 2 VIEW  Comparison: 06/22/2012  Findings: Left subclavian pacemaker / AICD leads project over right atrium, right ventricle and coronary sinus. Enlargement of cardiac silhouette with pulmonary vascular congestion. Mediastinal contours normal. No acute infiltrate, pleural effusion or pneumothorax. Bones unremarkable.  IMPRESSION: Enlargement of cardiac silhouette with pulmonary vascular congestion post pacemaker and AICD. No acute abnormalities.   Original Report Authenticated By: Ulyses Southward, M.D.    PHYSICAL EXAM   patient is oriented to person time and place. Affect is normal. There is no jugulovenous distention. Lungs are now clear. The abdomen is soft. There is no significant peripheral edema. Cardiac exam reveals S1 and S2.   TELEMETRY:  I have reviewed telemetry today August 09, 2012. There is atrial fibrillation. There are some pacemaker spikes seen in the QRS. It is my understanding that the pacemaker  was to have been interrogated. There are no notes about this in this electrolyte medical record. I failed to check in his chart on the floor to see if there is a paper record.  ASSESSMENT AND PLAN:    FIBRILLATION, ATRIAL     Patient was cardioverted earlier in July. He converted. He says that he did feel better with that. With this current admission he is back in atrial fib. Plans are underway for repeat cardioversion. This was being arranged as a TEE cardioversion. I feel that this is appropriate. However the overall story is complicated. Initially there was question of a clot by TEE. Therefore he received a full 4 weeks  of coumadinization before cardioversion was done. It was not necessary to do repeat TEE for that cardioversion. However, since he had an INR as low as 1.2 last week, it is appropriate to again do a TEE. He has been receiving 400 mg of amiodarone daily. One could argue that he is more loaded with amiodarone. Otherwise, there may not be a definite rationale for thinking that he can hold sinus rhythm at this time. It is clear that he needs sinus rhythm. I spoke with Mardella Layman about arranging the TEE cardioversion for tomorrow.    Biventricular implantable cardioverter-defibrillator in situ    Encounter for long-term (current) use of anticoagulants    I have outlined above the status of his Coumadin at this time.    Non-ischemic cardiomyopathy    Acute on chronic systolic CHF (congestive heart failure)     Patient is diuresing and improving. It seems that he will probably not be stabilized unless we returned him to sinus rhythm. His blood pressure is running lower today. I will be stopping his IV Lasix. At home he was on by mouth Lasix. I have decided to change him to by mouth Demadex to see if this helps him more.    CKD III     Renal function is a little better today.    Hypokalemia     Patient will be given extra potassium today.  Willa Rough 08/09/2012 10:45 AM

## 2012-08-09 NOTE — Progress Notes (Signed)
ANTICOAGULATION CONSULT NOTE - Follow Up Consult  Pharmacy Consult for Coumadin Indication: atrial fibrillation  Allergies  Allergen Reactions  . Losartan Cough  . Valsartan Cough    Patient Measurements: Height: 5\' 9"  (175.3 cm) Weight: 278 lb 1.6 oz (126.145 kg) (Scale B) IBW/kg (Calculated) : 70.7   Vital Signs: Temp: 98.2 F (36.8 C) (07/23 1020) Temp src: Oral (07/23 1020) BP: 86/60 mmHg (07/23 1045) Pulse Rate: 96 (07/23 1045)  Labs:  Recent Labs  08/07/12 1135 08/07/12 1822 08/07/12 2347 08/08/12 0545 08/09/12 0400  HGB 13.2  --   --   --   --   HCT 40.8  --   --   --   --   PLT 288  --   --   --   --   LABPROT  --  25.4*  --  27.6* 30.8*  INR  --  2.40*  --  2.68* 3.10*  CREATININE 1.37*  --   --  1.74* 1.62*  TROPONINI  --  <0.30 <0.30 <0.30  --     Estimated Creatinine Clearance: 72.5 ml/min (by C-G formula based on Cr of 1.62).   Medical History: Past Medical History  Diagnosis Date  . Systolic CHF, chronic   . Non-ischemic cardiomyopathy     a. echo 11/10: EF 15%;   b. cath 10/08: normal cors;  c. Echo 8/12: EF 20-25%;  d. Myoview 5/12: Very mild distal ant and apical isch, EF 17% (low risk-medical Rx cont'd);  e.  Echo 4/14:  EF 15%, diff HK, severe diast dysfn, restrictive physiology, E/medial e' > 15 suggests LVEDP at least 20 mmHg, borderline dilated Ao root, mild MR, mod LAE, mod reduced RVSF, mod TR, PASP 64  . Atrial fibrillation     ON COUMADIN  . Hypertension   . Hypothyroidism     s/p RAI therapy  . Obesity   . Non-compliance   . Dyslexia   . 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 RBBB (right bundle branch block)   . Palpitations     W/WIDE COMPLEX TACHYCARDIA IN THE PAST, REFUSED REPEATED OFFERS OF AN ICD  . Biventricular ICD  st Judes]   . Shortness of breath   . Stroke 11/2009    Assessment: 52 year old male on Coumadin PTA for Afib.  INR now slightly  supratherapeutic 3.10 Dose PTA = 7.5 mg daily except for 11.25 mg on Fridays  Goal of Therapy:  INR 2-3 Monitor platelets by anticoagulation protocol: Yes   Plan:  1) Coumadin 6 mg po x 1 tonight 2) Daily INR  Thank you. Talbert Cage, PharmD [phone]  08/09/2012,1:09 PM

## 2012-08-09 NOTE — Plan of Care (Signed)
Problem: Phase I Progression Outcomes Goal: EF % per last Echo/documented,Core Reminder form on chart Outcome: Completed/Met Date Met:  08/09/12 EF 06/23/12 10%

## 2012-08-09 NOTE — Progress Notes (Signed)
Pt amb in hall approx 250 ft  . SOB upon excertion HR up to 130's. Back to sitting on edge of bed recovering after walk.

## 2012-08-10 ENCOUNTER — Encounter (HOSPITAL_COMMUNITY): Payer: Self-pay | Admitting: Anesthesiology

## 2012-08-10 ENCOUNTER — Ambulatory Visit: Payer: Medicare Other | Admitting: Physician Assistant

## 2012-08-10 ENCOUNTER — Encounter (HOSPITAL_COMMUNITY): Payer: Self-pay | Admitting: *Deleted

## 2012-08-10 ENCOUNTER — Inpatient Hospital Stay (HOSPITAL_COMMUNITY): Payer: Medicare Other | Admitting: Anesthesiology

## 2012-08-10 ENCOUNTER — Encounter (HOSPITAL_COMMUNITY)
Admission: EM | Disposition: A | Discharge status: Home / Self Care | Payer: Self-pay | Source: Home / Self Care | Attending: Cardiology

## 2012-08-10 DIAGNOSIS — I4891 Unspecified atrial fibrillation: Secondary | ICD-10-CM

## 2012-08-10 HISTORY — PX: TEE WITHOUT CARDIOVERSION: SHX5443

## 2012-08-10 HISTORY — PX: CARDIOVERSION: SHX1299

## 2012-08-10 LAB — BASIC METABOLIC PANEL
BUN: 31 mg/dL — ABNORMAL HIGH (ref 6–23)
CO2: 33 mEq/L — ABNORMAL HIGH (ref 19–32)
Calcium: 9.2 mg/dL (ref 8.4–10.5)
Creatinine, Ser: 1.81 mg/dL — ABNORMAL HIGH (ref 0.50–1.35)
Glucose, Bld: 119 mg/dL — ABNORMAL HIGH (ref 70–99)

## 2012-08-10 SURGERY — ECHOCARDIOGRAM, TRANSESOPHAGEAL
Anesthesia: General

## 2012-08-10 MED ORDER — MIDAZOLAM HCL 10 MG/2ML IJ SOLN
INTRAMUSCULAR | Status: DC | PRN
  Administered 2012-08-10: 1 mg via INTRAVENOUS
  Administered 2012-08-10: 2 mg via INTRAVENOUS

## 2012-08-10 MED ORDER — FENTANYL CITRATE 0.05 MG/ML IJ SOLN
INTRAMUSCULAR | Status: DC | PRN
  Administered 2012-08-10 (×2): 25 ug via INTRAVENOUS

## 2012-08-10 MED ORDER — BUTAMBEN-TETRACAINE-BENZOCAINE 2-2-14 % EX AERO
INHALATION_SPRAY | CUTANEOUS | Status: DC | PRN
  Administered 2012-08-10: 2 via TOPICAL

## 2012-08-10 MED ORDER — MIDAZOLAM HCL 5 MG/ML IJ SOLN
INTRAMUSCULAR | Status: AC
  Filled 2012-08-10: qty 3

## 2012-08-10 MED ORDER — PROPOFOL 10 MG/ML IV BOLUS
INTRAVENOUS | Status: DC | PRN
  Administered 2012-08-10: 30 mg via INTRAVENOUS

## 2012-08-10 MED ORDER — LIDOCAINE HCL (CARDIAC) 20 MG/ML IV SOLN
INTRAVENOUS | Status: DC | PRN
  Administered 2012-08-10: 100 mg via INTRAVENOUS

## 2012-08-10 MED ORDER — FENTANYL CITRATE 0.05 MG/ML IJ SOLN
INTRAMUSCULAR | Status: AC
  Filled 2012-08-10: qty 2

## 2012-08-10 NOTE — Progress Notes (Signed)
Pt returned from TEE/Cardioversion VS done no c/o pain alert/oriented.

## 2012-08-10 NOTE — CV Procedure (Signed)
TEE:  3 mg versed 50 ug of fentanyl  Severe LVE with spontaneous contrast EF 15% Mild RV enlargemetn with AICD wires in place Moderate TR Biatrial enlargement Mild MR Normal AV Spontaneous contrast in LA and LAA but no thrombus No ASD or PFO  DCC:  30 mg propofol  120 J biphasic converted afib rate 95 to NSR rate 77  No immediate neurologic sequelae On Rx coumadin AICD to be interogated.    Charlton Haws

## 2012-08-10 NOTE — Transfer of Care (Signed)
Immediate Anesthesia Transfer of Care Note  Patient: Raymond [name]  Procedure(s) Performed: Procedure(s): TRANSESOPHAGEAL ECHOCARDIOGRAM (TEE) (N/A) CARDIOVERSION (N/A)  Patient Location: Endoscopy Unit  Anesthesia Type:General  Level of Consciousness: awake, alert  and oriented  Airway & Oxygen Therapy: Patient Spontanous Breathing and Patient connected to nasal cannula oxygen  Post-op Assessment: Report given to PACU RN, Post -op Vital signs reviewed and stable and Patient moving all extremities X 4  Post vital signs: Reviewed and stable  Complications: No apparent anesthesia complications

## 2012-08-10 NOTE — Preoperative (Signed)
Beta Blockers   Reason not to administer Beta Blockers:Not Applicable, pt took lopressor 08/10/12 @0931 

## 2012-08-10 NOTE — Progress Notes (Signed)
*  PRELIMINARY RESULTS* Echocardiogram Echocardiogram Transesophageal has been performed.  Raymond [name] 08/10/2012, 2:50 PM

## 2012-08-10 NOTE — Progress Notes (Signed)
Patient schedule for TEE with Cardioversion today. INR this am 4.36. Dr. Myrtis Ser paged and made aware.

## 2012-08-10 NOTE — Interval H&P Note (Signed)
History and Physical Interval Note:  08/10/2012 2:06 PM  Raymond [name]  has presented today for surgery, with the diagnosis of A FIB   The various methods of treatment have been discussed with the patient and family. After consideration of risks, benefits and other options for treatment, the patient has consented to  Procedure(s): TRANSESOPHAGEAL ECHOCARDIOGRAM (TEE) (N/A) CARDIOVERSION (N/A) as a surgical intervention .  The patient's history has been reviewed, patient examined, no change in status, stable for surgery.  I have reviewed the patient's chart and labs.  Questions were answered to the patient's satisfaction.     Charlton Haws

## 2012-08-10 NOTE — Progress Notes (Addendum)
Patient ID: Raymond [name], male   DOB: 08-31-1960, 52 y.o.   MRN: [id number]   SUBJECTIVE:  Plans are made for TEE cardioversion later today. Creatinine is climbing. I have changed the patient from IV Lasix to oral Demadex yesterday. I have discontinued his Zaroxolyn.. Decision about resuming this will have to be made tomorrow.    Filed Vitals:   08/09/12 1045 08/09/12 1320 08/09/12 2210 08/10/12 0555  BP: 86/60 113/90 100/60 111/75  Pulse: 96 95 107 99  Temp:  98 F (36.7 C) 98.5 F (36.9 C) 98 F (36.7 C)  TempSrc:  Oral Oral Oral  Resp:  22 16 22   Height:      Weight:    276 lb 9.6 oz (125.465 kg)  SpO2:  100% 99% 100%    Intake/Output Summary (Last 24 hours) at 08/10/12 0724 Last data filed at 08/09/12 2244  Gross per 24 hour  Intake   1482 ml  Output   1750 ml  Net   -268 ml    LABS: Basic Metabolic Panel:  Recent Labs  29/56/21 0400 08/10/12 0415  NA 137 138  K 3.4* 4.2  CL 99 97  CO2 27 33*  GLUCOSE 122* 119*  BUN 26* 31*  CREATININE 1.62* 1.81*  CALCIUM 8.5 9.2   Liver Function Tests: No results found for this basename: AST, ALT, ALKPHOS, BILITOT, PROT, ALBUMIN,  in the last 72 hours No results found for this basename: LIPASE, AMYLASE,  in the last 72 hours CBC:  Recent Labs  08/07/12 1135  WBC 7.4  HGB 13.2  HCT 40.8  MCV 88.1  PLT 288   Cardiac Enzymes:  Recent Labs  08/07/12 1822 08/07/12 2347 08/08/12 0545  TROPONINI <0.30 <0.30 <0.30   BNP: No components found with this basename: POCBNP,  D-Dimer: No results found for this basename: DDIMER,  in the last 72 hours Hemoglobin A1C: No results found for this basename: HGBA1C,  in the last 72 hours Fasting Lipid Panel: No results found for this basename: CHOL, HDL, LDLCALC, TRIG, CHOLHDL, LDLDIRECT,  in the last 72 hours Thyroid Function Tests: No results found for this basename: TSH, T4TOTAL, FREET3, T3FREE, THYROIDAB,  in the last 72 hours  RADIOLOGY: Dg Chest 2 View  08/07/2012    *RADIOLOGY REPORT*  Clinical Data: Shortness of breath and weakness for 2 days, light chest pain yesterday  CHEST - 2 VIEW  Comparison: 06/22/2012  Findings: Left subclavian pacemaker / AICD leads project over right atrium, right ventricle and coronary sinus. Enlargement of cardiac silhouette with pulmonary vascular congestion. Mediastinal contours normal. No acute infiltrate, pleural effusion or pneumothorax. Bones unremarkable.  IMPRESSION: Enlargement of cardiac silhouette with pulmonary vascular congestion post pacemaker and AICD. No acute abnormalities.   Original Report Authenticated By: Ulyses Southward, M.D.    PHYSICAL EXAM  patient is oriented to person time and place. He stable. He is lying flat in bed. He still has a cough. This does not appear to be cardiac in origin. There is no jugulovenous distention. Lungs are clear. Respiratory effort is nonlabored. Cardiac exam reveals S1 and S2. The abdomen is soft. There is no significant peripheral edema.   TELEMETRY: I have reviewed telemetry today, August 10, 2012. The rate is controlled. The rhythm is atrial fibrillation.   ASSESSMENT AND PLAN:    FIBRILLATION, ATRIAL     The patient was admitted with return of his atrial fibrillation and return of his volume overload with chronic congestive heart failure. He  had been cardioverted on July 27, 2012. He said he definitely felt better with this. It appears that keeping him in sinus rhythm is very important. This is why I have arranged for cardioversion today. In addition I made the decision to do TEE cardioversion. Originally he had a TEE and his cardioversion was put off for 4 weeks of documented full anticoagulation. He then had an outpatient cardioversion without TEE. Last week however he did have an INR documented at 1.2. His meds were adjusted and he is fully anticoagulated with Coumadin here in the hospital. However with this event, I felt it was appropriate to do another TEE. My thinking is that  unless there is a definite clot seen his cardioversion can be done today.  I have mentioned in my notes before that I'm not sure what the next step will be concerning his rhythm. I believe there is a rationale to cardiovert him again because he is symptomatic and because he has had more time on amiodarone 400 mg daily. The next question will be choice of antiarrhythmics and whether or not even more aggressive electrophysiology procedures should be done such as AV node ablation so that he can biventricular paced all of the time.  I did speak with Dr. Graciela Husbands and Joice Lofts today. Imdur will be sure that the recent pacer interrogation was done. She will also help at the time of cardioversion if possible so we can be sure what his rhythm is after the cardioversion. Ultimately, we will need input from Dr. Antoine Poche concerning what steps he has in mind next.    Biventricular implantable cardioverter-defibrillator in situ       Non-ischemic cardiomyopathy    Acute on chronic systolic CHF (congestive heart failure)     Clinically this is definitely improved with IV diuresis in the hospital. Initially IV meds along with Zaroxolyn were used. I then changed him to Demadex to be used for the first time to see if it works better for him than Lasix. Today I stop his Zaroxolyn because his creatinine is rising.    Warfarin anticoagulation     His INR is actually on the high side now.    CKD (chronic kidney disease), stage III     Hypokalemia    Potassium is improved. Zaroxolyn has been stopped. Very careful attention is necessary concerning his potassium dosing going forward.  The TEE cardioversion is scheduled for later today. Considering all of the issues with him, I feel it would be best to watch him during the evening with plans for him to go home tomorrow if all issues are stable.   Willa Rough 08/10/2012 7:24 AM

## 2012-08-10 NOTE — Anesthesia Preprocedure Evaluation (Addendum)
Anesthesia Evaluation  Patient identified by MRN, date of birth, ID band Patient awake    Reviewed: Allergy & Precautions, H&P , NPO status , Patient's Chart, lab work & pertinent test results, reviewed documented beta blocker date and time   Airway       Dental  (+) Dental Advisory Given   Pulmonary shortness of breath,          Cardiovascular hypertension, Pt. on home beta blockers +CHF + dysrhythmias Atrial Fibrillation + Cardiac Defibrillator   Echo 4/14:  EF 15%, diff HK, severe diastolic dysfunction mild MR, mod TR   Neuro/Psych CVA    GI/Hepatic   Endo/Other  Hypothyroidism   Renal/GU Renal InsufficiencyRenal disease     Musculoskeletal   Abdominal   Peds  Hematology   Anesthesia Other Findings   Reproductive/Obstetrics                          Anesthesia Physical Anesthesia Plan  ASA: III  Anesthesia Plan: General   Post-op Pain Management:    Induction: Intravenous  Airway Management Planned: Mask  Additional Equipment:   Intra-op Plan:   Post-operative Plan:   Informed Consent: I have reviewed the patients History and Physical, chart, labs and discussed the procedure including the risks, benefits and alternatives for the proposed anesthesia with the patient or authorized representative who has indicated his/her understanding and acceptance.   Dental advisory given  Plan Discussed with: Anesthesiologist, Surgeon and CRNA  Anesthesia Plan Comments:        Anesthesia Quick Evaluation

## 2012-08-10 NOTE — Progress Notes (Signed)
ANTICOAGULATION CONSULT NOTE - Follow Up Consult  Pharmacy Consult for Coumadin Indication: atrial fibrillation  Allergies  Allergen Reactions  . Losartan Cough  . Valsartan Cough    Patient Measurements: Height: 5\' 9"  (175.3 cm) Weight: 276 lb 9.6 oz (125.465 kg) (scale B) IBW/kg (Calculated) : 70.7   Vital Signs: Temp: 98.2 F (36.8 C) (07/24 0920) Temp src: Oral (07/24 0920) BP: 99/53 mmHg (07/24 0920) Pulse Rate: 94 (07/24 0920)  Labs:  Recent Labs  08/07/12 1135  08/07/12 1822 08/07/12 2347 08/08/12 0545 08/09/12 0400 08/10/12 0415  HGB 13.2  --   --   --   --   --   --   HCT 40.8  --   --   --   --   --   --   PLT 288  --   --   --   --   --   --   LABPROT  --   < > 25.4*  --  27.6* 30.8* 40.0*  INR  --   < > 2.40*  --  2.68* 3.10* 4.36*  CREATININE 1.37*  --   --   --  1.74* 1.62* 1.81*  TROPONINI  --   --  <0.30 <0.30 <0.30  --   --   < > = values in this interval not displayed.  Estimated Creatinine Clearance: 64.7 ml/min (by C-G formula based on Cr of 1.81).   Medical History: Past Medical History  Diagnosis Date  . Systolic CHF, chronic   . Non-ischemic cardiomyopathy     a. echo 11/10: EF 15%;   b. cath 10/08: normal cors;  c. Echo 8/12: EF 20-25%;  d. Myoview 5/12: Very mild distal ant and apical isch, EF 17% (low risk-medical Rx cont'd);  e.  Echo 4/14:  EF 15%, diff HK, severe diast dysfn, restrictive physiology, E/medial e' > 15 suggests LVEDP at least 20 mmHg, borderline dilated Ao root, mild MR, mod LAE, mod reduced RVSF, mod TR, PASP 64  . Atrial fibrillation     ON COUMADIN  . Hypertension   . Hypothyroidism     s/p RAI therapy  . Obesity   . Non-compliance   . Dyslexia   . 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 RBBB (right bundle branch block)   . Palpitations     W/WIDE COMPLEX TACHYCARDIA IN THE PAST, REFUSED REPEATED OFFERS OF AN ICD  . Biventricular ICD  st  Judes]   . Shortness of breath   . Stroke 11/2009    Assessment: 52 year old male on Coumadin PTA for Afib.  INR increased from yesterday despite lower dose Dose PTA = 7.5 mg daily except for 11.25 mg on Fridays  Goal of Therapy:  INR 2-3 Monitor platelets by anticoagulation protocol: Yes   Plan:  1) No coumadin today 2) Daily INR  Thank you. Talbert Cage, PharmD [phone]  08/10/2012,10:26 AM

## 2012-08-10 NOTE — Anesthesia Postprocedure Evaluation (Signed)
  Anesthesia Post-op Note  Patient: Raymond [name]  Procedure(s) Performed: Procedure(s): TRANSESOPHAGEAL ECHOCARDIOGRAM (TEE) (N/A) CARDIOVERSION (N/A)  Patient Location: endoscopy  Anesthesia Type:General  Level of Consciousness: awake, alert  and oriented  Airway and Oxygen Therapy: Patient Spontanous Breathing and Patient connected to nasal cannula oxygen  Post-op Pain: none  Post-op Assessment: Post-op Vital signs reviewed, Patient's Cardiovascular Status Stable, Respiratory Function Stable, Patent Airway and No signs of Nausea or vomiting  Post-op Vital Signs: Reviewed and stable  Complications: No apparent anesthesia complications

## 2012-08-11 ENCOUNTER — Encounter (HOSPITAL_COMMUNITY): Payer: Self-pay | Admitting: Physician Assistant

## 2012-08-11 LAB — PROTIME-INR: INR: 3.42 — ABNORMAL HIGH (ref 0.00–1.49)

## 2012-08-11 LAB — BASIC METABOLIC PANEL
Calcium: 9.3 mg/dL (ref 8.4–10.5)
GFR calc non Af Amer: 44 mL/min — ABNORMAL LOW (ref >90)
Sodium: 137 mEq/L (ref 135–145)

## 2012-08-11 MED ORDER — POTASSIUM CHLORIDE CRYS ER 20 MEQ PO TBCR: 40.0000 meq | EXTENDED_RELEASE_TABLET | Freq: Two times a day (BID) | ORAL | Status: DC

## 2012-08-11 MED ORDER — TORSEMIDE 20 MG PO TABS: 20.0000 mg | ORAL_TABLET | Freq: Two times a day (BID) | ORAL | Status: DC

## 2012-08-11 MED ORDER — WARFARIN SODIUM 5 MG PO TABS
5.0000 mg | ORAL_TABLET | Freq: Once | ORAL | Status: DC
  Filled 2012-08-11: qty 1

## 2012-08-11 NOTE — Progress Notes (Signed)
   SUBJECTIVE:  He feels OK and he wants to go home.     PHYSICAL EXAM Filed Vitals:   08/10/12 1556 08/10/12 2114 08/10/12 2206 08/11/12 0530  BP: 98/75 85/49 104/65 90/68  Pulse: 83 105 73 76  Temp: 97.6 F (36.4 C) 97.1 F (36.2 C)  97.7 F (36.5 C)  TempSrc: Oral Oral  Oral  Resp: 20 18  18   Height:      Weight:    275 lb (124.739 kg)  SpO2: 98% 94%  99%   General:  No distress Lungs:  Clear Heart:  RRR Abdomen:  Positive bowel sounds, no rebound no guarding Extremities:  No edema  LABS:  Results for orders placed during the hospital encounter of 08/07/12 (from the past 24 hour(s))  PROTIME-INR     Status: Abnormal   Collection Time    08/11/12  5:20 AM      Result Value Range   Prothrombin Time 33.2 (*) 11.6 - 15.2 seconds   INR 3.42 (*) 0.00 - 1.49  BASIC METABOLIC PANEL     Status: Abnormal   Collection Time    08/11/12  5:20 AM      Result Value Range   Sodium 137  135 - 145 mEq/L   Potassium 3.9  3.5 - 5.1 mEq/L   Chloride 96  96 - 112 mEq/L   CO2 33 (*) 19 - 32 mEq/L   Glucose, Bld 105 (*) 70 - 99 mg/dL   BUN 30 (*) 6 - 23 mg/dL   Creatinine, Ser 1.61 (*) 0.50 - 1.35 mg/dL   Calcium 9.3  8.4 - 09.6 mg/dL   GFR calc non Af Amer 44 (*) >90 mL/min   GFR calc Af Amer 51 (*) >90 mL/min    Intake/Output Summary (Last 24 hours) at 08/11/12 0917 Last data filed at 08/11/12 0848  Gross per 24 hour  Intake   1400 ml  Output    600 ml  Net    800 ml    ASSESSMENT AND PLAN:  ATRIAL FIB:  S/P cardioversion.  Maintaining NSR.  Given previous problems with his thyroid on amiodarone we are going to need to watch this closely.    ACUTE ON CHRONIC SYSTOLIC AND DIASTOLIC HF:  Needs BMET early next week. Meds as on MAR.   CKD:  STAGE III :  Stable  [name] 08/11/2012 9:17 AM

## 2012-08-11 NOTE — Discharge Summary (Signed)
CARDIOLOGY DISCHARGE SUMMARY   Patient ID: Raymond [name] MRN: [id number] DOB/AGE: [birth year] 52 y.o.  Admit date: 08/07/2012 Discharge date: 08/11/2012  Primary Discharge Diagnosis:    Atrial fibrillation with rapid ventricular response  Secondary Discharge Diagnosis:    Acute on chronic systolic CHF (congestive heart failure)   Biventricular implantable cardioverter-defibrillator in situ   Encounter for long-term (current) use of anticoagulants   Non-ischemic cardiomyopathy   Warfarin anticoagulation   CKD (chronic kidney disease), stage III   Hypokalemia  Procedures: TEE, DCCV,   Hospital Course: Raymond [name] is a 52 y.o. male with a history of CHF and PAF but no history of CAD. He had recently been hospitalized with heart failure and rapid atrial fibrillation. Because of a possible clot he had to wait 4 weeks for cardioversion but this was finally performed on 07/27/2012. Initially, he felt much better. However, he developed increasing shortness of breath and increasing heart failure and came to the hospital on the day of admission. He was in rapid atrial fibrillation at that time. He was admitted for further evaluation and treatment.  CHF: He was felt to have acute on chronic systolic CHF but this was felt secondary to his rapid atrial fibrillation. His BNP was elevated. He was diuresed and with diuresis his shortness of breath improved. As his volume status improved, he was changed from IV Lasix to oral Demadex. This is to be followed closely as an outpatient. His weight at discharge was 275 pounds  Warfarin anticoagulation: His INR had been subtherapeutic the week prior. His INRs on admission were therapeutic and this was managed by pharmacy during his hospital stay. He is therapeutic at discharge and is to followup as scheduled.  Rapid atrial fibrillation: Because of the recent subtherapeutic INR, and required another TEE cardioversion to get him back in sinus rhythm. By  08/10/2012, his respiratory status was improved to the point that he could undergo the procedure. His EF was 15% with spontaneous contrast but no thrombus. Full results are below. He had one shock and converted to normal sinus rhythm. He was maintaining normal sinus rhythm at discharge.  St. Jude biventricular ICD: His device was interrogated after the cardioversion to make sure was functioning normally. There were no problems or concerns with device and he is to followup with the office as scheduled.  Nonischemic cardiomyopathy: He is known to have a nonischemic cardiomyopathy so no ischemic workup was indicated. His EF was 15% at TEE, which is known.  Chronic kidney disease stage III: This GFR has been less than 60 since May of 2014. This will continue to be followed closely as an outpatient. His peak BUN/creatinine were 31/1.81 this admission.  Hypokalemia: His potassium level was low on admission at 3.2. This was supplemented and followed closely. His potassium is 3.9 at discharge.  On 08/11/2012, he was evaluated by Dr. Antoine Poche. His volume status was felt to be at baseline and he was maintaining sinus rhythm. He was ambulating without chest pain or palpitations and considered stable for discharge, to follow up as an outpatient.  Labs:   Lab Results  Component Value Date   WBC 7.4 08/07/2012   HGB 13.2 08/07/2012   HCT 40.8 08/07/2012   MCV 88.1 08/07/2012   PLT 288 08/07/2012     Recent Labs Lab 08/11/12 0520  NA 137  K 3.9  CL 96  CO2 33*  BUN 30*  CREATININE 1.74*  CALCIUM 9.3  GLUCOSE 105*   Lab Results  Component Value Date   TROPONINI <0.30 08/08/2012   Pro B Natriuretic peptide (BNP)  Date/Time Value Range Status  08/07/2012 11:35 AM 4323.0* 0 - 125 pg/mL Final  07/26/2012  1:47 PM 324.0* 0.0 - 100.0 pg/mL Final    Recent Labs  08/11/12 0520  INR 3.42*      Radiology: Dg Chest 2 View 08/07/2012   *RADIOLOGY REPORT*  Clinical Data: Shortness of breath and weakness  for 2 days, light chest pain yesterday  CHEST - 2 VIEW  Comparison: 06/22/2012  Findings: Left subclavian pacemaker / AICD leads project over right atrium, right ventricle and coronary sinus. Enlargement of cardiac silhouette with pulmonary vascular congestion. Mediastinal contours normal. No acute infiltrate, pleural effusion or pneumothorax. Bones unremarkable.  IMPRESSION: Enlargement of cardiac silhouette with pulmonary vascular congestion post pacemaker and AICD. No acute abnormalities.   Original Report Authenticated By: Ulyses Southward, M.D.   EKG: 08/07/2012 Vent. rate 104 BPM PR interval * ms QRS duration 160 ms QT/QTc 414/544 ms P-R-T axes * -82 89  TEE/DCCV: 08/10/2012 TEE: 3 mg versed 50 ug of fentanyl  Severe LVE with spontaneous contrast EF 15% Mild RV enlargemetn with AICD wires in place  Moderate TR  Biatrial enlargement  Mild MR  Normal AV  Spontaneous contrast in LA and LAA but no thrombus  No ASD or PFO  DCC: 30 mg propofol 120 J biphasic converted afib rate 95 to NSR rate 77  No immediate neurologic sequelae  FOLLOW UP PLANS AND APPOINTMENTS Allergies  Allergen Reactions  . Losartan Cough  . Valsartan Cough     Medication List    STOP taking these medications       furosemide 80 MG tablet  Commonly known as:  LASIX     metolazone 2.5 MG tablet  Commonly known as:  ZAROXOLYN      TAKE these medications       acyclovir 800 MG tablet  Commonly known as:  ZOVIRAX  Take 800 mg by mouth daily.     ALPRAZolam 0.25 MG tablet  Commonly known as:  XANAX  Take 0.25 mg by mouth at bedtime as needed for sleep.     amiodarone 200 MG tablet  Commonly known as:  PACERONE  Take 1 tablet (200 mg total) by mouth 2 (two) times daily.     digoxin 0.125 MG tablet  Commonly known as:  LANOXIN  Take 0.0625 mg by mouth at bedtime.     levothyroxine 200 MCG tablet  Commonly known as:  SYNTHROID, LEVOTHROID  Take 400 mcg by mouth daily before breakfast.     losartan  50 MG tablet  Commonly known as:  COZAAR  Take 1 tablet (50 mg total) by mouth daily.     metoprolol 50 MG tablet  Commonly known as:  LOPRESSOR  Take 1 tablet (50 mg total) by mouth 2 (two) times daily.     potassium chloride SA 20 MEQ tablet  Commonly known as:  K-DUR,KLOR-CON  Take 2 tablets (40 mEq total) by mouth 2 (two) times daily.     spironolactone 50 MG tablet  Commonly known as:  ALDACTONE  Take 25 mg by mouth daily.     torsemide 20 MG tablet  Commonly known as:  DEMADEX  Take 1 tablet (20 mg total) by mouth 2 (two) times daily.     warfarin 7.5 MG tablet  Commonly known as:  COUMADIN  Take 7.5-11.25 mg by mouth daily. Takes 7.5mg  every day except for  11.25mg  on Friday        Discharge Orders   Future Appointments Provider Department Dept Phone   08/14/2012 10:00 AM [facility] Nurse [facility] Main Office [place]) [phone]   Patient should bring all current BP medications.   08/14/2012 10:15 AM [facility] [employer] Main Office [place]) [phone]   09/05/2012 1:45 PM Duke Salvia, MD [facility] Main Office [place]) [phone]   09/05/2012 2:00 PM [name], MD [facility] Main Office [place]) [phone]   09/11/2012 1:00 PM Romero Belling, MD [facility]IMARY [facility] ENDOCRINOLOGY [phone]   10/25/2012 3:00 PM Romero Belling, MD [facility] PRIMARY CARE ENDOCRINOLOGY [phone]   Future Orders Complete By Expires     (HEART FAILURE PATIENTS) Call MD:  Anytime you have any of the following symptoms: 1) 3 pound weight gain in 24 hours or 5 pounds in 1 week 2) shortness of breath, with or without a dry hacking cough 3) swelling in the hands, feet or stomach 4) if you have to sleep on extra pillows at night in order to breathe.  As directed     ACE Inhibitor / ARB already ordered  As directed     ACE Inhibitor / ARB already ordered  As directed     Avoid straining  As directed     Diet - low sodium heart healthy  As directed      Heart Failure patients record your daily weight using the same scale at the same time of day  As directed     Increase activity slowly  As directed     STOP any activity that causes chest pain, shortness of breath, dizziness, sweating, or exessive weakness  As directed       Follow-up Information   Follow up with [facility] On 08/14/2012. (RN visit and BMET at 10:00 am. Do not have to be fasting and can take am meds.)    Contact information:   [address] [place] [address]       BRING ALL MEDICATIONS WITH YOU TO FOLLOW UP APPOINTMENTS  Time spent with patient to include physician time: 41 min Signed: Theodore Demark, PA-C 08/11/2012, 11:57 AM Co-Sign MD  Patient seen and examined.  Plan as discussed in my rounding note for today and outlined above. [name]  08/11/2012  1:02 PM

## 2012-08-11 NOTE — Progress Notes (Addendum)
Pt lying in bed. No complaints of Pain or Sob. Mild Whz heard on assessment

## 2012-08-11 NOTE — Plan of Care (Signed)
Problem: Phase I Progression Outcomes Goal: EF % per last Echo/documented,Core Reminder form on chart Outcome: Completed/Met Date Met:  08/11/12 10%

## 2012-08-11 NOTE — Progress Notes (Signed)
Pt d/c home. D/c instructions and medications reviewed with Pt. Pt states understanding. Pt states he has been managing his heart failure for sometime. Pt was anxious to be d/c. All Pt questions answered

## 2012-08-14 ENCOUNTER — Ambulatory Visit (INDEPENDENT_AMBULATORY_CARE_PROVIDER_SITE_OTHER): Payer: Medicare Other | Admitting: *Deleted

## 2012-08-14 ENCOUNTER — Other Ambulatory Visit (INDEPENDENT_AMBULATORY_CARE_PROVIDER_SITE_OTHER): Payer: Medicare Other

## 2012-08-14 ENCOUNTER — Telehealth: Payer: Self-pay | Admitting: *Deleted

## 2012-08-14 ENCOUNTER — Encounter (HOSPITAL_COMMUNITY): Payer: Self-pay | Admitting: Cardiovascular Disease

## 2012-08-14 VITALS — BP 111/73 | HR 81 | Ht 69.0 in | Wt 279.5 lb

## 2012-08-14 DIAGNOSIS — I5022 Chronic systolic (congestive) heart failure: Secondary | ICD-10-CM

## 2012-08-14 DIAGNOSIS — I509 Heart failure, unspecified: Secondary | ICD-10-CM

## 2012-08-14 LAB — BASIC METABOLIC PANEL
GFR: 54.78 mL/min — ABNORMAL LOW (ref >60.00)
Potassium: 3.7 mEq/L (ref 3.5–5.1)
Sodium: 137 mEq/L (ref 135–145)

## 2012-08-14 NOTE — Progress Notes (Signed)
Pt came in, for BP check and Labs (BMET). PT was upset because he said  that a visiting nurse is coming to see him at 1:30 Pm today and she could have checked his BP and have gotten the labs needed, pt said that  he did not need to come all the way here. Pt's BP Left arm 111/73, right arm 113/68, pulse 78 to 81 beats/minute. Pt has not taken his medications this AM, wt 279 lbs 8 oz 5 ft 9 in. Pt. has no c/o  today except for a dry cough.

## 2012-08-14 NOTE — Telephone Encounter (Signed)
Patient recent hospitalized, resume INR order

## 2012-08-15 ENCOUNTER — Other Ambulatory Visit: Payer: Self-pay | Admitting: *Deleted

## 2012-08-15 MED ORDER — LOSARTAN POTASSIUM 50 MG PO TABS: 50.0000 mg | ORAL_TABLET | Freq: Every day | ORAL | Status: DC

## 2012-08-15 NOTE — Telephone Encounter (Addendum)
Spoke with pt, he has developed a cough again. His weight yesterday was 279 lbs, at discharge he was 275. He has no edema in feet and ankles but he feels his abdomin is larger. He denies SOB. He is having trouble with SOB when lying flat. He was changed to torsemide 20 mg bid at discharge. He has not taken that yet today and has not weighed. Pt instructed to take two torsemide this am and two this evening. The pt was also instructed to take an extra potassium tablet this am and this evening. The home health is coming to see him tomorrow and instructed the pt to have them give [place] a call if he does not improve. Pt agreed with this plan.

## 2012-08-15 NOTE — Telephone Encounter (Signed)
New prob  PT states that his Lasix is not working.

## 2012-08-16 ENCOUNTER — Encounter: Payer: Self-pay | Admitting: Cardiology

## 2012-08-16 ENCOUNTER — Ambulatory Visit (INDEPENDENT_AMBULATORY_CARE_PROVIDER_SITE_OTHER): Payer: Medicare Other | Admitting: Cardiovascular Disease

## 2012-08-16 DIAGNOSIS — I4891 Unspecified atrial fibrillation: Secondary | ICD-10-CM

## 2012-08-16 DIAGNOSIS — Z7901 Long term (current) use of anticoagulants: Secondary | ICD-10-CM

## 2012-08-16 LAB — POCT INR: INR: 6.8

## 2012-08-16 NOTE — Telephone Encounter (Signed)
Follow up call   [facility]    Was told to call back and speak with nurse. Patient was seen today ... C/O cough up blood was report by patient.  inr  6.8 . Coumadin clinic was notified . Patient was told to eat leafy green vegetable.  To hold coumadin.

## 2012-08-16 NOTE — Telephone Encounter (Signed)
PER AMBER  PT INFORMED AMBER  THAT SHE NEEDED TO CALL OFFICE   AFTER REVIEWING  CHART NOT  SURE  WHY  SHE NEEDED TO CALL  AND PREVIOUS MESSAGE  RE  6.8 INR  HAS BEEN ADDRESSED .Zack Seal

## 2012-08-23 ENCOUNTER — Ambulatory Visit (INDEPENDENT_AMBULATORY_CARE_PROVIDER_SITE_OTHER): Payer: Medicare Other | Admitting: Internal Medicine

## 2012-08-23 DIAGNOSIS — Z7901 Long term (current) use of anticoagulants: Secondary | ICD-10-CM

## 2012-08-23 DIAGNOSIS — I4891 Unspecified atrial fibrillation: Secondary | ICD-10-CM

## 2012-08-23 LAB — POCT INR: INR: 2.7

## 2012-08-24 ENCOUNTER — Telehealth: Payer: Self-pay | Admitting: Cardiology

## 2012-08-24 NOTE — Telephone Encounter (Signed)
New Prob  Pt states he is coughing a lot and it is difficult from him to lay down due to the coughing. He also said he has gained about 3 lbs over the past few days.

## 2012-08-24 NOTE — Telephone Encounter (Signed)
Pt per - states his wts is up 3 lbs since Friday, ankles are larger and he is not able to lay down to sleep.  Reports last night he was on three pillow and was still coughing.  Pt was instructed to increase Torsemide to to 40 tonight and in the AM.  He should call back tomorrow afternoon to let us know if he has had any improvement.  Pt is in agreement

## 2012-08-29 ENCOUNTER — Telehealth: Payer: Self-pay | Admitting: Cardiology

## 2012-08-29 NOTE — Telephone Encounter (Signed)
New problem    [facility] need verbal order to do re certification. To continue to see pt. Please call.

## 2012-08-29 NOTE — Telephone Encounter (Signed)
Verbal ok given to [facility]

## 2012-08-30 ENCOUNTER — Telehealth: Payer: Self-pay | Admitting: Cardiology

## 2012-08-30 ENCOUNTER — Ambulatory Visit (INDEPENDENT_AMBULATORY_CARE_PROVIDER_SITE_OTHER): Payer: Medicare Other | Admitting: Cardiovascular Disease

## 2012-08-30 DIAGNOSIS — Z7901 Long term (current) use of anticoagulants: Secondary | ICD-10-CM

## 2012-08-30 DIAGNOSIS — I4891 Unspecified atrial fibrillation: Secondary | ICD-10-CM

## 2012-08-30 LAB — POCT INR: INR: 5

## 2012-08-30 NOTE — Telephone Encounter (Signed)
Spoke with [facility]. Pt having dry hacking cough for 3 days, lungs are clear.  Resting heart rate 110, heart rate drops for 10 seconds when he coughs  then picks back up.   weight today 174.4, 1 week ago weight was 174.3.

## 2012-08-30 NOTE — Telephone Encounter (Signed)
New problem   [facility] stated pt is having a lot of coughing, lung sounds are clear. Hr is tachycardic and his heart is slowing when he is coughing. His wt was 174.4 and he isn't voiding as normal.

## 2012-08-30 NOTE — Telephone Encounter (Signed)
Pt's current med list includes losartan, there are some notes from 09/2011 losartan was changed to Diovan because of a cough. I called pt to ask him if he remembered taking losartan in the past and having a problem with a cough. He did not remember. I will forward to Dr Antoine Poche for review.

## 2012-08-31 ENCOUNTER — Telehealth: Payer: Self-pay | Admitting: Cardiology

## 2012-08-31 NOTE — Telephone Encounter (Signed)
New prob  Pt states that he is still coughing a lot. He wants to know what he can do. See previous TE.

## 2012-08-31 NOTE — Telephone Encounter (Signed)
Pt called after-hours. See previous telephone notes regarding cough - per nurse, there were some notes from 09/2011 that losartan was changed to Diovan because of a cough. The patient apparently didn't recall having a cough with this in the past. Dr. Antoine Poche planned to review at his 8/19 appt. The patient has had the cough for 4 days, mostly dry and nonproductive but occasionally white sputum. Looking back in hospital notes, it also appears this cough was present in July 2014. His nurse told him his ankles are slightly puffier but he has not had any weight gain. No SOB. No current chest pain. No fever. Robitussin hasn't helped. He has been compliant with meds. Given his complex medical history and symptoms, I advise he proceed to urgent care this evening as the cough is really bothering him because this sounds like a set of issues that needs to be evaluated in person. He was not open to this idea, citing they may close soon but I informed him there are centers that are open another hour and a half. He declined. I also offered the ER as an alternative but he again declined. I told him I cannot accurately diagnose him over the phone given that a cough could be many number of things. I told him we could try an extra dose of demadex tonight to see if it helped, then to call the office tomorrow AM to see about getting an earlier f/u appt. He agreed with the latter half of that statement. I advised him to go to the ER if he develops SOB, weakness, CP, worsening symptoms, or any other symptoms concerning to him. He verbalized understanding and gratitude. Mishayla Sliwinski PA-C

## 2012-08-31 NOTE — Telephone Encounter (Signed)
He has an office visit on 8/19 and we can address this then.

## 2012-09-01 ENCOUNTER — Emergency Department (HOSPITAL_COMMUNITY): Payer: Medicare Other

## 2012-09-01 ENCOUNTER — Telehealth: Payer: Self-pay | Admitting: Cardiology

## 2012-09-01 ENCOUNTER — Inpatient Hospital Stay (HOSPITAL_COMMUNITY)
Admission: EM | Admit: 2012-09-01 | Discharge: 2012-09-04 | DRG: 291 | Disposition: A | Discharge status: Home Health | Payer: Medicare Other | Attending: Internal Medicine | Admitting: Internal Medicine

## 2012-09-01 ENCOUNTER — Encounter (HOSPITAL_COMMUNITY): Payer: Self-pay | Admitting: Nurse Practitioner

## 2012-09-01 DIAGNOSIS — E039 Hypothyroidism, unspecified: Secondary | ICD-10-CM | POA: Diagnosis present

## 2012-09-01 DIAGNOSIS — J9600 Acute respiratory failure, unspecified whether with hypoxia or hypercapnia: Secondary | ICD-10-CM | POA: Diagnosis present

## 2012-09-01 DIAGNOSIS — R05 Cough: Secondary | ICD-10-CM

## 2012-09-01 DIAGNOSIS — I428 Other cardiomyopathies: Secondary | ICD-10-CM | POA: Diagnosis present

## 2012-09-01 DIAGNOSIS — Z6841 Body Mass Index (BMI) 40.0 and over, adult: Secondary | ICD-10-CM

## 2012-09-01 DIAGNOSIS — I4891 Unspecified atrial fibrillation: Secondary | ICD-10-CM | POA: Diagnosis present

## 2012-09-01 DIAGNOSIS — J209 Acute bronchitis, unspecified: Secondary | ICD-10-CM | POA: Diagnosis present

## 2012-09-01 DIAGNOSIS — Z7901 Long term (current) use of anticoagulants: Secondary | ICD-10-CM

## 2012-09-01 DIAGNOSIS — Z95810 Presence of automatic (implantable) cardiac defibrillator: Secondary | ICD-10-CM

## 2012-09-01 DIAGNOSIS — E0580 Other thyrotoxicosis without thyrotoxic crisis or storm: Secondary | ICD-10-CM | POA: Diagnosis present

## 2012-09-01 DIAGNOSIS — Z8673 Personal history of transient ischemic attack (TIA), and cerebral infarction without residual deficits: Secondary | ICD-10-CM

## 2012-09-01 DIAGNOSIS — N289 Disorder of kidney and ureter, unspecified: Secondary | ICD-10-CM | POA: Diagnosis present

## 2012-09-01 DIAGNOSIS — I509 Heart failure, unspecified: Secondary | ICD-10-CM | POA: Diagnosis present

## 2012-09-01 DIAGNOSIS — I10 Essential (primary) hypertension: Secondary | ICD-10-CM | POA: Diagnosis present

## 2012-09-01 DIAGNOSIS — T380X5A Adverse effect of glucocorticoids and synthetic analogues, initial encounter: Secondary | ICD-10-CM | POA: Diagnosis not present

## 2012-09-01 DIAGNOSIS — D72829 Elevated white blood cell count, unspecified: Secondary | ICD-10-CM | POA: Diagnosis not present

## 2012-09-01 DIAGNOSIS — I5023 Acute on chronic systolic (congestive) heart failure: Principal | ICD-10-CM

## 2012-09-01 HISTORY — DX: Syncope and collapse: R55

## 2012-09-01 HISTORY — DX: Cough syncope: R05.4

## 2012-09-01 HISTORY — DX: Cough: R05

## 2012-09-01 LAB — CBC WITH DIFFERENTIAL/PLATELET
Basophils Absolute: 0 K/uL (ref 0.0–0.1)
Basophils Relative: 0 % (ref 0–1)
Eosinophils Absolute: 0 K/uL (ref 0.0–0.7)
Eosinophils Relative: 0 % (ref 0–5)
HCT: 38.9 % — ABNORMAL LOW (ref 39.0–52.0)
Hemoglobin: 13.1 g/dL (ref 13.0–17.0)
Lymphocytes Relative: 9 % — ABNORMAL LOW (ref 12–46)
Lymphs Abs: 1.1 K/uL (ref 0.7–4.0)
MCH: 29.1 pg (ref 26.0–34.0)
MCHC: 33.7 g/dL (ref 30.0–36.0)
MCV: 86.4 fL (ref 78.0–100.0)
Monocytes Absolute: 1.5 K/uL — ABNORMAL HIGH (ref 0.1–1.0)
Monocytes Relative: 13 % — ABNORMAL HIGH (ref 3–12)
Neutro Abs: 9 K/uL — ABNORMAL HIGH (ref 1.7–7.7)
Neutrophils Relative %: 77 % (ref 43–77)
Platelets: 296 K/uL (ref 150–400)
RBC: 4.5 MIL/uL (ref 4.22–5.81)
RDW: 15.7 % — ABNORMAL HIGH (ref 11.5–15.5)
WBC: 11.6 K/uL — ABNORMAL HIGH (ref 4.0–10.5)

## 2012-09-01 LAB — POCT I-STAT TROPONIN I: Troponin i, poc: 0.04 ng/mL (ref 0.00–0.08)

## 2012-09-01 LAB — BASIC METABOLIC PANEL WITH GFR
BUN: 16 mg/dL (ref 6–23)
CO2: 23 meq/L (ref 19–32)
Calcium: 9 mg/dL (ref 8.4–10.5)
Chloride: 99 meq/L (ref 96–112)
Creatinine, Ser: 1.26 mg/dL (ref 0.50–1.35)
GFR calc Af Amer: 76 mL/min — ABNORMAL LOW
GFR calc non Af Amer: 65 mL/min — ABNORMAL LOW
Glucose, Bld: 127 mg/dL — ABNORMAL HIGH (ref 70–99)
Potassium: 3.9 meq/L (ref 3.5–5.1)
Sodium: 136 meq/L (ref 135–145)

## 2012-09-01 LAB — DIGOXIN LEVEL: Digoxin Level: 0.6 ng/mL — ABNORMAL LOW (ref 0.8–2.0)

## 2012-09-01 MED ORDER — POTASSIUM CHLORIDE CRYS ER 20 MEQ PO TBCR
40.0000 meq | EXTENDED_RELEASE_TABLET | Freq: Two times a day (BID) | ORAL | Status: DC
  Administered 2012-09-01 – 2012-09-04 (×6): 40 meq via ORAL
  Filled 2012-09-01 (×8): qty 2

## 2012-09-01 MED ORDER — AMIODARONE LOAD VIA INFUSION
150.0000 mg | Freq: Once | INTRAVENOUS | Status: AC
  Administered 2012-09-01: 150 mg via INTRAVENOUS
  Filled 2012-09-01: qty 83.34

## 2012-09-01 MED ORDER — ALBUTEROL SULFATE (5 MG/ML) 0.5% IN NEBU
5.0000 mg | INHALATION_SOLUTION | Freq: Once | RESPIRATORY_TRACT | Status: AC
  Administered 2012-09-01: 5 mg via RESPIRATORY_TRACT
  Filled 2012-09-01: qty 1

## 2012-09-01 MED ORDER — ACYCLOVIR 800 MG PO TABS
800.0000 mg | ORAL_TABLET | Freq: Every day | ORAL | Status: DC
  Administered 2012-09-01 – 2012-09-02 (×2): 800 mg via ORAL
  Filled 2012-09-01 (×3): qty 1

## 2012-09-01 MED ORDER — ALPRAZOLAM 0.25 MG PO TABS: 0.2500 mg | ORAL_TABLET | Freq: Every evening | ORAL | Status: DC | PRN

## 2012-09-01 MED ORDER — AMIODARONE HCL IN DEXTROSE 360-4.14 MG/200ML-% IV SOLN
60.0000 mg/h | INTRAVENOUS | Status: AC
  Administered 2012-09-01 (×2): 60 mg/h via INTRAVENOUS
  Filled 2012-09-01: qty 200

## 2012-09-01 MED ORDER — LEVOFLOXACIN 750 MG PO TABS
750.0000 mg | ORAL_TABLET | Freq: Every day | ORAL | Status: DC
  Administered 2012-09-01 – 2012-09-04 (×4): 750 mg via ORAL
  Filled 2012-09-01 (×4): qty 1

## 2012-09-01 MED ORDER — SPIRONOLACTONE 25 MG PO TABS
25.0000 mg | ORAL_TABLET | Freq: Every day | ORAL | Status: DC
  Administered 2012-09-01 – 2012-09-04 (×4): 25 mg via ORAL
  Filled 2012-09-01 (×4): qty 1

## 2012-09-01 MED ORDER — DIGOXIN 125 MCG PO TABS
0.2500 mg | ORAL_TABLET | Freq: Once | ORAL | Status: AC
  Administered 2012-09-01: 0.25 mg via ORAL
  Filled 2012-09-01: qty 2

## 2012-09-01 MED ORDER — METHYLPREDNISOLONE SODIUM SUCC 125 MG IJ SOLR
60.0000 mg | Freq: Once | INTRAMUSCULAR | Status: AC
  Administered 2012-09-01: 60 mg via INTRAVENOUS
  Filled 2012-09-01: qty 2

## 2012-09-01 MED ORDER — DIGOXIN 0.0625 MG HALF TABLET
0.0625 mg | ORAL_TABLET | Freq: Every day | ORAL | Status: DC
  Administered 2012-09-01 – 2012-09-03 (×3): 0.0625 mg via ORAL
  Filled 2012-09-01 (×4): qty 1

## 2012-09-01 MED ORDER — METOPROLOL TARTRATE 50 MG PO TABS
50.0000 mg | ORAL_TABLET | Freq: Two times a day (BID) | ORAL | Status: DC
  Administered 2012-09-01 – 2012-09-02 (×3): 50 mg via ORAL
  Filled 2012-09-01 (×5): qty 1

## 2012-09-01 MED ORDER — PREDNISONE 20 MG PO TABS
40.0000 mg | ORAL_TABLET | Freq: Every day | ORAL | Status: DC
  Administered 2012-09-02 – 2012-09-04 (×3): 40 mg via ORAL
  Filled 2012-09-01 (×4): qty 2

## 2012-09-01 MED ORDER — LEVALBUTEROL HCL 0.63 MG/3ML IN NEBU
0.6300 mg | INHALATION_SOLUTION | Freq: Four times a day (QID) | RESPIRATORY_TRACT | Status: DC
  Administered 2012-09-01 – 2012-09-04 (×10): 0.63 mg via RESPIRATORY_TRACT
  Filled 2012-09-01 (×20): qty 3

## 2012-09-01 MED ORDER — LEVOTHYROXINE SODIUM 200 MCG PO TABS
400.0000 ug | ORAL_TABLET | Freq: Every day | ORAL | Status: DC
  Administered 2012-09-02 – 2012-09-04 (×3): 400 ug via ORAL
  Filled 2012-09-01 (×4): qty 2

## 2012-09-01 MED ORDER — AMIODARONE HCL IN DEXTROSE 360-4.14 MG/200ML-% IV SOLN
30.0000 mg/h | INTRAVENOUS | Status: DC
  Administered 2012-09-02: 30 mg/h via INTRAVENOUS
  Filled 2012-09-01 (×9): qty 200

## 2012-09-01 MED ORDER — ACETAMINOPHEN 325 MG PO TABS
650.0000 mg | ORAL_TABLET | ORAL | Status: DC | PRN
  Administered 2012-09-04: 650 mg via ORAL
  Filled 2012-09-01: qty 2

## 2012-09-01 MED ORDER — GUAIFENESIN-CODEINE 100-10 MG/5ML PO SOLN
5.0000 mL | ORAL | Status: DC | PRN
  Administered 2012-09-03 – 2012-09-04 (×2): 5 mL via ORAL
  Filled 2012-09-01 (×2): qty 5

## 2012-09-01 MED ORDER — WARFARIN - PHARMACIST DOSING INPATIENT
Freq: Every day | Status: DC
  Administered 2012-09-02: 18:00:00

## 2012-09-01 MED ORDER — FUROSEMIDE 10 MG/ML IJ SOLN
80.0000 mg | Freq: Once | INTRAMUSCULAR | Status: AC
  Administered 2012-09-01: 80 mg via INTRAVENOUS
  Filled 2012-09-01: qty 8

## 2012-09-01 MED ORDER — TORSEMIDE 20 MG PO TABS
20.0000 mg | ORAL_TABLET | Freq: Two times a day (BID) | ORAL | Status: DC
  Administered 2012-09-01 – 2012-09-04 (×6): 20 mg via ORAL
  Filled 2012-09-01 (×8): qty 1

## 2012-09-01 MED ORDER — SODIUM CHLORIDE 0.9 % IJ SOLN
3.0000 mL | Freq: Two times a day (BID) | INTRAMUSCULAR | Status: DC
  Administered 2012-09-01 – 2012-09-04 (×5): 3 mL via INTRAVENOUS

## 2012-09-01 MED ORDER — HYDROCOD POLST-CHLORPHEN POLST 10-8 MG/5ML PO LQCR
5.0000 mL | Freq: Once | ORAL | Status: AC
  Administered 2012-09-01: 5 mL via ORAL
  Filled 2012-09-01: qty 5

## 2012-09-01 MED ORDER — SODIUM CHLORIDE 0.9 % IJ SOLN: 3.0000 mL | INTRAMUSCULAR | Status: DC | PRN

## 2012-09-01 MED ORDER — WARFARIN SODIUM 2.5 MG PO TABS
3.7500 mg | ORAL_TABLET | Freq: Once | ORAL | Status: AC
  Administered 2012-09-01: 3.75 mg via ORAL
  Filled 2012-09-01: qty 1

## 2012-09-01 MED ORDER — GUAIFENESIN-DM 100-10 MG/5ML PO SYRP
5.0000 mL | ORAL_SOLUTION | ORAL | Status: DC | PRN
  Administered 2012-09-01 – 2012-09-03 (×3): 5 mL via ORAL
  Filled 2012-09-01 (×3): qty 5

## 2012-09-01 MED ORDER — BENZONATATE 100 MG PO CAPS
100.0000 mg | ORAL_CAPSULE | Freq: Three times a day (TID) | ORAL | Status: DC
  Administered 2012-09-01 – 2012-09-04 (×8): 100 mg via ORAL
  Filled 2012-09-01 (×10): qty 1

## 2012-09-01 MED ORDER — CODEINE SULFATE 30 MG PO TABS
30.0000 mg | ORAL_TABLET | Freq: Four times a day (QID) | ORAL | Status: DC | PRN
  Administered 2012-09-01: 30 mg via ORAL
  Filled 2012-09-01: qty 2

## 2012-09-01 MED ORDER — ZOLPIDEM TARTRATE 5 MG PO TABS: 5.0000 mg | ORAL_TABLET | Freq: Every evening | ORAL | Status: DC | PRN

## 2012-09-01 MED ORDER — ONDANSETRON HCL 4 MG/2ML IJ SOLN: 4.0000 mg | Freq: Four times a day (QID) | INTRAMUSCULAR | Status: DC | PRN

## 2012-09-01 MED ORDER — NITROGLYCERIN 0.4 MG SL SUBL: 0.4000 mg | SUBLINGUAL_TABLET | SUBLINGUAL | Status: DC | PRN

## 2012-09-01 MED ORDER — ACETAMINOPHEN 325 MG PO TABS
650.0000 mg | ORAL_TABLET | Freq: Once | ORAL | Status: AC
  Administered 2012-09-01: 650 mg via ORAL
  Filled 2012-09-01: qty 2

## 2012-09-01 MED ORDER — SODIUM CHLORIDE 0.9 % IV SOLN: 250.0000 mL | INTRAVENOUS | Status: DC | PRN

## 2012-09-01 NOTE — ED Notes (Signed)
Pt states he has been SOB for the last 2 weeks. Increased lower extremity edema. Hx of CHF. Resp labored. Pt alert, oriented x4.

## 2012-09-01 NOTE — Progress Notes (Addendum)
ANTICOAGULATION CONSULT NOTE - Initial Consult  Pharmacy Consult for coumadin Indication: atrial fibrillation  Allergies  Allergen Reactions  . Losartan Cough  . Valsartan Cough    Patient Measurements: Weight: 274 lb (124.286 kg)  Vital Signs: Temp: 101.3 F (38.5 C) (08/15 1527) Temp src: Oral (08/15 1527) BP: 126/96 mmHg (08/15 1600) Pulse Rate: 110 (08/15 1530)  Labs:  Recent Labs  08/30/12 09/01/12 1144 09/01/12 1237  HGB  --  13.1  --   HCT  --  38.9*  --   PLT  --  296  --   LABPROT  --   --  26.1*  INR 5.0  --  2.49*  CREATININE  --  1.26  --     The CrCl is unknown because both a height and weight (above a minimum accepted value) are required for this calculation.   Medical History: Past Medical History  Diagnosis Date  . Systolic CHF, chronic   . Non-ischemic cardiomyopathy     a. echo 11/10: EF 15%;   b. cath 10/08: normal cors;  c. Myoview 5/12: Very mild distal ant and apical isch, EF 17% (low risk-medical Rx cont'd);  d.  Echo 4/14:  EF 15%;  e. 05/2009 s/p SJM BiV ICD;  f. 07/2012 TEE: EF 20-25%, diff HK mild MR, mod TR.   Marland Kitchen Atrial fibrillation     a. on coumadin;  b. 07/2012 s/p TEE/DCCV  . Hypertension   . Hypothyroidism     s/p RAI therapy  . Obesity   . Non-compliance   . Dyslexia   . 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 RBBB (right bundle branch block)   . Biventricular ICD  St Jude 06/09/2009    Medications:  See med rec  Assessment: Patient is a 52 y.o M on coumadin PTA for hx afib.  Admitted to the ED today with c/o cough, SOB, and LE edema.  He was also found to be in Afib.  Patient was seen at the Anti-coag clinic on 8/13 and was found to have an INR of 5.  He reported experiencing diarrhea 3-4 times the day before the visit.  He was given instructions to hold his dose on 8/13 and 8/14 and to start back today (8/15) at 3.75mg  daily except 7.5mg  on Sun, Tues,  and Thurs.  Patient has not taken dose prior to coming in to the ED.  INR is therapeutic today at 2.49   Goal of Therapy:  INR 2-3    Plan:  1) coumadin 3.75 mg PO x1  Bali Lyn P 09/01/2012,4:35 PM

## 2012-09-01 NOTE — ED Provider Notes (Signed)
CSN: [id number]     Arrival date & time 09/01/12  1115 History     First MD Initiated Contact with Patient 09/01/12 1118     Chief Complaint  Patient presents with  . Shortness of Breath   (Consider location/radiation/quality/duration/timing/severity/associated sxs/prior Treatment) HPI Patient is a 52 year old male who presented to emergency department with complaint of cough and shortness of breath. Patient states he has had cough for 2 weeks. Patient states he was recently hospitalized 3 weeks ago for congestive heart failure and heart arrhythmia. States had cough at that time but it has since worsened. Patient states that his cough is constant productive with clear sputum. Patient states that his cough and shortness of breath are worse with exertion. The patient states he is having hard time laying down flat. Patient reports taking fluid pills name of which he does not remember with no improvement in his symptoms. Patient admits to his mild lower extremity swelling bilaterally. Patient states he called his heart doctor and was told that he needed to go to emergency department if he is having shortness of breath. Patient does have an appointment with his cardiologist in 4 days. Patient denies any fever or chills. Patient denies any chest pressure. Patient states it does hurt in his left rib cage when he is coughing only. Past Medical History  Diagnosis Date  . Systolic CHF, chronic   . Non-ischemic cardiomyopathy     a. echo 11/10: EF 15%;   b. cath 10/08: normal cors;  c. Echo 8/12: EF 20-25%;  d. Myoview 5/12: Very mild distal ant and apical isch, EF 17% (low risk-medical Rx cont'd);  e.  Echo 4/14:  EF 15%, diff HK, severe diast dysfn, restrictive physiology, E/medial e' > 15 suggests LVEDP at least 20 mmHg, borderline dilated Ao root, mild MR, mod LAE, mod reduced RVSF, mod TR, PASP 64  . Atrial fibrillation     ON COUMADIN  . Hypertension   . Hypothyroidism     s/p RAI therapy  .  Obesity   . Non-compliance   . Dyslexia   . 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 RBBB (right bundle branch block)   . Palpitations     W/WIDE COMPLEX TACHYCARDIA IN THE PAST, REFUSED REPEATED OFFERS OF AN ICD  . 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 Shortness of breath   . Stroke 11/2009   Past Surgical History  Procedure Laterality Date  . Insert / replace / remove pacemaker  06/11/09    [employer] [id number] BIVENTRICULAR AICD SERIAL [phone]  . Knee surgery Left   . Cardiac catheterization    . Tee without cardioversion N/A 06/23/2012    Procedure: TRANSESOPHAGEAL ECHOCARDIOGRAM (TEE);  Surgeon: Vesta Mixer, MD;  Location: [facility] ENDOSCOPY;  Service: Cardiovascular;  Laterality: N/A;  TEE will be done at 0730 Cardioversion is scheduled at 1100-1200 Mary/ebp  . Cardioversion N/A 07/27/2012    Procedure: CARDIOVERSION;  Surgeon: Vesta Mixer, MD;  Location: [facility] ENDOSCOPY;  Service: Cardiovascular;  Laterality: N/A;  . Tee without cardioversion N/A 08/10/2012    Procedure: TRANSESOPHAGEAL ECHOCARDIOGRAM (TEE);  Surgeon: Wendall Stade, MD;  Location: [facility] ENDOSCOPY;  Service: Cardiovascular;  Laterality: N/A;  . Cardioversion N/A 08/10/2012    Procedure: CARDIOVERSION;  Surgeon: Wendall Stade, MD;  Location: [facility] ENDOSCOPY;  Service: Cardiovascular;  Laterality: N/A;   Family History  Problem Relation  Age of Onset  . Heart disease Father    History  Substance Use Topics  . Smoking status: Never Smoker   . Smokeless tobacco: Never Used  . Alcohol Use: No    Review of Systems  Constitutional: Positive for fatigue. Negative for fever and chills.  HENT: Negative for neck pain and neck stiffness.   Respiratory: Positive for cough and shortness of breath. Negative for chest tightness.   Cardiovascular: Positive for chest pain and leg swelling. Negative for palpitations.  Gastrointestinal: Negative for  nausea, vomiting, abdominal pain, diarrhea and abdominal distention.  Genitourinary: Negative for dysuria, urgency, frequency and hematuria.  Musculoskeletal: Negative for myalgias and arthralgias.  Skin: Negative for rash.  Allergic/Immunologic: Negative for immunocompromised state.  Neurological: Positive for dizziness, weakness and light-headedness. Negative for numbness and headaches.    Allergies  Losartan and Valsartan  Home Medications   Current Outpatient Rx  Name  Route  Sig  Dispense  Refill  . acyclovir (ZOVIRAX) 800 MG tablet   Oral   Take 800 mg by mouth daily.         Marland Kitchen ALPRAZolam (XANAX) 0.25 MG tablet   Oral   Take 0.25 mg by mouth at bedtime as needed for sleep.         Marland Kitchen amiodarone (PACERONE) 200 MG tablet   Oral   Take 1 tablet (200 mg total) by mouth 2 (two) times daily.   60 tablet   5   . digoxin (LANOXIN) 0.125 MG tablet   Oral   Take 0.0625 mg by mouth at bedtime.          Marland Kitchen levothyroxine (SYNTHROID, LEVOTHROID) 200 MCG tablet   Oral   Take 400 mcg by mouth daily before breakfast.         . losartan (COZAAR) 50 MG tablet   Oral   Take 1 tablet (50 mg total) by mouth daily.   30 tablet   6   . metoprolol (LOPRESSOR) 50 MG tablet   Oral   Take 1 tablet (50 mg total) by mouth 2 (two) times daily.   60 tablet   6   . potassium chloride SA (K-DUR,KLOR-CON) 20 MEQ tablet   Oral   Take 2 tablets (40 mEq total) by mouth 2 (two) times daily.   120 tablet   11   . spironolactone (ALDACTONE) 50 MG tablet   Oral   Take 25 mg by mouth daily.         Marland Kitchen torsemide (DEMADEX) 20 MG tablet   Oral   Take 1 tablet (20 mg total) by mouth 2 (two) times daily.   60 tablet   11   . warfarin (COUMADIN) 7.5 MG tablet   Oral   Take 7.5-11.25 mg by mouth daily. Takes 7.5mg  every day except for 11.25mg  on Friday          BP 137/103  Pulse 115  Temp(Src) 98.6 F (37 C) (Oral)  Resp 24  Wt 274 lb (124.286 kg)  BMI 40.44 kg/m2  SpO2  92% Physical Exam  Nursing note and vitals reviewed. Constitutional: He appears well-developed and well-nourished. No distress.  obese  HENT:  Head: Normocephalic and atraumatic.  Eyes: Conjunctivae are normal.  Neck: Neck supple.  Cardiovascular: Regular rhythm and normal heart sounds.  Tachycardia present.   Pulmonary/Chest: Effort normal. No respiratory distress. He has no wheezes. He has rales.  Abdominal: Soft. Bowel sounds are normal. He exhibits no distension. There is no tenderness. There  is no rebound.  Musculoskeletal: He exhibits no edema.  Trace edema of bilateral LE  Neurological: He is alert.  Skin: Skin is warm and dry.    ED Course   Procedures (including critical care time)   Date: 09/01/2012  Rate: 111  Rhythm: atrial fibrillation  QRS Axis: normal  Intervals: normal  ST/T Wave abnormalities: normal  Conduction Disutrbances:none  Narrative Interpretation:   Old EKG Reviewed: unchanged   Labs Reviewed  BASIC METABOLIC PANEL - Abnormal; Notable for the following:    Glucose, Bld 127 (*)    GFR calc non Af Amer 65 (*)    GFR calc Af Amer 76 (*)    All other components within normal limits  PRO B NATRIURETIC PEPTIDE - Abnormal; Notable for the following:    Pro B Natriuretic peptide (BNP) 3262.0 (*)    All other components within normal limits  CBC WITH DIFFERENTIAL - Abnormal; Notable for the following:    WBC 11.6 (*)    HCT 38.9 (*)    RDW 15.7 (*)    Neutro Abs 9.0 (*)    Lymphocytes Relative 9 (*)    Monocytes Relative 13 (*)    Monocytes Absolute 1.5 (*)    All other components within normal limits  PROTIME-INR - Abnormal; Notable for the following:    Prothrombin Time 26.1 (*)    INR 2.49 (*)    All other components within normal limits  DIGOXIN LEVEL - Abnormal; Notable for the following:    Digoxin Level 0.6 (*)    All other components within normal limits  POCT I-STAT TROPONIN I   Dg Chest 2 View  09/01/2012   *RADIOLOGY REPORT*   Clinical Data: Shortness of breath.  CHEST - 2 VIEW  Comparison: 08/07/2012.  Findings: Cardiomegaly with a defibrillator/ biventricular pacer in place.  Leads appear in a similar position to the prior exam.  Central pulmonary vascular prominence.  No infiltrate or pneumothorax.  Mild anterior wedging thoracic vertebra unchanged.  IMPRESSION: Cardiomegaly with a defibrillator/ biventricular pacer in place. Leads appear in a similar position to the prior exam.  Central pulmonary vascular prominence.   Original Report Authenticated By: Lacy Duverney, M.D.   2:02 PM Patient is feeling better after one neb treatments and Tussionex for his cough patient continues to be in atrial fibrillation with heart rate in 110s to 120s. Patient denies any current chest pain. He should digoxin level is low. Ordered 0.25 mg of digoxin. Patient's INR is therapeutic. Given patient continues to have shortness of breath, elevated heart rate, A. fib, cardiology was consulted and will see him in emergency department.  Records reviewed, and it appears that patient had recent hospitalization 3 weeks ago for similar presenting symptoms, and was eventually cardioverted while in the hospital into normal sinus rhythm which improved his symptoms.  1. Atrial fibrillation with RVR   2. Cough   3. CHF (congestive heart failure)     MDM  Patient to the emergency department complaining of cough and shortness of breath. Patient states symptoms for last 2 weeks. Recently discharged from the hospital 2 weeks ago where he was hospitalized for congestive heart failure exacerbation as well as atrial fibrillation with RVR. At that time Patient was cardioverted while in the hospital into normal sinus rhythm. Patient states that's when he felt better. Today patient is again in atrial fibrillation with rapid ventricular response with heart rate in 100 to 120s. Today's workup is significant for central pulmonary vascular prominence  on chest x-ray,  elevated proBNP level of the 3262, decreased digoxin level of 0.6 and a slightly elevated white blood cell count of 11.6. Initially infectious process was considered given elevated white blood count, rectal temperature was checked and was 99.5. In the emergency department patient was given an albuterol neb treatment and Tussionex for his cough which he states helped just slightly. Patient was also given a dose of digoxin. I have discussed the patient with cardiology and they will come by and see him. The patient most likely will need admission for heart rate control and diuresis.  Filed Vitals:   09/01/12 1527 09/01/12 1530 09/01/12 1545 09/01/12 1600  BP: 130/98 114/75  126/96  Pulse: 124 110    Temp: 101.3 F (38.5 C)     TempSrc: Oral     Resp: 24 21 20    Weight:      SpO2: 95% 93%  93%         Amaiya Scruton A Merlyn Bollen, PA-C 09/01/12 1622

## 2012-09-01 NOTE — Telephone Encounter (Signed)
New prob  Pt mother states is not feeling well. He is very restless and has been coughing. See TE by PA Dayna Dunn.Marland Kitchen

## 2012-09-01 NOTE — H&P (Signed)
Patient ID: Raymond CHAVARIN MRN: [id number], DOB/AGE: [birth year]   Admit date: 09/01/2012  Primary Physician: Dow Adolph, MD Primary Cardiologist: J. Hochrein, MD   Pt. Profile:  51 y/o male with h/o NICM and PAF s/p DCCV in July, who presents back to the ED with persistent cough, dyspnea, and lower ext edema, and found to be back in afib.  Problem List  Past Medical History  Diagnosis Date  . Systolic CHF, chronic   . Non-ischemic cardiomyopathy     a. echo 11/10: EF 15%;   b. cath 10/08: normal cors;  c. Myoview 5/12: Very mild distal ant and apical isch, EF 17% (low risk-medical Rx cont'd);  d.  Echo 4/14:  EF 15%;  e. 05/2009 s/p SJM BiV ICD;  f. 07/2012 TEE: EF 20-25%, diff HK mild MR, mod TR.   Marland Kitchen Atrial fibrillation     a. on coumadin;  b. 07/2012 s/p TEE/DCCV  . Hypertension   . Hypothyroidism     s/p RAI therapy  . Obesity   . Non-compliance   . Dyslexia   . 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 RBBB (right bundle branch block)   . 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 Insert / replace / remove pacemaker  06/11/09    [employer] [id number] BIVENTRICULAR AICD SERIAL [phone]  . Knee surgery Left   . Cardiac catheterization    . Tee without cardioversion N/A 06/23/2012    Procedure: TRANSESOPHAGEAL ECHOCARDIOGRAM (TEE);  Surgeon: Vesta Mixer, MD;  Location: [facility] ENDOSCOPY;  Service: Cardiovascular;  Laterality: N/A;  TEE will be done at 0730 Cardioversion is scheduled at 1100-1200 Mary/ebp  . Cardioversion N/A 07/27/2012    Procedure: CARDIOVERSION;  Surgeon: Vesta Mixer, MD;  Location: [facility] ENDOSCOPY;  Service: Cardiovascular;  Laterality: N/A;  . Tee without cardioversion N/A 08/10/2012    Procedure: TRANSESOPHAGEAL ECHOCARDIOGRAM (TEE);  Surgeon: Wendall Stade, MD;  Location: [facility] ENDOSCOPY;  Service: Cardiovascular;   Laterality: N/A;  . Cardioversion N/A 08/10/2012    Procedure: CARDIOVERSION;  Surgeon: Wendall Stade, MD;  Location: [facility] ENDOSCOPY;  Service: Cardiovascular;  Laterality: N/A;    Allergies  Allergies  Allergen Reactions  . Losartan Cough  . Valsartan Cough   HPI  52 y/o male with h/o NICM and an EF of 20-25%.  He also has been dealing with recurrent PAF and most recently is s/p TEE and DCCV in July.  He was d/c'd home in amiodarone.  Was doing well until about 2 weeks ago when he developed severe cough productive of whitish sputum. Coughing so bad that he cannot sleep and also his ribs hurt. Denies fevers or chills but temp is 101 here in ER. Weight has been stable. Just mild edema. HHRN noticed his pulse was fast on Wednesday.   If ER found to be in AF with RVR. CXR no edema. BNP elevated from baseline.   Home Medications  Prior to Admission medications   Medication Sig Start Date End Date Taking? Authorizing Provider  acyclovir (ZOVIRAX) 800 MG tablet Take 800 mg by mouth daily. 11/09/11  Yes Ryan M Dunn, PA-C  ALPRAZolam Prudy Feeler) 0.25 MG tablet Take 0.25 mg by mouth at bedtime as needed for sleep.   Yes Historical Provider, MD  amiodarone (PACERONE) 200 MG tablet Take 1 tablet (200 mg total)  by mouth 2 (two) times daily. 06/25/12  Yes Scott Moishe Spice, PA-C  digoxin (LANOXIN) 0.125 MG tablet Take 0.0625 mg by mouth at bedtime.    Yes Historical Provider, MD  furosemide (LASIX) 80 MG tablet Take 80 mg by mouth daily.  05/30/12  Yes Historical Provider, MD  levothyroxine (SYNTHROID, LEVOTHROID) 200 MCG tablet Take 400 mcg by mouth daily before breakfast.   Yes Historical Provider, MD  losartan (COZAAR) 50 MG tablet Take 1 tablet (50 mg total) by mouth daily. 08/15/12  Yes [name], MD  metolazone (ZAROXOLYN) 2.5 MG tablet Take 2.5 mg by mouth daily.  06/26/12  Yes Historical Provider, MD  metoprolol (LOPRESSOR) 50 MG tablet Take 1 tablet (50 mg total) by mouth 2 (two) times daily. 07/26/12   Yes Scott T Alben Spittle, PA-C  potassium chloride SA (K-DUR,KLOR-CON) 20 MEQ tablet Take 2 tablets (40 mEq total) by mouth 2 (two) times daily. 08/11/12  Yes Rhonda G Barrett, PA-C  spironolactone (ALDACTONE) 50 MG tablet Take 25 mg by mouth daily.   Yes Historical Provider, MD  torsemide (DEMADEX) 20 MG tablet Take 1 tablet (20 mg total) by mouth 2 (two) times daily. 08/11/12  Yes Rhonda G Barrett, PA-C  warfarin (COUMADIN) 7.5 MG tablet Take 7.5-11.25 mg by mouth daily. Takes 7.5mg  every day except for 11.25mg  on Friday   Yes Historical Provider, MD    Family History  Family History  Problem Relation Age of Onset  . Heart disease Father     Social History  History   Social History  . Marital Status: Legally Separated    Spouse Name: N/A    Number of Children: 0  . Years of Education: N/A   Occupational History  . DISABLED     HOWEVER, HE DOES WORK A LITTLE ON CARS   Social History Main Topics  . Smoking status: Never Smoker   . Smokeless tobacco: Never Used  . Alcohol Use: No  . Drug Use: No  . Sexual Activity: Not on file   Other Topics Concern  . Not on file   Social History Narrative   Lives alone in [place].  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  Does not routinely exercise.     Review of Systems General:  No chills, fever, night sweats or weight changes.  Cardiovascular:  No chest pain, orthopnea, palpitations, paroxysmal nocturnal dyspnea. + dyspnea on exertion, edema,  Dermatological: No rash, lesions/masses Respiratory: Severe hacking cough. Mild wheezing Urologic: No hematuria, dysuria Abdominal:   No nausea, vomiting, diarrhea, bright red blood per rectum, melena, or hematemesis Neurologic:  No visual changes, wkns, changes in mental status. All other systems reviewed and are otherwise negative except as noted above.  Physical Exam  Blood pressure 130/98, pulse 124, temperature 101.3 F (38.5 C), temperature source Oral, resp. rate 24, weight  274 lb (124.286 kg), SpO2 95.00%.  General: Pleasant. Severe hacking cough.  Psych: Normal affect. Neuro: Alert and oriented X 3. Moves all extremities spontaneously. HEENT: Normal  Neck: Supple without bruits. Thick hard to see JVD but appears up Lungs:  Poor air movement. Diffuse rhonchi. Mild fine exp wheeze.  Heart: IRR, IRR tachy. No obvious murmurs Abdomen: Obese Soft, non-tender, non-distended, BS + x 4.  Extremities: No clubbing, cyanosis. 1+edema. DP/PT/Radials 2+ and equal bilaterally. Weak on L side due to previous CVA  Labs  Trop i, poc  Lab Results  Component Value Date   WBC 11.6* 09/01/2012   HGB 13.1  09/01/2012   HCT 38.9* 09/01/2012   MCV 86.4 09/01/2012   PLT 296 09/01/2012     Recent Labs Lab 09/01/12 1144  NA 136  K 3.9  CL 99  CO2 23  BUN 16  CREATININE 1.26  CALCIUM 9.0  GLUCOSE 127*   Radiology/Studies  Dg Chest 2 View  09/01/2012   *RADIOLOGY REPORT*  Clinical Data: Shortness of breath.  CHEST - 2 VIEW  Comparison: 08/07/2012.  Findings: Cardiomegaly with a defibrillator/ biventricular pacer in place.  Leads appear in a similar position to the prior exam.  Central pulmonary vascular prominence.  No infiltrate or pneumothorax.  Mild anterior wedging thoracic vertebra unchanged.  IMPRESSION: Cardiomegaly with a defibrillator/ biventricular pacer in place. Leads appear in a similar position to the prior exam.  Central pulmonary vascular prominence.   Original Report Authenticated By: Lacy Duverney, M.D.   ECG: AF with RVR 134. Chronic RBBB   ASSESSMENT AND PLAN 1. Acute bronchitis 2. Acute on chronic systolic HF    --NICM EF 20%. S/p BIVICD 3. Recurrent AF with RVR, s/p DC-CV 7/14 4. Morbid obesity 5. Acute respiratory failure  Signed, Nicolasa Ducking, NP 09/01/2012, 4:05 PM  Patient seen and examined independently. Gilford Raid, NP note reviewed carefully - agree with his assessment and plan. I have edited the note based on my findings.   I  think the major issue here is acute bronchitis which has exacerbated both his AF and CHF. Will treat bronchitis aggressively with abx, steroids, nebs and cough suppressant. Check sputum cx.  For CHF, will give 1 dose IV lasix and then resume home demadex. Suspect we will have a hard time controlling his AF rate until bronchitis is under better control but will start IV amio to help.   Kloee Ballew,MD 4:19 PM

## 2012-09-01 NOTE — ED Provider Notes (Signed)
Medical screening examination/treatment/procedure(s) were performed by non-physician practitioner and as supervising physician I was immediately available for consultation/collaboration.  Raeford Razor, MD 09/01/12 [phone]

## 2012-09-01 NOTE — Telephone Encounter (Signed)
Left message to call back to discuss  Pt has an appt on Tuesday 8/19.

## 2012-09-02 LAB — CBC
HCT: 38 % — ABNORMAL LOW (ref 39.0–52.0)
Hemoglobin: 12.8 g/dL — ABNORMAL LOW (ref 13.0–17.0)
MCHC: 33.7 g/dL (ref 30.0–36.0)
RDW: 15.7 % — ABNORMAL HIGH (ref 11.5–15.5)
WBC: 8.8 10*3/uL (ref 4.0–10.5)

## 2012-09-02 LAB — COMPREHENSIVE METABOLIC PANEL
ALT: 13 U/L (ref 0–53)
Alkaline Phosphatase: 100 U/L (ref 39–117)
BUN: 17 mg/dL (ref 6–23)
CO2: 27 mEq/L (ref 19–32)
Calcium: 9.3 mg/dL (ref 8.4–10.5)
GFR calc Af Amer: 74 mL/min — ABNORMAL LOW (ref >90)
GFR calc non Af Amer: 64 mL/min — ABNORMAL LOW (ref >90)
Glucose, Bld: 165 mg/dL — ABNORMAL HIGH (ref 70–99)
Total Protein: 6.7 g/dL (ref 6.0–8.3)

## 2012-09-02 LAB — TROPONIN I
Troponin I: <0.3 ng/mL (ref <0.30)
Troponin I: <0.3 ng/mL (ref <0.30)

## 2012-09-02 MED ORDER — WARFARIN SODIUM 2.5 MG PO TABS
3.7500 mg | ORAL_TABLET | Freq: Once | ORAL | Status: AC
  Administered 2012-09-02: 3.75 mg via ORAL
  Filled 2012-09-02: qty 1

## 2012-09-02 MED ORDER — ACYCLOVIR 400 MG PO TABS
400.0000 mg | ORAL_TABLET | Freq: Every day | ORAL | Status: DC
  Administered 2012-09-03 – 2012-09-04 (×2): 400 mg via ORAL
  Filled 2012-09-02 (×2): qty 1

## 2012-09-02 NOTE — Progress Notes (Signed)
Primary cardiologist: Dr. [name]  Subjective:   Still with cough and chest congestion, no sense of palpitations. No chest pain.   Objective:   Temp:  [98 F (36.7 C)-101.3 F (38.5 C)] 98.4 F (36.9 C) (08/16 0757) Pulse Rate:  [41-135] 93 (08/16 0400) Resp:  [14-29] 29 (08/16 0400) BP: (110-139)/(67-103) 111/67 mmHg (08/16 0400) SpO2:  [90 %-99 %] 93 % (08/16 0757) Weight:  [270 lb 8.1 oz (122.7 kg)-274 lb (124.286 kg)] 270 lb 8.1 oz (122.7 kg) (08/16 0400)    Filed Weights   09/01/12 1117 09/01/12 2000 09/02/12 0400  Weight: 274 lb (124.286 kg) 272 lb 14.9 oz (123.8 kg) 270 lb 8.1 oz (122.7 kg)    Intake/Output Summary (Last 24 hours) at 09/02/12 0945 Last data filed at 09/02/12 0758  Gross per 24 hour  Intake 692.55 ml  Output   1675 ml  Net -982.45 ml   Telemetry: Atrial fibrillation.  Exam:  General: Obese, no distress.  Lungs: Scattered rhonchi and end expiratory wheeze.  Cardiac: Irregularly irregular, no gallop.  Extremities: No pitting.  Lab Results:  Basic Metabolic Panel:  Recent Labs Lab 09/01/12 1144 09/02/12 0500  NA 136 135  K 3.9 4.5  CL 99 97  CO2 23 27  GLUCOSE 127* 165*  BUN 16 17  CREATININE 1.26 1.29  CALCIUM 9.0 9.3    Liver Function Tests:  Recent Labs Lab 09/02/12 0500  AST 15  ALT 13  ALKPHOS 100  BILITOT 0.8  PROT 6.7  ALBUMIN 3.0*    CBC:  Recent Labs Lab 09/01/12 1144 09/02/12 0500  WBC 11.6* 8.8  HGB 13.1 12.8*  HCT 38.9* 38.0*  MCV 86.4 86.6  PLT 296 253    Cardiac Enzymes:  Recent Labs Lab 09/01/12 1944 09/02/12 0116  TROPONINI <0.30 <0.30    BNP:  Recent Labs  07/26/12 1347 08/07/12 1135 09/01/12 1144  PROBNP 324.0* 4323.0* 3262.0*    Coagulation:  Recent Labs Lab 08/30/12 09/01/12 1237 09/02/12 0500  INR 5.0 2.49* 2.55*    ECG: Atrial fibrillation with intermittent pacing.   Medications:   Scheduled Medications: . acyclovir  800 mg Oral Daily  .  benzonatate  100 mg Oral TID  . digoxin  0.0625 mg Oral QHS  . levalbuterol  0.63 mg Nebulization QID  . levofloxacin  750 mg Oral Daily  . levothyroxine  400 mcg Oral QAC breakfast  . metoprolol  50 mg Oral BID  . potassium chloride SA  40 mEq Oral BID  . predniSONE  40 mg Oral Q breakfast  . sodium chloride  3 mL Intravenous Q12H  . spironolactone  25 mg Oral Daily  . torsemide  20 mg Oral BID  . Warfarin - Pharmacist Dosing Inpatient   Does not apply q1800     Infusions: . amiodarone (NEXTERONE PREMIX) 360 mg/200 mL dextrose 30 mg/hr (09/01/12 2315)     PRN Medications:  sodium chloride, acetaminophen, ALPRAZolam, codeine, guaiFENesin-codeine, guaiFENesin-dextromethorphan, nitroGLYCERIN, ondansetron (ZOFRAN) IV, sodium chloride, zolpidem   Assessment:   1. Recurrent atrial fibrillation with RVR, recent cardioversion in July 2014. Exacerbated potentially by acute episode of bronchitis. Placed on amiodarone at this time.  2. Acute bronchitis.  3. Acute on chronic systolic heart failure, LVEF 20% with NICM, status post BiV-ICD.   Plan/Discussion:    Continue intravenous amiodarone for now. Patient on steroids and antibiotics as well as nebulizer treatments. Hopefully with improvement in respiratory status his atrial fibrillation we be better able  to be rate controlled, although I suspect he may need a repeat DCCV ultimately.   Jonelle Sidle, M.D., F.A.C.C.

## 2012-09-02 NOTE — Progress Notes (Addendum)
ANTICOAGULATION CONSULT NOTE - Follow Up Consult  Pharmacy Consult for Coumadin Indication: atrial fibrillation  Allergies  Allergen Reactions  . Losartan Cough  . Valsartan Cough    Patient Measurements: Height: 5\' 9"  (175.3 cm) Weight: 270 lb 8.1 oz (122.7 kg) IBW/kg (Calculated) : 70.7  Vital Signs: Temp: 98.4 F (36.9 C) (08/16 1156) Temp src: Oral (08/16 1156) BP: 111/67 mmHg (08/16 0400) Pulse Rate: 93 (08/16 0400)  Labs:  Recent Labs  09/01/12 1144 09/01/12 1237 09/01/12 1944 09/02/12 0116 09/02/12 0500 09/02/12 0924  HGB 13.1  --   --   --  12.8*  --   HCT 38.9*  --   --   --  38.0*  --   PLT 296  --   --   --  253  --   LABPROT  --  26.1*  --   --  26.6*  --   INR  --  2.49*  --   --  2.55*  --   CREATININE 1.26  --   --   --  1.29  --   TROPONINI  --   --  <0.30 <0.30  --  <0.30    Estimated Creatinine Clearance: 89.6 ml/min (by C-G formula based on Cr of 1.29).   Medications:  Coumadin 3.75 mg daily except 7.5 mg Sun, Tues, Thurs (new regimen)  Assessment: 52 y/o male on chronic Coumadin for Afib s/p recent DCCV. INR is therapeutic at 2.55. No bleeding noted, CBC is stable. He takes amiodarone at home and is on a drip currently. He has also been started on Levaquin which can increase INR.  Of note, INR was 5 at Coumadin clinic on 8/13. Patient said he was instructed not to take his dose on 8/13 and 8/14. He also reports having diarrhea.  Goal of Therapy:  INR 2-3 Monitor platelets by anticoagulation protocol: Yes   Plan:  -Coumadin 3.75 mg PO tonight -INR daily -Monitor for signs/symptoms of bleeding -MD, free T4 is high and TSH is low - decrease dose of levothyroxine?  [place], [address].D., BCPS Clinical Pharmacist Pager: [phone] 09/02/2012 2:02 PM

## 2012-09-02 NOTE — Progress Notes (Signed)
Nutrition Brief Note  Patient identified on the Malnutrition Screening Tool (MST) Report for eating poorly because of a decreased appetite.  Wt Readings from Last 15 Encounters:  09/02/12 270 lb 8.1 oz (122.7 kg)  08/14/12 279 lb 8 oz (126.78 kg)  08/11/12 275 lb (124.739 kg)  08/11/12 275 lb (124.739 kg)  07/26/12 279 lb (126.554 kg)  07/04/12 278 lb (126.1 kg)  06/25/12 270 lb (122.471 kg)  06/25/12 270 lb (122.471 kg)  06/25/12 270 lb (122.471 kg)  06/25/12 270 lb (122.471 kg)  06/21/12 274 lb (124.286 kg)  06/21/12 274 lb (124.286 kg)  06/14/12 281 lb 6.4 oz (127.642 kg)  05/08/12 278 lb (126.1 kg)  05/04/12 288 lb 4 oz (130.749 kg)    Body mass index is 39.93 kg/(m^2). Patient meets criteria for Obesity Class II based on current BMI.   Patient reports a good appetite. Current diet order is Heart Healthy, patient is consuming approximately 100% of meals at this time. Labs and medications reviewed.   No nutrition interventions warranted at this time. If nutrition issues arise, please consult RD.   Maureen Chatters, RD, LDN Pager #: [phone] After-Hours Pager #: [phone]

## 2012-09-03 LAB — BASIC METABOLIC PANEL
CO2: 22 mEq/L (ref 19–32)
Calcium: 9.5 mg/dL (ref 8.4–10.5)
Creatinine, Ser: 1.38 mg/dL — ABNORMAL HIGH (ref 0.50–1.35)
GFR calc Af Amer: 68 mL/min — ABNORMAL LOW (ref >90)
Sodium: 137 mEq/L (ref 135–145)

## 2012-09-03 LAB — PROTIME-INR
INR: 2.98 — ABNORMAL HIGH (ref 0.00–1.49)
Prothrombin Time: 29.9 seconds — ABNORMAL HIGH (ref 11.6–15.2)

## 2012-09-03 LAB — CBC
Platelets: 294 10*3/uL (ref 150–400)
RBC: 4.58 MIL/uL (ref 4.22–5.81)
RDW: 16.2 % — ABNORMAL HIGH (ref 11.5–15.5)
WBC: 21 10*3/uL — ABNORMAL HIGH (ref 4.0–10.5)

## 2012-09-03 MED ORDER — WARFARIN SODIUM 2.5 MG PO TABS
3.7500 mg | ORAL_TABLET | Freq: Once | ORAL | Status: AC
  Administered 2012-09-03: 3.75 mg via ORAL
  Filled 2012-09-03: qty 1

## 2012-09-03 MED ORDER — METOPROLOL TARTRATE 50 MG PO TABS
50.0000 mg | ORAL_TABLET | Freq: Three times a day (TID) | ORAL | Status: DC
  Administered 2012-09-03 – 2012-09-04 (×4): 50 mg via ORAL
  Filled 2012-09-03 (×6): qty 1

## 2012-09-03 MED ORDER — AMIODARONE HCL 200 MG PO TABS
400.0000 mg | ORAL_TABLET | Freq: Two times a day (BID) | ORAL | Status: DC
  Administered 2012-09-03 – 2012-09-04 (×3): 400 mg via ORAL
  Filled 2012-09-03 (×4): qty 2

## 2012-09-03 NOTE — Progress Notes (Signed)
ANTICOAGULATION CONSULT NOTE - Follow Up Consult  Pharmacy Consult for Coumadin Indication: atrial fibrillation  Allergies  Allergen Reactions  . Losartan Cough  . Valsartan Cough    Patient Measurements: Height: 5\' 9"  (175.3 cm) Weight: 268 lb 15.4 oz (122 kg) IBW/kg (Calculated) : 70.7  Vital Signs: Temp: 97.7 F (36.5 C) (08/17 0300) Temp src: Oral (08/17 0300) BP: 133/90 mmHg (08/17 0433) Pulse Rate: 120 (08/17 0001)  Labs:  Recent Labs  09/01/12 1144 09/01/12 1237 09/01/12 1944 09/02/12 0116 09/02/12 0500 09/02/12 0924 09/03/12 0510  HGB 13.1  --   --   --  12.8*  --  12.9*  HCT 38.9*  --   --   --  38.0*  --  40.3  PLT 296  --   --   --  253  --  294  LABPROT  --  26.1*  --   --  26.6*  --  29.9*  INR  --  2.49*  --   --  2.55*  --  2.98*  CREATININE 1.26  --   --   --  1.29  --  1.38*  TROPONINI  --   --  <0.30 <0.30  --  <0.30  --     Estimated Creatinine Clearance: 83.5 ml/min (by C-G formula based on Cr of 1.38).   Medications:  Coumadin 3.75 mg daily except 7.5 mg Sun, Tues, Thurs (new regimen)  Assessment: 52 y/o male on chronic Coumadin for Afib s/p recent DCCV. INR is therapeutic at 2.98. No bleeding noted, CBC is stable. He takes amiodarone at home and is on a drip currently. He has also been started on Levaquin which can increase INR. Will give lower than normal dose for today since INR may be trending up.  Of note, INR was 5 at Coumadin clinic on 8/13. Patient said he was instructed not to take his dose on 8/13 and 8/14. He also reports having diarrhea.  Goal of Therapy:  INR 2-3 Monitor platelets by anticoagulation protocol: Yes   Plan:  -Coumadin 3.75 mg PO tonight -INR daily -Monitor for signs/symptoms of bleeding -MD, free T4 is high and TSH is low - decrease dose of levothyroxine?  [place], [address].D., BCPS Clinical Pharmacist Pager: [phone] 09/03/2012 7:59 AM

## 2012-09-03 NOTE — Progress Notes (Signed)
Primary cardiologist: Dr. [name]  Subjective:   Cough somewhat better, still present with mild wheeze. No chest pain symptoms.   Objective:   Temp:  [97.5 F (36.4 C)-98.5 F (36.9 C)] 97.7 F (36.5 C) (08/17 0817) Pulse Rate:  [96-122] 122 (08/17 0817) Resp:  [23-29] 23 (08/17 0817) BP: (86-133)/(56-100) 122/100 mmHg (08/17 0817) SpO2:  [95 %-98 %] 96 % (08/17 0817) Weight:  [268 lb 15.4 oz (122 kg)] 268 lb 15.4 oz (122 kg) (08/17 0435)    Filed Weights   09/01/12 2000 09/02/12 0400 09/03/12 0435  Weight: 272 lb 14.9 oz (123.8 kg) 270 lb 8.1 oz (122.7 kg) 268 lb 15.4 oz (122 kg)    Intake/Output Summary (Last 24 hours) at 09/03/12 0931 Last data filed at 09/03/12 0600  Gross per 24 hour  Intake  773.9 ml  Output    950 ml  Net -176.1 ml   Telemetry: Atrial fibrillation with RVR.  Exam:  General: Obese, no distress.  Lungs: Scattered rhonchi and end expiratory wheeze.  Cardiac: Irregularly irregular, no gallop.  Extremities: No pitting.  Lab Results:  Basic Metabolic Panel:  Recent Labs Lab 09/01/12 1144 09/02/12 0500 09/03/12 0510  NA 136 135 137  K 3.9 4.5 4.2  CL 99 97 98  CO2 23 27 22   GLUCOSE 127* 165* 153*  BUN 16 17 24*  CREATININE 1.26 1.29 1.38*  CALCIUM 9.0 9.3 9.5    Liver Function Tests:  Recent Labs Lab 09/02/12 0500  AST 15  ALT 13  ALKPHOS 100  BILITOT 0.8  PROT 6.7  ALBUMIN 3.0*    CBC:  Recent Labs Lab 09/01/12 1144 09/02/12 0500 09/03/12 0510  WBC 11.6* 8.8 21.0*  HGB 13.1 12.8* 12.9*  HCT 38.9* 38.0* 40.3  MCV 86.4 86.6 88.0  PLT 296 253 294    Cardiac Enzymes:  Recent Labs Lab 09/01/12 1944 09/02/12 0116 09/02/12 0924  TROPONINI <0.30 <0.30 <0.30    BNP:  Recent Labs  07/26/12 1347 08/07/12 1135 09/01/12 1144  PROBNP 324.0* 4323.0* 3262.0*    Coagulation:  Recent Labs Lab 09/01/12 1237 09/02/12 0500 09/03/12 0510  INR 2.49* 2.55* 2.98*    ECG: Atrial fibrillation with  RBBB and LAFB.Marland Kitchen   Medications:   Scheduled Medications: . acyclovir  400 mg Oral Daily  . benzonatate  100 mg Oral TID  . digoxin  0.0625 mg Oral QHS  . levalbuterol  0.63 mg Nebulization QID  . levofloxacin  750 mg Oral Daily  . levothyroxine  400 mcg Oral QAC breakfast  . metoprolol  50 mg Oral BID  . potassium chloride SA  40 mEq Oral BID  . predniSONE  40 mg Oral Q breakfast  . sodium chloride  3 mL Intravenous Q12H  . spironolactone  25 mg Oral Daily  . torsemide  20 mg Oral BID  . warfarin  3.75 mg Oral ONCE-1800  . Warfarin - Pharmacist Dosing Inpatient   Does not apply q1800    Infusions: . amiodarone (NEXTERONE PREMIX) 360 mg/200 mL dextrose 30 mg/hr (09/02/12 2031)    PRN Medications: sodium chloride, acetaminophen, ALPRAZolam, codeine, guaiFENesin-codeine, guaiFENesin-dextromethorphan, nitroGLYCERIN, ondansetron (ZOFRAN) IV, sodium chloride, zolpidem   Assessment:   1. Recurrent/persistent atrial fibrillation with RVR, recent cardioversion in July 2014. Exacerbated potentially by acute episode of bronchitis. Placed on IV amiodarone at this time, was on oral amiodarone at home.  2. Acute bronchitis. On nebulizers, steroids, and antibiotics. Leukocytosis likely secondary to steroids. He is afebrile.  3. Acute on chronic systolic heart failure, LVEF 20% with NICM, status post BiV-ICD.   Plan/Discussion:    Patient wants to go home, heart rate is still not well-controlled, still having coughing with his bronchitis. Plan is to discontinue IV amiodarone, increase his previous oral standing dose, increase beta blocker, and continue treatment for acute bronchitis. Transfer to telemetry. I suspect he will ultimately need another DCCV, although may be able to be deferred until after his bronchitis settles down.   Jonelle Sidle, M.D., F.A.C.C.

## 2012-09-03 NOTE — Progress Notes (Signed)
Pt requested to see nurse.  Upon entering pt room, pt states he had a coughing spell while sitting in chair and passed out.  He states before he came to he was yelling for help.  Pt alert, oriented, skin warm with stable vital signs.  Dr. Donnie Aho paged and awaiting return call.

## 2012-09-04 ENCOUNTER — Encounter (HOSPITAL_COMMUNITY): Payer: Self-pay | Admitting: Physician Assistant

## 2012-09-04 LAB — PROTIME-INR
INR: 3.32 — ABNORMAL HIGH (ref 0.00–1.49)
Prothrombin Time: 32.5 seconds — ABNORMAL HIGH (ref 11.6–15.2)

## 2012-09-04 LAB — CBC
Hemoglobin: 12.9 g/dL — ABNORMAL LOW (ref 13.0–17.0)
MCH: 28.1 pg (ref 26.0–34.0)
Platelets: 294 10*3/uL (ref 150–400)
RBC: 4.59 MIL/uL (ref 4.22–5.81)
WBC: 15.6 10*3/uL — ABNORMAL HIGH (ref 4.0–10.5)

## 2012-09-04 LAB — BASIC METABOLIC PANEL
Calcium: 10 mg/dL (ref 8.4–10.5)
GFR calc Af Amer: 60 mL/min — ABNORMAL LOW (ref >90)
GFR calc non Af Amer: 52 mL/min — ABNORMAL LOW (ref >90)
Glucose, Bld: 126 mg/dL — ABNORMAL HIGH (ref 70–99)
Potassium: 4.6 mEq/L (ref 3.5–5.1)
Sodium: 140 mEq/L (ref 135–145)

## 2012-09-04 MED ORDER — LEVOFLOXACIN 750 MG PO TABS: 750.0000 mg | ORAL_TABLET | Freq: Every day | ORAL | Status: DC

## 2012-09-04 MED ORDER — METOPROLOL TARTRATE 100 MG PO TABS: 100.0000 mg | ORAL_TABLET | Freq: Two times a day (BID) | ORAL | Status: DC

## 2012-09-04 MED ORDER — METOPROLOL TARTRATE 50 MG PO TABS
100.0000 mg | ORAL_TABLET | Freq: Two times a day (BID) | ORAL | Status: DC
Start: 2012-09-04 — End: 2013-03-14

## 2012-09-04 MED ORDER — METOPROLOL TARTRATE 1 MG/ML IV SOLN: 2.5000 mg | INTRAVENOUS | Status: DC | PRN

## 2012-09-04 MED ORDER — LOSARTAN POTASSIUM 50 MG PO TABS: 25.0000 mg | ORAL_TABLET | Freq: Every day | ORAL | Status: DC

## 2012-09-04 MED ORDER — BENZONATATE 100 MG PO CAPS
200.0000 mg | ORAL_CAPSULE | Freq: Three times a day (TID) | ORAL | Status: DC
  Filled 2012-09-04 (×2): qty 2

## 2012-09-04 MED ORDER — WARFARIN SODIUM 7.5 MG PO TABS: 3.7500 mg | ORAL_TABLET | Freq: Every day | ORAL | Status: DC

## 2012-09-04 MED ORDER — SPIRONOLACTONE 25 MG PO TABS: ORAL_TABLET | ORAL | Status: DC

## 2012-09-04 MED ORDER — ALPRAZOLAM 0.5 MG PO TABS: 0.5000 mg | ORAL_TABLET | Freq: Three times a day (TID) | ORAL | Status: DC | PRN

## 2012-09-04 MED ORDER — PREDNISONE 20 MG PO TABS: ORAL_TABLET | ORAL | Status: DC

## 2012-09-04 MED ORDER — WARFARIN SODIUM 2 MG PO TABS
2.0000 mg | ORAL_TABLET | Freq: Once | ORAL | Status: AC
  Administered 2012-09-04: 2 mg via ORAL
  Filled 2012-09-04: qty 1

## 2012-09-04 MED ORDER — BENZONATATE 200 MG PO CAPS: 200.0000 mg | ORAL_CAPSULE | Freq: Three times a day (TID) | ORAL | Status: DC

## 2012-09-04 NOTE — Progress Notes (Signed)
Patient Name: Raymond [name] Date of Encounter: 09/04/2012  Principal Problem:   Acute bronchitis Active Problems:   Acute on chronic systolic CHF (congestive heart failure)   Atrial fibrillation with rapid ventricular response   Acute respiratory failure    SUBJECTIVE: Coughs when he gets upset, throat is dry. No sputum. Admits he is upset due to the way things have gone today. At home, also gets upset. Stays by himself a lot to help keep him from getting upset.  He wants d/c today. Thinks his volume status is OK, does not know dry weight. Thinks he would be better off at home. Has not eaten today because of being upset. Vomited x 1 due to emotional stress. He does this sometimes.   OBJECTIVE Filed Vitals:   09/04/12 0700 09/04/12 1044 09/04/12 1125 09/04/12 1147  BP: 115/74 125/56 107/92   Pulse: 103 118 52   Temp: 97.8 F (36.6 C)  97.6 F (36.4 C)   TempSrc:      Resp: 20  20   Height:      Weight:      SpO2: 97%  99% 93%    Intake/Output Summary (Last 24 hours) at 09/04/12 1502 Last data filed at 09/04/12 0500  Gross per 24 hour  Intake    360 ml  Output   1600 ml  Net  -1240 ml   Filed Weights   09/02/12 0400 09/03/12 0435 09/03/12 1201  Weight: 270 lb 8.1 oz (122.7 kg) 268 lb 15.4 oz (122 kg) 268 lb 15.4 oz (122 kg)    PHYSICAL EXAM General: Well developed, well nourished, male in no acute distress. Head: Normocephalic, atraumatic.  Neck: Supple without bruits, JVD not elevated. Lungs:  Resp regular and unlabored, wheezy cough, slight exp wheeze at rest, rales bases. Heart: rapid irregular HR, S1, S2, no S3, S4, or murmur; no rub. Abdomen: Soft, non-tender, non-distended, BS + x 4.  Extremities: No clubbing, cyanosis, trace-1+ edema.  Neuro: Alert and oriented X 3. Moves all extremities spontaneously. Psych: Normal affect.  LABS: CBC:  Recent Labs  09/03/12 0510 09/04/12 0522  WBC 21.0* 15.6*  HGB 12.9* 12.9*  HCT 40.3 40.6  MCV 88.0 88.5  PLT  294 294   INR:  Recent Labs  09/04/12 0522  INR 3.32*   Basic Metabolic Panel:  Recent Labs  75/64/33 0510 09/04/12 0522  NA 137 140  K 4.2 4.6  CL 98 101  CO2 22 29  GLUCOSE 153* 126*  BUN 24* 33*  CREATININE 1.38* 1.52*  CALCIUM 9.5 10.0   Liver Function Tests:  Recent Labs  09/02/12 0500  AST 15  ALT 13  ALKPHOS 100  BILITOT 0.8  PROT 6.7  ALBUMIN 3.0*   Cardiac Enzymes:  Recent Labs  09/01/12 1944 09/02/12 0116 09/02/12 0924  TROPONINI <0.30 <0.30 <0.30   BNP: Pro B Natriuretic peptide (BNP)  Date/Time Value Range Status  09/01/2012 11:44 AM 3262.0* 0 - 125 pg/mL Final  08/07/2012 11:35 AM 4323.0* 0 - 125 pg/mL Final   Thyroid Function Tests:  09/01/12  1944  TSH 0.009*  FREE T4 .3.08*   TELE:   A. Fib, RVR; had 10 bt run VT, no sx. Also with fusion beats.  Radiology/Studies: Dg Chest 2 View 09/01/2012   *RADIOLOGY REPORT*  Clinical Data: Shortness of breath.  CHEST - 2 VIEW  Comparison: 08/07/2012.  Findings: Cardiomegaly with a defibrillator/ biventricular pacer in place.  Leads appear in a similar position to  the prior exam.  Central pulmonary vascular prominence.  No infiltrate or pneumothorax.  Mild anterior wedging thoracic vertebra unchanged.  IMPRESSION: Cardiomegaly with a defibrillator/ biventricular pacer in place. Leads appear in a similar position to the prior exam.  Central pulmonary vascular prominence.   Original Report Authenticated By: Lacy Duverney, M.D.    Current Medications:  . acyclovir  400 mg Oral Daily  . amiodarone  400 mg Oral BID  . benzonatate  200 mg Oral TID  . digoxin  0.0625 mg Oral QHS  . levalbuterol  0.63 mg Nebulization QID  . levofloxacin  750 mg Oral Daily  . levothyroxine  400 mcg Oral QAC breakfast  . metoprolol  50 mg Oral TID  . potassium chloride SA  40 mEq Oral BID  . predniSONE  40 mg Oral Q breakfast  . sodium chloride  3 mL Intravenous Q12H  . spironolactone  25 mg Oral Daily  . torsemide  20  mg Oral BID  . Warfarin - Pharmacist Dosing Inpatient   Does not apply q1800      ASSESSMENT AND PLAN: Principal Problem:   Acute bronchitis - wheezes quite a bit when coughing, but agitation level is high (contributes to cough). Consider tapering the steroids, will increase benzonatate, encouraged pt to ask for PRN meds for cough.   Active Problems:   Acute on chronic systolic CHF (congestive heart failure) - volume status is OK now. His weight is the lowest it's been in since 2012. Continue torsemide at his home dose. Not on metolazone, will leave as PRN med.    Atrial fibrillation with rapid ventricular response - agree with continuing amio at higher dose for now. Will need early f/u and dose management.    Acute respiratory failure - secondary to #1 & 2, improved.    Chronic anticoagulation - would not hold coumadin completely tonight, will discuss with pharmacy.  I Plan - d/c when medically stable.   Signed, Theodore Demark , PA-C 3:02 PM 09/04/2012 Iatrogenic hyperthyroidism,  Possible related to variable compliance with synthroid  TSH has become nearly undetectable in last two months and T4>3  Spoke with dr George Hugh who recommended discontinuation of synthryoid   And he will followup patient in the office  Until this is corrected, DCCV of little value  Will increase betablocker in the interim to help control ventricular response>>lopressor 100 bid

## 2012-09-04 NOTE — Discharge Summary (Signed)
Discharge Summary   Patient ID: Raymond [name] MRN: [id number], DOB/AGE: 1960-09-15 52 y.o. Admit date: 09/01/2012 D/C date:     09/04/2012  Primary Cardiologist: Hochrein/[facility]  Primary Discharge Diagnoses:  1. Acute bronchitis 2. Acute on chronic systolic CHF/NICM EF 20% s/p [employer] BiV-ICD 3. Recurrent atrial fibrillation with rapid ventricular response - s/p TEE/DCCV 07/2012 - abnormal thyroid function this admission 4. Acute respiratory failure 5. Morbid obesity 6. Hypothyroidism s/p RAI therapy  - abnormal TFTs indicating hyperthyroidism this admission - synthroid discontinued as below 7. Mild acute renal insufficiency 8. Cough syncope  Secondary Discharge Diagnoses:  1. HTN 2. RBBB 3. Obesity 4. Noncompliance 5. Dyslexia 6. 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 He also has been dealing with recurrent PAF and most recently is s/p TEE and DCCV in July. He was d/c'd home in amiodarone.He was doing well until about 2 weeks ago when he developed severe cough productive of whitish sputum. He had been coughing so bad that he could not sleep and also his ribs hurt. HHRN noticed his pulse was fast. He reported stable weight but mild edema. CXR showed no edema, pBNP was elevated from baseline. He was febrile in the ER with temp of 101 and ECG showing rapid atrial fibrillation. He was felt to have acute bronchitis and was started on antibiotics, steroids, nebs, and a cough suppressant. Given concern for CHF, he was treated with IV Lasix then converted back to his home Demadex. It was suspected his AF rate would be difficult to control until bronchitis was under better control, thus IV amiodarone was started to assist. Coumadin was continued.  Troponins remained negative. The patient developed a leukocytosis that was felt due to steroids. By 09/03/12, his amiodarone was able to be changed back to oral formulation. He had a brief episode of syncope without associated arrhythmia that was deemed to be cough syncope per discussion with Dr. Graciela Husbands. Ultimately, the patient's thyroid function was noted to be abnormal with TSH of 0.009 and free T4 of 3.08. Dr. Graciela Husbands spoke with Dr. Everardo All who recommended discontinuation of Synthroid and he will follow up with the patient in his office. Until this is corrected, DCCV will likely be of little value. Today he is feeling better. His weight is the lowest it's been since 2012 at 268lb.   Per discussion with Dr. Graciela Husbands, the following med changes were recommended at discharge: - resume ARB at 25mg  daily (1/2 dose of home, was held on admission). If cough still present at follow-up could discontinue completely. - decrease spironolactone to 12.5mg  daily since we are restarting ARB - titrate lopressor to 100mg  BID given hyperthyroid state - continue Levaquin to complete 7 day course (3 days left at discharge) - taper prednisone quickly - keep amiodarone dose at home dose  Given med changes above as well as mild renal insufficiency, will recheck BMET at time of his next INR check which will be this week. His INR will need to be monitored carefully given Levaquin use. Per discussion with pharmacy, will give 2mg  Coumadin tonight in-house and discharge with Coumadin dose as below (dropping down 7.5mg  dose to 2x/week rather than 3x/week).Dr. Graciela Husbands has  seen and examined the patient today and feels he is stable for discharge.   Discharge Vitals: Blood pressure 107/92, pulse 52, temperature 97.6 F (36.4 C), temperature source Oral, resp. rate 20, height 5\' 5"  (1.651 m), weight 268 lb 15.4 oz (122 kg), SpO2 93.00%.  Labs: Lab Results  Component Value Date   WBC 15.6* 09/04/2012   HGB 12.9* 09/04/2012   HCT 40.6  09/04/2012   MCV 88.5 09/04/2012   PLT 294 09/04/2012    Recent Labs Lab 09/02/12 0500  09/04/12 0522  NA 135  < > 140  K 4.5  < > 4.6  CL 97  < > 101  CO2 27  < > 29  BUN 17  < > 33*  CREATININE 1.29  < > 1.52*  CALCIUM 9.3  < > 10.0  PROT 6.7  --   --   BILITOT 0.8  --   --   ALKPHOS 100  --   --   ALT 13  --   --   AST 15  --   --   GLUCOSE 165*  < > 126*  < > = values in this interval not displayed.  Recent Labs  09/01/12 1944 09/02/12 0116 09/02/12 0924  TROPONINI <0.30 <0.30 <0.30    Diagnostic Studies/Procedures   Dg Chest 2 View 09/01/2012   *RADIOLOGY REPORT*  Clinical Data: Shortness of breath.  CHEST - 2 VIEW  Comparison: 08/07/2012.  Findings: Cardiomegaly with a defibrillator/ biventricular pacer in place.  Leads appear in a similar position to the prior exam.  Central pulmonary vascular prominence.  No infiltrate or pneumothorax.  Mild anterior wedging thoracic vertebra unchanged.  IMPRESSION: Cardiomegaly with a defibrillator/ biventricular pacer in place. Leads appear in a similar position to the prior exam.  Central pulmonary vascular prominence.   Original Report Authenticated By: Lacy Duverney, M.D.   Dg Chest 2 View 08/07/2012   *RADIOLOGY REPORT*  Clinical Data: Shortness of breath and weakness for 2 days, light chest pain yesterday  CHEST - 2 VIEW  Comparison: 06/22/2012  Findings: Left subclavian pacemaker / AICD leads project over right atrium, right ventricle and coronary sinus. Enlargement of cardiac silhouette with pulmonary vascular congestion. Mediastinal contours normal. No acute infiltrate, pleural effusion or pneumothorax. Bones unremarkable.  IMPRESSION: Enlargement of cardiac silhouette with pulmonary vascular congestion post pacemaker and AICD. No acute abnormalities.   Original Report Authenticated By: Ulyses Southward, M.D.    Discharge Medications     Medication List    STOP taking these medications       furosemide 80 MG tablet  Commonly known  as:  LASIX     levothyroxine 200 MCG tablet  Commonly known as:  SYNTHROID, LEVOTHROID      TAKE these medications       acyclovir 800 MG tablet  Commonly known as:  ZOVIRAX  Take 400 mg by mouth daily.     ALPRAZolam 0.25 MG tablet  Commonly known as:  XANAX  Take 0.25 mg by mouth at bedtime as needed for sleep.     amiodarone 200 MG tablet  Commonly known as:  PACERONE  Take 1 tablet (200 mg total) by mouth 2 (two) times daily.     benzonatate 200 MG capsule  Commonly known as:  TESSALON  Take 1 capsule (200 mg total) by mouth 3 (three) times daily.     digoxin 0.125 MG tablet  Commonly known as:  LANOXIN  Take 0.0625 mg by mouth at bedtime.  levofloxacin 750 MG tablet  Commonly known as:  LEVAQUIN  Take 1 tablet (750 mg total) by mouth daily.     losartan 50 MG tablet  Commonly known as:  COZAAR  Take 0.5 tablets (25 mg total) by mouth daily.     metolazone 2.5 MG tablet  Commonly known as:  ZAROXOLYN  Take 2.5 mg by mouth daily.     metoprolol 50 MG tablet  Commonly known as:  LOPRESSOR  Take 2 tablets (100 mg total) by mouth 2 (two) times daily.     potassium chloride SA 20 MEQ tablet  Commonly known as:  K-DUR,KLOR-CON  Take 2 tablets (40 mEq total) by mouth 2 (two) times daily.     predniSONE 20 MG tablet  Commonly known as:  DELTASONE  By mouth. Take 1 1/2 tablets daily x 2 days, then 1 tablet daily x 2 days, then 1/2 tablet daily x 2 days then stop.     spironolactone 25 MG tablet  Commonly known as:  ALDACTONE  Take 1/2 tablet (12.5mg ) by mouth daily.     torsemide 20 MG tablet  Commonly known as:  DEMADEX  Take 1 tablet (20 mg total) by mouth 2 (two) times daily.     warfarin 7.5 MG tablet  Commonly known as:  COUMADIN  Take 0.5-1 tablets (3.75-7.5 mg total) by mouth daily. Take 3.75mg  (1/2 tablet) daily except 7.5mg  (1 tablet) on Wednesdays and Sundays.        Disposition   The patient will be discharged in stable condition to  home. Discharge Orders   Future Appointments Provider Department Dept Phone   09/07/2012 8:45 AM [facility] Main Office [place]) [phone]   09/07/2012 9:00 AM [facility] [phone]   09/11/2012 1:00 PM Romero Belling, MD [facility] ENDO[facility] [phone]   09/20/2012 9:30 AM Dyann Kief, PA-C [facility] Main Office [place]) [phone]   10/25/2012 3:00 PM Romero Belling, MD [facility] ENDOCRINOLOGY [phone]   Future Orders Complete By Expires   Diet - low sodium heart healthy  As directed    Increase activity slowly  As directed      Follow-up Information   Follow up with [facility]. (Please have home health draw labwork (BMET) and Coumadin level (INR) on 09/07/12. Call INR to [facility]. BMET results should go to Dr. Antoine Poche.)    Contact information:   [address] [place] [address]       Follow up with Romero Belling, MD. (09/11/12 at 1pm)    Specialty:  Endocrinology   Contact information:   [address] [place] [address] [phone]       Follow up with Jacolyn Reedy, PA-C. (09/20/12 at 9:30am)    Specialty:  Cardiology   Contact information:   [address] [place], [place] [address] [place] [address] [phone]         Duration of Discharge Encounter: Greater than 30 minutes including physician and PA time.  Signed, Ronie Spies PA-C 09/04/2012, 5:21 PM

## 2012-09-04 NOTE — Care Management (Signed)
Case Manager did speak to pt in reference to [facility] services and he is active with [facility] for R for lab draws. CM did call [facility] to verify and they will contact pt for lab draws. No further needs from CM at this time. Gala Lewandowsky, RN,BSN [phone]

## 2012-09-04 NOTE — Progress Notes (Signed)
Utilization review completed.  

## 2012-09-04 NOTE — Progress Notes (Signed)
Dr. Donnie Aho text paged of episode also.  Pt currently resting with heart rate 99 and no complaints.  Will continue to monitor.

## 2012-09-04 NOTE — Progress Notes (Signed)
Pt discharged to home per MD order. Pt and pt's girlfirend received and reviewed all discharge instructions and medication information including follow-up appointments and prescriptions. Pt and girlfriend verbalized understanding. Pt alert and oriented at discharge with no complaints of pain. Pt escorted to private vehicle via wheelchair by nurse tech. Joylene Grapes

## 2012-09-04 NOTE — Progress Notes (Signed)
ANTICOAGULATION CONSULT NOTE - Follow Up Consult  Pharmacy Consult for Coumadin Indication: atrial fibrillation  Allergies  Allergen Reactions  . Losartan Cough  . Valsartan Cough    Patient Measurements: Height: 5\' 5"  (165.1 cm) Weight: 268 lb 15.4 oz (122 kg) IBW/kg (Calculated) : 61.5  Vital Signs: Temp: 97.8 F (36.6 C) (08/18 0700) Temp src: Oral (08/18 0400) BP: 115/74 mmHg (08/18 0700) Pulse Rate: 103 (08/18 0700)  Labs:  Recent Labs  09/01/12 1944 09/02/12 0116 09/02/12 0500 09/02/12 0924 09/03/12 0510 09/04/12 0522  HGB  --   --  12.8*  --  12.9* 12.9*  HCT  --   --  38.0*  --  40.3 40.6  PLT  --   --  253  --  294 294  LABPROT  --   --  26.6*  --  29.9* 32.5*  INR  --   --  2.55*  --  2.98* 3.32*  CREATININE  --   --  1.29  --  1.38* 1.52*  TROPONINI <0.30 <0.30  --  <0.30  --   --     Estimated Creatinine Clearance: 71.3 ml/min (by C-G formula based on Cr of 1.52).  Assessment: 51yom continues on coumadin for afib. INR is now supratherapeutic - likely due to drug interactions with levaquin and amiodarone (both po). CBC is stable. No bleeding reported.  Home dose: 3.75mg  daily except 7.5mg  on Sun, Tue, Thurs  Goal of Therapy:  INR 2-3 Monitor platelets by anticoagulation protocol: Yes   Plan:  1) No coumadin tonight 2) INR in AM  Fredrik Rigger 09/04/2012,10:17 AM

## 2012-09-04 NOTE — Progress Notes (Signed)
Late Entry - Pt had 10 bts v tach on tele this am while sleeping in chair.  VSS on awakening, without c/o.

## 2012-09-05 ENCOUNTER — Ambulatory Visit: Payer: Medicare Other | Admitting: Cardiology

## 2012-09-05 ENCOUNTER — Encounter: Payer: Medicare Other | Admitting: Internal Medicine

## 2012-09-05 NOTE — Telephone Encounter (Signed)
Pt has an appt today.  

## 2012-09-07 ENCOUNTER — Other Ambulatory Visit: Payer: Medicare Other

## 2012-09-07 ENCOUNTER — Ambulatory Visit (INDEPENDENT_AMBULATORY_CARE_PROVIDER_SITE_OTHER): Payer: Medicare Other | Admitting: Cardiology

## 2012-09-07 ENCOUNTER — Encounter: Payer: Self-pay | Admitting: Cardiology

## 2012-09-07 DIAGNOSIS — I4891 Unspecified atrial fibrillation: Secondary | ICD-10-CM

## 2012-09-07 DIAGNOSIS — Z7901 Long term (current) use of anticoagulants: Secondary | ICD-10-CM

## 2012-09-07 LAB — POCT INR: INR: 3.2

## 2012-09-11 ENCOUNTER — Encounter: Payer: Self-pay | Admitting: Endocrinology

## 2012-09-11 ENCOUNTER — Ambulatory Visit (INDEPENDENT_AMBULATORY_CARE_PROVIDER_SITE_OTHER): Payer: Medicare Other | Admitting: Endocrinology

## 2012-09-11 VITALS — BP 130/70 | HR 100 | Ht 69.0 in | Wt 284.0 lb

## 2012-09-11 DIAGNOSIS — E890 Postprocedural hypothyroidism: Secondary | ICD-10-CM

## 2012-09-11 NOTE — Patient Instructions (Addendum)
blood tests are being requested for you today.  We'll contact you with results. Our plan will be to let the blood test come back up to normal, then resume the levothyroxine at a lower dosage.

## 2012-09-11 NOTE — Progress Notes (Signed)
Subjective:    Patient ID: Raymond [name], male    DOB: 17-Feb-1960, 52 y.o.   MRN: [id number]  HPI Pt states he was dx'ed with hyperthyroidism in 1999. He describes what sounds like i-131 rx, in approx 2002. He has been on amiodarone since approx 2004.  He has required serial adjustments in his synthroid over the past few years.  He requires a very high dosage of synthroid, probably also due to amiodarone.  He was hospitalized last week with AF, and TSH was suppressed.  Synthroid was stopped.  pt states he feels well in general, except for fatigue.  He says he was taking the synthroid (2x200 mcg/day) exactly as rx'ed.  He still has slight palpitations in the chest, and assoc fatigue.  Past Medical History  Diagnosis Date  . Systolic CHF, chronic   . Non-ischemic cardiomyopathy     a. echo 11/10: EF 15%;   b. cath 10/08: normal cors;  c. Myoview 5/12: Very mild distal ant and apical isch, EF 17% (low risk-medical Rx cont'd);  d.  Echo 4/14:  EF 15%;  e. 05/2009 s/p SJM BiV ICD;  f. 07/2012 TEE: EF 20-25%, diff HK mild MR, mod TR.   Marland Kitchen Atrial fibrillation     a. on coumadin;  b. 07/2012 s/p TEE/DCCV. c. Recurrence 08/2012 in setting of abnormal thyroid panel.  . Hypertension   . Hypothyroidism     s/p RAI therapy  . Obesity   . Non-compliance   . Dyslexia   . 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 RBBB (right bundle branch block)   . 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 Automatic implantable cardioverter-defibrillator in situ   . Cough syncope     a. During 08/2012 adm.    Past Surgical History  Procedure Laterality Date  . Insert / replace / remove pacemaker  06/11/09    [employer] UNIFY [id number] BIVENTRICULAR AICD SERIAL [phone]  . Knee arthroscopy Left ~ 1995  . Cardiac catheterization      "several time" (09/01/2012)  . Tee without cardioversion N/A 06/23/2012    Procedure: TRANSESOPHAGEAL ECHOCARDIOGRAM  (TEE);  Surgeon: Vesta Mixer, MD;  Location: [facility] ENDOSCOPY;  Service: Cardiovascular;  Laterality: N/A;  TEE will be done at 0730   . Cardioversion N/A 07/27/2012    Procedure: CARDIOVERSION;  Surgeon: Vesta Mixer, MD;  Location: [facility] ENDOSCOPY;  Service: Cardiovascular;  Laterality: N/A;  . Tee without cardioversion N/A 08/10/2012    Procedure: TRANSESOPHAGEAL ECHOCARDIOGRAM (TEE);  Surgeon: Wendall Stade, MD;  Location: [facility] ENDOSCOPY;  Service: Cardiovascular;  Laterality: N/A;  . Cardioversion N/A 08/10/2012    Procedure: CARDIOVERSION;  Surgeon: Wendall Stade, MD;  Location: [facility] ENDOSCOPY;  Service: Cardiovascular;  Laterality: N/A;  . Sp pta add intra cran  11/2008    a. 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 Marital Status: Legally Separated    Spouse Name: N/A    Number of Children: 0  . Years of Education: N/A   Occupational History  . DISABLED     HOWEVER, HE DOES WORK A LITTLE ON CARS   Social History Main Topics  . Smoking status: Never Smoker   . Smokeless tobacco: Never Used  . Alcohol Use: No  . Drug Use: No  . Sexual Activity: Not Currently  Other Topics Concern  . Not on file   Social History Narrative   Lives alone in [place].  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  Does not routinely exercise.    Current Outpatient Prescriptions on File Prior to Visit  Medication Sig Dispense Refill  . acyclovir (ZOVIRAX) 800 MG tablet Take 400 mg by mouth daily.      Marland Kitchen ALPRAZolam (XANAX) 0.25 MG tablet Take 0.25 mg by mouth at bedtime as needed for sleep.      Marland Kitchen amiodarone (PACERONE) 200 MG tablet Take 1 tablet (200 mg total) by mouth 2 (two) times daily.  60 tablet  5  . benzonatate (TESSALON) 200 MG capsule Take 1 capsule (200 mg total) by mouth 3 (three) times daily.  20 capsule  0  . digoxin (LANOXIN) 0.125 MG tablet Take 0.0625 mg by mouth at bedtime.       Marland Kitchen losartan (COZAAR) 50 MG tablet Take  0.5 tablets (25 mg total) by mouth daily.      . metolazone (ZAROXOLYN) 2.5 MG tablet Take 2.5 mg by mouth daily.       . metoprolol (LOPRESSOR) 50 MG tablet Take 2 tablets (100 mg total) by mouth 2 (two) times daily.      . potassium chloride SA (K-DUR,KLOR-CON) 20 MEQ tablet Take 2 tablets (40 mEq total) by mouth 2 (two) times daily.  120 tablet  11  . torsemide (DEMADEX) 20 MG tablet Take 1 tablet (20 mg total) by mouth 2 (two) times daily.  60 tablet  11  . warfarin (COUMADIN) 7.5 MG tablet Take 0.5-1 tablets (3.75-7.5 mg total) by mouth daily. Take 3.75mg  (1/2 tablet) daily except 7.5mg  (1 tablet) on Wednesdays and Sundays.       No current facility-administered medications on file prior to visit.    Allergies  Allergen Reactions  . Losartan Cough  . Valsartan Cough    Family History  Problem Relation Age of Onset  . Heart disease Father     BP 130/70  Pulse 100  Ht 5\' 9"  (1.753 m)  Wt 284 lb (128.822 kg)  BMI 41.92 kg/m2  SpO2 98%  Review of Systems He has anxiety.  He denies fever    Objective:   Physical Exam VITAL SIGNS:  See vs page GENERAL: no distress NECK: There is no palpable thyroid enlargement.  No thyroid nodule is palpable.  No palpable lymphadenopathy at the anterior neck.    Lab Results  Component Value Date   TSH 0.20* 09/11/2012   T3TOTAL 64.1* 07/30/2009   T4TOTAL 5.7 07/23/2009      Assessment & Plan:  Post-i-131 hypothyroidism, overreplaced.   AF: in this setting, we should keep his TSH normal Amiodarone: this causes a need for a high dosage of synthroid.  It may also account for his fluctuation dosage requirement.

## 2012-09-12 ENCOUNTER — Ambulatory Visit (INDEPENDENT_AMBULATORY_CARE_PROVIDER_SITE_OTHER): Payer: Medicare Other | Admitting: Pharmacist

## 2012-09-12 ENCOUNTER — Telehealth: Payer: Self-pay | Admitting: Cardiology

## 2012-09-12 DIAGNOSIS — Z7901 Long term (current) use of anticoagulants: Secondary | ICD-10-CM

## 2012-09-12 DIAGNOSIS — I4891 Unspecified atrial fibrillation: Secondary | ICD-10-CM

## 2012-09-12 MED ORDER — SPIRONOLACTONE 12.5 MG HALF TABLET: ORAL_TABLET | ORAL | Status: DC

## 2012-09-12 NOTE — Telephone Encounter (Signed)
New problem   Pt need to change prescription for Spironolactone 50mg  that the mg should be 12.5mg . Please call pt

## 2012-09-12 NOTE — Telephone Encounter (Signed)
Per [employer] - she reports wheezing and cough are from bronchitis.  HR is still irregular.  Pt is not taking medications as ordered.  She reports pt's girlfriend is filling his pill box based on what is on the bottles and not what is on the discharge summary sheets.  She dumped his pill box today and re-loaded it with the medications as RXed.  She also wrote on the bottles as well of how he is supposed to take his medications.   Pt's weight is up 3 lbs from the last time she weighed him but the patient is not weighing daily.  HHN just wanted to keep [place] informed of what is going on in the home.

## 2012-09-12 NOTE — Telephone Encounter (Signed)
[facility], pt has gained 3 lbs since last week, is  Wheezing, and has irregular heartbeats, pls advise

## 2012-09-14 ENCOUNTER — Telehealth: Payer: Self-pay | Admitting: Cardiology

## 2012-09-14 NOTE — Telephone Encounter (Signed)
Tiffany from [facility] has not received POC and orders on this pt.   She will refax and I will have MD sign and return ASAP.  She is aware he is not back in this office until next week.

## 2012-09-14 NOTE — Telephone Encounter (Signed)
New problem   Raymond [name]/[facility] pt need to talk to your regarding home health care order. Please call Raymond [name]

## 2012-09-15 ENCOUNTER — Telehealth: Payer: Self-pay | Admitting: Cardiology

## 2012-09-15 NOTE — Telephone Encounter (Signed)
Spoke with pt who is very argumentative - about his cough and recently being treated for bronchitis at [facility]gent [facility].  Pt states he is tired of being treated by "these quacks".  His cough continues and he reports a product cough of thick sputum that is hard to get up.  States he took an antibiotic but "they only gave me 3 pills".  States that he is having a feeling of heaviness in his stomach.  He does not know if he has had a fever or not but reports he was "sweating and had the chills last night".  He complains that nothing is being done about his At Fib and wants to know why we "can't fix" him.  I advised pt he is and has been treated appropriately for his At Fib as deemed necessary by his MD.  He states that if he goes to UrgentCare they won't do anything about his At Fib.  Advised pt he is going there to be treated in follow up of his bronchitis and not for the treatment of At Fib.  Instructed pt again about the importance of daily weighs.  Pt stated he didn't see why it is important and he is weighed when his home health nurse comes once a week.  I spoke with Amber yesterday and she will continue to instruct pt of the importance of daily weighs as well.  We have both discussed with pt about this and its importance in treating his CHF.  He was instructed to return to Urgent Care for further treatment of bronchitis.  He stated understanding but was non-committal.

## 2012-09-15 NOTE — Telephone Encounter (Signed)
New problem    Pt is having heaviness in his legs. Pt called Tuesday 09/12/12 and stated no one has called him back. Pt is very concern. Please call pt.

## 2012-09-20 ENCOUNTER — Encounter: Payer: Self-pay | Admitting: Physician Assistant

## 2012-09-20 ENCOUNTER — Ambulatory Visit (INDEPENDENT_AMBULATORY_CARE_PROVIDER_SITE_OTHER): Payer: Medicare Other | Admitting: Physician Assistant

## 2012-09-20 ENCOUNTER — Encounter: Payer: Self-pay | Admitting: *Deleted

## 2012-09-20 ENCOUNTER — Telehealth: Payer: Self-pay | Admitting: *Deleted

## 2012-09-20 VITALS — BP 118/79 | HR 82 | Wt 285.0 lb

## 2012-09-20 DIAGNOSIS — I10 Essential (primary) hypertension: Secondary | ICD-10-CM

## 2012-09-20 DIAGNOSIS — I5023 Acute on chronic systolic (congestive) heart failure: Secondary | ICD-10-CM

## 2012-09-20 DIAGNOSIS — I4891 Unspecified atrial fibrillation: Secondary | ICD-10-CM

## 2012-09-20 DIAGNOSIS — I509 Heart failure, unspecified: Secondary | ICD-10-CM

## 2012-09-20 DIAGNOSIS — Z79899 Other long term (current) drug therapy: Secondary | ICD-10-CM

## 2012-09-20 LAB — BASIC METABOLIC PANEL
GFR: 45.27 mL/min — ABNORMAL LOW (ref >60.00)
Glucose, Bld: 116 mg/dL — ABNORMAL HIGH (ref 70–99)
Potassium: 3.7 mEq/L (ref 3.5–5.1)
Sodium: 136 mEq/L (ref 135–145)

## 2012-09-20 MED ORDER — SPIRONOLACTONE 12.5 MG HALF TABLET: ORAL_TABLET | ORAL | Status: DC

## 2012-09-20 NOTE — Assessment & Plan Note (Signed)
Patient is in normal sinus rhythm today 

## 2012-09-20 NOTE — Patient Instructions (Signed)
Your physician recommends that you schedule a follow-up appointment in:   1 MONTH WITH DR [facility]  Your physician has recommended you make the following change in your medication: INCREASE DEMADEX TO 3 TABS DAILY  AS WELL AS POTASSIUM 3 TABS DAILY  FOR  3 DAYS THEN DECREASE  TO 2 TABS ON BOTH DAILY   Your physician recommends that you return for lab work in:  TODAY BMET  Your physician recommends that you weigh, daily, at the same time every day, and in the same amount of clothing. Please record your daily weights on the handout provided and bring it to your next appointment. .2 Gram Low Sodium Diet A 2 gram sodium diet restricts the amount of sodium in the diet to no more than 2 g or 2000 mg daily. Limiting the amount of sodium is often used to help lower blood pressure. It is important if you have heart, liver, or kidney problems. Many foods contain sodium for flavor and sometimes as a preservative. When the amount of sodium in a diet needs to be low, it is important to know what to look for when choosing foods and drinks. The following includes some information and guidelines to help make it easier for you to adapt to a low sodium diet. QUICK TIPS  Do not add salt to food.  Avoid convenience items and fast food.  Choose unsalted snack foods.  Buy lower sodium products, often labeled as "lower sodium" or "no salt added."  Check food labels to learn how much sodium is in 1 serving.  When eating at a restaurant, ask that your food be prepared with less salt or none, if possible. READING FOOD LABELS FOR SODIUM INFORMATION The nutrition facts label is a good place to find how much sodium is in foods. Look for products with no more than 500 to 600 mg of sodium per meal and no more than 150 mg per serving. Remember that 2 g = 2000 mg. The food label may also list foods as:  Sodium-free: Less than 5 mg in a serving.  Very low sodium: 35 mg or less in a serving.  Low-sodium: 140 mg or less  in a serving.  Light in sodium: 50% less sodium in a serving. For example, if a food that usually has 300 mg of sodium is changed to become light in sodium, it will have 150 mg of sodium.  Reduced sodium: 25% less sodium in a serving. For example, if a food that usually has 400 mg of sodium is changed to reduced sodium, it will have 300 mg of sodium. CHOOSING FOODS Grains  Avoid: Salted crackers and snack items. Some cereals, including instant hot cereals. Bread stuffing and biscuit mixes. Seasoned rice or pasta mixes.  Choose: Unsalted snack items. Low-sodium cereals, oats, puffed wheat and rice, shredded wheat. English muffins and bread. Pasta. Meats  Avoid: Salted, canned, smoked, spiced, pickled meats, including fish and poultry. Bacon, ham, sausage, cold cuts, hot dogs, anchovies.  Choose: Low-sodium canned tuna and salmon. Fresh or frozen meat, poultry, and fish. Dairy  Avoid: Processed cheese and spreads. Cottage cheese. Buttermilk and condensed milk. Regular cheese.  Choose: Milk. Low-sodium cottage cheese. Yogurt. Sour cream. Low-sodium cheese. Fruits and Vegetables  Avoid: Regular canned vegetables. Regular canned tomato sauce and paste. Frozen vegetables in sauces. Olives. Rosita Fire. Relishes. Sauerkraut.  Choose: Low-sodium canned vegetables. Low-sodium tomato sauce and paste. Frozen or fresh vegetables. Fresh and frozen fruit. Condiments  Avoid: Canned and packaged gravies. Worcestershire  sauce. Tartar sauce. Barbecue sauce. Soy sauce. Steak sauce. Ketchup. Onion, garlic, and table salt. Meat flavorings and tenderizers.  Choose: Fresh and dried herbs and spices. Low-sodium varieties of mustard and ketchup. Lemon juice. Tabasco sauce. Horseradish. SAMPLE 2 GRAM SODIUM MEAL PLAN Breakfast / Sodium (mg)  1 cup low-fat milk / 143 mg  2 slices whole-wheat toast / 270 mg  1 tbs heart-healthy margarine / 153 mg  1 hard-boiled egg / 139 mg  1 small orange / 0 mg Lunch /  Sodium (mg)  1 cup raw carrots / 76 mg   cup hummus / 298 mg  1 cup low-fat milk / 143 mg   cup red grapes / 2 mg  1 whole-wheat pita bread / 356 mg Dinner / Sodium (mg)  1 cup whole-wheat pasta / 2 mg  1 cup low-sodium tomato sauce / 73 mg  3 oz lean ground beef / 57 mg  1 small side salad (1 cup raw spinach leaves,  cup cucumber,  cup yellow bell pepper) with 1 tsp olive oil and 1 tsp red wine vinegar / 25 mg Snack / Sodium (mg)  1 container low-fat vanilla yogurt / 107 mg  3 graham cracker squares / 127 mg Nutrient Analysis  Calories: 2033  Protein: 77 g  Carbohydrate: 282 g  Fat: 72 g  Sodium: 1971 mg Document Released: 01/04/2005 Document Revised: 03/29/2011 Document Reviewed: 04/07/2009 Exit[employer] Patient Information 2014 [place], [place].

## 2012-09-20 NOTE — Addendum Note (Signed)
Addended by: Tonita Phoenix on: 09/20/2012 09:59 AM   Modules accepted: Orders

## 2012-09-20 NOTE — Progress Notes (Signed)
HPI:  This is a 52 year old male patient of Dr.Hochrein who had a recent hospitalization for acute bronchitis, acute on chronic heart failure EF 20% and recurrent atrial fibrillation with rapid ventricular response. His TSH was significantly depressed and his Synthroid is on hold.  The patient did not take his Demadex prior to coming here today. He stopped weighing himself daily because he says he didn't get a good response from our office. He wants to go back to the [employer] to do his water aerobics. He complains of orthopnea last night that resolved with sitting up. Otherwise his breathing has been pretty stable. He says his heart only skips in braces if he gets anxious or upset. He denies any chest pain, significant palpitations, dizziness, or presyncope.  Allergies:  -- Losartan -- Cough  -- Valsartan -- Cough  Current Outpatient Prescriptions on File Prior to Visit: acyclovir (ZOVIRAX) 800 MG tablet, Take 400 mg by mouth daily., Disp: , Rfl:  ALPRAZolam (XANAX) 0.25 MG tablet, Take 0.25 mg by mouth at bedtime as needed for sleep., Disp: , Rfl:  amiodarone (PACERONE) 200 MG tablet, Take 1 tablet (200 mg total) by mouth 2 (two) times daily., Disp: 60 tablet, Rfl: 5 benzonatate (TESSALON) 200 MG capsule, Take 1 capsule (200 mg total) by mouth 3 (three) times daily., Disp: 20 capsule, Rfl: 0 digoxin (LANOXIN) 0.125 MG tablet, Take 0.0625 mg by mouth at bedtime. , Disp: , Rfl:  levothyroxine (SYNTHROID, LEVOTHROID) 200 MCG tablet, Take 200 mcg by mouth daily before breakfast., Disp: , Rfl:  losartan (COZAAR) 50 MG tablet, Take 0.5 tablets (25 mg total) by mouth daily., Disp: , Rfl:  metolazone (ZAROXOLYN) 2.5 MG tablet, Take 2.5 mg by mouth daily. , Disp: , Rfl:  metoprolol (LOPRESSOR) 50 MG tablet, Take 2 tablets (100 mg total) by mouth 2 (two) times daily., Disp: , Rfl:  potassium chloride SA (K-DUR,KLOR-CON) 20 MEQ tablet, Take 2 tablets (40 mEq total) by mouth 2 (two) times daily., Disp: 120  tablet, Rfl: 11 spironolactone (ALDACTONE) 12.5 mg TABS tablet, 1 tablet daily, Disp: 30 tablet, Rfl: 6 torsemide (DEMADEX) 20 MG tablet, Take 1 tablet (20 mg total) by mouth 2 (two) times daily., Disp: 60 tablet, Rfl: 11 warfarin (COUMADIN) 7.5 MG tablet, Take 0.5-1 tablets (3.75-7.5 mg total) by mouth daily. Take 3.75mg  (1/2 tablet) daily except 7.5mg  (1 tablet) on Wednesdays and Sundays., Disp: , Rfl:   No current facility-administered medications on file prior to visit.   Past Medical History:   Systolic CHF, chronic                                        Non-ischemic cardiomyopathy                                    Comment:a. echo 11/10: EF 15%;   b. cath 10/08: normal               cors;  c. Myoview 5/12: Very mild distal ant               and apical isch, EF 17% (low risk-medical Rx               cont'd);  d.  Echo 4/14:  EF 15%;  e. 05/2009  s/p SJM BiV ICD;  f. 07/2012 TEE: EF 20-25%,               diff HK mild MR, mod TR.    Atrial fibrillation                                            Comment:a. on coumadin;  b. 07/2012 s/p TEE/DCCV. c.               Recurrence 08/2012 in setting of abnormal               thyroid panel.   Hypertension                                                 Hypothyroidism                                                 Comment:s/p RAI therapy   Obesity                                                      Non-compliance                                               Dyslexia                                                     History of CVA (cerebrovascular accident)       11/2008        Comment:a. 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 During 08/2012  adm.  Past Surgical History:   INSERT / REPLACE / REMOVE PACEMAKER              06/11/09        Comment:[employer] UNIFY [id number]  BIVENTRICULAR               AICD SERIAL [id number]   KNEE ARTHROSCOPY                                Left ~ 1995       CARDIAC CATHETERIZATION                                         Comment:"several time" (09/01/2012)   TEE WITHOUT CARDIOVERSION                       N/A 06/23/2012       Comment:Procedure: TRANSESOPHAGEAL ECHOCARDIOGRAM               (TEE);  Surgeon: Vesta Mixer, MD;                Location: [facility] ENDOSCOPY;  Service:               Cardiovascular;  Laterality: N/A;  TEE will be               done at 0730    CARDIOVERSION                                   N/A 07/27/2012      Comment:Procedure: CARDIOVERSION;  Surgeon: Vesta Mixer, MD;  Location: [facility] ENDOSCOPY;  Service:               Cardiovascular;  Laterality: N/A;   TEE WITHOUT CARDIOVERSION                       N/A 08/10/2012      Comment:Procedure: TRANSESOPHAGEAL ECHOCARDIOGRAM               (TEE);  Surgeon: Wendall Stade, MD;  Location:              [facility] ENDOSCOPY;  Service: Cardiovascular;                Laterality: N/A;   CARDIOVERSION                                   N/A 08/10/2012      Comment:Procedure: CARDIOVERSION;  Surgeon: Wendall Stade, MD;  Location: [facility] ENDOSCOPY;  Service:               Cardiovascular;  Laterality: N/A;   SP PTA ADD INTRA CRAN                            11/2008        Comment:a. 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 tx with tPA-> PTA and               stenting(09/01/2012)  Review of patient's family history indicates:   Heart disease                  Father  Social History   Marital Status: Legally Separated   Spouse Name:                      Years of Education:                 Number of children: 0           Occupational History Occupation          Teacher, music, HE DOES                                          WORK A LITTLE ON                                          [employer]  Social History Main Topics   Smoking Status: Never Smoker                     Smokeless Status: Never Used                       Alcohol Use: No             Drug Use: No             Sexual Activity: Not Currently      Other Topics            Concern   None on file  Social History Narrative   Lives alone in [place].  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  Does not routinely exercise.    ROS: See history of present illness   PHYSICAL EXAM: Well-nournished, in no acute distress. Neck: No JVD, HJR, Bruit, or thyroid enlargement  Lungs: Crackles at the bilateral bases otherwise clear No tachypnea, clear without wheezing,  or rhonchi  Cardiovascular: RRR, PMI not displaced, heart sounds normal, no murmurs, gallops, bruit, thrill, or heave.  Abdomen: BS normal. Soft without organomegaly, masses, lesions or tenderness.  Extremities: Trace of ankle edema, without cyanosis, clubbing. Good distal pulses bilateral  SKin: Warm, no lesions or rashes   Musculoskeletal: No deformities  Neuro: no focal signs  BP 118/79  Pulse 82  Wt 285 lb (129.275 kg)  BMI 42.07 kg/m2. Weight was 268 pounds at discharge. He is up 19 pounds.   EKG: Ventricular paced, normal sinus rhythm

## 2012-09-20 NOTE — Assessment & Plan Note (Signed)
Patient does have recurrent heart failure on exam today. His weight is up 19 pounds but he did not take his diuretics this morning. I've increased his Demadex to the 20 mg 3 tablets daily for 3 days then decrease to 2 tablets daily. Increase potassium to 60 mEq daily for 3 days then decrease to 40 mEq daily. He is once again advised to weigh himself daily and call if his weight increases by 2-3 pounds overnight. 2 g sodium diet. Check BMET today.

## 2012-09-20 NOTE — Telephone Encounter (Signed)
Pt was called with results of bmet. Pt request home health draw his blood next week. Amber RN [facility],  was called [phone]. She will draw his lab next week. Orders will be sent to Dr Antoine Poche.

## 2012-09-20 NOTE — Assessment & Plan Note (Signed)
Blood pressure stable ? ?

## 2012-09-20 NOTE — Telephone Encounter (Signed)
Message copied by Antony Odea on Wed Sep 20, 2012  2:27 PM ------      Message from: Raymond [name]      Created: Wed Sep 20, 2012  2:18 PM       F/u bmet next week ------

## 2012-09-21 ENCOUNTER — Ambulatory Visit (INDEPENDENT_AMBULATORY_CARE_PROVIDER_SITE_OTHER): Payer: Medicare Other | Admitting: Cardiovascular Disease

## 2012-09-21 DIAGNOSIS — I4891 Unspecified atrial fibrillation: Secondary | ICD-10-CM

## 2012-09-21 DIAGNOSIS — Z7901 Long term (current) use of anticoagulants: Secondary | ICD-10-CM

## 2012-09-21 LAB — POCT INR: INR: 1.4

## 2012-09-29 ENCOUNTER — Telehealth: Payer: Self-pay | Admitting: Cardiology

## 2012-09-29 ENCOUNTER — Encounter: Payer: Self-pay | Admitting: Cardiology

## 2012-09-29 ENCOUNTER — Ambulatory Visit (INDEPENDENT_AMBULATORY_CARE_PROVIDER_SITE_OTHER): Payer: Medicare Other | Admitting: Cardiology

## 2012-09-29 DIAGNOSIS — I4891 Unspecified atrial fibrillation: Secondary | ICD-10-CM

## 2012-09-29 DIAGNOSIS — Z7901 Long term (current) use of anticoagulants: Secondary | ICD-10-CM

## 2012-09-29 LAB — POCT INR: INR: 1.4

## 2012-09-29 NOTE — Telephone Encounter (Signed)
Returned call to patient was told Dr.Hochrein's nurse will be back in office on Tuesday 10/03/12 will send message to her.

## 2012-09-29 NOTE — Telephone Encounter (Signed)
New message:  Home health will no longer come to do Coumadin because he went to beach [place] the [place] and they discharged him.  Can you get home health to come back out to do the PT's.  Can you please call patient and let know.  Aware that Pam is off and will be ok for Monday.

## 2012-10-04 NOTE — Telephone Encounter (Signed)
I spoke with Raymond [name] case manager at [facility] [phone]. They are no longer able to provide services to pt based on Medicare guidelines. To be homebound he can go to grocery store, doctors appts, essential activities, but pt is no longer considered to be homebound since he went to the beach.

## 2012-10-04 NOTE — Telephone Encounter (Signed)
Pt notified, he is aware he has appt 10/06/12 here at [facility] for INR.

## 2012-10-04 NOTE — Telephone Encounter (Signed)
Follow up  Pt returning call about in home care.Marland Kitchen

## 2012-10-06 ENCOUNTER — Ambulatory Visit (INDEPENDENT_AMBULATORY_CARE_PROVIDER_SITE_OTHER): Payer: Medicare Other | Admitting: *Deleted

## 2012-10-06 DIAGNOSIS — I4891 Unspecified atrial fibrillation: Secondary | ICD-10-CM

## 2012-10-06 DIAGNOSIS — Z7901 Long term (current) use of anticoagulants: Secondary | ICD-10-CM

## 2012-10-06 LAB — POCT INR: INR: 2.1

## 2012-10-25 ENCOUNTER — Encounter: Payer: Self-pay | Admitting: Endocrinology

## 2012-10-25 ENCOUNTER — Ambulatory Visit (INDEPENDENT_AMBULATORY_CARE_PROVIDER_SITE_OTHER): Payer: Medicare Other | Admitting: Endocrinology

## 2012-10-25 ENCOUNTER — Ambulatory Visit (INDEPENDENT_AMBULATORY_CARE_PROVIDER_SITE_OTHER): Payer: Medicare Other | Admitting: *Deleted

## 2012-10-25 VITALS — BP 130/80 | HR 110 | Wt 292.0 lb

## 2012-10-25 DIAGNOSIS — I4891 Unspecified atrial fibrillation: Secondary | ICD-10-CM

## 2012-10-25 DIAGNOSIS — E890 Postprocedural hypothyroidism: Secondary | ICD-10-CM

## 2012-10-25 DIAGNOSIS — Z7901 Long term (current) use of anticoagulants: Secondary | ICD-10-CM

## 2012-10-25 LAB — POCT INR: INR: 1.5

## 2012-10-25 NOTE — Patient Instructions (Addendum)
blood tests are being requested for you today.  We'll contact you with results. Please come back for a follow-up appointment in 6 weeks.   

## 2012-10-25 NOTE — Progress Notes (Signed)
Subjective:    Patient ID: Raymond [name], male    DOB: 1960/12/04, 52 y.o.   MRN: [id number]  HPI Pt states he was dx'ed with hyperthyroidism in 1999. He describes what sounds like i-131 rx, in approx 2002. He has been on amiodarone since approx 2004.  He has required serial adjustments in his synthroid over the past few years.  He requires a very high dosage of synthroid, probably also due to amiodarone.  He was hospitalized in august of 2014 with AF, and TSH was suppressed.  Synthroid was stopped, then resumed at 200 mcg/day.  pt states he feels well in general. Past Medical History  Diagnosis Date  . Systolic CHF, chronic   . Non-ischemic cardiomyopathy     a. echo 11/10: EF 15%;   b. cath 10/08: normal cors;  c. Myoview 5/12: Very mild distal ant and apical isch, EF 17% (low risk-medical Rx cont'd);  d.  Echo 4/14:  EF 15%;  e. 05/2009 s/p SJM BiV ICD;  f. 07/2012 TEE: EF 20-25%, diff HK mild MR, mod TR.   Marland Kitchen Atrial fibrillation     a. on coumadin;  b. 07/2012 s/p TEE/DCCV. c. Recurrence 08/2012 in setting of abnormal thyroid panel.  . Hypertension   . Hypothyroidism     s/p RAI therapy  . Obesity   . Non-compliance   . Dyslexia   . 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 RBBB (right bundle branch block)   . 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 Automatic implantable cardioverter-defibrillator in situ   . Cough syncope     a. During 08/2012 adm.    Past Surgical History  Procedure Laterality Date  . Insert / replace / remove pacemaker  06/11/09    [employer] UNIFY [id number] BIVENTRICULAR AICD SERIAL [id number]  . Knee arthroscopy Left ~ 1995  . Cardiac catheterization      "several time" (09/01/2012)  . Tee without cardioversion N/A 06/23/2012    Procedure: TRANSESOPHAGEAL ECHOCARDIOGRAM (TEE);  Surgeon: Vesta Mixer, MD;  Location: [facility] ENDOSCOPY;  Service: Cardiovascular;  Laterality: N/A;  TEE will be done  at 0730   . Cardioversion N/A 07/27/2012    Procedure: CARDIOVERSION;  Surgeon: Vesta Mixer, MD;  Location: [facility] ENDOSCOPY;  Service: Cardiovascular;  Laterality: N/A;  . Tee without cardioversion N/A 08/10/2012    Procedure: TRANSESOPHAGEAL ECHOCARDIOGRAM (TEE);  Surgeon: Wendall Stade, MD;  Location: [facility] ENDOSCOPY;  Service: Cardiovascular;  Laterality: N/A;  . Cardioversion N/A 08/10/2012    Procedure: CARDIOVERSION;  Surgeon: Wendall Stade, MD;  Location: [facility] ENDOSCOPY;  Service: Cardiovascular;  Laterality: N/A;  . Sp pta add intra cran  11/2008    a. 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 Marital Status: Legally Separated    Spouse Name: N/A    Number of Children: 0  . Years of Education: N/A   Occupational History  . DISABLED     HOWEVER, HE DOES WORK A LITTLE ON CARS   Social History Main Topics  . Smoking status: Never Smoker   . Smokeless tobacco: Never Used  . Alcohol Use: No  . Drug Use: No  . Sexual Activity: Not Currently   Other Topics Concern  . Not on file   Social History Narrative   Lives alone in [place].  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  Does not routinely exercise.    Current Outpatient Prescriptions on File Prior to Visit  Medication Sig Dispense Refill  . acyclovir (ZOVIRAX) 800 MG tablet Take 400 mg by mouth daily.      Marland Kitchen ALPRAZolam (XANAX) 0.25 MG tablet Take 0.25 mg by mouth at bedtime as needed for sleep.      Marland Kitchen amiodarone (PACERONE) 200 MG tablet Take 1 tablet (200 mg total) by mouth 2 (two) times daily.  60 tablet  5  . benzonatate (TESSALON) 200 MG capsule Take 1 capsule (200 mg total) by mouth 3 (three) times daily.  20 capsule  0  . digoxin (LANOXIN) 0.125 MG tablet Take 0.0625 mg by mouth at bedtime.       Marland Kitchen losartan (COZAAR) 50 MG tablet Take 0.5 tablets (25 mg total) by mouth daily.      . metolazone (ZAROXOLYN) 2.5 MG tablet Take 2.5 mg by mouth daily.       .  metoprolol (LOPRESSOR) 50 MG tablet Take 2 tablets (100 mg total) by mouth 2 (two) times daily.      . potassium chloride SA (K-DUR,KLOR-CON) 20 MEQ tablet Take 2 tablets (40 mEq total) by mouth 2 (two) times daily.  120 tablet  11  . spironolactone (ALDACTONE) 12.5 mg TABS tablet 1/2  tablet daily  30 tablet  6  . torsemide (DEMADEX) 20 MG tablet Take 1 tablet (20 mg total) by mouth 2 (two) times daily.  60 tablet  11  . warfarin (COUMADIN) 7.5 MG tablet Take 0.5-1 tablets (3.75-7.5 mg total) by mouth daily. Take 3.75mg  (1/2 tablet) daily except 7.5mg  (1 tablet) on Wednesdays and Sundays.       No current facility-administered medications on file prior to visit.    Allergies  Allergen Reactions  . Losartan Cough  . Valsartan Cough    Family History  Problem Relation Age of Onset  . Heart disease Father     BP 130/80  Pulse 110  Wt 292 lb (132.45 kg)  BMI 43.1 kg/m2  SpO2 95%  Review of Systems Denies weight change    Objective:   Physical Exam VITAL SIGNS:  See vs page GENERAL: no distress Skin: not diaphoretic Neuro: moderate tremor of the hands.   Lab Results  Component Value Date   TSH 22.87* 10/25/2012   T3TOTAL 64.1* 07/30/2009   T4TOTAL 5.7 07/23/2009      Assessment & Plan:  Hypothyroidism, in the setting of amiodarone.  He needs increased rx.

## 2012-10-26 MED ORDER — LEVOTHYROXINE SODIUM 150 MCG PO TABS: 150.0000 ug | ORAL_TABLET | Freq: Every day | ORAL | Status: DC

## 2012-11-03 ENCOUNTER — Ambulatory Visit (INDEPENDENT_AMBULATORY_CARE_PROVIDER_SITE_OTHER): Payer: Medicare Other | Admitting: *Deleted

## 2012-11-03 ENCOUNTER — Encounter: Payer: Self-pay | Admitting: Cardiology

## 2012-11-03 ENCOUNTER — Telehealth: Payer: Self-pay | Admitting: Endocrinology

## 2012-11-03 ENCOUNTER — Ambulatory Visit (INDEPENDENT_AMBULATORY_CARE_PROVIDER_SITE_OTHER): Payer: Medicare Other | Admitting: Cardiology

## 2012-11-03 ENCOUNTER — Ambulatory Visit (INDEPENDENT_AMBULATORY_CARE_PROVIDER_SITE_OTHER): Payer: Medicare Other | Admitting: Pharmacist

## 2012-11-03 VITALS — BP 122/80 | HR 97 | Ht 69.0 in | Wt 289.0 lb

## 2012-11-03 DIAGNOSIS — I4891 Unspecified atrial fibrillation: Secondary | ICD-10-CM

## 2012-11-03 DIAGNOSIS — I509 Heart failure, unspecified: Secondary | ICD-10-CM

## 2012-11-03 DIAGNOSIS — Z95810 Presence of automatic (implantable) cardiac defibrillator: Secondary | ICD-10-CM

## 2012-11-03 DIAGNOSIS — I5023 Acute on chronic systolic (congestive) heart failure: Secondary | ICD-10-CM

## 2012-11-03 DIAGNOSIS — Z7901 Long term (current) use of anticoagulants: Secondary | ICD-10-CM

## 2012-11-03 DIAGNOSIS — I428 Other cardiomyopathies: Secondary | ICD-10-CM

## 2012-11-03 DIAGNOSIS — Z79899 Other long term (current) drug therapy: Secondary | ICD-10-CM

## 2012-11-03 DIAGNOSIS — I10 Essential (primary) hypertension: Secondary | ICD-10-CM

## 2012-11-03 DIAGNOSIS — I5022 Chronic systolic (congestive) heart failure: Secondary | ICD-10-CM

## 2012-11-03 LAB — BASIC METABOLIC PANEL
BUN: 19 mg/dL (ref 6–23)
CO2: 29 mEq/L (ref 19–32)
Calcium: 9.1 mg/dL (ref 8.4–10.5)
Creatinine, Ser: 1.3 mg/dL (ref 0.4–1.5)
Glucose, Bld: 108 mg/dL — ABNORMAL HIGH (ref 70–99)

## 2012-11-03 LAB — POCT INR: INR: 2

## 2012-11-03 MED ORDER — LEVOTHYROXINE SODIUM 125 MCG PO TABS: ORAL_TABLET | ORAL | Status: DC

## 2012-11-03 NOTE — Progress Notes (Signed)
HPI  the patient presents for followup of his cardiomyopathy. I last saw him when he was hospitalized with decompensated failure and atrial fibrillation. He has been on amiodarone and we've been working around his profound hypothyroidism. He is seeing Dr. Everardo All for management of this. His TSH has come down but is still markedly elevated.   He has not noticed any palpitations, presyncope or syncope. He has had no acute exacerbations of his shortness of breath. He denies any chest pressure, neck or arm discomfort. He's not had any PND or orthopnea. He gets around with a cane vitamins Irving Burton active since his stroke. He is watching his salt and fluid restriction.  Allergies  Allergen Reactions  . Losartan Cough  . Valsartan Cough    Current Outpatient Prescriptions  Medication Sig Dispense Refill  . acyclovir (ZOVIRAX) 800 MG tablet Take 400 mg by mouth daily.      Marland Kitchen ALPRAZolam (XANAX) 0.25 MG tablet Take 0.25 mg by mouth at bedtime as needed for sleep.      Marland Kitchen amiodarone (PACERONE) 200 MG tablet Take 1 tablet (200 mg total) by mouth 2 (two) times daily.  60 tablet  5  . benzonatate (TESSALON) 200 MG capsule Take 1 capsule (200 mg total) by mouth 3 (three) times daily.  20 capsule  0  . digoxin (LANOXIN) 0.125 MG tablet Take 0.0625 mg by mouth at bedtime.       Marland Kitchen levothyroxine (SYNTHROID, LEVOTHROID) 150 MCG tablet Take 1 tablet (150 mcg total) by mouth daily before breakfast.  30 tablet  2  . losartan (COZAAR) 50 MG tablet Take 0.5 tablets (25 mg total) by mouth daily.      . metolazone (ZAROXOLYN) 2.5 MG tablet Take 2.5 mg by mouth daily.       . metoprolol (LOPRESSOR) 50 MG tablet Take 2 tablets (100 mg total) by mouth 2 (two) times daily.      . potassium chloride SA (K-DUR,KLOR-CON) 20 MEQ tablet Take 2 tablets (40 mEq total) by mouth 2 (two) times daily.  120 tablet  11  . spironolactone (ALDACTONE) 12.5 mg TABS tablet 1/2  tablet daily  30 tablet  6  . torsemide (DEMADEX) 20 MG tablet  Take 1 tablet (20 mg total) by mouth 2 (two) times daily.  60 tablet  11  . warfarin (COUMADIN) 7.5 MG tablet Take 0.5-1 tablets (3.75-7.5 mg total) by mouth daily. Take 3.75mg  (1/2 tablet) daily except 7.5mg  (1 tablet) on Wednesdays and Sundays.       No current facility-administered medications for this visit.    Past Medical History  Diagnosis Date  . Systolic CHF, chronic   . Non-ischemic cardiomyopathy     a. echo 11/10: EF 15%;   b. cath 10/08: normal cors;  c. Myoview 5/12: Very mild distal ant and apical isch, EF 17% (low risk-medical Rx cont'd);  d.  Echo 4/14:  EF 15%;  e. 05/2009 s/p SJM BiV ICD;  f. 07/2012 TEE: EF 20-25%, diff HK mild MR, mod TR.   Marland Kitchen Atrial fibrillation     a. on coumadin;  b. 07/2012 s/p TEE/DCCV. c. Recurrence 08/2012 in setting of abnormal thyroid panel.  . Hypertension   . Hypothyroidism     s/p RAI therapy  . Obesity   . Non-compliance   . Dyslexia   . 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 RBBB (right bundle branch  block)   . 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 Automatic implantable cardioverter-defibrillator in situ   . Cough syncope     a. During 08/2012 adm.    Past Surgical History  Procedure Laterality Date  . Insert / replace / remove pacemaker  06/11/09    [employer] UNIFY [id number] BIVENTRICULAR AICD SERIAL [phone]  . Knee arthroscopy Left ~ 1995  . Cardiac catheterization      "several time" (09/01/2012)  . Tee without cardioversion N/A 06/23/2012    Procedure: TRANSESOPHAGEAL ECHOCARDIOGRAM (TEE);  Surgeon: Vesta Mixer, MD;  Location: [facility] ENDOSCOPY;  Service: Cardiovascular;  Laterality: N/A;  TEE will be done at 0730   . Cardioversion N/A 07/27/2012    Procedure: CARDIOVERSION;  Surgeon: Vesta Mixer, MD;  Location: [facility] ENDOSCOPY;  Service: Cardiovascular;  Laterality: N/A;  . Tee without cardioversion N/A 08/10/2012    Procedure: TRANSESOPHAGEAL  ECHOCARDIOGRAM (TEE);  Surgeon: Wendall Stade, MD;  Location: [facility] ENDOSCOPY;  Service: Cardiovascular;  Laterality: N/A;  . Cardioversion N/A 08/10/2012    Procedure: CARDIOVERSION;  Surgeon: Wendall Stade, MD;  Location: [facility] ENDOSCOPY;  Service: Cardiovascular;  Laterality: N/A;  . Sp pta add intra cran  11/2008    a. 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  PHYSICAL EXAM BP 135/97  Pulse 97  Ht 5\' 9"  (1.753 m)  Wt 289 lb (131.09 kg)  BMI 42.66 kg/m2 GENERAL:  Well appearing HEENT:  Pupils equal round and reactive, fundi not visualized, oral mucosa unremarkable NECK:  No jugular venous distention, waveform within normal limits, carotid upstroke brisk and symmetric, no bruits, no thyromegaly LYMPHATICS:  No cervical, inguinal adenopathy LUNGS:  Clear to auscultation bilaterally BACK:  No CVA tenderness CHEST:  ICD pocket HEART:  PMI not displaced or sustained,S1 and S2 within normal limits, no S3, no S4, no clicks, no rubs, no murmurs ABD:  Flat, positive bowel sounds normal in frequency in pitch, no bruits, no rebound, no guarding, no midline pulsatile mass, no hepatomegaly, no splenomegaly EXT:  2 plus pulses throughout, no edema, no cyanosis no clubbing SKIN:  No rashes no nodules NEURO:  Left  hemiparesis PSYCH:  Cognitively intact, oriented to person place and time   ASSESSMENT AND PLAN  DILATED CARDIOMYOPATHY:  He seems to be euvolemic.  At this point, no change in therapy is indicated.  We have reviewed salt and fluid restrictions.  No further cardiovascular testing is indicated.  I will check a BMET today.   ICD:  Interrogated today.  See below.  HTN: The blood pressure is at target. No change in medications is indicated. We will continue with therapeutic lifestyle changes (TLC).This is being managed in the context of treating his CHF  ATRIAL FIB:  He does have some paroxysms of  atrial fib but nothing sustained.  The longest was 8 days in August.   I did send a message to Dr. Graciela Husbands to see if we want to consider any further procedures.  For now he will continue the meds as listed. Of note his TSH is still 22.   There is some confusion about his Synthroid dosing and 09/24/2000 Dr. Everardo All to clarify

## 2012-11-03 NOTE — Patient Instructions (Addendum)
The current medical regimen is effective;  continue present plan and medications.  Please have blood work today  (BMP)  Follow up with Dr Graciela Husbands in 3 months.  Follow up with Dr Antoine Poche in 4 months  You will be contacted by the Research when you are due back to see them in 6 months.

## 2012-11-03 NOTE — Progress Notes (Signed)
Research/CRT-D + OptiVol check in clinic. Pt in Mode Switch 43% of time + coumadin. All functions normal, all measurements consistent, no changes made. See PaceArt for full details.  ROV w/ Dr. Graciela Husbands in 3 mo.    [employer]

## 2012-11-03 NOTE — Telephone Encounter (Signed)
Pt advised.

## 2012-11-03 NOTE — Telephone Encounter (Signed)
please call patient: The thyroid pill should be 2x125 mcg per day.  i have sent a corrected prescription to your pharmacy. i'll see you next time.

## 2012-11-20 ENCOUNTER — Ambulatory Visit (INDEPENDENT_AMBULATORY_CARE_PROVIDER_SITE_OTHER): Payer: Medicare Other | Admitting: General Practice

## 2012-11-20 DIAGNOSIS — I4891 Unspecified atrial fibrillation: Secondary | ICD-10-CM

## 2012-11-20 DIAGNOSIS — Z7901 Long term (current) use of anticoagulants: Secondary | ICD-10-CM

## 2012-11-24 ENCOUNTER — Encounter: Payer: Self-pay | Admitting: Internal Medicine

## 2012-12-04 ENCOUNTER — Ambulatory Visit (INDEPENDENT_AMBULATORY_CARE_PROVIDER_SITE_OTHER): Payer: Medicare Other | Admitting: Pharmacist

## 2012-12-04 DIAGNOSIS — Z7901 Long term (current) use of anticoagulants: Secondary | ICD-10-CM

## 2012-12-04 DIAGNOSIS — I4891 Unspecified atrial fibrillation: Secondary | ICD-10-CM

## 2012-12-06 ENCOUNTER — Ambulatory Visit: Payer: Medicare Other | Admitting: Endocrinology

## 2012-12-27 ENCOUNTER — Ambulatory Visit (INDEPENDENT_AMBULATORY_CARE_PROVIDER_SITE_OTHER): Payer: Medicare Other | Admitting: Pharmacist

## 2012-12-27 DIAGNOSIS — I4891 Unspecified atrial fibrillation: Secondary | ICD-10-CM

## 2012-12-27 DIAGNOSIS — Z7901 Long term (current) use of anticoagulants: Secondary | ICD-10-CM

## 2013-01-07 ENCOUNTER — Inpatient Hospital Stay (HOSPITAL_COMMUNITY)
Admission: EM | Admit: 2013-01-07 | Discharge: 2013-01-10 | DRG: 292 | Disposition: A | Discharge status: Home Health | Payer: Medicare Other | Attending: Cardiology | Admitting: Cardiology

## 2013-01-07 ENCOUNTER — Emergency Department (HOSPITAL_COMMUNITY): Payer: Medicare Other

## 2013-01-07 ENCOUNTER — Encounter (HOSPITAL_COMMUNITY): Payer: Self-pay | Admitting: Emergency Medicine

## 2013-01-07 DIAGNOSIS — Z8673 Personal history of transient ischemic attack (TIA), and cerebral infarction without residual deficits: Secondary | ICD-10-CM

## 2013-01-07 DIAGNOSIS — E0590 Thyrotoxicosis, unspecified without thyrotoxic crisis or storm: Secondary | ICD-10-CM | POA: Diagnosis present

## 2013-01-07 DIAGNOSIS — K219 Gastro-esophageal reflux disease without esophagitis: Secondary | ICD-10-CM | POA: Diagnosis present

## 2013-01-07 DIAGNOSIS — E669 Obesity, unspecified: Secondary | ICD-10-CM | POA: Diagnosis present

## 2013-01-07 DIAGNOSIS — I129 Hypertensive chronic kidney disease with stage 1 through stage 4 chronic kidney disease, or unspecified chronic kidney disease: Secondary | ICD-10-CM | POA: Diagnosis present

## 2013-01-07 DIAGNOSIS — Z7901 Long term (current) use of anticoagulants: Secondary | ICD-10-CM

## 2013-01-07 DIAGNOSIS — I4510 Unspecified right bundle-branch block: Secondary | ICD-10-CM | POA: Diagnosis present

## 2013-01-07 DIAGNOSIS — I509 Heart failure, unspecified: Secondary | ICD-10-CM | POA: Diagnosis present

## 2013-01-07 DIAGNOSIS — Z91199 Patient's noncompliance with other medical treatment and regimen due to unspecified reason: Secondary | ICD-10-CM

## 2013-01-07 DIAGNOSIS — I4892 Unspecified atrial flutter: Secondary | ICD-10-CM | POA: Diagnosis present

## 2013-01-07 DIAGNOSIS — N183 Chronic kidney disease, stage 3 unspecified: Secondary | ICD-10-CM | POA: Diagnosis present

## 2013-01-07 DIAGNOSIS — I4891 Unspecified atrial fibrillation: Secondary | ICD-10-CM

## 2013-01-07 DIAGNOSIS — I428 Other cardiomyopathies: Secondary | ICD-10-CM | POA: Diagnosis present

## 2013-01-07 DIAGNOSIS — I5023 Acute on chronic systolic (congestive) heart failure: Secondary | ICD-10-CM

## 2013-01-07 DIAGNOSIS — Z9119 Patient's noncompliance with other medical treatment and regimen: Secondary | ICD-10-CM

## 2013-01-07 DIAGNOSIS — R000 Tachycardia, unspecified: Secondary | ICD-10-CM | POA: Diagnosis present

## 2013-01-07 DIAGNOSIS — Z95810 Presence of automatic (implantable) cardiac defibrillator: Secondary | ICD-10-CM

## 2013-01-07 DIAGNOSIS — J209 Acute bronchitis, unspecified: Secondary | ICD-10-CM | POA: Diagnosis present

## 2013-01-07 DIAGNOSIS — E039 Hypothyroidism, unspecified: Secondary | ICD-10-CM | POA: Diagnosis present

## 2013-01-07 DIAGNOSIS — I10 Essential (primary) hypertension: Secondary | ICD-10-CM

## 2013-01-07 DIAGNOSIS — R480 Dyslexia and alexia: Secondary | ICD-10-CM | POA: Diagnosis present

## 2013-01-07 LAB — BASIC METABOLIC PANEL
CO2: 20 mEq/L (ref 19–32)
Chloride: 105 mEq/L (ref 96–112)
Creatinine, Ser: 1.45 mg/dL — ABNORMAL HIGH (ref 0.50–1.35)
GFR calc non Af Amer: 55 mL/min — ABNORMAL LOW (ref >90)
Glucose, Bld: 130 mg/dL — ABNORMAL HIGH (ref 70–99)
Potassium: 4.6 mEq/L (ref 3.5–5.1)
Sodium: 139 mEq/L (ref 135–145)

## 2013-01-07 LAB — COMPREHENSIVE METABOLIC PANEL
ALT: 22 U/L (ref 0–53)
AST: 38 U/L — ABNORMAL HIGH (ref 0–37)
Albumin: 3.4 g/dL — ABNORMAL LOW (ref 3.5–5.2)
Alkaline Phosphatase: 118 U/L — ABNORMAL HIGH (ref 39–117)
BUN: 22 mg/dL (ref 6–23)
Calcium: 8.9 mg/dL (ref 8.4–10.5)
Chloride: 100 mEq/L (ref 96–112)
GFR calc Af Amer: 61 mL/min — ABNORMAL LOW (ref >90)
Glucose, Bld: 113 mg/dL — ABNORMAL HIGH (ref 70–99)
Potassium: 3.7 mEq/L (ref 3.5–5.1)
Sodium: 136 mEq/L (ref 135–145)
Total Protein: 6.9 g/dL (ref 6.0–8.3)

## 2013-01-07 LAB — PROTIME-INR: Prothrombin Time: 20.6 seconds — ABNORMAL HIGH (ref 11.6–15.2)

## 2013-01-07 LAB — CBC
Hemoglobin: 13.8 g/dL (ref 13.0–17.0)
RBC: 4.7 MIL/uL (ref 4.22–5.81)
WBC: 7.4 10*3/uL (ref 4.0–10.5)

## 2013-01-07 LAB — DIGOXIN LEVEL: Digoxin Level: 0.6 ng/mL — ABNORMAL LOW (ref 0.8–2.0)

## 2013-01-07 LAB — TROPONIN I: Troponin I: <0.3 ng/mL (ref <0.30)

## 2013-01-07 MED ORDER — WARFARIN - PHARMACIST DOSING INPATIENT: Freq: Every day | Status: DC

## 2013-01-07 MED ORDER — ALPRAZOLAM 0.25 MG PO TABS
0.2500 mg | ORAL_TABLET | Freq: Every evening | ORAL | Status: DC | PRN
  Administered 2013-01-09: 0.25 mg via ORAL
  Filled 2013-01-07: qty 1

## 2013-01-07 MED ORDER — POTASSIUM CHLORIDE CRYS ER 20 MEQ PO TBCR
40.0000 meq | EXTENDED_RELEASE_TABLET | Freq: Two times a day (BID) | ORAL | Status: DC
  Administered 2013-01-07 – 2013-01-10 (×5): 40 meq via ORAL
  Filled 2013-01-07 (×9): qty 2

## 2013-01-07 MED ORDER — LOSARTAN POTASSIUM 25 MG PO TABS
25.0000 mg | ORAL_TABLET | Freq: Every day | ORAL | Status: DC
  Administered 2013-01-07 – 2013-01-10 (×3): 25 mg via ORAL
  Filled 2013-01-07 (×5): qty 1

## 2013-01-07 MED ORDER — ALPRAZOLAM 0.25 MG PO TABS
0.2500 mg | ORAL_TABLET | Freq: Two times a day (BID) | ORAL | Status: DC | PRN
  Administered 2013-01-08: 15:00:00 0.25 mg via ORAL
  Filled 2013-01-07 (×2): qty 1

## 2013-01-07 MED ORDER — WARFARIN SODIUM 6 MG PO TABS
9.0000 mg | ORAL_TABLET | ORAL | Status: AC
  Administered 2013-01-07: 9 mg via ORAL
  Filled 2013-01-07 (×2): qty 1

## 2013-01-07 MED ORDER — WARFARIN SODIUM 7.5 MG PO TABS: 7.5000 mg | ORAL_TABLET | Freq: Every day | ORAL | Status: DC

## 2013-01-07 MED ORDER — INFLUENZA VAC SPLIT QUAD 0.5 ML IM SUSP
0.5000 mL | INTRAMUSCULAR | Status: AC
  Administered 2013-01-08: 0.5 mL via INTRAMUSCULAR
  Filled 2013-01-07: qty 0.5

## 2013-01-07 MED ORDER — METOPROLOL TARTRATE 100 MG PO TABS
100.0000 mg | ORAL_TABLET | Freq: Two times a day (BID) | ORAL | Status: DC
  Administered 2013-01-07 – 2013-01-10 (×5): 100 mg via ORAL
  Filled 2013-01-07 (×9): qty 1

## 2013-01-07 MED ORDER — FUROSEMIDE 10 MG/ML IJ SOLN
80.0000 mg | Freq: Four times a day (QID) | INTRAMUSCULAR | Status: DC
  Administered 2013-01-07 – 2013-01-08 (×2): 80 mg via INTRAVENOUS
  Filled 2013-01-07 (×6): qty 8

## 2013-01-07 MED ORDER — SODIUM CHLORIDE 0.9 % IJ SOLN: 3.0000 mL | INTRAMUSCULAR | Status: DC | PRN

## 2013-01-07 MED ORDER — SODIUM CHLORIDE 0.9 % IJ SOLN
3.0000 mL | Freq: Two times a day (BID) | INTRAMUSCULAR | Status: DC
  Administered 2013-01-07 – 2013-01-09 (×4): 3 mL via INTRAVENOUS

## 2013-01-07 MED ORDER — ACETAMINOPHEN 325 MG PO TABS: 650.0000 mg | ORAL_TABLET | ORAL | Status: DC | PRN

## 2013-01-07 MED ORDER — LEVOTHYROXINE SODIUM 125 MCG PO TABS
250.0000 ug | ORAL_TABLET | Freq: Every day | ORAL | Status: DC
  Administered 2013-01-08 – 2013-01-10 (×2): 250 ug via ORAL
  Filled 2013-01-07 (×5): qty 2

## 2013-01-07 MED ORDER — ACYCLOVIR 400 MG PO TABS
400.0000 mg | ORAL_TABLET | Freq: Every day | ORAL | Status: DC
Start: 2013-01-08 — End: 2013-01-10
  Administered 2013-01-08 – 2013-01-10 (×2): 400 mg via ORAL
  Filled 2013-01-07 (×3): qty 1

## 2013-01-07 MED ORDER — FUROSEMIDE 10 MG/ML IJ SOLN
80.0000 mg | Freq: Once | INTRAMUSCULAR | Status: AC
  Administered 2013-01-07: 80 mg via INTRAVENOUS
  Filled 2013-01-07: qty 8

## 2013-01-07 MED ORDER — SPIRONOLACTONE 12.5 MG HALF TABLET
12.5000 mg | ORAL_TABLET | Freq: Every day | ORAL | Status: DC
  Administered 2013-01-08 – 2013-01-10 (×2): 12.5 mg via ORAL
  Filled 2013-01-07 (×3): qty 1

## 2013-01-07 MED ORDER — SODIUM CHLORIDE 0.9 % IV SOLN: 250.0000 mL | INTRAVENOUS | Status: DC | PRN

## 2013-01-07 MED ORDER — METOLAZONE 2.5 MG PO TABS
2.5000 mg | ORAL_TABLET | Freq: Every day | ORAL | Status: DC
  Administered 2013-01-08: 10:00:00 2.5 mg via ORAL
  Filled 2013-01-07: qty 1

## 2013-01-07 MED ORDER — DIGOXIN 0.0625 MG HALF TABLET
0.0625 mg | ORAL_TABLET | Freq: Every day | ORAL | Status: DC
  Administered 2013-01-07 – 2013-01-09 (×3): 0.0625 mg via ORAL
  Filled 2013-01-07 (×5): qty 1

## 2013-01-07 MED ORDER — AMIODARONE HCL 200 MG PO TABS
200.0000 mg | ORAL_TABLET | Freq: Two times a day (BID) | ORAL | Status: DC
  Administered 2013-01-07 – 2013-01-10 (×5): 200 mg via ORAL
  Filled 2013-01-07 (×9): qty 1

## 2013-01-07 NOTE — Progress Notes (Signed)
Pt placed on cardiac monitor and was observed to be on vpaced initially then AV paced then  pacing in between QRS, pt asymptomatic, resting in bed, rapid response nurse April notified and came to check and verified the rhythm. Dr. Donnie Aho notified, no orders made at this time. Will continue to monitor.  Raymond [name] [place]

## 2013-01-07 NOTE — H&P (Addendum)
History and Physical   Admit date: 01/07/2013 Name:  Raymond [name] Medical record number: [id number] DOB/Age:  50-09-1960  52 y.o. male  Referring Physician:   [facility]  Primary Cardiologist:  Dr. Antoine Poche  Chief complaint/reason for admission: Cough and shortness of breath  HPI:  This 52 year old male has a history of a nonischemic cardiomyopathy and recurrent atrial fibrillation. He has a known ejection fraction of 15-20% and has a previous biventricular ICD. He has a history of medical noncompliance and evidently is reported to have received radioactive iodine in the past. The patient also has had a previous stroke treated with TPA and stenting and he has been on disability for this. He currently lives alone in [place].  He was brought to the emergency room tonight with progressive cough as well as progressive shortness of breath for the past 2 weeks. He does not way at home but has been drinking a lot of fluids cause of the cough. He states that he has not had any dietary noncompliance with salt and also states that he has not had any recent infection or has missed any of his medications. He has used the required a high dose of thyroid medications in the past but was diagnosed with hyperthyroidism in August and thyroid was discontinued at the time. In October his most recent TSH was elevated.    Past Medical History  Diagnosis Date  . Systolic CHF, chronic   . Non-ischemic cardiomyopathy     a. echo 11/10: EF 15%;   b. cath 10/08: normal cors;  c. Myoview 5/12: Very mild distal ant and apical isch, EF 17% (low risk-medical Rx cont'd);  d.  Echo 4/14:  EF 15%;  e. 05/2009 s/p SJM BiV ICD;  f. 07/2012 TEE: EF 20-25%, diff HK mild MR, mod TR.   Marland Kitchen Atrial fibrillation     a. on coumadin;  b. 07/2012 s/p TEE/DCCV. c. Recurrence 08/2012 in setting of abnormal thyroid panel.  . Hypertension   . Hypothyroidism     s/p RAI therapy  . Obesity   . Non-compliance   . Dyslexia   .  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 RBBB (right bundle branch block)   . 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 Automatic implantable cardioverter-defibrillator in situ   . Cough syncope     a. During 08/2012 adm.      Past Surgical History  Procedure Laterality Date  . Insert / replace / remove pacemaker  06/11/09    [employer] UNIFY [id number] BIVENTRICULAR AICD SERIAL [phone]  . Knee arthroscopy Left ~ 1995  . Cardiac catheterization      "several time" (09/01/2012)  . Tee without cardioversion N/A 06/23/2012    Procedure: TRANSESOPHAGEAL ECHOCARDIOGRAM (TEE);  Surgeon: Vesta Mixer, MD;  Location: [facility] ENDOSCOPY;  Service: Cardiovascular;  Laterality: N/A;  TEE will be done at 0730   . Cardioversion N/A 07/27/2012    Procedure: CARDIOVERSION;  Surgeon: Vesta Mixer, MD;  Location: [facility] ENDOSCOPY;  Service: Cardiovascular;  Laterality: N/A;  . Tee without cardioversion N/A 08/10/2012    Procedure: TRANSESOPHAGEAL ECHOCARDIOGRAM (TEE);  Surgeon: Wendall Stade, MD;  Location: [facility] ENDOSCOPY;  Service: Cardiovascular;  Laterality: N/A;  . Cardioversion N/A 08/10/2012    Procedure: CARDIOVERSION;  Surgeon: Wendall Stade, MD;  Location: [facility] ENDOSCOPY;  Service: Cardiovascular;  Laterality: N/A;  . Sp  pta add intra cran  11/2008    a. 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  Allergies: is allergic to losartan and valsartan.   Medications: Prior to Admission medications   Medication Sig Start Date End Date Taking? Authorizing Provider  acyclovir (ZOVIRAX) 800 MG tablet Take 400 mg by mouth daily.   Yes Historical Provider, MD  ALPRAZolam Prudy Feeler) 0.25 MG tablet Take 0.25 mg by mouth at bedtime as needed for sleep.   Yes Historical Provider, MD  amiodarone (PACERONE) 200 MG tablet Take 1 tablet (200 mg total) by mouth 2 (two) times daily. 06/25/12  Yes Scott T Alben Spittle, PA-C  benzonatate  (TESSALON) 200 MG capsule Take 1 capsule (200 mg total) by mouth 3 (three) times daily. 09/04/12  Yes Rhonda G Barrett, PA-C  digoxin (LANOXIN) 0.125 MG tablet Take 0.0625 mg by mouth at bedtime.    Yes Historical Provider, MD  levothyroxine (SYNTHROID, LEVOTHROID) 125 MCG tablet Take 250 mcg by mouth daily before breakfast.   Yes Historical Provider, MD  losartan (COZAAR) 50 MG tablet Take 0.5 tablets (25 mg total) by mouth daily. 09/04/12  Yes Dayna N Dunn, PA-C  metolazone (ZAROXOLYN) 2.5 MG tablet Take 2.5 mg by mouth daily.  06/26/12  Yes Historical Provider, MD  metoprolol (LOPRESSOR) 50 MG tablet Take 2 tablets (100 mg total) by mouth 2 (two) times daily. 09/04/12  Yes Dayna N Dunn, PA-C  potassium chloride SA (K-DUR,KLOR-CON) 20 MEQ tablet Take 2 tablets (40 mEq total) by mouth 2 (two) times daily. 08/11/12  Yes Rhonda G Barrett, PA-C  spironolactone (ALDACTONE) 12.5 mg TABS tablet Take 12.5 mg by mouth daily.   Yes Historical Provider, MD  torsemide (DEMADEX) 20 MG tablet Take 1 tablet (20 mg total) by mouth 2 (two) times daily. 08/11/12  Yes Rhonda G Barrett, PA-C  warfarin (COUMADIN) 7.5 MG tablet Take 7.5 mg by mouth daily.   Yes Historical Provider, MD    Family History:  Family Status  Relation Status Death Age  . Father Deceased 55    Heart attack, diabetes  . Mother Alive     CHF, diabetes  . Sister Alive   . Sister Alive   . Brother Alive     Social History:   reports that he has never smoked. He has never used smokeless tobacco. He reports that he does not drink alcohol or use illicit drugs.   History   Social History Narrative   Lives alone in [place].  Disabled from CVA.      Review of Systems:  He has been obese for a long time. He has had a significant cough recently but has not had fever or productive sputum. He has some mild urinary incontinence. He does not way on a regular basis for unclear reasons. He does have some episodic indigestion. Other than as noted  above, the remainder of the review of systems is normal  Physical Exam: BP 137/112  Pulse 98  Temp(Src) 97.5 F (36.4 C) (Oral)  Resp 18  Ht 5\' 9"  (1.753 m)  SpO2 96% General appearance: Very large obese white male who is coughing actively and sitting up in bed, appears uncomfortable. Head: Normocephalic, without obvious abnormality, atraumatic Eyes: conjunctivae/corneas clear. PERRL, EOM's intact. Fundi not examined Neck: no adenopathy, no carotid bruit, supple, symmetrical, trachea midline and JVP appears elevated at 45 Lungs: Bilateral rales Heart: Tachycardia, regular rate and rhythm, normal S1-S2, soft S3 heard, no murmur. Abdomen: soft, non-tender; bowel  sounds normal; no masses,  no organomegaly Rectal: deferred Extremities: 1-2+ edema peripherally, fissure on right heel with callous formation Pulses: Distal pulses present but only 1+ Skin: Skin color, texture, turgor normal. No rashes or lesions Neurologic: Weakness of the left arm, is alert and oriented   Labs: CBC  Recent Labs  01/07/13 1354  WBC 7.4  RBC 4.70  HGB 13.8  HCT 42.8  PLT 327  MCV 91.1  MCH 29.4  MCHC 32.2  RDW 15.9*   CMP   Recent Labs  01/07/13 1354  NA 139  K 4.6  CL 105  CO2 20  GLUCOSE 130*  BUN 23  CREATININE 1.45*  CALCIUM 9.2  GFRNONAA 55*  GFRAA 64*   BNP (last 3 results)  Recent Labs  08/07/12 1135 09/01/12 1144 01/07/13 1354  PROBNP 4323.0* 3262.0* 6367.0*   Cardiac Panel (last 3 results)  Recent Labs  01/07/13 1354  TROPONINI <0.30   Thyroid  Lab Results  Component Value Date   TSH 22.87* 10/25/2012   T3TOTAL 64.1* 07/30/2009   T4TOTAL 5.7 07/23/2009    EKG: Sinus tachycardia, right bundle branch block, left axis deviation  Radiology: Cardiomegaly with pulmonary venous congestion   IMPRESSIONS: 1. Acute on chronic systolic congestive heart failure 2. Cough possible bronchitis 3. Nonischemic cardiomyopathy 4. Hypertension 5. History of  hyperthyroidism and hypothyroidism 6. Atrial fibrillation currently in sinus rhythm 7. Long-term use of anticoagulants 8. Functioning implantable defibrillator  PLAN: Placed on heart failure unit. Intravenous diuresis. Consider antibiotics if he has productive cough. He would likely benefit from the heart failure service because he lives alone and is a high risk for readmission.  Signed: Darden Palmer MD [facility] Card[facility]  01/07/2013, 7:43 PM

## 2013-01-07 NOTE — ED Provider Notes (Signed)
CSN: [id number]     Arrival date & time 01/07/13  1337 History   First MD Initiated Contact with Patient 01/07/13 1459     Chief Complaint  Patient presents with  . Cough  . Shortness of Breath   (Consider location/radiation/quality/duration/timing/severity/associated sxs/prior Treatment) Patient is a 52 y.o. male presenting with shortness of breath. The history is provided by the patient.  Shortness of Breath Severity:  Moderate Onset quality:  Gradual Duration:  2 weeks Timing:  Constant Progression:  Worsening Chronicity:  Chronic Context: not fumes, not URI and not weather changes   Relieved by:  Nothing Worsened by:  Exertion Ineffective treatments:  Diuretics Associated symptoms: cough (dry, nonproductive)   Associated symptoms: no chest pain, no fever and no vomiting   Risk factors: no family hx of DVT, no hx of cancer, no oral contraceptive use, no recent surgery and no tobacco use     Past Medical History  Diagnosis Date  . Systolic CHF, chronic   . Non-ischemic cardiomyopathy     a. echo 11/10: EF 15%;   b. cath 10/08: normal cors;  c. Myoview 5/12: Very mild distal ant and apical isch, EF 17% (low risk-medical Rx cont'd);  d.  Echo 4/14:  EF 15%;  e. 05/2009 s/p SJM BiV ICD;  f. 07/2012 TEE: EF 20-25%, diff HK mild MR, mod TR.   Marland Kitchen Atrial fibrillation     a. on coumadin;  b. 07/2012 s/p TEE/DCCV. c. Recurrence 08/2012 in setting of abnormal thyroid panel.  . Hypertension   . Hypothyroidism     s/p RAI therapy  . Obesity   . Non-compliance   . Dyslexia   . 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 RBBB (right bundle branch block)   . 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 Automatic implantable cardioverter-defibrillator in situ   . Cough syncope     a. During 08/2012 adm.   Past Surgical History  Procedure Laterality Date  . Insert / replace / remove pacemaker  06/11/09    [employer]  UNIFY [id number] BIVENTRICULAR AICD SERIAL [id number]  . Knee arthroscopy Left ~ 1995  . Cardiac catheterization      "several time" (09/01/2012)  . Tee without cardioversion N/A 06/23/2012    Procedure: TRANSESOPHAGEAL ECHOCARDIOGRAM (TEE);  Surgeon: Vesta Mixer, MD;  Location: [facility] ENDOSCOPY;  Service: Cardiovascular;  Laterality: N/A;  TEE will be done at 0730   . Cardioversion N/A 07/27/2012    Procedure: CARDIOVERSION;  Surgeon: Vesta Mixer, MD;  Location: [facility] ENDOSCOPY;  Service: Cardiovascular;  Laterality: N/A;  . Tee without cardioversion N/A 08/10/2012    Procedure: TRANSESOPHAGEAL ECHOCARDIOGRAM (TEE);  Surgeon: Wendall Stade, MD;  Location: [facility] ENDOSCOPY;  Service: Cardiovascular;  Laterality: N/A;  . Cardioversion N/A 08/10/2012    Procedure: CARDIOVERSION;  Surgeon: Wendall Stade, MD;  Location: [facility] ENDOSCOPY;  Service: Cardiovascular;  Laterality: N/A;  . Sp pta add intra cran  11/2008    a. 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 Heart disease Father    History  Substance Use Topics  . Smoking status: Never Smoker   . Smokeless tobacco: Never Used  . Alcohol Use: No    Review of Systems  Constitutional: Negative for fever.  Respiratory: Positive for cough (dry, nonproductive) and shortness of  breath (increasing, worse with exertion).   Cardiovascular: Negative for chest pain and leg swelling.  Gastrointestinal: Negative for vomiting and diarrhea.  All other systems reviewed and are negative.    Allergies  Losartan and Valsartan  Home Medications   Current Outpatient Rx  Name  Route  Sig  Dispense  Refill  . acyclovir (ZOVIRAX) 800 MG tablet   Oral   Take 400 mg by mouth daily.         Marland Kitchen ALPRAZolam (XANAX) 0.25 MG tablet   Oral   Take 0.25 mg by mouth at bedtime as needed for sleep.         Marland Kitchen amiodarone (PACERONE) 200 MG tablet   Oral   Take 1 tablet (200 mg total) by mouth 2  (two) times daily.   60 tablet   5   . benzonatate (TESSALON) 200 MG capsule   Oral   Take 1 capsule (200 mg total) by mouth 3 (three) times daily.   20 capsule   0   . digoxin (LANOXIN) 0.125 MG tablet   Oral   Take 0.0625 mg by mouth at bedtime.          Marland Kitchen levothyroxine (SYNTHROID, LEVOTHROID) 125 MCG tablet   Oral   Take 250 mcg by mouth daily before breakfast.         . losartan (COZAAR) 50 MG tablet   Oral   Take 0.5 tablets (25 mg total) by mouth daily.         . metolazone (ZAROXOLYN) 2.5 MG tablet   Oral   Take 2.5 mg by mouth daily.          . metoprolol (LOPRESSOR) 50 MG tablet   Oral   Take 2 tablets (100 mg total) by mouth 2 (two) times daily.         . potassium chloride SA (K-DUR,KLOR-CON) 20 MEQ tablet   Oral   Take 2 tablets (40 mEq total) by mouth 2 (two) times daily.   120 tablet   11   . spironolactone (ALDACTONE) 12.5 mg TABS tablet   Oral   Take 12.5 mg by mouth daily.         Marland Kitchen torsemide (DEMADEX) 20 MG tablet   Oral   Take 1 tablet (20 mg total) by mouth 2 (two) times daily.   60 tablet   11   . warfarin (COUMADIN) 7.5 MG tablet   Oral   Take 7.5 mg by mouth daily.          BP 102/55  Pulse 102  Temp(Src) 97.5 F (36.4 C) (Oral)  Resp 27  Ht 5\' 9"  (1.753 m)  SpO2 96% Physical Exam  Nursing note and vitals reviewed. Constitutional: He is oriented to person, place, and time. He appears well-developed and well-nourished. He appears distressed (mild tachypnea).  HENT:  Head: Normocephalic and atraumatic.  Mouth/Throat: Oropharynx is clear and moist. No oropharyngeal exudate.  Eyes: EOM are normal. Pupils are equal, round, and reactive to light.  Neck: Normal range of motion. Neck supple.  Cardiovascular: Normal rate and regular rhythm.  Exam reveals no friction rub.   No murmur heard. Pulmonary/Chest: Breath sounds normal. He is in respiratory distress (mild tachypnea). He has no wheezes. He has no rales.   Abdominal: Soft. He exhibits no distension. There is no tenderness. There is no rebound.  Musculoskeletal: Normal range of motion. He exhibits no edema.  Neurological: He is alert and oriented to person, place, and time. No  cranial nerve deficit. He exhibits normal muscle tone. Coordination normal.  Skin: No rash noted. He is not diaphoretic.    ED Course  Procedures (including critical care time) Labs Review Labs Reviewed  CBC - Abnormal; Notable for the following:    RDW 15.9 (*)    All other components within normal limits  BASIC METABOLIC PANEL  PRO B NATRIURETIC PEPTIDE  TROPONIN I  PROTIME-INR  TSH   Imaging Review Dg Chest 2 View  01/07/2013   CLINICAL DATA:  Cough and shortness of breath.  EXAM: CHEST  2 VIEW  COMPARISON:  Chest x-ray 09/01/2012.  FINDINGS: Films are underpenetrated, decreasing the diagnostic sensitivity and specificity of the examination. With these limitations in mind, there are no definite focal airspace opacities. No definite pleural effusions. Cephalization of the pulmonary vasculature is noted, without frank pulmonary edema. Moderate cardiomegaly redemonstrated. Upper mediastinal contours are within normal limits. Left-sided pacemaker/ stretched at left-sided biventricular pacemaker/AICD in position with lead tips projecting over the expected location of the right atrium, right ventricular apex and overlying the lateral wall of the left ventricle via the coronary sinus and coronary veins.  IMPRESSION: 1. Cardiomegaly with pulmonary venous congestion, but no frank pulmonary edema.   Electronically Signed   By: Trudie Reed M.D.   On: 01/07/2013 14:43    EKG Interpretation    Date/Time:  Sunday January 07 2013 13:43:57 EST Ventricular Rate:  102 PR Interval:  192 QRS Duration: 158 QT Interval:  394 QTC Calculation: 513 R Axis:   -114 Text Interpretation:  Atrial-sensed ventricular-paced rhythm Biventricular pacemaker detected Abnormal ECG No  significant change since last tracing Confirmed by Gwendolyn Grant  MD, Valary Manahan ([id number]) on 01/07/2013 3:11:38 PM            MDM   1. CHF exacerbation    12M with hx of CVA, Afib, CHF, last EF ~15%, presenting with increasing SOB. Dry cough. No fevers, chest pain. Patient has had worsening DOE. He took an extra fluid pill today without increased urination.  Patient here with mild tachycardia, stable BPs. No basilar rales. Mild wheezing in lower lungs. No JVD, belly benign. No peripheral edema. EKG without acute ischemia, no significant changes. Concern for CHF exacerbation. CXR without consolidation. BNP elevated. IV lasix given. Cards consulted for admission.     Dagmar Hait, MD 01/08/13 [phone]

## 2013-01-07 NOTE — Progress Notes (Signed)
ANTICOAGULATION CONSULT NOTE - Initial Consult  Pharmacy Consult for Coumadin Indication: atrial fibrillation  Allergies  Allergen Reactions  . Losartan Cough  . Valsartan Cough    Patient Measurements: Height: 5\' 9"  (175.3 cm) IBW/kg (Calculated) : 70.7  Vital Signs: Temp: 97.5 F (36.4 C) (12/21 1350) Temp src: Oral (12/21 1350) BP: 137/112 mmHg (12/21 1918) Pulse Rate: 98 (12/21 1918)  Labs:  Recent Labs  01/07/13 1354 01/07/13 1520  HGB 13.8  --   HCT 42.8  --   PLT 327  --   LABPROT  --  20.6*  INR  --  1.83*  CREATININE 1.45*  --   TROPONINI <0.30  --     The CrCl is unknown because both a height and weight (above a minimum accepted value) are required for this calculation.   Medical History: Past Medical History  Diagnosis Date  . Systolic CHF, chronic   . Non-ischemic cardiomyopathy     a. echo 11/10: EF 15%;   b. cath 10/08: normal cors;  c. Myoview 5/12: Very mild distal ant and apical isch, EF 17% (low risk-medical Rx cont'd);  d.  Echo 4/14:  EF 15%;  e. 05/2009 s/p SJM BiV ICD;  f. 07/2012 TEE: EF 20-25%, diff HK mild MR, mod TR.   Marland Kitchen Atrial fibrillation     a. on coumadin;  b. 07/2012 s/p TEE/DCCV. c. Recurrence 08/2012 in setting of abnormal thyroid panel.  . Hypertension   . Hypothyroidism     s/p RAI therapy  . Obesity   . Non-compliance   . Dyslexia   . 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 RBBB (right bundle branch block)   . 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 Automatic implantable cardioverter-defibrillator in situ   . Cough syncope     a. During 08/2012 adm.    Medications:  See electronic med rec  Assessment: 52 y.o. male presents with cough and SOB. Pt on coumadin PTA for afib (home dose 7.5mg  daily) - last dose taken yesterday. Baseline INR 1.83 (slightly subtherapeutic) on admit. No bleeding noted. CBC stable at baseline.  Goal of Therapy:  INR  2-3 Monitor platelets by anticoagulation protocol: Yes   Plan:  1. Coumadin 9 mg tonight 2. Daily INR  Christoper Fabian, PharmD, BCPS Clinical pharmacist, pager [phone] 01/07/2013,8:16 PM

## 2013-01-07 NOTE — Progress Notes (Signed)
Admitted pt to rm 4E15 from ED, pt alert and oriented, denied pain at this time. Oriented to room, call bell placed within reach, admission assessment done, orders carried out. Filed Vitals:   01/07/13 2151  BP: 120/72  Pulse: 94  Temp: 97.2 F (36.2 C)  Resp: 18   Coron Rossano, [address]

## 2013-01-07 NOTE — ED Notes (Signed)
Pt with hx of chf to ED c/o increasing sob x 2 weeks.  Pt denies any recent weight gain or swelling (though he does not weigh himself).  Pt has been trying to take 1 extra fluid pill per day with some relief.

## 2013-01-08 DIAGNOSIS — I5023 Acute on chronic systolic (congestive) heart failure: Principal | ICD-10-CM

## 2013-01-08 DIAGNOSIS — I4891 Unspecified atrial fibrillation: Secondary | ICD-10-CM

## 2013-01-08 DIAGNOSIS — I2589 Other forms of chronic ischemic heart disease: Secondary | ICD-10-CM

## 2013-01-08 LAB — BASIC METABOLIC PANEL
BUN: 22 mg/dL (ref 6–23)
CO2: 28 mEq/L (ref 19–32)
Chloride: 100 mEq/L (ref 96–112)
Creatinine, Ser: 1.59 mg/dL — ABNORMAL HIGH (ref 0.50–1.35)
GFR calc Af Amer: 57 mL/min — ABNORMAL LOW (ref >90)
Potassium: 3.3 mEq/L — ABNORMAL LOW (ref 3.5–5.1)

## 2013-01-08 LAB — TSH
TSH: 7.981 u[IU]/mL — ABNORMAL HIGH (ref 0.350–4.500)
TSH: 4.883 u[IU]/mL — ABNORMAL HIGH (ref 0.350–4.500)

## 2013-01-08 LAB — PROTIME-INR: Prothrombin Time: 21.4 seconds — ABNORMAL HIGH (ref 11.6–15.2)

## 2013-01-08 MED ORDER — METOLAZONE 2.5 MG PO TABS: 2.5000 mg | ORAL_TABLET | Freq: Two times a day (BID) | ORAL | Status: DC

## 2013-01-08 MED ORDER — METOLAZONE 2.5 MG PO TABS
2.5000 mg | ORAL_TABLET | Freq: Two times a day (BID) | ORAL | Status: DC
  Administered 2013-01-08 – 2013-01-10 (×4): 2.5 mg via ORAL
  Filled 2013-01-08 (×8): qty 1

## 2013-01-08 MED ORDER — WARFARIN SODIUM 4 MG PO TABS
8.0000 mg | ORAL_TABLET | Freq: Once | ORAL | Status: AC
  Administered 2013-01-08: 19:00:00 8 mg via ORAL
  Filled 2013-01-08: qty 2

## 2013-01-08 MED ORDER — POTASSIUM CHLORIDE CRYS ER 20 MEQ PO TBCR
40.0000 meq | EXTENDED_RELEASE_TABLET | Freq: Once | ORAL | Status: AC
  Administered 2013-01-08: 11:00:00 40 meq via ORAL

## 2013-01-08 MED ORDER — FUROSEMIDE 10 MG/ML IJ SOLN
80.0000 mg | Freq: Two times a day (BID) | INTRAMUSCULAR | Status: DC
Start: 2013-01-08 — End: 2013-01-10
  Administered 2013-01-08 – 2013-01-10 (×3): 80 mg via INTRAVENOUS
  Filled 2013-01-08 (×6): qty 8

## 2013-01-08 MED ORDER — MUPIROCIN CALCIUM 2 % EX CREA
TOPICAL_CREAM | Freq: Every day | CUTANEOUS | Status: DC
  Administered 2013-01-08 – 2013-01-10 (×2): via TOPICAL
  Filled 2013-01-08 (×2): qty 15

## 2013-01-08 MED ORDER — BENZONATATE 100 MG PO CAPS
200.0000 mg | ORAL_CAPSULE | Freq: Three times a day (TID) | ORAL | Status: DC | PRN
  Administered 2013-01-08 – 2013-01-09 (×2): 200 mg via ORAL
  Filled 2013-01-08 (×2): qty 2

## 2013-01-08 MED ORDER — PANTOPRAZOLE SODIUM 40 MG PO TBEC
40.0000 mg | DELAYED_RELEASE_TABLET | Freq: Every day | ORAL | Status: DC
  Administered 2013-01-08 – 2013-01-10 (×3): 40 mg via ORAL
  Filled 2013-01-08 (×3): qty 1

## 2013-01-08 NOTE — Consult Note (Signed)
 ELECTROPHYSIOLOGY CONSULT NOTE  Patient ID: Raymond [name] MRN: [id number], DOB/AGE: 04/22/1960   Admit date: 01/07/2013 Date of Consult: 01/08/2013  Primary Physician: Pcp Not In System Primary Cardiologist: Hochrein, MD Primary EP: Klein, MD Reason for Consultation: Tachycardia  History of Present Illness Raymond [name] is a 52 y.o. male with a NICM, EF 20-25%, s/p CRT-D implant, chronic systolic HF, atrial fibrillation, HTN, prior CVA and CKD who was admitted yesterday with acutely decompensated CHF. He is being diuresed with IV Lasix and metolazone. EP has been asked to evaluate for tachycardia and possible loss of CRT / BiV pacing. Mr. [name] reports increasing SOB and cough for nearly 3 weeks. He denies CP. He chronically sleeps on 3 pillows. He denies palpitations or syncope. He reports compliance with medications but does not weigh himself daily.  Past Medical History Past Medical History  Diagnosis Date  . Systolic CHF, chronic   . Non-ischemic cardiomyopathy     a. echo 11/10: EF 15%;   b. cath 10/08: normal cors;  c. Myoview 5/12: Very mild distal ant and apical isch, EF 17% (low risk-medical Rx cont'd);  d.  Echo 4/14:  EF 15%;  e. 05/2009 s/p SJM BiV ICD;  f. 07/2012 TEE: EF 20-25%, diff HK mild MR, mod TR.   . Atrial fibrillation     a. on coumadin;  b. 07/2012 s/p TEE/DCCV. c. Recurrence 08/2012 in setting of abnormal thyroid panel.  . Hypertension   . Hypothyroidism     s/p RAI therapy  . Obesity   . Non-compliance   . Dyslexia   . 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 RBBB (right bundle branch block)   . 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 Automatic implantable cardioverter-defibrillator in situ   . Cough syncope     a. During 08/2012 adm.    Past Surgical History Past Surgical History  Procedure Laterality Date  . Insert / replace / remove pacemaker  06/11/09    [employer]  [id number] BIVENTRICULAR AICD SERIAL #[id number]  . Knee arthroscopy Left ~ 1995  . Cardiac catheterization      "several time" (09/01/2012)  . Tee without cardioversion N/A 06/23/2012    Procedure: TRANSESOPHAGEAL ECHOCARDIOGRAM (TEE);  Surgeon: Philip J Nahser, MD;  Location: [facility] ENDOSCOPY;  Service: Cardiovascular;  Laterality: N/A;  TEE will be done at 0730   . Cardioversion N/A 07/27/2012    Procedure: CARDIOVERSION;  Surgeon: Philip J Nahser, MD;  Location: [facility] ENDOSCOPY;  Service: Cardiovascular;  Laterality: N/A;  . Tee without cardioversion N/A 08/10/2012    Procedure: TRANSESOPHAGEAL ECHOCARDIOGRAM (TEE);  Surgeon: Peter C Nishan, MD;  Location: [facility] ENDOSCOPY;  Service: Cardiovascular;  Laterality: N/A;  . Cardioversion N/A 08/10/2012    Procedure: CARDIOVERSION;  Surgeon: Peter C Nishan, MD;  Location: [facility] ENDOSCOPY;  Service: Cardiovascular;  Laterality: N/A;  . Sp pta add intra cran  11/2008    a. 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 Losartan Cough  . Valsartan Cough    Inpatient Medications . acyclovir  400 mg Oral Daily  . amiodarone  200 mg Oral BID  . digoxin  0.0625 mg Oral QHS  . furosemide  80 mg Intravenous BID  . influenza vac split quadrivalent PF  0.5 mL Intramuscular Tomorrow-1000  . levothyroxine  250 mcg   Oral QAC breakfast  . losartan  25 mg Oral Daily  . metolazone  2.5 mg Oral BID  . metoprolol  100 mg Oral BID  . pantoprazole  40 mg Oral Q0600  . potassium chloride SA  40 mEq Oral BID  . potassium chloride  40 mEq Oral Once  . sodium chloride  3 mL Intravenous Q12H  . spironolactone  12.5 mg Oral Daily  . Warfarin - Pharmacist Dosing Inpatient   Does not apply q1800    Family History Family History  Problem Relation Age of Onset  . Heart disease Father      Social History History   Social History  . Marital Status: Legally Separated    Spouse Name: N/A    Number of  Children: 0  . Years of Education: N/A   Occupational History  . DISABLED    Social History Main Topics  . Smoking status: Never Smoker   . Smokeless tobacco: Never Used  . Alcohol Use: No  . Drug Use: No  . Sexual Activity: Not Currently   Other Topics Concern  . Not on file   Social History Narrative   Lives alone in [place].  Disabled from CVA.      Review of Systems General: No chills, fever, night sweats or weight changes  Cardiovascular:  No chest pain, orthopnea, palpitations, paroxysmal nocturnal dyspnea Dermatological: No rash, lesions or masses Respiratory: + cough +SOB Urologic: No hematuria, dysuria Abdominal: No nausea, vomiting, diarrhea, bright red blood per rectum, melena, or hematemesis Neurologic: No visual changes, weakness, changes in mental status All other systems reviewed and are otherwise negative except as noted above.  Physical Exam Vitals: Blood pressure 102/76, pulse 80, temperature 98.3 F (36.8 C), temperature source Oral, resp. rate 20, height 5' 9" (1.753 m), weight 292 lb 15.9 oz (132.9 kg), SpO2 100.00%.  General: Well developed, pale appearing  52 y.o. male in no acute distress. HEENT: Normocephalic, atraumatic. EOMs intact. Sclera nonicteric. Oropharynx clear.  Neck: Supple. +JVD. Lungs: Frequent cough. Respirations regular and unlabored, CTA bilaterally. No wheezes, rales or rhonchi. Heart: Regular. S1, S2 present. No murmurs, rub, S3 or S4. Abdomen: Soft, non-tender, non-distended. BS present x 4 quadrants. No hepatosplenomegaly.  Extremities: No clubbing or cyanosis. 2+ pedal edema bilaterally. DP/PT/Radials 2+ and equal bilaterally. Psych: Normal affect. Neuro: Alert and oriented X 3. Moves all extremities spontaneously. Musculoskeletal: No kyphosis. Skin: Intact. Warm and dry. No rashes or petechiae in exposed areas.   Labs  Recent Labs  01/07/13 1354  TROPONINI <0.30   Lab Results  Component Value Date   WBC 7.4  01/07/2013   HGB 13.8 01/07/2013   HCT 42.8 01/07/2013   MCV 91.1 01/07/2013   PLT 327 01/07/2013    Recent Labs Lab 01/07/13 2205 01/08/13 0545  NA 136 138  K 3.7 3.3*  CL 100 100  CO2 24 28  BUN 22 22  CREATININE 1.51* 1.59*  CALCIUM 8.9 8.6  PROT 6.9  --   BILITOT 0.7  --   ALKPHOS 118*  --   ALT 22  --   AST 38*  --   GLUCOSE 113* 93    Recent Labs  01/07/13 1520  TSH 7.981*    Recent Labs  01/08/13 0545  INR 1.92*    Radiology/Studies Dg Chest 2 View 01/07/2013   CLINICAL DATA:  Cough and shortness of breath.  EXAM: CHEST  2 VIEW  COMPARISON:  Chest x-ray 09/01/2012.  FINDINGS: Films are underpenetrated,   decreasing the diagnostic sensitivity and specificity of the examination. With these limitations in mind, there are no definite focal airspace opacities. No definite pleural effusions. Cephalization of the pulmonary vasculature is noted, without frank pulmonary edema. Moderate cardiomegaly redemonstrated. Upper mediastinal contours are within normal limits. Left-sided pacemaker/ stretched at left-sided biventricular pacemaker/AICD in position with lead tips projecting over the expected location of the right atrium, right ventricular apex and overlying the lateral wall of the left ventricle via the coronary sinus and coronary veins.  IMPRESSION: 1. Cardiomegaly with pulmonary venous congestion, but no frank pulmonary edema.   Electronically Signed   By: Daniel  Entrikin M.D.   On: 01/07/2013 14:43   TEE July 2014 Study Conclusions - Left ventricle: Severely dilated with some spontaneous contrast Systolic function was severely reduced. The estimated ejection fraction was in the range of 20% to 25%. Diffuse hypokinesis. - Aortic valve: No evidence of vegetation. - Mitral valve: Mild regurgitation. - Left atrium: The atrium was moderately dilated.  - Right atrium: The atrium was dilated. - Atrial septum: No defect or patent foramen ovale was identified. -  Tricuspid valve: Moderate regurgitation. - Impressions: Spontaneous constrast in LA adn LAA but no thrombus See separate cardioversion note in EPIC Impression: - Spontaneous constrast in LA and LAA but no thrombus  Telemetry - A sensed V paced rhythm Device interogation - performed by industry - Normal BiV ICD function. Battery status good. Lead measurements stable. In AFib/AFlutter since early December with BiV pacing 74 % of time (23% trigger pacing while mode switched); 0 VT/VF episodes; no programming changes made     Assessment and Plan 1. Atrial fibrillation / atrial flutter  - recurrent despite amiodarone - last DCCV was done July 2014 - on Coumadin but INR subtherapeutic today 2. Acute on chronic systolic HF - now on IV Lasix and metolazone - per HF team 3. Cough - ? etiology, possible acute bronchitis or related to decompensated HF 4. NICM, EF 20-25%, s/p CRT-D implant 5. Hypothyroidism - followed by Dr. Ellison 6. CKD 7. HTN  Dr. Taylor to see Signed, EDMISTEN, BROOKE, PA-C 01/08/2013, 10:41 AM   EP Attending  Patient seen and examined. Agree with above. He has developed atrial fib/flutter with an RVR despite amiodarone. I have recommended AV node ablation which will prevent RVR and worsening CHF. The risks/benefits/goals/expectations of the procedure have been discussed with the patient and his wife and they wish to proceed.  Gregg Taylor,M.D.   

## 2013-01-08 NOTE — Progress Notes (Signed)
ANTICOAGULATION CONSULT NOTE - Follow Up Consult  Pharmacy Consult for Coumadin Indication: atrial fibrillation  Allergies  Allergen Reactions  . Losartan Cough  . Valsartan Cough    Patient Measurements: Height: 5\' 9"  (175.3 cm) Weight: 292 lb 15.9 oz (132.9 kg) IBW/kg (Calculated) : 70.7  Vital Signs: Temp: 98.3 F (36.8 C) (12/22 0602) Temp src: Oral (12/22 1014) BP: 102/76 mmHg (12/22 1014) Pulse Rate: 80 (12/22 1014)  Labs:  Recent Labs  01/07/13 1354 01/07/13 1520 01/07/13 2205 01/08/13 0545  HGB 13.8  --   --   --   HCT 42.8  --   --   --   PLT 327  --   --   --   LABPROT  --  20.6*  --  21.4*  INR  --  1.83*  --  1.92*  CREATININE 1.45*  --  1.51* 1.59*  TROPONINI <0.30  --   --   --     Estimated Creatinine Clearance: 75.2 ml/min (by C-G formula based on Cr of 1.59).   Medications:  Scheduled:  . acyclovir  400 mg Oral Daily  . amiodarone  200 mg Oral BID  . digoxin  0.0625 mg Oral QHS  . furosemide  80 mg Intravenous Q6H  . influenza vac split quadrivalent PF  0.5 mL Intramuscular Tomorrow-1000  . levothyroxine  250 mcg Oral QAC breakfast  . losartan  25 mg Oral Daily  . metolazone  2.5 mg Oral Daily  . metoprolol  100 mg Oral BID  . potassium chloride SA  40 mEq Oral BID  . sodium chloride  3 mL Intravenous Q12H  . spironolactone  12.5 mg Oral Daily  . Warfarin - Pharmacist Dosing Inpatient   Does not apply q1800   Assessment: 51 yo M presented with cough and SOB, on Coumadin PTA for afib (7.5mg  daily).  INR 1.83 (slightly subtherapeutic) on admission.  Pharmacy consulted to continue Coumadin inpatient.  After 9mg  dose last night, INR has risen slightly to 1.92. Initial H/H and plt wnl, no new CBC today.  No reports of bleeding noted.  Goal of Therapy:  INR 2-3 Monitor platelets by anticoagulation protocol: Yes   Plan:  - give Coumadin 8mg  x1 dose tonight - daily INR - monitor for s/s of bleeding  Harrold Donath E. Achilles Dunk, PharmD Clinical  Pharmacist - Resident Pager: [phone] Pharmacy: [phone] 01/08/2013 10:29 AM

## 2013-01-08 NOTE — Plan of Care (Signed)
Problem: Food- and Nutrition-Related Knowledge Deficit (NB-1.1) Goal: Nutrition education Formal process to instruct or train a patient/client in a skill or to impart knowledge to help patients/clients voluntarily manage or modify food choices and eating behavior to maintain or improve health. Outcome: Progressing Nutrition Education Note  RD consulted for nutrition education regarding new onset CHF.  RD provided "Low Sodium Nutrition Therapy" handout from the Academy of [employer]. Pt disinterested in education.  States he already follows a "sodium-free" diet at home.  RD attempted to provide foundation for following diet (i.e.  Fluid build-up) to which pt replies "I'm here because the air is moist.  It makes it hard for me to breathe."  RD readdressed fluid status, however pt states that he "needs a new pump not just pills."  Discouraged intake of processed foods and use of salt shaker. Encouraged pt continue with a low sodium diet at home.   RD discussed why it is important for patient to adhere to diet recommendations, and emphasized the role of fluids, foods to avoid, and importance of weighing self daily. Teach back method used.  Expect fair compliance.  Body mass index is 43.25 kg/(m^2). Pt meets criteria for morbid obesity based on current BMI.  Current diet order is Heart Healthy, patient is consuming approximately 100% of meals at this time. Labs and medications reviewed. No further nutrition interventions warranted at this time. RD contact information provided. If additional nutrition issues arise, please re-consult RD.   Loyce Dys, MS RD LDN Clinical Inpatient Dietitian Pager: [phone] Weekend/After hours pager: [phone]

## 2013-01-08 NOTE — Consult Note (Signed)
WOC wound consult note Reason for Consult: Consult requested for right foot wounds.  Pt has a partial thickness fissure to inner and outer areas of foot.  He states he has callous areas which build up and then split and crack.  He prefers to go barefoot all the time.  He admits to using a dremel tool to shave the callous areas. Wound type: Partial thickness fissure wound to inner foot; .2X3X.2cm; 100% red and dry.  No odor or drainage at this time but pt states it was previously bleeding. Right outer posterior foot near heel area has multiple small areas of partial thickness fissures; none larger than .2X.2cm. No odor or drainage. Foot with very dry calloused skin.  Dressing procedure/placement/frequency: Bactroban to promote moist healing.  Foam dressing to protect from further injury.  Advised pt to wear socks while in the hospital. Please re-consult if further assistance is needed.  Thank-you,  Cammie Mcgee MSN, RN, CWOCN, [place], CNS [phone]

## 2013-01-08 NOTE — Progress Notes (Signed)
Patient: Raymond [name] / Admit Date: 01/07/2013 / Date of Encounter: 01/08/2013, 9:50 AM   Subjective  Feeling somewhat better today, but still with nonproductive cough. No fevers or chills.  He reports that he occasionally misses diuretics because when he has to go from [place] to [place] to get his Coumadin level checked, the police will not allow him to pull over on the side of the road to urinate. He states there are no places he can stop on the way to urinate.   He no longer weighs himself because he says anytime he ever called our office, no one ever called him back. I reassured him he should always be called back and even encouraged him to submit a complaint if he does not hear back.  He says the doctors are just dope pushers and a doctor at urgent care once told him he is on way too much medicine.  He does not understand why the underlying heart problem cannot be fixed. 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 Because he urinates so much, he also drinks a lot of beverages to compensate. He drinks 2 gallons a week of chocolate milk.  His anti-coag clinic notes indicate he is almost always missing 2+ doses.   Objective   Telemetry: ?2:1 atrial tach, also with wider complex tachycardia HR 120s intermittently  Physical Exam: Blood pressure 135/82, pulse 77, temperature 98.3 F (36.8 C), temperature source Oral, resp. rate 21, height 5\' 9"  (1.753 m), weight 292 lb 15.9 oz (132.9 kg), SpO2 95.00%. General: Well developed, well nourished obese WM in no acute distress. Head: Normocephalic, atraumatic, sclera non-icteric, no xanthomas, nares are without discharge. Neck: JVP elevated to jaw. Lungs: Clear bilaterally to auscultation without wheezes, rales, or rhonchi. Breathing is unlabored. Heart: RRR S1 S2 without murmurs, rubs, or gallops.  Abdomen: Soft, non-tender, non-distended with normoactive bowel sounds. No rebound/guarding. Extremities: No clubbing or  cyanosis. 2+ edema. Distal pedal pulses are 2+ and equal bilaterally. Skin break R heel, appearing chronic, nonsuppurative and not erythematous Neuro: Alert and oriented X 3. Moves all extremities spontaneously. Psych:  Responds to questions appropriately with a normal affect.   Intake/Output Summary (Last 24 hours) at 01/08/13 0950 Last data filed at 01/08/13 0805  Gross per 24 hour  Intake    600 ml  Output   1400 ml  Net   -800 ml    Inpatient Medications:  . acyclovir  400 mg Oral Daily  . amiodarone  200 mg Oral BID  . digoxin  0.0625 mg Oral QHS  . furosemide  80 mg Intravenous Q6H  . influenza vac split quadrivalent PF  0.5 mL Intramuscular Tomorrow-1000  . levothyroxine  250 mcg Oral QAC breakfast  . losartan  25 mg Oral Daily  . metolazone  2.5 mg Oral Daily  . metoprolol  100 mg Oral BID  . potassium chloride SA  40 mEq Oral BID  . sodium chloride  3 mL Intravenous Q12H  . spironolactone  12.5 mg Oral Daily  . Warfarin - Pharmacist Dosing Inpatient   Does not apply q1800   Infusions:    Labs:  Recent Labs  01/07/13 2205 01/08/13 0545  NA 136 138  K 3.7 3.3*  CL 100 100  CO2 24 28  GLUCOSE 113* 93  BUN 22 22  CREATININE 1.51* 1.59*  CALCIUM 8.9 8.6    Recent Labs  01/07/13 2205  AST 38*  ALT 22  ALKPHOS 118*  BILITOT 0.7  PROT 6.9  ALBUMIN 3.4*    Recent Labs  01/07/13 1354  WBC 7.4  HGB 13.8  HCT 42.8  MCV 91.1  PLT 327    Recent Labs  01/07/13 1354  TROPONINI <0.30   Radiology/Studies:  Dg Chest 2 View  01/07/2013   CLINICAL DATA:  Cough and shortness of breath.  EXAM: CHEST  2 VIEW  COMPARISON:  Chest x-ray 09/01/2012.  FINDINGS: Films are underpenetrated, decreasing the diagnostic sensitivity and specificity of the examination. With these limitations in mind, there are no definite focal airspace opacities. No definite pleural effusions. Cephalization of the pulmonary vasculature is noted, without frank pulmonary edema. Moderate  cardiomegaly redemonstrated. Upper mediastinal contours are within normal limits. Left-sided pacemaker/ stretched at left-sided biventricular pacemaker/AICD in position with lead tips projecting over the expected location of the right atrium, right ventricular apex and overlying the lateral wall of the left ventricle via the coronary sinus and coronary veins.  IMPRESSION: 1. Cardiomegaly with pulmonary venous congestion, but no frank pulmonary edema.   Electronically Signed   By: Trudie Reed M.D.   On: 01/07/2013 14:43     Assessment and Plan  1. Acute on chronic systolic congestive heart failure/NICM EF 20-25% 07/2012 with BiV-ICD (cath 2008 - normal cors) 2. Cough ? GERD 3. Hypertension  4. History of remote hyperthyroidism & hypothyroidism, now managed in context of amiodarone use managed by Dr. Everardo All 5. PAF currently in sinus rhythm (DCCV 07/2012, recur 08/2012 in setting of abnl TFTs), on Coumadin but INR slightly subtherapeutic 6. 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 Poor insight  Pt seen and examined with Dr. Gala Romney. We spent a lot of time educating him on his disease and trying to reinforce compliance. I am not sure we are getting through to him. Will place dietary consult. Care management consult for home health - that way, he doesn't have to leave the house for the INR check. Add protonix for cough. He is still significantly volume overloaded thus will change to Lasix 80mg  IV BID & add metolazone 2.5mg  BID starting now. Will also interrogate pacer & ask EP to evaluate regarding tele abnormalities. Continue Coumadin. TSH improving from last value, continue to follow with Dr. Everardo All as outpatient.  Signed, Ronie Spies PA-C  Patient seen and examined with Ronie Spies, PA-C. We discussed all aspects of the encounter. I agree with the assessment and plan as stated above. He is beginning to diurese but still significantly volume overloaded. He is drinking way to many fluids. Will fluid restrict and  add metoalzone.   We interrogated device at bedside and showed recurrent AFL for the past 3 days. (it had been quiescent for 3 months) which is likely a part of his recent decompensation. We have asked EP to weigh in.   Long talk about need for better compliance with meds and other HF care but he continues to make excuses why he can't do this. His insight into his HF appears poor.   .sih

## 2013-01-08 NOTE — Progress Notes (Signed)
Utilization Review Completed.Melenda Bielak T12/22/2014  

## 2013-01-09 ENCOUNTER — Encounter (HOSPITAL_COMMUNITY)
Admission: EM | Disposition: A | Discharge status: Home Health | Payer: Self-pay | Source: Home / Self Care | Attending: Cardiology

## 2013-01-09 DIAGNOSIS — I4891 Unspecified atrial fibrillation: Secondary | ICD-10-CM

## 2013-01-09 HISTORY — PX: AV NODE ABLATION: SHX5458

## 2013-01-09 LAB — BASIC METABOLIC PANEL
BUN: 33 mg/dL — ABNORMAL HIGH (ref 6–23)
Calcium: 9 mg/dL (ref 8.4–10.5)
Chloride: 95 mEq/L — ABNORMAL LOW (ref 96–112)
Creatinine, Ser: 2.08 mg/dL — ABNORMAL HIGH (ref 0.50–1.35)
GFR calc Af Amer: 41 mL/min — ABNORMAL LOW (ref >90)
Potassium: 3.2 mEq/L — ABNORMAL LOW (ref 3.5–5.1)

## 2013-01-09 LAB — POCT ACTIVATED CLOTTING TIME: Activated Clotting Time: 171 seconds

## 2013-01-09 LAB — PROTIME-INR: Prothrombin Time: 20.3 seconds — ABNORMAL HIGH (ref 11.6–15.2)

## 2013-01-09 LAB — MAGNESIUM: Magnesium: 1.9 mg/dL (ref 1.5–2.5)

## 2013-01-09 SURGERY — AV NODE ABLATION
Anesthesia: LOCAL

## 2013-01-09 MED ORDER — BUPIVACAINE HCL (PF) 0.25 % IJ SOLN
INTRAMUSCULAR | Status: AC
  Filled 2013-01-09: qty 30

## 2013-01-09 MED ORDER — GUAIFENESIN-CODEINE 100-10 MG/5ML PO SOLN
10.0000 mL | ORAL | Status: DC | PRN
  Administered 2013-01-10: 10 mL via ORAL
  Filled 2013-01-09 (×2): qty 10

## 2013-01-09 MED ORDER — ACETAMINOPHEN 325 MG PO TABS: 650.0000 mg | ORAL_TABLET | ORAL | Status: DC | PRN

## 2013-01-09 MED ORDER — SODIUM CHLORIDE 0.9 % IV SOLN: 250.0000 mL | INTRAVENOUS | Status: DC | PRN

## 2013-01-09 MED ORDER — HEPARIN SODIUM (PORCINE) 1000 UNIT/ML IJ SOLN
INTRAMUSCULAR | Status: AC
  Filled 2013-01-09: qty 1

## 2013-01-09 MED ORDER — HEPARIN (PORCINE) IN NACL 2-0.9 UNIT/ML-% IJ SOLN
INTRAMUSCULAR | Status: AC
  Filled 2013-01-09: qty 500

## 2013-01-09 MED ORDER — LIDOCAINE-EPINEPHRINE 1 %-1:100000 IJ SOLN
10.0000 mL | Freq: Once | INTRAMUSCULAR | Status: AC
  Administered 2013-01-09: 0.5 mL via INTRADERMAL

## 2013-01-09 MED ORDER — FENTANYL CITRATE 0.05 MG/ML IJ SOLN
INTRAMUSCULAR | Status: AC
  Filled 2013-01-09: qty 2

## 2013-01-09 MED ORDER — SODIUM CHLORIDE 0.9 % IJ SOLN: 3.0000 mL | Freq: Two times a day (BID) | INTRAMUSCULAR | Status: DC

## 2013-01-09 MED ORDER — LIDOCAINE-EPINEPHRINE 1 %-1:100000 IJ SOLN
INTRAMUSCULAR | Status: AC
  Filled 2013-01-09: qty 1

## 2013-01-09 MED ORDER — SODIUM CHLORIDE 0.9 % IJ SOLN: 3.0000 mL | INTRAMUSCULAR | Status: DC | PRN

## 2013-01-09 MED ORDER — MIDAZOLAM HCL 5 MG/5ML IJ SOLN
INTRAMUSCULAR | Status: AC
  Filled 2013-01-09: qty 5

## 2013-01-09 MED ORDER — WARFARIN SODIUM 6 MG PO TABS
9.0000 mg | ORAL_TABLET | Freq: Once | ORAL | Status: AC
  Administered 2013-01-09: 9 mg via ORAL
  Filled 2013-01-09 (×2): qty 1

## 2013-01-09 MED ORDER — ONDANSETRON HCL 4 MG/2ML IJ SOLN: 4.0000 mg | Freq: Four times a day (QID) | INTRAMUSCULAR | Status: DC | PRN

## 2013-01-09 NOTE — Progress Notes (Signed)
ANTICOAGULATION CONSULT NOTE - Follow Up Consult  Pharmacy Consult for coumadin Indication: atrial fibrillation  Allergies  Allergen Reactions  . Losartan Cough  . Valsartan Cough    Patient Measurements: Height: 5\' 9"  (175.3 cm) Weight: 287 lb 0.6 oz (130.2 kg) IBW/kg (Calculated) : 70.7 Heparin Dosing Weight:   Vital Signs: Temp: 98.2 F (36.8 C) (12/23 1140) Temp src: Oral (12/23 1140) BP: 97/65 mmHg (12/23 1140) Pulse Rate: 91 (12/23 1140)  Labs:  Recent Labs  01/07/13 1354 01/07/13 1520 01/07/13 2205 01/08/13 0545 01/09/13 0415  HGB 13.8  --   --   --   --   HCT 42.8  --   --   --   --   PLT 327  --   --   --   --   LABPROT  --  20.6*  --  21.4* 20.3*  INR  --  1.83*  --  1.92* 1.79*  CREATININE 1.45*  --  1.51* 1.59* 2.08*  TROPONINI <0.30  --   --   --   --     Estimated Creatinine Clearance: 56.8 ml/min (by C-G formula based on Cr of 2.08).   Medications:  Scheduled:  . acyclovir  400 mg Oral Daily  . amiodarone  200 mg Oral BID  . digoxin  0.0625 mg Oral QHS  . furosemide  80 mg Intravenous BID  . levothyroxine  250 mcg Oral QAC breakfast  . losartan  25 mg Oral Daily  . metolazone  2.5 mg Oral BID  . metoprolol  100 mg Oral BID  . mupirocin cream   Topical Daily  . pantoprazole  40 mg Oral Q0600  . potassium chloride SA  40 mEq Oral BID  . sodium chloride  3 mL Intravenous Q12H  . spironolactone  12.5 mg Oral Daily  . Warfarin - Pharmacist Dosing Inpatient   Does not apply q1800   Infusions:    Assessment: 52 yo male with hx of afib s/p AV node ablation will be continued on coumadin.  INR was subtherapeutic at 1.79.   Goal of Therapy:  INR 2-3 Monitor platelets by anticoagulation protocol: Yes   Plan:  1) Coumadin 9 mg po x1 2) INR in am  Jayko Voorhees, Tsz-Yin 01/09/2013,12:08 PM

## 2013-01-09 NOTE — H&P (View-Only) (Signed)
ELECTROPHYSIOLOGY CONSULT NOTE  Patient ID: Raymond [name] MRN: [id number], DOB/AGE: 50-08-1960   Admit date: 01/07/2013 Date of Consult: 01/08/2013  Primary Physician: Pcp Not In System Primary Cardiologist: Antoine Poche, MD Primary EP: Graciela Husbands, MD Reason for Consultation: Tachycardia  History of Present Illness Raymond [name] is a 52 y.o. male with a NICM, EF 20-25%, s/p CRT-D implant, chronic systolic HF, atrial fibrillation, HTN, prior CVA and CKD who was admitted yesterday with acutely decompensated CHF. He is being diuresed with IV Lasix and metolazone. EP has been asked to evaluate for tachycardia and possible loss of CRT / BiV pacing. [name] reports increasing SOB and cough for nearly 3 weeks. He denies CP. He chronically sleeps on 3 pillows. He denies palpitations or syncope. He reports compliance with medications but does not weigh himself daily.  Past Medical History Past Medical History  Diagnosis Date  . Systolic CHF, chronic   . Non-ischemic cardiomyopathy     a. echo 11/10: EF 15%;   b. cath 10/08: normal cors;  c. Myoview 5/12: Very mild distal ant and apical isch, EF 17% (low risk-medical Rx cont'd);  d.  Echo 4/14:  EF 15%;  e. 05/2009 s/p SJM BiV ICD;  f. 07/2012 TEE: EF 20-25%, diff HK mild MR, mod TR.   Marland Kitchen Atrial fibrillation     a. on coumadin;  b. 07/2012 s/p TEE/DCCV. c. Recurrence 08/2012 in setting of abnormal thyroid panel.  . Hypertension   . Hypothyroidism     s/p RAI therapy  . Obesity   . Non-compliance   . Dyslexia   . 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 RBBB (right bundle branch block)   . 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 Automatic implantable cardioverter-defibrillator in situ   . Cough syncope     a. During 08/2012 adm.    Past Surgical History Past Surgical History  Procedure Laterality Date  . Insert / replace / remove pacemaker  06/11/09    [employer]  [id number] BIVENTRICULAR AICD SERIAL [id number]  . Knee arthroscopy Left ~ 1995  . Cardiac catheterization      "several time" (09/01/2012)  . Tee without cardioversion N/A 06/23/2012    Procedure: TRANSESOPHAGEAL ECHOCARDIOGRAM (TEE);  Surgeon: Vesta Mixer, MD;  Location: [facility] ENDOSCOPY;  Service: Cardiovascular;  Laterality: N/A;  TEE will be done at 0730   . Cardioversion N/A 07/27/2012    Procedure: CARDIOVERSION;  Surgeon: Vesta Mixer, MD;  Location: [facility] ENDOSCOPY;  Service: Cardiovascular;  Laterality: N/A;  . Tee without cardioversion N/A 08/10/2012    Procedure: TRANSESOPHAGEAL ECHOCARDIOGRAM (TEE);  Surgeon: Wendall Stade, MD;  Location: [facility] ENDOSCOPY;  Service: Cardiovascular;  Laterality: N/A;  . Cardioversion N/A 08/10/2012    Procedure: CARDIOVERSION;  Surgeon: Wendall Stade, MD;  Location: [facility] ENDOSCOPY;  Service: Cardiovascular;  Laterality: N/A;  . Sp pta add intra cran  11/2008    a. 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 Losartan Cough  . Valsartan Cough    Inpatient Medications . acyclovir  400 mg Oral Daily  . amiodarone  200 mg Oral BID  . digoxin  0.0625 mg Oral QHS  . furosemide  80 mg Intravenous BID  . influenza vac split quadrivalent PF  0.5 mL Intramuscular Tomorrow-1000  . levothyroxine  250 mcg  Oral QAC breakfast  . losartan  25 mg Oral Daily  . metolazone  2.5 mg Oral BID  . metoprolol  100 mg Oral BID  . pantoprazole  40 mg Oral Q0600  . potassium chloride SA  40 mEq Oral BID  . potassium chloride  40 mEq Oral Once  . sodium chloride  3 mL Intravenous Q12H  . spironolactone  12.5 mg Oral Daily  . Warfarin - Pharmacist Dosing Inpatient   Does not apply q1800    Family History Family History  Problem Relation Age of Onset  . Heart disease Father      Social History History   Social History  . Marital Status: Legally Separated    Spouse Name: N/A    Number of  Children: 0  . Years of Education: N/A   Occupational History  . DISABLED    Social History Main Topics  . Smoking status: Never Smoker   . Smokeless tobacco: Never Used  . Alcohol Use: No  . Drug Use: No  . Sexual Activity: Not Currently   Other Topics Concern  . Not on file   Social History Narrative   Lives alone in [place].  Disabled from CVA.      Review of Systems General: No chills, fever, night sweats or weight changes  Cardiovascular:  No chest pain, orthopnea, palpitations, paroxysmal nocturnal dyspnea Dermatological: No rash, lesions or masses Respiratory: + cough +SOB Urologic: No hematuria, dysuria Abdominal: No nausea, vomiting, diarrhea, bright red blood per rectum, melena, or hematemesis Neurologic: No visual changes, weakness, changes in mental status All other systems reviewed and are otherwise negative except as noted above.  Physical Exam Vitals: Blood pressure 102/76, pulse 80, temperature 98.3 F (36.8 C), temperature source Oral, resp. rate 20, height 5\' 9"  (1.753 m), weight 292 lb 15.9 oz (132.9 kg), SpO2 100.00%.  General: Well developed, pale appearing  52 y.o. male in no acute distress. HEENT: Normocephalic, atraumatic. EOMs intact. Sclera nonicteric. Oropharynx clear.  Neck: Supple. +JVD. Lungs: Frequent cough. Respirations regular and unlabored, CTA bilaterally. No wheezes, rales or rhonchi. Heart: Regular. S1, S2 present. No murmurs, rub, S3 or S4. Abdomen: Soft, non-tender, non-distended. BS present x 4 quadrants. No hepatosplenomegaly.  Extremities: No clubbing or cyanosis. 2+ pedal edema bilaterally. DP/PT/Radials 2+ and equal bilaterally. Psych: Normal affect. Neuro: Alert and oriented X 3. Moves all extremities spontaneously. Musculoskeletal: No kyphosis. Skin: Intact. Warm and dry. No rashes or petechiae in exposed areas.   Labs  Recent Labs  01/07/13 1354  TROPONINI <0.30   Lab Results  Component Value Date   WBC 7.4  01/07/2013   HGB 13.8 01/07/2013   HCT 42.8 01/07/2013   MCV 91.1 01/07/2013   PLT 327 01/07/2013    Recent Labs Lab 01/07/13 2205 01/08/13 0545  NA 136 138  K 3.7 3.3*  CL 100 100  CO2 24 28  BUN 22 22  CREATININE 1.51* 1.59*  CALCIUM 8.9 8.6  PROT 6.9  --   BILITOT 0.7  --   ALKPHOS 118*  --   ALT 22  --   AST 38*  --   GLUCOSE 113* 93    Recent Labs  01/07/13 1520  TSH 7.981*    Recent Labs  01/08/13 0545  INR 1.92*    Radiology/Studies Dg Chest 2 View 01/07/2013   CLINICAL DATA:  Cough and shortness of breath.  EXAM: CHEST  2 VIEW  COMPARISON:  Chest x-ray 09/01/2012.  FINDINGS: Films are underpenetrated,  decreasing the diagnostic sensitivity and specificity of the examination. With these limitations in mind, there are no definite focal airspace opacities. No definite pleural effusions. Cephalization of the pulmonary vasculature is noted, without frank pulmonary edema. Moderate cardiomegaly redemonstrated. Upper mediastinal contours are within normal limits. Left-sided pacemaker/ stretched at left-sided biventricular pacemaker/AICD in position with lead tips projecting over the expected location of the right atrium, right ventricular apex and overlying the lateral wall of the left ventricle via the coronary sinus and coronary veins.  IMPRESSION: 1. Cardiomegaly with pulmonary venous congestion, but no frank pulmonary edema.   Electronically Signed   By: Trudie Reed M.D.   On: 01/07/2013 14:43   TEE July 2014 Study Conclusions - Left ventricle: Severely dilated with some spontaneous contrast Systolic function was severely reduced. The estimated ejection fraction was in the range of 20% to 25%. Diffuse hypokinesis. - Aortic valve: No evidence of vegetation. - Mitral valve: Mild regurgitation. - Left atrium: The atrium was moderately dilated.  - Right atrium: The atrium was dilated. - Atrial septum: No defect or patent foramen ovale was identified. -  Tricuspid valve: Moderate regurgitation. - Impressions: Spontaneous constrast in LA adn LAA but no thrombus See separate cardioversion note in EPIC Impression: - Spontaneous constrast in LA and LAA but no thrombus  Telemetry - A sensed V paced rhythm Device interogation - performed by industry - Normal BiV ICD function. Battery status good. Lead measurements stable. In AFib/AFlutter since early December with BiV pacing 74 % of time (23% trigger pacing while mode switched); 0 VT/VF episodes; no programming changes made     Assessment and Plan 1. Atrial fibrillation / atrial flutter  - recurrent despite amiodarone - last DCCV was done July 2014 - on Coumadin but INR subtherapeutic today 2. Acute on chronic systolic HF - now on IV Lasix and metolazone - per HF team 3. Cough - ? etiology, possible acute bronchitis or related to decompensated HF 4. NICM, EF 20-25%, s/p CRT-D implant 5. Hypothyroidism - followed by Dr. Everardo All 6. CKD 7. HTN  Dr. [name] to see Signed, Rick Duff, PA-C 01/08/2013, 10:41 AM   EP Attending  Patient seen and examined. Agree with above. He has developed atrial fib/flutter with an RVR despite amiodarone. I have recommended AV node ablation which will prevent RVR and worsening CHF. The risks/benefits/goals/expectations of the procedure have been discussed with the patient and his wife and they wish to proceed.  [name].D.

## 2013-01-09 NOTE — Interval H&P Note (Signed)
History and Physical Interval Note:  01/09/2013 7:23 AM  Raymond [name]  has presented today for surgery, with the diagnosis of afib  The various methods of treatment have been discussed with the patient and family. After consideration of risks, benefits and other options for treatment, the patient has consented to  Procedure(s): AV NODE ABLATION (N/A) as a surgical intervention .  The patient's history has been reviewed, patient examined, no change in status, stable for surgery.  I have reviewed the patient's chart and labs.  Questions were answered to the patient's satisfaction.     [name].D.

## 2013-01-09 NOTE — Op Note (Signed)
Raymond [name], STORDAHL NO.:  [id number]  MEDICAL RECORD NO.:  [id number]  LOCATION:  [facility]                         FACILITY:  [facility]  PHYSICIAN:  Doylene Canning. [name], MD    DATE OF BIRTH:  1960-06-11  DATE OF PROCEDURE:  01/09/2013 DATE OF DISCHARGE:                              OPERATIVE REPORT   PROCEDURE PERFORMED:  Ablation of AV node.  INDICATION:  Atrial fibrillation with a rapid ventricular response status post prior BiV ICD insertion.  INTRODUCTION:  The patient is a 52 year old male with a longstanding ischemic cardiomyopathy, chronic systolic heart failure who was admitted to the hospital with atrial fibrillation and rapid ventricular response. Review of his ICD demonstrated rapid atrial fibrillation with reduction and BiV pacing.  This is despite maximal medical therapy including amiodarone.  He is now referred for AV node ablation.  PROCEDURE:  After informed consent was obtained, the patient was taken to the diagnostic EP lab in a fasting state.  After usual preparation and draping, intravenous fentanyl and midazolam was given for sedation. A 7-French quadripolar ablation catheter was inserted percutaneously in the right femoral vein and advanced to the His bundle region.  Mapping was carried out.  The HV interval was approximately 74 milliseconds. Six RF energy applications were delivered to the right side of the AV node, and the patient's AV conduction persisted despite prolonged accelerated junctional rhythm.  At this point, the right femoral artery was punctured with a modest amount of difficulty secondary to his extreme subcutaneous tissue and advanced retrograde across the aortic valve into the left ventricle.  The ablation catheter was withdrawn back towards the left ventricular outflow tract, and a single RF energy application was delivered to this region resulting in creation of complete heart block.  The patient was observed for 15 minutes and  had no recurrent AV conduction.  His pacemaker portion of his defibrillator was reprogrammed to a lower rate of 90 and he was returned to the recovery area.  It should be noted that intravenous heparin 5000 units was given prior to the ablation after going to the left side of his heart.     Doylene Canning. [name], MD     GWT/MEDQ  D:  01/09/2013  T:  01/09/2013  Job:  [id number]

## 2013-01-09 NOTE — Progress Notes (Signed)
Notified on-call [place] MD that patient had 5 beats of V-tach. Went to check on patient and patient was resting. Patient complained of no discomfort or pain. Orders given for lab to draw a Magnesium lab. Will continue to monitor patient to end of shift.

## 2013-01-09 NOTE — CV Procedure (Signed)
AV node ablation without immediate complication. Z#[id number].

## 2013-01-10 DIAGNOSIS — I4891 Unspecified atrial fibrillation: Secondary | ICD-10-CM

## 2013-01-10 LAB — PROTIME-INR
INR: 2.04 — ABNORMAL HIGH (ref 0.00–1.49)
Prothrombin Time: 22.4 seconds — ABNORMAL HIGH (ref 11.6–15.2)

## 2013-01-10 LAB — BASIC METABOLIC PANEL
CO2: 30 mEq/L (ref 19–32)
Calcium: 8.7 mg/dL (ref 8.4–10.5)
Creatinine, Ser: 1.75 mg/dL — ABNORMAL HIGH (ref 0.50–1.35)
GFR calc Af Amer: 51 mL/min — ABNORMAL LOW (ref >90)
GFR calc non Af Amer: 44 mL/min — ABNORMAL LOW (ref >90)

## 2013-01-10 NOTE — Progress Notes (Signed)
ELECTROPHYSIOLOGY ROUNDING NOTE    Patient Name: Raymond [name] Date of Encounter: 01/10/2013    SUBJECTIVE:Patient feels well this morning.  He denies chest pain or shortness of breath.  S/p AVN ablation yesterday  TELEMETRY: Reviewed telemetry pt in atrial fibrillation with ventricular pacing at 90- intermittent NSVT Filed Vitals:   01/09/13 2000 01/09/13 2122 01/10/13 0021 01/10/13 0610  BP:  101/53 143/47 105/33  Pulse: 90 90 93 99  Temp:  97.6 F (36.4 C) 98.2 F (36.8 C) 97.5 F (36.4 C)  TempSrc:  Oral Oral Oral  Resp:  15 17 15   Height:      Weight:    287 lb (130.182 kg)  SpO2: 93% 99% 100% 94%    Intake/Output Summary (Last 24 hours) at 01/10/13 0647 Last data filed at 01/10/13 7829  Gross per 24 hour  Intake   1080 ml  Output    700 ml  Net    380 ml    CURRENT MEDICATIONS: . acyclovir  400 mg Oral Daily  . amiodarone  200 mg Oral BID  . digoxin  0.0625 mg Oral QHS  . furosemide  80 mg Intravenous BID  . levothyroxine  250 mcg Oral QAC breakfast  . losartan  25 mg Oral Daily  . metolazone  2.5 mg Oral BID  . metoprolol  100 mg Oral BID  . mupirocin cream   Topical Daily  . pantoprazole  40 mg Oral Q0600  . potassium chloride SA  40 mEq Oral BID  . sodium chloride  3 mL Intravenous Q12H  . sodium chloride  3 mL Intravenous Q12H  . spironolactone  12.5 mg Oral Daily  . Warfarin - Pharmacist Dosing Inpatient   Does not apply q1800    LABS: Basic Metabolic Panel:  Recent Labs  56/21/30 0415 01/09/13 0500 01/10/13 0355  NA 137  --  135  K 3.2*  --  3.4*  CL 95*  --  95*  CO2 31  --  30  GLUCOSE 103*  --  119*  BUN 33*  --  29*  CREATININE 2.08*  --  1.75*  CALCIUM 9.0  --  8.7  MG  --  1.9  --    Liver Function Tests:  Recent Labs  01/07/13 2205  AST 38*  ALT 22  ALKPHOS 118*  BILITOT 0.7  PROT 6.9  ALBUMIN 3.4*   CBC:  Recent Labs  01/07/13 1354  WBC 7.4  HGB 13.8  HCT 42.8  MCV 91.1  PLT 327   Cardiac  Enzymes:  Recent Labs  01/07/13 1354  TROPONINI <0.30   Thyroid Function Tests:  Recent Labs  01/07/13 2205  TSH 4.883*   INR: 2.04  Radiology/Studies:  Dg Chest 2 View 01/07/2013   CLINICAL DATA:  Cough and shortness of breath.  EXAM: CHEST  2 VIEW  COMPARISON:  Chest x-ray 09/01/2012.  FINDINGS: Films are underpenetrated, decreasing the diagnostic sensitivity and specificity of the examination. With these limitations in mind, there are no definite focal airspace opacities. No definite pleural effusions. Cephalization of the pulmonary vasculature is noted, without frank pulmonary edema. Moderate cardiomegaly redemonstrated. Upper mediastinal contours are within normal limits. Left-sided pacemaker/ stretched at left-sided biventricular pacemaker/AICD in position with lead tips projecting over the expected location of the right atrium, right ventricular apex and overlying the lateral wall of the left ventricle via the coronary sinus and coronary veins.  IMPRESSION: 1. Cardiomegaly with pulmonary venous congestion, but no frank  pulmonary edema.   Electronically Signed   By: Trudie Reed M.D.   On: 01/07/2013 14:43   PHYSICAL EXAM Right groin with small area of ecchymosis but no hematoma.   Active Problems:   Acute on chronic systolic CHF (congestive heart failure)   Acute on chronic clinical systolic heart failure  Atrial fib with an RVR S/P AV node ablation Rec:ok for discharge home. He will go home on home meds except for amiodarone. F/U Dr. Crissie Sickles and Janora Norlander in the next few weeks.   Lewayne Bunting, M.D.

## 2013-01-10 NOTE — Progress Notes (Signed)
Coumadin per pharmacy  Admit Complaint: cough and SOB  Anticoagulation: Pt on Coumadin PTA for afib (home dose 7.5mg  daily), admission INR 1.83 (slightly subtherapeutic), INR up to 2.04  INR goal 2-3  Plan:  - Per note, will be discharged today.  MD planned to resume coumadin 7.5mg  daily at home. Would recommend checking INR this Friday to reassess dosing

## 2013-01-10 NOTE — Discharge Summary (Signed)
ELECTROPHYSIOLOGY DISCHARGE SUMMARY    Patient ID: Raymond [name],  MRN: [id number], DOB/AGE: September 20, 1960 52 y.o.  Admit date: 01/07/2013 Discharge date: 01/10/2013  Primary Care Physician: Not in EPIC Primary Cardiologist: Antoine Poche, MD Primary CHF: Bensimhon, MD  Primary Discharge Diagnosis:  1. Refractory atrial fibrillation w/RVR, now s/p AV node ablation 2. Acute on chronic systolic HF 3. NICM, EF 20-25%, s/p CRT-D implant previously  Secondary Discharge Diagnoses:  1. HTN 2. Hypothyroidism 3. Prior CVA 4. Obesity 5. Medical noncompliance  Procedures This Admission:   1. AV node ablation 01/09/2013 (BiV ICD implanted previously)  History and Hospital Course:  [name] is a 52 year old man with a longstanding non-ischemic cardiomyopathy and chronic systolic heart failure s/p CRT-D implant who was admitted on 01/07/2013 with acute on chronic systolic HF in setting of atrial fibrillation and rapid ventricular response and loss of BiV pacing. He was diuresed. He was evaluated by Dr. [name]. Review of his ICD demonstrated rapid atrial fibrillation with reduction and BiV pacing. This is despite maximal medical therapy including amiodarone. AV node ablation was recommended for control of his refractory AF. On 01/09/2013, he underwent AV node ablation. [name] tolerated this procedure well without any immediate complication. He remains hemodynamically stable and afebrile. His groin site is intact without significant bleeding or hematoma. Amiodarone was discontinued. He has been given discharge instructions including wound care and activity restrictions. He will follow-up in clinic in 1 week. He has been seen, examined and deemed stable for discharge today by Dr. Lewayne Bunting.   Discharge Vitals: Blood pressure 101/75, pulse 99, temperature 97.5 F (36.4 C), temperature source Oral, resp. rate 15, height 5\' 9"  (1.753 m), weight 287 lb (130.182 kg), SpO2 94.00%.   Labs: Lab Results    Component Value Date   WBC 7.4 01/07/2013   HGB 13.8 01/07/2013   HCT 42.8 01/07/2013   MCV 91.1 01/07/2013   PLT 327 01/07/2013    Recent Labs Lab 01/07/13 2205  01/10/13 0355  NA 136  < > 135  K 3.7  < > 3.4*  CL 100  < > 95*  CO2 24  < > 30  BUN 22  < > 29*  CREATININE 1.51*  < > 1.75*  CALCIUM 8.9  < > 8.7  PROT 6.9  --   --   BILITOT 0.7  --   --   ALKPHOS 118*  --   --   ALT 22  --   --   AST 38*  --   --   GLUCOSE 113*  < > 119*  < > = values in this interval not displayed. Lab Results  Component Value Date   CKTOTAL 171 11/18/2008   CKMB 3.0 11/18/2008   TROPONINI <0.30 01/07/2013    Lab Results  Component Value Date   CHOL  Value: 135        ATP III CLASSIFICATION:  <200     mg/dL   Desirable  629-528  mg/dL   Borderline High  >=413    mg/dL   High        24/04/100   Lab Results  Component Value Date   HDL 32* 11/19/2008   Lab Results  Component Value Date   LDLCALC  Value: 79        Total Cholesterol/HDL:CHD Risk Coronary Heart Disease Risk Table                     Men  Women  1/2 Average Risk   3.4   3.3  Average Risk       5.0   4.4  2 X Average Risk   9.6   7.1  3 X Average Risk  23.4   11.0        Use the calculated Patient Ratio above and the CHD Risk Table to determine the patient's CHD Risk.        ATP III CLASSIFICATION (LDL):  <100     mg/dL   Optimal  161-096  mg/dL   Near or Above                    Optimal  130-159  mg/dL   Borderline  045-409  mg/dL   High  >811     mg/dL   Very High 91/04/7827   Lab Results  Component Value Date   TRIG 118 11/19/2008   Lab Results  Component Value Date   CHOLHDL 4.2 11/19/2008   No results found for this basename: LDLDIRECT   No results found for this basename: DDIMER    Recent Labs  01/10/13 0355  INR 2.04*    Disposition:  The patient is being discharged in stable condition.  Follow-up:     Follow-up Information   Follow up with Rick Duff, PA-C On 01/16/2013. (At 3:00 PM for hospital  follow-up)    Specialty:  Cardiology   Contact information:   [address] [place] [address] [phone]       Follow up with [facility] On 01/12/2013. (At 12:30 PM for Coumadin follow-up)    Specialty:  Cardiology   Contact information:   [address] [place] [address] [phone]      Discharge Medications:    Medication List    STOP taking these medications       amiodarone 200 MG tablet  Commonly known as:  PACERONE     benzonatate 200 MG capsule  Commonly known as:  TESSALON      TAKE these medications       acyclovir 800 MG tablet  Commonly known as:  ZOVIRAX  Take 400 mg by mouth daily.     ALPRAZolam 0.25 MG tablet  Commonly known as:  XANAX  Take 0.25 mg by mouth at bedtime as needed for sleep.     digoxin 0.125 MG tablet  Commonly known as:  LANOXIN  Take 0.0625 mg by mouth at bedtime.     levothyroxine 125 MCG tablet  Commonly known as:  SYNTHROID, LEVOTHROID  Take 250 mcg by mouth daily before breakfast.     losartan 50 MG tablet  Commonly known as:  COZAAR  Take 0.5 tablets (25 mg total) by mouth daily.     metoprolol 50 MG tablet  Commonly known as:  LOPRESSOR  Take 2 tablets (100 mg total) by mouth 2 (two) times daily.     potassium chloride SA 20 MEQ tablet  Commonly known as:  K-DUR,KLOR-CON  Take 2 tablets (40 mEq total) by mouth 2 (two) times daily.     spironolactone 12.5 mg Tabs tablet  Commonly known as:  ALDACTONE  Take 12.5 mg by mouth daily.     warfarin 7.5 MG tablet  Commonly known as:  COUMADIN  Take 7.5 mg by mouth daily.      ASK your doctor about these medications       metolazone 2.5 MG tablet  Commonly known as:  ZAROXOLYN  Take 2.5 mg by mouth daily.     torsemide 20 MG tablet  Commonly known as:  DEMADEX  Take 1 tablet (20 mg total) by mouth 2 (two) times daily.        Duration of Discharge Encounter: Greater than 30 minutes including  physician time.  Signed, Rick Duff, PA-C 01/10/2013, 8:33 AM  EP Attending  Patient seen and examined. Agree with above.   [name].D.

## 2013-01-12 ENCOUNTER — Ambulatory Visit (INDEPENDENT_AMBULATORY_CARE_PROVIDER_SITE_OTHER): Payer: Medicare Other | Admitting: Pharmacist

## 2013-01-12 ENCOUNTER — Encounter (HOSPITAL_COMMUNITY): Payer: Self-pay | Admitting: *Deleted

## 2013-01-12 DIAGNOSIS — Z7901 Long term (current) use of anticoagulants: Secondary | ICD-10-CM

## 2013-01-12 DIAGNOSIS — I4891 Unspecified atrial fibrillation: Secondary | ICD-10-CM

## 2013-01-16 ENCOUNTER — Ambulatory Visit (INDEPENDENT_AMBULATORY_CARE_PROVIDER_SITE_OTHER): Payer: Medicare Other | Admitting: Cardiology

## 2013-01-16 ENCOUNTER — Encounter: Payer: Self-pay | Admitting: Cardiovascular Disease

## 2013-01-16 ENCOUNTER — Ambulatory Visit (HOSPITAL_COMMUNITY): Payer: Medicare Other | Attending: Cardiovascular Disease

## 2013-01-16 ENCOUNTER — Ambulatory Visit (INDEPENDENT_AMBULATORY_CARE_PROVIDER_SITE_OTHER): Payer: Medicare Other | Admitting: Pharmacist

## 2013-01-16 ENCOUNTER — Encounter: Payer: Self-pay | Admitting: Cardiology

## 2013-01-16 VITALS — BP 110/82 | HR 91 | Ht 69.0 in | Wt 286.4 lb

## 2013-01-16 DIAGNOSIS — I428 Other cardiomyopathies: Secondary | ICD-10-CM

## 2013-01-16 DIAGNOSIS — R229 Localized swelling, mass and lump, unspecified: Secondary | ICD-10-CM

## 2013-01-16 DIAGNOSIS — S301XXA Contusion of abdominal wall, initial encounter: Secondary | ICD-10-CM

## 2013-01-16 DIAGNOSIS — Z9889 Other specified postprocedural states: Secondary | ICD-10-CM

## 2013-01-16 DIAGNOSIS — Z95810 Presence of automatic (implantable) cardiac defibrillator: Secondary | ICD-10-CM

## 2013-01-16 DIAGNOSIS — I5022 Chronic systolic (congestive) heart failure: Secondary | ICD-10-CM

## 2013-01-16 DIAGNOSIS — R0989 Other specified symptoms and signs involving the circulatory and respiratory systems: Secondary | ICD-10-CM

## 2013-01-16 DIAGNOSIS — Z8673 Personal history of transient ischemic attack (TIA), and cerebral infarction without residual deficits: Secondary | ICD-10-CM | POA: Insufficient documentation

## 2013-01-16 DIAGNOSIS — I4891 Unspecified atrial fibrillation: Secondary | ICD-10-CM

## 2013-01-16 DIAGNOSIS — I10 Essential (primary) hypertension: Secondary | ICD-10-CM | POA: Insufficient documentation

## 2013-01-16 DIAGNOSIS — IMO0002 Reserved for concepts with insufficient information to code with codable children: Secondary | ICD-10-CM

## 2013-01-16 DIAGNOSIS — R1900 Intra-abdominal and pelvic swelling, mass and lump, unspecified site: Secondary | ICD-10-CM | POA: Insufficient documentation

## 2013-01-16 DIAGNOSIS — Z7901 Long term (current) use of anticoagulants: Secondary | ICD-10-CM

## 2013-01-16 LAB — MDC_IDC_ENUM_SESS_TYPE_INCLINIC
Battery Remaining Longevity: 39.6 mo
Brady Statistic RV Percent Paced: 95 %
HighPow Impedance: 51.4541
Implantable Pulse Generator Serial Number: 610510
Lead Channel Impedance Value: 400 Ohm
Lead Channel Impedance Value: 437.5 Ohm
Lead Channel Impedance Value: 812.5 Ohm
Lead Channel Setting Pacing Amplitude: 2.5 V
Lead Channel Setting Pacing Pulse Width: 0.5 ms
MDC IDC MSMT LEADCHNL RA SENSING INTR AMPL: 5 mV
MDC IDC MSMT LEADCHNL RV SENSING INTR AMPL: 12 mV
MDC IDC SESS DTM: 20141230210334
MDC IDC SET LEADCHNL LV PACING AMPLITUDE: 2 V
MDC IDC SET LEADCHNL LV PACING PULSEWIDTH: 0.5 ms
MDC IDC SET LEADCHNL RV SENSING SENSITIVITY: 0.5 mV
MDC IDC STAT BRADY RA PERCENT PACED: 0 %
Zone Setting Detection Interval: 250 ms
Zone Setting Detection Interval: 280 ms
Zone Setting Detection Interval: 350 ms

## 2013-01-16 LAB — POCT INR: INR: 2.2

## 2013-01-16 NOTE — Patient Instructions (Addendum)
RIGHT GROIN ULTRASOUND TO RULE OUT PSEUDOANEURYSM TODAY AT 4:30  You are scheduled for device clinic visit on 02/01/2012 at 12:00 noon.  You will follow-up with Dr. [name] in 3 months.

## 2013-01-17 NOTE — Progress Notes (Signed)
Patient ID: Raymond [name] MRN: [id number], DOB/AGE: [birth year]   Date of Visit: 01/16/2013  Primary Physician: Pcp Not In System Primary Cardiologist: Antoine Poche, MD Primary CHF: Bensimhon, MD Reason for Visit: Hospital follow-up  History of Present Illness  Raymond [name] is a 52 y.o. male with a longstanding non-ischemic cardiomyopathy and chronic systolic heart failure s/p CRT-D implant who was admitted on 01/07/2013 with acute on chronic systolic HF in setting of atrial fibrillation and rapid ventricular response and loss of BiV pacing. He was diuresed. He was evaluated by Dr. [name]. Review of his ICD demonstrated rapid atrial fibrillation with reduction in BiV pacing. This is despite maximal medical therapy including amiodarone. AV node ablation was recommended for control of his refractory AF. On 01/09/2013, he underwent AV node ablation. Amiodarone was discontinued and he was discharged on 01/10/2013.   He presents today for routine electrophysiology followup. Since discharge, he reports his breathing is improved and has no complaints other than "a knot" an d bruising at his groin site. He denies pain, swelling, drainage or fever. He has chronic DOE but denies worsening since discharge. He denies chest pain. He denies palpitations, dizziness, near syncope or syncope. He denies LE swelling, orthopnea or PND. He is compliant with medications and low sodium diet.  Past Medical History Past Medical History  Diagnosis Date  . Systolic CHF, chronic   . Non-ischemic cardiomyopathy     a. echo 11/10: EF 15%;   b. cath 10/08: normal cors;  c. Myoview 5/12: Very mild distal ant and apical isch, EF 17% (low risk-medical Rx cont'd);  d.  Echo 4/14:  EF 15%;  e. 05/2009 s/p SJM BiV ICD;  f. 07/2012 TEE: EF 20-25%, diff HK mild MR, mod TR.   Marland Kitchen Atrial fibrillation     a. on coumadin;  b. 07/2012 s/p TEE/DCCV. c. Recurrence 08/2012 in setting of abnormal thyroid panel.; 12/2012 AVN ablation by Dr [name]  .  Hypertension   . Hypothyroidism     s/p RAI therapy  . Obesity   . Non-compliance   . Dyslexia   . 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 RBBB (right bundle branch block)   . Cough syncope     a. During 08/2012 adm.    Past Surgical History Past Surgical History  Procedure Laterality Date  . Bi-ventricular implantable cardioverter defibrillator  (crt-d)  06/11/09    [employer] [id number] BIVENTRICULAR AICD SERIAL #[id number]  . Knee arthroscopy Left ~ 1995  . Cardiac catheterization      "several time" (09/01/2012)  . Tee without cardioversion N/A 06/23/2012    Procedure: TRANSESOPHAGEAL ECHOCARDIOGRAM (TEE);  Surgeon: Vesta Mixer, MD;  Location: [facility] ENDOSCOPY;  Service: Cardiovascular;  Laterality: N/A;  TEE will be done at 0730   . Cardioversion N/A 07/27/2012    Procedure: CARDIOVERSION;  Surgeon: Vesta Mixer, MD;  Location: [facility] ENDOSCOPY;  Service: Cardiovascular;  Laterality: N/A;  . Tee without cardioversion N/A 08/10/2012    Procedure: TRANSESOPHAGEAL ECHOCARDIOGRAM (TEE);  Surgeon: Wendall Stade, MD;  Location: [facility] ENDOSCOPY;  Service: Cardiovascular;  Laterality: N/A;  . Cardioversion N/A 08/10/2012    Procedure: CARDIOVERSION;  Surgeon: Wendall Stade, MD;  Location: [facility] ENDOSCOPY;  Service: Cardiovascular;  Laterality: N/A;  . Sp pta add intra cran  11/2008    a. 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  Ablation  01/09/2013    AVN ablation by Dr [name]    Allergies/Intolerances Allergies  Allergen Reactions  . Losartan Cough  . Valsartan Cough    Current Home Medications Current Outpatient Prescriptions  Medication Sig Dispense Refill  . acyclovir (ZOVIRAX) 800 MG tablet Take 400 mg by mouth daily.      Marland Kitchen ALPRAZolam (XANAX) 0.25 MG tablet Take 0.25 mg by mouth at bedtime as needed for sleep.      Marland Kitchen digoxin (LANOXIN) 0.125 MG tablet Take 0.0625 mg by mouth at  bedtime.       Marland Kitchen levothyroxine (SYNTHROID, LEVOTHROID) 125 MCG tablet Take 250 mcg by mouth daily before breakfast.      . losartan (COZAAR) 50 MG tablet Take 0.5 tablets (25 mg total) by mouth daily.      . metolazone (ZAROXOLYN) 2.5 MG tablet Take 2.5 mg by mouth daily.       . metoprolol (LOPRESSOR) 50 MG tablet Take 2 tablets (100 mg total) by mouth 2 (two) times daily.      . potassium chloride SA (K-DUR,KLOR-CON) 20 MEQ tablet Take 2 tablets (40 mEq total) by mouth 2 (two) times daily.  120 tablet  11  . spironolactone (ALDACTONE) 12.5 mg TABS tablet Take 12.5 mg by mouth daily.      Marland Kitchen torsemide (DEMADEX) 20 MG tablet Take 1 tablet (20 mg total) by mouth 2 (two) times daily.  60 tablet  11  . warfarin (COUMADIN) 7.5 MG tablet Take 7.5 mg by mouth daily.       No current facility-administered medications for this visit.    Social History History   Social History  . Marital Status: Legally Separated    Spouse Name: N/A    Number of Children: 0  . Years of Education: N/A   Occupational History  . DISABLED    Social History Main Topics  . Smoking status: Never Smoker   . Smokeless tobacco: Never Used  . Alcohol Use: No  . Drug Use: No  . Sexual Activity: Not Currently   Other Topics Concern  . Not on file   Social History Narrative   Lives alone in [place].  Disabled from CVA.      Review of Systems General: No chills, fever, night sweats or weight changes Cardiovascular: No chest pain, edema, orthopnea, palpitations, paroxysmal nocturnal dyspnea Dermatological: No rash, lesions or masses Respiratory: No cough, dyspnea Urologic: No hematuria, dysuria Abdominal: No nausea, vomiting, diarrhea, bright red blood per rectum, melena, or hematemesis Neurologic: No visual changes, weakness, changes in mental status All other systems reviewed and are otherwise negative except as noted above.  Physical Exam Vitals: Blood pressure 110/82, pulse 91, height 5\' 9"  (1.753 m),  weight 286 lb 6.4 oz (129.91 kg), SpO2 99.00%.  General: Well developed, chronically ill appearing 52 y.o. male in no acute distress. HEENT: Normocephalic, atraumatic. EOMs intact. Sclera nonicteric. Oropharynx clear.  Neck: Supple. No JVD. Lungs: Respirations regular and unlabored, CTA bilaterally. No wheezes, rales or rhonchi. Heart: Regular. S1, S2 present. No murmurs, rub, S3 or S4. Abdomen: Soft, non-tender, non-distended. BS present x 4 quadrants. Extremities: No clubbing, cyanosis or edema. PT/Radials 2+ and equal bilaterally. Right groin site intact but with moderate ecchymoses extending inferiorly to mid thigh and hematoma. Non-tender to palpation. Pulse intact. No erythema, warmth or drainage.  Psych: Normal affect. Neuro: Alert and oriented X 3. Moves all extremities spontaneously.   Diagnostics Device interrogation - Normal BiV ICD function. No ventricular arrhythmias.  BiV pacing 95% of time. See PaceArt for full details.  Assessment and Plan 1. Permanent AF  - with uncontrolled rates and decreased BiV pacing, now s/p AV node ablation  - now BiV pacing 95% of time  - lowered base rate to 80 bpm today per protocol post AV node ablation  - return to device clinic in 2 weeks  - return to see Dr. [name] in 3 months  2. Nonischemic CM s/p CRT-D implant  - normal device function  - no ventricular arrhythmias  - lowered base rate to 80 bpm today per protocol post AV node ablation  3. Chronic systolic HF  - stable - continue medical therapy  - counseled regarding low sodium diet and daily weights 4. Right groin hematoma - will order right groin [place] to rule out pseudoaneurysm  Signed, Avika Carbine, PA-C 01/17/2013, 10:18 AM

## 2013-01-25 MED FILL — Medication: Qty: 1 | Status: AC

## 2013-01-29 ENCOUNTER — Other Ambulatory Visit: Payer: Self-pay

## 2013-01-29 NOTE — Telephone Encounter (Signed)
Pharm reqs RF of alprazolam. Dr L, it looks like you Rxd this in 04/2012. I will pend it for review.

## 2013-01-30 ENCOUNTER — Other Ambulatory Visit: Payer: Self-pay

## 2013-01-30 MED ORDER — ALPRAZOLAM 0.25 MG PO TABS: 0.2500 mg | ORAL_TABLET | Freq: Every evening | ORAL | Status: DC | PRN

## 2013-01-30 NOTE — Telephone Encounter (Signed)
Called in.

## 2013-01-31 ENCOUNTER — Encounter: Payer: Self-pay | Admitting: Internal Medicine

## 2013-01-31 ENCOUNTER — Encounter (HOSPITAL_COMMUNITY): Payer: Self-pay | Admitting: Emergency Medicine

## 2013-01-31 ENCOUNTER — Emergency Department (HOSPITAL_COMMUNITY): Payer: Medicare Other

## 2013-01-31 ENCOUNTER — Ambulatory Visit (INDEPENDENT_AMBULATORY_CARE_PROVIDER_SITE_OTHER): Payer: Medicare Other | Admitting: *Deleted

## 2013-01-31 ENCOUNTER — Inpatient Hospital Stay (HOSPITAL_COMMUNITY)
Admission: EM | Admit: 2013-01-31 | Discharge: 2013-02-04 | DRG: 292 | Disposition: A | Discharge status: Home Health | Payer: Medicare Other | Attending: Cardiovascular Disease | Admitting: Cardiovascular Disease

## 2013-01-31 DIAGNOSIS — Z5181 Encounter for therapeutic drug level monitoring: Secondary | ICD-10-CM

## 2013-01-31 DIAGNOSIS — I509 Heart failure, unspecified: Secondary | ICD-10-CM | POA: Diagnosis present

## 2013-01-31 DIAGNOSIS — E039 Hypothyroidism, unspecified: Secondary | ICD-10-CM | POA: Diagnosis present

## 2013-01-31 DIAGNOSIS — Z8673 Personal history of transient ischemic attack (TIA), and cerebral infarction without residual deficits: Secondary | ICD-10-CM

## 2013-01-31 DIAGNOSIS — Z6841 Body Mass Index (BMI) 40.0 and over, adult: Secondary | ICD-10-CM

## 2013-01-31 DIAGNOSIS — I5023 Acute on chronic systolic (congestive) heart failure: Secondary | ICD-10-CM

## 2013-01-31 DIAGNOSIS — I129 Hypertensive chronic kidney disease with stage 1 through stage 4 chronic kidney disease, or unspecified chronic kidney disease: Secondary | ICD-10-CM | POA: Diagnosis present

## 2013-01-31 DIAGNOSIS — I428 Other cardiomyopathies: Secondary | ICD-10-CM

## 2013-01-31 DIAGNOSIS — I429 Cardiomyopathy, unspecified: Secondary | ICD-10-CM

## 2013-01-31 DIAGNOSIS — Z95810 Presence of automatic (implantable) cardiac defibrillator: Secondary | ICD-10-CM

## 2013-01-31 DIAGNOSIS — Z9119 Patient's noncompliance with other medical treatment and regimen: Secondary | ICD-10-CM

## 2013-01-31 DIAGNOSIS — I4891 Unspecified atrial fibrillation: Secondary | ICD-10-CM | POA: Diagnosis present

## 2013-01-31 DIAGNOSIS — R05 Cough: Secondary | ICD-10-CM

## 2013-01-31 DIAGNOSIS — I119 Hypertensive heart disease without heart failure: Secondary | ICD-10-CM

## 2013-01-31 DIAGNOSIS — I5022 Chronic systolic (congestive) heart failure: Secondary | ICD-10-CM

## 2013-01-31 DIAGNOSIS — N183 Chronic kidney disease, stage 3 unspecified: Secondary | ICD-10-CM | POA: Diagnosis present

## 2013-01-31 DIAGNOSIS — Z91199 Patient's noncompliance with other medical treatment and regimen due to unspecified reason: Secondary | ICD-10-CM

## 2013-01-31 DIAGNOSIS — R059 Cough, unspecified: Secondary | ICD-10-CM

## 2013-01-31 DIAGNOSIS — Z7901 Long term (current) use of anticoagulants: Secondary | ICD-10-CM

## 2013-01-31 DIAGNOSIS — R0602 Shortness of breath: Secondary | ICD-10-CM

## 2013-01-31 DIAGNOSIS — I480 Paroxysmal atrial fibrillation: Secondary | ICD-10-CM

## 2013-01-31 DIAGNOSIS — Z8249 Family history of ischemic heart disease and other diseases of the circulatory system: Secondary | ICD-10-CM

## 2013-01-31 LAB — MDC_IDC_ENUM_SESS_TYPE_INCLINIC
Brady Statistic RA Percent Paced: 0 %
Brady Statistic RV Percent Paced: 96 %
Date Time Interrogation Session: 20150114162104
HighPow Impedance: 43.2917
Lead Channel Impedance Value: 400 Ohm
Lead Channel Impedance Value: 662.5 Ohm
Lead Channel Sensing Intrinsic Amplitude: 12 mV
Lead Channel Setting Pacing Amplitude: 2 V
Lead Channel Setting Pacing Pulse Width: 0.5 ms
Lead Channel Setting Sensing Sensitivity: 0.5 mV
MDC IDC MSMT LEADCHNL RA SENSING INTR AMPL: 3.5 mV
MDC IDC MSMT LEADCHNL RV IMPEDANCE VALUE: 412.5 Ohm
MDC IDC PG SERIAL: 610510
MDC IDC SET LEADCHNL RV PACING AMPLITUDE: 2.5 V
MDC IDC SET LEADCHNL RV PACING PULSEWIDTH: 0.5 ms
MDC IDC SET ZONE DETECTION INTERVAL: 250 ms
MDC IDC SET ZONE DETECTION INTERVAL: 280 ms
MDC IDC SET ZONE DETECTION INTERVAL: 350 ms

## 2013-01-31 LAB — BASIC METABOLIC PANEL
BUN: 19 mg/dL (ref 6–23)
CHLORIDE: 102 meq/L (ref 96–112)
CO2: 20 mEq/L (ref 19–32)
Calcium: 8.8 mg/dL (ref 8.4–10.5)
Creatinine, Ser: 1.45 mg/dL — ABNORMAL HIGH (ref 0.50–1.35)
GFR calc non Af Amer: 55 mL/min — ABNORMAL LOW (ref >90)
GFR, EST AFRICAN AMERICAN: 64 mL/min — AB (ref >90)
Glucose, Bld: 120 mg/dL — ABNORMAL HIGH (ref 70–99)
POTASSIUM: 4.2 meq/L (ref 3.7–5.3)
SODIUM: 138 meq/L (ref 137–147)

## 2013-01-31 LAB — CBC
HCT: 40.7 % (ref 39.0–52.0)
Hemoglobin: 12.9 g/dL — ABNORMAL LOW (ref 13.0–17.0)
MCH: 27.9 pg (ref 26.0–34.0)
MCHC: 31.7 g/dL (ref 30.0–36.0)
MCV: 87.9 fL (ref 78.0–100.0)
PLATELETS: 253 10*3/uL (ref 150–400)
RBC: 4.63 MIL/uL (ref 4.22–5.81)
RDW: 16.1 % — ABNORMAL HIGH (ref 11.5–15.5)
WBC: 7.7 10*3/uL (ref 4.0–10.5)

## 2013-01-31 LAB — APTT: aPTT: 38 seconds — ABNORMAL HIGH (ref 24–37)

## 2013-01-31 LAB — PRO B NATRIURETIC PEPTIDE: PRO B NATRI PEPTIDE: 5170 pg/mL — AB (ref 0–125)

## 2013-01-31 LAB — TSH: TSH: 13.751 u[IU]/mL — AB (ref 0.350–4.500)

## 2013-01-31 LAB — DIGOXIN LEVEL: Digoxin Level: 0.6 ng/mL — ABNORMAL LOW (ref 0.8–2.0)

## 2013-01-31 LAB — PROTIME-INR
INR: 2.17 — ABNORMAL HIGH (ref 0.00–1.49)
Prothrombin Time: 23.5 seconds — ABNORMAL HIGH (ref 11.6–15.2)

## 2013-01-31 LAB — POCT I-STAT TROPONIN I: TROPONIN I, POC: 0.08 ng/mL (ref 0.00–0.08)

## 2013-01-31 LAB — POCT INR: INR: 2

## 2013-01-31 MED ORDER — WARFARIN - PHYSICIAN DOSING INPATIENT
Freq: Every day | Status: DC
Start: 2013-02-01 — End: 2013-02-02
  Administered 2013-02-01: 18:00:00

## 2013-01-31 MED ORDER — WARFARIN SODIUM 7.5 MG PO TABS
7.5000 mg | ORAL_TABLET | Freq: Every evening | ORAL | Status: DC
  Administered 2013-01-31 – 2013-02-01 (×2): 7.5 mg via ORAL
  Filled 2013-01-31 (×3): qty 1

## 2013-01-31 MED ORDER — FUROSEMIDE 10 MG/ML IJ SOLN
40.0000 mg | Freq: Once | INTRAMUSCULAR | Status: AC
  Administered 2013-01-31: 40 mg via INTRAVENOUS
  Filled 2013-01-31: qty 4

## 2013-01-31 MED ORDER — WARFARIN VIDEO
1.0000 | Freq: Once | Status: AC
  Administered 2013-02-01: 12:00:00 1

## 2013-01-31 MED ORDER — METOLAZONE 2.5 MG PO TABS
2.5000 mg | ORAL_TABLET | Freq: Every day | ORAL | Status: DC
  Administered 2013-02-01 – 2013-02-04 (×4): 2.5 mg via ORAL
  Filled 2013-01-31 (×4): qty 1

## 2013-01-31 MED ORDER — MORPHINE SULFATE 4 MG/ML IJ SOLN
4.0000 mg | Freq: Once | INTRAMUSCULAR | Status: AC
  Administered 2013-01-31: 4 mg via INTRAVENOUS
  Filled 2013-01-31: qty 1

## 2013-01-31 MED ORDER — DIGOXIN 0.0625 MG HALF TABLET
0.0625 mg | ORAL_TABLET | Freq: Every day | ORAL | Status: DC
  Administered 2013-01-31 – 2013-02-03 (×4): 0.0625 mg via ORAL
  Filled 2013-01-31 (×5): qty 1

## 2013-01-31 MED ORDER — LEVOTHYROXINE SODIUM 125 MCG PO TABS
125.0000 ug | ORAL_TABLET | Freq: Every day | ORAL | Status: DC
  Filled 2013-01-31 (×2): qty 1

## 2013-01-31 MED ORDER — COUMADIN BOOK
1.0000 | Freq: Once | Status: DC
  Filled 2013-01-31: qty 1

## 2013-01-31 MED ORDER — ACYCLOVIR 400 MG PO TABS
400.0000 mg | ORAL_TABLET | Freq: Every day | ORAL | Status: DC
  Administered 2013-02-01 – 2013-02-04 (×4): 400 mg via ORAL
  Filled 2013-01-31 (×4): qty 1

## 2013-01-31 MED ORDER — FUROSEMIDE 10 MG/ML IJ SOLN
40.0000 mg | Freq: Two times a day (BID) | INTRAMUSCULAR | Status: DC
  Administered 2013-02-01 – 2013-02-04 (×6): 40 mg via INTRAVENOUS
  Filled 2013-01-31 (×9): qty 4

## 2013-01-31 MED ORDER — POTASSIUM CHLORIDE CRYS ER 20 MEQ PO TBCR
40.0000 meq | EXTENDED_RELEASE_TABLET | Freq: Two times a day (BID) | ORAL | Status: DC
Start: 2013-01-31 — End: 2013-02-04
  Administered 2013-01-31 – 2013-02-04 (×8): 40 meq via ORAL
  Filled 2013-01-31 (×13): qty 2

## 2013-01-31 MED ORDER — SODIUM CHLORIDE 0.9 % IV SOLN: 250.0000 mL | INTRAVENOUS | Status: DC | PRN

## 2013-01-31 MED ORDER — SODIUM CHLORIDE 0.9 % IJ SOLN: 3.0000 mL | INTRAMUSCULAR | Status: DC | PRN

## 2013-01-31 MED ORDER — SPIRONOLACTONE 12.5 MG HALF TABLET
12.5000 mg | ORAL_TABLET | Freq: Every day | ORAL | Status: DC
  Administered 2013-02-01 – 2013-02-03 (×3): 12.5 mg via ORAL
  Filled 2013-01-31 (×4): qty 1

## 2013-01-31 MED ORDER — LOSARTAN POTASSIUM 25 MG PO TABS
25.0000 mg | ORAL_TABLET | Freq: Every day | ORAL | Status: DC
  Administered 2013-02-01 – 2013-02-04 (×4): 25 mg via ORAL
  Filled 2013-01-31 (×4): qty 1

## 2013-01-31 MED ORDER — ONDANSETRON HCL 4 MG/2ML IJ SOLN: 4.0000 mg | Freq: Four times a day (QID) | INTRAMUSCULAR | Status: DC | PRN

## 2013-01-31 MED ORDER — ACETAMINOPHEN 325 MG PO TABS
650.0000 mg | ORAL_TABLET | ORAL | Status: DC | PRN
  Administered 2013-02-01: 650 mg via ORAL
  Filled 2013-01-31: qty 2

## 2013-01-31 MED ORDER — SODIUM CHLORIDE 0.9 % IJ SOLN
3.0000 mL | Freq: Two times a day (BID) | INTRAMUSCULAR | Status: DC
  Administered 2013-01-31 – 2013-02-03 (×7): 3 mL via INTRAVENOUS

## 2013-01-31 MED ORDER — METOPROLOL TARTRATE 100 MG PO TABS
100.0000 mg | ORAL_TABLET | Freq: Two times a day (BID) | ORAL | Status: DC
  Administered 2013-01-31 – 2013-02-04 (×8): 100 mg via ORAL
  Filled 2013-01-31 (×9): qty 1

## 2013-01-31 NOTE — ED Provider Notes (Signed)
CSN: [id number]     Arrival date & time 01/31/13  1257 History   First MD Initiated Contact with Patient 01/31/13 1343     Chief Complaint  Patient presents with  . Shortness of Breath  . Cough  . Weakness   (Consider location/radiation/quality/duration/timing/severity/associated sxs/prior Treatment) HPI Comments: 53 yo wm with MMP including chg, Afib, CM, RBBB, persistent cough, obesity, HTN, PM/ICD presents to ER with SOB and cough.  Pt has h/o recent admission to [facility] in last December for "PM adjustment treatment of Afib.  Pt denies f/c, cp, abd pain, n/v/d, leg swelling.  Pt reports his girlfriend had the flu.    Pt reports dyspnea on exertion and 3 pillow orthopnea.    Patient is a 53 y.o. male presenting with shortness of breath, cough, and weakness. The history is provided by the patient and a parent.  Shortness of Breath Severity:  Moderate Onset quality:  Gradual Duration:  2 days Timing:  Constant Progression:  Worsening Chronicity:  Recurrent Context: activity   Context: not animal exposure, not emotional upset, not fumes, not known allergens, not occupational exposure, not pollens, not smoke exposure, not strong odors, not URI and not weather changes   Relieved by:  None tried Worsened by:  Nothing tried Ineffective treatments:  None tried Associated symptoms: cough   Associated symptoms: no abdominal pain, no chest pain, no claudication, no diaphoresis, no ear pain, no fever, no headaches, no hemoptysis, no neck pain, no PND, no rash, no vomiting and no wheezing   Cough:    Cough characteristics:  Productive (clear liquid)   Severity:  Moderate   Onset quality:  Gradual   Duration:  3 weeks   Timing:  Intermittent   Chronicity:  Recurrent Cough Associated symptoms: shortness of breath   Associated symptoms: no chest pain, no diaphoresis, no ear pain, no fever, no headaches, no rash and no wheezing   Weakness Associated symptoms include shortness of breath.  Pertinent negatives include no chest pain, no abdominal pain and no headaches.    Past Medical History  Diagnosis Date  . Systolic CHF, chronic   . Non-ischemic cardiomyopathy     a. echo 11/10: EF 15%;   b. cath 10/08: normal cors;  c. Myoview 5/12: Very mild distal ant and apical isch, EF 17% (low risk-medical Rx cont'd);  d.  Echo 4/14:  EF 15%;  e. 05/2009 s/p SJM BiV ICD;  f. 07/2012 TEE: EF 20-25%, diff HK mild MR, mod TR.   Marland Kitchen Atrial fibrillation     a. on coumadin;  b. 07/2012 s/p TEE/DCCV. c. Recurrence 08/2012 in setting of abnormal thyroid panel.; 12/2012 AVN ablation by Dr [name]  . Hypertension   . Hypothyroidism     s/p RAI therapy  . Obesity   . Non-compliance   . Dyslexia   . 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 RBBB (right bundle branch block)   . Cough syncope     a. During 08/2012 adm.   Past Surgical History  Procedure Laterality Date  . Bi-ventricular implantable cardioverter defibrillator  (crt-d)  06/11/09    [employer] UNIFY DE0814-48 BIVENTRICULAR AICD SERIAL #[id number]  . Knee arthroscopy Left ~ 1995  . Cardiac catheterization      "several time" (09/01/2012)  . Tee without cardioversion N/A 06/23/2012    Procedure: TRANSESOPHAGEAL ECHOCARDIOGRAM (TEE);  Surgeon: Vesta Mixer, MD;  Location:  [facility] ENDOSCOPY;  Service: Cardiovascular;  Laterality: N/A;  TEE will be done at 0730   . Cardioversion N/A 07/27/2012    Procedure: CARDIOVERSION;  Surgeon: Vesta MixerPhilip J Nahser, MD;  [facility];  Service: Cardiovascular;  Laterality: N/A;  . Tee without cardioversion N/A 08/10/2012    Procedure: TRANSESOPHAGEAL ECHOCARDIOGRAM (TEE);  Surgeon: Wendall StadePeter C Nishan, MD;  [facility];  Service: Cardiovascular;  Laterality: N/A;  . Cardioversion N/A 08/10/2012    Procedure: CARDIOVERSION;  Surgeon: Wendall StadePeter C Nishan, MD;  [facility];  Service: Cardiovascular;  Laterality: N/A;  . Sp  pta add intra cran  11/2008    a. 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 Ablation  01/09/2013    AVN ablation by Dr Ladona Ridgelaylor   Family History  Problem Relation Age of Onset  . Heart disease Father    History  Substance Use Topics  . Smoking status: Never Smoker   . Smokeless tobacco: Never Used  . Alcohol Use: No    Review of Systems  Constitutional: Positive for activity change. Negative for fever, diaphoresis and appetite change.  HENT: Negative for congestion, dental problem, drooling, ear discharge, ear pain, facial swelling, hearing loss, mouth sores, nosebleeds and postnasal drip.   Eyes: Negative.   Respiratory: Positive for cough and shortness of breath. Negative for apnea, hemoptysis, choking, chest tightness and wheezing.        Symptoms worse with exertion and talking  Cardiovascular: Negative for chest pain, palpitations, claudication, leg swelling and PND.  Gastrointestinal: Positive for nausea and diarrhea. Negative for vomiting, abdominal pain, constipation, blood in stool, anal bleeding and rectal pain.  Endocrine: Negative.   Genitourinary: Negative.   Musculoskeletal: Negative for neck pain.  Skin: Negative for rash.  Neurological: Positive for weakness. Negative for tremors, seizures, syncope, speech difficulty and headaches.    Allergies  Losartan and Valsartan  Home Medications   Current Outpatient Rx  Name  Route  Sig  Dispense  Refill  . acyclovir (ZOVIRAX) 800 MG tablet   Oral   Take 400 mg by mouth daily.         . digoxin (LANOXIN) 0.125 MG tablet   Oral   Take 0.0625 mg by mouth at bedtime.          Marland Kitchen. levothyroxine (SYNTHROID, LEVOTHROID) 125 MCG tablet   Oral   Take 125 mcg by mouth daily before breakfast.          . losartan (COZAAR) 50 MG tablet   Oral   Take 0.5 tablets (25 mg total) by mouth daily.         . metolazone (ZAROXOLYN) 2.5 MG tablet   Oral   Take 2.5 mg by mouth daily.          .  metoprolol (LOPRESSOR) 50 MG tablet   Oral   Take 2 tablets (100 mg total) by mouth 2 (two) times daily.         . potassium chloride SA (K-DUR,KLOR-CON) 20 MEQ tablet   Oral   Take 2 tablets (40 mEq total) by mouth 2 (two) times daily.   120 tablet   11   . spironolactone (ALDACTONE) 12.5 mg TABS tablet   Oral   Take 12.5 mg by mouth daily.         Marland Kitchen. torsemide (DEMADEX) 20 MG tablet   Oral   Take 1 tablet (20 mg total) by mouth 2 (two) times daily.   60 tablet  11   . warfarin (COUMADIN) 7.5 MG tablet   Oral   Take 7.5 mg by mouth every evening.           BP 118/81  Pulse 70  Temp(Src) 98 F (36.7 C) (Oral)  Resp 20  Ht 5\' 11"  (1.803 m)  SpO2 93% Physical Exam  Nursing note and vitals reviewed. Constitutional: He is oriented to person, place, and time. He appears well-developed and well-nourished.  HENT:  Head: Atraumatic.  Mouth/Throat: Oropharynx is clear and moist. No oropharyngeal exudate.  Eyes: Conjunctivae are normal. Right eye exhibits no discharge.  Neck: Normal range of motion. Neck supple. No JVD present.  Cardiovascular: Normal rate, regular rhythm and intact distal pulses.  Exam reveals no friction rub.   No murmur heard. Pulmonary/Chest: Effort normal and breath sounds normal. No stridor.  Several coughing episodes during h and p.  VS - RR 16, HR 70, no increased WOB.    Abdominal: Soft. Bowel sounds are normal. He exhibits no distension and no mass. There is no tenderness. There is no rebound and no guarding.  Musculoskeletal: Normal range of motion. He exhibits no edema and no tenderness.  Neurological: He is alert and oriented to person, place, and time. He has normal reflexes.    ED Course  Procedures (including critical care time) Labs Review Results for orders placed during the hospital encounter of 01/31/13  CBC      Result Value Range   WBC 7.7  4.0 - 10.5 K/uL   RBC 4.63  4.22 - 5.81 MIL/uL   Hemoglobin 12.9 (*) 13.0 - 17.0 g/dL    HCT 78.2  95.6 - 21.3 %   MCV 87.9  78.0 - 100.0 fL   MCH 27.9  26.0 - 34.0 pg   MCHC 31.7  30.0 - 36.0 g/dL   RDW 08.6 (*) 57.8 - 46.9 %   Platelets 253  150 - 400 K/uL  PRO B NATRIURETIC PEPTIDE      Result Value Range   Pro B Natriuretic peptide (BNP) 5170.0 (*) 0 - 125 pg/mL  BASIC METABOLIC PANEL      Result Value Range   Sodium 138  137 - 147 mEq/L   Potassium 4.2  3.7 - 5.3 mEq/L   Chloride 102  96 - 112 mEq/L   CO2 20  19 - 32 mEq/L   Glucose, Bld 120 (*) 70 - 99 mg/dL   BUN 19  6 - 23 mg/dL   Creatinine, Ser 6.29 (*) 0.50 - 1.35 mg/dL   Calcium 8.8  8.4 - 52.8 mg/dL   GFR calc non Af Amer 55 (*) >90 mL/min   GFR calc Af Amer 64 (*) >90 mL/min  PROTIME-INR      Result Value Range   Prothrombin Time 23.5 (*) 11.6 - 15.2 seconds   INR 2.17 (*) 0.00 - 1.49  APTT      Result Value Range   aPTT 38 (*) 24 - 37 seconds  DIGOXIN LEVEL      Result Value Range   Digoxin Level 0.6 (*) 0.8 - 2.0 ng/mL  POCT I-STAT TROPONIN I      Result Value Range   Troponin i, poc 0.08  0.00 - 0.08 ng/mL   Comment 3            CXR: IMPRESSION: Enlargement of cardiac silhouette with pulmonary vascular congestion and AICD.  No acute abnormalities.   Electronically Signed By: Ulyses Southward M.D. On: 01/31/2013 14:06  MDM   1. CHF (congestive heart failure)   2. SOB (shortness of breath)   3. Cough    53 year old white male with multiple medical problems to include nonischemic cardiomyopathy, CHF, atrial fib atrial flutter, pacemaker/ICD placement presents with worsening orthopnea, dyspnea on exertion, and persistent cough.  Patient specifically denies chest pain however he does have significant risk factors. ER workup reveals negative troponin, EKG consistent with paced rhythm.  BNP 5100. Chest x-ray with cardiomegaly and AICD but no other acute abnormalities.  ER course: Patient given morphine 4 mg with significant relief in his cough. He has remained pain-free throughout his ED  stay. Case discussed with cardiology will see patient. I suspect admission will be indicated based on his worsening symptoms. Vital signs are stable he is in no acute distress. Patient turned over to Dr. Judd Lien @ 4:56 PM pending cardiology input. Patient is appropriately anticoagulated and his INR is 2.17. Hold on [facility] or [facility] pending cardiology c/s.        Darlys Gales, MD 01/31/13 [phone]

## 2013-01-31 NOTE — Progress Notes (Signed)
Patient presents to the office to have his base rate lowered from 80bpm to 70bpm s/p AVN ablation. Patient also mentions that he has been experiencing CHF symptoms---SOB, DOE, and cough. Patient denies bloating or edema. Patient's thoracic impedance coorelated with symptoms. An urgent staff message was sent to Dr.Hochrein and nurse. No further changes made this session. AT/AF Burden 39% in VVI mode since 12-30 + Warfarin. Patient BiV paced 96%. Patient will follow up with SK in 3 months.

## 2013-01-31 NOTE — H&P (Signed)
Chief Complaint: CP and SOB Primary Cardiologist: Dr. Percival Spanish  HPI: The patient is a 53 y.o. Male, followed by Dr. Percival Spanish, with a NICM, EF 20-25%, s/p CRT-D implant, chronic systolic HF, atrial fibrillation, HTN, prior CVA and CKD. He was recently admitted, several weeks ago, on 01/07/13 with acute on chronic systolic HF in setting of atrial fibrillation and rapid ventricular response and loss of BiV pacing. He was diuresed. He was evaluated by Dr. Lovena Le. Review of his ICD demonstrated rapid atrial fibrillation with reduction and BiV pacing. This is despite maximal medical therapy including amiodarone. AV node ablation was recommended for control of his refractory AF. On 01/09/2013, he underwent AV node ablation by Dr. Lovena Le.   He presented back to the [facility] ER today with a complaint of DOE and constant cough. This has been occuring over the last several weeks but has progressively worsened. He can only walk a few feet before giving out. His cough is productive with clear colored sputum. He denies any sick contacts. No fever, chills, n/v. He notes associated 3 pillow orthopnea and LEE. No chest pain. No palpations. He admits to nonadherent to his diuretic x 2 days and dietary indiscretion.    Past Medical History  Diagnosis Date  . Systolic CHF, chronic   . Non-ischemic cardiomyopathy     a. echo 11/10: EF 15%;   b. cath 10/08: normal cors;  c. Myoview 5/12: Very mild distal ant and apical isch, EF 17% (low risk-medical Rx cont'd);  d.  Echo 4/14:  EF 15%;  e. 05/2009 s/p SJM BiV ICD;  f. 07/2012 TEE: EF 20-25%, diff HK mild MR, mod TR.   Marland Kitchen Atrial fibrillation     a. on coumadin;  b. 07/2012 s/p TEE/DCCV. c. Recurrence 08/2012 in setting of abnormal thyroid panel.; 12/2012 AVN ablation by Dr Lovena Le  . Hypertension   . Hypothyroidism     s/p RAI therapy  . Obesity   . Non-compliance   . Dyslexia   . 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 tx with tPA->  PTA and stenting  . RBBB (right bundle branch block)   . Cough syncope     a. During 08/2012 adm.    Past Surgical History  Procedure Laterality Date  . Bi-ventricular implantable cardioverter defibrillator  (crt-d)  06/11/09    [employer] UNIFY [id number] BIVENTRICULAR AICD SERIAL #[id number]  . Knee arthroscopy Left ~ 1995  . Cardiac catheterization      "several time" (09/01/2012)  . Tee without cardioversion N/A 06/23/2012    Procedure: TRANSESOPHAGEAL ECHOCARDIOGRAM (TEE);  Surgeon: Thayer Headings, MD;  Location: [facility] ENDOSCOPY;  Service: Cardiovascular;  Laterality: N/A;  TEE will be done at 0730   . Cardioversion N/A 07/27/2012    Procedure: CARDIOVERSION;  Surgeon: Thayer Headings, MD;  Location: [facility] ENDOSCOPY;  Service: Cardiovascular;  Laterality: N/A;  . Tee without cardioversion N/A 08/10/2012    Procedure: TRANSESOPHAGEAL ECHOCARDIOGRAM (TEE);  Surgeon: Josue Hector, MD;  [facility] ENDOSCOPY;  Service: Cardiovascular;  Laterality: N/A;  . Cardioversion N/A 08/10/2012    Procedure: CARDIOVERSION;  Surgeon: Josue Hector, MD;  Location: [facility] ENDOSCOPY;  Service: Cardiovascular;  Laterality: N/A;  . Sp pta add intra cran  11/2008    a. 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 Ablation  01/09/2013    AVN ablation by Dr Lovena Le    Family History  Problem Relation Age of Onset  . Heart disease Father    Social History:  reports that he has never smoked. He has never used smokeless tobacco. He reports that he does not drink alcohol or use illicit drugs.  Allergies:  Allergies  Allergen Reactions  . Losartan Cough  . Valsartan Cough     (Not in a hospital admission)  Results for orders placed during the hospital encounter of 01/31/13 (from the past 48 hour(s))  CBC     Status: Abnormal   Collection Time    01/31/13  1:13 PM      Result Value Range   WBC 7.7  4.0 - 10.5 K/uL   RBC 4.63  4.22 - 5.81 MIL/uL   Hemoglobin 12.9 (*) 13.0 - 17.0  g/dL   HCT 40.7  39.0 - 52.0 %   MCV 87.9  78.0 - 100.0 fL   MCH 27.9  26.0 - 34.0 pg   MCHC 31.7  30.0 - 36.0 g/dL   RDW 16.1 (*) 11.5 - 15.5 %   Platelets 253  150 - 400 K/uL  PRO B NATRIURETIC PEPTIDE     Status: Abnormal   Collection Time    01/31/13  1:13 PM      Result Value Range   Pro B Natriuretic peptide (BNP) 5170.0 (*) 0 - 125 pg/mL  BASIC METABOLIC PANEL     Status: Abnormal   Collection Time    01/31/13  1:13 PM      Result Value Range   Sodium 138  137 - 147 mEq/L   Potassium 4.2  3.7 - 5.3 mEq/L   Chloride 102  96 - 112 mEq/L   CO2 20  19 - 32 mEq/L   Glucose, Bld 120 (*) 70 - 99 mg/dL   BUN 19  6 - 23 mg/dL   Creatinine, Ser 1.45 (*) 0.50 - 1.35 mg/dL   Calcium 8.8  8.4 - 10.5 mg/dL   GFR calc non Af Amer 55 (*) >90 mL/min   GFR calc Af Amer 64 (*) >90 mL/min   Comment: (NOTE)     The eGFR has been calculated using the CKD EPI equation.     This calculation has not been validated in all clinical situations.     eGFR's persistently <90 mL/min signify possible Chronic Kidney     Disease.  POCT I-STAT TROPONIN I     Status: None   Collection Time    01/31/13  1:31 PM      Result Value Range   Troponin i, poc 0.08  0.00 - 0.08 ng/mL   Comment 3            Comment: Due to the release kinetics of cTnI,     a negative result within the first hours     of the onset of symptoms does not rule out     myocardial infarction with certainty.     If myocardial infarction is still suspected,     repeat the test at appropriate intervals.  PROTIME-INR     Status: Abnormal   Collection Time    01/31/13  2:43 PM      Result Value Range   Prothrombin Time 23.5 (*) 11.6 - 15.2 seconds   INR 2.17 (*) 0.00 - 1.49  APTT     Status: Abnormal   Collection Time    01/31/13  2:43 PM      Result Value Range   aPTT 38 (*) 24 -  37 seconds   Comment:            IF BASELINE aPTT IS ELEVATED,     SUGGEST PATIENT RISK ASSESSMENT     BE USED TO DETERMINE APPROPRIATE      ANTICOAGULANT THERAPY.  DIGOXIN LEVEL     Status: Abnormal   Collection Time    01/31/13  2:43 PM      Result Value Range   Digoxin Level 0.6 (*) 0.8 - 2.0 ng/mL   Dg Chest 2 View  01/31/2013   CLINICAL DATA:  Shortness of breath, cough, weakness  EXAM: CHEST  2 VIEW  COMPARISON:  01/07/2013  FINDINGS: Left subclavian transvenous pacemaker/AICD leads project at right atrium, right ventricle and coronary sinus.  Enlargement of cardiac silhouette with pulmonary vascular congestion.  Peribronchial thickening.  No acute infiltrate, pleural effusion or pneumothorax.  Bones unremarkable.  Lower lateral left costal margin partially excluded.  IMPRESSION: Enlargement of cardiac silhouette with pulmonary vascular congestion and AICD.  No acute abnormalities.   Electronically Signed   By: Lavonia Dana M.D.   On: 01/31/2013 14:06    Review of Systems  Constitutional: Positive for malaise/fatigue. Negative for fever and chills.  Respiratory: Positive for cough, sputum production (clear colored) and shortness of breath.   Cardiovascular: Positive for orthopnea, leg swelling and PND. Negative for chest pain and palpitations.  Neurological: Negative for dizziness.  All other systems reviewed and are negative.    Blood pressure 114/87, pulse 69, temperature 98 F (36.7 C), temperature source Oral, resp. rate 18, height 5' 11"  (1.803 m), SpO2 99.00%. Physical Exam  Constitutional: He is oriented to person, place, and time. He appears well-developed and well-nourished. No distress.  Neck: JVD present.  Cardiovascular: Normal rate, regular rhythm, normal heart sounds and intact distal pulses.  Exam reveals no gallop and no friction rub.   No murmur heard. Respiratory: Effort normal. No respiratory distress. He has no wheezes. He has rales (faint RLL).  Musculoskeletal: He exhibits edema (1+ pretibial edema R>L).  Neurological: He is alert and oriented to person, place, and time.  Skin: Skin is warm and  dry. He is not diaphoretic.  Psychiatric: He has a normal mood and affect. His behavior is normal.     Assessment/Plan Principal Problem:   Acute on chronic systolic CHF (congestive heart failure) Active Problems:   Morbid obesity   Biventricular implantable cardioverter-defibrillator in situ   Encounter for long-term (current) use of anticoagulants   Non-ischemic cardiomyopathy   CKD (chronic kidney disease), stage III   Plan: 53 y/o obese WM with h/o NICM, EF 20-25%, s/p CRT-D implant, chronic systolic HF, atrial fibrillation, on Warfarin, h/o CVA, stage III CKD and recent AV node ablation in December, presents to ER with complaints of DOE+ cough. No CP. Physical exam positive for JVD, faint RLL crackles and bilateral LEE. CXR demonstrates enlargement of cardiac silhouette with pulmonary vascular congestion and AICD. BNP is elevated at 5K. We will admit for IV diuresis. Strict I/Os, daily weights and low sodium diet.    Lyda Jester 01/31/2013, 5:45 PM   Agree with note written by Ellen Henri  PAC  Pt with H/O NISCM s/p BiV ICD and AVN ablation, CAF on coumadin AC/ Other probs as outlined. Recent admission last month for CHF. Pt admits to not taking diuretics last few days as well as dietary indiscretion. Now with volume overload, interstitial edema on CXR, elevated BNP. 1-2+ pitting edema. Plan admit for iv diuresis. Pt has  poor understanding of his disease process, NA restriction etc....   Lorretta Harp 01/31/2013 6:09 PM

## 2013-01-31 NOTE — ED Notes (Signed)
Per patient, pacemaker "hooked through heart to take over the beating of the upper chamber".   Patient has increased shortness of breath and productive coughing.   Patient has "extreme weakness".   Has been unable to "walk more than 10 ft without giving out" since procedure in December.   Patient went to doctors office today and he was sent here.

## 2013-02-01 ENCOUNTER — Telehealth: Payer: Self-pay | Admitting: *Deleted

## 2013-02-01 DIAGNOSIS — R05 Cough: Secondary | ICD-10-CM

## 2013-02-01 DIAGNOSIS — N183 Chronic kidney disease, stage 3 unspecified: Secondary | ICD-10-CM

## 2013-02-01 DIAGNOSIS — R059 Cough, unspecified: Secondary | ICD-10-CM

## 2013-02-01 LAB — BASIC METABOLIC PANEL
BUN: 18 mg/dL (ref 6–23)
CALCIUM: 9 mg/dL (ref 8.4–10.5)
CO2: 26 meq/L (ref 19–32)
Chloride: 99 mEq/L (ref 96–112)
Creatinine, Ser: 1.48 mg/dL — ABNORMAL HIGH (ref 0.50–1.35)
GFR calc Af Amer: 62 mL/min — ABNORMAL LOW (ref >90)
GFR, EST NON AFRICAN AMERICAN: 53 mL/min — AB (ref >90)
GLUCOSE: 116 mg/dL — AB (ref 70–99)
POTASSIUM: 3.9 meq/L (ref 3.7–5.3)
SODIUM: 139 meq/L (ref 137–147)

## 2013-02-01 LAB — PROTIME-INR
INR: 2.05 — AB (ref 0.00–1.49)
Prothrombin Time: 22.5 seconds — ABNORMAL HIGH (ref 11.6–15.2)

## 2013-02-01 MED ORDER — LEVOTHYROXINE SODIUM 125 MCG PO TABS
250.0000 ug | ORAL_TABLET | Freq: Every day | ORAL | Status: DC
  Administered 2013-02-01: 250 ug via ORAL
  Administered 2013-02-02: 06:00:00 125 ug via ORAL
  Administered 2013-02-03 – 2013-02-04 (×2): 250 ug via ORAL
  Filled 2013-02-01 (×6): qty 2

## 2013-02-01 MED ORDER — GUAIFENESIN-DM 100-10 MG/5ML PO SYRP
5.0000 mL | ORAL_SOLUTION | ORAL | Status: DC | PRN
  Administered 2013-02-01 (×5): 5 mL via ORAL
  Filled 2013-02-01 (×5): qty 5

## 2013-02-01 MED ORDER — ZOLPIDEM TARTRATE 5 MG PO TABS
10.0000 mg | ORAL_TABLET | Freq: Once | ORAL | Status: AC
  Administered 2013-02-01: 22:00:00 10 mg via ORAL
  Filled 2013-02-01: qty 2

## 2013-02-01 MED ORDER — DM-GUAIFENESIN ER 30-600 MG PO TB12
1.0000 | ORAL_TABLET | Freq: Two times a day (BID) | ORAL | Status: DC
  Administered 2013-02-01 – 2013-02-04 (×7): 1 via ORAL
  Filled 2013-02-01 (×9): qty 1

## 2013-02-01 NOTE — Telephone Encounter (Signed)
Message copied by Sharin Grave on Thu Feb 01, 2013 10:09 AM ------      Message from: Antony Odea      Created: Wed Jan 31, 2013  5:19 PM      Regarding: FW: U/S report                   ----- Message -----         From: Raul Del, RN         Sent: 01/31/2013  12:10 PM           To: [facility] T[facility]      Subject: U/S report                                               Pt seen today in [facility] and Pt states he has not heard anything about U/S report from 01/16/13. Please call pt mom , Shilrey at [phone] or [phone]. ------

## 2013-02-01 NOTE — Progress Notes (Signed)
Patient is refusing evening dose of Lasix and states, "I am too tired to be jumping up and down to the bathroom"; will continue to monitor patient. Lorretta Harp RN

## 2013-02-01 NOTE — Discharge Instructions (Signed)

## 2013-02-01 NOTE — Telephone Encounter (Signed)
Pt in the hospital for treatment

## 2013-02-01 NOTE — Progress Notes (Signed)
During assessing patient, the patient is verbally aggressive and states, "my doctor can go to hell, he doesn't do anything to fix me, I have been here plenty of times and I still have the same problem".  Patient stated that he would sue [facility], patient is stable, alert and oriented x4, will continue to monitor patient. Lorretta Harp RN

## 2013-02-01 NOTE — Telephone Encounter (Signed)
Message copied by Sharin Grave on Thu Feb 01, 2013 10:21 AM ------      Message from: Sebastian Ache.      Created: Wed Jan 31, 2013 12:13 PM      Regarding: Pt c/o       I saw this patient today in the device clinic. He currently has CHF symptoms---cough, congestion, DOE, and orthopnea. He denies swelling or edema. He also states that he does not weigh daily. His thoracic impedance measurement in his ICD indicates that he may be retaining fluid x 13 days. His last labs (dated 12-24) showed a BUN: 29, Creat: 1.75, and K+: 3.4. He takes Torsemide 20 mg BID, Metolazone 2.5 QD, Spironolactone 12.5 QD, and K+ 40 MEQ bid. He would like a call today with any recommendations if possible.                  Thanks             Trudi Ida ------

## 2013-02-01 NOTE — Telephone Encounter (Signed)
Mother called with results of testing (no pseudoaneurysm).  Pt is in the hospital for recurrent CHF.

## 2013-02-01 NOTE — Progress Notes (Signed)
While attempting to give IV Lasix, patient's IV infiltrated, IV was removed and IV team notified; will continue to monitor patient. Lorretta Harp RN

## 2013-02-01 NOTE — Progress Notes (Signed)
Subjective: Breathing is minimally little better.  Can't get any sleep.  Objective: Vital signs in last 24 hours: Temp:  [97.9 F (36.6 C)-98 F (36.7 C)] 98 F (36.7 C) (01/15 0514) Pulse Rate:  [68-72] 70 (01/15 0514) Resp:  [18-30] 20 (01/15 0514) BP: (106-138)/(50-106) 110/67 mmHg (01/15 0514) SpO2:  [92 %-100 %] 100 % (01/15 0514) Weight:  [296 lb 1.6 oz (134.31 kg)-297 lb 1.6 oz (134.764 kg)] 296 lb 1.6 oz (134.31 kg) (01/15 0514) Last BM Date:  (patient does not know)  Intake/Output from previous day: 01/14 0701 - 01/15 0700 In: 360 [P.O.:360] Out: 1750 [Urine:1750] Intake/Output this shift:    Medications Current Facility-Administered Medications  Medication Dose Route Frequency Provider Last Rate Last Dose  . 0.9 %  sodium chloride infusion  250 mL Intravenous PRN Brittainy Simmons, PA-C      . acetaminophen (TYLENOL) tablet 650 mg  650 mg Oral Q4H PRN Brittainy Simmons, PA-C      . acyclovir (ZOVIRAX) tablet 400 mg  400 mg Oral Daily Brittainy Simmons, PA-C      . coumadin book 1 each  1 each Does not apply Once Runell GessJonathan J Berry, MD      . digoxin (LANOXIN) tablet 0.0625 mg  0.0625 mg Oral QHS Brittainy Simmons, PA-C   0.0625 mg at 01/31/13 2234  . furosemide (LASIX) injection 40 mg  40 mg Intravenous BID Brittainy Simmons, PA-C   40 mg at 02/01/13 0819  . guaiFENesin-dextromethorphan (ROBITUSSIN DM) 100-10 MG/5ML syrup 5 mL  5 mL Oral Q4H PRN Runell GessJonathan J Berry, MD   5 mL at 02/01/13 0657  . levothyroxine (SYNTHROID, LEVOTHROID) tablet 125 mcg  125 mcg Oral QAC breakfast Brittainy Simmons, PA-C      . losartan (COZAAR) tablet 25 mg  25 mg Oral Daily Brittainy Simmons, PA-C      . metolazone (ZAROXOLYN) tablet 2.5 mg  2.5 mg Oral Daily Brittainy Simmons, PA-C      . metoprolol (LOPRESSOR) tablet 100 mg  100 mg Oral BID Brittainy Simmons, PA-C   100 mg at 01/31/13 2235  . ondansetron (ZOFRAN) injection 4 mg  4 mg Intravenous Q6H PRN Brittainy Simmons, PA-C      .  potassium chloride SA (K-DUR,KLOR-CON) CR tablet 40 mEq  40 mEq Oral BID Brittainy Simmons, PA-C   40 mEq at 01/31/13 2234  . sodium chloride 0.9 % injection 3 mL  3 mL Intravenous Q12H Brittainy Simmons, PA-C   3 mL at 01/31/13 2239  . sodium chloride 0.9 % injection 3 mL  3 mL Intravenous PRN Brittainy Simmons, PA-C      . spironolactone (ALDACTONE) tablet 12.5 mg  12.5 mg Oral Daily Brittainy Simmons, PA-C      . warfarin (COUMADIN) tablet 7.5 mg  7.5 mg Oral QPM Brittainy Simmons, PA-C   7.5 mg at 01/31/13 2234  . warfarin (COUMADIN) video 1 each  1 each Does not apply Once Runell GessJonathan J Berry, MD      . Warfarin - Physician Dosing Inpatient   Does not apply [id number] Runell GessJonathan J Berry, MD        PE: General appearance: alert, cooperative and no distress Lungs: clear to auscultation bilaterally Heart: regular rate and rhythm, S1, S2 normal, no murmur, click, rub or gallop Abdomen: No distension, nontender. Extremities: 1+ LEE Pulses: 1+ pulses. Skin: Warm and dry Neurologic: Grossly normal  Lab Results:   Recent Labs  01/31/13 1313  WBC 7.7  HGB 12.9*  HCT 40.7  PLT 253   BMET  Recent Labs  01/31/13 1313 02/01/13 0547  NA 138 139  K 4.2 3.9  CL 102 99  CO2 20 26  GLUCOSE 120* 116*  BUN 19 18  CREATININE 1.45* 1.48*  CALCIUM 8.8 9.0   PT/INR  Recent Labs  01/31/13 1206 01/31/13 1443 02/01/13 0547  LABPROT  --  23.5* 22.5*  INR 2.0 2.17* 2.05*     Assessment/Plan   Principal Problem:   Acute on chronic systolic CHF (congestive heart failure) Active Problems:   Morbid obesity   Biventricular implantable cardioverter-defibrillator in situ   Encounter for long-term (current) use of anticoagulants   Non-ischemic cardiomyopathy   CKD (chronic kidney disease), stage III   Systolic CHF, acute on chronic   History of afib and ablation  Plan:  Net fluids: -1.4L.  Discharge weight on 01/10/13 was 287#.  Currently 296.  Lasix at 40mg  IV bid, spironolactone 12.5.Marland Kitchen   BP stable .  V-Pacing for the most part on telemetry, although he has had some 6+ beats of NSVT.  Lopressor 100mg  BID.  INR therapeutic.  Continue current diuretic dosing.  TSH elevated-? Compliance.  Checking FreeT4   LOS: 1 day    HAGER, BRYAN 02/01/2013 9:19 AM  The patient was seen, examined and discussed with Wilburt Finlay, PA-C and I agree with the above.   53 year old male with acute on chronic systolic CHF, readmitted yesterday with cough and SOB, NYHA III, > 10 lbs of weight increase since the last discharge. We wil continue iv Lasix, Crea stable.   Tobias Alexander, Rexene Edison 02/01/2013

## 2013-02-01 NOTE — Progress Notes (Signed)
UR completed. Patient changed to inpatient r/t requiring IV lasix scheduled.  

## 2013-02-02 ENCOUNTER — Encounter (HOSPITAL_COMMUNITY): Payer: Self-pay | Admitting: Physician Assistant

## 2013-02-02 DIAGNOSIS — I119 Hypertensive heart disease without heart failure: Secondary | ICD-10-CM

## 2013-02-02 DIAGNOSIS — I480 Paroxysmal atrial fibrillation: Secondary | ICD-10-CM

## 2013-02-02 LAB — BASIC METABOLIC PANEL
BUN: 22 mg/dL (ref 6–23)
CALCIUM: 9.2 mg/dL (ref 8.4–10.5)
CO2: 24 meq/L (ref 19–32)
CREATININE: 1.58 mg/dL — AB (ref 0.50–1.35)
Chloride: 100 mEq/L (ref 96–112)
GFR calc Af Amer: 57 mL/min — ABNORMAL LOW (ref >90)
GFR calc non Af Amer: 49 mL/min — ABNORMAL LOW (ref >90)
Glucose, Bld: 95 mg/dL (ref 70–99)
Potassium: 4.4 mEq/L (ref 3.7–5.3)
Sodium: 139 mEq/L (ref 137–147)

## 2013-02-02 LAB — PROTIME-INR
INR: 3.39 — ABNORMAL HIGH (ref 0.00–1.49)
INR: 3.11 — AB (ref 0.00–1.49)
PROTHROMBIN TIME: 33 s — AB (ref 11.6–15.2)
Prothrombin Time: 30.9 seconds — ABNORMAL HIGH (ref 11.6–15.2)

## 2013-02-02 LAB — T4, FREE: Free T4: 1.38 ng/dL (ref 0.80–1.80)

## 2013-02-02 MED ORDER — WARFARIN - PHARMACIST DOSING INPATIENT: Freq: Every day | Status: DC

## 2013-02-02 MED ORDER — ZOLPIDEM TARTRATE 5 MG PO TABS
5.0000 mg | ORAL_TABLET | Freq: Every evening | ORAL | Status: DC | PRN
  Administered 2013-02-02 – 2013-02-03 (×2): 5 mg via ORAL
  Filled 2013-02-02 (×2): qty 1

## 2013-02-02 NOTE — Progress Notes (Signed)
Pt. Requesting medication to help him sleep tonight. On cal MD, M. Adolm Joseph, made aware. New orders received and implemented. RN will continue to monitor pt. Makynli Stills, Cheryll Dessert

## 2013-02-02 NOTE — Evaluation (Signed)
Physical Therapy Evaluation Patient Details Name: ANTOINIO [name] MRN: [id number] DOB: [birth year] Today's Date: 02/02/2013 Time: 3128-1188 PT Time Calculation (min): 18 min  PT Assessment / Plan / Recommendation History of Present Illness  The patient is a 53 y.o. Male, followed by Dr. Antoine [name], with a NICM, EF 20-25%, s/p CRT-D implant, chronic systolic HF, atrial fibrillation, HTN, prior CVA and CKDDOE and constant cough. This has been occuring over the last several weeks but has progressively worsened. He can only walk a few feet before giving out  Clinical Impression  Pt moves well despite baseline left hemiparesis however very limited by dyspnea 3/4 with gait and desaturation to 87% on RA with gait. Pt educated for energy conservation and pursed lip breathing with handout provided. Pt will benefit from acute therapy to maximize activity tolerance and function with utilization of energy conservation to decrease burden of care and address below deficits. Pt also with history of several recent falls and will benefit from HHPT to address balance as well as energy conservation and oxygen saturations at home.    PT Assessment  Patient needs continued PT services    Follow Up Recommendations  Home health PT    Does the patient have the potential to tolerate intense rehabilitation      Barriers to Discharge Decreased caregiver support      Equipment Recommendations  None recommended by PT    Recommendations for Other Services     Frequency Min 3X/week    Precautions / Restrictions Precautions Precautions: Fall Precaution Comments: watch sats, left hemiparesis   Pertinent Vitals/Pain 98% at rest RA 87% on RA with gait No pain      Mobility  Bed Mobility Overal bed mobility: Modified Independent Transfers Overall transfer level: Modified independent Ambulation/Gait Ambulation/Gait assistance: Supervision Ambulation Distance (Feet): 160 Feet Assistive device: Straight  cane Gait Pattern/deviations: Step-to pattern General Gait Details: circumduction LLE due to foot drop. Cues throughout for energy conservation and breathing technique    Exercises     PT Diagnosis: Abnormality of gait  PT Problem List: Decreased activity tolerance;Decreased balance;Cardiopulmonary status limiting activity PT Treatment Interventions: Gait training;Functional mobility training;Therapeutic activities;Patient/family education     PT Goals(Current goals can be found in the care plan section) Acute Rehab PT Goals Patient Stated Goal: return home and be able to drive farther PT Goal Formulation: With patient Time For Goal Achievement: 02/16/13 Potential to Achieve Goals: Good  Visit Information  Last PT Received On: 02/02/13 Assistance Needed: +1 History of Present Illness: The patient is a 53 y.o. Male, followed by Dr. Antoine [name], with a NICM, EF 20-25%, s/p CRT-D implant, chronic systolic HF, atrial fibrillation, HTN, prior CVA and CKDDOE and constant cough. This has been occuring over the last several weeks but has progressively worsened. He can only walk a few feet before giving out       Prior Functioning  Home Living Family/patient expects to be discharged to:: Private residence Living Arrangements: Alone Type of Home: House Home Access: Level entry Home Layout: Two level;Able to live on main level with bedroom/bathroom Home Equipment: Gilmer Mor - single point Prior Function Level of Independence: Independent with assistive device(s) Comments: pt with assist for housework 1x/mo and has someone do his grocery shopping Pt states he drives but doesn't like to go far due to syncopal events. Pt also reports several recent falls due to slick floor-advised pt to stop putting pledge on hardwood floor Communication Communication: No difficulties Dominant Hand: Right    Cognition  Cognition Arousal/Alertness: Awake/alert Behavior During Therapy: WFL for tasks  assessed/performed Overall Cognitive Status: Within Functional Limits for tasks assessed    Extremity/Trunk Assessment Upper Extremity Assessment Upper Extremity Assessment: LUE deficits/detail LUE Deficits / Details: 1/5 strength grossly entire LUE Lower Extremity Assessment Lower Extremity Assessment: LLE deficits/detail LLE Deficits / Details: baseline hemiparesis with foot drop grossly 2+/5 hip and knee Cervical / Trunk Assessment Cervical / Trunk Assessment: Normal   Balance Balance Overall balance assessment: History of Falls General Comments General comments (skin integrity, edema, etc.): Pt with need for support of cane with mobility. Did not further assess balance due to fatigue end of session  End of Session PT - End of Session Equipment Utilized During Treatment: Gait belt Activity Tolerance: Patient tolerated treatment well Patient left: in chair;with call bell/phone within reach Nurse Communication: Mobility status  GP     Delorse Lekabor, Isack Lavalley Beth 02/02/2013, 9:24 AM  Delaney MeigsMaija Tabor Jonhatan Hearty, PT [phone]

## 2013-02-02 NOTE — Progress Notes (Signed)
Patient has 9 beat run of Vtach, patient is asymptomatic, PA notified, will continue to monitor patient. Lorretta Harp RN

## 2013-02-02 NOTE — Progress Notes (Signed)
ANTICOAGULATION CONSULT NOTE - Initial Consult  Pharmacy Consult:  Coumadin Indication: atrial fibrillation  Allergies  Allergen Reactions  . Losartan Cough  . Valsartan Cough    Patient Measurements: Height: 5\' 11"  (180.3 cm) Weight: 296 lb 1.2 oz (134.3 kg) IBW/kg (Calculated) : 75.3  Vital Signs: Temp: 98.6 F (37 C) (01/16 1316) Temp src: Oral (01/16 1316) BP: 125/77 mmHg (01/16 1316) Pulse Rate: 70 (01/16 1316)  Labs:  Recent Labs  01/31/13 1313  01/31/13 1443 02/01/13 0547 02/02/13 0532 02/02/13 0920  HGB 12.9*  --   --   --   --   --   HCT 40.7  --   --   --   --   --   PLT 253  --   --   --   --   --   APTT  --   --  38*  --   --   --   LABPROT  --   < > 23.5* 22.5* 33.0* 30.9*  INR  --   < > 2.17* 2.05* 3.39* 3.11*  CREATININE 1.45*  --   --  1.48* 1.58*  --   < > = values in this interval not displayed.  Estimated Creatinine Clearance: 78.2 ml/min (by C-G formula based on Cr of 1.58).   Medical History: Past Medical History  Diagnosis Date  . Systolic CHF, chronic   . Non-ischemic cardiomyopathy     a. echo 11/10: EF 15%;   b. cath 10/08: normal cors;  c. Myoview 5/12: Very mild distal ant and apical isch, EF 17% (low risk-medical Rx cont'd);  d.  Echo 4/14:  EF 15%;  e. 05/2009 s/p SJM BiV ICD;  f. 07/2012 TEE: EF 20-25%, diff HK mild MR, mod TR.   Marland Kitchen Atrial fibrillation     a. on coumadin;  b. 07/2012 s/p TEE/DCCV. c. Recurrence 08/2012 in setting of abnormal thyroid panel.; 12/2012 AVN ablation by Dr [name]  . Hypertension   . Hypothyroidism     s/p RAI therapy  . Obesity   . Non-compliance   . Dyslexia   . 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 RBBB (right bundle branch block)   . Cough syncope     a. During 08/2012 adm.      Assessment: 52 YOM on Coumadin for history of Afib.  INR supra-therapeutic (3.39) on home regimen of Coumadin 7.5mg  PO daily.  Patient reports  being on this regimen for a couple of months and his INR has been stable.  He has been compliant with his med as well.  Repeat INR remains elevated at 3.11.  No bleeding reported.  Goal of Therapy:  INR 2-3 Monitor platelets by anticoagulation protocol: Yes    Plan:  - Hold Coumadin today given significant increase in INR - Daily PT / INR    Shantelle Alles D. Laney Potash, PharmD, BCPS Pager:  [phone] 02/02/2013, 2:51 PM

## 2013-02-02 NOTE — Progress Notes (Signed)
Patient Name: Raymond [name] Date of Encounter: 02/02/2013   Principal Problem:   Acute on chronic systolic CHF (congestive heart failure) Active Problems:   Non-ischemic cardiomyopathy   Morbid obesity   CKD (chronic kidney disease), stage III   Biventricular implantable cardioverter-defibrillator in situ   Encounter for long-term (current) use of anticoagulants   PAF (paroxysmal atrial fibrillation)   SUBJECTIVE  Patient sitting in bedside chair talking with mother. Appears relaxed and in good spirits. Complains of no CP, diaphoresis, N/V. Coughing has decreased and activity tolerance is improving, but needing supplemental oxygen.    CURRENT MEDS . acyclovir  400 mg Oral Daily  . coumadin book  1 each Does not apply Once  . dextromethorphan-guaiFENesin  1 tablet Oral BID  . digoxin  0.0625 mg Oral QHS  . furosemide  40 mg Intravenous BID  . levothyroxine  250 mcg Oral QAC breakfast  . losartan  25 mg Oral Daily  . metolazone  2.5 mg Oral Daily  . metoprolol  100 mg Oral BID  . potassium chloride SA  40 mEq Oral BID  . sodium chloride  3 mL Intravenous Q12H  . spironolactone  12.5 mg Oral Daily  . warfarin  7.5 mg Oral QPM  . Warfarin - Physician Dosing Inpatient   Does not apply q1800    OBJECTIVE  Filed Vitals:   02/01/13 2206 02/02/13 0533 02/02/13 0808 02/02/13 0919  BP:  114/75 130/80   Pulse: 70 69 70   Temp:  97.3 F (36.3 C) 98.5 F (36.9 C)   TempSrc:  Oral Oral   Resp:  21    Height:      Weight:  296 lb 1.2 oz (134.3 kg)    SpO2:  100% 98% 98%    Intake/Output Summary (Last 24 hours) at 02/02/13 1145 Last data filed at 02/02/13 1144  Gross per 24 hour  Intake    723 ml  Output   2414 ml  Net  -1691 ml   Filed Weights   01/31/13 2050 02/01/13 0514 02/02/13 0533  Weight: 297 lb 1.6 oz (134.764 kg) 296 lb 1.6 oz (134.31 kg) 296 lb 1.2 oz (134.3 kg)    PHYSICAL EXAM  General: Pleasant, NAD. Obese caucasian male. Tires easy while conversing.    Neuro: Alert and oriented X 3. Moves all extremities spontaneously. LUE strength 3/5. LLE 4/5 Psych: Normal affect. Appropriately conversant.   HEENT:  Normal. Face appears flushed.  Neck: Supple without bruits. JVD difficult to discern due to neck girth and length. Carotid upstroke 2+.  Lungs:  Resp regular, 25-30 bpm. Labored while speaking. DOE. Bilat lung bases present crackles.  Heart: RRR no s3, s4, or murmurs. Cap refill = 3 sec. Abdomen: Protuberant, soft, non-tender, non-distended, BS + x 4.  Extremities: No clubbing, Purple discoloration noted to digits distal tips and anterior portion of bilateral feet and toes. Extremities warm. 2+ pitting edema bilateral LE. DP/PT/Radials 2+ and equal bilaterally.  Accessory Clinical Findings  CBC  Recent Labs  01/31/13 1313  WBC 7.7  HGB 12.9*  HCT 40.7  MCV 87.9  PLT 253   Basic Metabolic Panel  Recent Labs  02/01/13 0547 02/02/13 0532  NA 139 139  K 3.9 4.4  CL 99 100  CO2 26 24  GLUCOSE 116* 95  BUN 18 22  CREATININE 1.48* 1.58*  CALCIUM 9.0 9.2   Thyroid Function Tests  Recent Labs  01/31/13 1443  TSH 13.751*   TELE NSR,  rate 70, currently paced   Radiology/Studies  Dg Chest 2 View  01/31/2013   CLINICAL DATA:  Shortness of breath, cough, weakness  EXAM: CHEST  2 VIEW  COMPARISON:  01/07/2013  FINDINGS: Left subclavian transvenous pacemaker/AICD leads project at right atrium, right ventricle and coronary sinus.  Enlargement of cardiac silhouette with pulmonary vascular congestion.  Peribronchial thickening.  No acute infiltrate, pleural effusion or pneumothorax.  Bones unremarkable.  Lower lateral left costal margin partially excluded.  IMPRESSION: Enlargement of cardiac silhouette with pulmonary vascular congestion and AICD.  No acute abnormalities.   Electronically Signed   By: Ulyses Southward M.D.   On: 01/31/2013 14:06   ASSESSMENT AND PLAN  1. Acute on Chronic systolic CHF: NYHA III  Readmitted 1/14 with  cough and SOB after being discharged 12/24 post ablation for a-fib.  Since his last admission, he has gained > 10 lbs of weight. Chest x-ray revealed increased cardiac silhouette and pulmonary congestion. Patient has responded + to  I.V. Lasix therapy producing today 757 ml. Net output -2841.  Creatinine holding steadily. Will continue I.V. Lasix, monitor kidney function and electrolytes. INR therapeutic and Free T4 1.38 while on replacement.   Signed, Pedro Earls, [employer]- NP  The patient was seen, examined and discussed with Saralyn Pilar, NP and Pedro Earls, [employer]- NP and agree as above.  53 year old male with acute on chronic systolic CHF, readmitted on 01/31/13 with cough and SOB, NYHA III, > 10 lbs of weight increase since the last discharge.  There has been -3 liters negative fluid balance since the admission with improvement of cough, the patient still gets SOB even with talking.  We wil continue iv Lasix, Crea mildly increased, we will monitor closely.  Tobias Alexander, Rexene Edison 02/02/2013

## 2013-02-02 NOTE — Care Management Note (Unsigned)
    Page 1 of 2   02/02/2013     1:39:25 PM   CARE MANAGEMENT NOTE 02/02/2013  Patient:  Raymond [name]   Account Number:  [id number]  Date Initiated:  02/02/2013  Documentation initiated by:  HUTCHINSON,CRYSTAL  Subjective/Objective Assessment:   Admitted with Systolic CHF     Action/Plan:   CM will monitor for disposition needs.   Anticipated DC Date:  02/05/2013   Anticipated DC Plan:  HOME W HOME HEALTH SERVICES      DC Planning Services  CM consult      [facility] Choice  HOME HEALTH   Choice offered to / List presented to:          [facility] arranged  HH-2 PT      Status of service:  Completed, signed off Medicare Important Message given?   (If response is "NO", the following Medicare IM given date fields will be blank) Date Medicare IM given:   Date Additional Medicare IM given:    Discharge Disposition:    Per UR Regulation:  Reviewed for med. necessity/level of care/duration of stay  If discussed at Long Length of Stay Meetings, dates discussed:    Comments:  02/02/2013 Last UR Date 02/01/2013 Social:  From home alone on SSD.  Patient has dyslexia and mother assist with [facility] mgmt needs and finances.  Hx/o patient had  CVA 2010  with left sided weakness.  Patient able to self manage ADLs. PCG/Mother/Raymond [name] [phone] cell or [phone] home. Patient'[name] physical address: [address] Ev[place] Cl[place] [address] [facility]) Med MGMT:  Patient admitts to difficulty with med mgmt and transportation issues d/t [name]/[name] of previously passing out and inability to drive for INRs PT Recs:  HH PT ([facility] notifried) RN:  Disease MGMT, Med MGMT ([facility] notified) DME:  No recs Sharline Lehane:  [facility] elected by patient ADD: 2-3+ Donato Schultz, RN, BSN, MSHL, CCM 02/02/2013

## 2013-02-03 LAB — BASIC METABOLIC PANEL
BUN: 27 mg/dL — ABNORMAL HIGH (ref 6–23)
CHLORIDE: 100 meq/L (ref 96–112)
CO2: 26 meq/L (ref 19–32)
CREATININE: 1.5 mg/dL — AB (ref 0.50–1.35)
Calcium: 8.9 mg/dL (ref 8.4–10.5)
GFR calc Af Amer: 61 mL/min — ABNORMAL LOW (ref >90)
GFR calc non Af Amer: 53 mL/min — ABNORMAL LOW (ref >90)
Glucose, Bld: 100 mg/dL — ABNORMAL HIGH (ref 70–99)
POTASSIUM: 3.7 meq/L (ref 3.7–5.3)
Sodium: 143 mEq/L (ref 137–147)

## 2013-02-03 LAB — PROTIME-INR
INR: 3.25 — ABNORMAL HIGH (ref 0.00–1.49)
PROTHROMBIN TIME: 32 s — AB (ref 11.6–15.2)

## 2013-02-03 NOTE — Progress Notes (Signed)
Pt a/o, no c/o pain, vss, pt ambulated in hallway did have some c/o SOB O2 sats 96% and above, pt stable

## 2013-02-03 NOTE — Progress Notes (Signed)
SATURATION QUALIFICATIONS: (This note is used to comply with regulatory documentation for home oxygen)  Patient Saturations on Room Air at Rest = 96%  Patient Saturations on Room Air while Ambulating = 96%  Patient Saturations on room air while Ambulating = 96%  Please briefly explain why patient needs home oxygen: pt ambulated in hallway, O2 96%, pt stable

## 2013-02-03 NOTE — Progress Notes (Signed)
Primary cardiologist: Dr. Rollene RotundaJames Hochrein  Subjective:    The patient at bedside chair. Feels somewhat better. Has not walked around very much yet. Cough has improved.  Objective:   Temp:  [97.7 F (36.5 C)-98.6 F (37 C)] 97.7 F (36.5 C) (01/17 0449) Pulse Rate:  [70-80] 80 (01/17 1016) Resp:  [18-20] 18 (01/17 0449) BP: (96-125)/(57-77) 123/76 mmHg (01/17 1016) SpO2:  [98 %-100 %] 100 % (01/17 0449) Weight:  [291 lb 14.2 oz (132.4 kg)] 291 lb 14.2 oz (132.4 kg) (01/17 0449) Last BM Date: 02/02/13  Filed Weights   02/01/13 0514 02/02/13 0533 02/03/13 0449  Weight: 296 lb 1.6 oz (134.31 kg) 296 lb 1.2 oz (134.3 kg) 291 lb 14.2 oz (132.4 kg)    Intake/Output Summary (Last 24 hours) at 02/03/13 1117 Last data filed at 02/03/13 1016  Gross per 24 hour  Intake    960 ml  Output   3952 ml  Net  -2992 ml    Telemetry: Paced rhythm.  Exam:  General: In no distress.  Lungs: Decreased breath sounds but clear, nonlabored.  Cardiac: RRR, indistinct PMI, no gallop.  Abdomen: Protuberant.  Extremities: Chronic appearing edema.   Lab Results:  Basic Metabolic Panel:  Recent Labs Lab 02/01/13 0547 02/02/13 0532 02/03/13 0430  NA 139 139 143  K 3.9 4.4 3.7  CL 99 100 100  CO2 26 24 26   GLUCOSE 116* 95 100*  BUN 18 22 27*  CREATININE 1.48* 1.58* 1.50*  CALCIUM 9.0 9.2 8.9    CBC:  Recent Labs Lab 01/31/13 1313  WBC 7.7  HGB 12.9*  HCT 40.7  MCV 87.9  PLT 253    BNP:  Recent Labs  09/01/12 1144 01/07/13 1354 01/31/13 1313  PROBNP 3262.0* 6367.0* 5170.0*    Coagulation:  Recent Labs Lab 02/02/13 0532 02/02/13 0920 02/03/13 0430  INR 3.39* 3.11* 3.25*    Imaging CHEST 2 VIEW  COMPARISON: 01/07/2013  FINDINGS: Left subclavian transvenous pacemaker/AICD leads project at right atrium, right ventricle and coronary sinus.  Enlargement of cardiac silhouette with pulmonary vascular congestion.  Peribronchial thickening.  No  acute infiltrate, pleural effusion or pneumothorax.  Bones unremarkable.  Lower lateral left costal margin partially excluded.  IMPRESSION: Enlargement of cardiac silhouette with pulmonary vascular congestion and AICD.  No acute abnormalities.    Medications:   Scheduled Medications: . acyclovir  400 mg Oral Daily  . coumadin book  1 each Does not apply Once  . dextromethorphan-guaiFENesin  1 tablet Oral BID  . digoxin  0.0625 mg Oral QHS  . furosemide  40 mg Intravenous BID  . levothyroxine  250 mcg Oral QAC breakfast  . losartan  25 mg Oral Daily  . metolazone  2.5 mg Oral Daily  . metoprolol  100 mg Oral BID  . potassium chloride SA  40 mEq Oral BID  . sodium chloride  3 mL Intravenous Q12H  . spironolactone  12.5 mg Oral Daily  . Warfarin - Pharmacist Dosing Inpatient   Does not apply q1800      PRN Medications:  sodium chloride, acetaminophen, guaiFENesin-dextromethorphan, ondansetron (ZOFRAN) IV, sodium chloride, zolpidem   Assessment:   1. Acute on chronic systolic heart failure, continues to diurese with stable renal function. Weight is down approximately 6 pounds so far.  2. Nonischemic cardiomyopathy, LVEF approximately 20%. Biventricular ICD in place.  3. Atrial fibrillation, on Coumadin and status post AV nodal ablation. INR has been therapeutic to supratherapeutic.  4. CKD, stage  3, creatinine currently 1.5.   Plan/Discussion:    Continue diuresis with IV Lasix and Zaroxolyn. He has put out an additional 3 L, total weight loss of 6 pounds. Renal dysfunction remains stable. Possible discharge in the next 24-48 hours.   Jonelle Sidle, M.D., F.A.C.C.

## 2013-02-03 NOTE — Progress Notes (Signed)
ANTICOAGULATION CONSULT NOTE - Initial Consult  Pharmacy Consult:  Coumadin Indication: atrial fibrillation  Allergies  Allergen Reactions  . Losartan Cough  . Valsartan Cough    Patient Measurements: Height: 5\' 11"  (180.3 cm) Weight: 291 lb 14.2 oz (132.4 kg) IBW/kg (Calculated) : 75.3  Vital Signs: Temp: 97.7 F (36.5 C) (01/17 0449) Temp src: Oral (01/17 0449) BP: 123/76 mmHg (01/17 1016) Pulse Rate: 80 (01/17 1016)  Labs:  Recent Labs  01/31/13 1313  01/31/13 1443 02/01/13 0547 02/02/13 0532 02/02/13 0920 02/03/13 0430  HGB 12.9*  --   --   --   --   --   --   HCT 40.7  --   --   --   --   --   --   PLT 253  --   --   --   --   --   --   APTT  --   --  38*  --   --   --   --   LABPROT  --   < > 23.5* 22.5* 33.0* 30.9* 32.0*  INR  --   < > 2.17* 2.05* 3.39* 3.11* 3.25*  CREATININE 1.45*  --   --  1.48* 1.58*  --  1.50*  < > = values in this interval not displayed.  Estimated Creatinine Clearance: 81.8 ml/min (by C-G formula based on Cr of 1.5).   Medical History: Past Medical History  Diagnosis Date  . Systolic CHF, chronic   . Non-ischemic cardiomyopathy     a. echo 11/10: EF 15%;   b. cath 10/08: normal cors;  c. Myoview 5/12: Very mild distal ant and apical isch, EF 17% (low risk-medical Rx cont'd);  d.  Echo 4/14:  EF 15%;  e. 05/2009 s/p SJM BiV ICD;  f. 07/2012 TEE: EF 20-25%, diff HK mild MR, mod TR.   Marland Kitchen Atrial fibrillation     a. on coumadin;  b. 07/2012 s/p TEE/DCCV. c. Recurrence 08/2012 in setting of abnormal thyroid panel.; 12/2012 AVN ablation by Dr [name]  . Hypertension   . Hypothyroidism     s/p RAI therapy  . Obesity   . Non-compliance   . Dyslexia   . 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 RBBB (right bundle branch block)   . Cough syncope     a. During 08/2012 adm.    Assessment: 52 YOM on Coumadin for history of Afib.  INR supra-therapeutic (3.39) on home  regimen of Coumadin 7.5mg  PO daily.  Patient reports being on this regimen for a couple of months and his INR has been stable.  He has been compliant with his med as well.  Repeat INR remains elevated at 3.25.  No bleeding reported.  Last h/h/plt stable.  Goal of Therapy:  INR 2-3 Monitor platelets by anticoagulation protocol: Yes    Plan:  - Hold Coumadin today given significant increase in INR - Daily PT / INR  Anabel Bene, PharmD Clinical Pharmacist Resident Pager: [phone]  02/03/2013, 10:41 AM

## 2013-02-04 LAB — CBC
HCT: 41.7 % (ref 39.0–52.0)
HEMOGLOBIN: 13.3 g/dL (ref 13.0–17.0)
MCH: 27.8 pg (ref 26.0–34.0)
MCHC: 31.9 g/dL (ref 30.0–36.0)
MCV: 87.2 fL (ref 78.0–100.0)
Platelets: 275 10*3/uL (ref 150–400)
RBC: 4.78 MIL/uL (ref 4.22–5.81)
RDW: 16.3 % — ABNORMAL HIGH (ref 11.5–15.5)
WBC: 8 10*3/uL (ref 4.0–10.5)

## 2013-02-04 LAB — BASIC METABOLIC PANEL
BUN: 27 mg/dL — ABNORMAL HIGH (ref 6–23)
CALCIUM: 8.9 mg/dL (ref 8.4–10.5)
CHLORIDE: 93 meq/L — AB (ref 96–112)
CO2: 31 meq/L (ref 19–32)
Creatinine, Ser: 1.65 mg/dL — ABNORMAL HIGH (ref 0.50–1.35)
GFR calc Af Amer: 54 mL/min — ABNORMAL LOW (ref >90)
GFR calc non Af Amer: 47 mL/min — ABNORMAL LOW (ref >90)
GLUCOSE: 115 mg/dL — AB (ref 70–99)
POTASSIUM: 3.3 meq/L — AB (ref 3.7–5.3)
SODIUM: 138 meq/L (ref 137–147)

## 2013-02-04 LAB — PROTIME-INR
INR: 2.44 — ABNORMAL HIGH (ref 0.00–1.49)
PROTHROMBIN TIME: 25.7 s — AB (ref 11.6–15.2)

## 2013-02-04 NOTE — Discharge Summary (Signed)
Please see my rounding note.

## 2013-02-04 NOTE — Progress Notes (Signed)
ANTICOAGULATION CONSULT NOTE - Initial Consult  Pharmacy Consult:  Coumadin Indication: atrial fibrillation  Allergies  Allergen Reactions  . Losartan Cough  . Valsartan Cough    Patient Measurements: Height: 5\' 11"  (180.3 cm) Weight: 287 lb 0.6 oz (130.2 kg) (Scale A) IBW/kg (Calculated) : 75.3  Vital Signs: Temp: 98.2 F (36.8 C) (01/18 0520) Temp src: Oral (01/18 0520) BP: 106/85 mmHg (01/18 1139) Pulse Rate: 75 (01/18 1139)  Labs:  Recent Labs  02/02/13 0532 02/02/13 0920 02/03/13 0430 02/04/13 0521 02/04/13 0857  HGB  --   --   --  13.3  --   HCT  --   --   --  41.7  --   PLT  --   --   --  275  --   LABPROT 33.0* 30.9* 32.0*  --  25.7*  INR 3.39* 3.11* 3.25*  --  2.44*  CREATININE 1.58*  --  1.50* 1.65*  --     Estimated Creatinine Clearance: 73.7 ml/min (by C-G formula based on Cr of 1.65).   Medical History: Past Medical History  Diagnosis Date  . Systolic CHF, chronic   . Non-ischemic cardiomyopathy     a. echo 11/10: EF 15%;   b. cath 10/08: normal cors;  c. Myoview 5/12: Very mild distal ant and apical isch, EF 17% (low risk-medical Rx cont'd);  d.  Echo 4/14:  EF 15%;  e. 05/2009 s/p SJM BiV ICD;  f. 07/2012 TEE: EF 20-25%, diff HK mild MR, mod TR.   Marland Kitchen Atrial fibrillation     a. on coumadin;  b. 07/2012 s/p TEE/DCCV. c. Recurrence 08/2012 in setting of abnormal thyroid panel.; 12/2012 AVN ablation by Dr [name]  . Hypertension   . Hypothyroidism     s/p RAI therapy  . Obesity   . Non-compliance   . Dyslexia   . 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 RBBB (right bundle branch block)   . Cough syncope     a. During 08/2012 adm.    Assessment: 52 YOM on Coumadin for history of Afib.  INR supra-therapeutic (3.39) on home regimen of Coumadin 7.5mg  PO daily.  Patient reports being on this regimen for a couple of months and his INR has been stable.  He has been compliant with his  med as well.  Pt has had high INRs and have held coumadin for 2 days.  Today's INR is 2.44 and therapeutic.  No bleeding reported and h/h/plt wnl.  Pt discharged today and continued on 7.5 mg per MD.  Sherron Monday with MD, and will get follow up with anticoagulation clinic tues/wed of next week at the lastest.  Goal of Therapy:  INR 2-3 Monitor platelets by anticoagulation protocol: Yes    Anabel Bene, PharmD Clinical Pharmacist Resident Pager: [phone]  02/04/2013, 1:00 PM
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
## 2013-02-04 NOTE — Progress Notes (Signed)
PT d/c home with girl friend. D/c instruction and medications reviewed with Pt. Pt states understanding . All questions answered

## 2013-02-04 NOTE — Progress Notes (Signed)
Primary cardiologist: Dr. [name]  Subjective:    Patient states he feels better today. I note that he did ambulate in the hall yesterday, no desaturations. He states that he would like to go home.  Objective:   Temp:  [97.6 F (36.4 C)-98.2 F (36.8 C)] 98.2 F (36.8 C) (01/18 0520) Pulse Rate:  [70] 70 (01/18 0520) Resp:  [18-20] 20 (01/18 0520) BP: (99-124)/(65-78) 99/65 mmHg (01/18 0520) SpO2:  [99 %-100 %] 100 % (01/18 0520) Weight:  [287 lb 0.6 oz (130.2 kg)] 287 lb 0.6 oz (130.2 kg) (01/18 0520) Last BM Date: 02/02/13  Filed Weights   02/02/13 0533 02/03/13 0449 02/04/13 0520  Weight: 296 lb 1.2 oz (134.3 kg) 291 lb 14.2 oz (132.4 kg) 287 lb 0.6 oz (130.2 kg)    Intake/Output Summary (Last 24 hours) at 02/04/13 1022 Last data filed at 02/04/13 0951  Gross per 24 hour  Intake   1800 ml  Output   3256 ml  Net  -1456 ml    Telemetry: Paced rhythm.  Exam:  General: In no distress.  Lungs: Decreased breath sounds but clear, nonlabored.  Cardiac: RRR, indistinct PMI, no gallop.  Abdomen: Protuberant.  Extremities: Chronic appearing edema.   Lab Results:  Basic Metabolic Panel:  Recent Labs Lab 02/02/13 0532 02/03/13 0430 02/04/13 0521  NA 139 143 138  K 4.4 3.7 3.3*  CL 100 100 93*  CO2 24 26 31   GLUCOSE 95 100* 115*  BUN 22 27* 27*  CREATININE 1.58* 1.50* 1.65*  CALCIUM 9.2 8.9 8.9    CBC:  Recent Labs Lab 01/31/13 1313 02/04/13 0521  WBC 7.7 8.0  HGB 12.9* 13.3  HCT 40.7 41.7  MCV 87.9 87.2  PLT 253 275    BNP:  Recent Labs  09/01/12 1144 01/07/13 1354 01/31/13 1313  PROBNP 3262.0* 6367.0* 5170.0*    Coagulation:  Recent Labs Lab 02/02/13 0532 02/02/13 0920 02/03/13 0430  INR 3.39* 3.11* 3.25*    Imaging CHEST 2 VIEW  COMPARISON: 01/07/2013  FINDINGS: Left subclavian transvenous pacemaker/AICD leads project at right atrium, right ventricle and coronary sinus.  Enlargement of cardiac silhouette  with pulmonary vascular congestion.  Peribronchial thickening.  No acute infiltrate, pleural effusion or pneumothorax.  Bones unremarkable.  Lower lateral left costal margin partially excluded.  IMPRESSION: Enlargement of cardiac silhouette with pulmonary vascular congestion and AICD.  No acute abnormalities.    Medications:   Scheduled Medications: . acyclovir  400 mg Oral Daily  . coumadin book  1 each Does not apply Once  . dextromethorphan-guaiFENesin  1 tablet Oral BID  . digoxin  0.0625 mg Oral QHS  . furosemide  40 mg Intravenous BID  . levothyroxine  250 mcg Oral QAC breakfast  . losartan  25 mg Oral Daily  . metolazone  2.5 mg Oral Daily  . metoprolol  100 mg Oral BID  . potassium chloride SA  40 mEq Oral BID  . sodium chloride  3 mL Intravenous Q12H  . spironolactone  12.5 mg Oral Daily  . Warfarin - Pharmacist Dosing Inpatient   Does not apply q1800     PRN Medications: sodium chloride, acetaminophen, guaiFENesin-dextromethorphan, ondansetron (ZOFRAN) IV, sodium chloride, zolpidem   Assessment:   1. Acute on chronic systolic heart failure, and has had a good diuresis, creatinine has increased from 1.5-1.6. Weight is down approximately 10 pounds.  2. Nonischemic cardiomyopathy, LVEF approximately 20%. Biventricular ICD in place.  3. Atrial fibrillation, on Coumadin  and status post AV nodal ablation. INR has been therapeutic to supratherapeutic.  4. CKD, stage 3, creatinine currently 1.6.   Plan/Discussion:    Discussed with patient, plan to discharge home today. He is in agreement, did not voice any concerns or other questions when asked. He has had a good diuresis with weight return to prior baseline. Would resume his prior outpatient regimen which from a diuretic perspective included Zaroxolyn 2.5 mg daily, Demadex 20 mg twice daily, Aldactone 12.5 mg daily, and potassium supplements at 40 mEq twice daily. No change in doses of Cozaar, Lanoxin, and  Lopressor. He will need to have a TOC visit with Dr. Antoine PocheHochrein or associate within one week. Should have a BMET for that visit.    Jonelle SidleSamuel G. Davit Vassar, M.D., F.A.C.C.

## 2013-02-04 NOTE — Discharge Summary (Signed)
Physician Discharge Summary  Patient ID: Raymond [name] MRN: [id number] DOB/AGE: [birth year] 53 y.o. Cardiologist:  Hochrein Admit date: 01/31/2013 Discharge date: 02/04/2013  Admission Diagnoses:  Acute on chronic systolic CHF (congestive heart failure)  Discharge Diagnoses:  Principal Problem:   Acute on chronic systolic CHF (congestive heart failure) Active Problems:   Morbid obesity   Biventricular implantable cardioverter-defibrillator in situ   Encounter for long-term (current) use of anticoagulants   Non-ischemic cardiomyopathy   CKD (chronic kidney disease), stage III   PAF (paroxysmal atrial fibrillation)   Discharged Condition: stable  Hospital Course:   The patient is a 53 y.o. Male, followed by Dr. Antoine Poche, with a NICM, EF 20-25%, s/p CRT-D implant, chronic systolic HF, atrial fibrillation, HTN, prior CVA and CKD. He was recently admitted, several weeks ago, on 01/07/13 with acute on chronic systolic HF in setting of atrial fibrillation and rapid ventricular response and loss of BiV pacing. He was diuresed. He was evaluated by Dr. [name]. Review of his ICD demonstrated rapid atrial fibrillation with reduction and BiV pacing. This is despite maximal medical therapy including amiodarone. AV node ablation was recommended for control of his refractory AF. On 01/09/2013, he underwent AV node ablation by Dr. [name].   He presented back to the [facility] ER with a complaint of DOE and constant cough. This has been occuring over the last several weeks but has progressively worsened. He can only walk a few feet before giving out. His cough is productive with clear colored sputum. He denies any sick contacts. No fever, chills, n/v. He notes associated 3 pillow orthopnea and LEE. No chest pain. No palpations. He admits to nonadherent to his diuretic x 2 days and dietary indiscretion.  The patient was admitted for IV diuretic therapy with zaroxolyn.  This resulted in net fluid loss of 7.7L.  Patient  had a none beat run of VT.  On 100mg  of lopressor BID.  The patient made a comment that he lasix is killing his kidneys and he will stop it when discharged.  He ambulated in the hall without desaturation.  Weight decreased from 296 to 287#.  The patient was seen by Dr. Diona Browner who felt he was stable for DC home.  Follow up arranged.   Consults: None  Significant Diagnostic Studies:  CHEST 2 VIEW  COMPARISON: 01/07/2013  FINDINGS: Left subclavian transvenous pacemaker/AICD leads project at right atrium, right ventricle and coronary sinus. Enlargement of cardiac silhouette with pulmonary vascular congestion. Peribronchial thickening. No acute infiltrate, pleural effusion or pneumothorax. Bones unremarkable. Lower lateral left costal margin partially excluded.  IMPRESSION: Enlargement of cardiac silhouette with pulmonary vascular congestion and AICD. No acute abnormalities.  Electronically Signed By: Ulyses Southward M.D.  Treatments: See above  Discharge Exam: Blood pressure 106/85, pulse 75, temperature 98.2 F (36.8 C), temperature source Oral, resp. rate 20, height 5\' 11"  (1.803 m), weight 287 lb 0.6 oz (130.2 kg), SpO2 100.00%.   Disposition: 06-Home-Health Care Svc      Discharge Orders   Future Appointments Provider Department Dept Phone   02/21/2013 11:45 AM [facility] [place] Office [phone]   03/05/2013 11:45 AM [name], MD [facility] [place] Office [phone]   04/12/2013 12:00 PM Marinus Maw, MD [facility] [phone]   Future Orders Complete By Expires   Diet - low sodium heart healthy  As directed    Discharge instructions  As directed    Comments:     Call  the office if you gain 2 pounds in 24hrs or 5 pounds in a week.   Increase activity slowly  As directed        Medication List         acyclovir 800 MG tablet  Commonly known as:  ZOVIRAX  Take 400 mg by mouth daily.      digoxin 0.125 MG tablet  Commonly known as:  LANOXIN  Take 0.0625 mg by mouth at bedtime.     levothyroxine 125 MCG tablet  Commonly known as:  SYNTHROID, LEVOTHROID  Take 250 mcg by mouth daily before breakfast.     losartan 50 MG tablet  Commonly known as:  COZAAR  Take 0.5 tablets (25 mg total) by mouth daily.     metolazone 2.5 MG tablet  Commonly known as:  ZAROXOLYN  Take 2.5 mg by mouth daily.     metoprolol 50 MG tablet  Commonly known as:  LOPRESSOR  Take 2 tablets (100 mg total) by mouth 2 (two) times daily.     potassium chloride SA 20 MEQ tablet  Commonly known as:  K-DUR,KLOR-CON  Take 2 tablets (40 mEq total) by mouth 2 (two) times daily.     spironolactone 12.5 mg Tabs tablet  Commonly known as:  ALDACTONE  Take 12.5 mg by mouth daily.     torsemide 20 MG tablet  Commonly known as:  DEMADEX  Take 1 tablet (20 mg total) by mouth 2 (two) times daily.     warfarin 7.5 MG tablet  Commonly known as:  COUMADIN  Take 7.5 mg by mouth every evening.       Follow-up Information   Follow up with Rollene RotundaJames Hochrein, MD. (The office will call you with the appt date and time.)    Specialty:  Cardiology   Contact information:   [address], [address] [place] [address] [phone]       Signed: Wilburt FinlayHAGER, Ymani Porcher 02/04/2013, 12:18 PM

## 2013-02-05 ENCOUNTER — Telehealth: Payer: Self-pay | Admitting: Cardiology

## 2013-02-05 NOTE — Telephone Encounter (Signed)
TCM call to patient he stated he felt good.Stated he understood his discharge instructions and is taking is medication as ordered.Advised to keep post hospital appointment with Tereso Newcomer PA 02/12/13 at 2:40 pm.

## 2013-02-05 NOTE — Progress Notes (Signed)
UR completed Retro.  Dharma Pare K. Lashala Laser, RN, BSN, MSHL, CCM  02/05/2013 8:30 AM

## 2013-02-05 NOTE — Telephone Encounter (Signed)
New message   patient was release from hospital on yesterday . Mom is checking on home health & physical therapy referral.

## 2013-02-05 NOTE — Telephone Encounter (Signed)
New message     TCM appt on 02-12-13 per after hours vm sheet

## 2013-02-05 NOTE — Telephone Encounter (Signed)
Spoke with mother who is aware the [facility] referral was started in the hospital and the pt will be contacted.  They have previously use [facility].

## 2013-02-08 ENCOUNTER — Emergency Department (HOSPITAL_COMMUNITY)
Admission: EM | Admit: 2013-02-08 | Discharge: 2013-02-08 | Disposition: A | Discharge status: Home / Self Care | Payer: Medicare Other | Attending: Emergency Medicine | Admitting: Emergency Medicine

## 2013-02-08 ENCOUNTER — Encounter (HOSPITAL_COMMUNITY): Payer: Self-pay | Admitting: Emergency Medicine

## 2013-02-08 ENCOUNTER — Telehealth: Payer: Self-pay | Admitting: Cardiology

## 2013-02-08 ENCOUNTER — Emergency Department (HOSPITAL_COMMUNITY): Payer: Medicare Other

## 2013-02-08 DIAGNOSIS — R059 Cough, unspecified: Secondary | ICD-10-CM

## 2013-02-08 DIAGNOSIS — R05 Cough: Secondary | ICD-10-CM

## 2013-02-08 DIAGNOSIS — Z8673 Personal history of transient ischemic attack (TIA), and cerebral infarction without residual deficits: Secondary | ICD-10-CM | POA: Insufficient documentation

## 2013-02-08 DIAGNOSIS — Z79899 Other long term (current) drug therapy: Secondary | ICD-10-CM | POA: Insufficient documentation

## 2013-02-08 DIAGNOSIS — I5022 Chronic systolic (congestive) heart failure: Secondary | ICD-10-CM | POA: Insufficient documentation

## 2013-02-08 DIAGNOSIS — Z95818 Presence of other cardiac implants and grafts: Secondary | ICD-10-CM | POA: Insufficient documentation

## 2013-02-08 DIAGNOSIS — R1110 Vomiting, unspecified: Secondary | ICD-10-CM

## 2013-02-08 DIAGNOSIS — E669 Obesity, unspecified: Secondary | ICD-10-CM | POA: Insufficient documentation

## 2013-02-08 DIAGNOSIS — Z7901 Long term (current) use of anticoagulants: Secondary | ICD-10-CM | POA: Insufficient documentation

## 2013-02-08 DIAGNOSIS — I10 Essential (primary) hypertension: Secondary | ICD-10-CM | POA: Insufficient documentation

## 2013-02-08 DIAGNOSIS — E039 Hypothyroidism, unspecified: Secondary | ICD-10-CM | POA: Insufficient documentation

## 2013-02-08 DIAGNOSIS — I4891 Unspecified atrial fibrillation: Secondary | ICD-10-CM | POA: Insufficient documentation

## 2013-02-08 LAB — COMPREHENSIVE METABOLIC PANEL
ALBUMIN: 3.8 g/dL (ref 3.5–5.2)
ALT: 87 U/L — AB (ref 0–53)
AST: 50 U/L — AB (ref 0–37)
Alkaline Phosphatase: 156 U/L — ABNORMAL HIGH (ref 39–117)
BILIRUBIN TOTAL: 0.5 mg/dL (ref 0.3–1.2)
BUN: 25 mg/dL — ABNORMAL HIGH (ref 6–23)
CHLORIDE: 96 meq/L (ref 96–112)
CO2: 26 meq/L (ref 19–32)
Calcium: 8.9 mg/dL (ref 8.4–10.5)
Creatinine, Ser: 1.47 mg/dL — ABNORMAL HIGH (ref 0.50–1.35)
GFR calc Af Amer: 63 mL/min — ABNORMAL LOW (ref >90)
GFR, EST NON AFRICAN AMERICAN: 54 mL/min — AB (ref >90)
Glucose, Bld: 114 mg/dL — ABNORMAL HIGH (ref 70–99)
POTASSIUM: 3.7 meq/L (ref 3.7–5.3)
SODIUM: 138 meq/L (ref 137–147)
Total Protein: 7.9 g/dL (ref 6.0–8.3)

## 2013-02-08 LAB — CBC WITH DIFFERENTIAL/PLATELET
BASOS PCT: 1 % (ref 0–1)
Basophils Absolute: 0.1 10*3/uL (ref 0.0–0.1)
Eosinophils Absolute: 0 10*3/uL (ref 0.0–0.7)
Eosinophils Relative: 0 % (ref 0–5)
HCT: 46.3 % (ref 39.0–52.0)
Hemoglobin: 15.3 g/dL (ref 13.0–17.0)
LYMPHS ABS: 0.8 10*3/uL (ref 0.7–4.0)
Lymphocytes Relative: 12 % (ref 12–46)
MCH: 28.1 pg (ref 26.0–34.0)
MCHC: 33 g/dL (ref 30.0–36.0)
MCV: 85 fL (ref 78.0–100.0)
MONOS PCT: 12 % (ref 3–12)
Monocytes Absolute: 0.8 10*3/uL (ref 0.1–1.0)
NEUTROS PCT: 75 % (ref 43–77)
Neutro Abs: 5 10*3/uL (ref 1.7–7.7)
PLATELETS: 220 10*3/uL (ref 150–400)
RBC: 5.45 MIL/uL (ref 4.22–5.81)
RDW: 16.2 % — ABNORMAL HIGH (ref 11.5–15.5)
WBC: 6.7 10*3/uL (ref 4.0–10.5)

## 2013-02-08 LAB — POCT I-STAT TROPONIN I: TROPONIN I, POC: 0.08 ng/mL (ref 0.00–0.08)

## 2013-02-08 LAB — LIPASE, BLOOD: LIPASE: 37 U/L (ref 11–59)

## 2013-02-08 MED ORDER — HYDROCOD POLST-CHLORPHEN POLST 10-8 MG/5ML PO LQCR: 5.0000 mL | Freq: Two times a day (BID) | ORAL | Status: DC | PRN

## 2013-02-08 MED ORDER — MORPHINE SULFATE 4 MG/ML IJ SOLN
4.0000 mg | Freq: Once | INTRAMUSCULAR | Status: AC
  Administered 2013-02-08: 4 mg via INTRAVENOUS
  Filled 2013-02-08: qty 1

## 2013-02-08 NOTE — ED Provider Notes (Signed)
Medical screening examination/treatment/procedure(s) were performed by non-physician practitioner and as supervising physician I was immediately available for consultation/collaboration.  Flint Melter, MD 02/08/13 2012

## 2013-02-08 NOTE — Discharge Instructions (Signed)
Cough, Adult  A cough is a reflex. It helps you clear your throat and airways. A cough can help heal your body. A cough can last 2 or 3 weeks (acute) or may last more than 8 weeks (chronic). Some common causes of a cough can include an infection, allergy, or a cold. HOME CARE  Only take medicine as told by your doctor.  If given, take your medicines (antibiotics) as told. Finish them even if you start to feel better.  Use a cold steam vaporizer or humidier in your home. This can help loosen thick spit (secretions).  Sleep so you are almost sitting up (semi-upright). Use pillows to do this. This helps reduce coughing.  Rest as needed.  Stop smoking if you smoke. GET HELP RIGHT AWAY IF:  You have yellowish-white fluid (pus) in your thick spit.  Your cough gets worse.  Your medicine does not reduce coughing, and you are losing sleep.  You cough up blood.  You have trouble breathing.  Your pain gets worse and medicine does not help.  You have a fever. MAKE SURE YOU:   Understand these instructions.  Will watch your condition.  Will get help right away if you are not doing well or get worse. Document Released: 09/17/2010 Document Revised: 03/29/2011 Document Reviewed: 09/17/2010 [employer] Patient Information 2014 [employer].  

## 2013-02-08 NOTE — ED Notes (Signed)
Pt now reports chest pain to registration; triage RN aware; pt being brought back now.

## 2013-02-08 NOTE — Telephone Encounter (Signed)
New message    Pt released from hosp Sunday---today projectile vomiting and headache.  pls advise

## 2013-02-08 NOTE — ED Provider Notes (Signed)
CSN: [id number]     Arrival date & time 02/08/13  1410 History   First MD Initiated Contact with Patient 02/08/13 1508     Chief Complaint  Patient presents with  . Emesis   (Consider location/radiation/quality/duration/timing/severity/associated sxs/prior Treatment) Patient is a 53 y.o. male presenting with vomiting. The history is provided by the patient. No language interpreter was used.  Emesis Severity:  Moderate Associated symptoms: no abdominal pain, no chills, no diarrhea and no sore throat   Associated symptoms comment:  The patient presents with multiple episodes of forceful vomiting associated with a persistent non-productive cough. His cough is exacerbated by inspiration. No chest pain or fever. He denies bloody emesis.   Past Medical History  Diagnosis Date  . Systolic CHF, chronic   . Non-ischemic cardiomyopathy     a. echo 11/10: EF 15%;   b. cath 10/08: normal cors;  c. Myoview 5/12: Very mild distal ant and apical isch, EF 17% (low risk-medical Rx cont'd);  d.  Echo 4/14:  EF 15%;  e. 05/2009 s/p SJM BiV ICD;  f. 07/2012 TEE: EF 20-25%, diff HK mild MR, mod TR.   Marland Kitchen Atrial fibrillation     a. on coumadin;  b. 07/2012 s/p TEE/DCCV. c. Recurrence 08/2012 in setting of abnormal thyroid panel.; 12/2012 AVN ablation by Dr [name]  . Hypertension   . Hypothyroidism     s/p RAI therapy  . Obesity   . Non-compliance   . Dyslexia   . 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 RBBB (right bundle branch block)   . Cough syncope     a. During 08/2012 adm.   Past Surgical History  Procedure Laterality Date  . Bi-ventricular implantable cardioverter defibrillator  (crt-d)  06/11/09    [employer] [id number] BIVENTRICULAR AICD SERIAL #[id number]  . Knee arthroscopy Left ~ 1995  . Cardiac catheterization      "several time" (09/01/2012)  . Tee without cardioversion N/A 06/23/2012    Procedure: TRANSESOPHAGEAL  ECHOCARDIOGRAM (TEE);  Surgeon: Vesta Mixer, MD;  Location: [facility] ENDOSCOPY;  Service: Cardiovascular;  Laterality: N/A;  TEE will be done at 0730   . Cardioversion N/A 07/27/2012    Procedure: CARDIOVERSION;  Surgeon: Vesta Mixer, MD;  Location: [facility] ENDOSCOPY;  Service: Cardiovascular;  Laterality: N/A;  . Tee without cardioversion N/A 08/10/2012    Procedure: TRANSESOPHAGEAL ECHOCARDIOGRAM (TEE);  Surgeon: Wendall Stade, MD;  Location: [facility] ENDOSCOPY;  Service: Cardiovascular;  Laterality: N/A;  . Cardioversion N/A 08/10/2012    Procedure: CARDIOVERSION;  Surgeon: Wendall Stade, MD;  Location: [facility] ENDOSCOPY;  Service: Cardiovascular;  Laterality: N/A;  . Sp pta add intra cran  11/2008    a. 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 Ablation  01/09/2013    AVN ablation by Dr [name]   Family History  Problem Relation Age of Onset  . Heart disease Father    History  Substance Use Topics  . Smoking status: Never Smoker   . Smokeless tobacco: Never Used  . Alcohol Use: No    Review of Systems  Constitutional: Negative for fever and chills.  HENT: Negative.  Negative for congestion and sore throat.   Respiratory: Positive for cough. Negative for shortness of breath.   Cardiovascular: Negative.  Negative for chest pain and leg swelling.  Gastrointestinal: Positive for vomiting. Negative for abdominal pain and  diarrhea.  Musculoskeletal: Negative.   Skin: Negative.   Neurological: Negative.     Allergies  Losartan and Valsartan  Home Medications   Current Outpatient Rx  Name  Route  Sig  Dispense  Refill  . acyclovir (ZOVIRAX) 800 MG tablet   Oral   Take 400 mg by mouth daily.         . digoxin (LANOXIN) 0.125 MG tablet   Oral   Take 0.0625 mg by mouth at bedtime.          Marland Kitchen levothyroxine (SYNTHROID, LEVOTHROID) 125 MCG tablet   Oral   Take 250 mcg by mouth daily before breakfast.         . losartan (COZAAR) 50 MG tablet   Oral   Take  0.5 tablets (25 mg total) by mouth daily.         . metolazone (ZAROXOLYN) 2.5 MG tablet   Oral   Take 2.5 mg by mouth daily.          . metoprolol (LOPRESSOR) 50 MG tablet   Oral   Take 2 tablets (100 mg total) by mouth 2 (two) times daily.         . potassium chloride SA (K-DUR,KLOR-CON) 20 MEQ tablet   Oral   Take 2 tablets (40 mEq total) by mouth 2 (two) times daily.   120 tablet   11   . spironolactone (ALDACTONE) 12.5 mg TABS tablet   Oral   Take 12.5 mg by mouth daily.         Marland Kitchen torsemide (DEMADEX) 20 MG tablet   Oral   Take 1 tablet (20 mg total) by mouth 2 (two) times daily.   60 tablet   11   . warfarin (COUMADIN) 7.5 MG tablet   Oral   Take 7.5 mg by mouth every evening.           BP 98/57  Pulse 70  Temp(Src) 99.7 F (37.6 C) (Oral)  Resp 17  Ht 5\' 9"  (1.753 m)  SpO2 96% Physical Exam  Constitutional: He is oriented to person, place, and time. He appears well-developed and well-nourished.  HENT:  Head: Normocephalic.  Neck: Normal range of motion. Neck supple.  Cardiovascular: Normal rate and regular rhythm.   Pulmonary/Chest: Effort normal and breath sounds normal.  Abdominal: Soft. Bowel sounds are normal. There is no tenderness. There is no rebound and no guarding.  Musculoskeletal: Normal range of motion.  Neurological: He is alert and oriented to person, place, and time.  Skin: Skin is warm and dry. No rash noted.  Psychiatric: He has a normal mood and affect.    ED Course  Procedures (including critical care time) Labs Review Labs Reviewed  COMPREHENSIVE METABOLIC PANEL - Abnormal; Notable for the following:    Glucose, Bld 114 (*)    BUN 25 (*)    Creatinine, Ser 1.47 (*)    AST 50 (*)    ALT 87 (*)    Alkaline Phosphatase 156 (*)    GFR calc non Af Amer 54 (*)    GFR calc Af Amer 63 (*)    All other components within normal limits  CBC WITH DIFFERENTIAL - Abnormal; Notable for the following:    RDW 16.2 (*)    All other  components within normal limits  LIPASE, BLOOD  PROTIME-INR  POCT I-STAT TROPONIN I   Results for orders placed during the hospital encounter of 02/08/13  COMPREHENSIVE METABOLIC PANEL  Result Value Range   Sodium 138  137 - 147 mEq/L   Potassium 3.7  3.7 - 5.3 mEq/L   Chloride 96  96 - 112 mEq/L   CO2 26  19 - 32 mEq/L   Glucose, Bld 114 (*) 70 - 99 mg/dL   BUN 25 (*) 6 - 23 mg/dL   Creatinine, Ser 8.11 (*) 0.50 - 1.35 mg/dL   Calcium 8.9  8.4 - 91.4 mg/dL   Total Protein 7.9  6.0 - 8.3 g/dL   Albumin 3.8  3.5 - 5.2 g/dL   AST 50 (*) 0 - 37 U/L   ALT 87 (*) 0 - 53 U/L   Alkaline Phosphatase 156 (*) 39 - 117 U/L   Total Bilirubin 0.5  0.3 - 1.2 mg/dL   GFR calc non Af Amer 54 (*) >90 mL/min   GFR calc Af Amer 63 (*) >90 mL/min  CBC WITH DIFFERENTIAL      Result Value Range   WBC 6.7  4.0 - 10.5 K/uL   RBC 5.45  4.22 - 5.81 MIL/uL   Hemoglobin 15.3  13.0 - 17.0 g/dL   HCT 78.2  95.6 - 21.3 %   MCV 85.0  78.0 - 100.0 fL   MCH 28.1  26.0 - 34.0 pg   MCHC 33.0  30.0 - 36.0 g/dL   RDW 08.6 (*) 57.8 - 46.9 %   Platelets 220  150 - 400 K/uL   Neutrophils Relative % 75  43 - 77 %   Neutro Abs 5.0  1.7 - 7.7 K/uL   Lymphocytes Relative 12  12 - 46 %   Lymphs Abs 0.8  0.7 - 4.0 K/uL   Monocytes Relative 12  3 - 12 %   Monocytes Absolute 0.8  0.1 - 1.0 K/uL   Eosinophils Relative 0  0 - 5 %   Eosinophils Absolute 0.0  0.0 - 0.7 K/uL   Basophils Relative 1  0 - 1 %   Basophils Absolute 0.1  0.0 - 0.1 K/uL  LIPASE, BLOOD      Result Value Range   Lipase 37  11 - 59 U/L  POCT I-STAT TROPONIN I      Result Value Range   Troponin i, poc 0.08  0.00 - 0.08 ng/mL   Comment 3            Dg Chest 2 View  01/31/2013   CLINICAL DATA:  Shortness of breath, cough, weakness  EXAM: CHEST  2 VIEW  COMPARISON:  01/07/2013  FINDINGS: Left subclavian transvenous pacemaker/AICD leads project at right atrium, right ventricle and coronary sinus.  Enlargement of cardiac silhouette with  pulmonary vascular congestion.  Peribronchial thickening.  No acute infiltrate, pleural effusion or pneumothorax.  Bones unremarkable.  Lower lateral left costal margin partially excluded.  IMPRESSION: Enlargement of cardiac silhouette with pulmonary vascular congestion and AICD.  No acute abnormalities.   Electronically Signed   By: Ulyses Southward M.D.   On: 01/31/2013 14:06   Dg Chest Portable 1 View  02/08/2013   CLINICAL DATA:  Emesis, cough  EXAM: PORTABLE CHEST - 1 VIEW  COMPARISON:  01/31/2013  FINDINGS: Left subclavian AICD/ pacer noted. Cardiomegaly evident with increased vascular and interstitial prominence, suspect early edema when compared to the prior study. No effusion or pneumonia. No collapse or consolidation. Negative for pneumothorax. Trachea midline.  IMPRESSION: Cardiomegaly with early interstitial edema.   Electronically Signed   By: Ruel Favors M.D.   On: 02/08/2013  17:20   Imaging Review Dg Chest Portable 1 View  02/08/2013   CLINICAL DATA:  Emesis, cough  EXAM: PORTABLE CHEST - 1 VIEW  COMPARISON:  01/31/2013  FINDINGS: Left subclavian AICD/ pacer noted. Cardiomegaly evident with increased vascular and interstitial prominence, suspect early edema when compared to the prior study. No effusion or pneumonia. No collapse or consolidation. Negative for pneumothorax. Trachea midline.  IMPRESSION: Cardiomegaly with early interstitial edema.   Electronically Signed   By: Ruel Favorsrevor  Shick M.D.   On: 02/08/2013 17:20    EKG Interpretation    Date/Time:  Thursday February 08 2013 14:52:23 EST Ventricular Rate:  70 PR Interval:    QRS Duration: 170 QT Interval:  524 QTC Calculation: 565 R Axis:   -115 Text Interpretation:  Ventricular-paced rhythm Abnormal ECG since last tracing no significant change Confirmed by WENTZ  MD, ELLIOTT ([id number]) on 02/08/2013 5:39:20 PM            MDM  No diagnosis found. 1. Cough 2. Post-tussive vomiting.   Cough is resolved with morphine. No  vomiting in ED. He continues to be pain free. Lab studies reassuring. Stable for discharge home.     Arnoldo HookerShari A Ladislao Cohenour, PA-C 02/08/13 1820

## 2013-02-08 NOTE — ED Notes (Signed)
Pt reports he has vomited 4 times this am and his head is hurting. Family states the pt has c/o intermittent chest pain for past 2 days. Pt is a&ox4, resp e/u

## 2013-02-08 NOTE — ED Notes (Signed)
Pt's girlfriend stated that pt's eyes looked red. Upon eye exam, pt's sclera appeared unchanged since arrival.

## 2013-02-08 NOTE — ED Notes (Signed)
Pt reports coughing, nausea, and headache since this past Sunday. Reports headache 8/10. PCP informed pt to come to the ER for further evaluation.

## 2013-02-08 NOTE — ED Notes (Signed)
Pt refused to let RN draw PT/INR blood sample.

## 2013-02-08 NOTE — Telephone Encounter (Signed)
Returned call to pt's mother. She is not with pt but states she received text from pt earlier today that he was projectile vomiting and has a headache. She reports pt is homebound. She requests I call pt at [phone]. I spoke with pt. He reports he projectile vomited 4 times today. Not vomiting at present but feels he will if he eats or drinks anything. He reports he has no increased shortness of breath but states it is hard for him to evaluate as he does not get up and move around. He does not know blood pressure. He reports headache that started earlier today. He is seen in Urgent Care for primary care.  I told him he should have symptoms evaluated. He reports his headache is 8/10 and the worst he has ever had.  He is asking if he should go to ED or Urgent Care.  With the severity of his headache I told him he should go to ED to be evaluated. He will contact his mother to transport him and will call EMS if needed.

## 2013-02-09 NOTE — Telephone Encounter (Signed)
Dr Hochrein aware 

## 2013-02-12 ENCOUNTER — Ambulatory Visit (INDEPENDENT_AMBULATORY_CARE_PROVIDER_SITE_OTHER): Payer: Medicare Other | Admitting: Pharmacist

## 2013-02-12 ENCOUNTER — Encounter: Payer: Self-pay | Admitting: Physician Assistant

## 2013-02-12 ENCOUNTER — Ambulatory Visit (INDEPENDENT_AMBULATORY_CARE_PROVIDER_SITE_OTHER): Payer: Medicare Other | Admitting: Physician Assistant

## 2013-02-12 ENCOUNTER — Encounter (INDEPENDENT_AMBULATORY_CARE_PROVIDER_SITE_OTHER): Payer: Self-pay

## 2013-02-12 VITALS — BP 140/86 | HR 70 | Ht 69.0 in | Wt 285.0 lb

## 2013-02-12 DIAGNOSIS — I4891 Unspecified atrial fibrillation: Secondary | ICD-10-CM

## 2013-02-12 DIAGNOSIS — I428 Other cardiomyopathies: Secondary | ICD-10-CM

## 2013-02-12 DIAGNOSIS — Z7901 Long term (current) use of anticoagulants: Secondary | ICD-10-CM

## 2013-02-12 DIAGNOSIS — Z95810 Presence of automatic (implantable) cardiac defibrillator: Secondary | ICD-10-CM

## 2013-02-12 DIAGNOSIS — R059 Cough, unspecified: Secondary | ICD-10-CM

## 2013-02-12 DIAGNOSIS — N189 Chronic kidney disease, unspecified: Secondary | ICD-10-CM

## 2013-02-12 DIAGNOSIS — R0602 Shortness of breath: Secondary | ICD-10-CM

## 2013-02-12 DIAGNOSIS — I5022 Chronic systolic (congestive) heart failure: Secondary | ICD-10-CM

## 2013-02-12 DIAGNOSIS — R05 Cough: Secondary | ICD-10-CM

## 2013-02-12 LAB — POCT INR: INR: 3.2

## 2013-02-12 MED ORDER — HYDROCOD POLST-CHLORPHEN POLST 10-8 MG/5ML PO LQCR: 5.0000 mL | Freq: Two times a day (BID) | ORAL | Status: DC | PRN

## 2013-02-12 MED ORDER — HYDROCOD POLST-CHLORPHEN POLST 10-8 MG/5ML PO LQCR
5.0000 mL | Freq: Two times a day (BID) | ORAL | Status: DC | PRN
Start: 2013-02-12 — End: 2013-02-26

## 2013-02-12 NOTE — Patient Instructions (Signed)
A REFILL FOR TUSSIONEX HAS BEEN SENT IN   LAB WORK TODAY; BMET, BNP  MAKE SURE TO KEEP YOUR FOLLOW UP WITH DR. HOCHREIN 03/05/13

## 2013-02-12 NOTE — Progress Notes (Signed)
[address], [address] [place], [place]  [address] Phone: [phone] Fax:  [phone]  Date:  02/12/2013   ID:  Raymond [name], DOB Apr 11, 1960, MRN [id number]  PCP:  No PCP Per Patient  Cardiologist:  Dr. [name]   Electrophysiologist:  Dr. Sherryl Manges    History of Present Illness: Raymond [name] is a 53 y.o. male with a hx of NICM, EF 15%, chronic systolic CHF, AFib and right brain stroke in 11/10 with residual left-sided weakness, status post CRT-D, CKD, hypothyroidism. LHC in 2008 with normal cors. Myoview 05/2010: Very mild distal anterior and apical ischemia, EF 17% (low risk-medical therapy continued). Echo 8/12: EF 20-25%. He had been on amiodarone. TSH had been noted to be very high and he was referred back to endocrinology. Synthroid was increased and he was kept on amiodarone. F/u Echo 4/14: EF 15%, diff HK, severe diast dysfn, restrictive physiology, E/medial e' > 15 suggests LVEDP at least 20 mmHg, borderline dilated Ao root, mild MR, mod LAE, mod reduced RVSF, mod TR, PASP 64.   He has had several admissions over the last 6 mos with recurrent atrial fibrillation with RVR and volume overload.  He has undergone TEE-DCCV but had return of AFib.  He returns for follow up today after 2 recent admissions to the hospital. He was admitted 12/21-12/24 with a/c systolic CHF in the setting of AFib with RVR and loss of BiV pacing.  AV nodal ablation was recommended for control of refractory atrial fibrillation. He underwent this procedure with Dr. [name] 01/09/13. Amiodarone was discontinued.  He was then readmitted 1/14-1/18 with a/c systolic CHF. He was diuresed with IV Lasix and was -7+ liters. Discharge weight was 287.  He did return to the emergency room with coughing and vomiting 02/09/12.    He continues to complain of a cough that is worse with lying flat. He sleeps on 3-4 pillows. He occasionally does have PND. He denies pedal edema or increased abdominal girth. He denies chest  pain. He does note that his dyspnea with exertion has improved. He is now NYHA class IIb. He denies syncope.  Recent Labs: 01/31/2013: Pro B Natriuretic peptide (BNP) 5170.0*; TSH 13.751*  02/08/2013: ALT 87*; Creatinine 1.47*; Hemoglobin 15.3; Potassium 3.7   Wt Readings from Last 3 Encounters:  02/12/13 285 lb (129.275 kg)  02/04/13 287 lb 0.6 oz (130.2 kg)  01/16/13 286 lb 6.4 oz (129.91 kg)     Past Medical History  Diagnosis Date  . Systolic CHF, chronic   . Non-ischemic cardiomyopathy     a. echo 11/10: EF 15%;   b. cath 10/08: normal cors;  c. Myoview 5/12: Very mild distal ant and apical isch, EF 17% (low risk-medical Rx cont'd);  d.  Echo 4/14:  EF 15%;  e. 05/2009 s/p SJM BiV ICD;  f. 07/2012 TEE: EF 20-25%, diff HK mild MR, mod TR.   Marland Kitchen Atrial fibrillation     a. on coumadin;  b. 07/2012 s/p TEE/DCCV. c. Recurrence 08/2012 in setting of abnormal thyroid panel.; 12/2012 AVN ablation by Dr [name]  . Hypertension   . Hypothyroidism     s/p RAI therapy  . Obesity   . Non-compliance   . Dyslexia   . 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 RBBB (right bundle branch block)   . Cough syncope     a. During 08/2012 adm.  Current Outpatient Prescriptions  Medication Sig Dispense Refill  . acyclovir (ZOVIRAX) 800 MG tablet Take 400 mg by mouth daily.      . chlorpheniramine-HYDROcodone (TUSSIONEX PENNKINETIC ER) 10-8 MG/5ML LQCR Take 5 mLs by mouth every 12 (twelve) hours as needed for cough.  80 mL  0  . digoxin (LANOXIN) 0.125 MG tablet Take 0.0625 mg by mouth at bedtime.       Marland Kitchen. levothyroxine (SYNTHROID, LEVOTHROID) 125 MCG tablet Take 250 mcg by mouth daily before breakfast.      . losartan (COZAAR) 50 MG tablet Take 0.5 tablets (25 mg total) by mouth daily.      . metolazone (ZAROXOLYN) 2.5 MG tablet Take 2.5 mg by mouth daily.       . metoprolol (LOPRESSOR) 50 MG tablet Take 2 tablets (100 mg total) by  mouth 2 (two) times daily.      . potassium chloride SA (K-DUR,KLOR-CON) 20 MEQ tablet Take 2 tablets (40 mEq total) by mouth 2 (two) times daily.  120 tablet  11  . spironolactone (ALDACTONE) 12.5 mg TABS tablet Take 12.5 mg by mouth daily.      Marland Kitchen. torsemide (DEMADEX) 20 MG tablet Take 1 tablet (20 mg total) by mouth 2 (two) times daily.  60 tablet  11  . warfarin (COUMADIN) 7.5 MG tablet Take 7.5 mg by mouth every evening.        No current facility-administered medications for this visit.    Allergies:   Losartan and Valsartan   Social History:  The patient  reports that he has never smoked. He has never used smokeless tobacco. He reports that he does not drink alcohol or use illicit drugs.   Family History:  The patient's family history includes Heart disease in his father.   ROS:  Please see the history of present illness.   He denies fevers or chills.   All other systems reviewed and negative.   PHYSICAL EXAM: VS:  BP 140/86  Pulse 70  Ht 5\' 9"  (1.753 m)  Wt 285 lb (129.275 kg)  BMI 42.07 kg/m2 Well nourished, well developed, in no acute distress HEENT: normal Neck: I cannot assess JVD Cardiac:  normal S1, S2; RRR; no murmur Lungs:  clear to auscultation bilaterally, no wheezing, rhonchi or rales Abd: soft, nontender, no hepatomegaly Ext: no edema Skin: warm and dry Neuro:  CNs 2-12 intact, no focal abnormalities noted  EKG:  V paced, HR 70     ASSESSMENT AND PLAN:  1. Chronic Systolic CHF: He overall appears to be well compensated. His weight has been stable. His breathing is improved. However, he continues to describe significant cough. I will check a follow up basic metabolic panel and BNP today. If his BNP remains elevated, I will adjust his diuretics further. He admits to compliance with all his medications. He seems to be adhering to a low salt diet and has limited his fluid intake. 2. Nonischemic Cardiomyopathy: Continue beta blocker, ARB, spironolactone,  digoxin. 3. Atrial Fibrillation: Continue Coumadin. He is now status post AV nodal ablation and is consistently biventricular pacing. 4. Status post CRT-D:  Continue follow up with EP as planned. 5. Chronic Kidney Disease: Check a follow up basic metabolic panel today. 6. Cough: Etiology not entirely clear. I suspect a lot of this cough as been related to volume overload and pulmonary edema. As noted, I will check a BNP. If his BNP seems to be at baseline, consider referral to pulmonology for further evaluation. I have  refilled his Tussionex x1. He will need further refills with primary care or pulmonology (if he is referred). 7. Disposition: Follow up with Dr. Antoine Poche in February as planned.   Signed, Tereso Newcomer, PA-C  02/12/2013 3:23 PM

## 2013-02-13 ENCOUNTER — Telehealth: Payer: Self-pay | Admitting: Cardiology

## 2013-02-13 LAB — BRAIN NATRIURETIC PEPTIDE: Pro B Natriuretic peptide (BNP): 438 pg/mL — ABNORMAL HIGH (ref 0.0–100.0)

## 2013-02-13 LAB — BASIC METABOLIC PANEL
BUN: 17 mg/dL (ref 6–23)
CALCIUM: 8.7 mg/dL (ref 8.4–10.5)
CO2: 34 mEq/L — ABNORMAL HIGH (ref 19–32)
Chloride: 97 mEq/L (ref 96–112)
Creatinine, Ser: 1.4 mg/dL (ref 0.4–1.5)
GFR: 55.11 mL/min — ABNORMAL LOW (ref >60.00)
Glucose, Bld: 111 mg/dL — ABNORMAL HIGH (ref 70–99)
Potassium: 3.5 mEq/L (ref 3.5–5.1)
Sodium: 137 mEq/L (ref 135–145)

## 2013-02-13 NOTE — Telephone Encounter (Signed)
Verbal order given for social worker to help with community resources.

## 2013-02-13 NOTE — Telephone Encounter (Signed)
New Message  Veanna with [facility] called. She requests to have the office of Dr. Antoine Poche put in an order so that a social worker can set the pt up with community resources.

## 2013-02-14 ENCOUNTER — Telehealth: Payer: Self-pay | Admitting: *Deleted

## 2013-02-14 DIAGNOSIS — I428 Other cardiomyopathies: Secondary | ICD-10-CM

## 2013-02-14 DIAGNOSIS — I5023 Acute on chronic systolic (congestive) heart failure: Secondary | ICD-10-CM

## 2013-02-14 NOTE — Telephone Encounter (Signed)
pt notified about lab results, no changes with meds. repeat bmet 2/2. I asked pt if cough getting better, pt states yes. Pt made aware we will hold off on referral to Pulmonary and for pt tcb if cough worse, then we will refer to Pulmonary, pt said ok.

## 2013-02-14 NOTE — Telephone Encounter (Signed)
lmptcb for lab results 

## 2013-02-19 ENCOUNTER — Other Ambulatory Visit (INDEPENDENT_AMBULATORY_CARE_PROVIDER_SITE_OTHER): Payer: Medicare Other

## 2013-02-19 ENCOUNTER — Telehealth: Payer: Self-pay | Admitting: *Deleted

## 2013-02-19 ENCOUNTER — Other Ambulatory Visit: Payer: Self-pay | Admitting: Cardiology

## 2013-02-19 DIAGNOSIS — I5023 Acute on chronic systolic (congestive) heart failure: Secondary | ICD-10-CM

## 2013-02-19 DIAGNOSIS — I428 Other cardiomyopathies: Secondary | ICD-10-CM

## 2013-02-19 LAB — BASIC METABOLIC PANEL
BUN: 15 mg/dL (ref 6–23)
CO2: 27 mEq/L (ref 19–32)
Calcium: 8.3 mg/dL — ABNORMAL LOW (ref 8.4–10.5)
Chloride: 103 mEq/L (ref 96–112)
Creatinine, Ser: 1.5 mg/dL (ref 0.4–1.5)
GFR: 53.81 mL/min — AB (ref >60.00)
Glucose, Bld: 103 mg/dL — ABNORMAL HIGH (ref 70–99)
Potassium: 3.3 mEq/L — ABNORMAL LOW (ref 3.5–5.1)
Sodium: 138 mEq/L (ref 135–145)

## 2013-02-19 MED ORDER — POTASSIUM CHLORIDE CRYS ER 20 MEQ PO TBCR
60.0000 meq | EXTENDED_RELEASE_TABLET | Freq: Two times a day (BID) | ORAL | Status: DC
Start: 2013-02-19 — End: 2013-02-26

## 2013-02-19 NOTE — Telephone Encounter (Signed)
pt notified about lab results and to increase K+ to 60 meq BID, will get bmet next week by [facility].[phone]

## 2013-02-19 NOTE — Telephone Encounter (Signed)
lmptcb for lab results; K+ 3.3

## 2013-02-19 NOTE — Telephone Encounter (Signed)
Follow up     Returning call back to nurse / cma

## 2013-02-20 ENCOUNTER — Telehealth: Payer: Self-pay | Admitting: Cardiology

## 2013-02-20 ENCOUNTER — Telehealth: Payer: Self-pay | Admitting: *Deleted

## 2013-02-20 ENCOUNTER — Ambulatory Visit (INDEPENDENT_AMBULATORY_CARE_PROVIDER_SITE_OTHER): Payer: Medicare Other | Admitting: Pharmacist

## 2013-02-20 DIAGNOSIS — Z7901 Long term (current) use of anticoagulants: Secondary | ICD-10-CM

## 2013-02-20 DIAGNOSIS — I4891 Unspecified atrial fibrillation: Secondary | ICD-10-CM

## 2013-02-20 LAB — PROTIME-INR: INR: 5 — AB (ref 0.9–1.1)

## 2013-02-20 LAB — POCT INR: INR: 6.4

## 2013-02-20 NOTE — Telephone Encounter (Signed)
New Message ° ° °[facility] [name] [facility] called states that the pt has an 8 lb wt gain in 5 days. Please call back to discuss.  ° °

## 2013-02-20 NOTE — Telephone Encounter (Signed)
Lmtcb//need vitals

## 2013-02-20 NOTE — Telephone Encounter (Signed)
New Message   Deana Wheat [facility] called states that the pt has an 8 lb wt gain in 5 days. Please call back to discuss.

## 2013-02-20 NOTE — Telephone Encounter (Signed)
Spoke with Deana RN, she spoke with one of our pharmacists and pts meds were adjusted already.

## 2013-02-20 NOTE — Telephone Encounter (Signed)
S/w HHRN Deanna Wheat [phone] about pt's 3.3 K+. Advised PA increased K+ to 60 meq bid, need repeat bmet 1 week which she will get on 02/26/13 when she re-checks INR for CVRR. RN will fax results to [place].

## 2013-02-21 NOTE — Telephone Encounter (Signed)
Called pt to verify lanoxin dose.  States he is taking 1/2 tablet of Lanoxin 0.125 mg. Need to reorder.

## 2013-02-22 ENCOUNTER — Ambulatory Visit (INDEPENDENT_AMBULATORY_CARE_PROVIDER_SITE_OTHER): Payer: Medicare Other | Admitting: Pharmacist

## 2013-02-22 ENCOUNTER — Telehealth: Payer: Self-pay | Admitting: Pharmacist

## 2013-02-22 DIAGNOSIS — Z7901 Long term (current) use of anticoagulants: Secondary | ICD-10-CM

## 2013-02-22 DIAGNOSIS — I4891 Unspecified atrial fibrillation: Secondary | ICD-10-CM

## 2013-02-22 LAB — POCT INR: INR: 5.2

## 2013-02-22 LAB — PROTIME-INR: INR: 3.9 — AB (ref <=1.1)

## 2013-02-22 NOTE — Telephone Encounter (Signed)
[facility] nurse [facility]) called to let us know patient's weight has gone up 2 more pounds over past 2 days.  She called earlier this week to let us know he gained 6 lbs over past week, but apparently was using Ibuprofen 800 mg daily the past 7 days.  Tereso Newcomer thus increased torsemide from 20 mg bid up to 40 mg AM / 20 mg PM for 3 days.  His last dose of the increased diuretic is today.  Patient is not c/o SOB according to nurse.  Social work is trying to get him Meals on Wheels to make sure he is getting appropriate meals sent to him.  His INR has been elevated, but we are managing this part.  He is scheduled to have a BNP and BMET in four days (Monday 02/26/13) by [facility].  Patient due to see you in clinic already in 10 days.  Wanted to keep you informed in case you wanted any further changes to be made.

## 2013-02-26 ENCOUNTER — Emergency Department (HOSPITAL_COMMUNITY): Payer: Medicare Other

## 2013-02-26 ENCOUNTER — Telehealth: Payer: Self-pay | Admitting: *Deleted

## 2013-02-26 ENCOUNTER — Ambulatory Visit (INDEPENDENT_AMBULATORY_CARE_PROVIDER_SITE_OTHER): Payer: Medicare Other | Admitting: Pharmacist

## 2013-02-26 ENCOUNTER — Inpatient Hospital Stay (HOSPITAL_COMMUNITY)
Admission: EM | Admit: 2013-02-26 | Discharge: 2013-03-01 | DRG: 291 | Disposition: A | Discharge status: Home Health | Payer: Medicare Other | Attending: Cardiology | Admitting: Cardiology

## 2013-02-26 ENCOUNTER — Encounter (HOSPITAL_COMMUNITY): Payer: Self-pay | Admitting: Emergency Medicine

## 2013-02-26 ENCOUNTER — Encounter: Payer: Self-pay | Admitting: Internal Medicine

## 2013-02-26 DIAGNOSIS — Z7901 Long term (current) use of anticoagulants: Secondary | ICD-10-CM

## 2013-02-26 DIAGNOSIS — I4891 Unspecified atrial fibrillation: Secondary | ICD-10-CM

## 2013-02-26 DIAGNOSIS — N183 Chronic kidney disease, stage 3 unspecified: Secondary | ICD-10-CM | POA: Diagnosis present

## 2013-02-26 DIAGNOSIS — I5023 Acute on chronic systolic (congestive) heart failure: Secondary | ICD-10-CM

## 2013-02-26 DIAGNOSIS — Z8673 Personal history of transient ischemic attack (TIA), and cerebral infarction without residual deficits: Secondary | ICD-10-CM

## 2013-02-26 DIAGNOSIS — I509 Heart failure, unspecified: Secondary | ICD-10-CM

## 2013-02-26 DIAGNOSIS — I119 Hypertensive heart disease without heart failure: Secondary | ICD-10-CM | POA: Diagnosis present

## 2013-02-26 DIAGNOSIS — I6930 Unspecified sequelae of cerebral infarction: Secondary | ICD-10-CM

## 2013-02-26 DIAGNOSIS — E039 Hypothyroidism, unspecified: Secondary | ICD-10-CM | POA: Diagnosis present

## 2013-02-26 DIAGNOSIS — N189 Chronic kidney disease, unspecified: Principal | ICD-10-CM

## 2013-02-26 DIAGNOSIS — I472 Ventricular tachycardia, unspecified: Secondary | ICD-10-CM | POA: Diagnosis present

## 2013-02-26 DIAGNOSIS — Z6841 Body Mass Index (BMI) 40.0 and over, adult: Secondary | ICD-10-CM

## 2013-02-26 DIAGNOSIS — I130 Hypertensive heart and chronic kidney disease with heart failure and stage 1 through stage 4 chronic kidney disease, or unspecified chronic kidney disease: Principal | ICD-10-CM | POA: Diagnosis present

## 2013-02-26 DIAGNOSIS — I6990 Unspecified sequelae of unspecified cerebrovascular disease: Secondary | ICD-10-CM

## 2013-02-26 DIAGNOSIS — I480 Paroxysmal atrial fibrillation: Secondary | ICD-10-CM | POA: Diagnosis present

## 2013-02-26 DIAGNOSIS — I4729 Other ventricular tachycardia: Secondary | ICD-10-CM | POA: Diagnosis not present

## 2013-02-26 DIAGNOSIS — Z23 Encounter for immunization: Secondary | ICD-10-CM

## 2013-02-26 DIAGNOSIS — Z95810 Presence of automatic (implantable) cardiac defibrillator: Secondary | ICD-10-CM | POA: Diagnosis present

## 2013-02-26 DIAGNOSIS — I428 Other cardiomyopathies: Secondary | ICD-10-CM | POA: Diagnosis present

## 2013-02-26 DIAGNOSIS — I5022 Chronic systolic (congestive) heart failure: Secondary | ICD-10-CM

## 2013-02-26 DIAGNOSIS — Z79899 Other long term (current) drug therapy: Secondary | ICD-10-CM

## 2013-02-26 LAB — BASIC METABOLIC PANEL
BUN: 17 mg/dL (ref 6–23)
CO2: 21 mEq/L (ref 19–32)
Calcium: 8.7 mg/dL (ref 8.4–10.5)
Chloride: 105 mEq/L (ref 96–112)
Creatinine, Ser: 1.34 mg/dL (ref 0.50–1.35)
GFR calc Af Amer: 70 mL/min — ABNORMAL LOW (ref >90)
GFR, EST NON AFRICAN AMERICAN: 60 mL/min — AB (ref >90)
GLUCOSE: 101 mg/dL — AB (ref 70–99)
POTASSIUM: 4.5 meq/L (ref 3.7–5.3)
Sodium: 141 mEq/L (ref 137–147)

## 2013-02-26 LAB — CBC
HEMATOCRIT: 39.8 % (ref 39.0–52.0)
Hemoglobin: 12.7 g/dL — ABNORMAL LOW (ref 13.0–17.0)
MCH: 27.4 pg (ref 26.0–34.0)
MCHC: 31.9 g/dL (ref 30.0–36.0)
MCV: 86 fL (ref 78.0–100.0)
Platelets: 304 10*3/uL (ref 150–400)
RBC: 4.63 MIL/uL (ref 4.22–5.81)
RDW: 17.6 % — ABNORMAL HIGH (ref 11.5–15.5)
WBC: 7.8 10*3/uL (ref 4.0–10.5)

## 2013-02-26 LAB — POCT I-STAT TROPONIN I: Troponin i, poc: 0.05 ng/mL (ref 0.00–0.08)

## 2013-02-26 LAB — POCT INR: INR: 1.7

## 2013-02-26 LAB — PRO B NATRIURETIC PEPTIDE: Pro B Natriuretic peptide (BNP): 3292 pg/mL — ABNORMAL HIGH (ref 0–125)

## 2013-02-26 MED ORDER — SODIUM CHLORIDE 0.9 % IV SOLN: 250.0000 mL | INTRAVENOUS | Status: DC | PRN

## 2013-02-26 MED ORDER — GUAIFENESIN-CODEINE 100-10 MG/5ML PO SOLN
10.0000 mL | ORAL | Status: DC | PRN
  Administered 2013-02-27 (×3): 10 mL via ORAL
  Filled 2013-02-26 (×4): qty 10

## 2013-02-26 MED ORDER — FUROSEMIDE 10 MG/ML IJ SOLN
40.0000 mg | Freq: Two times a day (BID) | INTRAMUSCULAR | Status: DC
  Administered 2013-02-27 – 2013-03-01 (×5): 40 mg via INTRAVENOUS
  Filled 2013-02-26 (×7): qty 4

## 2013-02-26 MED ORDER — LEVOTHYROXINE SODIUM 125 MCG PO TABS
250.0000 ug | ORAL_TABLET | Freq: Every day | ORAL | Status: DC
  Administered 2013-02-27 – 2013-03-01 (×3): 250 ug via ORAL
  Filled 2013-02-26 (×4): qty 2

## 2013-02-26 MED ORDER — METOPROLOL TARTRATE 100 MG PO TABS
100.0000 mg | ORAL_TABLET | Freq: Two times a day (BID) | ORAL | Status: DC
  Administered 2013-02-27 – 2013-02-28 (×4): 100 mg via ORAL
  Filled 2013-02-26 (×5): qty 1

## 2013-02-26 MED ORDER — ONDANSETRON HCL 4 MG/2ML IJ SOLN: 4.0000 mg | Freq: Four times a day (QID) | INTRAMUSCULAR | Status: DC | PRN

## 2013-02-26 MED ORDER — SODIUM CHLORIDE 0.9 % IJ SOLN
3.0000 mL | Freq: Two times a day (BID) | INTRAMUSCULAR | Status: DC
  Administered 2013-02-27 – 2013-03-01 (×6): 3 mL via INTRAVENOUS

## 2013-02-26 MED ORDER — SODIUM CHLORIDE 0.9 % IJ SOLN: 3.0000 mL | INTRAMUSCULAR | Status: DC | PRN

## 2013-02-26 MED ORDER — ACYCLOVIR 400 MG PO TABS
400.0000 mg | ORAL_TABLET | Freq: Every day | ORAL | Status: DC
  Administered 2013-02-27 – 2013-03-01 (×3): 400 mg via ORAL
  Filled 2013-02-26 (×3): qty 1

## 2013-02-26 MED ORDER — ACETAMINOPHEN 325 MG PO TABS
650.0000 mg | ORAL_TABLET | ORAL | Status: DC | PRN
  Administered 2013-02-27: 650 mg via ORAL
  Filled 2013-02-26: qty 2

## 2013-02-26 MED ORDER — SPIRONOLACTONE 12.5 MG HALF TABLET
12.5000 mg | ORAL_TABLET | Freq: Every day | ORAL | Status: DC
  Administered 2013-02-27 – 2013-03-01 (×3): 12.5 mg via ORAL
  Filled 2013-02-26 (×3): qty 1

## 2013-02-26 MED ORDER — WARFARIN SODIUM 7.5 MG PO TABS: ORAL_TABLET | ORAL | Status: DC

## 2013-02-26 MED ORDER — FUROSEMIDE 10 MG/ML IJ SOLN
40.0000 mg | Freq: Once | INTRAMUSCULAR | Status: AC
  Administered 2013-02-26: 40 mg via INTRAVENOUS
  Filled 2013-02-26: qty 4

## 2013-02-26 MED ORDER — WARFARIN SODIUM 7.5 MG PO TABS
7.5000 mg | ORAL_TABLET | Freq: Every day | ORAL | Status: DC
  Administered 2013-02-27 – 2013-02-28 (×3): 7.5 mg via ORAL
  Filled 2013-02-26 (×4): qty 1

## 2013-02-26 MED ORDER — WARFARIN - PHYSICIAN DOSING INPATIENT: Freq: Every day | Status: DC

## 2013-02-26 NOTE — ED Notes (Signed)
Cardiology, MD at bedside. 

## 2013-02-26 NOTE — ED Notes (Signed)
This Rn was unsuccessful in gaining IV access. Eme S, RN to attempt.

## 2013-02-26 NOTE — Telephone Encounter (Signed)
Reviewed with Dr Antoine Poche - pt to increase Zaroxolyn 5 mg for the next 2 days.  [facility] aware and will let the pt know.

## 2013-02-26 NOTE — Telephone Encounter (Signed)
[facility] called to report 3 lb weight gain since Thursday last week.  He has bilateral wheezing and a wet cough.  Ankle and abdominal girth are both down though.  Advised I will review with Dr Antoine Poche and call her back with orders.  She drew BNP and BMP today which she will have ran STAT.

## 2013-02-26 NOTE — ED Provider Notes (Signed)
CSN: [id number]     Arrival date & time 02/26/13  1927 History   First MD Initiated Contact with Patient 02/26/13 2019     Chief Complaint  Patient presents with  . Shortness of Breath  . Weakness     (Consider location/radiation/quality/duration/timing/severity/associated sxs/prior Treatment) Patient is a 53 y.o. male presenting with shortness of breath and weakness.  Shortness of Breath Associated symptoms: chest pain and cough   Weakness Associated symptoms include chest pain, coughing and weakness.   53 yo male with chronic hx of CHF presents with worsening DOE with associated wet cough and recent weight gain of about 10 pounds in 2 weeks. Patient states that he developed a cough about 5 days ago with worsening bilateral lower leg swelling. Patient then developed worsening shortness of breath with exertion over the past 2 days. Patient admits to orthopnea, and a brief episode of chest pain that lasted for a couple minutes on his way over to the hospital today. Denies any current chest pain. Patient denies fever/chills or recent sick contacts. Patient denies hx of smoking or drinking. Patient follows with Cardiology, [name], MD. Patient states he has been admitted to the Hospital 4 times for CHF exacerbations in the past couple months.  PMH significant for A. Fib, Cardiomyopathy, HTN, Hypothyroid, Central Obesity,Non-compliance, CVA, Cardiac cath in 2014, Cardiac ablation in 2014, and ventricular cardioverter defibrillator placement.  Past Medical History  Diagnosis Date  . Systolic CHF, chronic   . Non-ischemic cardiomyopathy     a. echo 11/10: EF 15%;   b. cath 10/08: normal cors;  c. Myoview 5/12: Very mild distal ant and apical isch, EF 17% (low risk-medical Rx cont'd);  d.  Echo 4/14:  EF 15%;  e. 05/2009 s/p SJM BiV ICD;  f. 07/2012 TEE: EF 20-25%, diff HK mild MR, mod TR.   Marland Kitchen Atrial fibrillation     a. on coumadin;  b. 07/2012 s/p TEE/DCCV. c. Recurrence 08/2012 in setting of  abnormal thyroid panel.; 12/2012 AVN ablation by Dr [name]  . Hypertension   . Hypothyroidism     s/p RAI therapy  . Obesity   . Non-compliance   . Dyslexia   . 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 RBBB (right bundle branch block)   . Cough syncope     a. During 08/2012 adm.   Past Surgical History  Procedure Laterality Date  . Bi-ventricular implantable cardioverter defibrillator  (crt-d)  06/11/09    [facility] UNIFY ON6295-28 BIVENTRICULAR AICD SERIAL #[id number]  . Knee arthroscopy Left ~ 1995  . Cardiac catheterization      "several time" (09/01/2012)  . Tee without cardioversion N/A 06/23/2012    Procedure: TRANSESOPHAGEAL ECHOCARDIOGRAM (TEE);  Surgeon: Vesta Mixer, MD;  Location: [facility] ENDOSCOPY;  Service: Cardiovascular;  Laterality: N/A;  TEE will be done at 0730   . Cardioversion N/A 07/27/2012    Procedure: CARDIOVERSION;  Surgeon: Vesta Mixer, MD;  Location: [facility] ENDOSCOPY;  Service: Cardiovascular;  Laterality: N/A;  . Tee without cardioversion N/A 08/10/2012    Procedure: TRANSESOPHAGEAL ECHOCARDIOGRAM (TEE);  Surgeon: Wendall Stade, MD;  Location: [facility] ENDOSCOPY;  Service: Cardiovascular;  Laterality: N/A;  . Cardioversion N/A 08/10/2012    Procedure: CARDIOVERSION;  Surgeon: Wendall Stade, MD;  Location: [facility] ENDOSCOPY;  Service: Cardiovascular;  Laterality: N/A;  . Sp pta add intra cran  11/2008    a.  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 Ablation  01/09/2013    AVN ablation by Dr Ladona Ridgelaylor   Family History  Problem Relation Age of Onset  . Heart disease Father   d History  Substance Use Topics  . Smoking status: Never Smoker   . Smokeless tobacco: Never Used  . Alcohol Use: No    Review of Systems  Constitutional: Positive for unexpected weight change.  Respiratory: Positive for cough and shortness of breath.   Cardiovascular: Positive for chest pain and  leg swelling.  Neurological: Positive for weakness.  All other systems reviewed and are negative.      Allergies  Losartan and Valsartan  Home Medications   No current outpatient prescriptions on file. BP 121/70  Pulse 71  Temp(Src) 97.4 F (36.3 C) (Oral)  Resp 18  Ht 5\' 9"  (1.753 m)  Wt 289 lb 0.4 oz (131.1 kg)  BMI 42.66 kg/m2  SpO2 99% Physical Exam  Nursing note and vitals reviewed. Constitutional: He is oriented to person, place, and time. He appears well-developed and well-nourished. No distress.  HENT:  Head: Normocephalic and atraumatic.  Mouth/Throat: Oropharynx is clear and moist. No oropharyngeal exudate.  Eyes: Conjunctivae and EOM are normal. No scleral icterus.  Neck: No JVD present.  Cardiovascular: Normal rate and regular rhythm.  Exam reveals no gallop and no friction rub.   No murmur heard. Pulmonary/Chest: Effort normal. Not tachypneic. No respiratory distress. He has no wheezes. He has rales in the right lower field and the left lower field.  Musculoskeletal: Normal range of motion. He exhibits no edema.  Lymphadenopathy:    He has no cervical adenopathy.  Neurological: He is alert and oriented to person, place, and time.  Skin: Skin is warm and dry. He is not diaphoretic.  Psychiatric: He has a normal mood and affect. His behavior is normal.    ED Course  Procedures (including critical care time) Labs Review Labs Reviewed  CBC - Abnormal; Notable for the following:    Hemoglobin 12.7 (*)    RDW 17.6 (*)    All other components within normal limits  BASIC METABOLIC PANEL - Abnormal; Notable for the following:    Glucose, Bld 101 (*)    GFR calc non Af Amer 60 (*)    GFR calc Af Amer 70 (*)    All other components within normal limits  PRO B NATRIURETIC PEPTIDE - Abnormal; Notable for the following:    Pro B Natriuretic peptide (BNP) 3292.0 (*)    All other components within normal limits  BASIC METABOLIC PANEL - Abnormal; Notable for the  following:    Glucose, Bld 119 (*)    Creatinine, Ser 1.42 (*)    GFR calc non Af Amer 56 (*)    GFR calc Af Amer 65 (*)    All other components within normal limits  PROTIME-INR - Abnormal; Notable for the following:    Prothrombin Time 18.5 (*)    INR 1.59 (*)    All other components within normal limits  DIGOXIN LEVEL - Abnormal; Notable for the following:    Digoxin Level 0.3 (*)    All other components within normal limits  MAGNESIUM  TROPONIN I  TROPONIN I  POCT I-STAT TROPONIN I   Imaging Review Dg Chest 2 View  02/26/2013   CLINICAL DATA:  Shortness of breath and weakness.  Cough for 1 week.  EXAM: CHEST  2 VIEW  COMPARISON:  Single  view of the chest 02/08/2013 and PA and lateral chest 01/07/2013.  FINDINGS: AICD again noted. There is marked cardiomegaly. Pulmonary vascular congestion without frank edema is noted. No pneumothorax or pleural effusion.  IMPRESSION: Cardiomegaly and pulmonary vascular congestion.  No acute finding.   Electronically Signed   By: Drusilla Kanner M.D.   On: 02/26/2013 21:00     MDM   Final diagnoses:  CHF (congestive heart failure)   EKG shows ventricular paced rhythm. Similar to previous tracing.  CXR shows Cardiomegaly and pulmonary vascular congestion.  Troponin negative. BNP 3293, significantly elevated from previous.   Patient discussed with Dr. Anitra Lauth. Plan to admit patient.          Rudene Anda, PA-C 02/27/13 [phone]

## 2013-02-26 NOTE — ED Notes (Signed)
Pt returned from radiology.

## 2013-02-26 NOTE — H&P (Signed)
Cardiology History and Physical  Cardiologist: Dr. Minus Breeding  Electrophysiologist: Dr. Virl Axe  No PCP Per Patient  History of Present Illness (and review of medical records): Raymond [name] is a 53 y.o. male who presents for evaluation of increased weight.  He has known hx of NICM, EF 05%, chronic systolic CHF, AFib and right brain stroke in 11/10 with residual left-sided weakness, status post CRT-D, CKD, hypothyroidism. LHC in 2008 with normal cors. Myoview 05/2010: Very mild distal anterior and apical ischemia, EF 17% (low risk-medical therapy continued). Echo 8/12: EF 20-25%. He had been on amiodarone. TSH had been noted to be very high and he was referred back to endocrinology. Synthroid was increased and he was kept on amiodarone. F/u Echo 4/14: EF 15%, diff HK, severe diast dysfn, restrictive physiology, E/medial e' > 15 suggests LVEDP at least 20 mmHg, borderline dilated Ao root, mild MR, mod LAE, mod reduced RVSF, mod TR, PASP 64.  He has had several admissions over the last 6 mos with recurrent atrial fibrillation with RVR and volume overload. He has undergone TEE-DCCV but had return of AFib.   He was last admitted 39/76-73/41 with a/c systolic CHF in the setting of AFib with RVR and loss of BiV pacing. AV nodal ablation was recommended for control of refractory atrial fibrillation. He underwent this procedure with Dr. Lovena Le 01/09/13. Amiodarone was discontinued. He was then readmitted 9/37-9/02 with a/c systolic CHF. He was diuresed with IV Lasix and was -7+ liters. Discharge weight was 287. He did return to the emergency room with coughing and vomiting 02/09/12.   He now returns to ED with concerns for increased weight.  He reports compliance with medications.  He has called in to report increasing weight over the past several days.  He has also had dyspnea on exertion worsening over past few days.  Otherwise denies PND or orthopnea.  He is up to 292 from 287 when last seen in  clinic.  He had no chest pain at home, but did have some on the way to ED.  He had his Torsemide recently increased to 30m in am and 20pm and Zaroxolyn was also added with no significant improvement.  Last BNP in clinic was 438, today 3952.  Review of Systems No fevers, chills, night sweats, Continues to have chronic cough. Further review of systems was otherwise negative other than stated in HPI.  Patient Active Problem List   Diagnosis Date Noted  . CHF (congestive heart failure) 02/26/2013  . PAF (paroxysmal atrial fibrillation) 02/02/2013  . Systolic CHF, acute on chronic 01/31/2013  . Acute on chronic clinical systolic heart failure 140/97/3532 . CKD (chronic kidney disease), stage III 08/09/2012  . Acute on chronic systolic CHF (congestive heart failure) 08/07/2012  . 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 Non-ischemic cardiomyopathy   . Chronic systolic heart failure   . Encounter for long-term (current) use of anticoagulants 04/15/2010  . Morbid obesity   . Biventricular implantable cardioverter-defibrillator in situ 06/26/2009  . Hypertensive heart disease   . Atrial fibrillation    Past Medical History  Diagnosis Date  . Systolic CHF, chronic   . Non-ischemic cardiomyopathy     a. echo 11/10: EF 15%;   b. cath 10/08: normal cors;  c. Myoview 5/12: Very mild distal ant and apical isch, EF 17% (low risk-medical Rx cont'd);  d.  Echo 4/14:  EF 15%;  e. 05/2009 s/p SJM BiV ICD;  f. 07/2012 TEE: EF 20-25%, diff HK mild MR, mod TR.   Marland Kitchen Atrial fibrillation     a. on coumadin;  b. 07/2012 s/p TEE/DCCV. c. Recurrence 08/2012 in setting of abnormal thyroid panel.; 12/2012 AVN ablation by Dr Lovena Le  . Hypertension   . Hypothyroidism     s/p RAI therapy  . Obesity   . Non-compliance   . Dyslexia   . 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 RBBB  (right bundle branch block)   . Cough syncope     a. During 08/2012 adm.    Past Surgical History  Procedure Laterality Date  . Bi-ventricular implantable cardioverter defibrillator  (crt-d)  06/11/09    [place] UNIFY [id number] BIVENTRICULAR AICD SERIAL #[id number]  . Knee arthroscopy Left ~ 1995  . Cardiac catheterization      "several time" (09/01/2012)  . Tee without cardioversion N/A 06/23/2012    Procedure: TRANSESOPHAGEAL ECHOCARDIOGRAM (TEE);  Surgeon: Thayer Headings, MD;  Location: [facility] ENDOSCOPY;  Service: Cardiovascular;  Laterality: N/A;  TEE will be done at 0730   . Cardioversion N/A 07/27/2012    Procedure: CARDIOVERSION;  Surgeon: Thayer Headings, MD;  Location: [facility] ENDOSCOPY;  Service: Cardiovascular;  Laterality: N/A;  . Tee without cardioversion N/A 08/10/2012    Procedure: TRANSESOPHAGEAL ECHOCARDIOGRAM (TEE);  Surgeon: Josue Hector, MD;  Location: [facility] ENDOSCOPY;  Service: Cardiovascular;  Laterality: N/A;  . Cardioversion N/A 08/10/2012    Procedure: CARDIOVERSION;  Surgeon: Josue Hector, MD;  Location: [facility] ENDOSCOPY;  Service: Cardiovascular;  Laterality: N/A;  . Sp pta add intra cran  11/2008    a. 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 Ablation  01/09/2013    AVN ablation by Dr Lovena Le    Prescriptions prior to admission  Medication Sig Dispense Refill  . acyclovir (ZOVIRAX) 800 MG tablet Take 400 mg by mouth daily.      . digoxin (LANOXIN) 0.125 MG tablet Take 0.0625 mg by mouth at bedtime.       Marland Kitchen levothyroxine (SYNTHROID, LEVOTHROID) 125 MCG tablet Take 250 mcg by mouth daily before breakfast.      . losartan (COZAAR) 50 MG tablet Take 0.5 tablets (25 mg total) by mouth daily.      . metolazone (ZAROXOLYN) 2.5 MG tablet Take 2.5 mg by mouth daily.       . metoprolol (LOPRESSOR) 50 MG tablet Take 2 tablets (100 mg total) by mouth 2 (two) times daily.      . potassium chloride SA (KLOR-CON M20) 20 MEQ tablet Take 60 mEq by mouth at  bedtime.      Marland Kitchen spironolactone (ALDACTONE) 12.5 mg TABS tablet Take 12.5 mg by mouth daily.      Marland Kitchen torsemide (DEMADEX) 20 MG tablet Take 1 tablet (20 mg total) by mouth 2 (two) times daily.  60 tablet  11  . warfarin (COUMADIN) 7.5 MG tablet Take 7.5 mg by mouth daily at 6 PM.       Allergies  Allergen Reactions  . Losartan Cough  . Valsartan Cough    History  Substance Use Topics  . Smoking status: Never Smoker   . Smokeless tobacco: Never Used  . Alcohol Use: No    Family History  Problem Relation Age of Onset  . Heart disease Father      Objective:  Patient Vitals for the past  8 hrs:  BP Temp Temp src Pulse Resp SpO2 Height Weight  02/27/13 0207 103/69 mmHg 97.9 F (36.6 C) Oral 69 21 95 % - -  02/26/13 2351 130/90 mmHg - - - - - - -  02/26/13 2341 - 98.2 F (36.8 C) Oral 70 22 98 % - -  02/26/13 2300 - - - - - - _0  (1.753 m) 132.586 kg (292 lb 4.8 oz)  02/26/13 2240 118/75 mmHg - - - 18 100 % - -  02/26/13 2130 127/76 mmHg - - 70 16 98 % - -   General appearance: alert, cooperative, appears stated age and no distress, obese male Head: Normocephalic, without obvious abnormality, atraumatic Eyes: conjunctivae/corneas clear. PERRL, EOM's intact. Fundi benign. Neck: JVP to ear lobe, supple Lungs: clear to auscultation bilaterally Chest wall: no tenderness Heart: regular rate and rhythm, S1, S2 normal, Abdomen: soft, non-tender; bowel sounds normal; no masses,  no organomegaly Extremities: extremities normal, atraumatic, BLE edema Pulses: 2+ and symmetric Neurologic: Grossly normal  Results for orders placed during the hospital encounter of 02/26/13 (from the past 48 hour(s))  CBC     Status: Abnormal   Collection Time    02/26/13  7:36 PM      Result Value Range   WBC 7.8  4.0 - 10.5 K/uL   RBC 4.63  4.22 - 5.81 MIL/uL   Hemoglobin 12.7 (*) 13.0 - 17.0 g/dL   HCT 39.8  39.0 - 52.0 %   MCV 86.0  78.0 - 100.0 fL   MCH 27.4  26.0 - 34.0 pg   MCHC 31.9  30.0 -  36.0 g/dL   RDW 17.6 (*) 11.5 - 15.5 %   Platelets 304  150 - 400 K/uL  BASIC METABOLIC PANEL     Status: Abnormal   Collection Time    02/26/13  7:36 PM      Result Value Range   Sodium 141  137 - 147 mEq/L   Potassium 4.5  3.7 - 5.3 mEq/L   Chloride 105  96 - 112 mEq/L   CO2 21  19 - 32 mEq/L   Glucose, Bld 101 (*) 70 - 99 mg/dL   BUN 17  6 - 23 mg/dL   Creatinine, Ser 1.34  0.50 - 1.35 mg/dL   Calcium 8.7  8.4 - 10.5 mg/dL   GFR calc non Af Amer 60 (*) >90 mL/min   GFR calc Af Amer 70 (*) >90 mL/min   Comment: (NOTE)     The eGFR has been calculated using the CKD EPI equation.     This calculation has not been validated in all clinical situations.     eGFR's persistently <90 mL/min signify possible Chronic Kidney     Disease.  PRO B NATRIURETIC PEPTIDE     Status: Abnormal   Collection Time    02/26/13  7:36 PM      Result Value Range   Pro B Natriuretic peptide (BNP) 3292.0 (*) 0 - 125 pg/mL  POCT I-STAT TROPONIN I     Status: None   Collection Time    02/26/13  8:19 PM      Result Value Range   Troponin i, poc 0.05  0.00 - 0.08 ng/mL   Comment 3            Comment: Due to the release kinetics of cTnI,     a negative result within the first hours     of the onset of symptoms  does not rule out     myocardial infarction with certainty.     If myocardial infarction is still suspected,     repeat the test at appropriate intervals.  BASIC METABOLIC PANEL     Status: Abnormal   Collection Time    02/27/13  3:00 AM      Result Value Range   Sodium 143  137 - 147 mEq/L   Potassium 4.2  3.7 - 5.3 mEq/L   Chloride 103  96 - 112 mEq/L   CO2 24  19 - 32 mEq/L   Glucose, Bld 119 (*) 70 - 99 mg/dL   BUN 16  6 - 23 mg/dL   Creatinine, Ser 1.42 (*) 0.50 - 1.35 mg/dL   Calcium 8.6  8.4 - 10.5 mg/dL   GFR calc non Af Amer 56 (*) >90 mL/min   GFR calc Af Amer 65 (*) >90 mL/min   Comment: (NOTE)     The eGFR has been calculated using the CKD EPI equation.     This calculation  has not been validated in all clinical situations.     eGFR's persistently <90 mL/min signify possible Chronic Kidney     Disease.  PROTIME-INR     Status: Abnormal   Collection Time    02/27/13  3:00 AM      Result Value Range   Prothrombin Time 18.5 (*) 11.6 - 15.2 seconds   INR 1.59 (*) 0.00 - 1.49  MAGNESIUM     Status: None   Collection Time    02/27/13  3:00 AM      Result Value Range   Magnesium 2.0  1.5 - 2.5 mg/dL  DIGOXIN LEVEL     Status: Abnormal   Collection Time    02/27/13  3:05 AM      Result Value Range   Digoxin Level 0.3 (*) 0.8 - 2.0 ng/mL  TROPONIN I     Status: None   Collection Time    02/27/13  3:05 AM      Result Value Range   Troponin I <0.30  <0.30 ng/mL   Comment:            Due to the release kinetics of cTnI,     a negative result within the first hours     of the onset of symptoms does not rule out     myocardial infarction with certainty.     If myocardial infarction is still suspected,     repeat the test at appropriate intervals.   Dg Chest 2 View  02/26/2013   CLINICAL DATA:  Shortness of breath and weakness.  Cough for 1 week.  EXAM: CHEST  2 VIEW  COMPARISON:  Single view of the chest 02/08/2013 and PA and lateral chest 01/07/2013.  FINDINGS: AICD again noted. There is marked cardiomegaly. Pulmonary vascular congestion without frank edema is noted. No pneumothorax or pleural effusion.  IMPRESSION: Cardiomegaly and pulmonary vascular congestion.  No acute finding.   Electronically Signed   By: Inge Rise M.D.   On: 02/26/2013 21:00    ECG:  Ventricular paced rhythm with occasional PVCs, 70 BPM  Assessment/ Plan:  1. Acute on Chronic Systolic CHF:   -Admit for IV diuresis, responded well to IV 40 in ED, Continue Lasix 66m BID  - Strict I/Os, daily weights, 1.5 liter fluid restriction.  2. Nonischemic Cardiomyopathy: Continue beta blocker, ARB, spironolactone, digoxin.  3. Atrial Fibrillation s/p AV nodal ablation: Continue  Coumadin.  4. Status post CRT-D

## 2013-02-26 NOTE — ED Notes (Signed)
Pt reported chest pain on his way to the hospital that lasted "only a few minutes".

## 2013-02-26 NOTE — ED Notes (Addendum)
Pt states he has been having SOB and weakness since yesterday.  Per his home health care nurse the pt has gained 4 lbs in three days.  Pt has had previous stroke 4 years prior and has limited use of his left arm.  Pt has pacemaker and stated is [employer]

## 2013-02-27 ENCOUNTER — Other Ambulatory Visit: Payer: Self-pay

## 2013-02-27 DIAGNOSIS — Z95810 Presence of automatic (implantable) cardiac defibrillator: Secondary | ICD-10-CM

## 2013-02-27 DIAGNOSIS — I428 Other cardiomyopathies: Secondary | ICD-10-CM

## 2013-02-27 DIAGNOSIS — I509 Heart failure, unspecified: Secondary | ICD-10-CM

## 2013-02-27 DIAGNOSIS — I4891 Unspecified atrial fibrillation: Secondary | ICD-10-CM

## 2013-02-27 DIAGNOSIS — I119 Hypertensive heart disease without heart failure: Secondary | ICD-10-CM

## 2013-02-27 DIAGNOSIS — I5023 Acute on chronic systolic (congestive) heart failure: Secondary | ICD-10-CM

## 2013-02-27 DIAGNOSIS — I5022 Chronic systolic (congestive) heart failure: Secondary | ICD-10-CM

## 2013-02-27 DIAGNOSIS — Z7901 Long term (current) use of anticoagulants: Secondary | ICD-10-CM

## 2013-02-27 DIAGNOSIS — E039 Hypothyroidism, unspecified: Secondary | ICD-10-CM | POA: Diagnosis present

## 2013-02-27 LAB — BASIC METABOLIC PANEL
BUN: 16 mg/dL (ref 6–23)
CHLORIDE: 103 meq/L (ref 96–112)
CO2: 24 meq/L (ref 19–32)
Calcium: 8.6 mg/dL (ref 8.4–10.5)
Creatinine, Ser: 1.42 mg/dL — ABNORMAL HIGH (ref 0.50–1.35)
GFR calc Af Amer: 65 mL/min — ABNORMAL LOW (ref >90)
GFR calc non Af Amer: 56 mL/min — ABNORMAL LOW (ref >90)
Glucose, Bld: 119 mg/dL — ABNORMAL HIGH (ref 70–99)
Potassium: 4.2 mEq/L (ref 3.7–5.3)
Sodium: 143 mEq/L (ref 137–147)

## 2013-02-27 LAB — DIGOXIN LEVEL: Digoxin Level: 0.3 ng/mL — ABNORMAL LOW (ref 0.8–2.0)

## 2013-02-27 LAB — TROPONIN I: Troponin I: <0.3 ng/mL (ref <0.30)

## 2013-02-27 LAB — MAGNESIUM: MAGNESIUM: 2 mg/dL (ref 1.5–2.5)

## 2013-02-27 LAB — PROTIME-INR
INR: 1.59 — ABNORMAL HIGH (ref 0.00–1.49)
PROTHROMBIN TIME: 18.5 s — AB (ref 11.6–15.2)

## 2013-02-27 MED ORDER — BIOTENE DRY MOUTH MT LIQD
15.0000 mL | Freq: Two times a day (BID) | OROMUCOSAL | Status: DC
  Administered 2013-02-27 – 2013-02-28 (×4): 15 mL via OROMUCOSAL

## 2013-02-27 MED ORDER — PNEUMOCOCCAL VAC POLYVALENT 25 MCG/0.5ML IJ INJ
0.5000 mL | INJECTION | INTRAMUSCULAR | Status: AC
  Administered 2013-02-28: 0.5 mL via INTRAMUSCULAR
  Filled 2013-02-27: qty 0.5

## 2013-02-27 MED ORDER — LOSARTAN POTASSIUM 25 MG PO TABS
25.0000 mg | ORAL_TABLET | Freq: Every day | ORAL | Status: DC
  Administered 2013-02-27 – 2013-03-01 (×3): 25 mg via ORAL
  Filled 2013-02-27 (×3): qty 1

## 2013-02-27 MED ORDER — CHLORHEXIDINE GLUCONATE 0.12 % MT SOLN
15.0000 mL | Freq: Two times a day (BID) | OROMUCOSAL | Status: DC
  Administered 2013-02-27 – 2013-03-01 (×3): 15 mL via OROMUCOSAL
  Filled 2013-02-27 (×7): qty 15

## 2013-02-27 NOTE — Progress Notes (Signed)
Pt requesting fluids frequently.  Pt has been educated on importance of fluid restriction.  Pt states that he breathes water in through the air and requests sprite.  Will continue to educate patient.

## 2013-02-27 NOTE — Plan of Care (Signed)
Problem: Phase I Progression Outcomes Goal: Up in chair, BRP Outcome: Progressing Pt can ambulate to use bathroom with 1 assist

## 2013-02-27 NOTE — Care Management Note (Signed)
    Page 1 of 2   02/27/2013     3:34:49 PM   CARE MANAGEMENT NOTE 02/27/2013  Patient:  Raymond [name],Raymond [name]   Account Number:  [id number]  Date Initiated:  02/27/2013  Documentation initiated by:  [facility]  Subjective/Objective Assessment:   53 yo male admited with aacute on chronic CHF//Home alone     Action/Plan:   Admit for IV diuresis, - Strict I/Os, daily weights, 1.5 liter fluid restriction//resumption of HH   Anticipated DC Date:  03/02/2013   Anticipated DC Plan:  HOME W HOME HEALTH SERVICES      DC Planning Services  CM consult      [facility] Choice  HOME HEALTH  Resumption Of Svcs/PTA Provider   Choice offered to / List presented to:          [facility] arranged  HH-1 RN  HH-10 DISEASE MANAGEMENT  HH-3 OT  HH-6 SOCIAL WORKER      HH agency  [facility]   Status of service:  In process, will continue to follow Medicare Important Message given?  YES (If response is "NO", the following Medicare IM given date fields will be blank) Date Medicare IM given:  02/26/2013 Date Additional Medicare IM given:    Discharge Disposition:    Per UR Regulation:  Reviewed for med. necessity/level of care/duration of stay  If discussed at Long Length of Stay Meetings, dates discussed:    Comments:  02/27/13 1500 Oletta Cohn, RN, BSN, [place] [phone] Pt currently active with [facility] for RN/OT/SW services.  Resumption of care requested.  Kizzie Furnish, RN of [facility] notified.  No DME needs identified at this time.

## 2013-02-27 NOTE — Progress Notes (Signed)
I cosign with Laura B Caldwell's assessments, medication administration, notes, intake and output, etc for this shift. Ommie Degeorge A, RN  

## 2013-02-27 NOTE — Progress Notes (Signed)
Pt refused bedside swallow evaluation, because he didn't want to be NPO and pt says he is not aspirating.  PA notified, will continue to monitor.

## 2013-02-27 NOTE — Progress Notes (Signed)
Pharmacy Re:  Losartan  Losartan 25mg  daily has been ordered for this patient.  He has stated intolerances to both Losartan and Valsartan with cough.  Consider discontinuing if cough continues to worsen in this patient.  Nadara Mustard, PharmD., MS Clinical Pharmacist Pager:  [phone] Thank you for allowing pharmacy to be part of this patients care team.

## 2013-02-27 NOTE — Progress Notes (Addendum)
Subjective:  He complains of a cough, worse when he eats, still a little SOB.   Objective:  Vital Signs in the last 24 hours: Temp:  [97.5 F (36.4 C)-98.5 F (36.9 C)] 97.7 F (36.5 C) (02/10 0531) Pulse Rate:  [68-70] 70 (02/10 0531) Resp:  [16-23] 18 (02/10 0531) BP: (103-130)/(69-90) 126/70 mmHg (02/10 0531) SpO2:  [92 %-100 %] 96 % (02/10 0531) Weight:  [289 lb 0.4 oz (131.1 kg)-296 lb 11.2 oz (134.582 kg)] 289 lb 0.4 oz (131.1 kg) (02/10 0531)  Intake/Output from previous day:  Intake/Output Summary (Last 24 hours) at 02/27/13 1033 Last data filed at 02/27/13 0900  Gross per 24 hour  Intake   1080 ml  Output   2230 ml  Net  -1150 ml    Physical Exam: General appearance: alert, cooperative, no distress and morbidly obese Lungs: clear to auscultation bilaterally Heart: regular rate and rhythm Extremities: trace edema   Rate: 75  Rhythm: Paced  Lab Results:  Recent Labs  02/26/13 1936  WBC 7.8  HGB 12.7*  PLT 304    Recent Labs  02/26/13 1936 02/27/13 0300  NA 141 143  K 4.5 4.2  CL 105 103  CO2 21 24  GLUCOSE 101* 119*  BUN 17 16  CREATININE 1.34 1.42*    Recent Labs  02/27/13 0305 02/27/13 0854  TROPONINI <0.30 <0.30    Recent Labs  02/27/13 0300  INR 1.59*    Imaging: Imaging results have been reviewed and Dg Chest 2 View  02/26/2013   CLINICAL DATA:  Shortness of breath and weakness.  Cough for 1 week.  EXAM: CHEST  2 VIEW  COMPARISON:  Single view of the chest 02/08/2013 and PA and lateral chest 01/07/2013.  FINDINGS: AICD again noted. There is marked cardiomegaly. Pulmonary vascular congestion without frank edema is noted. No pneumothorax or pleural effusion.  IMPRESSION: Cardiomegaly and pulmonary vascular congestion.  No acute finding.   Electronically Signed   By: Drusilla Kanner M.D.   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 Continue IV Lasix one more day. Check swallowing study - ? aspiration from prior CVA. Not on ACE or ARB secondary to cough.  Corine Shelter PA-C Beeper [phone] 02/27/2013, 10:33 AM   Agree with note written by Corine Shelter PAC  Pt with NISCM, Bi V-ICD admitted with recurrent volume overload. Wgt up 10#. Increased SOB. Getting IV diuretics and diuresing. Exam benign. 1+ LLE edema. I suspect that this will be a recurrent theme. Will get the CHF service to assist in his acre.  Runell Gess, M.D., FACP, [facility], [facility] [address] [place], [place]  [address]  [phone] 02/27/2013 12:10 PM   Runell Gess 02/27/2013 12:08 PM

## 2013-02-27 NOTE — Plan of Care (Signed)
Problem: Consults Goal: Heart Failure Patient Education (See Patient Education module for education specifics.)  Outcome: Progressing Pt received heart failure booklet and states he will review more of it later.

## 2013-02-27 NOTE — Progress Notes (Signed)
I briefly spoke with [name] regarding his heart failure.  He lives alone.  I reviewed HF education including importance of daily weights, signs and symptoms of heart failure, when to call the physician, low sodium diet and fluid restriction.  He says that he does not eat breakfast or lunch and only eats supper because his neighbor brings it to him.  He eats whatever she brings (he says its low sodium?).  He also claims that he cannot take Lasix "if going to town" as he will have to stop on the side of the road to urinate and possibly "get arrested".  He also says that he was told Lasix causes kidney failure.  I reinforced the importance of taking medications as prescribed.  He has the "Living with Heart Failure" book and says he will watch educational video tomorrow.  I will plan to check back in with him before discharge and speak with case management about a transition plan for him.  Driscilla Moats RN, BSN, PCCN--Heart Failure Statistician.

## 2013-02-27 NOTE — Progress Notes (Signed)
PHARMACIST - PHYSICIAN COMMUNICATION DR:  Anne Fu CONCERNING: Pharmacy Care Issues Regarding Warfarin Labs  RECOMMENDATION (Action Taken): A daily protime for three days has been ordered to meet the [facility] National Patient safety goal and comply with the current [facility] policy.   The Pharmacy will defer all warfarin dose order changes and follow up of lab results to the prescriber unless an additional order to initiate a "pharmacy Coumadin consult" is placed.  DESCRIPTION:  While hospitalized, to be in compliance with [employer] National Patient Safety Goals, all patients on warfarin must have a baseline and/or current protime prior to the administration of warfarin. Pharmacy has received your order for warfarin without these required laboratory assessments.   [place], [address].D., BCPS Clinical Pharmacist Pager: [phone] 02/27/2013 2:06 PM

## 2013-02-27 NOTE — ED Provider Notes (Signed)
Medical screening examination/treatment/procedure(s) were conducted as a shared visit with non-physician practitioner(s) and myself.  I personally evaluated the patient during the encounter.  pt with signs of CHF exacerbation despite taking 3 home diuretics.  Pt is SOB at rest and much worse with exertion.  Labs consistent with CHF.  Given lasix and admitted to cardiology.  Gwyneth Sprout, MD 02/27/13 2229

## 2013-02-27 NOTE — Progress Notes (Signed)
[facility]  Patient Status: Active (receiving services up to time of hospitalization)  [facility] is providing the following services: RN, OT and MSW  If patient discharges after hours, please call [phone].   Raymond [name] 02/27/2013, 3:21 PM

## 2013-02-28 DIAGNOSIS — I472 Ventricular tachycardia: Secondary | ICD-10-CM | POA: Diagnosis not present

## 2013-02-28 DIAGNOSIS — I4729 Other ventricular tachycardia: Secondary | ICD-10-CM | POA: Diagnosis not present

## 2013-02-28 LAB — BASIC METABOLIC PANEL
BUN: 19 mg/dL (ref 6–23)
CALCIUM: 8.5 mg/dL (ref 8.4–10.5)
CO2: 27 mEq/L (ref 19–32)
Chloride: 101 mEq/L (ref 96–112)
Creatinine, Ser: 1.54 mg/dL — ABNORMAL HIGH (ref 0.50–1.35)
GFR calc Af Amer: 59 mL/min — ABNORMAL LOW (ref >90)
GFR, EST NON AFRICAN AMERICAN: 51 mL/min — AB (ref >90)
Glucose, Bld: 87 mg/dL (ref 70–99)
Potassium: 4.1 mEq/L (ref 3.7–5.3)
Sodium: 142 mEq/L (ref 137–147)

## 2013-02-28 LAB — PROTIME-INR
INR: 1.59 — ABNORMAL HIGH (ref 0.00–1.49)
Prothrombin Time: 18.5 seconds — ABNORMAL HIGH (ref 11.6–15.2)

## 2013-02-28 LAB — PRO B NATRIURETIC PEPTIDE: Pro B Natriuretic peptide (BNP): 2451 pg/mL — ABNORMAL HIGH (ref 0–125)

## 2013-02-28 LAB — MAGNESIUM: Magnesium: 1.8 mg/dL (ref 1.5–2.5)

## 2013-02-28 MED ORDER — WARFARIN - PHARMACIST DOSING INPATIENT
Freq: Every day | Status: DC
  Administered 2013-02-28: 18:00:00

## 2013-02-28 MED ORDER — WARFARIN SODIUM 2.5 MG PO TABS
2.5000 mg | ORAL_TABLET | Freq: Once | ORAL | Status: AC
  Administered 2013-02-28: 2.5 mg via ORAL
  Filled 2013-02-28: qty 1

## 2013-02-28 MED ORDER — METOPROLOL TARTRATE 25 MG PO TABS
25.0000 mg | ORAL_TABLET | Freq: Two times a day (BID) | ORAL | Status: DC
  Administered 2013-02-28 – 2013-03-01 (×2): 25 mg via ORAL
  Filled 2013-02-28 (×4): qty 1

## 2013-02-28 NOTE — Consult Note (Signed)
ELECTROPHYSIOLOGY CONSULT NOTE    Patient ID: Raymond [name] MRN: [id number], DOB/AGE: 03-23-1960 53 y.o.  Admit date: 02/26/2013 Date of Consult: 02-28-2013  Primary Physician: No PCP Per Patient Primary Cardiologist: [name], MD Electrophysiologist: Sherryl Manges, MD   Reason for Consultation: atrial fibrillation  HPI:  Raymond [name] is a 53 year old male with a past medical history significant for non ischemic cardiomyopathy s/p CRTD implantation, atrial fibrillation, s/p AVN ablation, hypothyroidism, prior CVA, hypertension, and obesity.    His last HF admission was in December of 2014 at which time it was felt his decompensation was related to afib with RVR and loss of BiV pacing.  He underwent AVN ablation on 01-09-2013 with Dr [name].  He previously failed rhythm control strategies with Amiodarone.   He reports doing well since that time until 2 days ago when he developed acute worsening of dyspnea on exertion as well as a 5 pound weight gain.  He was admitted and has undergone 4L diuresis to date.  He is still complaining of dyspnea on exertion as well as lower extremity edema.  He reports medication and diet compliance.   Recent high dose use of NSAIDs cx by rectal and tussive bleeding  Review of telemetry demonstrates ventricular pacing with VA dissociation.  Device interrogation confirms return to sinus rhythm and his device has been reprogrammed to DDIR.   EP has been asked to evaluate.  ROS is negative except as outlined above.   Past Medical History  Diagnosis Date  . Systolic CHF, chronic   . Non-ischemic cardiomyopathy     a. echo 11/10: EF 15%;   b. cath 10/08: normal cors;  c. Myoview 5/12: Very mild distal ant and apical isch, EF 17% (low risk-medical Rx cont'd);  d.  Echo 4/14:  EF 15%;  e. 05/2009 s/p SJM BiV ICD;  f. 07/2012 TEE: EF 20-25%, diff HK mild MR, mod TR.   Marland Kitchen Atrial fibrillation     a. on coumadin;  b. 07/2012 s/p TEE/DCCV. c. Recurrence 08/2012 in  setting of abnormal thyroid panel.; 12/2012 AVN ablation by Dr [name]  . Hypertension   . Hypothyroidism     s/p RAI therapy  . Obesity   . Non-compliance   . Dyslexia   . 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 RBBB (right bundle branch block)   . Cough syncope     a. During 08/2012 adm.     Surgical History:  Past Surgical History  Procedure Laterality Date  . Bi-ventricular implantable cardioverter defibrillator  (crt-d)  06/11/09    [employer] [id number] BIVENTRICULAR AICD SERIAL #[id number]  . Knee arthroscopy Left ~ 1995  . Cardiac catheterization      "several time" (09/01/2012)  . Tee without cardioversion N/A 06/23/2012    Procedure: TRANSESOPHAGEAL ECHOCARDIOGRAM (TEE);  Surgeon: Vesta Mixer, MD;  Location: [facility] ENDOSCOPY;  Service: Cardiovascular;  Laterality: N/A;  TEE will be done at 0730   . Cardioversion N/A 07/27/2012    Procedure: CARDIOVERSION;  Surgeon: Vesta Mixer, MD;  Location: [facility] ENDOSCOPY;  Service: Cardiovascular;  Laterality: N/A;  . Tee without cardioversion N/A 08/10/2012    Procedure: TRANSESOPHAGEAL ECHOCARDIOGRAM (TEE);  Surgeon: Wendall Stade, MD;  Location[facility] ENDOSCOPY;  Service: Cardiovascular;  Laterality: N/A;  . Cardioversion N/A 08/10/2012    Procedure: CARDIOVERSION;  Surgeon: Wendall Stade, MD;  Location: [facility]  ENDOSCOPY;  Service: Cardiovascular;  Laterality: N/A;  . Sp pta add intra cran  11/2008    a. 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 Ablation  01/09/2013    AVN ablation by Dr [name]     Prescriptions prior to admission  Medication Sig Dispense Refill  . acyclovir (ZOVIRAX) 800 MG tablet Take 400 mg by mouth daily.      . digoxin (LANOXIN) 0.125 MG tablet Take 0.0625 mg by mouth at bedtime.       Marland Kitchen levothyroxine (SYNTHROID, LEVOTHROID) 125 MCG tablet Take 250 mcg by mouth daily before breakfast.      . losartan (COZAAR) 50  MG tablet Take 0.5 tablets (25 mg total) by mouth daily.      . metolazone (ZAROXOLYN) 2.5 MG tablet Take 2.5 mg by mouth daily.       . metoprolol (LOPRESSOR) 50 MG tablet Take 2 tablets (100 mg total) by mouth 2 (two) times daily.      . potassium chloride SA (KLOR-CON M20) 20 MEQ tablet Take 60 mEq by mouth at bedtime.      Marland Kitchen spironolactone (ALDACTONE) 12.5 mg TABS tablet Take 12.5 mg by mouth daily.      Marland Kitchen torsemide (DEMADEX) 20 MG tablet Take 1 tablet (20 mg total) by mouth 2 (two) times daily.  60 tablet  11  . warfarin (COUMADIN) 7.5 MG tablet Take 7.5 mg by mouth daily at 6 PM.        Inpatient Medications:  . acyclovir  400 mg Oral Daily  . antiseptic oral rinse  15 mL Mouth Rinse q12n4p  . chlorhexidine  15 mL Mouth Rinse BID  . furosemide  40 mg Intravenous BID  . levothyroxine  250 mcg Oral QAC breakfast  . losartan  25 mg Oral Daily  . metoprolol  100 mg Oral BID  . sodium chloride  3 mL Intravenous Q12H  . spironolactone  12.5 mg Oral Daily  . warfarin  7.5 mg Oral q1800  . Warfarin - Physician Dosing Inpatient   Does not apply q1800    Allergies:  Allergies  Allergen Reactions  . Losartan Cough  . Valsartan Cough    History   Social History  . Marital Status: Legally Separated    Spouse Name: N/A    Number of Children: 0  . Years of Education: N/A   Occupational History  . DISABLED    Social History Main Topics  . Smoking status: Never Smoker   . Smokeless tobacco: Never Used  . Alcohol Use: No  . Drug Use: No  . Sexual Activity: Not Currently   Other Topics Concern  . Not on file   Social History Narrative   Lives alone in [place].  Disabled from CVA.      Family History  Problem Relation Age of Onset  . Heart disease Father     BP 115/68  Pulse 80  Temp(Src) 97.6 F (36.4 C) (Oral)  Resp 18  Ht 5\' 9"  (1.753 m)  Wt 288 lb 9.6 oz (130.908 kg)  BMI 42.60 kg/m2  SpO2 98% Well developed and nourished in no acute distress HENT  normal Neck supple    Clear Regular rate and rhythm, 2/6 systolic  Abd-soft with active BS No Clubbing cyanosis  1+edema Skin-warm and dry A & Oriented  Grossly normal sensory and motor function   Labs:   Lab Results  Component Value Date   WBC 7.8 02/26/2013  HGB 12.7* 02/26/2013   HCT 39.8 02/26/2013   MCV 86.0 02/26/2013   PLT 304 02/26/2013    Recent Labs Lab 02/28/13 0334  NA 142  K 4.1  CL 101  CO2 27  BUN 19  CREATININE 1.54*  CALCIUM 8.5  GLUCOSE 87   INR -  1.59  Radiology/Studies: Dg Chest 2 View 02/26/2013   CLINICAL DATA:  Shortness of breath and weakness.  Cough for 1 week.  EXAM: CHEST  2 VIEW  COMPARISON:  Single view of the chest 02/08/2013 and PA and lateral chest 01/07/2013.  FINDINGS: AICD again noted. There is marked cardiomegaly. Pulmonary vascular congestion without frank edema is noted. No pneumothorax or pleural effusion.  IMPRESSION: Cardiomegaly and pulmonary vascular congestion.  No acute finding.   Electronically Signed   By: Drusilla Kannerhomas  Dalessio M.D.   On: 02/26/2013 21:00   EKG: V paced at 80 with VA dissociation  TELEMETRY: V paced at 80  DEVICE HISTORY: STJ [id number] implanted 2011 by Dr Graciela HusbandsKlein    Assessment and plan  CHFn acute/chronic systolic  NICM  AFib paroxysmal  S/p AV ablation  CRT-D in place previously programmed VVI now DDIR  Recent use of NSAIDS  Recent rectal bleeding  Subtherapeutic INR    The pt presented with acute decompensation, likely related to NSAIDs but aggravated by VVI pacing in the setting of isorhythmic av dissociation  We have reprogrammed his device  VVIR>>DDIR to allow fro tracking when in sinus  Encouraged him to refrain from NSAIDs  Agree with need for GI eval  Will consider later the use of NOAC as he is high risk for thromboembolism

## 2013-02-28 NOTE — Progress Notes (Signed)
ANTICOAGULATION CONSULT NOTE - Initial Consult  Pharmacy Consult for warfarin Indication: atrial fibrillation  Allergies  Allergen Reactions  . Losartan Cough  . Valsartan Cough    Patient Measurements: Height: 5\' 9"  (175.3 cm) Weight: 288 lb 9.6 oz (130.908 kg) (b scale) IBW/kg (Calculated) : 70.7   Vital Signs: Temp: 97.6 F (36.4 C) (02/11 1459) Temp src: Oral (02/11 1459) BP: 115/68 mmHg (02/11 1459) Pulse Rate: 80 (02/11 1459)  Labs:  Recent Labs  02/26/13 02/26/13 1936 02/27/13 0300 02/27/13 0305 02/27/13 0854 02/28/13 0334  HGB  --  12.7*  --   --   --   --   HCT  --  39.8  --   --   --   --   PLT  --  304  --   --   --   --   LABPROT  --   --  18.5*  --   --  18.5*  INR 1.7  --  1.59*  --   --  1.59*  CREATININE  --  1.34 1.42*  --   --  1.54*  TROPONINI  --   --   --  <0.30 <0.30  --     Estimated Creatinine Clearance: 76.9 ml/min (by C-G formula based on Cr of 1.54).   Medical History: Past Medical History  Diagnosis Date  . Systolic CHF, chronic   . Non-ischemic cardiomyopathy     a. echo 11/10: EF 15%;   b. cath 10/08: normal cors;  c. Myoview 5/12: Very mild distal ant and apical isch, EF 17% (low risk-medical Rx cont'd);  d.  Echo 4/14:  EF 15%;  e. 05/2009 s/p SJM BiV ICD;  f. 07/2012 TEE: EF 20-25%, diff HK mild MR, mod TR.   Marland Kitchen Atrial fibrillation     a. on coumadin;  b. 07/2012 s/p TEE/DCCV. c. Recurrence 08/2012 in setting of abnormal thyroid panel.; 12/2012 AVN ablation by Dr [name]  . Hypertension   . Hypothyroidism     s/p RAI therapy  . Obesity   . Non-compliance   . Dyslexia   . 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 RBBB (right bundle branch block)   . Cough syncope     a. During 08/2012 adm.      Assessment: 52yom admitted with DOE and 5lb wt gain.  Hx HF has diuresed 3L since admit and SOB improved.  Pt with Hx Afib and is anticoagulated pta with Coumadin  7.5mg  daily.  Recent outpt INR was 5 with dose reduction was 1.7 and 1.6 on admission.  Plan to increase coumadin dose for goal INR 2-3.  No bleeding, CBC stable. Goal of Therapy:  INR 2-3 Monitor platelets by anticoagulation protocol: Yes   Plan:  Warfarin 7.5mg  already given  Will give warfarin 2.5mg  x1 for total dose today 10mg  Daily INR  Leota Sauers Pharm.D. CPP, BCPS Clinical Pharmacist [phone] 02/28/2013 5:31 PM

## 2013-02-28 NOTE — Progress Notes (Signed)
I cosign with Laura B Caldwell's assessments, medication administration, notes, intake and output, and documentation for this shift. Marquise Wicke A, RN    

## 2013-02-28 NOTE — Evaluation (Signed)
Clinical/Bedside Swallow Evaluation Patient Details  Name: Madelon LipsRonald S Drew MRN: [id number] Date of Birth: [birth year]  Today's Date: 02/28/2013 Time: 1020-1042 SLP Time Calculation (min): 22 min  Past Medical History:  Past Medical History  Diagnosis Date  . Systolic CHF, chronic   . Non-ischemic cardiomyopathy     a. echo 11/10: EF 15%;   b. cath 10/08: normal cors;  c. Myoview 5/12: Very mild distal ant and apical isch, EF 17% (low risk-medical Rx cont'd);  d.  Echo 4/14:  EF 15%;  e. 05/2009 s/p SJM BiV ICD;  f. 07/2012 TEE: EF 20-25%, diff HK mild MR, mod TR.   Marland Kitchen. Atrial fibrillation     a. on coumadin;  b. 07/2012 s/p TEE/DCCV. c. Recurrence 08/2012 in setting of abnormal thyroid panel.; 12/2012 AVN ablation by Dr Ladona Ridgelaylor  . Hypertension   . Hypothyroidism     s/p RAI therapy  . Obesity   . Non-compliance   . Dyslexia   . 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 RBBB (right bundle branch block)   . Cough syncope     a. During 08/2012 adm.   Past Surgical History:  Past Surgical History  Procedure Laterality Date  . Bi-ventricular implantable cardioverter defibrillator  (crt-d)  06/11/09    [employer] [id number] BIVENTRICULAR AICD SERIAL #[id number]  . Knee arthroscopy Left ~ 1995  . Cardiac catheterization      "several time" (09/01/2012)  . Tee without cardioversion N/A 06/23/2012    Procedure: TRANSESOPHAGEAL ECHOCARDIOGRAM (TEE);  Surgeon: Vesta MixerPhilip J Nahser, MD;  [facility];  Service: Cardiovascular;  Laterality: N/A;  TEE will be done at 0730   . Cardioversion N/A 07/27/2012    Procedure: CARDIOVERSION;  Surgeon: Vesta MixerPhilip J Nahser, MD;  [facility];  Service: Cardiovascular;  Laterality: N/A;  . Tee without cardioversion N/A 08/10/2012    Procedure: TRANSESOPHAGEAL ECHOCARDIOGRAM (TEE);  Surgeon: Wendall StadePeter C Nishan, MD;  [facility];  Service: Cardiovascular;  Laterality: N/A;  .  Cardioversion N/A 08/10/2012    Procedure: CARDIOVERSION;  Surgeon: Wendall StadePeter C Nishan, MD;  [facility];  Service: Cardiovascular;  Laterality: N/A;  . Sp pta add intra cran  11/2008    a. 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 Ablation  01/09/2013    AVN ablation by Dr Ladona Ridgelaylor   HPI:  Recurrent CHF, just discharged 02/04/13. Continue IV Lasix one more day. Pt c/o frequent coughing, sore throat, coughing while talking and with eating. He reports a sudden vomiting episode in ED. He had a R CVA in 2010, but denies any impact on swallow. He had MBS at that time, but report cannot be accessed. CXR without pna.    Assessment / Plan / Recommendation Clinical Impression  Pt presents with multiple signs concerning for a primary esopahgeal dysphagia. His swallow is strong and timely and he denies history of trouble swallowing lasting since 2010 when he had CVA. He has not had recurrent pna per imaging history, therefore dysphagia from CVA unlikely. He does however have hoarse vocal quality, frequent throat clearing, recent vomiting episode and delayed cough with appearance of possible backflow or regurgitation after a few sips of liquids. Would favor esophageal dysphagia over oropharyngeal dysphagia. Suggest Barium swallow (DG esophgus) to provide information on esophageal function as well as comments from radiologist on airway protection. Discussed with cardiology.  Aspiration Risk  Moderate    Diet Recommendation Regular;Thin liquid   Liquid Administration via: Cup;Straw Medication Administration: Whole meds with liquid Supervision: Patient able to self feed Compensations: Slow rate;Small sips/bites Postural Changes and/or Swallow Maneuvers: Seated upright 90 degrees;Upright 30-60 min after meal    Other  Recommendations Recommended Consults: Consider esophageal assessment Oral Care Recommendations: Oral care BID   Follow Up Recommendations  None     Frequency and Duration min 2x/week  2 weeks   Pertinent Vitals/Pain NA    SLP Swallow Goals     Swallow Study Prior Functional Status       General HPI: Recurrent CHF, just discharged 02/04/13. Continue IV Lasix one more day. Pt c/o frequent coughing, sore throat, coughing while talking and with eating. He reports a sudden vomiting episode in ED. He had a R CVA in 2010, but denies any impact on swallow. He had MBS at that time, but report cannot be accessed. CXR without pna.  Type of Study: Bedside swallow evaluation Previous Swallow Assessment: MBS 2010?  Diet Prior to this Study: Regular;Thin liquids Temperature Spikes Noted: No Respiratory Status: Room air History of Recent Intubation: No Behavior/Cognition: Alert;Cooperative;Pleasant mood Oral Cavity - Dentition: Adequate natural dentition Self-Feeding Abilities: Able to feed self Patient Positioning: Upright in chair Baseline Vocal Quality: Hoarse Volitional Cough: Strong Volitional Swallow: Able to elicit    Oral/Motor/Sensory Function Overall Oral Motor/Sensory Function: Appears within functional limits for tasks assessed   Ice Chips     Thin Liquid Thin Liquid: Impaired Presentation: Cup;Self Fed Pharyngeal  Phase Impairments: Throat Clearing - Immediate;Cough - Delayed    Nectar Thick Nectar Thick Liquid: Not tested   Honey Thick Honey Thick Liquid: Not tested   Puree Puree: Impaired Presentation: Spoon;Self Fed Pharyngeal Phase Impairments: Throat Clearing - Immediate   Solid   GO    Solid: Not tested      Harlon Ditty, MA CCC-SLP [phone]  Karla Pavone, Riley Nearing 02/28/2013,10:49 AM

## 2013-02-28 NOTE — Progress Notes (Signed)
Subjective:  Still complaining of DOE. He was unaware of NSWCT last night.   Objective:  Vital Signs in the last 24 hours: Temp:  [97.2 F (36.2 C)-97.4 F (36.3 C)] 97.2 F (36.2 C) (02/11 0525) Pulse Rate:  [69-77] 70 (02/11 0525) Resp:  [16-20] 18 (02/11 0525) BP: (102-127)/(55-79) 118/79 mmHg (02/11 0525) SpO2:  [95 %-99 %] 95 % (02/11 0525) Weight:  [288 lb 9.6 oz (130.908 kg)] 288 lb 9.6 oz (130.908 kg) (02/11 0525)  Intake/Output from previous day:  Intake/Output Summary (Last 24 hours) at 02/28/13 1224 Last data filed at 02/28/13 0900  Gross per 24 hour  Intake   1070 ml  Output   2775 ml  Net  -1705 ml    Physical Exam: General appearance: alert, cooperative, no distress and morbidly obese Lungs: clear to auscultation bilaterally Heart: regular rate and rhythm Extremities: 1+ bilat LE edema   Rate: 70  Rhythm: Paced with a 34 beat run of NSWCT at 150  Lab Results:  Recent Labs  02/26/13 1936  WBC 7.8  HGB 12.7*  PLT 304    Recent Labs  02/27/13 0300 02/28/13 0334  NA 143 142  K 4.2 4.1  CL 103 101  CO2 24 27  GLUCOSE 119* 87  BUN 16 19  CREATININE 1.42* 1.54*    Recent Labs  02/27/13 0305 02/27/13 0854  TROPONINI <0.30 <0.30    Recent Labs  02/28/13 0334  INR 1.59*    Imaging: Imaging results have been reviewed  Cardiac Studies:  Assessment/Plan:   Principal Problem:   Acute on chronic clinical systolic heart failure Active Problems:   Biventricular implantable cardioverter-defibrillator in situ   Non-ischemic cardiomyopathy   Chronic systolic heart failure   CKD (chronic kidney disease), stage III   PAF-RFA  01/07/13- Amiodarone stopped   NSVT (nonsustained ventricular tachycardia)   Morbid obesity-BMI43   Hypertensive heart disease   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 Consider GI evaluation (see speech therapy note).  Will ask EP and CHF to see.   Corine Shelter PA-C Beeper [phone] 02/28/2013, 12:24 PM  Patient seen and examined with Corine Shelter, PA-C. We discussed all aspects of the encounter. I agree with the assessment and plan as stated above.   [name] has done very well with IV diuresis and now nearing his baseline weight. Suspect we can switch back to po diuretics tomorrow and send him home. He has been compliant with his HF regimen at home though he I think he could benefit from some more HF education and closer f/u in HF Clinic. EP has seen and found him to be back in NSR and thus her device was reprogrammed to avoid A-V dyssynchrony which may have contributed to his decompensation.   Raymond Bensimhon,MD 11:20 PM

## 2013-02-28 NOTE — Consult Note (Signed)
Referring Provider: Dr. Nanetta Batty Primary Care Physician:  No PCP Per Patient Primary Gastroenterologist:  None (unassigned)  Reason for Consultation:  Cough,? 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   Initially, it was thought that this cough was present primarily after eating, but the patient and his girlfriend who is at the bedside indicate that the cough is random, throughout the day, and not just postprandial in character.   Initially, there was a concern that he might be aspirating while eating, so a bedside swallowing evaluation was performed which came to the conclusion that he was not suffering from oropharyngeal dysphagia. However, the possibility of an esophageal problem was raised, prompting a request that we see the patient for evaluation.   The patient does not have significant classic reflux symptomatology such as sour belches, regurgitation, or heartburn. No problem with esophageal dysphagia.   From the LPR (laryngeal pharyngeal reflux) perspective, there has been no change in his voice, nor any globus sensation. He does have some degree of frequent throat clearing, as well as a deep cough which is nonproductive or, in the morning, minimally productive. He has not had pulmonary evaluation.   Past Medical History  Diagnosis Date  . Systolic CHF, chronic   . Non-ischemic cardiomyopathy     a. echo 11/10: EF 15%;   b. cath 10/08: normal cors;  c. Myoview 5/12: Very mild distal ant and apical isch, EF 17% (low risk-medical Rx cont'd);  d.  Echo 4/14:  EF 15%;  e. 05/2009 s/p SJM BiV ICD;  f. 07/2012 TEE: EF 20-25%, diff HK mild MR, mod TR.   Marland Kitchen Atrial fibrillation     a. on coumadin;  b.  07/2012 s/p TEE/DCCV. c. Recurrence 08/2012 in setting of abnormal thyroid panel.; 12/2012 AVN ablation by Dr [name]  . Hypertension   . Hypothyroidism     s/p RAI therapy  . Obesity   . Non-compliance   . Dyslexia   . 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 RBBB (right bundle branch block)   . Cough syncope     a. During 08/2012 adm.    Past Surgical History  Procedure Laterality Date  . Bi-ventricular implantable cardioverter defibrillator  (crt-d)  06/11/09    [facility] [employer] UNIFY GN5621-30 BIVENTRICULAR AICD SERIAL #[id number]  . Knee arthroscopy Left ~ 1995  . Cardiac catheterization      "several time" (09/01/2012)  . Tee without cardioversion N/A 06/23/2012    Procedure: TRANSESOPHAGEAL ECHOCARDIOGRAM (TEE);  Surgeon: Vesta Mixer, MD;  Location: [facility] ENDOSCOPY;  Service: Cardiovascular;  Laterality: N/A;  TEE will be done at 0730   . Cardioversion N/A 07/27/2012    Procedure: CARDIOVERSION;  Surgeon: Vesta Mixer, MD;  Location: [facility] ENDOSCOPY;  Service: Cardiovascular;  Laterality: N/A;  . Tee without cardioversion N/A 08/10/2012    Procedure: TRANSESOPHAGEAL ECHOCARDIOGRAM (TEE);  Surgeon: Wendall Stade, MD;  Location: [facility] ENDOSCOPY;  Service: Cardiovascular;  Laterality: N/A;  . Cardioversion N/A 08/10/2012    Procedure: CARDIOVERSION;  Surgeon: Wendall Stade, MD;  Location: [facility] ENDOSCOPY;  Service: Cardiovascular;  Laterality: N/A;  . Sp pta  add intra cran  11/2008    a. 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 Ablation  01/09/2013    AVN ablation by Dr [name]    Prior to Admission medications   Medication Sig Start Date End Date Taking? Authorizing Provider  acyclovir (ZOVIRAX) 800 MG tablet Take 400 mg by mouth daily.   Yes Historical Provider, MD  digoxin (LANOXIN) 0.125 MG tablet Take 0.0625 mg by mouth at bedtime.    Yes Historical Provider, MD  levothyroxine (SYNTHROID,  LEVOTHROID) 125 MCG tablet Take 250 mcg by mouth daily before breakfast.   Yes Historical Provider, MD  losartan (COZAAR) 50 MG tablet Take 0.5 tablets (25 mg total) by mouth daily. 09/04/12  Yes Dayna N Dunn, PA-C  metolazone (ZAROXOLYN) 2.5 MG tablet Take 2.5 mg by mouth daily.  06/26/12  Yes Historical Provider, MD  metoprolol (LOPRESSOR) 50 MG tablet Take 2 tablets (100 mg total) by mouth 2 (two) times daily. 09/04/12  Yes Dayna N Dunn, PA-C  potassium chloride SA (KLOR-CON M20) 20 MEQ tablet Take 60 mEq by mouth at bedtime.   Yes Historical Provider, MD  spironolactone (ALDACTONE) 12.5 mg TABS tablet Take 12.5 mg by mouth daily.   Yes Historical Provider, MD  torsemide (DEMADEX) 20 MG tablet Take 1 tablet (20 mg total) by mouth 2 (two) times daily. 08/11/12  Yes Rhonda G Barrett, PA-C  warfarin (COUMADIN) 7.5 MG tablet Take 7.5 mg by mouth daily at 6 PM.   Yes Historical Provider, MD    Current Facility-Administered Medications  Medication Dose Route Frequency Provider Last Rate Last Dose  . 0.9 %  sodium chloride infusion  250 mL Intravenous PRN Lenon Oms, MD      . acetaminophen (TYLENOL) tablet 650 mg  650 mg Oral Q4H PRN Lenon Oms, MD   650 mg at 02/27/13 1135  . acyclovir (ZOVIRAX) tablet 400 mg  400 mg Oral Daily Lenon Oms, MD   400 mg at 02/28/13 1012  . antiseptic oral rinse (BIOTENE) solution 15 mL  15 mL Mouth Rinse q12n4p Donato Schultz, MD   15 mL at 02/28/13 1600  . chlorhexidine (PERIDEX) 0.12 % solution 15 mL  15 mL Mouth Rinse BID Donato Schultz, MD   15 mL at 02/28/13 0857  . furosemide (LASIX) injection 40 mg  40 mg Intravenous BID Lenon Oms, MD   40 mg at 02/28/13 1708  . guaiFENesin-codeine 100-10 MG/5ML solution 10 mL  10 mL Oral Q4H PRN Lenon Oms, MD   10 mL at 02/27/13 1910  . levothyroxine (SYNTHROID, LEVOTHROID) tablet 250 mcg  250 mcg Oral QAC breakfast Lenon Oms, MD   250 mcg at 02/28/13 [id number]  . losartan (COZAAR) tablet 25 mg   25 mg Oral Daily Abelino Derrick, PA-C   25 mg at 02/28/13 1012  . metoprolol (LOPRESSOR) tablet 100 mg  100 mg Oral BID Lenon Oms, MD   100 mg at 02/28/13 1012  . ondansetron (ZOFRAN) injection 4 mg  4 mg Intravenous Q6H PRN Lenon Oms, MD      . sodium chloride 0.9 % injection 3 mL  3 mL Intravenous Q12H Lenon Oms, MD   3 mL at 02/28/13 1020  . sodium chloride 0.9 % injection 3 mL  3 mL Intravenous PRN Lenon Oms, MD      . spironolactone (ALDACTONE) tablet 12.5 mg  12.5 mg Oral Daily Lenon Oms, MD   12.5 mg at 02/28/13 1012  . Warfarin -  Pharmacist Dosing Inpatient   Does not apply q1800 Donato SchultzMark Skains, MD        Allergies as of 02/26/2013 - Review Complete 02/26/2013  Allergen Reaction Noted  . Losartan Cough 10/20/2011  . Valsartan Cough 10/20/2011    Family History  Problem Relation Age of Onset  . Heart disease Father     History   Social History  . Marital Status: Legally Separated    Spouse Name: N/A    Number of Children: 0  . Years of Education: N/A   Occupational History  . DISABLED    Social History Main Topics  . Smoking status: Never Smoker   . Smokeless tobacco: Never Used  . Alcohol Use: No  . Drug Use: No  . Sexual Activity: Not Currently   Other Topics Concern  . Not on file   Social History Narrative   Lives alone in [place].  Disabled from CVA.     Review of Systems: Apparently negative for upper or lower GI tract symptoms such as anorexia, weight loss, dysphagia, reflux, nausea or vomiting, abdominal pain, constipation, diarrhea, or rectal bleeding. Physical Exam: Vital signs in last 24 hours: Temp:  [97.2 F (36.2 C)-97.6 F (36.4 C)] 97.6 F (36.4 C) (02/11 1459) Pulse Rate:  [69-80] 80 (02/11 1459) Resp:  [16-18] 18 (02/11 1459) BP: (102-120)/(55-79) 115/68 mmHg (02/11 1459) SpO2:  [95 %-99 %] 98 % (02/11 1459) Weight:  [130.908 kg (288 lb 9.6 oz)] 130.908 kg (288 lb 9.6 oz) (02/11 0525) Last BM Date:  02/26/13 This is an overweight, articulate, pleasant Caucasian male sitting in a bedside chair, having eaten half of his [employer] sandwich. He is in no evident distress, in particular, no respiratory distress. He did cough a couple of times during my 20 minute visit. The cough is moderately deep and sounds dry. His vocal quality is slightly raspy. The chest is completely clear to auscultation. Heart at this time appears to have a regular rhythm, no murmur appreciated. Abdomen without overt mass or tenderness. No clubbing, cyanosis, or edema. Oropharynx benign, neck without adenopathy or masses.   Intake/Output from previous day: 02/10 0701 - 02/11 0700 In: 1070 [P.O.:1070] Out: 3375 [Urine:3375] Intake/Output this shift:    Lab Results:  Recent Labs  02/26/13 1936  WBC 7.8  HGB 12.7*  HCT 39.8  PLT 304   BMET  Recent Labs  02/26/13 1936 02/27/13 0300 02/28/13 0334  NA 141 143 142  K 4.5 4.2 4.1  CL 105 103 101  CO2 21 24 27   GLUCOSE 101* 119* 87  BUN 17 16 19   CREATININE 1.34 1.42* 1.54*  CALCIUM 8.7 8.6 8.5   LFT No results found for this basename: PROT, ALBUMIN, AST, ALT, ALKPHOS, BILITOT, BILIDIR, IBILI,  in the last 72 hours PT/INR  Recent Labs  02/27/13 0300 02/28/13 0334  LABPROT 18.5* 18.5*  INR 1.59* 1.59*     Studies/Results: Dg Chest 2 View  02/26/2013   CLINICAL DATA:  Shortness of breath and weakness.  Cough for 1 week.  EXAM: CHEST  2 VIEW  COMPARISON:  Single view of the chest 02/08/2013 and PA and lateral chest 01/07/2013.  FINDINGS: AICD again noted. There is marked cardiomegaly. Pulmonary vascular congestion without frank edema is noted. No pneumothorax or pleural effusion.  IMPRESSION: Cardiomegaly and pulmonary vascular congestion.  No acute finding.   Electronically Signed   By: Drusilla Kannerhomas  Dalessio M.D.   On: 02/26/2013 21:00    Impression: Chronic cough of unclear cause.  I agree with the speech pathologist that this does not sound like aspiration  do to oropharyngeal dysphagia. I think the main differential diagnosis is between laryngeal pharyngeal reflux, or a primary pulmonary process. Go against the diagnosis of LPR is the fact that the cough is a very deep cough, as opposed to the mild, "tickle" cough which is more characteristic of LPR.  Plan: 1. Upper GI series to look for evidence of overt reflux 2. Would avoid endoscopic evaluation in this patient who would be at relatively high risk for the procedure in view of his severe cardiac limitations, and in view of the fact that endoscopy is usually unrevealing in this setting, and would not likely prove either the presence or the absence of LPR 3. I would recommend pulmonary consultation to help discern whether this cough could be the result of a primary pulmonary process, although this could readily be done as an outpatient   LOS: 2 days   Vaanya Shambaugh V  02/28/2013, 7:54 PM

## 2013-02-28 NOTE — Progress Notes (Signed)
I co-sign Cline Crock assessment, notes and documentation

## 2013-02-28 NOTE — Progress Notes (Signed)
Pt stable, VSS. Pt received pneumococcal vaccine.

## 2013-02-28 NOTE — Evaluation (Addendum)
Physical Therapy Evaluation Patient Details Name: Raymond [name] MRN: [id number] DOB: 12-Sep-1960 Today's Date: 02/28/2013 Time: [id number] PT Time Calculation (min): 34 min  PT Assessment / Plan / Recommendation History of Present Illness  Raymond [name] is a 53 y.o. male who presents for evaluation of increased weight.  He has known hx of NICM, EF 15%, chronic systolic CHF, AFib and right brain stroke in 11/10 with residual left-sided weakness, status post CRT-D, CKD, hypothyroidism.   Clinical Impression  Pt admitted with above.  Will follow pt acutely to work towards returning to PLOF.  Pt close to baseline level and don't anticipate pt will need HHPT.    PT Assessment  Patient needs continued PT services    Follow Up Recommendations  No PT follow up (Pt was receiving HHOT)    Does the patient have the potential to tolerate intense rehabilitation      Barriers to Discharge        Equipment Recommendations  None recommended by PT    Recommendations for Other Services OT consult   Frequency Min 3X/week    Precautions / Restrictions     Pertinent Vitals/Pain 2/10 pain in R foot from cut, O2 97% after gait on room air.      Mobility  Transfers Overall transfer level: Modified independent Equipment used: Straight cane Ambulation/Gait Ambulation/Gait assistance: Min guard Ambulation Distance (Feet): 60 Feet Assistive device: Straight cane Gait Pattern/deviations: Decreased dorsiflexion - left;Step-through pattern;Decreased stance time - right    Exercises     PT Diagnosis: Difficulty walking  PT Problem List: Decreased activity tolerance;Decreased mobility;Decreased balance PT Treatment Interventions: Gait training;Functional mobility training;Therapeutic activities;Therapeutic exercise     PT Goals(Current goals can be found in the care plan section) Acute Rehab PT Goals Patient Stated Goal: To go back home PT Goal Formulation: With patient Time For Goal  Achievement: 03/14/13 Potential to Achieve Goals: Good  Visit Information  Last PT Received On: 02/28/13 Assistance Needed: +1 History of Present Illness: Raymond [name] is a 53 y.o. male who presents for evaluation of increased weight.  He has known hx of NICM, EF 15%, chronic systolic CHF, AFib and right brain stroke in 11/10 with residual left-sided weakness, status post CRT-D, CKD, hypothyroidism.        Prior Functioning  Home Living Family/patient expects to be discharged to:: Private residence Living Arrangements: Alone Available Help at Discharge: Family;Available PRN/intermittently Type of Home: House Home Access: Stairs to enter [employer] of Steps: 1 Entrance Stairs-Rails:  (leaves quad cane on porch) Home Equipment: Cane - quad;Cane - single point;Grab bars - tub/shower Prior Function Level of Independence: Independent with assistive device(s) Comments: Amb with cane.    Cognition  Cognition Arousal/Alertness: Awake/alert Behavior During Therapy: WFL for tasks assessed/performed Overall Cognitive Status: Within Functional Limits for tasks assessed    Extremity/Trunk Assessment Upper Extremity Assessment Upper Extremity Assessment: LUE deficits/detail LUE Deficits / Details: hx of CVA Lower Extremity Assessment Lower Extremity Assessment: LLE deficits/detail;Overall WFL for tasks assessed LLE Deficits / Details: L ankle df 3+/5 LLE Coordination: decreased gross motor Cervical / Trunk Assessment Cervical / Trunk Assessment: Normal   Balance Standardized Balance Assessment Standardized Balance Assessment : Berg Balance Test Berg Balance Test Sit to Stand: Able to stand without using hands and stabilize independently Standing Unsupported: Able to stand safely 2 minutes Sitting with Back Unsupported but Feet Supported on Floor or Stool: Able to sit safely and securely 2 minutes Stand to Sit: Sits safely with  minimal use of hands Transfers: Able to  transfer safely, minor use of hands Standing Unsupported with Eyes Closed: Able to stand 10 seconds with supervision Standing Ubsupported with Feet Together: Able to place feet together independently and stand for 1 minute with supervision From Standing, Reach Forward with Outstretched Arm: Can reach confidently >25 cm (10") From Standing Position, Pick up Object from Floor: Able to pick up shoe safely and easily From Standing Position, Turn to Look Behind Over each Shoulder: Looks behind from both sides and weight shifts well Turn 360 Degrees: Needs close supervision or verbal cueing Standing Unsupported, Alternately Place Feet on Step/Stool: Needs assistance to keep from falling or unable to try Standing Unsupported, One Foot in Front: Loses balance while stepping or standing Standing on One Leg: Able to lift leg independently and hold 5-10 seconds Total Score: 42  End of Session PT - End of Session Equipment Utilized During Treatment: Gait belt Activity Tolerance: Patient tolerated treatment well Patient left: in chair;with call bell/phone within reach Nurse Communication: Mobility status  GP     Amarilys Lyles LUBECK 02/28/2013, 12:16 PM

## 2013-02-28 NOTE — Progress Notes (Signed)
Pt with 11 beat run of V-tach non-sustained. Pt now A-V paced on monitor. BP 102/64, HR in 70s. Patient asymptomatic. MD on call Dr. Adolm Joseph notified and RN told to place order for BMET and Magnesium lab in AM if not already ordered. Will continue to monitor. Huel Coventry, RN

## 2013-03-01 ENCOUNTER — Inpatient Hospital Stay (HOSPITAL_COMMUNITY): Payer: Medicare Other

## 2013-03-01 DIAGNOSIS — N183 Chronic kidney disease, stage 3 unspecified: Secondary | ICD-10-CM

## 2013-03-01 DIAGNOSIS — I472 Ventricular tachycardia: Secondary | ICD-10-CM

## 2013-03-01 DIAGNOSIS — I4729 Other ventricular tachycardia: Secondary | ICD-10-CM

## 2013-03-01 LAB — PROTIME-INR
INR: 2.2 — ABNORMAL HIGH (ref 0.00–1.49)
Prothrombin Time: 23.7 seconds — ABNORMAL HIGH (ref 11.6–15.2)

## 2013-03-01 LAB — BASIC METABOLIC PANEL
BUN: 21 mg/dL (ref 6–23)
CHLORIDE: 100 meq/L (ref 96–112)
CO2: 26 mEq/L (ref 19–32)
Calcium: 8.8 mg/dL (ref 8.4–10.5)
Creatinine, Ser: 1.42 mg/dL — ABNORMAL HIGH (ref 0.50–1.35)
GFR calc Af Amer: 65 mL/min — ABNORMAL LOW (ref >90)
GFR calc non Af Amer: 56 mL/min — ABNORMAL LOW (ref >90)
GLUCOSE: 111 mg/dL — AB (ref 70–99)
Potassium: 4.4 mEq/L (ref 3.7–5.3)
Sodium: 140 mEq/L (ref 137–147)

## 2013-03-01 MED ORDER — HYDRALAZINE HCL 10 MG PO TABS: 10.0000 mg | ORAL_TABLET | Freq: Three times a day (TID) | ORAL | Status: DC

## 2013-03-01 MED ORDER — TORSEMIDE 20 MG PO TABS
20.0000 mg | ORAL_TABLET | Freq: Two times a day (BID) | ORAL | Status: DC
  Filled 2013-03-01 (×3): qty 1

## 2013-03-01 NOTE — Progress Notes (Signed)
Pt being dc to home with [facility], pt given dc instructions, medications and follow up appointments reviewed with pt and pts mom, both verbalized understanding, pt left via wheelchair, pt stable

## 2013-03-01 NOTE — Progress Notes (Signed)
Subjective:   Objective: Vital signs in last 24 hours: Temp:  [97.6 F (36.4 C)-97.9 F (36.6 C)] 97.9 F (36.6 C) (02/12 0524) Pulse Rate:  [69-80] 72 (02/12 0524) Resp:  [18] 18 (02/12 0524) BP: (92-117)/(51-84) 117/84 mmHg (02/12 0524) SpO2:  [96 %-99 %] 96 % (02/12 0524) Weight:  [286 lb 12.8 oz (130.092 kg)] 286 lb 12.8 oz (130.092 kg) (02/12 0524) Last BM Date: 02/26/13  Intake/Output from previous day: 02/11 0701 - 02/12 0700 In: 840 [P.O.:840] Out: 1750 [Urine:1750] Intake/Output this shift: Total I/O In: -  Out: 100 [Urine:100]  Medications Current Facility-Administered Medications  Medication Dose Route Frequency Provider Last Rate Last Dose  . 0.9 %  sodium chloride infusion  250 mL Intravenous PRN Lenon OmsPelbreton Balfour, MD      . acetaminophen (TYLENOL) tablet 650 mg  650 mg Oral Q4H PRN Lenon OmsPelbreton Balfour, MD   650 mg at 02/27/13 1135  . acyclovir (ZOVIRAX) tablet 400 mg  400 mg Oral Daily Lenon OmsPelbreton Balfour, MD   400 mg at 03/01/13 0948  . antiseptic oral rinse (BIOTENE) solution 15 mL  15 mL Mouth Rinse q12n4p Donato SchultzMark Skains, MD   15 mL at 02/28/13 1600  . chlorhexidine (PERIDEX) 0.12 % solution 15 mL  15 mL Mouth Rinse BID Donato SchultzMark Skains, MD   15 mL at 03/01/13 0954  . furosemide (LASIX) injection 40 mg  40 mg Intravenous BID Lenon OmsPelbreton Balfour, MD   40 mg at 03/01/13 0950  . guaiFENesin-codeine 100-10 MG/5ML solution 10 mL  10 mL Oral Q4H PRN Lenon OmsPelbreton Balfour, MD   10 mL at 02/27/13 1910  . levothyroxine (SYNTHROID, LEVOTHROID) tablet 250 mcg  250 mcg Oral QAC breakfast Lenon OmsPelbreton Balfour, MD   250 mcg at 03/01/13 0559  . losartan (COZAAR) tablet 25 mg  25 mg Oral Daily Abelino DerrickLuke K Kilroy, PA-C   25 mg at 03/01/13 [id number]  . metoprolol tartrate (LOPRESSOR) tablet 25 mg  25 mg Oral BID Laurey Moralealton S McLean, MD   25 mg at 03/01/13 0948  . ondansetron (ZOFRAN) injection 4 mg  4 mg Intravenous Q6H PRN Lenon OmsPelbreton Balfour, MD      . sodium chloride 0.9 % injection 3 mL  3 mL Intravenous  Q12H Lenon OmsPelbreton Balfour, MD   3 mL at 03/01/13 0950  . sodium chloride 0.9 % injection 3 mL  3 mL Intravenous PRN Lenon OmsPelbreton Balfour, MD      . spironolactone (ALDACTONE) tablet 12.5 mg  12.5 mg Oral Daily Lenon OmsPelbreton Balfour, MD   12.5 mg at 03/01/13 0948  . Warfarin - Pharmacist Dosing Inpatient   Does not apply q1800 Donato SchultzMark Skains, MD        PE: General appearance: alert, cooperative and no distress Lungs: clear to auscultation bilaterally Heart: regular rate and rhythm, S1, S2 normal, no murmur, click, rub or gallop Extremities: 1+ LEE Pulses: 1+ Skin: Warm and dry Neurologic: Left arm weakness.  SP CVA 4 years ago  Lab Results:   Recent Labs  02/26/13 1936  WBC 7.8  HGB 12.7*  HCT 39.8  PLT 304   BMET  Recent Labs  02/27/13 0300 02/28/13 0334 03/01/13 0710  NA 143 142 140  K 4.2 4.1 4.4  CL 103 101 100  CO2 24 27 26   GLUCOSE 119* 87 111*  BUN 16 19 21   CREATININE 1.42* 1.54* 1.42*  CALCIUM 8.6 8.5 8.8   PT/INR  Recent Labs  02/27/13 0300 02/28/13 0334 03/01/13 0710  LABPROT 18.5* 18.5* 23.7*  INR 1.59* 1.59* 2.20*    Assessment/Plan   Principal Problem:   Acute on chronic clinical systolic heart failure Active Problems:   Morbid obesity-BMI43   Hypertensive heart disease   Biventricular implantable cardioverter-defibrillator in situ   Non-ischemic cardiomyopathy   Chronic systolic heart failure   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  SP Upper GI with no evidence of hiatal hernia or gastroesophageal reflux.  AV pacing on telemetry.  He did have some wide complex idioventricular rhythm for several beats.  He also was pacing in the 30's yesterday(may have been during interrogation?)    He apparently has been complaining of a cough for over  a year.  Perhaps the losartan is causing this?  It is a SE and listed on his allergies.  Consider changing to hydralazine  FU in CHF clinic within seven days.  Changing to PO torsemide 20mg  BID.  Also on aldactone 12.5mg .  BP low to normal.      LOS: 3 days    HAGER, BRYAN 03/01/2013 9:55 AM   Agree with note written by Jones Skene PAC  Pt ready for D/C. Diuresed. ICD re programmed. NSR. Etiolo of cough still unclear. GI recommends Pulm eval as OP. Pt was seen by Dr. Catheryn Bacon and will be set up for OP F/U/   Railynn Ballo J 03/01/2013 11:01 AM

## 2013-03-01 NOTE — Plan of Care (Signed)
Problem: Phase I Progression Outcomes Goal: EF % per last Echo/documented,Core Reminder form on chart Outcome: Completed/Met Date Met:  03/01/13 20-25%(08-10-12)

## 2013-03-01 NOTE — Discharge Summary (Signed)
Physician Discharge Summary     Patient ID: Raymond [name] MRN: [id number] DOB/AGE: [birth year] 53 y.o. 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 male who presents for evaluation of increased weight. He has known hx of NICM, EF 15%, chronic systolic CHF, AFib and right brain stroke in 11/10 with residual left-sided weakness, status post CRT-D, CKD, hypothyroidism. LHC in 2008 with normal cors. Myoview 05/2010: Very mild distal anterior and apical ischemia, EF 17% (low risk-medical therapy continued). Echo 8/12: EF 20-25%. He had been on amiodarone. TSH had been noted to be very high and he was referred back to endocrinology. Synthroid was increased and he was kept on amiodarone. F/u Echo 4/14: EF 15%, diff HK, severe diast dysfn, restrictive physiology, E/medial e' > 15 suggests LVEDP at least 20 mmHg, borderline dilated Ao root, mild MR, mod LAE, mod reduced RVSF, mod TR, PASP 64.    He has had several admissions over the last 6 mos with recurrent atrial fibrillation with RVR and volume overload. He has undergone TEE-DCCV but had return of AFib.  He was last admitted 12/21-12/24 with a/c systolic CHF in the setting of AFib with RVR and loss of BiV pacing. AV nodal ablation was recommended for control of refractory  atrial fibrillation. He underwent this procedure with Dr. [name] 01/09/13. Amiodarone was discontinued. He was then readmitted 1/14-1/18 with a/c systolic CHF. He was diuresed with IV Lasix and was -7+ liters. Discharge weight was 287. He did return to the emergency room with coughing and vomiting 02/09/12.    He returned to ED with concerns for increased weight. He reports compliance with medications. He has called in to report increasing weight over the past several days. He has also had dyspnea on exertion worsening over past few days. Otherwise denies PND or orthopnea. He is up to 292 from 287 when last seen in clinic. He had no chest pain at home, but did have some on the way to ED. He had his Torsemide recently increased to 40mg  in am and 20pm and Zaroxolyn was also added with no significant improvement. Last BNP in clinic was 438, today 3952.   He was admitted and diuresed 4.5L with IV lasix.  He was placed back on torsemide at discharge along with zaroxolyn.  He has also complained of a persistent cough for about a year.  He had an UPPER GI SERIES W/HIGH DENSITY W/KUB which was normal.   GI recommended avoiding EGD for high risk and would likely be unrevealing in this setting.   They did recommend pulmonary follow up.  Losartan was discontinued due to cough and hydralazine added.  Bedside swallow evaluation completed with recommendation for barium swallow test.  Recommended regular thin liquid diet.  EP consulted and reprogrammed pacer to DDIR.  The patient was seen by Dr. Allyson Sabal who felt he was stable for DC  home.  [facility] is providing the following services: RN, OT and MSW.  Follow up in [facility] arranged.   Consults: GI, Speech and language, PT, EP,   Significant Diagnostic Studies:  UPPER GI SERIES W/HIGH DENSITY W/KUB  TECHNIQUE: After obtaining a scout radiograph a routine upper GI series was performed using thin and high density barium.  COMPARISON:  None.  FLUOROSCOPY TIME: 1 min 7 seconds  FINDINGS: The mucosa and motility of the esophagus are normal. There is no hiatal hernia or spontaneous gastroesophageal reflux. There is no visible aspiration.  The stomach, pylorus, duodenal bulb, and duodenal C-loop appear normal.  IMPRESSION: Normal upper GI. Specifically, no evidence of hiatal hernia or gastroesophageal reflux.  EXAM: CHEST 2 VIEW, 02/26/13  COMPARISON: Single view of the chest 02/08/2013 and PA and lateral chest 01/07/2013.  FINDINGS: AICD again noted. There is marked cardiomegaly. Pulmonary vascular congestion without frank edema is noted. No pneumothorax or pleural effusion.  IMPRESSION: Cardiomegaly and pulmonary vascular congestion. No acute finding.  Treatments: See above  Discharge Exam: Blood pressure 117/84, pulse 72, temperature 97.9 F (36.6 C), temperature source Oral, resp. rate 18, height 5\' 9"  (1.753 m), weight 286 lb 12.8 oz (130.092 kg), SpO2 96.00%.   Disposition: 01-Home or Self Care      Discharge Orders   Future Appointments Provider Department Dept Phone   03/08/2013 2:00 PM [facility] [phone]   04/12/2013 12:00 PM Marinus Maw, MD [facility] [phone]   Future Orders Complete By Expires   Diet - low sodium heart healthy  As directed    Increase activity slowly  As directed        Medication List    STOP taking these medications       digoxin 0.125 MG tablet  Commonly known as:  LANOXIN     losartan 50 MG tablet  Commonly known as:  COZAAR      TAKE these medications       acyclovir 800 MG tablet  Commonly known as:  ZOVIRAX  Take 400 mg by mouth daily.     hydrALAZINE 10 MG tablet  Commonly known as:  APRESOLINE  Take 1 tablet (10 mg total) by mouth 3 (three) times daily.     KLOR-CON M20 20 MEQ tablet  Generic drug:  potassium chloride SA  Take 60 mEq by mouth at bedtime.      levothyroxine 125 MCG tablet  Commonly known as:  SYNTHROID, LEVOTHROID  Take 250 mcg by mouth daily before breakfast.     metolazone 2.5 MG tablet  Commonly known as:  ZAROXOLYN  Take 2.5 mg by mouth daily.     metoprolol 50 MG tablet  Commonly known as:  LOPRESSOR  Take 2 tablets (100 mg total) by mouth 2 (two) times daily.     spironolactone 12.5 mg Tabs tablet  Commonly known as:  ALDACTONE  Take 12.5 mg by mouth daily.     torsemide 20 MG tablet  Commonly known as:  DEMADEX  Take 1 tablet (20 mg total) by mouth 2 (two) times daily.     warfarin 7.5 MG tablet  Commonly known as:  COUMADIN  Take 7.5 mg by mouth daily at 6 PM.       Follow-up Information   Follow up with CLEGG,AMY, NP On 03/08/2013. (2PM)    Specialty:  Nurse Practitioner   Contact information:   [address] [place] [address] [phone]       Follow up with [facility]. (OT)    Contact information:   [address] [place] [address] [phone]       Signed: Wilburt FinlayHAGER, Rayven Hendrickson 03/01/2013, 1:12 PM

## 2013-03-05 ENCOUNTER — Ambulatory Visit: Payer: Medicare Other | Admitting: Cardiology

## 2013-03-05 LAB — POCT INR: INR: 3.1

## 2013-03-07 ENCOUNTER — Ambulatory Visit (INDEPENDENT_AMBULATORY_CARE_PROVIDER_SITE_OTHER): Payer: Medicare Other | Admitting: Pharmacist

## 2013-03-07 ENCOUNTER — Telehealth: Payer: Self-pay | Admitting: Cardiology

## 2013-03-07 NOTE — Telephone Encounter (Signed)
New message     Talk to a nurse regarding pt can't stop coughing---he saw one of our doctors at the hosp last thurs---talk to a nurse

## 2013-03-07 NOTE — Telephone Encounter (Signed)
Pt is aware to take extra Zaroxolyn 2.5 mg today.  I spelled the name of the medication out for him.  He will call back tomorrow if no improvement.

## 2013-03-07 NOTE — Telephone Encounter (Signed)
Per pt - coughing a lot and sometimes until he almost passes out.  Cough has a clear productive cough that he reports has been worse over the past month.  No edema, wt is down 1 lb from yesterday, some post nasal drip but no s/s of a cold.  Reviewed with Dr Antoine Poche who would like for the pt to take extra Zaroxolyn 2.5 tonight.

## 2013-03-08 ENCOUNTER — Encounter (HOSPITAL_COMMUNITY): Payer: Medicare Other

## 2013-03-12 ENCOUNTER — Ambulatory Visit (INDEPENDENT_AMBULATORY_CARE_PROVIDER_SITE_OTHER): Payer: Medicare Other | Admitting: Cardiology

## 2013-03-12 LAB — POCT INR: INR: 1.8

## 2013-03-13 ENCOUNTER — Encounter (HOSPITAL_COMMUNITY): Payer: Medicare Other

## 2013-03-14 ENCOUNTER — Encounter (HOSPITAL_COMMUNITY): Payer: Self-pay

## 2013-03-14 ENCOUNTER — Ambulatory Visit (HOSPITAL_COMMUNITY)
Admission: RE | Admit: 2013-03-14 | Discharge: 2013-03-14 | Disposition: A | Discharge status: Home / Self Care | Payer: Medicare Other | Source: Ambulatory Visit | Attending: Internal Medicine | Admitting: Internal Medicine

## 2013-03-14 ENCOUNTER — Telehealth: Payer: Self-pay | Admitting: Cardiology

## 2013-03-14 VITALS — BP 121/74 | HR 70 | Resp 20 | Wt 287.1 lb

## 2013-03-14 DIAGNOSIS — N183 Chronic kidney disease, stage 3 unspecified: Secondary | ICD-10-CM

## 2013-03-14 DIAGNOSIS — N189 Chronic kidney disease, unspecified: Secondary | ICD-10-CM | POA: Insufficient documentation

## 2013-03-14 DIAGNOSIS — I5022 Chronic systolic (congestive) heart failure: Secondary | ICD-10-CM | POA: Insufficient documentation

## 2013-03-14 LAB — BASIC METABOLIC PANEL
BUN: 20 mg/dL (ref 6–23)
CO2: 27 meq/L (ref 19–32)
CREATININE: 1.58 mg/dL — AB (ref 0.50–1.35)
Calcium: 9.4 mg/dL (ref 8.4–10.5)
Chloride: 102 mEq/L (ref 96–112)
GFR calc Af Amer: 57 mL/min — ABNORMAL LOW (ref >90)
GFR calc non Af Amer: 49 mL/min — ABNORMAL LOW (ref >90)
GLUCOSE: 119 mg/dL — AB (ref 70–99)
Potassium: 4.6 mEq/L (ref 3.7–5.3)
Sodium: 141 mEq/L (ref 137–147)

## 2013-03-14 LAB — PRO B NATRIURETIC PEPTIDE: Pro B Natriuretic peptide (BNP): 3595 pg/mL — ABNORMAL HIGH (ref 0–125)

## 2013-03-14 MED ORDER — METOLAZONE 2.5 MG PO TABS: ORAL_TABLET | ORAL | Status: DC

## 2013-03-14 MED ORDER — TORSEMIDE 20 MG PO TABS: 60.0000 mg | ORAL_TABLET | Freq: Every day | ORAL | Status: DC

## 2013-03-14 MED ORDER — ISOSORBIDE MONONITRATE ER 30 MG PO TB24: 30.0000 mg | ORAL_TABLET | Freq: Every day | ORAL | Status: DC

## 2013-03-14 MED ORDER — CARVEDILOL 25 MG PO TABS: 25.0000 mg | ORAL_TABLET | Freq: Two times a day (BID) | ORAL | Status: DC

## 2013-03-14 NOTE — Progress Notes (Signed)
Patient ID: Raymond [name], male   DOB: [birth year], 53 y.o.   MRN: [id number]  PCP: [facility] Primary Cardiologist: Dr. Antoine Poche Primary EP: Dr. Graciela Husbands  HPI: Raymond [name] is a 53 y.o male with a history of NICM, chronic systolic CHF, AFib, right brain stroke in 11/2008 with residual left-sided weakness, CKD and hypothyroidism. He is s/p CRT-D.  LHC in 2008 with normal cors. Myoview 05/2010: Very mild distal anterior and apical ischemia, EF 17% (low risk-medical therapy continued). Previously on Amiodarone and had very high TSH and was referred to endocrinology. He has had several admissions over the last 6 mos with recurrent atrial fibrillation with RVR and volume overload. He has undergone TEE-DCCV but had return of AFib. He was last admitted 12/21-12/24 with a/c systolic CHF in the setting of AFib with RVR and loss of BiV pacing. AV nodal ablation was recommended for control of refractory atrial fibrillation. He underwent this procedure with Dr. [name] 01/09/13. Amiodarone was discontinued  Post Hospital F/U: Was admitted and placed on IV lasix and diuresed 4.5 liters. Discharge weight was 286 lbs. Doing ok. Reports fatigued and DOE with minimal exertion. Denies CP, PND, or edema. +orthopnea (3 pillows) Following a low salt diet. Drinks less than 2L a day. Reports taking all medications as prescribed other than synthroid.     ROS: All systems negative except as listed in HPI, PMH and Problem List.  Past Medical History  Diagnosis Date  . Systolic CHF, chronic   . Non-ischemic cardiomyopathy     a. echo 11/10: EF 15%;   b. cath 10/08: normal cors;  c. Myoview 5/12: Very mild distal ant and apical isch, EF 17% (low risk-medical Rx cont'd);  d.  Echo 4/14:  EF 15%;  e. 05/2009 s/p SJM BiV ICD;  f. 07/2012 TEE: EF 20-25%, diff HK mild MR, mod TR.   Marland Kitchen Atrial fibrillation     a. on coumadin;  b. 07/2012 s/p TEE/DCCV. c. Recurrence 08/2012 in setting of abnormal thyroid panel.; 12/2012 AVN ablation  by Dr [name]  . Hypertension   . Hypothyroidism     s/p RAI therapy  . Obesity   . Non-compliance   . Dyslexia   . 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 RBBB (right bundle branch block)   . Cough syncope     a. During 08/2012 adm.    Current Outpatient Prescriptions  Medication Sig Dispense Refill  . hydrALAZINE (APRESOLINE) 10 MG tablet Take 1 tablet (10 mg total) by mouth 3 (three) times daily.  90 tablet  5  . levothyroxine (SYNTHROID, LEVOTHROID) 125 MCG tablet Take 250 mcg by mouth daily before breakfast.      . metolazone (ZAROXOLYN) 2.5 MG tablet Take 2.5 mg by mouth daily.       . metoprolol (LOPRESSOR) 50 MG tablet Take 2 tablets (100 mg total) by mouth 2 (two) times daily.      . potassium chloride SA (KLOR-CON M20) 20 MEQ tablet Take 60 mEq by mouth at bedtime.      Marland Kitchen spironolactone (ALDACTONE) 12.5 mg TABS tablet Take 12.5 mg by mouth daily.      Marland Kitchen torsemide (DEMADEX) 20 MG tablet Take 1 tablet (20 mg total) by mouth 2 (two) times daily.  60 tablet  11  . warfarin (COUMADIN) 7.5 MG tablet Take 7.5 mg by mouth daily at 6 PM.  No current facility-administered medications for this encounter.    Filed Vitals:   03/14/13 1342  BP: 121/74  Pulse: 70  Resp: 20  Weight: 287 lb 2 oz (130.239 kg)  SpO2: 95%    PHYSICAL EXAM: General:  Chronically ill appearing. No resp difficulty; mom present HEENT: normal Neck: supple. JVP difficult to assess d/t body habitus but appears elevated. Carotids 2+ bilaterally; no bruits. No lymphadenopathy or thryomegaly appreciated. Cor: PMI normal. Regular rate & rhythm. No rubs, gallops or murmurs. Lungs: clear Abdomen: Obese; soft, nontender, nondistended. No hepatosplenomegaly. No bruits or masses. Good bowel sounds. Extremities: no cyanosis, clubbing, rash, trace edema Neuro: alert & orientedx3, cranial nerves grossly intact.    ASSESSMENT & PLAN:  1)  Chronic systolic HF: NICM s/p CRT-D, EF 20-25% (07/2012) - NYHA III/IIIb symptoms and volume status elevated. He is currently taking metolazone 2.5 mg daily and torsemide 40 mg daily. Will change metolazone to 2.5 mg on M/W/F and increase torsemide to 60 mg daily. - Will change lopressor 100 mg BID to coreg 25 mg BID. - Continue Spiro 12.5 mg daily and hydralazine 10 mg TID. Will start IMDUR 30 mg daily. - check BMET and pro-BNP today. - Lengthy discussion with patient today about the need and importance of daily weights, a low sodium diet, and fluid restriction (less than 2 L a day). Instructed to call the HF clinic if weight increases more than 3 lbs overnight or 5 lbs in a week.  - He reports he is tired of having to urinate all the time and would like to look into condom caths. Will provide prescription for patient for condom cath at next visit and he can get filled at [facility] supply store. Will also refer to SW next visit for help with finding PCP. 2) Afib - Appears to be in NSR today. Continue Coumadin.  3) CKD, stage II - Will draw BMET today.   F/U 2 weeks Aundria RudCosgrove, Quaneisha Hanisch B 10:21 PM  Addendum: After patient's appointment called his pharmacy to verify medications and was told pt has not filled metolazone since June and that he is not compliant with getting meds filled. Called patient and he reported that he hasn't been taking what was writted on d/c summary because he could not afford medications and doesn't like going to the bathroom all the time. He reported no quality of life and wanted to "die." Am very concerned that its going to be hard to keep out of hospital if not taking medications as prescribed. Told we could help with HF meds, however he says he can get all the 4$ medications and all the ones I sent today were 4$. He may need help with torsemide.

## 2013-03-14 NOTE — Patient Instructions (Signed)
Stop taking lopressor.  Start taking Coreg 25 mg twice a day.  Change your metolazone to 2.5 mg (1 tablet) on Monday, Wednesday and Friday.  Increase your torsemide to 60 mg (3 tablets) once a day.  Start IMDUR 30 mg (1 tablet) daily.  F/U 2 weeks and bring all your medication bottles with you.  Do the following things EVERYDAY: 1) Weigh yourself in the morning before breakfast. Write it down and keep it in a log. 2) Take your medicines as prescribed 3) Eat low salt foods-Limit salt (sodium) to 2000 mg per day.  4) Stay as active as you can everyday 5) Limit all fluids for the day to less than 2 liters 6)

## 2013-03-14 NOTE — Telephone Encounter (Signed)
Dr Antoine Poche would have this paperwork with him to be signed.  I do not have access to it to see the specific order numbers.  Will review on Monday.

## 2013-03-14 NOTE — Telephone Encounter (Signed)
New message         Pt would like to know if you received the home health interim report and the plan of care forms by fax. Order numbers are [id number], [id number], and [id number]. [id number] is the order number for the plan of care. Please call Lillia Abed back.

## 2013-03-22 ENCOUNTER — Ambulatory Visit (INDEPENDENT_AMBULATORY_CARE_PROVIDER_SITE_OTHER): Payer: Medicare Other | Admitting: Internal Medicine

## 2013-03-22 LAB — POCT INR: INR: 2

## 2013-04-02 ENCOUNTER — Ambulatory Visit (HOSPITAL_COMMUNITY)
Admission: RE | Admit: 2013-04-02 | Discharge: 2013-04-02 | Disposition: A | Discharge status: Home / Self Care | Payer: Medicare Other | Source: Ambulatory Visit | Attending: Internal Medicine | Admitting: Internal Medicine

## 2013-04-02 ENCOUNTER — Encounter (HOSPITAL_COMMUNITY): Payer: Self-pay

## 2013-04-02 VITALS — BP 124/74 | HR 70 | Resp 22 | Wt 292.2 lb

## 2013-04-02 DIAGNOSIS — I5022 Chronic systolic (congestive) heart failure: Secondary | ICD-10-CM | POA: Insufficient documentation

## 2013-04-02 DIAGNOSIS — I129 Hypertensive chronic kidney disease with stage 1 through stage 4 chronic kidney disease, or unspecified chronic kidney disease: Secondary | ICD-10-CM | POA: Insufficient documentation

## 2013-04-02 DIAGNOSIS — N183 Chronic kidney disease, stage 3 unspecified: Secondary | ICD-10-CM | POA: Insufficient documentation

## 2013-04-02 DIAGNOSIS — I480 Paroxysmal atrial fibrillation: Secondary | ICD-10-CM

## 2013-04-02 DIAGNOSIS — I4891 Unspecified atrial fibrillation: Secondary | ICD-10-CM | POA: Insufficient documentation

## 2013-04-02 DIAGNOSIS — I10 Essential (primary) hypertension: Secondary | ICD-10-CM

## 2013-04-02 LAB — BASIC METABOLIC PANEL
BUN: 16 mg/dL (ref 6–23)
CO2: 27 mEq/L (ref 19–32)
CREATININE: 1.52 mg/dL — AB (ref 0.50–1.35)
Calcium: 9 mg/dL (ref 8.4–10.5)
Chloride: 102 mEq/L (ref 96–112)
GFR calc Af Amer: 60 mL/min — ABNORMAL LOW (ref >90)
GFR calc non Af Amer: 52 mL/min — ABNORMAL LOW (ref >90)
GLUCOSE: 102 mg/dL — AB (ref 70–99)
Potassium: 4.1 mEq/L (ref 3.7–5.3)
Sodium: 142 mEq/L (ref 137–147)

## 2013-04-02 LAB — PRO B NATRIURETIC PEPTIDE: Pro B Natriuretic peptide (BNP): 2869 pg/mL — ABNORMAL HIGH (ref 0–125)

## 2013-04-02 MED ORDER — CARVEDILOL 25 MG PO TABS: 12.5000 mg | ORAL_TABLET | Freq: Two times a day (BID) | ORAL | Status: DC

## 2013-04-02 NOTE — Patient Instructions (Signed)
Cut your coreg back to 12.5 mg (1/2 tablet) twice a day.  Take medications as prescribed and weigh yourself daily.  Follow up 3 weeks with ECHO.  Do the following things EVERYDAY: 1) Weigh yourself in the morning before breakfast. Write it down and keep it in a log. 2) Take your medicines as prescribed 3) Eat low salt foods-Limit salt (sodium) to 2000 mg per day.  4) Stay as active as you can everyday 5) Limit all fluids for the day to less than 2 liters 6)

## 2013-04-02 NOTE — Progress Notes (Signed)
Patient ID: Raymond [name], male   DOB: 1960-06-03, 53 y.o.   MRN: [id number]  PCP: [facility] Primary Cardiologist: Dr. Antoine PocheHochrein Primary EP: Dr. 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 He is s/p CRT-D.  LHC in 2008 with normal cors. Myoview 05/2010: Very mild distal anterior and apical ischemia, EF 17% (low risk-medical therapy continued). Previously on Amiodarone and had very high TSH and was referred to endocrinology. He has had several admissions over the last 6 mos with recurrent atrial fibrillation with RVR and volume overload. He has undergone TEE-DCCV but had return of AFib. He was last admitted 12/21-12/24 with a/c systolic CHF in the setting of AFib with RVR and loss of BiV pacing. AV nodal ablation was recommended for control of refractory atrial fibrillation. He underwent this procedure with Dr. Ladona Ridgelaylor 01/09/13. Amiodarone was discontinued  Follow up: Last visit increase torsemide to 60 mg daily and metolazone to 2.5 mg M/W/F. We also changed lopressor to coreg 25 mg BID and started IMDUR 30 mg daily. Feeling overall ok. Picked up medications and reports taking them most of the time as prescribed. Not weighing daily. +DOE with minimal exertion. Denies CP, PND, or edema. +orthopnea (3 pillows). Following a low salt diet. Drinks less than 2L a day.    ROS: All systems negative except as listed in HPI, PMH and Problem List.  Past Medical History  Diagnosis Date  . Systolic CHF, chronic   . Non-ischemic cardiomyopathy     a. echo 11/10: EF 15%;   b. cath 10/08: normal cors;  c. Myoview 5/12: Very mild distal ant and apical isch, EF 17% (low risk-medical Rx cont'd);  d.  Echo 4/14:  EF 15%;  e. 05/2009 s/p SJM BiV ICD;  f. 07/2012 TEE: EF 20-25%, diff HK mild MR, mod TR.   Marland Kitchen. Atrial fibrillation     a. on coumadin;  b. 07/2012 s/p TEE/DCCV. c.  Recurrence 08/2012 in setting of abnormal thyroid panel.; 12/2012 AVN ablation by Dr Ladona Ridgelaylor  . Hypertension   . Hypothyroidism     s/p RAI therapy  . Obesity   . Non-compliance   . Dyslexia   . 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 RBBB (right bundle branch block)   . Cough syncope     a. During 08/2012 adm.    Current Outpatient Prescriptions  Medication Sig Dispense Refill  . acyclovir (ZOVIRAX) 800 MG tablet Take 400 mg by mouth 2 (two) times daily. Take 1/2 tablet twice a day      . carvedilol (COREG) 25 MG tablet Take 1 tablet (25 mg total) by mouth 2 (two) times daily with a meal.  60 tablet  3  . hydrALAZINE (APRESOLINE) 10 MG tablet Take 1 tablet (10 mg total) by mouth 3 (three) times daily.  90 tablet  5  . levothyroxine (SYNTHROID, LEVOTHROID) 125 MCG tablet Take 250 mcg by mouth daily before breakfast.      . losartan (COZAAR) 25 MG tablet Take 25 mg by mouth daily.      . metolazone (ZAROXOLYN) 2.5 MG tablet Take 2.5 mg on Monday, Wednesday and Friday or as needed.  15 tablet  3  . potassium chloride SA (KLOR-CON M20) 20 MEQ tablet Take 60 mEq by  mouth at bedtime.      Marland Kitchen spironolactone (ALDACTONE) 12.5 mg TABS tablet Take 12.5 mg by mouth daily.      Marland Kitchen torsemide (DEMADEX) 20 MG tablet Take 3 tablets (60 mg total) by mouth daily.  90 tablet  3  . warfarin (COUMADIN) 7.5 MG tablet Take 7.5 mg by mouth daily at 6 PM.       No current facility-administered medications for this encounter.    Filed Vitals:   04/02/13 1350  BP: 124/74  Pulse: 70  Resp: 22  Weight: 292 lb 4 oz (132.564 kg)  SpO2: 98%    PHYSICAL EXAM: General:  Chronically ill appearing. No resp difficulty; mom present HEENT: normal Neck: supple. JVP difficult to assess d/t body habitus but appears 8 Carotids 2+ bilaterally; no bruits. No lymphadenopathy or thryomegaly appreciated. Cor: PMI normal. Regular rate & rhythm. No rubs,  gallops or murmurs. Lungs: clear Abdomen: Obese; soft, nontender, nondistended. No hepatosplenomegaly. No bruits or masses. Good bowel sounds. Extremities: no cyanosis, clubbing, rash, trace edema Neuro: alert & orientedx3, cranial nerves grossly intact.    ASSESSMENT & PLAN:  1) Chronic systolic HF: NICM s/p CRT-D, EF 20-25% (07/2012) - NYHA III/IIIb symptoms and volume status slightly elevated. He does not take his medications as prescribed, has difficulty taking fluid pills when he is out and about. Have asked him to try and take medications as prescribed. He is also not weighing daily and have asked him to please weigh daily. Volume status is slightly elevated.  - At goal dose coreg 25 mg BID, however reports feeling real fatigued since change. Will cut back to 12.5 mg BID. - Continue Spiro 12.5 mg daily, hydralazine 10 mg TID and IMDUR 30 mg daily.  - check BMET and pro-BNP today. If Cr stable will increase spiro to 25 mg daily.  - Lengthy discussion with patient today about the need and importance of daily weights, a low sodium diet, and fluid restriction (less than 2 L a day). Instructed to call the HF clinic if weight increases more than 3 lbs overnight or 5 lbs in a week.  - Will refer to SW for help with finding PCP. 2) Afib - Appears to be in NSR today. Continue Coumadin. Asked to follow up with Coumadin clinic. Followed by Dr. Antoine Poche.  3) CKD, stage II - Will draw BMET today.  4) HTN - Stable. Continue current medications.   F/U 3 weeks with ECHO. Ulla Potash B NP-C 2:01 PM

## 2013-04-02 NOTE — Telephone Encounter (Signed)
New message    Need an order to order coumadin machine. Home health advise MD approves this patient can get this free from his insurance.

## 2013-04-03 ENCOUNTER — Telehealth: Payer: Self-pay | Admitting: Licensed Clinical Social Worker

## 2013-04-03 NOTE — Telephone Encounter (Signed)
CSW referred to assist patient with obtaining PCP. CSW contacted patient by phone to follow up on referral. Patient reports that he has Medicare and receives SSD income. Patient seemed unsure about Medicare D and states that he does not have any prescription plan. CSW provided the contact information for [facility] to inquire about Medicare D plans. CSW also provided PCP options of [facility]  and [facility]. Patient states he will follow up with PCP for appointment and return call to CSW if further needs arise. Lasandra Beech, LCSW [phone]

## 2013-04-04 ENCOUNTER — Ambulatory Visit (INDEPENDENT_AMBULATORY_CARE_PROVIDER_SITE_OTHER): Payer: Medicare Other | Admitting: Pharmacist

## 2013-04-04 LAB — POCT INR: INR: 1.2

## 2013-04-05 NOTE — Progress Notes (Signed)
Riki Rusk.  Please add return visit date. Thanks.   Won't let me close the note.   Loraine Leriche

## 2013-04-10 NOTE — Telephone Encounter (Signed)
Pt advised will forward to Dr Antoine Poche for further review.

## 2013-04-10 NOTE — Telephone Encounter (Signed)
Follow up    Pt want to get his own coumadin machine.  He has not heard from anyone regarding his last call.  Please call

## 2013-04-11 ENCOUNTER — Other Ambulatory Visit: Payer: Self-pay

## 2013-04-11 NOTE — Telephone Encounter (Signed)
Left message for Lillia Abed that I spoke with Alfonse Ras in coumadin clinic and he will start paperwork to see if they can get this covered.  Spoke with pt who is aware as well.  Pt has appt tomorrow to here to have PT/INR checked and will come for that.

## 2013-04-12 ENCOUNTER — Encounter: Payer: Self-pay | Admitting: Internal Medicine

## 2013-04-12 ENCOUNTER — Ambulatory Visit (INDEPENDENT_AMBULATORY_CARE_PROVIDER_SITE_OTHER): Payer: Medicare Other | Admitting: *Deleted

## 2013-04-12 ENCOUNTER — Ambulatory Visit (INDEPENDENT_AMBULATORY_CARE_PROVIDER_SITE_OTHER): Payer: Medicare Other | Admitting: Internal Medicine

## 2013-04-12 VITALS — BP 122/90 | HR 69 | Ht 69.0 in | Wt 283.0 lb

## 2013-04-12 DIAGNOSIS — I428 Other cardiomyopathies: Secondary | ICD-10-CM

## 2013-04-12 DIAGNOSIS — Z5181 Encounter for therapeutic drug level monitoring: Secondary | ICD-10-CM | POA: Insufficient documentation

## 2013-04-12 DIAGNOSIS — I4891 Unspecified atrial fibrillation: Secondary | ICD-10-CM

## 2013-04-12 DIAGNOSIS — I5022 Chronic systolic (congestive) heart failure: Secondary | ICD-10-CM

## 2013-04-12 DIAGNOSIS — Z95810 Presence of automatic (implantable) cardiac defibrillator: Secondary | ICD-10-CM

## 2013-04-12 DIAGNOSIS — I10 Essential (primary) hypertension: Secondary | ICD-10-CM

## 2013-04-12 LAB — MDC_IDC_ENUM_SESS_TYPE_INCLINIC
Battery Remaining Longevity: 39.6 mo
Brady Statistic RA Percent Paced: 41 %
HighPow Impedance: 55 Ohm
Lead Channel Impedance Value: 425 Ohm
Lead Channel Pacing Threshold Amplitude: 0.5 V
Lead Channel Pacing Threshold Amplitude: 0.75 V
Lead Channel Pacing Threshold Amplitude: 0.75 V
Lead Channel Pacing Threshold Amplitude: 0.75 V
Lead Channel Pacing Threshold Pulse Width: 0.5 ms
Lead Channel Pacing Threshold Pulse Width: 0.5 ms
Lead Channel Pacing Threshold Pulse Width: 0.5 ms
Lead Channel Pacing Threshold Pulse Width: 0.5 ms
Lead Channel Pacing Threshold Pulse Width: 0.5 ms
Lead Channel Sensing Intrinsic Amplitude: 12 mV
Lead Channel Sensing Intrinsic Amplitude: 5 mV
Lead Channel Setting Pacing Amplitude: 2 V
Lead Channel Setting Pacing Amplitude: 2 V
Lead Channel Setting Pacing Amplitude: 2.5 V
Lead Channel Setting Pacing Pulse Width: 0.5 ms
Lead Channel Setting Pacing Pulse Width: 0.5 ms
Lead Channel Setting Sensing Sensitivity: 0.5 mV
MDC IDC MSMT LEADCHNL LV IMPEDANCE VALUE: 737.5 Ohm
MDC IDC MSMT LEADCHNL LV PACING THRESHOLD AMPLITUDE: 0.75 V
MDC IDC MSMT LEADCHNL LV PACING THRESHOLD PULSEWIDTH: 0.5 ms
MDC IDC MSMT LEADCHNL RV IMPEDANCE VALUE: 487.5 Ohm
MDC IDC MSMT LEADCHNL RV PACING THRESHOLD AMPLITUDE: 0.5 V
MDC IDC PG SERIAL: 610510
MDC IDC SESS DTM: 20150326124915
MDC IDC SET ZONE DETECTION INTERVAL: 250 ms
MDC IDC SET ZONE DETECTION INTERVAL: 280 ms
MDC IDC STAT BRADY RV PERCENT PACED: 95 %
Zone Setting Detection Interval: 455 ms

## 2013-04-12 LAB — POCT INR: INR: 1.5

## 2013-04-12 NOTE — Progress Notes (Signed)
HPI [name] returns today for followup. He is a pleasant 53 yo man with a h/o an DCM, chronic class 3 systolic CHF, persistent atrial fibrillation with an uncontrolled VR, who underwent AV node ablation 3 months ago. The patient has had 2 hospitalizations for CHF in the past 3 months. He has had problems with sodium indiscretion and medical non-compliance. Today he took none of his meds because of not wanting to need to urinate. He states that he will take his diuretics tomorrow. He has tried to reduce his sodium intake recently.   Allergies  Allergen Reactions  . Losartan Cough  . Valsartan Cough     Current Outpatient Prescriptions  Medication Sig Dispense Refill  . acyclovir (ZOVIRAX) 800 MG tablet Take 400 mg by mouth 2 (two) times daily. Take 1/2 tablet twice a day      . carvedilol (COREG) 25 MG tablet Take 0.5 tablets (12.5 mg total) by mouth 2 (two) times daily with a meal.  30 tablet  3  . hydrALAZINE (APRESOLINE) 10 MG tablet Take 1 tablet (10 mg total) by mouth 3 (three) times daily.  90 tablet  5  . levothyroxine (SYNTHROID, LEVOTHROID) 125 MCG tablet Take 250 mcg by mouth daily before breakfast.      . losartan (COZAAR) 25 MG tablet Take 25 mg by mouth daily.      . metolazone (ZAROXOLYN) 2.5 MG tablet Take 2.5 mg on Monday, Wednesday and Friday or as needed.  15 tablet  3  . potassium chloride SA (KLOR-CON M20) 20 MEQ tablet Take 60 mEq by mouth at bedtime.      Marland Kitchen. spironolactone (ALDACTONE) 12.5 mg TABS tablet Take 12.5 mg by mouth daily.      Marland Kitchen. torsemide (DEMADEX) 20 MG tablet Take 3 tablets (60 mg total) by mouth daily.  90 tablet  3  . warfarin (COUMADIN) 7.5 MG tablet Take 7.5 mg by mouth daily at 6 PM.       No current facility-administered medications for this visit.     Past Medical History  Diagnosis Date  . Systolic CHF, chronic   . Non-ischemic cardiomyopathy     a. echo 11/10: EF 15%;   b. cath 10/08: normal cors;  c. Myoview 5/12: Very mild distal  ant and apical isch, EF 17% (low risk-medical Rx cont'd);  d.  Echo 4/14:  EF 15%;  e. 05/2009 s/p SJM BiV ICD;  f. 07/2012 TEE: EF 20-25%, diff HK mild MR, mod TR.   Marland Kitchen. Atrial fibrillation     a. on coumadin;  b. 07/2012 s/p TEE/DCCV. c. Recurrence 08/2012 in setting of abnormal thyroid panel.; 12/2012 AVN ablation by Dr Ladona Ridgelaylor  . Hypertension   . Hypothyroidism     s/p RAI therapy  . Obesity   . Non-compliance   . Dyslexia   . 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 RBBB (right bundle branch block)   . Cough syncope     a. During 08/2012 adm.    ROS:   All systems reviewed and negative except as noted in the HPI.   Past Surgical History  Procedure Laterality Date  . Bi-ventricular implantable cardioverter defibrillator  (crt-d)  06/11/09    [employer] [id number] BIVENTRICULAR AICD SERIAL #[id number]  . Knee arthroscopy Left ~ 1995  . Cardiac catheterization      "several time" (09/01/2012)  .  Tee without cardioversion N/A 06/23/2012    Procedure: TRANSESOPHAGEAL ECHOCARDIOGRAM (TEE);  Surgeon: Vesta Mixer, MD;  Location: [facility] ENDOSCOPY;  Service: Cardiovascular;  Laterality: N/A;  TEE will be done at 0730   . Cardioversion N/A 07/27/2012    Procedure: CARDIOVERSION;  Surgeon: Vesta Mixer, MD;  Location: [facility] ENDOSCOPY;  Service: Cardiovascular;  Laterality: N/A;  . Tee without cardioversion N/A 08/10/2012    Procedure: TRANSESOPHAGEAL ECHOCARDIOGRAM (TEE);  Surgeon: Wendall Stade, MD;  Location: [facility] ENDOSCOPY;  Service: Cardiovascular;  Laterality: N/A;  . Cardioversion N/A 08/10/2012    Procedure: CARDIOVERSION;  Surgeon: Wendall Stade, MD;  Location: [facility] ENDOSCOPY;  Service: Cardiovascular;  Laterality: N/A;  . Sp pta add intra cran  11/2008    a. 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 Ablation  01/09/2013    AVN ablation by Dr [name]     Family History  Problem Relation  Age of Onset  . Heart disease Father      History   Social History  . Marital Status: Legally Separated    Spouse Name: N/A    Number of Children: 0  . Years of Education: N/A   Occupational History  . DISABLED    Social History Main Topics  . Smoking status: Never Smoker   . Smokeless tobacco: Never Used  . Alcohol Use: No  . Drug Use: No  . Sexual Activity: Not Currently   Other Topics Concern  . Not on file   Social History Narrative   Lives alone in [place].  Disabled from CVA.      BP 122/90  Pulse 69  Ht 5\' 9"  (1.753 m)  Wt 283 lb (128.368 kg)  BMI 41.77 kg/m2  Physical Exam:  Well appearing NAD HEENT: Unremarkable Neck:  No JVD, no thyromegally Lymphatics:  No adenopathy Back:  No CVA tenderness Lungs:  Clear HEART:  Regular rate rhythm, no murmurs, no rubs, no clicks Abd:  soft, positive bowel sounds, no organomegally, no rebound, no guarding Ext:  2 plus pulses, no edema, no cyanosis, no clubbing Skin:  No rashes no nodules Neuro:  CN II through XII intact, motor grossly intact  EKG  DEVICE  Normal device function.  See PaceArt for details.   Assess/Plan:

## 2013-04-12 NOTE — Patient Instructions (Signed)
Your physician wants you to follow-up in: 6 months with Dr Court Joy will receive a reminder letter in the mail two months in advance. If you don't receive a letter, please call our office to schedule the follow-up appointment.   Remote monitoring is used to monitor your Pacemaker of ICD from home. This monitoring reduces the number of office visits required to check your device to one time per year. It allows [place] to keep an eye on the functioning of your device to ensure it is working properly. You are scheduled for a device check from home on 07/16/13. You may send your transmission at any time that day. If you have a wireless device, the transmission will be sent automatically. After your physician reviews your transmission, you will receive a postcard with your next transmission date.

## 2013-04-16 ENCOUNTER — Ambulatory Visit: Payer: Medicare Other | Admitting: Family Medicine

## 2013-04-16 ENCOUNTER — Encounter: Payer: Self-pay | Admitting: Family Medicine

## 2013-04-16 ENCOUNTER — Ambulatory Visit (INDEPENDENT_AMBULATORY_CARE_PROVIDER_SITE_OTHER): Payer: Medicare Other | Admitting: Family Medicine

## 2013-04-16 VITALS — BP 109/77 | HR 70 | Temp 97.6°F | Resp 16 | Ht 69.0 in | Wt 282.0 lb

## 2013-04-16 DIAGNOSIS — I5023 Acute on chronic systolic (congestive) heart failure: Secondary | ICD-10-CM

## 2013-04-16 DIAGNOSIS — Z7901 Long term (current) use of anticoagulants: Secondary | ICD-10-CM

## 2013-04-16 DIAGNOSIS — E039 Hypothyroidism, unspecified: Secondary | ICD-10-CM

## 2013-04-16 DIAGNOSIS — N183 Chronic kidney disease, stage 3 unspecified: Secondary | ICD-10-CM

## 2013-04-16 DIAGNOSIS — I428 Other cardiomyopathies: Secondary | ICD-10-CM

## 2013-04-16 DIAGNOSIS — I4891 Unspecified atrial fibrillation: Secondary | ICD-10-CM

## 2013-04-16 DIAGNOSIS — Z5181 Encounter for therapeutic drug level monitoring: Secondary | ICD-10-CM

## 2013-04-16 DIAGNOSIS — G4700 Insomnia, unspecified: Secondary | ICD-10-CM

## 2013-04-16 MED ORDER — ALPRAZOLAM 0.25 MG PO TABS: 0.2500 mg | ORAL_TABLET | Freq: Every evening | ORAL | Status: DC | PRN

## 2013-04-16 NOTE — Progress Notes (Signed)
Subjective:    Patient ID: Raymond [name], male    DOB: 09/14/1960, 53 y.o.   MRN: [id number]  04/16/2013  Establish Care and Medication Refill   HPI This 53 y.o. male presents with mother to establish care and for one year follow-up:  1.  Hypothyroidism:  Maintained on same Synthroid.  Patient reports good compliance with medication, good tolerance to medication, and good symptom control.    2.  CHF:  Followed by cardiology every 3-6 months.  Scheduled for echo this month.  Has defibrillator.  Multiple admissions this year; known non-compliance.  Girlfriend prepares pill box weekly; pt not really familiar with medications.  Denies CP/palp/SOB/leg swelling. Does not weigh daily but knows that he should be weighing daily. Compliance with diuretic.  3. CVA: suffered CVA in 2010; disability due to CVA.  Mother reports that patient very irritable and mean since CVA.  S/p tPA and stenting.  4.  Atrial fibrillation: maintained on Coumadin, Carvedilol.  S/p AVN ablation by [name].  5. Health Maintenance:   Last physical unknown. Colonoscopy 2014 during admission for GI bleed? Tetanus vaccine unknown. Pneumonax 2015. Influenza vaccine 12/2012. Eye exam never; no glasses.  +readers. Dental exam several years ago.    6. Anxiety:  Takes Xanax twice weekly on average for insomnia.  Needs refill.  As above, mother reports that patient is very mean to girlfriend. Before CVA, pt was a workaholic; now unable to work and very irritable.    Review of Systems  Constitutional: Negative for fever, chills, diaphoresis and fatigue.  Respiratory: Negative for shortness of breath and stridor.   Cardiovascular: Negative for chest pain, palpitations and leg swelling.  Gastrointestinal: Negative for nausea, vomiting, abdominal pain and diarrhea.  Endocrine: Negative for cold intolerance, heat intolerance, polydipsia, polyphagia and polyuria.  Neurological: Negative for dizziness, seizures, syncope, facial  asymmetry, speech difficulty, weakness, light-headedness and headaches.  Psychiatric/Behavioral: Positive for sleep disturbance. Negative for dysphoric mood. The patient is nervous/anxious.     Past Medical History  Diagnosis Date  . Non-ischemic cardiomyopathy     a. echo 11/10: EF 15%;   b. cath 10/08: normal cors;  c. Myoview 5/12: Very mild distal ant and apical isch, EF 17% (low risk-medical Rx cont'd);  d.  Echo 4/14:  EF 15%;  e. 05/2009 s/p SJM BiV ICD;  f. 07/2012 TEE: EF 20-25%, diff HK mild MR, mod TR.   Marland Kitchen Atrial fibrillation     a. on coumadin;  b. 07/2012 s/p TEE/DCCV. c. Recurrence 08/2012 in setting of abnormal thyroid panel.; 12/2012 AVN ablation by Dr [name]  . Hypertension   . Obesity   . Non-compliance   . Dyslexia   . 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 RBBB (right bundle branch block)   . Cough syncope     a. During 08/2012 adm.  . Systolic CHF, chronic 01/19/1996  . Hypothyroidism 01/19/1996    s/p RAI therapy  . Stroke 01/19/2008   Allergies  Allergen Reactions  . Losartan Cough  . Valsartan Cough   Current Outpatient Prescriptions  Medication Sig Dispense Refill  . acyclovir (ZOVIRAX) 800 MG tablet Take 400 mg by mouth 2 (two) times daily. Take 1/2 tablet twice a day      . carvedilol (COREG) 25 MG tablet Take 0.5 tablets (12.5 mg total) by mouth 2 (two) times daily with a meal.  30 tablet  3  .  hydrALAZINE (APRESOLINE) 10 MG tablet Take 1 tablet (10 mg total) by mouth 3 (three) times daily.  90 tablet  5  . levothyroxine (SYNTHROID, LEVOTHROID) 125 MCG tablet Take 250 mcg by mouth daily before breakfast.      . losartan (COZAAR) 25 MG tablet Take 25 mg by mouth daily.      . metolazone (ZAROXOLYN) 2.5 MG tablet Take 2.5 mg on Monday, Wednesday and Friday or as needed.  15 tablet  3  . potassium chloride SA (KLOR-CON M20) 20 MEQ tablet Take 60 mEq by mouth at bedtime.      Marland Kitchen spironolactone  (ALDACTONE) 12.5 mg TABS tablet Take 12.5 mg by mouth daily.      Marland Kitchen torsemide (DEMADEX) 20 MG tablet Take 3 tablets (60 mg total) by mouth daily.  90 tablet  3  . warfarin (COUMADIN) 7.5 MG tablet Take 7.5 mg by mouth daily at 6 PM.      . ALPRAZolam (XANAX) 0.25 MG tablet Take 1 tablet (0.25 mg total) by mouth at bedtime as needed for anxiety.  30 tablet  2   No current facility-administered medications for this visit.   History   Social History  . Marital Status: Legally Separated    Spouse Name: N/A    Number of Children: 0  . Years of Education: N/A   Occupational History  . DISABLED    Social History Main Topics  . Smoking status: Never Smoker   . Smokeless tobacco: Never Used  . Alcohol Use: No  . Drug Use: No  . Sexual Activity: Not Currently   Other Topics Concern  . Not on file   Social History Narrative   Marital status: divorced since 2014; married x 16 years; dating.      Children: none      Lives: Lives alone in [place].        Employment:  Disabled from CVA.      Tobacco: none      Alcohol: rarely      Exercise:  Sporadically.      ADLs: drives; independent with ADLs; girlfriend does grocery shopping.      Seatbelt: 100%       Family History  Problem Relation Age of Onset  . Heart disease Father 76    AMI  . Diabetes Father   . Diabetes Sister   . Atrial fibrillation Sister        Objective:    BP 109/77  Pulse 70  Temp(Src) 97.6 F (36.4 C)  Resp 16  Ht 5\' 9"  (1.753 m)  Wt 282 lb (127.914 kg)  BMI 41.63 kg/m2 Physical Exam  Nursing note and vitals reviewed. Constitutional: He is oriented to person, place, and time. He appears well-developed and well-nourished. No distress.  HENT:  Head: Normocephalic and atraumatic.  Right Ear: External ear normal.  Left Ear: External ear normal.  Nose: Nose normal.  Mouth/Throat: Oropharynx is clear and moist.  Eyes: Conjunctivae and EOM are normal. Pupils are equal, round, and reactive to light.    Neck: Normal range of motion. Neck supple. Carotid bruit is not present. No thyromegaly present.  Cardiovascular: Normal rate, regular rhythm and normal heart sounds.  Exam reveals no gallop and no friction rub.   No murmur heard. Pulmonary/Chest: Effort normal and breath sounds normal. He has no wheezes. He has no rales.  Abdominal: Soft. Bowel sounds are normal. He exhibits no distension and no mass. There is no tenderness. There is no rebound  and no guarding.  Lymphadenopathy:    He has no cervical adenopathy.  Neurological: He is alert and oriented to person, place, and time. No cranial nerve deficit.  Skin: Skin is warm and dry. No rash noted. He is not diaphoretic.  Psychiatric: He has a normal mood and affect. His behavior is normal.   Results for orders placed in visit on 04/16/13  TSH      Result Value Ref Range   TSH 5.303 (*) 0.350 - 4.500 uIU/mL  T4, FREE      Result Value Ref Range   Free T4 1.63  0.80 - 1.80 ng/dL       Assessment & Plan:  Obesity, Class III, BMI 40-49.9 (morbid obesity)  Hypothyroidism - Plan: TSH, T4, free  Insomnia  Acute on chronic clinical systolic heart failure  Hypothyroid-elevated TSH on Amiodarone  CKD (chronic kidney disease), stage III  1. Hypothyroidism: stable; obtain labs; refill provided. 2.  Insomnia: intermittent; suspect underlying anxiety/depression since CVA; refill of Xanax provided for sparing use. Investigate further at future appointments; likely will warrant SSRI for depression/anxiety. 3.  CHF systolic: stable; euvolemic in office today; s/p defibrillator placement; followed closely by cardiology; multiple admissions in past year. 4.  Atrial fibrillation: stable; s/p AVN ablation by Ladona Ridgelaylor in 12/2012.  Maintained on Coumadin per cardiology.   5.  CKD: stable; follow closely. Recent Creatinine of 1.52 on 04/02/13. 6. Non-compliance: pt not familiar with medications during visit;mother present during visit.  Girlfriend  prepares medications in pill box weekly; schedule regular follow-up appointments.  Meds ordered this encounter  Medications  . ALPRAZolam (XANAX) 0.25 MG tablet    Sig: Take 1 tablet (0.25 mg total) by mouth at bedtime as needed for anxiety.    Dispense:  30 tablet    Refill:  2    Return in about 3 months (around 07/17/2013) for complete physical examiniation.   Nilda SimmerKristi Danaria Larsen, M.D.  [facility]  [place] [address] [place], [place]  [id number] [phone] phone [phone] fax

## 2013-04-17 LAB — T4, FREE: Free T4: 1.63 ng/dL (ref 0.80–1.80)

## 2013-04-17 LAB — TSH: TSH: 5.303 u[IU]/mL — ABNORMAL HIGH (ref 0.350–4.500)

## 2013-04-19 ENCOUNTER — Ambulatory Visit (INDEPENDENT_AMBULATORY_CARE_PROVIDER_SITE_OTHER): Payer: Medicare Other | Admitting: *Deleted

## 2013-04-19 ENCOUNTER — Encounter: Payer: Self-pay | Admitting: Internal Medicine

## 2013-04-19 DIAGNOSIS — I4891 Unspecified atrial fibrillation: Secondary | ICD-10-CM

## 2013-04-19 DIAGNOSIS — Z5181 Encounter for therapeutic drug level monitoring: Secondary | ICD-10-CM

## 2013-04-19 LAB — POCT INR: INR: 2.8

## 2013-04-20 ENCOUNTER — Telehealth: Payer: Self-pay | Admitting: Cardiology

## 2013-04-20 NOTE — Telephone Encounter (Signed)
New message     Patient calling checking on the status of PT/ INR meter.

## 2013-04-23 NOTE — Telephone Encounter (Signed)
Patient stated that he wanted to adjust his own dose based on measurement if he got a machine.  We explained that the coumadin clinic doesn't adjust warfarin doses for patients with a home monitor, and that his cardiologist would have to adjust warfarin doses if they wanted patient to have a home monitor.  Patient needs to either continue to come here for protimes, get this checked by his PCP (one lives ~ 1 mile away per patient), or have Dr. Antoine Poche manage his warfarin if Dr. Antoine Poche wants him to have a home monitor.    Rinaldo Cloud, ask Dr. Antoine Poche if he is willing to manage [name] protimes himself, and if he isn't able to do this, the patient will need to continue to go to physicians office to have this done (here or his PCP).  Thanks.

## 2013-04-23 NOTE — Telephone Encounter (Signed)
We do not manage home warfarin.

## 2013-04-25 ENCOUNTER — Encounter: Payer: Self-pay | Admitting: Internal Medicine

## 2013-04-25 NOTE — Telephone Encounter (Signed)
Pt aware we can not monitor INR's from home.  He is requesting if he can be changed to one of the novelty agents if we can get it covered through his insurance since it is too hard for him to get into the office and can't afford the co-payment.  He stated that he can not come in anymore to have his INR checked.  I advised him that we can not continue to RX if he is not having follow up.  Advised he have his INR checked at his PCP but he sees Urgent [facility] and they do not follow or adjust them.  Aware I will forward information to Dr Antoine Poche for review.

## 2013-05-02 ENCOUNTER — Ambulatory Visit (INDEPENDENT_AMBULATORY_CARE_PROVIDER_SITE_OTHER): Payer: Medicare Other | Admitting: *Deleted

## 2013-05-02 ENCOUNTER — Encounter (HOSPITAL_COMMUNITY): Payer: Self-pay

## 2013-05-02 ENCOUNTER — Ambulatory Visit (HOSPITAL_BASED_OUTPATIENT_CLINIC_OR_DEPARTMENT_OTHER)
Admission: RE | Admit: 2013-05-02 | Discharge: 2013-05-02 | Disposition: A | Discharge status: Home / Self Care | Payer: Medicare Other | Source: Ambulatory Visit | Attending: Internal Medicine | Admitting: Internal Medicine

## 2013-05-02 ENCOUNTER — Ambulatory Visit (HOSPITAL_COMMUNITY)
Admission: RE | Admit: 2013-05-02 | Discharge: 2013-05-02 | Disposition: A | Discharge status: Home / Self Care | Payer: Medicare Other | Source: Ambulatory Visit | Attending: Internal Medicine | Admitting: Internal Medicine

## 2013-05-02 VITALS — BP 134/82 | HR 67 | Resp 18 | Wt 289.1 lb

## 2013-05-02 DIAGNOSIS — N183 Chronic kidney disease, stage 3 unspecified: Secondary | ICD-10-CM

## 2013-05-02 DIAGNOSIS — I4891 Unspecified atrial fibrillation: Secondary | ICD-10-CM

## 2013-05-02 DIAGNOSIS — I5022 Chronic systolic (congestive) heart failure: Secondary | ICD-10-CM

## 2013-05-02 DIAGNOSIS — I369 Nonrheumatic tricuspid valve disorder, unspecified: Secondary | ICD-10-CM

## 2013-05-02 DIAGNOSIS — Z91199 Patient's noncompliance with other medical treatment and regimen due to unspecified reason: Secondary | ICD-10-CM | POA: Insufficient documentation

## 2013-05-02 DIAGNOSIS — I509 Heart failure, unspecified: Secondary | ICD-10-CM | POA: Insufficient documentation

## 2013-05-02 DIAGNOSIS — Z9119 Patient's noncompliance with other medical treatment and regimen: Secondary | ICD-10-CM

## 2013-05-02 DIAGNOSIS — I10 Essential (primary) hypertension: Secondary | ICD-10-CM

## 2013-05-02 DIAGNOSIS — Z5181 Encounter for therapeutic drug level monitoring: Secondary | ICD-10-CM

## 2013-05-02 LAB — POCT INR: INR: 3.2

## 2013-05-02 NOTE — Progress Notes (Addendum)
Patient ID: Raymond [name], male   DOB: October 08, 1960, 53 y.o.   MRN: [id number]  [facility] Kat[facility]ink[facility] Blazing[facility]  Primary Cardiologist: Dr. Antoine PocheHochrein Primary EP: Dr. 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 He is s/p CRT-D.  LHC in 2008 with normal cors. Myoview 05/2010: Very mild distal anterior and apical ischemia, EF 17% (low risk-medical therapy continued). Previously on Amiodarone and had very high TSH and was referred to endocrinology. He has had several admissions over the last 6 mos with recurrent atrial fibrillation with RVR and volume overload. He has undergone TEE-DCCV but had return of AFib. He was last admitted 12/21-12/24 with a/c systolic CHF in the setting of AFib with RVR and loss of BiV pacing. AV nodal ablation was recommended for control of refractory atrial fibrillation. He underwent this procedure with Dr. Ladona Ridgelaylor 01/09/13. Amiodarone was discontinued at that time.   He returns for follow up with Raymond [name].  Last visit carvedilol was cut back to 12.5 mg twice a day due to fatigue. He says that helped a little. SOB with exertion. He is SOB with activities around the house. SOB with ADLS. He is not weighing at home. He has not had sleep study. He has not had medications today due to appointment. Says he wont take diuretics on the days he has appointments due to increased urination. He has difficulty paying for medications. Not able to exercise. [facility].   Labs 04/02/13 K 4.1 Creatinine 1.52 Pro BNP 2869  SH: Lives alone.  Raymond [name] drives him to appointments. Disabled.since 2010 . 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  Past Medical History  Diagnosis Date  . Non-ischemic cardiomyopathy     a. echo 11/10: EF 15%;   b. cath 10/08:  normal cors;  c. Myoview 5/12: Very mild distal ant and apical isch, EF 17% (low risk-medical Rx cont'd);  d.  Echo 4/14:  EF 15%;  e. 05/2009 s/p SJM BiV ICD;  f. 07/2012 TEE: EF 20-25%, diff HK mild MR, mod TR.   Marland Kitchen. Atrial fibrillation     a. on coumadin;  b. 07/2012 s/p TEE/DCCV. c. Recurrence 08/2012 in setting of abnormal thyroid panel.; 12/2012 AVN ablation by Dr Ladona Ridgelaylor  . Hypertension   . Obesity   . Non-compliance   . Dyslexia   . 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 RBBB (right bundle branch block)   . Cough syncope     a. During 08/2012 adm.  . Systolic CHF, chronic 01/19/1996  . Hypothyroidism 01/19/1996    s/p RAI therapy  . Stroke 01/19/2008    Current Outpatient Prescriptions  Medication Sig Dispense Refill  . acyclovir (ZOVIRAX) 800 MG tablet Take 400 mg by mouth 2 (two) times daily. Take 1/2 tablet twice a day      . ALPRAZolam (XANAX) 0.25 MG tablet Take 1 tablet (0.25 mg total) by mouth at bedtime as needed for anxiety.  30 tablet  2  . carvedilol (COREG) 25 MG tablet Take 0.5 tablets (12.5 mg total) by mouth 2 (two) times daily with a meal.  30 tablet  3  . hydrALAZINE (APRESOLINE) 10 MG tablet Take  1 tablet (10 mg total) by mouth 3 (three) times daily.  90 tablet  5  . levothyroxine (SYNTHROID, LEVOTHROID) 125 MCG tablet Take 250 mcg by mouth daily before breakfast.      . losartan (COZAAR) 25 MG tablet Take 25 mg by mouth daily.      . potassium chloride SA (KLOR-CON M20) 20 MEQ tablet Take 60 mEq by mouth at bedtime.      Marland Kitchen spironolactone (ALDACTONE) 12.5 mg TABS tablet Take 12.5 mg by mouth daily.      Marland Kitchen torsemide (DEMADEX) 20 MG tablet Take 3 tablets (60 mg total) by mouth daily.  90 tablet  3  . warfarin (COUMADIN) 7.5 MG tablet Take 7.5 mg by mouth daily at 6 PM.      . metolazone (ZAROXOLYN) 2.5 MG tablet Take 2.5 mg on Monday, Wednesday and Friday or as needed.  15 tablet  3   No current  facility-administered medications for this encounter.    Filed Vitals:   05/02/13 1317  BP: 134/82  Pulse: 67  Resp: 18  Weight: 289 lb 2 oz (131.146 kg)  SpO2: 97%    PHYSICAL EXAM: General:  Chronically ill appearing. No resp difficulty; Raymond [name] present HEENT: normal Neck: supple. JVP difficult to assess d/t body habitus but appears elevated to jaw.  Carotids 2+ bilaterally; no bruits. No lymphadenopathy or thryomegaly appreciated. Cor: PMI normal. Regular rate & rhythm. No rubs, gallops or murmurs. Lungs: clear Abdomen: Obese; soft, nontender, ++ distended. No hepatosplenomegaly. No bruits or masses. Good bowel sounds. Extremities: no cyanosis, clubbing, rash, trace edema Neuro: alert & orientedx3, cranial nerves grossly intact.    ASSESSMENT & PLAN:  1) Chronic systolic HF: NICM s/p CRT-D, EF 20-25% (07/2012). Had ECHO today will call with results. - NYHA III/IIIb symptoms. Volume status elevated but not surprising since he has not had diuretics today. I am not gong titrate meds today because he has not had his medications today.    . Continue reduced dose of carvedilol 12.5 mg twice day. This was cut back at last visit due to fatigue.  - Continue Spiro 12.5 mg daily, hydralazine 10 mg TID and IMDUR 30 mg daily.  -- Lengthy discussion regarding daily weights, low salt food choices, medications compliance and limiting fluid intake to < 2 liters per day.  I have provided with weight chart for daily weights. I have asked him to bring all medications and weight chart to next visit.  Refer to [facility] to assist with HF management.  2) Afib- S/P AVN abalation by Dr [name] with most recent in December 2014.    Continue Coumadin. [facility]. 3) CKD, stage II-  Follow BMET.   4) HTN- Stable. Continue current medications.  5) Noncompliance- Reinforced medication compliance and daily weights  Follow up in 2 weeks. Refer to [facility] for telemedicine.  Johnathin Vanderschaaf D Alois Colgan NP-C 1:23  PM

## 2013-05-02 NOTE — Progress Notes (Signed)
Echo Lab  2D Echocardiogram completed.  Zeva Leber L Alya Smaltz, RDCS 05/02/2013 1:03 PM

## 2013-05-02 NOTE — Patient Instructions (Signed)
Follow up in 2 weeks.   Please bring all your medications to your next appointment  Please bring your weight chart  Do the following things EVERYDAY: 1) Weigh yourself in the morning before breakfast. Write it down and keep it in a log. 2) Take your medicines as prescribed 3) Eat low salt foods-Limit salt (sodium) to 2000 mg per day.  4) Stay as active as you can everyday 5) Limit all fluids for the day to less than 2 liters

## 2013-05-03 ENCOUNTER — Encounter (HOSPITAL_COMMUNITY): Payer: Medicare Other

## 2013-05-03 ENCOUNTER — Other Ambulatory Visit (HOSPITAL_COMMUNITY): Payer: Medicare Other

## 2013-05-04 ENCOUNTER — Telehealth (HOSPITAL_COMMUNITY): Payer: Self-pay | Admitting: Anesthesiology

## 2013-05-04 NOTE — Telephone Encounter (Signed)
Reviewed ECHO results with patient. EF back down to 10%. Instructed to please take medications as prescribed.  Aundria Rud NP-C 4:45 PM

## 2013-05-05 DIAGNOSIS — I509 Heart failure, unspecified: Secondary | ICD-10-CM

## 2013-05-05 DIAGNOSIS — I69959 Hemiplegia and hemiparesis following unspecified cerebrovascular disease affecting unspecified side: Secondary | ICD-10-CM

## 2013-05-05 DIAGNOSIS — I5023 Acute on chronic systolic (congestive) heart failure: Secondary | ICD-10-CM

## 2013-05-05 DIAGNOSIS — I4891 Unspecified atrial fibrillation: Secondary | ICD-10-CM

## 2013-05-07 NOTE — Telephone Encounter (Signed)
Per Dr Antoine Poche - we can not monitor INRs from home.  This has been explained to the pt and that he must have them done in the office.

## 2013-05-15 NOTE — Progress Notes (Signed)
Patient ID: Raymond [name], male   DOB: [birth year], 53 y.o.   MRN: [id number]  PCP: [facility] Dr Katrinka Blazing  Primary Cardiologist: Dr. Antoine Poche Primary EP: Dr. 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   LHC in 2008 with normal cors. Myoview 05/2010: Very mild distal anterior and apical ischemia, EF 17% (low risk-medical therapy continued). Previously on Amiodarone and had very high TSH and was referred to endocrinology. He has had several admissions over the last 6 mos with recurrent atrial fibrillation with RVR and volume overload. He has undergone TEE-DCCV but had return of AFib. He was last admitted 12/21-12/24 with a/c systolic CHF in the setting of AFib with RVR and loss of BiV pacing. AV nodal ablation was recommended for control of refractory atrial fibrillation. He underwent this procedure with Dr. [name] 01/09/13. Amiodarone was discontinued at that time.    He returns for follow up with his Mom. He has been compliant with medications.  He says his friend Darlene refills his medications. He has not had his medications this am. Complains of profound  fatigue. Denies SOB/PND/Orthopnea. Weight at home 286 pounds.  Followed by [facility]. Not able to exercise. Coumadin followed by [facility]in clinic. Friends provide meals 5 days a week. Difficulty with transportation.   ECHO 05/02/13 EF 10%   Labs 04/02/13 K 4.1 Creatinine 1.52 Pro BNP 2869  SH: Lives alone.  Mom drives him to appointments. Disabled.since 2010 . 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  Past Medical History  Diagnosis Date  . Non-ischemic cardiomyopathy     a. echo 11/10: EF 15%;   b. cath 10/08: normal cors;  c. Myoview 5/12: Very mild distal ant and apical isch, EF 17% (low risk-medical  Rx cont'd);  d.  Echo 4/14:  EF 15%;  e. 05/2009 s/p SJM BiV ICD;  f. 07/2012 TEE: EF 20-25%, diff HK mild MR, mod TR.   Marland Kitchen Atrial fibrillation     a. on coumadin;  b. 07/2012 s/p TEE/DCCV. c. Recurrence 08/2012 in setting of abnormal thyroid panel.; 12/2012 AVN ablation by Dr [name]  . Hypertension   . Obesity   . Non-compliance   . Dyslexia   . 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 RBBB (right bundle branch block)   . Cough syncope     a. During 08/2012 adm.  . Systolic CHF, chronic 01/19/1996  . Hypothyroidism 01/19/1996    s/p RAI therapy  . Stroke 01/19/2008    Current Outpatient Prescriptions  Medication Sig Dispense Refill  . acyclovir (ZOVIRAX) 800 MG tablet Take 400 mg by mouth 2 (two) times daily. Take 1/2 tablet twice a day      . ALPRAZolam (XANAX) 0.25 MG tablet Take 1 tablet (0.25 mg total) by mouth at bedtime as needed for anxiety.  30 tablet  2  . carvedilol (COREG) 25 MG tablet Take 0.5 tablets (12.5 mg total) by mouth 2 (two) times daily with a meal.  30 tablet  3  . levothyroxine (SYNTHROID, LEVOTHROID) 125 MCG tablet Take 250 mcg by mouth daily before breakfast.      . losartan (COZAAR) 25 MG tablet Take 25  mg by mouth daily.      . metolazone (ZAROXOLYN) 2.5 MG tablet Take 2.5 mg on Monday, Wednesday and Friday or as needed.  15 tablet  3  . potassium chloride SA (KLOR-CON M20) 20 MEQ tablet Take 60 mEq by mouth at bedtime.      Marland Kitchen. spironolactone (ALDACTONE) 12.5 mg TABS tablet Take 12.5 mg by mouth daily.      Marland Kitchen. torsemide (DEMADEX) 20 MG tablet Take 3 tablets (60 mg total) by mouth daily.  90 tablet  3  . warfarin (COUMADIN) 7.5 MG tablet Take 7.5 mg by mouth daily at 6 PM.      . hydrALAZINE (APRESOLINE) 10 MG tablet Take 1 tablet (10 mg total) by mouth 3 (three) times daily.  90 tablet  5   No current facility-administered medications for this encounter.    Filed Vitals:   05/16/13 1401  BP:  136/84  Pulse: 71  Weight: 294 lb (133.358 kg)  SpO2: 97%    PHYSICAL EXAM: General:  Chronically ill appearing. No resp difficulty; mom present. Ambulated in clinic with a cane.  HEENT: normal Neck: supple. JVP difficult to assess d/t body habitus but does not appears.  Carotids 2+ bilaterally; no bruits. No lymphadenopathy or thryomegaly appreciated. Cor: PMI normal. Regular rate & rhythm. No rubs, gallops or murmurs. Lungs: clear Abdomen: Obese; soft, nontender, ++ distended. No hepatosplenomegaly. No bruits or masses. Good bowel sounds. Extremities: no cyanosis, clubbing, rash, trace edema Neuro: alert & orientedx3, cranial nerves grossly intact.    ASSESSMENT & PLAN:  1) Chronic systolic HF: NICM s/p ST Jude CRT-D, ECHO EF 10%.  - NYHA III/IIIb symptoms. I have gone through each bottle of medication and he was taking metoprolol XL and carvedilol. I also contacted his friend Agustin CreeDarlene as she prepares his medications weekly. She says that she has been going by his medicine bottles and not the AVS provided at appointments which means he was getting metoprolol and carvedilol.  Ongoing fatigue but not surprised as he was taking 2 beta blockers.  Volume status mildly elevated but he does not take medications when he has appointments. Continue current dose of torsemide and metolazone.  Instructed to stop metoprolol and only take carvedilol 6.25 mg twice a day.  Continue  Spiro 12.5 mg daily, hydralazine 10 mg TID and IMDUR 30 mg daily.  -- Lengthy discussion regarding daily weights, low salt food choices, medications compliance and limiting fluid intake to < 2 liters per day.  If fatigue persists would like to consider CPX however he is reluctant.  I have left [facility] a message regarding home weights and BP. Check BMET at next visit.  2) Afib- S/P AVN abalation by Dr Ladona Ridgelaylor with most recent in December 2014.    On Coumadin. No bleeding problems.  [facility]. 3) CKD, stage  II-  Follow BMET.  Check at next visit.  4) HTN- Stable. Continue current medications.  5) Noncompliance- Reinforced medication compliance and daily weights  Follow up in 2 weeks. Hopefully can titrate meds.  Amy D Clegg NP-C 2:32 PM

## 2013-05-16 ENCOUNTER — Ambulatory Visit (HOSPITAL_COMMUNITY)
Admission: RE | Admit: 2013-05-16 | Discharge: 2013-05-16 | Disposition: A | Discharge status: Home / Self Care | Payer: Medicare Other | Source: Ambulatory Visit | Attending: Internal Medicine | Admitting: Internal Medicine

## 2013-05-16 VITALS — BP 136/84 | HR 71 | Wt 294.0 lb

## 2013-05-16 DIAGNOSIS — I5022 Chronic systolic (congestive) heart failure: Secondary | ICD-10-CM

## 2013-05-16 DIAGNOSIS — N183 Chronic kidney disease, stage 3 unspecified: Secondary | ICD-10-CM

## 2013-05-16 DIAGNOSIS — Z91199 Patient's noncompliance with other medical treatment and regimen due to unspecified reason: Secondary | ICD-10-CM

## 2013-05-16 DIAGNOSIS — Z9119 Patient's noncompliance with other medical treatment and regimen: Secondary | ICD-10-CM

## 2013-05-16 DIAGNOSIS — I4891 Unspecified atrial fibrillation: Secondary | ICD-10-CM

## 2013-05-16 NOTE — Patient Instructions (Addendum)
Follow up in 2 weeks  DO NOT TAKE METOPROLOL  Do the following things EVERYDAY: 1) Weigh yourself in the morning before breakfast. Write it down and keep it in a log. 2) Take your medicines as prescribed 3) Eat low salt foods-Limit salt (sodium) to 2000 mg per day.  4) Stay as active as you can everyday 5) Limit all fluids for the day to less than 2 liters

## 2013-05-18 ENCOUNTER — Telehealth (HOSPITAL_COMMUNITY): Payer: Self-pay | Admitting: Cardiology

## 2013-05-18 NOTE — Telephone Encounter (Signed)
Returned call to Amy regarding b/p readings SBP 148-120 DBP 70-80 HR 70's Weight normally 286-287 Weight 5/1 293 bp 110/60   Please call of you have any questions

## 2013-05-21 NOTE — Telephone Encounter (Signed)
Spoke w/Kelly she states BP has been running 115-125 over 70-80s, wt stable at 286-288, she states meds are correct in his pill box, pt is seeing [place] again on 5/13 advised she could c/b if has concerns before then

## 2013-05-28 NOTE — Progress Notes (Signed)
Patient ID: Raymond [name], male   DOB: 1960-03-23, 53 y.o.   MRN: [id number]  [facility] Katrinka Blazing[facility]  Primary Cardiologist: Dr. Antoine PocheHochrein Primary EP: Dr. 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   LHC in 2008 with normal cors. Myoview 05/2010: Very mild distal anterior and apical ischemia, EF 17% (low risk-medical therapy continued). Previously on Amiodarone and had very high TSH and was referred to endocrinology. He has had several admissions over the last 6 mos with recurrent atrial fibrillation with RVR and volume overload. He has undergone TEE-DCCV but had return of AFib. He was last admitted 12/21-12/24 with a/c systolic CHF in the setting of AFib with RVR and loss of BiV pacing. AV nodal ablation was recommended for control of refractory atrial fibrillation. He underwent this procedure with Dr. Ladona Ridgelaylor 01/09/13. Amiodarone was discontinued at that time.    He returns for follow up with his Mom. Last visit he was taking carvedilol and metoprolol. He was instructed to stop metoprolol. Says he really cant tell a difference and still complains of fatigue. Mild dyspnea ambulating.   He says his friend Darlene refills his medications. He had all medications except diuretics due to this visit. . Denies SOB/PND. + Orthopnea sleeps on 3 pillows.  Weight at home 288-293 pounds. [facility] Not able to [facility]. [facility]. Friends provide meals 5 days a week. Difficulty with transportation.   ECHO 05/02/13 EF 10%   Labs 04/02/13 K 4.1 Creatinine 1.52 Pro BNP 2869  SH: Lives alone.  Mom drives him to appointments. Disabled.since 2010 . 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  Past Medical History  Diagnosis Date  .  Non-ischemic cardiomyopathy     a. echo 11/10: EF 15%;   b. cath 10/08: normal cors;  c. Myoview 5/12: Very mild distal ant and apical isch, EF 17% (low risk-medical Rx cont'd);  d.  Echo 4/14:  EF 15%;  e. 05/2009 s/p SJM BiV ICD;  f. 07/2012 TEE: EF 20-25%, diff HK mild MR, mod TR.   Marland Kitchen. Atrial fibrillation     a. on coumadin;  b. 07/2012 s/p TEE/DCCV. c. Recurrence 08/2012 in setting of abnormal thyroid panel.; 12/2012 AVN ablation by Dr Ladona Ridgelaylor  . Hypertension   . Obesity   . Non-compliance   . Dyslexia   . 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 RBBB (right bundle branch block)   . Cough syncope     a. During 08/2012 adm.  . Systolic CHF, chronic 01/19/1996  . Hypothyroidism 01/19/1996    s/p RAI therapy  . Stroke 01/19/2008    Current Outpatient Prescriptions  Medication Sig Dispense Refill  . acyclovir (ZOVIRAX) 800 MG tablet Take 400 mg by mouth 2 (two) times daily. Take 1/2 tablet twice a day      . ALPRAZolam (XANAX) 0.25 MG tablet Take 1 tablet (0.25 mg total) by mouth at bedtime as needed for anxiety.  30 tablet  2  . carvedilol (COREG) 25 MG tablet Take 0.5 tablets (12.5 mg total) by mouth 2 (two) times daily with a meal.  30 tablet  3  .  hydrALAZINE (APRESOLINE) 10 MG tablet Take 1 tablet (10 mg total) by mouth 3 (three) times daily.  90 tablet  5  . levothyroxine (SYNTHROID, LEVOTHROID) 125 MCG tablet Take 250 mcg by mouth daily before breakfast.      . losartan (COZAAR) 25 MG tablet Take 25 mg by mouth daily.      . metolazone (ZAROXOLYN) 2.5 MG tablet Take 2.5 mg on Monday, Wednesday and Friday or as needed.  15 tablet  3  . potassium chloride SA (KLOR-CON M20) 20 MEQ tablet Take 60 mEq by mouth at bedtime.      Marland Kitchen spironolactone (ALDACTONE) 12.5 mg TABS tablet Take 12.5 mg by mouth daily.      Marland Kitchen torsemide (DEMADEX) 20 MG tablet Take 3 tablets (60 mg total) by mouth daily.  90 tablet  3  . warfarin (COUMADIN) 7.5  MG tablet Take 7.5 mg by mouth daily at 6 PM.       No current facility-administered medications for this encounter.    Filed Vitals:   05/30/13 1231  BP: 122/82  Pulse: 72  Weight: 288 lb (130.636 kg)  SpO2: 94%    PHYSICAL EXAM: General:  Chronically ill appearing. No resp difficulty; mom present. Ambulated in clinic with a cane.  HEENT: normal Neck: supple. JVP difficult to assess d/t body habitus but does not appear elevated. Carotids 2+ bilaterally; no bruits. No lymphadenopathy or thryomegaly appreciated. Cor: PMI normal. Regular rate & rhythm. No rubs, gallops or murmurs. Lungs: clear Abdomen: Obese; soft, nontender, + distended. No hepatosplenomegaly. No bruits or masses. Good bowel sounds. Extremities: no cyanosis, clubbing, rash, no edema Neuro: alert & orientedx3, cranial nerves grossly intact.    ASSESSMENT & PLAN:  1) Chronic systolic HF: NICM s/p ST Jude CRT-D, ECHO EF 10%.  - NYHA III/IIIb symptoms.    Volume status stable. Continue current dose of torsemide and metolazone. Also 12.5 mg spironolactone daily.  - Continue carvedilol  12.5 mg twice a day will not increase due to fatigue.  He is agreeable to up titration of hydralazine to 25 mg tid and imdur 30 mg daily. Says he is only going to increase after he runs out of current hydralazine prescription.  -- Lengthy discussion regarding daily weights, low salt food choices, medications compliance and limiting fluid intake to < 2 liters per day.  If fatigue persists would like to consider CPX however he remains reluctant.  [facility] following and wil check BMET and INR next week.  2) Afib- S/P AVN abalation by Dr [name] with most recent in December 2014.    On Coumadin. No bleeding problems.  Followed at [facility] coumadin clinic. 3) CKD, stage II-  Check BMET next week by [facility].  4) HTN- Stable. Continue current medications.  5) Noncompliance- Reinforced medication compliance and daily weights  Follow up in 4 weeks. Hopefully  can continue to titrate meds.  Briselda Naval D Charnee Turnipseed NP-C 12:37 PM

## 2013-05-30 ENCOUNTER — Ambulatory Visit (HOSPITAL_COMMUNITY)
Admission: RE | Admit: 2013-05-30 | Discharge: 2013-05-30 | Disposition: A | Discharge status: Home / Self Care | Payer: Medicare Other | Source: Ambulatory Visit | Attending: Internal Medicine | Admitting: Internal Medicine

## 2013-05-30 ENCOUNTER — Ambulatory Visit (INDEPENDENT_AMBULATORY_CARE_PROVIDER_SITE_OTHER): Payer: Medicare Other | Admitting: *Deleted

## 2013-05-30 VITALS — BP 122/82 | HR 72 | Wt 288.0 lb

## 2013-05-30 DIAGNOSIS — Z5181 Encounter for therapeutic drug level monitoring: Secondary | ICD-10-CM

## 2013-05-30 DIAGNOSIS — I10 Essential (primary) hypertension: Secondary | ICD-10-CM

## 2013-05-30 DIAGNOSIS — Z950 Presence of cardiac pacemaker: Secondary | ICD-10-CM | POA: Insufficient documentation

## 2013-05-30 DIAGNOSIS — I129 Hypertensive chronic kidney disease with stage 1 through stage 4 chronic kidney disease, or unspecified chronic kidney disease: Secondary | ICD-10-CM | POA: Insufficient documentation

## 2013-05-30 DIAGNOSIS — Z9119 Patient's noncompliance with other medical treatment and regimen: Secondary | ICD-10-CM

## 2013-05-30 DIAGNOSIS — N183 Chronic kidney disease, stage 3 unspecified: Secondary | ICD-10-CM

## 2013-05-30 DIAGNOSIS — Z91199 Patient's noncompliance with other medical treatment and regimen due to unspecified reason: Secondary | ICD-10-CM

## 2013-05-30 DIAGNOSIS — N182 Chronic kidney disease, stage 2 (mild): Secondary | ICD-10-CM | POA: Insufficient documentation

## 2013-05-30 DIAGNOSIS — I4891 Unspecified atrial fibrillation: Secondary | ICD-10-CM

## 2013-05-30 DIAGNOSIS — I5022 Chronic systolic (congestive) heart failure: Secondary | ICD-10-CM | POA: Insufficient documentation

## 2013-05-30 LAB — POCT INR: INR: 1.5

## 2013-05-30 MED ORDER — HYDRALAZINE HCL 25 MG PO TABS: 25.0000 mg | ORAL_TABLET | Freq: Three times a day (TID) | ORAL | Status: DC

## 2013-05-30 NOTE — Patient Instructions (Signed)
Follow up in 1 month   Take 25 mg hydralazine three times a day. Please start when you out of hydralazine  Do the following things EVERYDAY: 1) Weigh yourself in the morning before breakfast. Write it down and keep it in a log. 2) Take your medicines as prescribed 3) Eat low salt foods-Limit salt (sodium) to 2000 mg per day.  4) Stay as active as you can everyday 5) Limit all fluids for the day to less than 2 liters

## 2013-06-06 ENCOUNTER — Ambulatory Visit (INDEPENDENT_AMBULATORY_CARE_PROVIDER_SITE_OTHER): Payer: Medicare Other | Admitting: Cardiovascular Disease

## 2013-06-06 ENCOUNTER — Encounter: Payer: Self-pay | Admitting: Internal Medicine

## 2013-06-06 DIAGNOSIS — I4891 Unspecified atrial fibrillation: Secondary | ICD-10-CM

## 2013-06-06 DIAGNOSIS — Z5181 Encounter for therapeutic drug level monitoring: Secondary | ICD-10-CM

## 2013-06-06 LAB — POCT INR: INR: 1.8

## 2013-06-13 ENCOUNTER — Ambulatory Visit (INDEPENDENT_AMBULATORY_CARE_PROVIDER_SITE_OTHER): Payer: Medicare Other | Admitting: Cardiovascular Disease

## 2013-06-13 DIAGNOSIS — I4891 Unspecified atrial fibrillation: Secondary | ICD-10-CM

## 2013-06-13 DIAGNOSIS — Z5181 Encounter for therapeutic drug level monitoring: Secondary | ICD-10-CM

## 2013-06-13 LAB — POCT INR: INR: 2.3

## 2013-06-27 ENCOUNTER — Ambulatory Visit (INDEPENDENT_AMBULATORY_CARE_PROVIDER_SITE_OTHER): Payer: Medicare Other | Admitting: *Deleted

## 2013-06-27 DIAGNOSIS — Z5181 Encounter for therapeutic drug level monitoring: Secondary | ICD-10-CM

## 2013-06-27 DIAGNOSIS — I4891 Unspecified atrial fibrillation: Secondary | ICD-10-CM

## 2013-06-27 LAB — POCT INR: INR: 2

## 2013-07-16 ENCOUNTER — Other Ambulatory Visit: Payer: Self-pay | Admitting: Endocrinology

## 2013-07-16 ENCOUNTER — Encounter (HOSPITAL_COMMUNITY): Payer: Self-pay

## 2013-07-16 ENCOUNTER — Ambulatory Visit (HOSPITAL_COMMUNITY)
Admission: RE | Admit: 2013-07-16 | Discharge: 2013-07-16 | Disposition: A | Discharge status: Home / Self Care | Payer: Medicare Other | Source: Ambulatory Visit | Attending: Internal Medicine | Admitting: Internal Medicine

## 2013-07-16 VITALS — BP 122/65 | HR 68 | Resp 18 | Wt 288.2 lb

## 2013-07-16 DIAGNOSIS — I480 Paroxysmal atrial fibrillation: Secondary | ICD-10-CM

## 2013-07-16 DIAGNOSIS — I509 Heart failure, unspecified: Secondary | ICD-10-CM | POA: Diagnosis present

## 2013-07-16 DIAGNOSIS — I129 Hypertensive chronic kidney disease with stage 1 through stage 4 chronic kidney disease, or unspecified chronic kidney disease: Secondary | ICD-10-CM | POA: Diagnosis not present

## 2013-07-16 DIAGNOSIS — I428 Other cardiomyopathies: Secondary | ICD-10-CM | POA: Insufficient documentation

## 2013-07-16 DIAGNOSIS — Z91199 Patient's noncompliance with other medical treatment and regimen due to unspecified reason: Secondary | ICD-10-CM

## 2013-07-16 DIAGNOSIS — E039 Hypothyroidism, unspecified: Secondary | ICD-10-CM | POA: Diagnosis not present

## 2013-07-16 DIAGNOSIS — I5022 Chronic systolic (congestive) heart failure: Secondary | ICD-10-CM | POA: Insufficient documentation

## 2013-07-16 DIAGNOSIS — I4891 Unspecified atrial fibrillation: Secondary | ICD-10-CM | POA: Diagnosis not present

## 2013-07-16 DIAGNOSIS — Z95810 Presence of automatic (implantable) cardiac defibrillator: Secondary | ICD-10-CM

## 2013-07-16 DIAGNOSIS — N182 Chronic kidney disease, stage 2 (mild): Secondary | ICD-10-CM | POA: Diagnosis not present

## 2013-07-16 DIAGNOSIS — N183 Chronic kidney disease, stage 3 unspecified: Secondary | ICD-10-CM

## 2013-07-16 DIAGNOSIS — Z9119 Patient's noncompliance with other medical treatment and regimen: Secondary | ICD-10-CM | POA: Diagnosis not present

## 2013-07-16 DIAGNOSIS — Z8673 Personal history of transient ischemic attack (TIA), and cerebral infarction without residual deficits: Secondary | ICD-10-CM | POA: Insufficient documentation

## 2013-07-16 LAB — BASIC METABOLIC PANEL
BUN: 24 mg/dL — ABNORMAL HIGH (ref 6–23)
CALCIUM: 9.6 mg/dL (ref 8.4–10.5)
CO2: 24 meq/L (ref 19–32)
Chloride: 95 mEq/L — ABNORMAL LOW (ref 96–112)
Creatinine, Ser: 1.38 mg/dL — ABNORMAL HIGH (ref 0.50–1.35)
GFR calc Af Amer: 67 mL/min — ABNORMAL LOW (ref >90)
GFR calc non Af Amer: 58 mL/min — ABNORMAL LOW (ref >90)
GLUCOSE: 123 mg/dL — AB (ref 70–99)
Potassium: 3.7 mEq/L (ref 3.7–5.3)
Sodium: 136 mEq/L — ABNORMAL LOW (ref 137–147)

## 2013-07-16 NOTE — Progress Notes (Signed)
Patient ID: Raymond [name], male   DOB: 09-Mar-1960, 53 y.o.   MRN: [id number]  PCP: [name] Raymond [name]  Primary Cardiologist: Dr. Antoine [name] Primary EP: Dr. 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   LHC in 2008 with normal cors. Myoview 05/2010: Very mild distal anterior and apical ischemia, EF 17% (low risk-medical therapy continued). Previously on Amiodarone and had very high TSH and was referred to endocrinology. He has had several admissions over the last 6 mos with recurrent atrial fibrillation with RVR and volume overload. He has undergone TEE-DCCV but had return of AFib. He was last admitted 12/21-12/24 with a/c systolic CHF in the setting of AFib with RVR and loss of BiV pacing. AV nodal ablation was recommended for control of refractory atrial fibrillation. He underwent this procedure with Dr. Ladona [name] 01/09/13. Amiodarone was discontinued at that time.    Follow up for Heart Failure: Last visit increased hydralazine to 25 mg TID and Imdur to 30 mg daily however patient did not increase d/t he has dizziness. Has dizziness with position changes. Overall feeling ok. SOB with minimal activity. + fatigue. Friend fills medications for him. + orthopnea (3 pillows). Misses medications when he is out and about. Weight at home 287 lbs. Not able to exercise. Coumadin followed by [name]. Difficulty with transportation.   ECHO 05/02/13 EF 10%   Labs 04/02/13 K 4.1 Creatinine 1.52 Pro BNP 2869 Labs 06/06/13 K 3.5, creatinine 1.51  SH: Lives alone.  Raymond [name] drives him to appointments. Disabled.since 2010 . 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  Past Medical History  Diagnosis Date  . Non-ischemic cardiomyopathy     a. echo 11/10: EF  15%;   b. cath 10/08: normal cors;  c. Myoview 5/12: Very mild distal ant and apical isch, EF 17% (low risk-medical Rx cont'd);  d.  Echo 4/14:  EF 15%;  e. 05/2009 s/p SJM BiV ICD;  f. 07/2012 TEE: EF 20-25%, diff HK mild MR, mod TR.   Marland Kitchen Atrial fibrillation     a. on coumadin;  b. 07/2012 s/p TEE/DCCV. c. Recurrence 08/2012 in setting of abnormal thyroid panel.; 12/2012 AVN ablation by Dr Raymond [name]  . Hypertension   . Obesity   . Non-compliance   . Dyslexia   . 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 RBBB (right bundle branch block)   . Cough syncope     a. During 08/2012 adm.  . Systolic CHF, chronic 01/19/1996  . Hypothyroidism 01/19/1996    s/p RAI therapy  . Stroke 01/19/2008    Current Outpatient Prescriptions  Medication Sig Dispense Refill  . acyclovir (ZOVIRAX) 800 MG tablet Take 400 mg by mouth 2 (two) times daily. Take 1/2 tablet twice a day      . ALPRAZolam (XANAX) 0.25 MG tablet Take 1 tablet (0.25 mg total) by mouth at bedtime as needed for anxiety.  30 tablet  2  . carvedilol (COREG) 25 MG tablet Take 0.5 tablets (12.5 mg total) by mouth 2 (two) times daily with a meal.  30 tablet  3  . hydrALAZINE (APRESOLINE)  25 MG tablet Take 12.5 mg by mouth 3 (three) times daily.      Marland Kitchen. levothyroxine (SYNTHROID, LEVOTHROID) 125 MCG tablet Take 250 mcg by mouth daily before breakfast.      . losartan (COZAAR) 25 MG tablet Take 25 mg by mouth daily.      . metolazone (ZAROXOLYN) 2.5 MG tablet Take 2.5 mg on Monday, Wednesday and Friday or as needed.  15 tablet  3  . potassium chloride SA (KLOR-CON M20) 20 MEQ tablet Take 60 mEq by mouth at bedtime.      Marland Kitchen. spironolactone (ALDACTONE) 12.5 mg TABS tablet Take 12.5 mg by mouth daily.      Marland Kitchen. torsemide (DEMADEX) 20 MG tablet Take 3 tablets (60 mg total) by mouth daily.  90 tablet  3  . warfarin (COUMADIN) 7.5 MG tablet Take 7.5 mg by mouth daily at 6 PM.       No current  facility-administered medications for this encounter.    Filed Vitals:   07/16/13 1158  BP: 122/65  Pulse: 68  Resp: 18  Weight: 288 lb 4 oz (130.749 kg)  SpO2: 94%    PHYSICAL EXAM: General:  Chronically ill appearing. No resp difficulty; Raymond [name] present. Ambulated in clinic with a cane.  HEENT: normal Neck: supple. JVP difficult to assess d/t body habitus but appears 8. Carotids 2+ bilaterally; no bruits. No lymphadenopathy or thryomegaly appreciated. Cor: PMI normal. Regular rate & rhythm. No rubs, gallops or murmurs. Lungs: clear Abdomen: Obese; soft, nontender, non distended. No hepatosplenomegaly. No bruits or masses. Good bowel sounds. Extremities: no cyanosis, clubbing, rash, no edema Neuro: alert & orientedx3, cranial nerves grossly intact.    ASSESSMENT & PLAN:  1) Chronic systolic HF: NICM s/p ST Jude CRT-D, ECHO EF 10% (04/2013)  - NYHA IIIb symptoms and volume status mildly elevated. He did not take his diuretics today because he had an appointment and says he can't take when he gets home either because it will be too late. Stressed once again the need to take medications. Continue torsemide 60 mg daily and metolazone 2.5 mg on Monday, Wednesday and Friday. - Will continue coreg 12.5 mg BID. Will not titrate with dizziness and fatigue. - Patient did not increase hydralazine to 25 mg TID and says he is not sure if he is taking Imdur 30 mg daily. Have asked him to continue 12.5 mg TID since he has been having dizziness but to start taking Imdur 30 mg daily.  - Continue Spiro 12.5 mg daily. I wanted to increase to 25 mg daily, but patient has no way of increasing his meds on his own and has someone who fills his pill box and Raymond't be back until next week. He also can't come back in 7-10 days for blood work.  - Very difficult situation as patient has medication compliance issues. Will continue to work with him the best we can. He understands his heart is very weak and stressed the  importance of taking his medications.  - Reinforced the need and importance of daily weights, a low sodium diet, and fluid restriction (less than 2 L a day). Instructed to call the HF clinic if weight increases more than 3 lbs overnight or 5 lbs in a week.  2) Afib- S/P AVN abalation by Dr Raymond Ridgelaylor with most recent in December 2014.    On Coumadin. No bleeding problems.  [facility]. 3) CKD, stage II-  Check BMET today 4) HTN- Stable. Continue current  medications.  5) Noncompliance- Reinforced medication compliance and daily weights  Follow up in 6-8 weeks.  Ulla Potash B NP-C 12:16 PM

## 2013-07-16 NOTE — Patient Instructions (Signed)
Continue medications as prescribed.  Follow up in 6 weeks.  Do the following things EVERYDAY: 1) Weigh yourself in the morning before breakfast. Write it down and keep it in a log. 2) Take your medicines as prescribed 3) Eat low salt foods-Limit salt (sodium) to 2000 mg per day.  4) Stay as active as you can everyday 5) Limit all fluids for the day to less than 2 liters 6)

## 2013-07-25 ENCOUNTER — Ambulatory Visit (INDEPENDENT_AMBULATORY_CARE_PROVIDER_SITE_OTHER): Payer: Medicare Other | Admitting: *Deleted

## 2013-07-25 ENCOUNTER — Encounter: Payer: Self-pay | Admitting: Internal Medicine

## 2013-07-25 DIAGNOSIS — Z5181 Encounter for therapeutic drug level monitoring: Secondary | ICD-10-CM

## 2013-07-25 DIAGNOSIS — I4891 Unspecified atrial fibrillation: Secondary | ICD-10-CM

## 2013-07-25 DIAGNOSIS — I428 Other cardiomyopathies: Secondary | ICD-10-CM

## 2013-07-25 DIAGNOSIS — Z95810 Presence of automatic (implantable) cardiac defibrillator: Secondary | ICD-10-CM

## 2013-07-25 DIAGNOSIS — I5022 Chronic systolic (congestive) heart failure: Secondary | ICD-10-CM

## 2013-07-25 DIAGNOSIS — I480 Paroxysmal atrial fibrillation: Secondary | ICD-10-CM

## 2013-07-25 LAB — MDC_IDC_ENUM_SESS_TYPE_INCLINIC
Battery Remaining Longevity: 38.4 mo
Brady Statistic RA Percent Paced: 12 %
HIGH POWER IMPEDANCE MEASURED VALUE: 54 Ohm
Lead Channel Impedance Value: 425 Ohm
Lead Channel Impedance Value: 487.5 Ohm
Lead Channel Impedance Value: 725 Ohm
Lead Channel Pacing Threshold Amplitude: 0.5 V
Lead Channel Pacing Threshold Amplitude: 0.5 V
Lead Channel Pacing Threshold Amplitude: 0.75 V
Lead Channel Pacing Threshold Amplitude: 0.75 V
Lead Channel Pacing Threshold Pulse Width: 0.5 ms
Lead Channel Pacing Threshold Pulse Width: 0.5 ms
Lead Channel Sensing Intrinsic Amplitude: 12 mV
Lead Channel Sensing Intrinsic Amplitude: 2.7 mV
Lead Channel Setting Pacing Amplitude: 2 V
Lead Channel Setting Pacing Amplitude: 2 V
Lead Channel Setting Pacing Amplitude: 2.5 V
Lead Channel Setting Pacing Pulse Width: 0.5 ms
Lead Channel Setting Pacing Pulse Width: 0.5 ms
Lead Channel Setting Sensing Sensitivity: 0.5 mV
MDC IDC MSMT LEADCHNL RV PACING THRESHOLD PULSEWIDTH: 0.5 ms
MDC IDC MSMT LEADCHNL RV PACING THRESHOLD PULSEWIDTH: 0.5 ms
MDC IDC PG SERIAL: 610510
MDC IDC SESS DTM: 20150708175251
MDC IDC SET ZONE DETECTION INTERVAL: 280 ms
MDC IDC STAT BRADY RV PERCENT PACED: 85 %
Zone Setting Detection Interval: 250 ms
Zone Setting Detection Interval: 455 ms

## 2013-07-25 LAB — POCT INR: INR: 3.1

## 2013-07-25 NOTE — Progress Notes (Signed)
CRT-D device check in office. Thresholds and sensing consistent with previous device measurements. Lead impedance trends stable over time. 67% AT/AF + warfarin. No ventricular arrhythmia episodes recorded. Patient bi-ventricularly pacing 85% of the time. Device programmed with appropriate safety margins. Heart failure diagnostics reviewed and trends are stable for patient---CorVue up 05/11/13 x8 days. Vibratory alerts demonstrated for patient, pt knows to call clinic if felt. No changes made this session. Estimated longevity 2.60yrs. ROV w/ Dr. [name] in 25mo.

## 2013-07-26 ENCOUNTER — Ambulatory Visit (INDEPENDENT_AMBULATORY_CARE_PROVIDER_SITE_OTHER): Payer: Medicare Other | Admitting: Family Medicine

## 2013-07-26 VITALS — BP 117/81 | HR 70 | Temp 98.1°F | Resp 18 | Ht 69.0 in | Wt 288.0 lb

## 2013-07-26 DIAGNOSIS — M545 Low back pain, unspecified: Secondary | ICD-10-CM

## 2013-07-26 MED ORDER — HYDROCODONE-ACETAMINOPHEN 5-325 MG PO TABS: 1.0000 | ORAL_TABLET | Freq: Three times a day (TID) | ORAL | Status: DC | PRN

## 2013-07-26 MED ORDER — CYCLOBENZAPRINE HCL 10 MG PO TABS: 10.0000 mg | ORAL_TABLET | Freq: Two times a day (BID) | ORAL | Status: AC

## 2013-07-26 NOTE — Patient Instructions (Signed)
Use the flexeril (muscle relaxer) or the vicodin (pain medicine) as needed- avoid combining these with your xanax.  The muscle relaxer may cause you to be quite sleepy.    Let me know if you are not better in the next couple of days- Sooner if worse.

## 2013-07-26 NOTE — Progress Notes (Signed)
[facility] [address] [place] [address] [phone]  Date:  07/26/2013   Name:  Raymond [name]   DOB:  [birth year]   MRN:  [id number]  PCP:  Nilda Simmer, MD    Chief Complaint: Back Pain   History of Present Illness:  Raymond [name] is a 53 y.o. very pleasant male patient who presents with the following:  He is here today with lower back pain for about one week.  It is worse for about 2 days.  He is not aware of any injury, but does tend to have this problems about once a year.  He has been treated with flexeril an vicodin in the past with success,  He saw his pacemaker doctor yesterday.    He has chronic renal failure- most recent creat 1.38 Most recent INR yesterday at 3.1. He is on coumadin for a fib.  He does not note any other current concerns.   No numbness or weakness of his legs.  However his back hurts so much that he is having a hard time with his usual activities.    Patient Active Problem List   Diagnosis Date Noted  . Noncompliance 05/02/2013  . Atrial fibrillation 04/12/2013  . Long term (current) use of anticoagulants 04/12/2013  . Encounter for therapeutic drug monitoring 04/12/2013  . HTN (hypertension) 04/02/2013  . NSVT (nonsustained ventricular tachycardia) 02/28/2013  . Hypothyroid-elevated TSH on Amiodarone 02/27/2013  . Chronic anticoagulation 02/27/2013  . PAF-RFA  01/07/13- Amiodarone stopped 02/02/2013  . Acute on chronic clinical systolic heart failure 01/07/2013  . CKD (chronic kidney disease), stage III 08/09/2012  . 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 Non-ischemic cardiomyopathy   . Chronic systolic heart failure   . Morbid obesity-BMI43   . Biventricular implantable cardioverter-defibrillator in situ 06/26/2009  . Hypertensive heart disease     Past Medical History  Diagnosis Date  . Non-ischemic cardiomyopathy     a. echo 11/10: EF 15%;   b. cath 10/08:  normal cors;  c. Myoview 5/12: Very mild distal ant and apical isch, EF 17% (low risk-medical Rx cont'd);  d.  Echo 4/14:  EF 15%;  e. 05/2009 s/p SJM BiV ICD;  f. 07/2012 TEE: EF 20-25%, diff HK mild MR, mod TR.   Marland Kitchen Atrial fibrillation     a. on coumadin;  b. 07/2012 s/p TEE/DCCV. c. Recurrence 08/2012 in setting of abnormal thyroid panel.; 12/2012 AVN ablation by Dr [name]  . Hypertension   . Obesity   . Non-compliance   . Dyslexia   . 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 RBBB (right bundle branch block)   . Cough syncope     a. During 08/2012 adm.  . Systolic CHF, chronic 01/19/1996  . Hypothyroidism 01/19/1996    s/p RAI therapy  . Stroke 01/19/2008    Past Surgical History  Procedure Laterality Date  . Bi-ventricular implantable cardioverter defibrillator  (crt-d)  06/11/09    [employer] [id number] BIVENTRICULAR AICD SERIAL #[id number]  . Knee arthroscopy Left ~ 1995  . Cardiac catheterization      "several time" (09/01/2012)  . Tee without cardioversion N/A 06/23/2012    Procedure: TRANSESOPHAGEAL ECHOCARDIOGRAM (TEE);  Surgeon: Vesta Mixer, MD;  Location[facility] ENDOSCOPY;  Service: Cardiovascular;  Laterality: N/A;  TEE will be done at 0730   .  Cardioversion N/A 07/27/2012    Procedure: CARDIOVERSION;  Surgeon: Vesta MixerPhilip J Nahser, MD;  [facility];  Service: Cardiovascular;  Laterality: N/A;  . Tee without cardioversion N/A 08/10/2012    Procedure: TRANSESOPHAGEAL ECHOCARDIOGRAM (TEE);  Surgeon: Wendall StadePeter C Nishan, MD;  [facility];  Service: Cardiovascular;  Laterality: N/A;  . Cardioversion N/A 08/10/2012    Procedure: CARDIOVERSION;  Surgeon: Wendall StadePeter C Nishan, MD;  [facility];  Service: Cardiovascular;  Laterality: N/A;  . Sp pta add intra cran  11/2008    a. 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 Ablation  01/09/2013    AVN ablation by Dr Ladona Ridgelaylor     History  Substance Use Topics  . Smoking status: Never Smoker   . Smokeless tobacco: Never Used  . Alcohol Use: No    Family History  Problem Relation Age of Onset  . Heart disease Father [id number]    AMI  . Diabetes Father   . Diabetes Sister   . Atrial fibrillation Sister     Allergies  Allergen Reactions  . Valsartan Cough    Medication list has been reviewed and updated.  Current Outpatient Prescriptions on File Prior to Visit  Medication Sig Dispense Refill  . acyclovir (ZOVIRAX) 800 MG tablet Take 400 mg by mouth 2 (two) times daily. Take 1/2 tablet twice a day      . ALPRAZolam (XANAX) 0.25 MG tablet Take 1 tablet (0.25 mg total) by mouth at bedtime as needed for anxiety.  30 tablet  2  . carvedilol (COREG) 25 MG tablet Take 0.5 tablets (12.5 mg total) by mouth 2 (two) times daily with a meal.  30 tablet  3  . hydrALAZINE (APRESOLINE) 25 MG tablet Take 12.5 mg by mouth 3 (three) times daily.      Marland Kitchen. levothyroxine (SYNTHROID, LEVOTHROID) 125 MCG tablet TAKE 2 TABLETS BY MOUTH ONCE DAILY  60 tablet  0  . losartan (COZAAR) 25 MG tablet Take 25 mg by mouth daily.      . metolazone (ZAROXOLYN) 2.5 MG tablet Take 2.5 mg on Monday, Wednesday and Friday or as needed.  15 tablet  3  . potassium chloride SA (KLOR-CON M20) 20 MEQ tablet Take 60 mEq by mouth at bedtime.      Marland Kitchen. spironolactone (ALDACTONE) 12.5 mg TABS tablet Take 12.5 mg by mouth daily.      Marland Kitchen. torsemide (DEMADEX) 20 MG tablet Take 3 tablets (60 mg total) by mouth daily.  90 tablet  3  . warfarin (COUMADIN) 7.5 MG tablet Take 7.5 mg by mouth daily at 6 PM.      . levothyroxine (SYNTHROID, LEVOTHROID) 125 MCG tablet Take 250 mcg by mouth daily before breakfast.       No current facility-administered medications on file prior to visit.    Review of Systems:  As per HPI- otherwise negative.   Physical Examination: Filed Vitals:   07/26/13 1413  BP: 117/81  Pulse: 70  Temp: 98.1 F (36.7 C)  Resp: 18   Filed  Vitals:   07/26/13 1413  Height: 5\' 9"  (1.753 m)  Weight: 288 lb (130.636 kg)   Body mass index is 42.51 kg/(m^2). Ideal Body Weight: Weight in (lb) to have BMI = 25: 168.9  GEN: WDWN, NAD, Non-toxic, A & O x 3, very obese HEENT: Atraumatic, Normocephalic. Neck supple. No masses, No LAD. Ears and Nose: No external deformity. CV: RRR, No M/G/R. No JVD. No thrill. No extra  heart sounds. PULM: CTA B, no wheezes, crackles, rhonchi. No retractions. No resp. distress. No accessory muscle use. ABD: S, NT, ND EXTR: No c/c/e NEURO sitting in a WC, having a hard time standing due to back pain Normal LE strength, sensation and DTR bilaterally PSYCH: Normally interactive. Conversant. Not depressed or anxious appearing.  Calm demeanor.  Tenderness over bilateral lower back paraspinous muscles    Assessment and Plan: Bilateral low back pain without sciatica - Plan: cyclobenzaprine (FLEXERIL) 10 MG tablet, HYDROcodone-acetaminophen (NORCO/VICODIN) 5-325 MG per tablet  Here today with recurrent lower back pain, muscular in origin.  He has used flexeril and vicodin in the past, so will rx these for him again.  Cautioned regarding sedation and combination of these medications.    Signed Abbe Amsterdam, MD

## 2013-07-30 ENCOUNTER — Other Ambulatory Visit: Payer: Self-pay | Admitting: Family Medicine

## 2013-07-30 ENCOUNTER — Telehealth: Payer: Self-pay

## 2013-07-30 DIAGNOSIS — M545 Low back pain, unspecified: Secondary | ICD-10-CM

## 2013-07-30 NOTE — Telephone Encounter (Signed)
He was given 20 vicodin on 07/26/13.  Called him back.  He states his back is somewhat better, but not recovered yet.  He feels better when he is up and moving.  I will give him 15 more vicodin, but he will need a re-eval if more is required

## 2013-08-06 ENCOUNTER — Telehealth: Payer: Self-pay | Admitting: Internal Medicine

## 2013-08-06 NOTE — Telephone Encounter (Signed)
Spoke with patient and advised him to call [email or web address] for trouble shooting assistance.

## 2013-08-06 NOTE — Telephone Encounter (Signed)
New message     Talk to someone regarding checking his pacemaker

## 2013-08-13 ENCOUNTER — Encounter (HOSPITAL_COMMUNITY): Payer: Self-pay | Admitting: Vascular Surgery

## 2013-08-20 ENCOUNTER — Encounter (HOSPITAL_COMMUNITY): Payer: Medicare Other

## 2013-08-27 ENCOUNTER — Ambulatory Visit (INDEPENDENT_AMBULATORY_CARE_PROVIDER_SITE_OTHER): Payer: Medicare Other | Admitting: Family Medicine

## 2013-08-27 ENCOUNTER — Encounter: Payer: Self-pay | Admitting: Family Medicine

## 2013-08-27 VITALS — BP 120/76 | HR 72 | Temp 98.4°F | Resp 16 | Ht 69.5 in | Wt 290.4 lb

## 2013-08-27 DIAGNOSIS — Z91199 Patient's noncompliance with other medical treatment and regimen due to unspecified reason: Secondary | ICD-10-CM

## 2013-08-27 DIAGNOSIS — I4891 Unspecified atrial fibrillation: Secondary | ICD-10-CM

## 2013-08-27 DIAGNOSIS — I428 Other cardiomyopathies: Secondary | ICD-10-CM

## 2013-08-27 DIAGNOSIS — Z9119 Patient's noncompliance with other medical treatment and regimen: Secondary | ICD-10-CM

## 2013-08-27 DIAGNOSIS — E669 Obesity, unspecified: Secondary | ICD-10-CM

## 2013-08-27 DIAGNOSIS — E890 Postprocedural hypothyroidism: Secondary | ICD-10-CM

## 2013-08-27 DIAGNOSIS — Z139 Encounter for screening, unspecified: Secondary | ICD-10-CM

## 2013-08-27 DIAGNOSIS — I5022 Chronic systolic (congestive) heart failure: Secondary | ICD-10-CM

## 2013-08-27 DIAGNOSIS — Z1211 Encounter for screening for malignant neoplasm of colon: Secondary | ICD-10-CM

## 2013-08-27 DIAGNOSIS — I6990 Unspecified sequelae of unspecified cerebrovascular disease: Secondary | ICD-10-CM

## 2013-08-27 DIAGNOSIS — N183 Chronic kidney disease, stage 3 unspecified: Secondary | ICD-10-CM

## 2013-08-27 DIAGNOSIS — Z0000 Encounter for general adult medical examination without abnormal findings: Secondary | ICD-10-CM

## 2013-08-27 DIAGNOSIS — Z125 Encounter for screening for malignant neoplasm of prostate: Secondary | ICD-10-CM

## 2013-08-27 DIAGNOSIS — I480 Paroxysmal atrial fibrillation: Secondary | ICD-10-CM

## 2013-08-27 LAB — CBC WITH DIFFERENTIAL/PLATELET
BASOS PCT: 1 % (ref 0–1)
Basophils Absolute: 0.1 10*3/uL (ref 0.0–0.1)
Eosinophils Absolute: 0.2 10*3/uL (ref 0.0–0.7)
Eosinophils Relative: 2 % (ref 0–5)
HCT: 45 % (ref 39.0–52.0)
HEMOGLOBIN: 15.4 g/dL (ref 13.0–17.0)
LYMPHS ABS: 1.4 10*3/uL (ref 0.7–4.0)
Lymphocytes Relative: 15 % (ref 12–46)
MCH: 30.5 pg (ref 26.0–34.0)
MCHC: 34.2 g/dL (ref 30.0–36.0)
MCV: 89.1 fL (ref 78.0–100.0)
MONOS PCT: 10 % (ref 3–12)
Monocytes Absolute: 0.9 10*3/uL (ref 0.1–1.0)
NEUTROS PCT: 72 % (ref 43–77)
Neutro Abs: 6.5 10*3/uL (ref 1.7–7.7)
Platelets: 308 10*3/uL (ref 150–400)
RBC: 5.05 MIL/uL (ref 4.22–5.81)
RDW: 15.6 % — ABNORMAL HIGH (ref 11.5–15.5)
WBC: 9 10*3/uL (ref 4.0–10.5)

## 2013-08-27 LAB — IFOBT (OCCULT BLOOD): IFOBT: NEGATIVE

## 2013-08-27 NOTE — Progress Notes (Signed)
Subjective:    Patient ID: Raymond [name], male    DOB: 18-Jul-1960, 53 y.o.   MRN: [id number]  HPI Chief Complaint  Patient presents with  . Annual Exam   This chart was scribed for Ethelda Chick, MD by Andrew Au, ED Scribe. This patient was seen in room 21 and the patient's care was started at 4:15 PM.  HPI Comments: Raymond [name] is a 53 y.o. male who [facility].   Pt is unable to recall last physical. [facility] he [facility] with [facility] bleed.   Pneumonia shot 02/28/2013 Tetanus unknown. Flu vaccine 01/08/2013. Pt denies having an optometrist. Pt denies wearing glasses and contacts. Pt denies having a dentist. Pt see's cardiologist once a month.   Pt mother is 72/73. Pt takes care of his mother. Pt father died at 52 of unknown death. Father had TIA at 64. Pt has 2 sister. One has DM. Pt has a brother and is unsure of health problems.   Pt is a divorced. Pt has a male friend who he's known for 4 years. Pt denies having children.  Pt denies drinking and drugs. Pt denies exercise. Pt drives.  Pt has been disabled since TIA/CVA 4 years ago.  Pt ambulates with a cane. Pt reports after stroke he has been unable to use left hand and has some weakness left leg.   Pt weighs daily and is often at 288/289. Pt reports when his weight is up he has a hard time breathing. Pt denies falls in the last years. Pt is interested in activity but is unable to do the activities. Pt feels down and depressed at time bout the thing he's unable to due such as walk on the beach. He denies difficulty hears and seeing. Pt reports trouble with memory. Pt denies trouble bathing. Pt reports difficulty grocery shopping due to walking.   Pt denies Ha, tinnitus, mouth sores, dizziness and light headiness. Pt denies CP, cough, SOB. Pt reports BM daily. Pt reports black stools for the past 2 day. Pt denies frequent n/v/d. Pt reports leg swelling  with associated cyanosis with certain positions. Pt reports urinating 3-4 times at night due to taking fluid pills before bed. Pt denies trouble with erection and sex drive. Pt reports anxiety and nervousness due to constant bills.    Past Medical History  Diagnosis Date  . Non-ischemic cardiomyopathy     a. echo 11/10: EF 15%;   b. cath 10/08: normal cors;  c. Myoview 5/12: Very mild distal ant and apical isch, EF 17% (low risk-medical Rx cont'd);  d.  Echo 4/14:  EF 15%;  e. 05/2009 s/p SJM BiV ICD;  f. 07/2012 TEE: EF 20-25%, diff HK mild MR, mod TR.   Marland Kitchen Atrial fibrillation     a. on coumadin;  b. 07/2012 s/p TEE/DCCV. c. Recurrence 08/2012 in setting of abnormal thyroid panel.; 12/2012 AVN ablation by Dr [name]  . Hypertension   . Obesity   . Non-compliance   . Dyslexia     unable to read.  Marland Kitchen History of CVA (cerebrovascular accident) 11/2008    a. 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 RBBB (right bundle branch block)   . Cough syncope     a. During 08/2012 adm.  . Systolic CHF, chronic 01/19/1996  . Hypothyroidism 01/19/1996    s/p RAI therapy  . Stroke 01/19/2008  Past Surgical History  Procedure Laterality Date  . Bi-ventricular implantable cardioverter defibrillator  (crt-d)  06/11/09    [employer] [id number] BIVENTRICULAR AICD SERIAL #[id number]  . Knee arthroscopy Left ~ 1995  . Cardiac catheterization      "several time" (09/01/2012)  . Tee without cardioversion N/A 06/23/2012    Procedure: TRANSESOPHAGEAL ECHOCARDIOGRAM (TEE);  Surgeon: Vesta Mixer, MD;  Location[facility] ENDOSCOPY;  Service: Cardiovascular;  Laterality: N/A;  TEE will be done at 0730   . Cardioversion N/A 07/27/2012    Procedure: CARDIOVERSION;  Surgeon: Vesta Mixer, MD;  Location: [facility] ENDOSCOPY;  Service: Cardiovascular;  Laterality: N/A;  . Tee without cardioversion N/A 08/10/2012    Procedure: TRANSESOPHAGEAL ECHOCARDIOGRAM (TEE);  Surgeon: Wendall Stade, MD;  Location: [facility] ENDOSCOPY;   Service: Cardiovascular;  Laterality: N/A;  . Cardioversion N/A 08/10/2012    Procedure: CARDIOVERSION;  Surgeon: Wendall Stade, MD;  Location: [facility] ENDOSCOPY;  Service: Cardiovascular;  Laterality: N/A;  . Sp pta add intra cran  11/2008    a. 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 Ablation  01/09/2013    AVN ablation by Dr [name]   Prior to Admission medications   Medication Sig Start Date End Date Taking? Authorizing Provider  acyclovir (ZOVIRAX) 800 MG tablet Take 400 mg by mouth 2 (two) times daily. Take 1/2 tablet twice a day    Historical Provider, MD  ALPRAZolam (XANAX) 0.25 MG tablet Take 1 tablet (0.25 mg total) by mouth at bedtime as needed for anxiety. 04/16/13   Ethelda Chick, MD  carvedilol (COREG) 25 MG tablet Take 0.5 tablets (12.5 mg total) by mouth 2 (two) times daily with a meal. 04/02/13   Aundria Rud, NP  cyclobenzaprine (FLEXERIL) 10 MG tablet Take 1 tablet (10 mg total) by mouth 2 (two) times daily. 07/26/13   Gwenlyn Found Copland, MD  hydrALAZINE (APRESOLINE) 25 MG tablet Take 12.5 mg by mouth 3 (three) times daily. 05/30/13   Amy D Clegg, NP  HYDROcodone-acetaminophen (NORCO/VICODIN) 5-325 MG per tablet TAKE 1 TABLET BY MOUTH EVERY 8 HOURS AS NEEDED 07/30/13   Gwenlyn Found Copland, MD  isosorbide dinitrate (ISORDIL) 30 MG tablet Take 30 mg by mouth 4 (four) times daily.    Historical Provider, MD  levothyroxine (SYNTHROID, LEVOTHROID) 125 MCG tablet Take 250 mcg by mouth daily before breakfast.    Historical Provider, MD  levothyroxine (SYNTHROID, LEVOTHROID) 125 MCG tablet TAKE 2 TABLETS BY MOUTH ONCE DAILY 07/16/13   Romero Belling, MD  losartan (COZAAR) 25 MG tablet Take 25 mg by mouth daily.    Historical Provider, MD  metolazone (ZAROXOLYN) 2.5 MG tablet Take 2.5 mg on Monday, Wednesday and Friday or as needed. 03/14/13   Aundria Rud, NP  potassium chloride SA (KLOR-CON M20) 20 MEQ tablet Take 60 mEq by mouth at bedtime.    Historical Provider, MD    spironolactone (ALDACTONE) 12.5 mg TABS tablet Take 12.5 mg by mouth daily.    Historical Provider, MD  torsemide (DEMADEX) 20 MG tablet Take 3 tablets (60 mg total) by mouth daily. 03/14/13   Aundria Rud, NP  warfarin (COUMADIN) 7.5 MG tablet Take 7.5 mg by mouth daily at 6 PM.    Historical Provider, MD   History   Social History  . Marital Status: Legally Separated    Spouse Name: N/A    Number of Children: 0  . Years of Education: N/A  Occupational History  . DISABLED    Social History Main Topics  . Smoking status: Never Smoker   . Smokeless tobacco: Never Used  . Alcohol Use: No  . Drug Use: No  . Sexual Activity: Not Currently   Other Topics Concern  . Not on file   Social History Narrative   Marital status: divorced since 2014; married x 16 years; dating x 4 years since CVA.      Children: none      Lives: Lives alone in house in [place].        Employment:  Disabled from CVA in 2011 (L sided weakness).      Tobacco: none      Alcohol: rarely      Drugs: none      Exercise:  Sporadically.      ADLs: drives; independent with ADLs; girlfriend does grocery shopping; ambulates with cane.      Seatbelt: 100%       Family History  Problem Relation Age of Onset  . Heart disease Father 58    AMI  . Diabetes Father   . Stroke Father 28    CVA x 2  . Diabetes Sister   . Atrial fibrillation Sister   . Hyperlipidemia Mother   . Hypertension Mother   . Heart disease Mother 53    CHF    Review of Systems  Constitutional: Negative for fever, chills, diaphoresis, activity change, appetite change, fatigue and unexpected weight change.  HENT: Negative for congestion, dental problem, drooling, ear discharge, ear pain, facial swelling, hearing loss, mouth sores, nosebleeds, postnasal drip, rhinorrhea, sinus pressure, sneezing, sore throat, tinnitus, trouble swallowing and voice change.   Eyes: Negative for photophobia, pain, discharge, redness, itching and visual  disturbance.  Respiratory: Negative for apnea, cough, choking, chest tightness, shortness of breath, wheezing and stridor.   Cardiovascular: Negative for chest pain, palpitations and leg swelling.  Gastrointestinal: Negative for nausea, vomiting, abdominal pain, diarrhea, constipation and blood in stool.  Endocrine: Negative for cold intolerance, heat intolerance, polydipsia, polyphagia and polyuria.  Genitourinary: Negative for dysuria, urgency, frequency, hematuria, flank pain, decreased urine volume, discharge, penile swelling, scrotal swelling, enuresis, difficulty urinating, genital sores, penile pain and testicular pain.  Musculoskeletal: Positive for gait problem. Negative for arthralgias, back pain, joint swelling, myalgias, neck pain and neck stiffness.  Skin: Negative for color change, pallor, rash and wound.  Allergic/Immunologic: Negative for environmental allergies, food allergies and immunocompromised state.  Neurological: Positive for weakness. Negative for dizziness, tremors, seizures, syncope, facial asymmetry, speech difficulty, light-headedness, numbness and headaches.  Hematological: Negative for adenopathy. Does not bruise/bleed easily.  Psychiatric/Behavioral: Negative for suicidal ideas, hallucinations, behavioral problems, confusion, sleep disturbance, self-injury, dysphoric mood, decreased concentration and agitation. The patient is not nervous/anxious and is not hyperactive.   All other systems reviewed and are negative.   Objective:   Physical Exam  Nursing note and vitals reviewed. Constitutional: He is oriented to person, place, and time. He appears well-developed and well-nourished. No distress.  obese  HENT:  Head: Normocephalic and atraumatic.  Right Ear: External ear normal.  Left Ear: External ear normal.  Nose: Nose normal.  Mouth/Throat: Oropharynx is clear and moist.  Eyes: Conjunctivae and EOM are normal. Pupils are equal, round, and reactive to light.    Neck: Normal range of motion. Neck supple. Carotid bruit is not present. No thyromegaly present.  Cardiovascular: Normal rate, regular rhythm, normal heart sounds and intact distal pulses.  Exam reveals no gallop and  no friction rub.   No murmur heard. Pulmonary/Chest: Effort normal and breath sounds normal. No respiratory distress. He has no wheezes. He has no rales. He exhibits no tenderness.  Abdominal: Soft. Bowel sounds are normal. He exhibits no distension and no mass. There is no tenderness. There is no rebound and no guarding. Hernia confirmed negative in the right inguinal area and confirmed negative in the left inguinal area.  Genitourinary: Rectum normal, testes normal and penis normal. Prostate is enlarged ( slightly ). Circumcised.  Musculoskeletal: Normal range of motion.       Right shoulder: Normal.       Left shoulder: Normal.       Cervical back: Normal.  4/5 LUE 5/5 RUE  Lymphadenopathy:    He has no cervical adenopathy.  Neurological: He is alert and oriented to person, place, and time. He has normal reflexes. No cranial nerve deficit. He exhibits normal muscle tone. Coordination normal.  Skin: Skin is warm and dry. No rash noted. He is not diaphoretic.  Psychiatric: He has a normal mood and affect. His behavior is normal. Judgment and thought content normal.   Filed Vitals:   08/27/13 1313  BP: 120/76  Pulse: 72  Temp: 98.4 F (36.9 C)  Resp: 16   Assessment & Plan:   1. Routine general medical examination at a health care facility   2. 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 Other postablative hypothyroidism   4. OBESITY   5. Noncompliance   6. Non-ischemic cardiomyopathy   7. CKD (chronic kidney disease), stage III   8. Paroxysmal atrial fibrillation   9. Special screening for malignant neoplasm of prostate   10. Chronic systolic heart failure    1. Annual Wellness Examination:  Anticipatory guidance --- weight loss, exercise.  Clarify date of last colonoscopy.  No  hearing loss. No evidence of depression. Moderate fall risk.  Independent with ADLs; girlfriend does grocery shopping and administers and organizes medications for patient since struggles with reading.  Mother is good support as well. 2.  R CVA 2010 s/p TPA:  Stable; residual weakness present; maintained on Coumadin. 3.  Hypothyroidism: stable; obtain labs; continue current medications. 4.  Non-ischemic cardiomyopathy: stable; followed closely by cardiology. 5.  CKD: Stable; obtain labs.  If worsens, will warrant referral to nephrology.  Obtain u/a. 6.  Screening Prostate Cancer:  DRE and PSA completed.  7.  Paroxysmal atrial fibrillation:  Stable; maintained on coumadin, Coreg.  Followed closely by cardiology. 8. CHF:  Stable; weighs daily.  Maintained on Coreg, Imdur, Losartan,Zaroxolyn, Spironolactone, Demadex per cardiology. 9. Non-compliance: major issue for patient in past.  Emphasized importance in compliance; if continues to be an issue, [facility] Last admission 02/2013. 10. Colon cancer screening: clarify if colonoscopy has been completed; hemosure negative in office.  No orders of the defined types were placed in this encounter.     I personally performed the services described in this documentation, which was scribed in my presence.  The recorded information has been reviewed and is accurate.   Nilda SimmerKristi Curtis Uriarte, M.D.  [facility]  [place] [address] [place], [place]  [id number] [phone] phone [phone] fax

## 2013-08-28 LAB — COMPLETE METABOLIC PANEL WITH GFR
ALT: 17 U/L (ref 0–53)
AST: 25 U/L (ref 0–37)
Albumin: 4.2 g/dL (ref 3.5–5.2)
Alkaline Phosphatase: 104 U/L (ref 39–117)
BUN: 21 mg/dL (ref 6–23)
CALCIUM: 9.4 mg/dL (ref 8.4–10.5)
CHLORIDE: 101 meq/L (ref 96–112)
CO2: 23 mEq/L (ref 19–32)
CREATININE: 1.53 mg/dL — AB (ref 0.50–1.35)
GFR, EST AFRICAN AMERICAN: 60 mL/min
GFR, Est Non African American: 52 mL/min — ABNORMAL LOW
Glucose, Bld: 132 mg/dL — ABNORMAL HIGH (ref 70–99)
Potassium: 3.8 mEq/L (ref 3.5–5.3)
Sodium: 138 mEq/L (ref 135–145)
Total Bilirubin: 0.7 mg/dL (ref 0.2–1.2)
Total Protein: 6.8 g/dL (ref 6.0–8.3)

## 2013-08-28 LAB — PSA, MEDICARE: PSA: 0.27 ng/mL (ref <=4.00)

## 2013-08-28 LAB — TSH: TSH: 2.493 u[IU]/mL (ref 0.350–4.500)

## 2013-08-28 LAB — T4, FREE: FREE T4: 1.33 ng/dL (ref 0.80–1.80)

## 2013-08-28 NOTE — Progress Notes (Addendum)
Patient ID: Carloyn MannerRonald S Drew Jr, male   DOB: 07-Jan-1961, 53 y.o.   MRN: [id number]  [facility]  Primary Cardiologist: Dr. Antoine PocheHochrein Primary EP: Dr. 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   LHC in 2008 with normal cors. Myoview 05/2010: Very mild distal anterior and apical ischemia, EF 17% (low risk-medical therapy continued). Previously on Amiodarone and had very high TSH and was referred to endocrinology. He has had several admissions over the last 6 mos with recurrent atrial fibrillation with RVR and volume overload. He has undergone TEE-DCCV but had return of AFib. He was last admitted 12/21-12/24 with a/c systolic CHF in the setting of AFib with RVR and loss of BiV pacing. AV nodal ablation was recommended for control of refractory atrial fibrillation. He underwent this procedure with Dr. Ladona Ridgelaylor 01/09/13. Amiodarone was discontinued at that time.    Follow up for Heart Failure: Last visit asked patient to please start hydralazine 12.5 mg TID and Imdur 30 mg daily as instructed in the past, however patient has not clue what he is taking. SOB with minimal activity. Denies CP, PND or edema. + orthopnea (3 pillows) + fatigue. Misses medications frequently when he is out traveling. Weight at home 289 lbs. Complaining about why he has to see so many doctors. Difficulty with transportation. Walks with a cane. Can walk only a couple feet before getting SOB.   ECHO 05/02/13 EF 10%   Labs 04/02/13 K 4.1 Creatinine 1.52 Pro BNP 2869 Labs 06/06/13 K 3.5, creatinine 1.51 Labs 08/27/13: K 3.8, creatinine 1.53, TSH 2.493, Free T4 1.33  SH: Lives alone.  Mom drives him to appointments. Disabled.since 2010 . 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  Past Medical History  Diagnosis Date  . Non-ischemic cardiomyopathy     a. echo 11/10: EF 15%;   b. cath 10/08: normal cors;  c. Myoview 5/12: Very mild distal ant and apical isch, EF 17% (low risk-medical Rx cont'd);  d.  Echo 4/14:  EF 15%;  e. 05/2009 s/p SJM BiV ICD;  f. 07/2012 TEE: EF 20-25%, diff HK mild MR, mod TR.   Marland Kitchen. Atrial fibrillation     a. on coumadin;  b. 07/2012 s/p TEE/DCCV. c. Recurrence 08/2012 in setting of abnormal thyroid panel.; 12/2012 AVN ablation by Dr Ladona Ridgelaylor  . Hypertension   . Obesity   . Non-compliance   . Dyslexia   . 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 RBBB (right bundle branch block)   . Cough syncope     a. During 08/2012 adm.  . Systolic CHF, chronic 01/19/1996  . Hypothyroidism 01/19/1996    s/p RAI therapy  . Stroke 01/19/2008    Current Outpatient Prescriptions  Medication Sig Dispense Refill  . acyclovir (ZOVIRAX) 800 MG tablet Take 400 mg by mouth 2 (two) times daily. Take 1/2 tablet twice a day      . ALPRAZolam (XANAX) 0.25 MG tablet Take 1 tablet (0.25 mg total) by mouth at bedtime as needed for anxiety.  30 tablet  2  . carvedilol (COREG) 25 MG tablet Take  0.5 tablets (12.5 mg total) by mouth 2 (two) times daily with a meal.  30 tablet  3  . cyclobenzaprine (FLEXERIL) 10 MG tablet Take 1 tablet (10 mg total) by mouth 2 (two) times daily.  20 tablet  0  . hydrALAZINE (APRESOLINE) 25 MG tablet Take 12.5 mg by mouth 3 (three) times daily.      Marland Kitchen HYDROcodone-acetaminophen (NORCO/VICODIN) 5-325 MG per tablet TAKE 1 TABLET BY MOUTH EVERY 8 HOURS AS NEEDED  15 tablet  0  . isosorbide dinitrate (ISORDIL) 30 MG tablet Take 30 mg by mouth 4 (four) times daily.      Marland Kitchen levothyroxine (SYNTHROID, LEVOTHROID) 125 MCG tablet Take 250 mcg by mouth daily before breakfast.      . losartan (COZAAR) 25 MG tablet Take 25 mg by mouth daily.      . metolazone (ZAROXOLYN) 2.5 MG tablet Take 2.5  mg on Monday, Wednesday and Friday or as needed.  15 tablet  3  . potassium chloride SA (KLOR-CON M20) 20 MEQ tablet Take 60 mEq by mouth at bedtime.      Marland Kitchen spironolactone (ALDACTONE) 12.5 mg TABS tablet Take 12.5 mg by mouth daily.      Marland Kitchen torsemide (DEMADEX) 20 MG tablet Take 3 tablets (60 mg total) by mouth daily.  90 tablet  3  . warfarin (COUMADIN) 7.5 MG tablet Take 7.5 mg by mouth daily at 6 PM.       No current facility-administered medications for this encounter.    Filed Vitals:   08/29/13 1322  BP: 111/64  Pulse: 66  Resp: 18  Weight: 293 lb 4 oz (133.017 kg)  SpO2: 98%    PHYSICAL EXAM: General:  Chronically ill appearing. No resp difficulty; mom present. Ambulated in clinic with a cane.  HEENT: normal Neck: supple. JVP difficult to assess d/t body habitus but appears 8. Carotids 2+ bilaterally; no bruits. No lymphadenopathy or thryomegaly appreciated. Cor: PMI normal. Regular rate & rhythm. No rubs, gallops or murmurs. Lungs: clear Abdomen: Obese; soft, nontender, non distended. No hepatosplenomegaly. No bruits or masses. Good bowel sounds. Extremities: no cyanosis, clubbing, rash, no edema Neuro: alert & orientedx3, cranial nerves grossly intact.    ASSESSMENT & PLAN:  1) Chronic systolic HF: NICM s/p ST Jude CRT-D, ECHO EF 10% (04/2013) - Very difficult situation patient is disabled and can't use L arm. He reports he can't bring medications to visit because he can't carry them. Called pharmacy and they verified medications and appears that patient has not been taking hydralazine or Imdur since March. It also appears from fill dates that he does not take medications as prescribed. After lengthy discussion with patient also found out that he only takes torsemide 20 mg daily. Discussed with him it is difficult to try and help him feel better if 1) we don't know what medications he is taking and 2) if he doesn't take them as prescribed. Not sure how much we are going to be  able to do for him.  - NYHA IIIb symptoms and volume status mildly elevated. Would like him to take torsemide 40 mg daily. He has missed two days of diuretics because he has been at appointments. Continue metolazone 2.5 mg on Mondays, Wednesdays and Fridays.  - Will continue coreg 12.5 mg BID. Will not titrate with dizziness and fatigue. - Increase Spironolactone to 25 mg daily, recheck BMEt in 2 weeks.  - Reinforced the need and importance of daily weights, a low sodium diet, and  fluid restriction (less than 2 L a day). Instructed to call the HF clinic if weight increases more than 3 lbs overnight or 5 lbs in a week.  2) Afib- S/P AVN abalation by Dr [name] with most recent in December 2014.    On Coumadin. No bleeding problems.  Followed at [facility] coumadin clinic. 3) CKD, stage III-  Check BMET 2 weeks 4) HTN- Stable. Continue current medications.  5) Noncompliance- Reinforced medication compliance and daily weights  Over 40 minutes spent with 50% of that time spent in counseling about HF and coordination of care.   Follow up in 3 months.  Ulla Potash B NP-C 1:27 PM

## 2013-08-29 ENCOUNTER — Ambulatory Visit (HOSPITAL_COMMUNITY)
Admission: RE | Admit: 2013-08-29 | Discharge: 2013-08-29 | Disposition: A | Discharge status: Home / Self Care | Payer: Medicare Other | Source: Ambulatory Visit | Attending: Internal Medicine | Admitting: Internal Medicine

## 2013-08-29 ENCOUNTER — Encounter (HOSPITAL_COMMUNITY): Payer: Self-pay

## 2013-08-29 ENCOUNTER — Ambulatory Visit (INDEPENDENT_AMBULATORY_CARE_PROVIDER_SITE_OTHER): Payer: Medicare Other | Admitting: Pharmacist

## 2013-08-29 VITALS — BP 111/64 | HR 66 | Resp 18 | Wt 293.2 lb

## 2013-08-29 DIAGNOSIS — I5022 Chronic systolic (congestive) heart failure: Secondary | ICD-10-CM

## 2013-08-29 DIAGNOSIS — Z91199 Patient's noncompliance with other medical treatment and regimen due to unspecified reason: Secondary | ICD-10-CM | POA: Diagnosis not present

## 2013-08-29 DIAGNOSIS — Z7901 Long term (current) use of anticoagulants: Secondary | ICD-10-CM | POA: Insufficient documentation

## 2013-08-29 DIAGNOSIS — N183 Chronic kidney disease, stage 3 unspecified: Secondary | ICD-10-CM | POA: Insufficient documentation

## 2013-08-29 DIAGNOSIS — I509 Heart failure, unspecified: Secondary | ICD-10-CM | POA: Insufficient documentation

## 2013-08-29 DIAGNOSIS — E669 Obesity, unspecified: Secondary | ICD-10-CM | POA: Insufficient documentation

## 2013-08-29 DIAGNOSIS — I129 Hypertensive chronic kidney disease with stage 1 through stage 4 chronic kidney disease, or unspecified chronic kidney disease: Secondary | ICD-10-CM | POA: Insufficient documentation

## 2013-08-29 DIAGNOSIS — Z95810 Presence of automatic (implantable) cardiac defibrillator: Secondary | ICD-10-CM | POA: Insufficient documentation

## 2013-08-29 DIAGNOSIS — Z5181 Encounter for therapeutic drug level monitoring: Secondary | ICD-10-CM

## 2013-08-29 DIAGNOSIS — Z8673 Personal history of transient ischemic attack (TIA), and cerebral infarction without residual deficits: Secondary | ICD-10-CM | POA: Insufficient documentation

## 2013-08-29 DIAGNOSIS — Z9119 Patient's noncompliance with other medical treatment and regimen: Secondary | ICD-10-CM

## 2013-08-29 DIAGNOSIS — I4891 Unspecified atrial fibrillation: Secondary | ICD-10-CM

## 2013-08-29 DIAGNOSIS — I10 Essential (primary) hypertension: Secondary | ICD-10-CM

## 2013-08-29 LAB — POCT INR: INR: 1.4

## 2013-08-29 MED ORDER — SPIRONOLACTONE 25 MG PO TABS: 25.0000 mg | ORAL_TABLET | Freq: Every day | ORAL | Status: DC

## 2013-08-29 NOTE — Patient Instructions (Signed)
Increase your spironolactone to 25 mg (1 tablet) daily.  Stop hydralazine and Imdur since you are not taking daily.   Follow up in 3 months on the MD side.  Do the following things EVERYDAY: 1) Weigh yourself in the morning before breakfast. Write it down and keep it in a log. 2) Take your medicines as prescribed 3) Eat low salt foods-Limit salt (sodium) to 2000 mg per day.  4) Stay as active as you can everyday 5) Limit all fluids for the day to less than 2 liters 6)

## 2013-08-30 NOTE — Addendum Note (Signed)
Encounter addended by: Aundria Rud, NP on: 08/30/2013  7:36 AM<BR>     Documentation filed: Notes Section

## 2013-08-30 NOTE — Addendum Note (Signed)
Encounter addended by: Aundria Rud, NP on: 08/30/2013  7:38 AM<BR>     Documentation filed: Follow-up Section, LOS Section, Notes Section

## 2013-09-04 ENCOUNTER — Other Ambulatory Visit (HOSPITAL_COMMUNITY): Payer: Medicare Other

## 2013-09-10 ENCOUNTER — Other Ambulatory Visit: Payer: Self-pay | Admitting: Endocrinology

## 2013-09-10 ENCOUNTER — Other Ambulatory Visit: Payer: Self-pay | Admitting: Cardiology

## 2013-09-10 ENCOUNTER — Telehealth (HOSPITAL_COMMUNITY): Payer: Self-pay | Admitting: Vascular Surgery

## 2013-09-10 NOTE — Telephone Encounter (Signed)
Pt mother called wants to talk to someone about pt medication.. please advise

## 2013-09-11 ENCOUNTER — Ambulatory Visit (HOSPITAL_COMMUNITY)
Admission: RE | Admit: 2013-09-11 | Discharge: 2013-09-11 | Disposition: A | Discharge status: Home / Self Care | Payer: Medicare Other | Source: Ambulatory Visit | Attending: Cardiology | Admitting: Cardiology

## 2013-09-11 ENCOUNTER — Telehealth (HOSPITAL_COMMUNITY): Payer: Self-pay

## 2013-09-11 ENCOUNTER — Ambulatory Visit (INDEPENDENT_AMBULATORY_CARE_PROVIDER_SITE_OTHER): Payer: Medicare Other | Admitting: Pharmacist

## 2013-09-11 DIAGNOSIS — I509 Heart failure, unspecified: Secondary | ICD-10-CM | POA: Diagnosis not present

## 2013-09-11 DIAGNOSIS — I5022 Chronic systolic (congestive) heart failure: Secondary | ICD-10-CM | POA: Diagnosis not present

## 2013-09-11 DIAGNOSIS — I4891 Unspecified atrial fibrillation: Secondary | ICD-10-CM

## 2013-09-11 DIAGNOSIS — Z5181 Encounter for therapeutic drug level monitoring: Secondary | ICD-10-CM

## 2013-09-11 LAB — BASIC METABOLIC PANEL
Anion gap: 15 (ref 5–15)
BUN: 24 mg/dL — ABNORMAL HIGH (ref 6–23)
CO2: 25 meq/L (ref 19–32)
CREATININE: 1.31 mg/dL (ref 0.50–1.35)
Calcium: 9.7 mg/dL (ref 8.4–10.5)
Chloride: 97 mEq/L (ref 96–112)
GFR calc Af Amer: 72 mL/min — ABNORMAL LOW (ref >90)
GFR calc non Af Amer: 62 mL/min — ABNORMAL LOW (ref >90)
GLUCOSE: 133 mg/dL — AB (ref 70–99)
Potassium: 3.3 mEq/L — ABNORMAL LOW (ref 3.7–5.3)
Sodium: 137 mEq/L (ref 137–147)

## 2013-09-11 LAB — POCT INR: INR: 2.4

## 2013-09-11 NOTE — Telephone Encounter (Signed)
Mother of patient states he is still not able to take medications as prescribed due to frequent urination.  Will discuss when he comes in today for lab work. Ave Filter

## 2013-09-11 NOTE — Telephone Encounter (Signed)
Lab results reviewed with patient, instructed to take extra 40 meq potassium today.  Aware and agreeable. Bradley, Megan Genevea  

## 2013-09-12 ENCOUNTER — Encounter: Payer: Self-pay | Admitting: Internal Medicine

## 2013-09-15 ENCOUNTER — Encounter: Payer: Self-pay | Admitting: Family Medicine

## 2013-09-26 ENCOUNTER — Ambulatory Visit (INDEPENDENT_AMBULATORY_CARE_PROVIDER_SITE_OTHER): Payer: Medicare Other | Admitting: Pharmacist

## 2013-09-26 DIAGNOSIS — Z5181 Encounter for therapeutic drug level monitoring: Secondary | ICD-10-CM

## 2013-09-26 DIAGNOSIS — I4891 Unspecified atrial fibrillation: Secondary | ICD-10-CM

## 2013-09-26 LAB — POCT INR: INR: 2

## 2013-09-27 ENCOUNTER — Encounter: Payer: Self-pay | Admitting: Internal Medicine

## 2013-10-09 ENCOUNTER — Encounter: Payer: Self-pay | Admitting: Internal Medicine

## 2013-10-12 ENCOUNTER — Telehealth: Payer: Self-pay

## 2013-10-12 NOTE — Telephone Encounter (Signed)
Pt's mother called requesting a refill of his pain medication.

## 2013-10-15 ENCOUNTER — Other Ambulatory Visit (HOSPITAL_COMMUNITY): Payer: Self-pay

## 2013-10-15 MED ORDER — METOLAZONE 2.5 MG PO TABS: ORAL_TABLET | ORAL | Status: DC

## 2013-10-23 ENCOUNTER — Encounter: Payer: Medicare Other | Admitting: Internal Medicine

## 2013-10-24 ENCOUNTER — Encounter: Payer: Medicare Other | Admitting: Internal Medicine

## 2013-10-31 ENCOUNTER — Encounter: Payer: Self-pay | Admitting: Internal Medicine

## 2013-10-31 ENCOUNTER — Ambulatory Visit (INDEPENDENT_AMBULATORY_CARE_PROVIDER_SITE_OTHER): Payer: Medicare Other | Admitting: *Deleted

## 2013-10-31 DIAGNOSIS — I5023 Acute on chronic systolic (congestive) heart failure: Secondary | ICD-10-CM

## 2013-10-31 DIAGNOSIS — I428 Other cardiomyopathies: Secondary | ICD-10-CM

## 2013-10-31 DIAGNOSIS — I429 Cardiomyopathy, unspecified: Secondary | ICD-10-CM

## 2013-10-31 DIAGNOSIS — I482 Chronic atrial fibrillation, unspecified: Secondary | ICD-10-CM

## 2013-10-31 DIAGNOSIS — I4891 Unspecified atrial fibrillation: Secondary | ICD-10-CM

## 2013-10-31 DIAGNOSIS — Z5181 Encounter for therapeutic drug level monitoring: Secondary | ICD-10-CM

## 2013-10-31 LAB — POCT INR: INR: 3.2

## 2013-10-31 NOTE — Progress Notes (Signed)
Wound check appointment. Steri-strips removed. Wound without redness minimal edema noted.   Incision edges approximated, wound well healed. Normal device function. Thresholds, sensing, and impedances consistent with implant measurements.  Atrial threshold slightly higher than at implant but impedance and P-waves normal.   Device programmed at 3.5V/auto capture programmed on for extra safety margin until 3 month visit. Histogram distribution appropriate for patient and level of activity. 1 mode switche that appears to be T-wave oversensing.  No high ventricular rates noted. Patient educated about wound care, arm mobility, lifting restrictions. ROV in 3 months with implanting physician. 

## 2013-11-02 LAB — MDC_IDC_ENUM_SESS_TYPE_INCLINIC
Battery Remaining Longevity: 31 mo
Brady Statistic RV Percent Paced: 83 %
HighPow Impedance: 49 Ohm
Implantable Pulse Generator Serial Number: 610510
Lead Channel Impedance Value: 440 Ohm
Lead Channel Impedance Value: 700 Ohm
Lead Channel Pacing Threshold Amplitude: 0.5 V
Lead Channel Pacing Threshold Pulse Width: 0.5 ms
Lead Channel Pacing Threshold Pulse Width: 0.5 ms
Lead Channel Sensing Intrinsic Amplitude: 12 mV
Lead Channel Sensing Intrinsic Amplitude: 3.3 mV
Lead Channel Setting Pacing Amplitude: 2.5 V
Lead Channel Setting Pacing Pulse Width: 0.5 ms
Lead Channel Setting Pacing Pulse Width: 0.5 ms
MDC IDC MSMT LEADCHNL LV PACING THRESHOLD AMPLITUDE: 0.75 V
MDC IDC MSMT LEADCHNL RA IMPEDANCE VALUE: 400 Ohm
MDC IDC SET LEADCHNL LV PACING AMPLITUDE: 2 V
MDC IDC SET LEADCHNL RA PACING AMPLITUDE: 2 V
MDC IDC SET LEADCHNL RV SENSING SENSITIVITY: 0.5 mV
MDC IDC SET ZONE DETECTION INTERVAL: 250 ms
MDC IDC SET ZONE DETECTION INTERVAL: 280 ms
MDC IDC STAT BRADY RA PERCENT PACED: 1 %
Zone Setting Detection Interval: 455 ms

## 2013-11-12 ENCOUNTER — Telehealth: Payer: Self-pay | Admitting: *Deleted

## 2013-11-12 NOTE — Telephone Encounter (Signed)
Pt called stating had had bright bleeding when had bm yesterday and again had bright bleeding when had bm today . Instructed that we would be glad to check his INR today but if he is having that much bleeding felt better to go to ER  to have this checked out and pt states he will go to ER

## 2013-11-26 ENCOUNTER — Other Ambulatory Visit: Payer: Self-pay | Admitting: Endocrinology

## 2013-11-26 NOTE — Telephone Encounter (Signed)
Please refill x 1 Ov is due  

## 2013-11-26 NOTE — Telephone Encounter (Signed)
Please advise if ok to refill medication. Pt has not been seen since 10/25/2012. Thanks!

## 2013-11-26 NOTE — Telephone Encounter (Signed)
Rx sent to pharmacy   

## 2013-11-27 ENCOUNTER — Ambulatory Visit (INDEPENDENT_AMBULATORY_CARE_PROVIDER_SITE_OTHER): Payer: Medicare Other | Admitting: Surgery

## 2013-11-27 DIAGNOSIS — Z5181 Encounter for therapeutic drug level monitoring: Secondary | ICD-10-CM

## 2013-11-27 DIAGNOSIS — I4891 Unspecified atrial fibrillation: Secondary | ICD-10-CM

## 2013-11-27 LAB — POCT INR: INR: 2

## 2013-11-28 ENCOUNTER — Encounter (HOSPITAL_COMMUNITY): Payer: Medicare Other

## 2013-11-29 ENCOUNTER — Encounter (HOSPITAL_COMMUNITY): Payer: Medicare Other

## 2013-12-24 ENCOUNTER — Other Ambulatory Visit: Payer: Self-pay | Admitting: Cardiology

## 2013-12-26 ENCOUNTER — Ambulatory Visit (INDEPENDENT_AMBULATORY_CARE_PROVIDER_SITE_OTHER): Payer: Medicare Other | Admitting: *Deleted

## 2013-12-26 ENCOUNTER — Ambulatory Visit (HOSPITAL_COMMUNITY)
Admission: RE | Admit: 2013-12-26 | Discharge: 2013-12-26 | Disposition: A | Discharge status: Home / Self Care | Payer: Medicare Other | Source: Ambulatory Visit | Attending: Cardiology | Admitting: Cardiology

## 2013-12-26 DIAGNOSIS — Z79899 Other long term (current) drug therapy: Secondary | ICD-10-CM | POA: Insufficient documentation

## 2013-12-26 DIAGNOSIS — I129 Hypertensive chronic kidney disease with stage 1 through stage 4 chronic kidney disease, or unspecified chronic kidney disease: Secondary | ICD-10-CM | POA: Insufficient documentation

## 2013-12-26 DIAGNOSIS — Z7901 Long term (current) use of anticoagulants: Secondary | ICD-10-CM | POA: Diagnosis not present

## 2013-12-26 DIAGNOSIS — E039 Hypothyroidism, unspecified: Secondary | ICD-10-CM | POA: Insufficient documentation

## 2013-12-26 DIAGNOSIS — I4891 Unspecified atrial fibrillation: Secondary | ICD-10-CM | POA: Insufficient documentation

## 2013-12-26 DIAGNOSIS — N183 Chronic kidney disease, stage 3 unspecified: Secondary | ICD-10-CM

## 2013-12-26 DIAGNOSIS — I429 Cardiomyopathy, unspecified: Secondary | ICD-10-CM | POA: Insufficient documentation

## 2013-12-26 DIAGNOSIS — Z8249 Family history of ischemic heart disease and other diseases of the circulatory system: Secondary | ICD-10-CM | POA: Insufficient documentation

## 2013-12-26 DIAGNOSIS — I480 Paroxysmal atrial fibrillation: Secondary | ICD-10-CM | POA: Diagnosis not present

## 2013-12-26 DIAGNOSIS — Z8673 Personal history of transient ischemic attack (TIA), and cerebral infarction without residual deficits: Secondary | ICD-10-CM | POA: Insufficient documentation

## 2013-12-26 DIAGNOSIS — Z5181 Encounter for therapeutic drug level monitoring: Secondary | ICD-10-CM

## 2013-12-26 DIAGNOSIS — I5022 Chronic systolic (congestive) heart failure: Secondary | ICD-10-CM | POA: Insufficient documentation

## 2013-12-26 LAB — PRO B NATRIURETIC PEPTIDE: Pro B Natriuretic peptide (BNP): 4324 pg/mL — ABNORMAL HIGH (ref 0–125)

## 2013-12-26 LAB — BASIC METABOLIC PANEL
ANION GAP: 19 — AB (ref 5–15)
BUN: 23 mg/dL (ref 6–23)
CHLORIDE: 97 meq/L (ref 96–112)
CO2: 23 meq/L (ref 19–32)
CREATININE: 1.4 mg/dL — AB (ref 0.50–1.35)
Calcium: 9.9 mg/dL (ref 8.4–10.5)
GFR calc Af Amer: 66 mL/min — ABNORMAL LOW (ref >90)
GFR calc non Af Amer: 57 mL/min — ABNORMAL LOW (ref >90)
GLUCOSE: 116 mg/dL — AB (ref 70–99)
Potassium: 3.4 mEq/L — ABNORMAL LOW (ref 3.7–5.3)
Sodium: 139 mEq/L (ref 137–147)

## 2013-12-26 LAB — POCT INR: INR: 1.9

## 2013-12-26 MED ORDER — CARVEDILOL 6.25 MG PO TABS: 18.7500 mg | ORAL_TABLET | Freq: Two times a day (BID) | ORAL | Status: DC

## 2013-12-26 NOTE — Progress Notes (Signed)
Patient ID: Raymond [name], male   DOB: [birth year], 53 y.o.   MRN: [id number] [facility] Katrinka Blazing  Primary EP: Dr. 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   LHC in 2008 with normal cors. Myoview 05/2010: Very mild distal anterior and apical ischemia, EF 17% (low risk-medical therapy continued). Previously on Amiodarone and had very high TSH and was referred to endocrinology. He has had several admissions over the last 6 mos with recurrent atrial fibrillation with RVR and volume overload. He has undergone TEE-DCCV but had return of AFib. He was last admitted 12/21-12/24 with a/c systolic CHF in the setting of AFib with RVR and loss of BiV pacing. AV nodal ablation was recommended for control of refractory atrial fibrillation. He underwent this procedure with Dr. [name] 01/09/13. Amiodarone was discontinued at that time.    Raymond [name] has had some trouble with medication noncompliance.  He has dyslexia and has trouble reading his medication bottles. His mother tries to help.  He thinks he is now taking everything on his list.  He has been having chronic headaches (several times a week).  He fatigues easily and short of breath after walking 100 feet.  This is stable.  He does ok walking in the house.  He sleeps on 3 pillows chronically.  No chest pain.  Rare palpitations.  Weight is down 10 lbs.   ECHO 05/02/13 EF 10%   Labs 04/02/13: K 4.1 Creatinine 1.52 Pro BNP 2869 Labs 06/06/13: K 3.5, creatinine 1.51 Labs 8/15: K 3.8 => 3.3, creatinine 1.53 => 1.31, TSH 2.493, Free T4 1.33, HCT 45  ECG: Atrial fibrillation with regular v-pacing at 70  SH: Lives alone.  Mom drives him to appointments. 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  Past Medical History  Diagnosis Date  . Non-ischemic cardiomyopathy     a. echo 11/10: EF 15%;   b. cath 10/08: normal cors;  c. Myoview 5/12: Very mild distal ant and apical isch, EF 17% (low risk-medical Rx cont'd);  d.  Echo 4/14:  EF 15%;  e. 05/2009 s/p SJM BiV ICD;  f. 07/2012 TEE: EF 20-25%, diff HK mild Raymond, mod TR.   Marland Kitchen Atrial fibrillation     a. on coumadin;  b. 07/2012 s/p TEE/DCCV. c. Recurrence 08/2012 in setting of abnormal thyroid panel.; 12/2012 AVN ablation by Dr [name]  . Hypertension   . Obesity   . Non-compliance   . Dyslexia     unable to read.  Marland Kitchen History of CVA (cerebrovascular accident) 11/2008    a. 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 RBBB (right bundle branch block)   . Cough syncope     a. During 08/2012 adm.  . Systolic CHF, chronic 01/19/1996  . Hypothyroidism 01/19/1996    s/p RAI therapy  . Stroke 01/19/2008    Current Outpatient Prescriptions  Medication Sig Dispense Refill  . acyclovir (ZOVIRAX) 800 MG tablet Take 400 mg by mouth 2 (two) times daily. Take 1/2 tablet twice a day    . ALPRAZolam (XANAX) 0.25 MG tablet Take 1 tablet (0.25 mg total) by mouth at bedtime  as needed for anxiety. 30 tablet 2  . carvedilol (COREG) 6.25 MG tablet Take 3 tablets (18.75 mg total) by mouth 2 (two) times daily with a meal. 180 tablet 3  . cyclobenzaprine (FLEXERIL) 10 MG tablet Take 1 tablet (10 mg total) by mouth 2 (two) times daily. 20 tablet 0  . HYDROcodone-acetaminophen (NORCO/VICODIN) 5-325 MG per tablet TAKE 1 TABLET BY MOUTH EVERY 8 HOURS AS NEEDED 15 tablet 0  . levothyroxine (SYNTHROID, LEVOTHROID) 125 MCG tablet Take 250 mcg by mouth daily before breakfast.    . losartan (COZAAR) 50 MG tablet TAKE 1 TABLET BY MOUTH ONCE DAILY 30 tablet 6  . metolazone (ZAROXOLYN) 2.5 MG tablet Take 2.5 mg on Monday, Wednesday and Friday or as needed. 15 tablet 3  . potassium chloride SA (KLOR-CON M20) 20 MEQ tablet Take 60 mEq by mouth at bedtime.     Marland Kitchen. spironolactone (ALDACTONE) 25 MG tablet Take 1 tablet (25 mg total) by mouth daily. 90 tablet 3  . torsemide (DEMADEX) 20 MG tablet Take 3 tablets (60 mg total) by mouth daily. 90 tablet 3  . warfarin (COUMADIN) 7.5 MG tablet Take 7.5 mg by mouth daily at 6 PM.    . warfarin (COUMADIN) 7.5 MG tablet Take 1 to 1.5 tablets by mouth daily as directed by coumadin clinic 40 tablet 3   No current facility-administered medications for this encounter.    Filed Vitals:   12/26/13 1135  BP: 114/66  Pulse: 69  Weight: 283 lb 12 oz (128.708 kg)  SpO2: 98%    PHYSICAL EXAM: General:  Chronically ill appearing. No resp difficulty; mom present. Ambulated in clinic with a cane.  HEENT: normal Neck: supple. No JVD. Carotids 2+ bilaterally; no bruits. No lymphadenopathy or thryomegaly appreciated. Cor: PMI normal. Regular rate & rhythm. No rubs, gallops or murmurs. Lungs: clear Abdomen: Obese; soft, nontender, non distended. No hepatosplenomegaly. No bruits or masses. Good bowel sounds. Extremities: no cyanosis, clubbing, rash, no edema Neuro: alert & orientedx3, cranial nerves grossly intact.    ASSESSMENT & PLAN:  1) Chronic systolic HF: Nonischemic cardiomyopathy, s/p ST Jude CRT-D, ECHO EF 10% (04/2013). NYHA class III symptoms, stable.  Weight is down 10 lbs.  He seems to be taking his meds as ordered currently.   - He is not taking hydralazine but is taking isordil and has frequent headaches.  I do not think that isordil is doing him much good in this situation so will have him stop it.  - He will continue current losartan and spironolactone.  - Increase Coreg to 18.75 mg bid.  - Continue current torsemide with metolazone 3 times/week.  - I would like to see him try to be more active.  I recommended that he go back to water aerobics. I will also refer him to cardiac rehab.  If this can be covered by his insurance, I would like him to go through the program.  - BMET/BNP today.  2) Afib:  Chronic. S/p AV nodal ablation.  On coumadin with no bleeding problems.     3) CKD, stage III: BMET today. 4) HTN: Stable. Continue current medications.   Raymond [name] 12/26/2013

## 2013-12-26 NOTE — Patient Instructions (Addendum)
INCREASE Coreg to 18.75 mg twice a day STOP Isosorbide   Labs today  Your physician recommends that you schedule a follow-up appointment in: 2 months  Do the following things EVERYDAY: 1) Weigh yourself in the morning before breakfast. Write it down and keep it in a log. 2) Take your medicines as prescribed 3) Eat low salt foods-Limit salt (sodium) to 2000 mg per day.  4) Stay as active as you can everyday 5) Limit all fluids for the day to less than 2 liters 6)

## 2013-12-27 ENCOUNTER — Telehealth: Payer: Self-pay

## 2013-12-27 ENCOUNTER — Encounter (HOSPITAL_COMMUNITY): Payer: Self-pay | Admitting: Internal Medicine

## 2013-12-27 NOTE — Telephone Encounter (Signed)
Spoke to patient and reminded him to get his flu shot.  He said he would do so. 

## 2014-01-24 ENCOUNTER — Telehealth (HOSPITAL_COMMUNITY): Payer: Self-pay | Admitting: *Deleted

## 2014-01-30 ENCOUNTER — Encounter: Payer: Self-pay | Admitting: Internal Medicine

## 2014-01-30 ENCOUNTER — Ambulatory Visit (INDEPENDENT_AMBULATORY_CARE_PROVIDER_SITE_OTHER): Payer: Medicare Other | Admitting: Internal Medicine

## 2014-01-30 ENCOUNTER — Ambulatory Visit (INDEPENDENT_AMBULATORY_CARE_PROVIDER_SITE_OTHER): Payer: Medicare Other | Admitting: *Deleted

## 2014-01-30 VITALS — BP 100/66 | HR 53 | Ht 69.0 in | Wt 275.0 lb

## 2014-01-30 DIAGNOSIS — I5022 Chronic systolic (congestive) heart failure: Secondary | ICD-10-CM

## 2014-01-30 DIAGNOSIS — Z95810 Presence of automatic (implantable) cardiac defibrillator: Secondary | ICD-10-CM

## 2014-01-30 DIAGNOSIS — Z5181 Encounter for therapeutic drug level monitoring: Secondary | ICD-10-CM

## 2014-01-30 DIAGNOSIS — I482 Chronic atrial fibrillation, unspecified: Secondary | ICD-10-CM

## 2014-01-30 DIAGNOSIS — I429 Cardiomyopathy, unspecified: Secondary | ICD-10-CM

## 2014-01-30 DIAGNOSIS — I472 Ventricular tachycardia: Secondary | ICD-10-CM

## 2014-01-30 DIAGNOSIS — I428 Other cardiomyopathies: Secondary | ICD-10-CM

## 2014-01-30 DIAGNOSIS — I5023 Acute on chronic systolic (congestive) heart failure: Secondary | ICD-10-CM

## 2014-01-30 DIAGNOSIS — I4891 Unspecified atrial fibrillation: Secondary | ICD-10-CM

## 2014-01-30 DIAGNOSIS — I4729 Other ventricular tachycardia: Secondary | ICD-10-CM

## 2014-01-30 DIAGNOSIS — I480 Paroxysmal atrial fibrillation: Secondary | ICD-10-CM

## 2014-01-30 LAB — MDC_IDC_ENUM_SESS_TYPE_INCLINIC
Battery Remaining Longevity: 33.6 mo
Brady Statistic RA Percent Paced: 0.03 %
Brady Statistic RV Percent Paced: 83 %
Date Time Interrogation Session: 20160113110343
HIGH POWER IMPEDANCE MEASURED VALUE: 55.2273
Implantable Pulse Generator Serial Number: 610510
Lead Channel Pacing Threshold Amplitude: 1 V
Lead Channel Pacing Threshold Pulse Width: 0.5 ms
Lead Channel Setting Pacing Amplitude: 2 V
Lead Channel Setting Pacing Amplitude: 2.5 V
Lead Channel Setting Sensing Sensitivity: 0.5 mV
MDC IDC MSMT LEADCHNL LV IMPEDANCE VALUE: 750 Ohm
MDC IDC MSMT LEADCHNL LV PACING THRESHOLD PULSEWIDTH: 0.5 ms
MDC IDC MSMT LEADCHNL RA IMPEDANCE VALUE: 437.5 Ohm
MDC IDC MSMT LEADCHNL RA SENSING INTR AMPL: 2 mV
MDC IDC MSMT LEADCHNL RV IMPEDANCE VALUE: 487.5 Ohm
MDC IDC MSMT LEADCHNL RV PACING THRESHOLD AMPLITUDE: 0.5 V
MDC IDC MSMT LEADCHNL RV SENSING INTR AMPL: 12 mV
MDC IDC SET LEADCHNL LV PACING PULSEWIDTH: 0.5 ms
MDC IDC SET LEADCHNL RA PACING AMPLITUDE: 2 V
MDC IDC SET LEADCHNL RV PACING PULSEWIDTH: 0.5 ms
Zone Setting Detection Interval: 250 ms
Zone Setting Detection Interval: 280 ms
Zone Setting Detection Interval: 455 ms

## 2014-01-30 LAB — POCT INR: INR: 1.6

## 2014-01-30 NOTE — Progress Notes (Signed)
HPI [name] returns today for followup. He is a pleasant 54 yo man with a h/o an DCM, chronic class 3 systolic CHF, persistent atrial fibrillation with an uncontrolled VR, who underwent AV node ablation over a year ago. The patient has had multiple hospitalizations.  He has had problems with sodium indiscretion and medical non-compliance in the past. He has tried to reduce his sodium intake recently.  He has had a cough with white phlegm. No clear fever or chills. Allergies  Allergen Reactions  . Valsartan Cough     Current Outpatient Prescriptions  Medication Sig Dispense Refill  . acyclovir (ZOVIRAX) 800 MG tablet Take 400 mg by mouth 2 (two) times daily.     Marland Kitchen ALPRAZolam (XANAX) 0.25 MG tablet Take 1 tablet (0.25 mg total) by mouth at bedtime as needed for anxiety. 30 tablet 2  . carvedilol (COREG) 6.25 MG tablet Take 3 tablets (18.75 mg total) by mouth 2 (two) times daily with a meal. 180 tablet 3  . cyclobenzaprine (FLEXERIL) 10 MG tablet Take 1 tablet (10 mg total) by mouth 2 (two) times daily. 20 tablet 0  . HYDROcodone-acetaminophen (NORCO/VICODIN) 5-325 MG per tablet TAKE 1 TABLET BY MOUTH EVERY 8 HOURS AS NEEDED (Patient taking differently: TAKE 1 TABLET BY MOUTH EVERY 8 HOURS AS NEEDED FOR PAIN) 15 tablet 0  . levothyroxine (SYNTHROID, LEVOTHROID) 125 MCG tablet Take 250 mcg by mouth daily before breakfast.    . losartan (COZAAR) 50 MG tablet TAKE 1 TABLET BY MOUTH ONCE DAILY 30 tablet 6  . metolazone (ZAROXOLYN) 2.5 MG tablet Take 2.5 mg on Monday, Wednesday and Friday or as needed. (Patient taking differently: Take 2.5 mg by mouth on Monday, Wednesday and Friday or daily as needed for swelling) 15 tablet 3  . potassium chloride SA (KLOR-CON M20) 20 MEQ tablet Take 60 mEq by mouth at bedtime.    Marland Kitchen spironolactone (ALDACTONE) 25 MG tablet Take 1 tablet (25 mg total) by mouth daily. 90 tablet 3  . torsemide (DEMADEX) 20 MG tablet Take 3 tablets (60 mg total) by mouth daily. 90  tablet 3  . warfarin (COUMADIN) 7.5 MG tablet Take 1 to 1.5 tablets by mouth daily as directed by coumadin clinic 40 tablet 3   No current facility-administered medications for this visit.     Past Medical History  Diagnosis Date  . Non-ischemic cardiomyopathy     a. echo 11/10: EF 15%;   b. cath 10/08: normal cors;  c. Myoview 5/12: Very mild distal ant and apical isch, EF 17% (low risk-medical Rx cont'd);  d.  Echo 4/14:  EF 15%;  e. 05/2009 s/p SJM BiV ICD;  f. 07/2012 TEE: EF 20-25%, diff HK mild MR, mod TR.   Marland Kitchen Atrial fibrillation     a. on coumadin;  b. 07/2012 s/p TEE/DCCV. c. Recurrence 08/2012 in setting of abnormal thyroid panel.; 12/2012 AVN ablation by Dr [name]  . Hypertension   . Obesity   . Non-compliance   . Dyslexia     unable to read.  Marland Kitchen History of CVA (cerebrovascular accident) 11/2008    a. 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 RBBB (right bundle branch block)   . Cough syncope     a. During 08/2012 adm.  . Systolic CHF, chronic 01/19/1996  . Hypothyroidism 01/19/1996    s/p RAI therapy  . Stroke 01/19/2008    ROS:   All systems reviewed and negative except  as noted in the HPI.   Past Surgical History  Procedure Laterality Date  . Bi-ventricular implantable cardioverter defibrillator  (crt-d)  06/11/09    [employer] UN[employer] [id number] BIVENTRICULAR AICD SERIAL #[id number]  . Knee arthroscopy Left ~ 1995  . Cardiac catheterization      "several time" (09/01/2012)  . Tee without cardioversion N/A 06/23/2012    Procedure: TRANSESOPHAGEAL ECHOCARDIOGRAM (TEE);  Surgeon: Vesta Mixer, MD;  Location: [facility] ENDOSCOPY;  Service: Cardiovascular;  Laterality: N/A;  TEE will be done at 0730   . Cardioversion N/A 07/27/2012    Procedure: CARDIOVERSION;  Surgeon: Vesta Mixer, MD;  Location: [facility] ENDOSCOPY;  Service: Cardiovascular;  Laterality: N/A;  . Tee without cardioversion N/A 08/10/2012    Procedure: TRANSESOPHAGEAL ECHOCARDIOGRAM (TEE);  Surgeon: Wendall Stade, MD;  Location: [facility] ENDOSCOPY;  Service: Cardiovascular;  Laterality: N/A;  . Cardioversion N/A 08/10/2012    Procedure: CARDIOVERSION;  Surgeon: Wendall Stade, MD;  Location: [facility] ENDOSCOPY;  Service: Cardiovascular;  Laterality: N/A;  . Sp pta add intra cran  11/2008    a. 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 Ablation  01/09/2013    AVN ablation by Dr [name]  . Av node ablation N/A 01/09/2013    Procedure: AV NODE ABLATION;  Surgeon: Marinus Maw, MD;  Location: [facility] CATH LAB;  Service: Cardiovascular;  Laterality: N/A;     Family History  Problem Relation Age of Onset  . Heart disease Father 32    AMI  . Diabetes Father   . Stroke Father 66    CVA x 2  . Diabetes Sister   . Atrial fibrillation Sister   . Hyperlipidemia Mother   . Hypertension Mother   . Heart disease Mother 80    CHF     History   Social History  . Marital Status: Legally Separated    Spouse Name: N/A    Number of Children: 0  . Years of Education: N/A   Occupational History  . DISABLED    Social History Main Topics  . Smoking status: Never Smoker   . Smokeless tobacco: Never Used  . Alcohol Use: No  . Drug Use: No  . Sexual Activity: Not Currently   Other Topics Concern  . Not on file   Social History Narrative   Marital status: divorced since 2014; married x 16 years; dating x 4 years since CVA.      Children: none      Lives: Lives alone in house in [place].        Employment:  Disabled from CVA in 2011 (L sided weakness).      Tobacco: none      Alcohol: rarely      Drugs: none      Exercise:  Sporadically.      ADLs: drives; independent with ADLs; girlfriend does grocery shopping; ambulates with cane.      Seatbelt: 100%         BP 100/66 mmHg  Pulse 53  Ht  (1.753 m)  Wt 275 lb (124.739 kg)  BMI 40.59 kg/m2  Physical Exam:  Chronically ill appearing middle aged woman, NAD HEENT: Unremarkable Neck:  No JVD, no  thyromegally Lymphatics:  No adenopathy Back:  No CVA tenderness Lungs:  Clear with no wheezes HEART:  Regular rate rhythm, no murmurs, no rubs, no clicks Abd:  soft, positive bowel sounds, no organomegally, no rebound, no guarding Ext:  2 plus pulses, no edema, no cyanosis, no clubbing Skin:  No rashes no nodules Neuro:  CN II through XII intact, motor grossly intact  DEVICE  Normal device function.  See PaceArt for details.   Assess/Plan:

## 2014-01-30 NOTE — Assessment & Plan Note (Signed)
Amio was stopped over a year ago and he has been out of rhythm for 9 months. I considered Tikosyn but the patient cannot pay for his scripts. He will continue a strategy of rate control, s/p AV node ablation.

## 2014-01-30 NOTE — Assessment & Plan Note (Signed)
His [employer]. Jude BiV ICD is working normally. Will follow.

## 2014-01-30 NOTE — Assessment & Plan Note (Signed)
His symptoms remain class 3. He will followup in the CHF clinic. I have asked him to reduce his sodium intake. I do not have any additional treatment options.

## 2014-01-30 NOTE — Assessment & Plan Note (Signed)
No sustained VT. He will continue his current meds. No anti-arryhthmic drug therapy is indicated at this time.

## 2014-01-30 NOTE — Patient Instructions (Signed)
Your physician wants you to follow-up in: 12 months with Dr. [name]. You will receive a reminder letter in the mail two months in advance. If you don't receive a letter, please call our office to schedule the follow-up appointment.  Your physician recommends that you continue on your current medications as directed. Please refer to the Current Medication list given to you today.  Remote monitoring is used to monitor your Pacemaker or ICD from home. This monitoring reduces the number of office visits required to check your device to one time per year. It allows [place] to keep an eye on the functioning of your device to ensure it is working properly. You are scheduled for a device check from home on 05/01/2014. You may send your transmission at any time that day. If you have a wireless device, the transmission will be sent automatically. After your physician reviews your transmission, you will receive a postcard with your next transmission date.

## 2014-01-31 ENCOUNTER — Encounter (HOSPITAL_COMMUNITY): Payer: Self-pay | Admitting: Cardiology

## 2014-02-04 ENCOUNTER — Other Ambulatory Visit (HOSPITAL_COMMUNITY): Payer: Self-pay

## 2014-02-04 ENCOUNTER — Telehealth (HOSPITAL_COMMUNITY): Payer: Self-pay

## 2014-02-04 MED ORDER — METOLAZONE 2.5 MG PO TABS: ORAL_TABLET | ORAL | Status: DC

## 2014-02-04 NOTE — Telephone Encounter (Signed)
Patient's mother called c/o metolazone.  States patient does not think this is covered by insurance anymore.  In talking with his pharmacy, there is an initial deduct able each year that must be met until his normal copay kicks in.  For now, 30day supply in $20, but otherwise medication is still covered.  Patient's mother made aware, Rx refills sent into preferred pharmacy electronically.  Aware and agreeable.  Renee Pain

## 2014-02-14 ENCOUNTER — Emergency Department (HOSPITAL_COMMUNITY): Payer: Medicare Other

## 2014-02-14 ENCOUNTER — Encounter (HOSPITAL_COMMUNITY): Payer: Self-pay | Admitting: Cardiology

## 2014-02-14 ENCOUNTER — Inpatient Hospital Stay (HOSPITAL_COMMUNITY)
Admission: EM | Admit: 2014-02-14 | Discharge: 2014-02-22 | DRG: 286 | Disposition: A | Discharge status: Home Health | Payer: Medicare Other | Attending: Cardiology | Admitting: Cardiology

## 2014-02-14 DIAGNOSIS — I69354 Hemiplegia and hemiparesis following cerebral infarction affecting left non-dominant side: Secondary | ICD-10-CM

## 2014-02-14 DIAGNOSIS — Z9114 Patient's other noncompliance with medication regimen: Secondary | ICD-10-CM | POA: Diagnosis present

## 2014-02-14 DIAGNOSIS — Z9119 Patient's noncompliance with other medical treatment and regimen: Secondary | ICD-10-CM | POA: Diagnosis present

## 2014-02-14 DIAGNOSIS — I429 Cardiomyopathy, unspecified: Secondary | ICD-10-CM | POA: Diagnosis present

## 2014-02-14 DIAGNOSIS — Z95828 Presence of other vascular implants and grafts: Secondary | ICD-10-CM

## 2014-02-14 DIAGNOSIS — I509 Heart failure, unspecified: Secondary | ICD-10-CM

## 2014-02-14 DIAGNOSIS — Z833 Family history of diabetes mellitus: Secondary | ICD-10-CM | POA: Diagnosis not present

## 2014-02-14 DIAGNOSIS — Z8249 Family history of ischemic heart disease and other diseases of the circulatory system: Secondary | ICD-10-CM

## 2014-02-14 DIAGNOSIS — R570 Cardiogenic shock: Secondary | ICD-10-CM | POA: Diagnosis present

## 2014-02-14 DIAGNOSIS — I482 Chronic atrial fibrillation: Secondary | ICD-10-CM | POA: Diagnosis present

## 2014-02-14 DIAGNOSIS — G4729 Other circadian rhythm sleep disorder: Secondary | ICD-10-CM | POA: Diagnosis not present

## 2014-02-14 DIAGNOSIS — R05 Cough: Secondary | ICD-10-CM | POA: Diagnosis present

## 2014-02-14 DIAGNOSIS — I5022 Chronic systolic (congestive) heart failure: Secondary | ICD-10-CM

## 2014-02-14 DIAGNOSIS — R0600 Dyspnea, unspecified: Secondary | ICD-10-CM | POA: Diagnosis present

## 2014-02-14 DIAGNOSIS — I272 Other secondary pulmonary hypertension: Secondary | ICD-10-CM | POA: Diagnosis present

## 2014-02-14 DIAGNOSIS — R0602 Shortness of breath: Secondary | ICD-10-CM

## 2014-02-14 DIAGNOSIS — Z608 Other problems related to social environment: Secondary | ICD-10-CM | POA: Diagnosis present

## 2014-02-14 DIAGNOSIS — Z515 Encounter for palliative care: Secondary | ICD-10-CM

## 2014-02-14 DIAGNOSIS — Z599 Problem related to housing and economic circumstances, unspecified: Secondary | ICD-10-CM

## 2014-02-14 DIAGNOSIS — Z823 Family history of stroke: Secondary | ICD-10-CM | POA: Diagnosis not present

## 2014-02-14 DIAGNOSIS — R480 Dyslexia and alexia: Secondary | ICD-10-CM | POA: Diagnosis present

## 2014-02-14 DIAGNOSIS — Z95810 Presence of automatic (implantable) cardiac defibrillator: Secondary | ICD-10-CM | POA: Diagnosis not present

## 2014-02-14 DIAGNOSIS — I639 Cerebral infarction, unspecified: Secondary | ICD-10-CM

## 2014-02-14 DIAGNOSIS — I129 Hypertensive chronic kidney disease with stage 1 through stage 4 chronic kidney disease, or unspecified chronic kidney disease: Secondary | ICD-10-CM | POA: Diagnosis present

## 2014-02-14 DIAGNOSIS — N189 Chronic kidney disease, unspecified: Secondary | ICD-10-CM

## 2014-02-14 DIAGNOSIS — E876 Hypokalemia: Secondary | ICD-10-CM | POA: Diagnosis present

## 2014-02-14 DIAGNOSIS — Z66 Do not resuscitate: Secondary | ICD-10-CM | POA: Diagnosis not present

## 2014-02-14 DIAGNOSIS — Z888 Allergy status to other drugs, medicaments and biological substances status: Secondary | ICD-10-CM

## 2014-02-14 DIAGNOSIS — I493 Ventricular premature depolarization: Secondary | ICD-10-CM | POA: Diagnosis present

## 2014-02-14 DIAGNOSIS — I5023 Acute on chronic systolic (congestive) heart failure: Principal | ICD-10-CM

## 2014-02-14 DIAGNOSIS — E871 Hypo-osmolality and hyponatremia: Secondary | ICD-10-CM | POA: Diagnosis present

## 2014-02-14 DIAGNOSIS — Z7901 Long term (current) use of anticoagulants: Secondary | ICD-10-CM | POA: Diagnosis not present

## 2014-02-14 DIAGNOSIS — R55 Syncope and collapse: Secondary | ICD-10-CM | POA: Diagnosis present

## 2014-02-14 DIAGNOSIS — G479 Sleep disorder, unspecified: Secondary | ICD-10-CM

## 2014-02-14 DIAGNOSIS — E890 Postprocedural hypothyroidism: Secondary | ICD-10-CM | POA: Diagnosis present

## 2014-02-14 DIAGNOSIS — N184 Chronic kidney disease, stage 4 (severe): Secondary | ICD-10-CM | POA: Diagnosis present

## 2014-02-14 DIAGNOSIS — Z452 Encounter for adjustment and management of vascular access device: Secondary | ICD-10-CM

## 2014-02-14 DIAGNOSIS — I081 Rheumatic disorders of both mitral and tricuspid valves: Secondary | ICD-10-CM | POA: Diagnosis present

## 2014-02-14 DIAGNOSIS — N179 Acute kidney failure, unspecified: Secondary | ICD-10-CM | POA: Insufficient documentation

## 2014-02-14 DIAGNOSIS — T829XXA Unspecified complication of cardiac and vascular prosthetic device, implant and graft, initial encounter: Secondary | ICD-10-CM

## 2014-02-14 DIAGNOSIS — Z6841 Body Mass Index (BMI) 40.0 and over, adult: Secondary | ICD-10-CM

## 2014-02-14 LAB — BASIC METABOLIC PANEL
ANION GAP: 13 (ref 5–15)
BUN: 12 mg/dL (ref 6–23)
CHLORIDE: 106 mmol/L (ref 96–112)
CO2: 21 mmol/L (ref 19–32)
Calcium: 9.1 mg/dL (ref 8.4–10.5)
Creatinine, Ser: 1.82 mg/dL — ABNORMAL HIGH (ref 0.50–1.35)
GFR calc Af Amer: 48 mL/min — ABNORMAL LOW (ref >90)
GFR, EST NON AFRICAN AMERICAN: 41 mL/min — AB (ref >90)
Glucose, Bld: 124 mg/dL — ABNORMAL HIGH (ref 70–99)
Potassium: 3.7 mmol/L (ref 3.5–5.1)
SODIUM: 140 mmol/L (ref 135–145)

## 2014-02-14 LAB — CBC
HCT: 43.7 % (ref 39.0–52.0)
Hemoglobin: 14.2 g/dL (ref 13.0–17.0)
MCH: 30.5 pg (ref 26.0–34.0)
MCHC: 32.5 g/dL (ref 30.0–36.0)
MCV: 93.8 fL (ref 78.0–100.0)
Platelets: 237 10*3/uL (ref 150–400)
RBC: 4.66 MIL/uL (ref 4.22–5.81)
RDW: 14.8 % (ref 11.5–15.5)
WBC: 13 10*3/uL — ABNORMAL HIGH (ref 4.0–10.5)

## 2014-02-14 LAB — BRAIN NATRIURETIC PEPTIDE: B Natriuretic Peptide: 897.2 pg/mL — ABNORMAL HIGH (ref 0.0–100.0)

## 2014-02-14 LAB — I-STAT TROPONIN, ED: Troponin i, poc: 0.04 ng/mL (ref 0.00–0.08)

## 2014-02-14 LAB — PROTIME-INR
INR: 1.2 (ref 0.00–1.49)
PROTHROMBIN TIME: 15.3 s — AB (ref 11.6–15.2)

## 2014-02-14 MED ORDER — FENTANYL CITRATE 0.05 MG/ML IJ SOLN
50.0000 ug | Freq: Once | INTRAMUSCULAR | Status: AC
  Administered 2014-02-14: 50 ug via INTRAVENOUS
  Filled 2014-02-14: qty 2

## 2014-02-14 MED ORDER — LOSARTAN POTASSIUM 50 MG PO TABS
50.0000 mg | ORAL_TABLET | Freq: Every day | ORAL | Status: DC
  Administered 2014-02-15 – 2014-02-17 (×3): 50 mg via ORAL
  Filled 2014-02-14 (×3): qty 1

## 2014-02-14 MED ORDER — WARFARIN SODIUM 7.5 MG PO TABS
15.0000 mg | ORAL_TABLET | ORAL | Status: AC
  Administered 2014-02-14: 15 mg via ORAL
  Filled 2014-02-14: qty 2

## 2014-02-14 MED ORDER — FUROSEMIDE 10 MG/ML IJ SOLN
80.0000 mg | Freq: Two times a day (BID) | INTRAMUSCULAR | Status: DC
  Administered 2014-02-14 – 2014-02-16 (×4): 80 mg via INTRAVENOUS
  Filled 2014-02-14 (×6): qty 8

## 2014-02-14 MED ORDER — CYCLOBENZAPRINE HCL 10 MG PO TABS
10.0000 mg | ORAL_TABLET | Freq: Two times a day (BID) | ORAL | Status: DC
Start: 2014-02-14 — End: 2014-02-22
  Administered 2014-02-14 – 2014-02-22 (×15): 10 mg via ORAL
  Filled 2014-02-14 (×22): qty 1

## 2014-02-14 MED ORDER — HYDROCODONE-ACETAMINOPHEN 5-325 MG PO TABS
1.0000 | ORAL_TABLET | Freq: Three times a day (TID) | ORAL | Status: DC | PRN
  Administered 2014-02-15 – 2014-02-20 (×3): 1 via ORAL
  Filled 2014-02-14 (×3): qty 1

## 2014-02-14 MED ORDER — WARFARIN - PHARMACIST DOSING INPATIENT: Freq: Every day | Status: DC

## 2014-02-14 MED ORDER — SPIRONOLACTONE 25 MG PO TABS
25.0000 mg | ORAL_TABLET | Freq: Every day | ORAL | Status: DC
Start: 2014-02-15 — End: 2014-02-22
  Administered 2014-02-15 – 2014-02-22 (×8): 25 mg via ORAL
  Filled 2014-02-14 (×8): qty 1

## 2014-02-14 MED ORDER — METOLAZONE 2.5 MG PO TABS
2.5000 mg | ORAL_TABLET | ORAL | Status: DC
  Administered 2014-02-15: 2.5 mg via ORAL
  Filled 2014-02-14: qty 1

## 2014-02-14 MED ORDER — SODIUM CHLORIDE 0.9 % IJ SOLN: 3.0000 mL | INTRAMUSCULAR | Status: DC | PRN

## 2014-02-14 MED ORDER — SODIUM CHLORIDE 0.9 % IV SOLN
250.0000 mL | INTRAVENOUS | Status: DC | PRN
  Administered 2014-02-14: 250 mL via INTRAVENOUS

## 2014-02-14 MED ORDER — LEVOTHYROXINE SODIUM 125 MCG PO TABS
250.0000 ug | ORAL_TABLET | Freq: Every day | ORAL | Status: DC
  Administered 2014-02-15 – 2014-02-22 (×8): 250 ug via ORAL
  Filled 2014-02-14 (×10): qty 2

## 2014-02-14 MED ORDER — CARVEDILOL 3.125 MG PO TABS
3.1250 mg | ORAL_TABLET | Freq: Two times a day (BID) | ORAL | Status: DC
  Administered 2014-02-15 – 2014-02-16 (×2): 3.125 mg via ORAL
  Filled 2014-02-14 (×5): qty 1

## 2014-02-14 MED ORDER — POTASSIUM CHLORIDE CRYS ER 20 MEQ PO TBCR
60.0000 meq | EXTENDED_RELEASE_TABLET | Freq: Every day | ORAL | Status: DC
  Administered 2014-02-14 – 2014-02-16 (×3): 60 meq via ORAL
  Filled 2014-02-14 (×6): qty 3

## 2014-02-14 MED ORDER — HEPARIN (PORCINE) IN NACL 100-0.45 UNIT/ML-% IJ SOLN
1350.0000 [IU]/h | INTRAMUSCULAR | Status: DC
  Administered 2014-02-14 – 2014-02-15 (×2): 1350 [IU]/h via INTRAVENOUS
  Filled 2014-02-14 (×2): qty 250

## 2014-02-14 MED ORDER — SODIUM CHLORIDE 0.9 % IJ SOLN
3.0000 mL | Freq: Two times a day (BID) | INTRAMUSCULAR | Status: DC
  Administered 2014-02-14 – 2014-02-18 (×3): 3 mL via INTRAVENOUS
  Administered 2014-02-18: 10 mL via INTRAVENOUS
  Administered 2014-02-19 – 2014-02-21 (×3): 3 mL via INTRAVENOUS

## 2014-02-14 MED ORDER — ACETAMINOPHEN 325 MG PO TABS
650.0000 mg | ORAL_TABLET | ORAL | Status: DC | PRN
  Filled 2014-02-14 (×2): qty 2

## 2014-02-14 MED ORDER — HEPARIN BOLUS VIA INFUSION
4000.0000 [IU] | Freq: Once | INTRAVENOUS | Status: AC
  Administered 2014-02-14: 4000 [IU] via INTRAVENOUS
  Filled 2014-02-14: qty 4000

## 2014-02-14 MED ORDER — ONDANSETRON HCL 4 MG/2ML IJ SOLN: 4.0000 mg | Freq: Four times a day (QID) | INTRAMUSCULAR | Status: DC | PRN

## 2014-02-14 MED ORDER — ALPRAZOLAM 0.25 MG PO TABS
0.2500 mg | ORAL_TABLET | Freq: Every evening | ORAL | Status: DC | PRN
  Administered 2014-02-14 – 2014-02-19 (×3): 0.25 mg via ORAL
  Filled 2014-02-14 (×3): qty 1

## 2014-02-14 MED ORDER — ACETAMINOPHEN 325 MG PO TABS
650.0000 mg | ORAL_TABLET | Freq: Once | ORAL | Status: AC
  Administered 2014-02-14: 650 mg via ORAL
  Filled 2014-02-14: qty 2

## 2014-02-14 NOTE — ED Notes (Signed)
Pt from home with shortness of breath and cough.  Pt with hx of CHF and a fib.  Has a pacemaker. Denies chest pain.

## 2014-02-14 NOTE — H&P (Addendum)
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     LHC in 2008 with normal cors. Myoview 05/2010: Very mild distal anterior and apical ischemia, EF 17% (low risk-medical therapy continued). Last echocardiogram in April 2015 showed an ejection fraction of 10%, mild mitral regurgitation, severe left atrial enlargement, severe right ventricular enlargement with moderately reduced function, severe right atrial enlargement and moderate tricuspid regurgitation. Pulmonary pressure severely elevated. Previously on Amiodarone and had very high TSH. Patient has had previous  TEE guided cardioversion for atrial fibrillation but did not hold sinus rhythm. He ultimately had AV node ablation in December 2014. He has had a previous biventricular ICD.     [name] has had some trouble with medication noncompliance in the past.     Patient has chronic dyspnea on exertion. However over the past 2 weeks he states it has worsened. He also complains of lack of energy. He has orthopnea and complains of increased cough 2 weeks productive of whitish sputum. His cough increases with lying flat. There is no fevers or chills, weight gain or pedal edema. No chest pain. Because of his dyspnea, cough and lack of energy he presented to the emergency room. Patient states he has been compliant with medications and low-sodium diet.          (Not in a hospital admission)           Allergies     Allergen    Reactions     .    Valsartan    Cough              Past Medical History     Diagnosis    Date     .    Non-ischemic cardiomyopathy                 a. echo 11/10: EF 15%;   b. cath 10/08: normal cors;  c.  Myoview 5/12: Very mild distal ant and apical isch, EF 17% (low risk-medical Rx cont'd);  d.  Echo 4/14:  EF 15%;  e. 05/2009 s/p SJM BiV ICD;  f. 07/2012 TEE: EF 20-25%, diff HK mild MR, mod TR.      Marland Kitchen    Atrial fibrillation                 a. on coumadin;  b. 07/2012 s/p TEE/DCCV. c. Recurrence 08/2012 in setting of abnormal thyroid panel.; 12/2012 AVN ablation by Dr [name]     .    Hypertension         .    Obesity         .    Non-compliance         .    Dyslexia                 unable to read.     Marland Kitchen    History of CVA (cerebrovascular accident)    11/2008             a. 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 tx with tPA-> PTA and stenting     .    RBBB (right bundle branch block)         .  Cough syncope                 a. During 08/2012 adm.     .    Systolic CHF, chronic    01/19/1996     .    Hypothyroidism    01/19/1996             s/p RAI therapy     .    Stroke    01/19/2008                  Past Surgical History     Procedure    Laterality    Date     .    Bi-ventricular implantable cardioverter defibrillator  (crt-d)        06/11/09             [employer] [employer] UN[employer] [id number] BIVENTRICULAR AICD SERIAL #[id number]     .    Knee arthroscopy    Left    ~ 1995     .    Cardiac catheterization                     "several time" (09/01/2012)     .    Tee without cardioversion    N/A    06/23/2012            Procedure: TRANSESOPHAGEAL ECHOCARDIOGRAM (TEE);  Surgeon: Vesta Mixer, MD;  Location: [facility] ENDOSCOPY;  Service: Cardiovascular;  Laterality: N/A;  TEE will be done at 0730      .    Cardioversion    N/A    07/27/2012             Procedure: CARDIOVERSION;  Surgeon: Vesta Mixer, MD;  Location: [facility] ENDOSCOPY;  Service: Cardiovascular;  Laterality: N/A;     .    Tee without cardioversion    N/A    08/10/2012              Procedure: TRANSESOPHAGEAL ECHOCARDIOGRAM (TEE);  Surgeon: Wendall Stade, MD;  Location: [facility] ENDOSCOPY;  Service: Cardiovascular;  Laterality: N/A;     .    Cardioversion    N/A    08/10/2012             Procedure: CARDIOVERSION;  Surgeon: Wendall Stade, MD;  Location: [facility] ENDOSCOPY;  Service: Cardiovascular;  Laterality: N/A;     .    Sp pta add intra cran        11/2008             a. 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 tx with tPA-> PTA and stenting(09/01/2012)     .    Ablation        01/09/2013             AVN ablation by Dr [name]     .    Av node ablation    N/A    01/09/2013             Procedure: AV NODE ABLATION;  Surgeon: Marinus Maw, MD;  Location: [facility] CATH LAB;  Service: Cardiovascular;  Laterality: N/A;              History               Social History     .    Marital Status:    Legally Separated             Spouse Name:  N/A             Number of Children:    0     .    Years of Education:    N/A              Occupational History     .    DISABLED                  Social History Main Topics     .    Smoking status:    Never Smoker      .    Smokeless tobacco:    Never Used     .    Alcohol Use:    No     .    Drug Use:    No     .    Sexual Activity:    Not Currently              Other Topics    Concern     .    Not on file             Social History Narrative         Marital status: divorced since 2014; married x 16 years; dating x 4 years since CVA.            Children: none            Lives: Lives alone in house in [place].              Employment:  Disabled from CVA in 2011 (L sided weakness).            Tobacco: none            Alcohol: rarely            Drugs: none            Exercise:  Sporadically.            ADLs: drives; independent with ADLs;  girlfriend does grocery shopping; ambulates with cane.            Seatbelt: 100%                            Family History     Problem    Relation    Age of Onset     .    Heart disease    Father    38             AMI     .    Diabetes    Father         .    Stroke    Father    1             CVA x 2     .    Diabetes    Sister         .    Atrial fibrillation    Sister         .    Hyperlipidemia    Mother         .    Hypertension    Mother         .    Heart disease    Mother    67             CHF          ROS:  Patient complains of headache, cough productive of whitish sputum but no fevers or chills, dysphasia, odynophagia, melena, hematochezia, dysuria, hematuria, rash, seizure activity, pedal edema, claudication. Remaining systems are negative.     Physical Exam:      Blood pressure 109/69, pulse 69, temperature 98.1 F (36.7 C), temperature source Oral, resp. rate 20, SpO2 95 %.     General:  Well developed/obese in NAD  Skin warm/dry  Patient not depressed  No peripheral clubbing  Back-normal  HEENT-normal/normal eyelids  Neck supple/normal carotid upstroke bilaterally; no bruits; JV pulsation at 10 cm; no thyromegaly  chest - CTA/ normal expansion  CV - irregular/normal S1 and S2; no murmurs, rubs or gallops;  PMI nondisplaced  Abdomen -NT/ND, no HSM, no mass, + bowel sounds, no bruit  2+ femoral pulses, no bruits  Ext-no edema, chords, 2+ DP  Neuro-left upper extremity weakness from previous CVA.     ECG ventricular paced rhythm with occasional PVCs and underlying atrial fibrillation.         Lab Results Last 48 Hours  Imaging Results (Last 48 hours)                   Assessment/Plan  1 acute on chronic systolic congestive heart failure-the patient presents with complaints of increased dyspnea on exertion, severe fatigue and cough increased with lying flat. He is not markedly volume overloaded on examination but BNP is elevated.  Symptoms possibly secondary to low output. Will admit and diurese with IV Lasix 80 mg twice a day. Follow renal function. Patient may need inotropes if symptoms do not improve with diuresis.  2 nonischemic cardiomyopathy-will repeat echocardiogram. We'll continue ARB and beta blocker (decrease coreg to 3.125 mg po BID for now in setting of acute on chronic systolic CHF).  3 atrial fibrillation-patient is status post AV node ablation. He has had a previous CVA. INR is subtherapeutic. Will begin IV heparin while he is here. Continue Coumadin.  4 stage 3 chronic renal failure-follow renal function closely with diuresis.  5 previous biventricular ICD     Olga Millers MD  02/14/2014, 5:11 PM   See consult note Olga Millers               Routing History

## 2014-02-14 NOTE — ED Notes (Signed)
Attempted report to 3E. 

## 2014-02-14 NOTE — Progress Notes (Signed)
ANTICOAGULATION CONSULT NOTE - Initial Consult  Pharmacy Consult for Heparin/Coumadin Indication: afib  Allergies  Allergen Reactions  . Valsartan Cough    Patient Measurements:  IBW 70.7 kg Heparin Dosing Weight: 99 kg  Vital Signs: Temp: 98.1 F (36.7 C) (01/28 1523) Temp Source: Oral (01/28 1523) BP: 100/81 mmHg (01/28 1900) Pulse Rate: 72 (01/28 1900)  Labs:  Recent Labs  02/14/14 1524  HGB 14.2  HCT 43.7  PLT 237  LABPROT 15.3*  INR 1.20  CREATININE 1.82*    CrCl cannot be calculated (Unknown ideal weight.).   Medical History: Past Medical History  Diagnosis Date  . Non-ischemic cardiomyopathy     a. echo 11/10: EF 15%;   b. cath 10/08: normal cors;  c. Myoview 5/12: Very mild distal ant and apical isch, EF 17% (low risk-medical Rx cont'd);  d.  Echo 4/14:  EF 15%;  e. 05/2009 s/p SJM BiV ICD;  f. 07/2012 TEE: EF 20-25%, diff HK mild MR, mod TR.   Marland Kitchen Atrial fibrillation     a. on coumadin;  b. 07/2012 s/p TEE/DCCV. c. Recurrence 08/2012 in setting of abnormal thyroid panel.; 12/2012 AVN ablation by Dr [name]  . Hypertension   . Obesity   . Non-compliance   . Dyslexia     unable to read.  Marland Kitchen History of CVA (cerebrovascular accident) 11/2008    a. 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 RBBB (right bundle branch block)   . Cough syncope     a. During 08/2012 adm.  . Systolic CHF, chronic 01/19/1996  . Hypothyroidism 01/19/1996    s/p RAI therapy  . Stroke 01/19/2008  . Renal insufficiency     Medications:  Prescriptions prior to admission  Medication Sig Dispense Refill Last Dose  . acyclovir (ZOVIRAX) 800 MG tablet Take 400 mg by mouth 2 (two) times daily.    Taking  . ALPRAZolam (XANAX) 0.25 MG tablet Take 1 tablet (0.25 mg total) by mouth at bedtime as needed for anxiety. 30 tablet 2 Taking  . carvedilol (COREG) 6.25 MG tablet Take 3 tablets (18.75 mg total) by mouth 2 (two) times daily with a meal. 180 tablet 3 Taking  . cyclobenzaprine  (FLEXERIL) 10 MG tablet Take 1 tablet (10 mg total) by mouth 2 (two) times daily. 20 tablet 0 Taking  . HYDROcodone-acetaminophen (NORCO/VICODIN) 5-325 MG per tablet TAKE 1 TABLET BY MOUTH EVERY 8 HOURS AS NEEDED (Patient taking differently: TAKE 1 TABLET BY MOUTH EVERY 8 HOURS AS NEEDED FOR PAIN) 15 tablet 0 Taking  . levothyroxine (SYNTHROID, LEVOTHROID) 125 MCG tablet Take 250 mcg by mouth daily before breakfast.   Taking  . losartan (COZAAR) 50 MG tablet TAKE 1 TABLET BY MOUTH ONCE DAILY 30 tablet 6 Taking  . metolazone (ZAROXOLYN) 2.5 MG tablet Take 2.5 mg on Monday, Wednesday and Friday. Take extra tablet as needed for weight gain (call clinic first). 15 tablet 3   . potassium chloride SA (KLOR-CON M20) 20 MEQ tablet Take 60 mEq by mouth at bedtime.   Taking  . spironolactone (ALDACTONE) 25 MG tablet Take 1 tablet (25 mg total) by mouth daily. 90 tablet 3 Taking  . torsemide (DEMADEX) 20 MG tablet Take 3 tablets (60 mg total) by mouth daily. 90 tablet 3 Taking  . warfarin (COUMADIN) 7.5 MG tablet Take 1 to 1.5 tablets by mouth daily as directed by coumadin clinic 40 tablet 3 Taking    Assessment: SOB, cough, weakness x 2  wks 54 y/o morbidly obese male with afib, severe CM/CHF, PPM, presents with SOB, cough, and weakness. Patient is on chronic Coumadin for afib/CVA with admit INR 1.2. H/o noncompliance noted.  Anticoagulation: Afib with h/o CVA. Admit INR 1.2 on 75.mg daily except 11.25mg  (1.5 tabs) on Mon/Thurs/Sat. Hgb WNL Infectious Disease: Afebrile. WBC 13. Cardiovascular: HF (EF 10%), BP 100/81 proBNP 897, on Coreg, IV Lasix, losartan, metolazone, K+, spironolactone. Endocrinology: Synthroid Gastrointestinal / Nutrition: Neurology Nephrology: CKD, Scr 1.82. Pulmonary Hematology / Oncology PTA Medication Issues Best Practices  Goal of Therapy:  INR 2-3  Heparin level 0.3-0.7 Monitor platelets by anticoagulation protocol: Yes   Plan:  Heparin 4000 unit IV bolus Heparin  infusion 1350 units/hr Coumadin 15mg  po x 1 tonight. Heparin level, CBC, INR daily.   Jamesen Stahnke S. Merilynn Finland, PharmD, BCPS Clinical Staff Pharmacist Pager [phone]   Misty Stanley Stillinger 02/14/2014,8:15 PM

## 2014-02-14 NOTE — Consult Note (Signed)
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  LHC in 2008 with normal cors. Myoview 05/2010: Very mild distal anterior and apical ischemia, EF 17% (low risk-medical therapy continued). Last echocardiogram in April 2015 showed an ejection fraction of 10%, mild mitral regurgitation, severe left atrial enlargement, severe right ventricular enlargement with moderately reduced function, severe right atrial enlargement and moderate tricuspid regurgitation. Pulmonary pressure severely elevated. Previously on Amiodarone and had very high TSH. Patient has had previous  TEE guided cardioversion for atrial fibrillation but did not hold sinus rhythm. He ultimately had AV node ablation in December 2014. He has had a previous biventricular ICD.  Mr Dian Situ has had some trouble with medication noncompliance in the past.  Patient has chronic dyspnea on exertion. However over the past 2 weeks he states it has worsened. He also complains of lack of energy. He has orthopnea and complains of increased cough 2 weeks productive of whitish sputum. His cough increases with lying flat. There is no fevers or chills, weight gain or pedal edema. No chest pain. Because of his dyspnea, cough and lack of energy he presented to the emergency room. Patient states he has been compliant with medications and low-sodium diet.    (Not in a hospital admission)  Allergies  Allergen Reactions  . Valsartan Cough    Past Medical History  Diagnosis Date  . Non-ischemic cardiomyopathy     a. echo 11/10: EF 15%;   b. cath 10/08: normal cors;  c. Myoview 5/12: Very mild distal ant and apical isch, EF 17% (low risk-medical Rx cont'd);  d.  Echo 4/14:  EF 15%;  e. 05/2009 s/p SJM BiV ICD;  f. 07/2012 TEE: EF 20-25%, diff HK mild MR, mod TR.   Marland Kitchen  Atrial fibrillation     a. on coumadin;  b. 07/2012 s/p TEE/DCCV. c. Recurrence 08/2012 in setting of abnormal thyroid panel.; 12/2012 AVN ablation by Dr Lovena Le  . Hypertension   . Obesity   . Non-compliance   . Dyslexia     unable to read.  Marland Kitchen History of CVA (cerebrovascular accident) 11/2008    a. 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 RBBB (right bundle branch block)   . Cough syncope     a. During 08/2012 adm.  . Systolic CHF, chronic 0/08/6576  . Hypothyroidism 01/19/1996    s/p RAI therapy  . Stroke 01/19/2008    Past Surgical History  Procedure Laterality Date  . Bi-ventricular implantable cardioverter defibrillator  (crt-d)  06/11/09    [place] UNIFY [id number] BIVENTRICULAR AICD SERIAL #[id number]  . Knee arthroscopy Left ~ 1995  . Cardiac catheterization      "several time" (09/01/2012)  . Tee without cardioversion N/A 06/23/2012    Procedure: TRANSESOPHAGEAL ECHOCARDIOGRAM (TEE);  Surgeon: Thayer Headings, MD;  Location: [facility] ENDOSCOPY;  Service: Cardiovascular;  Laterality: N/A;  TEE will be done at 0730   . Cardioversion N/A 07/27/2012    Procedure: CARDIOVERSION;  Surgeon: Thayer Headings, MD;  Location: [facility] ENDOSCOPY;  Service: Cardiovascular;  Laterality: N/A;  . Tee without cardioversion N/A 08/10/2012    Procedure: TRANSESOPHAGEAL ECHOCARDIOGRAM (TEE);  Surgeon: Josue Hector, MD;  Location: [place];  Service: Cardiovascular;  Laterality: N/A;  . Cardioversion N/A 08/10/2012    Procedure:  CARDIOVERSION;  Surgeon: Josue Hector, MD;  Location: [place];  Service: Cardiovascular;  Laterality: N/A;  . Sp pta add intra cran  11/2008    a. 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 Ablation  01/09/2013    AVN ablation by Dr Lovena Le  . Av node ablation N/A 01/09/2013    Procedure: AV NODE ABLATION;  Surgeon: Evans Lance, MD;  Location: [facility] CATH LAB;  Service: Cardiovascular;  Laterality: N/A;    History   Social History  .  Marital Status: Legally Separated    Spouse Name: N/A    Number of Children: 0  . Years of Education: N/A   Occupational History  . DISABLED    Social History Main Topics  . Smoking status: Never Smoker   . Smokeless tobacco: Never Used  . Alcohol Use: No  . Drug Use: No  . Sexual Activity: Not Currently   Other Topics Concern  . Not on file   Social History Narrative   Marital status: divorced since 2014; married x 16 years; dating x 4 years since CVA.      Children: none      Lives: Lives alone in house in [place].        Employment:  Disabled from [employer] in 2011 (L sided weakness).      Tobacco: none      Alcohol: rarely      Drugs: none      Exercise:  Sporadically.      ADLs: drives; independent with ADLs; girlfriend does grocery shopping; ambulates with cane.      Seatbelt: 100%        Family History  Problem Relation Age of Onset  . Heart disease Father 87    AMI  . Diabetes Father   . Stroke Father 36    CVA x 2  . Diabetes Sister   . Atrial fibrillation Sister   . Hyperlipidemia Mother   . Hypertension Mother   . Heart disease Mother 57    CHF    ROS:  Patient complains of headache, cough productive of whitish sputum but no fevers or chills, dysphasia, odynophagia, melena, hematochezia, dysuria, hematuria, rash, seizure activity, pedal edema, claudication. Remaining systems are negative.  Physical Exam:   Blood pressure 109/69, pulse 69, temperature 98.1 F (36.7 C), temperature source Oral, resp. rate 20, SpO2 95 %.  General:  Well developed/obese in NAD Skin warm/dry Patient not depressed No peripheral clubbing Back-normal HEENT-normal/normal eyelids Neck supple/normal carotid upstroke bilaterally; no bruits; JV pulsation at 10 cm; no thyromegaly chest - CTA/ normal expansion CV - irregular/normal S1 and S2; no murmurs, rubs or gallops;  PMI nondisplaced Abdomen -NT/ND, no HSM, no mass, + bowel sounds, no bruit 2+ femoral pulses, no  bruits Ext-no edema, chords, 2+ DP Neuro-left upper extremity weakness from previous CVA.  ECG ventricular paced rhythm with occasional PVCs and underlying atrial fibrillation.  Results for orders placed or performed during the hospital encounter of 02/14/14 (from the past 48 hour(s))  CBC     Status: Abnormal   Collection Time: 02/14/14  3:24 PM  Result Value Ref Range   WBC 13.0 (H) 4.0 - 10.5 K/uL   RBC 4.66 4.22 - 5.81 MIL/uL   Hemoglobin 14.2 13.0 - 17.0 g/dL   HCT 43.7 39.0 - 52.0 %   MCV 93.8 78.0 - 100.0 fL   MCH 30.5 26.0 - 34.0 pg   MCHC 32.5 30.0 - 36.0  g/dL   RDW 14.8 11.5 - 15.5 %   Platelets 237 150 - 400 K/uL  Basic metabolic panel     Status: Abnormal   Collection Time: 02/14/14  3:24 PM  Result Value Ref Range   Sodium 140 135 - 145 mmol/L   Potassium 3.7 3.5 - 5.1 mmol/L   Chloride 106 96 - 112 mmol/L   CO2 21 19 - 32 mmol/L   Glucose, Bld 124 (H) 70 - 99 mg/dL   BUN 12 6 - 23 mg/dL   Creatinine, Ser 1.82 (H) 0.50 - 1.35 mg/dL   Calcium 9.1 8.4 - 10.5 mg/dL   GFR calc non Af Amer 41 (L) >90 mL/min   GFR calc Af Amer 48 (L) >90 mL/min    Comment: (NOTE) The eGFR has been calculated using the CKD EPI equation. This calculation has not been validated in all clinical situations. eGFR's persistently <90 mL/min signify possible Chronic Kidney Disease.    Anion gap 13 5 - 15  BNP (order ONLY if patient complains of dyspnea/SOB AND you have documented it for THIS visit)     Status: Abnormal   Collection Time: 02/14/14  3:24 PM  Result Value Ref Range   B Natriuretic Peptide 897.2 (H) 0.0 - 100.0 pg/mL  Protime-INR (if pt is taking Coumadin)     Status: Abnormal   Collection Time: 02/14/14  3:24 PM  Result Value Ref Range   Prothrombin Time 15.3 (H) 11.6 - 15.2 seconds   INR 1.20 0.00 - 1.49  I-stat troponin, ED (not at [facility])     Status: None   Collection Time: 02/14/14  3:38 PM  Result Value Ref Range   Troponin i, poc 0.04 0.00 - 0.08 ng/mL   Comment 3             Comment: Due to the release kinetics of cTnI, a negative result within the first hours of the onset of symptoms does not rule out myocardial infarction with certainty. If myocardial infarction is still suspected, repeat the test at appropriate intervals.     Dg Chest 2 View  02/14/2014   CLINICAL DATA:  Cough and congestion for 2 weeks  EXAM: CHEST  2 VIEW  COMPARISON:  February 26, 2013.  FINDINGS: There is no edema or consolidation. There is moderate generalized cardiac enlargement with pacemaker leads attached to the right atrium and right ventricle. Pulmonary vascularity is within normal limits. No adenopathy. There is slight anterior wedging of 2 lower thoracic vertebral bodies.  IMPRESSION: No edema or consolidation.  Stable cardiac enlargement.   Electronically Signed   By: Lowella Grip M.D.   On: 02/14/2014 16:46    Assessment/Plan 1 acute on chronic systolic congestive heart failure-the patient presents with complaints of increased dyspnea on exertion, severe fatigue and cough increased with lying flat. He is not markedly volume overloaded on examination but BNP is elevated.  Symptoms possibly secondary to low output. Will admit and diurese with IV Lasix 80 mg twice a day. Follow renal function. Patient may need inotropes if symptoms do not improve with diuresis. 2 nonischemic cardiomyopathy-will repeat echocardiogram. We'll continue ARB and beta blocker (decrease coreg to 3.125 mg po BID for now in setting of acute on chronic systolic CHF). 3 atrial fibrillation-patient is status post AV node ablation. He has had a previous CVA. INR is subtherapeutic. Will begin IV heparin while he is here. Continue Coumadin. 4 stage 3 chronic renal failure-follow renal function closely with diuresis. 5 previous  biventricular ICD  Kirk Ruths MD 02/14/2014, 5:11 PM

## 2014-02-14 NOTE — ED Provider Notes (Signed)
CSN: [id number]     Arrival date & time 02/14/14  1518 History   First MD Initiated Contact with Patient 02/14/14 1532     Chief Complaint  Patient presents with  . Shortness of Breath     (Consider location/radiation/quality/duration/timing/severity/associated sxs/prior Treatment) HPI  54 year old male presents with cough and weakness for the past 2 weeks. He is a prior history of A. Fib, severe cardiomyopathy and CHF, and has a pacemaker. He was seen in his heart failure clinic couple weeks ago and told he was doing well. Since then he has felt worse. Has had a nonproductive cough without fever. Has had increased shortness of breath area no chest pain or chest pressure. Denies any leg swelling and states he's actually been losing weight and set of gaining weight. He feels like when he takes his fluid pills he is not paying as much is normal. Anytime he gets up to walk he is very weak and short of breath, much more than normal.  Past Medical History  Diagnosis Date  . Non-ischemic cardiomyopathy     a. echo 11/10: EF 15%;   b. cath 10/08: normal cors;  c. Myoview 5/12: Very mild distal ant and apical isch, EF 17% (low risk-medical Rx cont'd);  d.  Echo 4/14:  EF 15%;  e. 05/2009 s/p SJM BiV ICD;  f. 07/2012 TEE: EF 20-25%, diff HK mild MR, mod TR.   Marland Kitchen Atrial fibrillation     a. on coumadin;  b. 07/2012 s/p TEE/DCCV. c. Recurrence 08/2012 in setting of abnormal thyroid panel.; 12/2012 AVN ablation by Dr [name]  . Hypertension   . Obesity   . Non-compliance   . Dyslexia     unable to read.  Marland Kitchen History of CVA (cerebrovascular accident) 11/2008    a. 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 RBBB (right bundle branch block)   . Cough syncope     a. During 08/2012 adm.  . Systolic CHF, chronic 01/19/1996  . Hypothyroidism 01/19/1996    s/p RAI therapy  . Stroke 01/19/2008   Past Surgical History  Procedure Laterality Date  . Bi-ventricular implantable cardioverter defibrillator   (crt-d)  06/11/09    [facility] [id number] BIVENTRICULAR AICD SERIAL #[id number]  . Knee arthroscopy Left ~ 1995  . Cardiac catheterization      "several time" (09/01/2012)  . Tee without cardioversion N/A 06/23/2012    Procedure: TRANSESOPHAGEAL ECHOCARDIOGRAM (TEE);  Surgeon: Vesta Mixer, MD;  Location: [facility] ENDOSCOPY;  Service: Cardiovascular;  Laterality: N/A;  TEE will be done at 0730   . Cardioversion N/A 07/27/2012    Procedure: CARDIOVERSION;  Surgeon: Vesta Mixer, MD;  Location: [facility] ENDOSCOPY;  Service: Cardiovascular;  Laterality: N/A;  . Tee without cardioversion N/A 08/10/2012    Procedure: TRANSESOPHAGEAL ECHOCARDIOGRAM (TEE);  Surgeon: Wendall Stade, MD;  Location: [facility] ENDOSCOPY;  Service: Cardiovascular;  Laterality: N/A;  . Cardioversion N/A 08/10/2012    Procedure: CARDIOVERSION;  Surgeon: Wendall Stade, MD;  Location: [facility] ENDOSCOPY;  Service: Cardiovascular;  Laterality: N/A;  . Sp pta add intra cran  11/2008    a. 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 Ablation  01/09/2013    AVN ablation by Dr [name]  . Av node ablation N/A 01/09/2013    Procedure: AV NODE ABLATION;  Surgeon: Marinus Maw, MD;  Location: [facility] CATH LAB;  Service: Cardiovascular;  Laterality: N/A;   Family History  Problem Relation Age of Onset  . Heart disease Father 17    AMI  . Diabetes Father   . Stroke Father 83    CVA x 2  . Diabetes Sister   . Atrial fibrillation Sister   . Hyperlipidemia Mother   . Hypertension Mother   . Heart disease Mother 39    CHF   History  Substance Use Topics  . Smoking status: Never Smoker   . Smokeless tobacco: Never Used  . Alcohol Use: No    Review of Systems  Constitutional: Negative for fever.  HENT: Positive for sore throat.   Respiratory: Positive for cough and shortness of breath.   Cardiovascular: Negative for chest pain and leg swelling.  Gastrointestinal: Negative for vomiting and abdominal pain.   Genitourinary: Positive for decreased urine volume.  Neurological: Positive for weakness and headaches.  All other systems reviewed and are negative.     Allergies  Valsartan  Home Medications   Prior to Admission medications   Medication Sig Start Date End Date Taking? Authorizing Provider  acyclovir (ZOVIRAX) 800 MG tablet Take 400 mg by mouth 2 (two) times daily.     Historical Provider, MD  ALPRAZolam Prudy Feeler) 0.25 MG tablet Take 1 tablet (0.25 mg total) by mouth at bedtime as needed for anxiety. 04/16/13   Ethelda Chick, MD  carvedilol (COREG) 6.25 MG tablet Take 3 tablets (18.75 mg total) by mouth 2 (two) times daily with a meal. 12/26/13   Laurey Morale, MD  cyclobenzaprine (FLEXERIL) 10 MG tablet Take 1 tablet (10 mg total) by mouth 2 (two) times daily. 07/26/13   Gwenlyn Found Copland, MD  HYDROcodone-acetaminophen (NORCO/VICODIN) 5-325 MG per tablet TAKE 1 TABLET BY MOUTH EVERY 8 HOURS AS NEEDED Patient taking differently: TAKE 1 TABLET BY MOUTH EVERY 8 HOURS AS NEEDED FOR PAIN 07/30/13   Gwenlyn Found Copland, MD  levothyroxine (SYNTHROID, LEVOTHROID) 125 MCG tablet Take 250 mcg by mouth daily before breakfast.    Historical Provider, MD  losartan (COZAAR) 50 MG tablet TAKE 1 TABLET BY MOUTH ONCE DAILY 12/25/13   Lyn Records III, MD  metolazone (ZAROXOLYN) 2.5 MG tablet Take 2.5 mg on Monday, Wednesday and Friday. Take extra tablet as needed for weight gain (call clinic first). 02/04/14   Bevelyn Buckles Bensimhon, MD  potassium chloride SA (KLOR-CON M20) 20 MEQ tablet Take 60 mEq by mouth at bedtime.    Historical Provider, MD  spironolactone (ALDACTONE) 25 MG tablet Take 1 tablet (25 mg total) by mouth daily. 08/29/13   Aundria Rud, NP  torsemide (DEMADEX) 20 MG tablet Take 3 tablets (60 mg total) by mouth daily. 03/14/13   Aundria Rud, NP  warfarin (COUMADIN) 7.5 MG tablet Take 1 to 1.5 tablets by mouth daily as directed by coumadin clinic 09/10/13   [name], MD   BP 125/80 mmHg   Pulse 96  Temp(Src) 98.1 F (36.7 C) (Oral)  Resp 22  SpO2 99% Physical Exam  Constitutional: He is oriented to person, place, and time. He appears well-developed and well-nourished. No distress.  HENT:  Head: Normocephalic and atraumatic.  Right Ear: External ear normal.  Left Ear: External ear normal.  Nose: Nose normal.  Eyes: Right eye exhibits no discharge. Left eye exhibits no discharge.  Neck: Neck supple.  Cardiovascular: Normal rate, regular rhythm, normal heart sounds and intact distal pulses.   Pulmonary/Chest: Effort normal. He has rales (bibasilar, mild).  Abdominal: Soft. There is no tenderness.  Musculoskeletal:  Normal range of motion. He exhibits no edema or tenderness.  Neurological: He is alert and oriented to person, place, and time.  Skin: Skin is warm and dry. He is not diaphoretic.  Nursing note and vitals reviewed.   ED Course  Procedures (including critical care time) Labs Review Labs Reviewed  CBC - Abnormal; Notable for the following:    WBC 13.0 (*)    All other components within normal limits  BASIC METABOLIC PANEL - Abnormal; Notable for the following:    Glucose, Bld 124 (*)    Creatinine, Ser 1.82 (*)    GFR calc non Af Amer 41 (*)    GFR calc Af Amer 48 (*)    All other components within normal limits  BRAIN NATRIURETIC PEPTIDE - Abnormal; Notable for the following:    B Natriuretic Peptide 897.2 (*)    All other components within normal limits  PROTIME-INR - Abnormal; Notable for the following:    Prothrombin Time 15.3 (*)    All other components within normal limits  BASIC METABOLIC PANEL  BRAIN NATRIURETIC PEPTIDE  HEPARIN LEVEL (UNFRACTIONATED)  CBC  PROTIME-INR  I-STAT TROPOININ, ED    Imaging Review Dg Chest 2 View  02/14/2014   CLINICAL DATA:  Cough and congestion for 2 weeks  EXAM: CHEST  2 VIEW  COMPARISON:  February 26, 2013.  FINDINGS: There is no edema or consolidation. There is moderate generalized cardiac enlargement  with pacemaker leads attached to the right atrium and right ventricle. Pulmonary vascularity is within normal limits. No adenopathy. There is slight anterior wedging of 2 lower thoracic vertebral bodies.  IMPRESSION: No edema or consolidation.  Stable cardiac enlargement.   Electronically Signed   By: Bretta Bang M.D.   On: 02/14/2014 16:46     Date: 02/14/2014  Rate: 72  Rhythm: paced rhythm  QRS Axis: left  Intervals: normal  ST/T Wave abnormalities: nonspecific ST/T changes  Conduction Disutrbances:left bundle branch block  Narrative Interpretation: Pacemaker, unchanged  Old EKG Reviewed: unchanged    MDM   Final diagnoses:  Acute on chronic systolic CHF (congestive heart failure)    Patient with acute on chronic CHF. Mild increase in creatinine as well as elevated BNP. Patient is not distress but likely needs medication adjustment and IV Lasix at this time. Cardiology has seen patient and will admit. No anginal symptoms.    Audree Camel, MD 02/14/14 [phone]

## 2014-02-14 NOTE — ED Notes (Signed)
Pt stated he had no pain when asked initially and after coughing pt reports he has a h/a 6/10.

## 2014-02-15 ENCOUNTER — Inpatient Hospital Stay (HOSPITAL_COMMUNITY): Payer: Medicare Other

## 2014-02-15 ENCOUNTER — Encounter (HOSPITAL_COMMUNITY): Payer: Self-pay | Admitting: Student

## 2014-02-15 ENCOUNTER — Encounter (HOSPITAL_COMMUNITY)
Admission: EM | Disposition: A | Discharge status: Home Health | Payer: Self-pay | Source: Home / Self Care | Attending: Cardiology

## 2014-02-15 DIAGNOSIS — I482 Chronic atrial fibrillation: Secondary | ICD-10-CM

## 2014-02-15 DIAGNOSIS — I509 Heart failure, unspecified: Secondary | ICD-10-CM

## 2014-02-15 DIAGNOSIS — N183 Chronic kidney disease, stage 3 (moderate): Secondary | ICD-10-CM

## 2014-02-15 DIAGNOSIS — R55 Syncope and collapse: Secondary | ICD-10-CM

## 2014-02-15 DIAGNOSIS — R404 Transient alteration of awareness: Secondary | ICD-10-CM

## 2014-02-15 HISTORY — PX: RIGHT HEART CATHETERIZATION: SHX5447

## 2014-02-15 LAB — BASIC METABOLIC PANEL WITH GFR
Anion gap: 13 (ref 5–15)
BUN: 16 mg/dL (ref 6–23)
CO2: 22 mmol/L (ref 19–32)
Calcium: 9 mg/dL (ref 8.4–10.5)
Chloride: 102 mmol/L (ref 96–112)
Creatinine, Ser: 1.89 mg/dL — ABNORMAL HIGH (ref 0.50–1.35)
GFR calc Af Amer: 46 mL/min — ABNORMAL LOW (ref >90)
GFR calc non Af Amer: 40 mL/min — ABNORMAL LOW (ref >90)
Glucose, Bld: 101 mg/dL — ABNORMAL HIGH (ref 70–99)
Potassium: 3.4 mmol/L — ABNORMAL LOW (ref 3.5–5.1)
Sodium: 137 mmol/L (ref 135–145)

## 2014-02-15 LAB — POCT I-STAT 3, VENOUS BLOOD GAS (G3P V)
ACID-BASE EXCESS: 2 mmol/L (ref 0.0–2.0)
BICARBONATE: 26.8 meq/L — AB (ref 20.0–24.0)
Bicarbonate: 25.5 mEq/L — ABNORMAL HIGH (ref 20.0–24.0)
O2 SAT: 41 %
O2 Saturation: 46 %
PH VEN: 7.39 — AB (ref 7.250–7.300)
TCO2: 27 mmol/L (ref 0–100)
TCO2: 28 mmol/L (ref 0–100)
pCO2, Ven: 42 mmHg — ABNORMAL LOW (ref 45.0–50.0)
pCO2, Ven: 43.7 mmHg — ABNORMAL LOW (ref 45.0–50.0)
pH, Ven: 7.397 — ABNORMAL HIGH (ref 7.250–7.300)
pO2, Ven: 26 mmHg — CL (ref 30.0–45.0)
pO2, Ven: 23 mmHg — CL (ref 30.0–45.0)

## 2014-02-15 LAB — CBC
HEMATOCRIT: 43.5 % (ref 39.0–52.0)
Hemoglobin: 14.2 g/dL (ref 13.0–17.0)
MCH: 30.6 pg (ref 26.0–34.0)
MCHC: 32.6 g/dL (ref 30.0–36.0)
MCV: 93.8 fL (ref 78.0–100.0)
Platelets: 225 10*3/uL (ref 150–400)
RBC: 4.64 MIL/uL (ref 4.22–5.81)
RDW: 15 % (ref 11.5–15.5)
WBC: 10 10*3/uL (ref 4.0–10.5)

## 2014-02-15 LAB — PROTIME-INR
INR: 1.31 (ref 0.00–1.49)
PROTHROMBIN TIME: 16.4 s — AB (ref 11.6–15.2)

## 2014-02-15 LAB — MRSA PCR SCREENING: MRSA by PCR: NEGATIVE

## 2014-02-15 LAB — HEPARIN LEVEL (UNFRACTIONATED)
Heparin Unfractionated: 0.39 IU/mL (ref 0.30–0.70)
Heparin Unfractionated: 0.48 IU/mL (ref 0.30–0.70)

## 2014-02-15 LAB — BRAIN NATRIURETIC PEPTIDE: B Natriuretic Peptide: 616.4 pg/mL — ABNORMAL HIGH (ref 0.0–100.0)

## 2014-02-15 SURGERY — RIGHT HEART CATH
Anesthesia: LOCAL

## 2014-02-15 MED ORDER — HEPARIN (PORCINE) IN NACL 100-0.45 UNIT/ML-% IJ SOLN
1500.0000 [IU]/h | INTRAMUSCULAR | Status: DC
  Administered 2014-02-15: 1350 [IU]/h via INTRAVENOUS
  Administered 2014-02-16: 1700 [IU]/h via INTRAVENOUS
  Administered 2014-02-17 – 2014-02-18 (×3): 1600 [IU]/h via INTRAVENOUS
  Filled 2014-02-15 (×12): qty 250

## 2014-02-15 MED ORDER — SODIUM CHLORIDE 0.9 % IJ SOLN: 3.0000 mL | Freq: Two times a day (BID) | INTRAMUSCULAR | Status: DC

## 2014-02-15 MED ORDER — SODIUM CHLORIDE 0.9 % IJ SOLN: 3.0000 mL | INTRAMUSCULAR | Status: DC | PRN

## 2014-02-15 MED ORDER — MIDAZOLAM HCL 2 MG/2ML IJ SOLN
INTRAMUSCULAR | Status: AC
  Filled 2014-02-15: qty 2

## 2014-02-15 MED ORDER — LIDOCAINE HCL (PF) 1 % IJ SOLN
INTRAMUSCULAR | Status: AC
  Filled 2014-02-15: qty 30

## 2014-02-15 MED ORDER — ASPIRIN 81 MG PO CHEW
81.0000 mg | CHEWABLE_TABLET | ORAL | Status: AC
  Administered 2014-02-15: 81 mg via ORAL
  Filled 2014-02-15: qty 1

## 2014-02-15 MED ORDER — MILRINONE IN DEXTROSE 20 MG/100ML IV SOLN
0.3750 ug/kg/min | INTRAVENOUS | Status: DC
  Administered 2014-02-15 – 2014-02-17 (×4): 0.25 ug/kg/min via INTRAVENOUS
  Administered 2014-02-17 – 2014-02-21 (×11): 0.375 ug/kg/min via INTRAVENOUS
  Filled 2014-02-15 (×22): qty 100

## 2014-02-15 MED ORDER — HEPARIN (PORCINE) IN NACL 2-0.9 UNIT/ML-% IJ SOLN
INTRAMUSCULAR | Status: AC
  Filled 2014-02-15: qty 500

## 2014-02-15 MED ORDER — SODIUM CHLORIDE 0.9 % IV SOLN: 250.0000 mL | INTRAVENOUS | Status: DC | PRN

## 2014-02-15 MED ORDER — FENTANYL CITRATE 0.05 MG/ML IJ SOLN
INTRAMUSCULAR | Status: AC
  Filled 2014-02-15: qty 2

## 2014-02-15 MED ORDER — DEXTROMETHORPHAN POLISTIREX 30 MG/5ML PO LQCR
30.0000 mg | Freq: Two times a day (BID) | ORAL | Status: DC | PRN
  Administered 2014-02-15 – 2014-02-21 (×11): 30 mg via ORAL
  Filled 2014-02-15 (×23): qty 5

## 2014-02-15 MED ORDER — MILRINONE IN DEXTROSE 20 MG/100ML IV SOLN
0.1250 ug/kg/min | INTRAVENOUS | Status: DC
  Filled 2014-02-15: qty 100

## 2014-02-15 MED ORDER — WARFARIN SODIUM 2.5 MG PO TABS
12.5000 mg | ORAL_TABLET | Freq: Once | ORAL | Status: AC
  Filled 2014-02-15 (×2): qty 1

## 2014-02-15 MED ORDER — PERFLUTREN LIPID MICROSPHERE
1.0000 mL | INTRAVENOUS | Status: AC | PRN
  Administered 2014-02-15: 2 mL via INTRAVENOUS
  Filled 2014-02-15: qty 10

## 2014-02-15 MED ORDER — SODIUM CHLORIDE 0.9 % IJ SOLN
3.0000 mL | Freq: Two times a day (BID) | INTRAMUSCULAR | Status: DC
Start: 2014-02-16 — End: 2014-02-22
  Administered 2014-02-16: 3 mL via INTRAVENOUS
  Administered 2014-02-18: 10 mL via INTRAVENOUS
  Administered 2014-02-18 – 2014-02-21 (×4): 3 mL via INTRAVENOUS

## 2014-02-15 MED ORDER — ACETAMINOPHEN 325 MG PO TABS
650.0000 mg | ORAL_TABLET | ORAL | Status: DC | PRN
  Administered 2014-02-16 – 2014-02-19 (×4): 650 mg via ORAL
  Filled 2014-02-15 (×2): qty 2

## 2014-02-15 MED ORDER — ONDANSETRON HCL 4 MG/2ML IJ SOLN: 4.0000 mg | Freq: Four times a day (QID) | INTRAMUSCULAR | Status: DC | PRN

## 2014-02-15 NOTE — Progress Notes (Signed)
eLink Physician-Brief Progress Note Patient Name: Raymond [name] DOB: Mar 27, 1960 MRN: [id number]   Date of Service  02/15/2014  HPI/Events of Note  54 yo man, hx cardiomyopathy, s/p R heart cath. To ICU stable with exception of hypertension on milrinone. ? Goals for his MAP, UOP, PAP's. Will investigate this.   eICU Interventions       Intervention Category Evaluation Type: New Patient Evaluation  Jadelin Eng S. 02/15/2014, 6:46 PM

## 2014-02-15 NOTE — Progress Notes (Addendum)
ANTICOAGULATION CONSULT NOTE - Follow Up Consult  Pharmacy Consult for Heparin/Coumadin Indication: atrial fibrillation  Allergies  Allergen Reactions  . Valsartan Cough    Patient Measurements: Weight: 284 lb 13.4 oz (129.2 kg) Heparin Dosing Weight: ~104 kg  Vital Signs: Temp: 97.3 F (36.3 C) (01/29 0552) Temp Source: Oral (01/29 0552) BP: 117/76 mmHg (01/29 1257) Pulse Rate: 70 (01/29 1257)  Labs:  Recent Labs  02/14/14 1524 02/15/14 0500 02/15/14 1055  HGB 14.2 14.2  --   HCT 43.7 43.5  --   PLT 237 225  --   LABPROT 15.3* 16.4*  --   INR 1.20 1.31  --   HEPARINUNFRC  --  0.39 0.48  CREATININE 1.82* 1.89*  --     Estimated Creatinine Clearance: 61.5 mL/min (by C-G formula based on Cr of 1.89).   Medications:  Infusions:  . heparin 1,350 Units/hr (02/14/14 2116)    Assessment: 54 yo male admitted with HF.  On chronic Coumadin for afib, INR subtherapeutic on admission, question compliance.  Pharmacy asked to begin bridging with IV heparin.  Heparin level has been therapeutic x 2 on heparin at 1350 units/hr.  CBC stable.  No bleeding or complications noted.  INR 1.31.  Coumadin dose PTA 7.5 mg daily with 11.25 on Mon, Thurs, Sat.    Goal of Therapy:  Heparin level 0.3-0.7 units/ml  INR 2-3 Monitor platelets by anticoagulation protocol: Yes   Plan:  1. Continue IV heparin at current rate. 2. Continue daily CBC and heparin level. 3. Coumadin 12.5 mg x 1 tonight. 4. Daily PT/INR.  Tad Moore, BCPS  Clinical Pharmacist Pager [phone]  02/15/2014 1:37 PM   Addendum:  Pt is now s/p RHC to eval for HF. Plan was to restart heparin 4 hrs post cath.   Plan  Resume heparin at 1350 units/hr Check 6hr heparin level  Ulyses Southward, PharmD Pager: [phone] 02/15/2014 5:56 PM

## 2014-02-15 NOTE — CV Procedure (Signed)
Cardiac Cath Procedure Note:  Indication: Heart failure  Procedures performed:  1) Right heart catheterization  Description of procedure:   The risks and indication of the procedure were explained. Consent was signed and placed on the chart. An appropriate timeout was taken prior to the procedure. The right neck was prepped and draped in the routine sterile fashion and anesthetized with 1% local lidocaine.   A 7 FR venous sheath was placed in the right internal jugular vein using a modified Seldinger technique under u/s guidance. A standard Swan-Ganz catheter was used for the procedure.   Complications: None apparent.  Findings:  RA = 20 RV = 64/10/20 PA = 70/21 (46) PCW = 30 Fick cardiac output/index = 3.4/1.4 PVR = 5.8 Woods  Ao sat = 92% PA sat =  41%, 46%  Assessment: 1. Marked volume overload with low output HF c/w cardiogenic shock 2. Mixed pulmonary HTN  Plan/Discussion:  Transfer to ICU. Start milrinone. Will need PICC line for home inotropes.  Arvilla Meres MD 4:49 PM

## 2014-02-15 NOTE — Care Management Note (Addendum)
    Page 1 of 2   02/22/2014     9:53:50 AM CARE MANAGEMENT NOTE 02/22/2014  Patient:  Raymond [name], Raymond [name]   Account Number:  [id number]  Date Initiated:  02/15/2014  Documentation initiated by:  AMERSON,JULIE  Subjective/Objective Assessment:   Pt adm on 02/14/14 with CHF.  PTA, pt independent, lives with family.     Action/Plan:   Will follow for dc needs as pt progresses.   Anticipated DC Date:  02/22/2014   Anticipated DC Plan:  HOME W HOME HEALTH SERVICES      DC Planning Services  CM consult      [facility] Choice  HOME HEALTH  DURABLE MEDICAL EQUIPMENT   Choice offered to / List presented to:  C-1 Patient   DME arranged  IV PUMP/EQUIPMENT      DME agency  [facility]     [facility] arranged  HH-1 RN  HH-10 DISEASE MANAGEMENT  HH-2 PT  HH-3 OT  HH-6 SOCIAL WORKER  HH-4 NURSE'S AIDE      HH agency  [facility]   Status of service:  Completed, signed off Medicare Important Message given?  YES (If response is "NO", the following Medicare IM given date fields will be blank) Date Medicare IM given:  02/19/2014 Medicare IM given by:  Junius Creamer Date Additional Medicare IM given:  02/22/2014 Additional Medicare IM given by:  Junius Creamer  Discharge Disposition:  HOME [facility] SERVICES  Per UR Regulation:  Reviewed for med. necessity/level of care/duration of stay  If discussed at Long Length of Stay Meetings, dates discussed:   02/19/2014  02/21/2014    Comments:  2/5  0944 debbie Diaz Crago rn,bsn have alerted pam w [facility] that pt for dc today. pam has orders and will bring over and hook pt up prior to disch the milrinone. spoke w [facility] [phone] and they only provide meals for folks over age 73. they will go to director to see if they could make exception for someone w so much disability. gave pt phone number for [facility].  2/4  1212 debbie Colby Catanese rn,bsn spoke w pt. he has concerns about meal prep w limite used of 1  arm from prev stroke. have call into [facility] to see if they could make except for his due to limitations and disability. await call back. alerted [facility] of poss dc on 2-5. md to try and use as many generic meds as poss. have asked pam w [facility] if any cost for milrinone due to pt states cannot pay for med.left inform on needymeds website and inform on [facility] where pt can get several of his meds for 20.00 for 6 month supply.  2/2 1135a debbie Marvette Schamp rn,bsn seen by case Diplomatic Services operational officer. went over list of [facility] agencies and pt chose [facility] for home milrinone. pt states lives alone . he does not have supplement to medicare. he will not qualify for shipp program to assist w meds. cm will cont to follow and assist as pt progresses.

## 2014-02-15 NOTE — Consult Note (Signed)
Neurology Consultation Reason for Consult: Transient unresponsiveness during procedure Referring Physician: Bensimhon, D  CC: Transient episode of unresponsiveness  History is obtained from: Patient  HPI: Raymond [name] is a 54 y.o. male with a history of right MCA stroke with right MCA stent in place. During the procedure, he began coughing and reports that he felt very short of breath and could not get enough oxygen. He subsequently had a period of unresponsiveness. His arms appear to tense up. This lasted for approximately 10 seconds following which he was back to his normal self without significant postictal state.  He states this on a similar has happened to him once before, also approximately 10 seconds.  He has a history of right MCA stroke with left hemiparesis.  ROS: A 14 point ROS was performed and is negative except as noted in the HPI.   Past Medical History  Diagnosis Date  . Non-ischemic cardiomyopathy     a. echo 11/10: EF 15%;   b. cath 10/08: normal cors;  c. Myoview 5/12: Very mild distal ant and apical isch, EF 17% (low risk-medical Rx cont'd);  d.  Echo 4/14:  EF 15%;  e. 05/2009 s/p SJM BiV ICD;  f. 07/2012 TEE: EF 20-25%, diff HK mild MR, mod TR.   Marland Kitchen Atrial fibrillation     a. on coumadin;  b. 07/2012 s/p TEE/DCCV. c. Recurrence 08/2012 in setting of abnormal thyroid panel.; 12/2012 AVN ablation by Dr [name]  . Hypertension   . Obesity   . Non-compliance   . Dyslexia     unable to read.  Marland Kitchen History of CVA (cerebrovascular accident) 11/2008    a. 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 RBBB (right bundle branch block)   . Cough syncope     a. During 08/2012 adm.  . Systolic CHF, chronic 01/19/1996  . Hypothyroidism 01/19/1996    s/p RAI therapy  . Stroke 01/19/2008  . Renal insufficiency     Family History: Stroke  Social History: Tob: Never smoker  Exam: Current vital signs: BP 149/57 mmHg  Pulse 76  Temp(Src) 98.2 F (36.8 C) (Oral)   Resp 27  Wt 129.2 kg (284 lb 13.4 oz)  SpO2 100% Vital signs in last 24 hours: Temp:  [97.3 F (36.3 C)-98.4 F (36.9 C)] 98.2 F (36.8 C) (01/29 1830) Pulse Rate:  [69-77] 76 (01/29 1830) Resp:  [16-27] 27 (01/29 1830) BP: (95-149)/(50-91) 149/57 mmHg (01/29 1830) SpO2:  [95 %-100 %] 100 % (01/29 1830) Weight:  [124.7 kg (274 lb 14.6 oz)-129.2 kg (284 lb 13.4 oz)] 129.2 kg (284 lb 13.4 oz) (01/29 0552)  Physical Exam  Constitutional: Appears well-developed and well-nourished.  Psych: Affect appropriate to situation Eyes: No scleral injection HENT: No OP obstrucion Head: Normocephalic.  Cardiovascular: Normal rate and regular rhythm.  Respiratory: Effort normal GI: Soft.  No distension. There is no tenderness.  Skin: WDI  Neuro: Mental Status: Patient is awake, alert, oriented to person, place, month, year, and situation. Patient is able to give a clear and coherent history. No signs of aphasia or neglect Cranial Nerves: II: Visual Fields are full. Pupils are equal, round, and reactive to light.   III,IV, VI: EOMI without ptosis or diploplia.  V: Facial sensation is symmetric to temperature VII: Facial movement is notable for mild left facial weakness VIII: hearing is intact to voice X: Uvula elevates symmetrically XI: Shoulder shrug is symmetric. XII: tongue is midline without atrophy or  fasciculations.  Motor: Tone is normal. Bulk is normal. 5/5 strength was present on the right side, he has 3-4 out of 5 strength in the left arm with 4 to 4+/5 strength in left leg Sensory: Sensation is diminished on the left Deep Tendon Reflexes: Mildly hyperreflexic on the left    I have reviewed labs in epic and the results pertinent to this consultation are: INR 1.3  I have reviewed the images obtained: CT head-old right MCA stroke with stent, no acute findings  Impression: 54 year old male with transient episode of unresponsiveness in the presence of ongoing procedure and  coughing. I do wonder if he could have transiently lower cardiac output with his coughing akin to Valsalva causing brief syncope. Also possible would be that this represented a seizure, however the duration and lack of postictal period would argue against.  Recommendations: 1) EEG, if EEG is negative then  I do not think I would favor empiric antiepileptic based on semiology 2) neurology will continue to follow.   Ritta Slot, MD [facility] [phone]  If 7pm- 7am, please page neurology on call as listed in AMION.

## 2014-02-15 NOTE — Progress Notes (Signed)
ANTICOAGULATION CONSULT NOTE - Follow Up Consult  Pharmacy Consult for heparin Indication: atrial fibrillation  Labs:  Recent Labs  02/14/14 1524 02/15/14 0500  HGB 14.2 14.2  HCT 43.7 43.5  PLT 237 225  LABPROT 15.3* 16.4*  INR 1.20 1.31  HEPARINUNFRC  --  0.39  CREATININE 1.82*  --     Assessment/Plan:  54yo male therapeutic on heparin with initial dosing for Afib. Will continue gtt at current rate and confirm stable with additional level.   Vernard Gambles, PharmD, BCPS  02/15/2014,7:14 AM

## 2014-02-15 NOTE — Progress Notes (Signed)
  Echocardiogram 2D Echocardiogram has been performed.  Raymond [name] 02/15/2014, 1:15 PM

## 2014-02-15 NOTE — Consult Note (Signed)
Referral received in progression meeting. Met with the patient at bedside to offer [facility] services as benefit of his insurance and primary care provider. Patient agreeable to services and will receive post hospital discharge call and will be evaluated for monthly home visits.  Consent form signed and [place] [place] [facility] information folder with copy given. Inpatient RNCM aware of referral. Of note, [facility] Care Management services does not replace or interfere with any services that are arranged by inpatient case management or social work. For additional questions or referrals please contact, Natividad Brood, RN BSN CCM, [facility] Liaison at [phone].

## 2014-02-15 NOTE — Progress Notes (Addendum)
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   LHC in 2008 with normal cors. Myoview 05/2010: Very mild distal anterior and apical ischemia, EF 17% (low risk-medical therapy continued). Previously on Amiodarone and had very high TSH and was referred to endocrinology. He has had several admissions over the last 6 mos with recurrent atrial fibrillation with RVR and volume overload. He has undergone TEE-DCCV but had return of AFib. He was last admitted 12/21-12/24 with a/c systolic CHF in the setting of AFib with RVR and loss of BiV pacing. AV nodal ablation was recommended for control of refractory atrial fibrillation. He underwent this procedure with Dr. [name] 01/09/13. Amiodarone was discontinued at that time. Tikosyn considered but couldn't afford.  Readmitted on 1/28 for recurrent HF. NYHA IV. At baseline can't walk to bathroom without dyspnea.  Diuresing well but still feels weak. Occasional dyspnea.     Objective:   Weight Range:  Vital Signs:   Temp:  [97.3 F (36.3 C)-98.1 F (36.7 C)] 97.3 F (36.3 C) (01/29 0552) Pulse Rate:  [68-108] 73 (01/29 0552) Resp:  [17-32] 20 (01/29 0552) BP: (91-126)/(50-81) 103/81 mmHg (01/29 0552) SpO2:  [93 %-99 %] 98 % (01/29 0552) Weight:  [124.7 kg (274 lb 14.6 oz)-129.2 kg (284 lb 13.4 oz)] 129.2 kg (284 lb 13.4 oz) (01/29 0552) Last BM Date: 02/14/14  Weight change: Filed Weights   02/14/14 1906 02/15/14 0552  Weight: 124.7 kg (274 lb 14.6 oz) 129.2 kg (284 lb 13.4 oz)    Intake/Output:   Intake/Output Summary (Last 24 hours) at 02/15/14 1018 Last data filed at 02/15/14 0900  Gross per 24 hour  Intake   1739 ml  Output   1950 ml  Net   -211 ml     Physical Exam: General: Chronically ill  appearing. No resp difficulty;  HEENT: normal Neck: supple.JVP jaw Carotids 2+ bilaterally; no bruits. No lymphadenopathy or thryomegaly appreciated. Cor: PMI normal. Regular rate & rhythm. +s3 Lungs: clear Abdomen: Obese; soft, nontender, non distended. No hepatosplenomegaly. No bruits or masses. Good bowel sounds. Extremities: no cyanosis, clubbing, rash, tr edema cool Neuro: alert & orientedx3, cranial nerves grossly intact. LUE weak  Telemetry: AF with v pacing  Labs: Basic Metabolic Panel:  Recent Labs Lab 02/14/14 1524 02/15/14 0500  NA 140 137  K 3.7 3.4*  CL 106 102  CO2 21 22  GLUCOSE 124* 101*  BUN 12 16  CREATININE 1.82* 1.89*  CALCIUM 9.1 9.0    Liver Function Tests: No results for input(s): AST, ALT, ALKPHOS, BILITOT, PROT, ALBUMIN in the last 168 hours. No results for input(s): LIPASE, AMYLASE in the last 168 hours. No results for input(s): AMMONIA in the last 168 hours.  CBC:  Recent Labs Lab 02/14/14 1524 02/15/14 0500  WBC 13.0* 10.0  HGB 14.2 14.2  HCT 43.7 43.5  MCV 93.8 93.8  PLT 237 225    Cardiac Enzymes: No results for input(s): CKTOTAL, CKMB, CKMBINDEX, TROPONINI in the last 168 hours.  BNP: BNP (last 3 results)  Recent Labs  03/14/13 1412 04/02/13 1417 12/26/13 1205  PROBNP 3595.0* 2869.0* 4324.0*     Other results:  :   Imaging: Dg Chest 2 View  02/14/2014   CLINICAL DATA:  Cough and congestion for 2 weeks  EXAM: CHEST  2 VIEW  COMPARISON:  February 26, 2013.  FINDINGS: There is no edema or consolidation. There is moderate generalized cardiac enlargement with pacemaker leads attached to the right atrium and right ventricle. Pulmonary vascularity is within normal limits. No adenopathy. There is slight anterior wedging of 2 lower thoracic vertebral bodies.  IMPRESSION: No edema or consolidation.  Stable cardiac enlargement.   Electronically Signed   By: Bretta Bang M.D.   On: 02/14/2014 16:46      Medications:      Scheduled Medications: . carvedilol  3.125 mg Oral BID WC  . cyclobenzaprine  10 mg Oral BID  . furosemide  80 mg Intravenous BID  . levothyroxine  250 mcg Oral QAC breakfast  . losartan  50 mg Oral Daily  . metolazone  2.5 mg Oral Q M,W,F  . potassium chloride SA  60 mEq Oral QHS  . sodium chloride  3 mL Intravenous Q12H  . spironolactone  25 mg Oral Daily  . Warfarin - Pharmacist Dosing Inpatient   Does not apply q1800     Infusions: . heparin 1,350 Units/hr (02/14/14 2116)     PRN Medications:  sodium chloride, acetaminophen, ALPRAZolam, dextromethorphan, HYDROcodone-acetaminophen, ondansetron (ZOFRAN) IV, sodium chloride   Assessment:   1. A/c systolic HF 2. NICM EF 10% 3. Chronic AF s/p AVN ablation and CRT-D 4. CHD, stage 3 5. H/o CVA 6. Morbid obesity 7. Hypokalemia   Plan/Discussion:     He has low output HF with NYHA IV symptoms in the setting of EF 10%. He is not candidate for advanced therapies due to lack of social support, poor compliance and CKD. Will plan RHC today to assess for possible need for home inotropes.    Length of Stay: 1   Arvilla Meres MD 02/15/2014, 10:18 AM  Advanced Heart Failure Team Pager [phone] (M-F; 7a - 4p)  Please contact [facility] for night-coverage after hours (4p -7a ) and weekends on [email or web address]

## 2014-02-16 ENCOUNTER — Encounter (HOSPITAL_COMMUNITY): Payer: Self-pay | Admitting: Internal Medicine

## 2014-02-16 ENCOUNTER — Inpatient Hospital Stay (HOSPITAL_COMMUNITY): Payer: Medicare Other

## 2014-02-16 DIAGNOSIS — R570 Cardiogenic shock: Secondary | ICD-10-CM

## 2014-02-16 LAB — BASIC METABOLIC PANEL
Anion gap: 9 (ref 5–15)
Anion gap: 4 — ABNORMAL LOW (ref 5–15)
BUN: 20 mg/dL (ref 6–23)
BUN: 20 mg/dL (ref 6–23)
CALCIUM: 8.8 mg/dL (ref 8.4–10.5)
CALCIUM: 9.1 mg/dL (ref 8.4–10.5)
CHLORIDE: 92 mmol/L — AB (ref 96–112)
CO2: 31 mmol/L (ref 19–32)
CO2: 37 mmol/L — AB (ref 19–32)
CREATININE: 1.92 mg/dL — AB (ref 0.50–1.35)
Chloride: 91 mmol/L — ABNORMAL LOW (ref 96–112)
Creatinine, Ser: 2.18 mg/dL — ABNORMAL HIGH (ref 0.50–1.35)
GFR calc Af Amer: 45 mL/min — ABNORMAL LOW (ref >90)
GFR calc Af Amer: 39 mL/min — ABNORMAL LOW (ref >90)
GFR calc non Af Amer: 39 mL/min — ABNORMAL LOW (ref >90)
GFR calc non Af Amer: 33 mL/min — ABNORMAL LOW (ref >90)
GLUCOSE: 119 mg/dL — AB (ref 70–99)
Glucose, Bld: 129 mg/dL — ABNORMAL HIGH (ref 70–99)
POTASSIUM: 3.4 mmol/L — AB (ref 3.5–5.1)
Potassium: 2.9 mmol/L — ABNORMAL LOW (ref 3.5–5.1)
SODIUM: 131 mmol/L — AB (ref 135–145)
SODIUM: 133 mmol/L — AB (ref 135–145)

## 2014-02-16 LAB — CARBOXYHEMOGLOBIN
Carboxyhemoglobin: 1.3 % (ref 0.5–1.5)
METHEMOGLOBIN: 1 % (ref 0.0–1.5)
O2 SAT: 65.3 %
TOTAL HEMOGLOBIN: 14.9 g/dL (ref 13.5–18.0)

## 2014-02-16 LAB — CBC
HEMATOCRIT: 41.8 % (ref 39.0–52.0)
HEMOGLOBIN: 13.8 g/dL (ref 13.0–17.0)
MCH: 30.4 pg (ref 26.0–34.0)
MCHC: 33 g/dL (ref 30.0–36.0)
MCV: 92.1 fL (ref 78.0–100.0)
PLATELETS: 207 10*3/uL (ref 150–400)
RBC: 4.54 MIL/uL (ref 4.22–5.81)
RDW: 14.6 % (ref 11.5–15.5)
WBC: 8.4 10*3/uL (ref 4.0–10.5)

## 2014-02-16 LAB — PROTIME-INR
INR: 1.35 (ref 0.00–1.49)
Prothrombin Time: 16.8 seconds — ABNORMAL HIGH (ref 11.6–15.2)

## 2014-02-16 LAB — HEPARIN LEVEL (UNFRACTIONATED)
HEPARIN UNFRACTIONATED: 0.15 [IU]/mL — AB (ref 0.30–0.70)
Heparin Unfractionated: 0.27 IU/mL — ABNORMAL LOW (ref 0.30–0.70)
Heparin Unfractionated: 0.72 IU/mL — ABNORMAL HIGH (ref 0.30–0.70)

## 2014-02-16 MED ORDER — TORSEMIDE 20 MG PO TABS
20.0000 mg | ORAL_TABLET | Freq: Two times a day (BID) | ORAL | Status: DC
  Administered 2014-02-16 – 2014-02-17 (×2): 20 mg via ORAL
  Filled 2014-02-16 (×4): qty 1

## 2014-02-16 MED ORDER — WARFARIN SODIUM 7.5 MG PO TABS
15.0000 mg | ORAL_TABLET | Freq: Once | ORAL | Status: AC
  Administered 2014-02-16: 15 mg via ORAL
  Filled 2014-02-16: qty 2

## 2014-02-16 MED ORDER — HEPARIN BOLUS VIA INFUSION
3000.0000 [IU] | Freq: Once | INTRAVENOUS | Status: AC
  Administered 2014-02-16: 3000 [IU] via INTRAVENOUS
  Filled 2014-02-16: qty 3000

## 2014-02-16 MED ORDER — POTASSIUM CHLORIDE CRYS ER 20 MEQ PO TBCR
40.0000 meq | EXTENDED_RELEASE_TABLET | Freq: Once | ORAL | Status: AC
  Administered 2014-02-16: 40 meq via ORAL
  Filled 2014-02-16: qty 2

## 2014-02-16 MED ORDER — POTASSIUM CHLORIDE CRYS ER 20 MEQ PO TBCR
40.0000 meq | EXTENDED_RELEASE_TABLET | Freq: Once | ORAL | Status: AC
  Administered 2014-02-16: 40 meq via ORAL

## 2014-02-16 MED ORDER — POTASSIUM CHLORIDE ER 10 MEQ PO TBCR
20.0000 meq | EXTENDED_RELEASE_TABLET | Freq: Two times a day (BID) | ORAL | Status: DC
  Administered 2014-02-17 – 2014-02-18 (×4): 20 meq via ORAL
  Filled 2014-02-16 (×6): qty 2

## 2014-02-16 NOTE — Procedures (Signed)
History: 54 year old male with transient episodes of loss of consciousness  Sedation: None  Technique: This is a 17 channel routine scalp EEG performed at the bedside with bipolar and monopolar montages arranged in accordance to the international 10/20 system of electrode placement. One channel was dedicated to EKG recording.    Background: There is a well defined posterior dominant rhythm of 9 Hz that attenuates with eye opening. The background consist predominantly of intermixed alpha and beta activities. There is occasional mild delta in the right frontal region.  Photic stimulation: Physiologic driving is not performed  EEG Abnormalities: 1) mild right frontal irregular delta  Clinical Interpretation: This EEG is consistent with an area of focal dysfunction in the region of his known infarct. There was no seizure or seizure predisposition recorded on this study. Note that a negative EEG does not preclude the diagnosis of epilepsy.  Ritta Slot, MD [facility] [phone]  If 7pm- 7am, please page neurology on call as listed in AMION.

## 2014-02-16 NOTE — Progress Notes (Addendum)
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   LHC in 2008 with normal cors. Myoview 05/2010: Very mild distal anterior and apical ischemia, EF 17% (low risk-medical therapy continued). Previously on Amiodarone and had very high TSH and was referred to endocrinology. He has had several admissions over the last 6 mos with recurrent atrial fibrillation with RVR and volume overload. He has undergone TEE-DCCV but had return of AFib. He was last admitted 12/21-12/24 with a/c systolic CHF in the setting of AFib with RVR and loss of BiV pacing. AV nodal ablation was recommended for control of refractory atrial fibrillation. He underwent this procedure with Dr. [name] 01/09/13. Amiodarone was discontinued at that time. Tikosyn considered but couldn't afford.  Readmitted on 1/28 for recurrent HF. NYHA IV. At baseline can't walk to bathroom without dyspnea.  RHC on 1/29 showed high filling pressures (PCWP 30) and low output (CI 1.4). Started on milrinone and IV lasix continued. Post cath had ? Brief seizure episdoe on table felt to be due to hypoxia/coughing. Now resolved.   Feels much better. Co-ox 65% Cr stable at 1.9,. Weight down 2 pounds.    Swan numbers done personally CVP 6 PA 51/19 (29) PCWP 17  SVR 1116 Thermo CO/CI 4.9/2.0   Objective:   Weight Range:  Vital Signs:   Temp:  [96.4 F (35.8 C)-98.4 F (36.9 C)] 97.5 F (36.4 C) (01/30 0825) Pulse Rate:  [35-131] 53 (01/30 0612) Resp:  [16-30] 18 (01/30 0612) BP: (95-149)/(42-109) 103/51 mmHg (01/30 0600) SpO2:  [90 %-100 %] 93 % (01/30 0612) Weight:  [126 kg (277 lb 12.5 oz)-126.7 kg (279 lb 5.2 oz)] 126 kg (277 lb 12.5 oz) (01/30 0500) Last BM Date: 02/14/13  Weight change: Filed Weights    02/15/14 0552 02/15/14 1815 02/16/14 0500  Weight: 129.2 kg (284 lb 13.4 oz) 126.7 kg (279 lb 5.2 oz) 126 kg (277 lb 12.5 oz)    Intake/Output:   Intake/Output Summary (Last 24 hours) at 02/16/14 1012 Last data filed at 02/16/14 0600  Gross per 24 hour  Intake  464.5 ml  Output   6885 ml  Net -6420.5 ml     Physical Exam: General: Lying in bed No resp difficulty;  HEENT: normal Neck: supple.RIJ swan Carotids 2+ bilaterally; no bruits. No lymphadenopathy or thryomegaly appreciated. Cor: PMI normal. Regular rate & rhythm. +s3 Lungs: clear Abdomen: Obese; soft, nontender, non distended. No hepatosplenomegaly. No bruits or masses. Good bowel sounds. Extremities: no cyanosis, clubbing, rash, no edema Neuro: alert & orientedx3, cranial nerves grossly intact. LUE weak  Telemetry: AF with v pacing with frequent PVCs  Labs: Basic Metabolic Panel:  Recent Labs Lab 02/14/14 1524 02/15/14 0500 02/16/14 0600  NA 140 137 131*  K 3.7 3.4* 2.9*  CL 106 102 91*  CO2 21 22 31   GLUCOSE 124* 101* 129*  BUN 12 16 20   CREATININE 1.82* 1.89* 1.92*  CALCIUM 9.1 9.0 8.8    Liver Function Tests: No results for input(s): AST, ALT, ALKPHOS, BILITOT, PROT, ALBUMIN in the last 168 hours. No results for input(s): LIPASE, AMYLASE in the last 168 hours. No results for input(s): AMMONIA in the last 168 hours.  CBC:  Recent Labs Lab 02/14/14 1524 02/15/14 0500  02/16/14 0600  WBC 13.0* 10.0 8.4  HGB 14.2 14.2 13.8  HCT 43.7 43.5 41.8  MCV 93.8 93.8 92.1  PLT 237 225 207    Cardiac Enzymes: No results for input(s): CKTOTAL, CKMB, CKMBINDEX, TROPONINI in the last 168 hours.  BNP: BNP (last 3 results)  Recent Labs  03/14/13 1412 04/02/13 1417 12/26/13 1205  PROBNP 3595.0* 2869.0* 4324.0*     Other results:  :   Imaging: Dg Chest 2 View  02/14/2014   CLINICAL DATA:  Cough and congestion for 2 weeks  EXAM: CHEST  2 VIEW  COMPARISON:  February 26, 2013.  FINDINGS: There is  no edema or consolidation. There is moderate generalized cardiac enlargement with pacemaker leads attached to the right atrium and right ventricle. Pulmonary vascularity is within normal limits. No adenopathy. There is slight anterior wedging of 2 lower thoracic vertebral bodies.  IMPRESSION: No edema or consolidation.  Stable cardiac enlargement.   Electronically Signed   By: Bretta Bang M.D.   On: 02/14/2014 16:46   Ct Head Wo Contrast  02/15/2014   CLINICAL DATA:  Code stroke after vascular interventional case. Visual loss.  EXAM: CT HEAD WITHOUT CONTRAST  TECHNIQUE: Contiguous axial images were obtained from the base of the skull through the vertex without intravenous contrast.  COMPARISON:  09/17/2009.  11/19/2008.  FINDINGS: The patient has a stent in the right middle cerebral artery. The brainstem and cerebellum are unremarkable. The left cerebral hemisphere shows chronic appearing small vessel ischemic changes within the deep white matter, progressive since 2011. No sign of acute infarction on the left. The right hemisphere shows chronic infarction in the insula, posterior frontal and parietal region with atrophy, encephalomalacia and gliosis, more extensive than was seen on the study of 2011. Chronic infarction also affects the right basal ganglia and the right thalamus. The findings have a chronic appearance however. There is no sign of acute infarction, mass lesion, hemorrhage, obstructive hydrocephalus or subdural collection.  IMPRESSION: No identifiable acute or subacute insult. Chronic right MCA stent. Chronic infarction in the right MCA territory affecting the insular region, posterior frontal and parietal region as well as the right basal ganglia and right thalamus. These chronic appearing ischemic changes are more extensive than that were in 2011.   Electronically Signed   By: Paulina Fusi M.D.   On: 02/15/2014 18:06   Dg Chest Port 1 View  02/16/2014   CLINICAL DATA:  Swan-Ganz  placement after retraction under fluoroscopy.  EXAM: PORTABLE CHEST - 1 VIEW  COMPARISON:  01/20/2014  FINDINGS: Cardiac pacemaker with lead tips unchanged. Right Swan-Ganz catheter with tip projected over the pulmonary outflow tract and directed towards the right main pulmonary artery. Cardiac enlargement. No significant pulmonary vascular congestion. No blunting of costophrenic angles. No pneumothorax. No focal consolidation in the lungs.  IMPRESSION: Swan-Ganz catheter tip over the right main pulmonary artery. No pneumothorax. Cardiac enlargement.   Electronically Signed   By: Burman Nieves M.D.   On: 02/16/2014 03:32   Dg Chest Port 1 View  02/16/2014   CLINICAL DATA:  Swan-Ganz position  EXAM: PORTABLE CHEST - 1 VIEW  COMPARISON:  02/14/2014  FINDINGS: Swan-Ganz catheter with the tip projecting over the right interlobar pulmonary artery. There is no focal parenchymal opacity, pleural effusion, or pneumothorax. There is stable cardiomegaly. There is a cardiac pacer noted.  The osseous structures are unremarkable.  IMPRESSION: Swan-Ganz catheter with the tip projecting over the right interlobar pulmonary artery.   Electronically Signed  By: Elige Ko   On: 02/16/2014 02:10     Medications:     Scheduled Medications: . carvedilol  3.125 mg Oral BID WC  . cyclobenzaprine  10 mg Oral BID  . furosemide  80 mg Intravenous BID  . levothyroxine  250 mcg Oral QAC breakfast  . losartan  50 mg Oral Daily  . metolazone  2.5 mg Oral Q M,W,F  . potassium chloride SA  60 mEq Oral QHS  . sodium chloride  3 mL Intravenous Q12H  . sodium chloride  3 mL Intravenous Q12H  . spironolactone  25 mg Oral Daily  . warfarin  12.5 mg Oral ONCE-1800  . warfarin  15 mg Oral ONCE-1800  . Warfarin - Pharmacist Dosing Inpatient   Does not apply q1800    Infusions: . heparin 1,700 Units/hr (02/16/14 0905)  . milrinone 0.25 mcg/kg/min (02/15/14 1833)    PRN Medications: sodium chloride, sodium chloride,  acetaminophen, acetaminophen, ALPRAZolam, dextromethorphan, HYDROcodone-acetaminophen, ondansetron (ZOFRAN) IV, sodium chloride, sodium chloride   Assessment:   1. A/c systolic HF 2. NICM EF 10% 3. Chronic AF s/p AVN ablation and CRT-D 4. CHD, stage 3 5. H/o CVA 6. Morbid obesity 7. Hypokalemia 8 Cardiogenic shock   Plan/Discussion:     He has cardiogenic shock physiology with NYHA IV symptoms in the setting of EF 10%. He is not candidate for advanced therapies due to lack of social support, poor compliance and CKD. Much improved with milrinone. Will require home inotropes. Will plan on PICC placement. Will discuss with PICC team. Ideally would place in L arm so he can manipulate it with his functional R arm however ICD is on left. May need tunneled PICC in RIJ. Supp K+. Change lasix to po.   He is having extensive PVCs. May compromise cardiac output. Will interrogate ICD and consider po amio. Loading coumadin. INR 1.35   The patient is critically ill with multiple organ systems failure and requires high complexity decision making for assessment and support, frequent evaluation and titration of therapies, application of advanced monitoring technologies and extensive interpretation of multiple databases.   Critical Care Time devoted to patient care services described in this note is 45 Minutes.    Length of Stay: 2 Arvilla Meres MD 02/16/2014, 10:12 AM  Advanced Heart Failure Team Pager [phone] (M-F; 7a - 4p)  Please contact [facility] for night-coverage after hours (4p -7a ) and weekends on [email or web address]

## 2014-02-16 NOTE — Progress Notes (Signed)
ANTICOAGULATION CONSULT NOTE - Follow Up Consult  Pharmacy Consult for Heparin Indication: atrial fibrillation  Allergies  Allergen Reactions  . Valsartan Cough    Patient Measurements: Height: 5\' 9"  (175.3 cm) Weight: 277 lb 12.5 oz (126 kg) IBW/kg (Calculated) : 70.7 Heparin Dosing Weight: ~104 kg  Vital Signs: Temp: 98.6 F (37 C) (01/30 2016) BP: 91/60 mmHg (01/30 2016) Pulse Rate: 70 (01/30 2016)  Labs:  Recent Labs  02/14/14 1524 02/15/14 0500  02/16/14 0600 02/16/14 1611 02/16/14 2100  HGB 14.2 14.2  --  13.8  --   --   HCT 43.7 43.5  --  41.8  --   --   PLT 237 225  --  207  --   --   LABPROT 15.3* 16.4*  --  16.8*  --   --   INR 1.20 1.31  --  1.35  --   --   HEPARINUNFRC  --  0.39  < > 0.15* 0.27* 0.72*  CREATININE 1.82* 1.89*  --  1.92* 2.18*  --   < > = values in this interval not displayed.  Estimated Creatinine Clearance: 52.6 mL/min (by C-G formula based on Cr of 2.18).   Medications:  Infusions:  . heparin 1,700 Units/hr (02/16/14 1247)  . milrinone 0.25 mcg/kg/min (02/16/14 1234)    Assessment: 54 yo male admitted with HF.  On chronic Coumadin for afib, INR subtherapeutic on admission, question compliance.  Pharmacy asked to begin bridging with IV heparin.  Heparin level was therapeutic x 2 on heparin at 1350 units/hr, then held for cath. IV heparin is now up to 1700 units/hr and HL remains sub-therapeutic at 0.27. On speaking with RN, she states that she bolused the patient at ~1245 but did not increase infusion rate till closer to 2 PM. This could explain why heparin level is slightly sub-therapeutic.   Repeat HL is slightly supratherapeutic at 0.72 on heparin 1700 units/hr. No bleeding noted by RN.  Goal of Therapy:  Heparin level 0.3-0.7 units/ml  INR 2-3 Monitor platelets by anticoagulation protocol: Yes   Plan:  - Decrease heparin to 1600 units/hr.  - Daily CBC, INR, and heparin level - Monitor s/sx of bleeding  Arlean Hopping. Newman Pies,  PharmD Clinical Pharmacist Pager [phone]

## 2014-02-16 NOTE — Progress Notes (Signed)
EEG Completed; Results Pending  

## 2014-02-16 NOTE — Progress Notes (Signed)
Subjective: Patient had another episode this morning. States that he was coughing violently because he felt like he was choking.   Exam: Filed Vitals:   02/16/14 0600  BP: 103/51  Pulse: 85  Temp: 96.4 F (35.8 C)  Resp: 25   Gen: In bed, NAD MS: awake, alert, interactive and appropriate CN:VFF, EOMI Motor: 5/5 on right, 4+/5 LLE, 4-/5 LUE Sensory:symmetric to LT   Impression: 54 yo M with recurrent syncope, always in the setting of coughing. While seizure remains possible, especially given his history of stroke, the short duration and lack of post-ictal phase are unusual. I do wonder if he could be reducing preload with recurrent coughing causing decreased cardiac output and syncope rather than seizure. I do favor an EEG.   Recommendations: 1) EEG 2) will continue to follow.   Ritta Slot, MD [facility] [phone]  If 7pm- 7am, please page neurology on call as listed in AMION.

## 2014-02-16 NOTE — Progress Notes (Signed)
ANTICOAGULATION CONSULT NOTE - Follow Up Consult  Pharmacy Consult for Heparin/Coumadin Indication: atrial fibrillation  Allergies  Allergen Reactions  . Valsartan Cough    Patient Measurements: Height: 5\' 9"  (175.3 cm) Weight: 277 lb 12.5 oz (126 kg) IBW/kg (Calculated) : 70.7 Heparin Dosing Weight: ~104 kg  Vital Signs: Temp: 96.6 F (35.9 C) (01/30 0612) Temp Source: Core (Comment) (01/30 0400) BP: 103/51 mmHg (01/30 0600) Pulse Rate: 53 (01/30 0612)  Labs:  Recent Labs  02/14/14 1524 02/15/14 0500 02/15/14 1055 02/16/14 0600  HGB 14.2 14.2  --  13.8  HCT 43.7 43.5  --  41.8  PLT 237 225  --  207  LABPROT 15.3* 16.4*  --  16.8*  INR 1.20 1.31  --  1.35  HEPARINUNFRC  --  0.39 0.48 0.15*  CREATININE 1.82* 1.89*  --  1.92*    Estimated Creatinine Clearance: 59.7 mL/min (by C-G formula based on Cr of 1.92).   Medications:  Infusions:  . heparin 1,350 Units/hr (02/15/14 2100)  . milrinone 0.25 mcg/kg/min (02/15/14 1833)    Assessment: 54 yo male admitted with HF.  On chronic Coumadin for afib, INR subtherapeutic on admission, question compliance.  Pharmacy asked to begin bridging with IV heparin.  Heparin level was therapeutic x 2 on heparin at 1350 units/hr, then held for cath. Restarted again on 1350 units/hr with SUBtherapeutic heparin level today at 0.15. Per RN, no issues with line, interruption in therapy, bleeding or complications. CBC stable.   INR 1.31>>1.35 today. Dose was ordered for 1/29 but never given. Pt has only received 15mg  on 1/28.     Coumadin dose PTA 7.5 mg daily with 11.25 on Mon, Thurs, Sat.    Goal of Therapy:  Heparin level 0.3-0.7 units/ml  INR 2-3 Monitor platelets by anticoagulation protocol: Yes   Plan:  - Bolus heparin 3,000 units - Increase heparin to 1,700 units/hr - Check heparin level at 1500 - Coumadin 15mg  x1 - Daily CBC, INR, and heparin level  Megan E. Supple, Pharm.D Clinical Pharmacy Resident Pager:  [phone] 02/16/2014 8:22 AM

## 2014-02-16 NOTE — Progress Notes (Signed)
ANTICOAGULATION CONSULT NOTE - Follow Up Consult  Pharmacy Consult for Heparin Indication: atrial fibrillation  Allergies  Allergen Reactions  . Valsartan Cough    Patient Measurements: Height: 5\' 9"  (175.3 cm) Weight: 277 lb 12.5 oz (126 kg) IBW/kg (Calculated) : 70.7 Heparin Dosing Weight: ~104 kg  Vital Signs: Temp: 98.2 F (36.8 C) (01/30 1800) BP: 93/41 mmHg (01/30 1800) Pulse Rate: 76 (01/30 1800)  Labs:  Recent Labs  02/14/14 1524  02/15/14 0500 02/15/14 1055 02/16/14 0600 02/16/14 1611  HGB 14.2  --  14.2  --  13.8  --   HCT 43.7  --  43.5  --  41.8  --   PLT 237  --  225  --  207  --   LABPROT 15.3*  --  16.4*  --  16.8*  --   INR 1.20  --  1.31  --  1.35  --   HEPARINUNFRC  --   < > 0.39 0.48 0.15* 0.27*  CREATININE 1.82*  --  1.89*  --  1.92* 2.18*  < > = values in this interval not displayed.  Estimated Creatinine Clearance: 52.6 mL/min (by C-G formula based on Cr of 2.18).   Medications:  Infusions:  . heparin 1,700 Units/hr (02/16/14 1247)  . milrinone 0.25 mcg/kg/min (02/16/14 1234)    Assessment: 54 yo male admitted with HF.  On chronic Coumadin for afib, INR subtherapeutic on admission, question compliance.  Pharmacy asked to begin bridging with IV heparin.  Heparin level was therapeutic x 2 on heparin at 1350 units/hr, then held for cath. IV heparin is now up to 1700 units/hr and HL remains sub-therapeutic at 0.27. On speaking with RN, she states that she bolused the patient at ~1245 but did not increase infusion rate till closer to 2 PM. This could explain why heparin level is slightly sub-therapeutic.   Goal of Therapy:  Heparin level 0.3-0.7 units/ml  INR 2-3 Monitor platelets by anticoagulation protocol: Yes   Plan:  - Continue heparin at 1,700 units/hr. Redraw heparin level at 2100 - Daily CBC, INR, and heparin level  Vinnie Level, PharmD., BCPS Clinical Pharmacist Pager [phone]

## 2014-02-16 NOTE — Progress Notes (Signed)
Called to bedside because PAC showed damped tracing. Obtained CXR that showed that the tip was in the distal PA. I pulled back the PAC to about 47cm to main PA. I was unable to obtain a good tracing but the PA diastolic is 32. CXR obtained after completion of the procedure. Can attempt to rewedge in AM if indicated.  Sammuel Hines, MD MPH 3:08am

## 2014-02-16 NOTE — Progress Notes (Signed)
Patient's Swan-Ganz waveform dampened. Attempted to flush, re-zero and level transducer with no change. CVP line unable to draw back blood. Dr. Ace Gins notified and chest x-ray ordered to check placement. Will continue to monitor.

## 2014-02-16 NOTE — Progress Notes (Signed)
During chest x-ray patient had coughing spell followed by a few seconds of unconsciousness. Patient now alert and oriented. Vital signs stable HR 74, BP 110/84, O2 99%. Dr. Ace Gins notified. Will continue to montor.

## 2014-02-17 ENCOUNTER — Encounter (HOSPITAL_COMMUNITY): Payer: Self-pay | Admitting: *Deleted

## 2014-02-17 DIAGNOSIS — N189 Chronic kidney disease, unspecified: Secondary | ICD-10-CM

## 2014-02-17 DIAGNOSIS — N179 Acute kidney failure, unspecified: Secondary | ICD-10-CM | POA: Insufficient documentation

## 2014-02-17 LAB — CARBOXYHEMOGLOBIN
CARBOXYHEMOGLOBIN: 1.2 % (ref 0.5–1.5)
Carboxyhemoglobin: 1.1 % (ref 0.5–1.5)
Methemoglobin: 1 % (ref 0.0–1.5)
Methemoglobin: 1 % (ref 0.0–1.5)
O2 SAT: 65.1 %
O2 SAT: 56.2 %
TOTAL HEMOGLOBIN: 13.7 g/dL (ref 13.5–18.0)
TOTAL HEMOGLOBIN: 14.3 g/dL (ref 13.5–18.0)

## 2014-02-17 LAB — BASIC METABOLIC PANEL
ANION GAP: 10 (ref 5–15)
BUN: 26 mg/dL — AB (ref 6–23)
CALCIUM: 8.7 mg/dL (ref 8.4–10.5)
CO2: 27 mmol/L (ref 19–32)
Chloride: 92 mmol/L — ABNORMAL LOW (ref 96–112)
Creatinine, Ser: 2.54 mg/dL — ABNORMAL HIGH (ref 0.50–1.35)
GFR calc Af Amer: 32 mL/min — ABNORMAL LOW (ref >90)
GFR calc non Af Amer: 28 mL/min — ABNORMAL LOW (ref >90)
GLUCOSE: 145 mg/dL — AB (ref 70–99)
POTASSIUM: 3.9 mmol/L (ref 3.5–5.1)
Sodium: 129 mmol/L — ABNORMAL LOW (ref 135–145)

## 2014-02-17 LAB — PROTIME-INR
INR: 1.29 (ref 0.00–1.49)
Prothrombin Time: 16.3 seconds — ABNORMAL HIGH (ref 11.6–15.2)

## 2014-02-17 LAB — CBC
HCT: 40.7 % (ref 39.0–52.0)
Hemoglobin: 13.7 g/dL (ref 13.0–17.0)
MCH: 30.6 pg (ref 26.0–34.0)
MCHC: 33.7 g/dL (ref 30.0–36.0)
MCV: 91.1 fL (ref 78.0–100.0)
Platelets: 202 10*3/uL (ref 150–400)
RBC: 4.47 MIL/uL (ref 4.22–5.81)
RDW: 14.4 % (ref 11.5–15.5)
WBC: 8.4 10*3/uL (ref 4.0–10.5)

## 2014-02-17 LAB — HEPARIN LEVEL (UNFRACTIONATED): Heparin Unfractionated: 0.54 IU/mL (ref 0.30–0.70)

## 2014-02-17 MED ORDER — WARFARIN SODIUM 7.5 MG PO TABS
15.0000 mg | ORAL_TABLET | Freq: Once | ORAL | Status: DC
  Filled 2014-02-17: qty 2

## 2014-02-17 NOTE — Progress Notes (Signed)
ANTICOAGULATION CONSULT NOTE - Follow Up Consult  Pharmacy Consult for Heparin/Coumadin Indication: atrial fibrillation  Allergies  Allergen Reactions  . Valsartan Cough    Patient Measurements: Height: 5\' 9"  (175.3 cm) Weight: 282 lb 6.4 oz (128.096 kg) IBW/kg (Calculated) : 70.7 Heparin Dosing Weight: ~104 kg  Vital Signs: Temp: 96.8 F (36 C) (01/31 0615) BP: 108/70 mmHg (01/31 0615) Pulse Rate: 48 (01/31 0615)  Labs:  Recent Labs  02/15/14 0500  02/16/14 0600 02/16/14 1611 02/16/14 2100 02/17/14 0505  HGB 14.2  --  13.8  --   --  13.7  HCT 43.5  --  41.8  --   --  40.7  PLT 225  --  207  --   --  202  LABPROT 16.4*  --  16.8*  --   --  16.3*  INR 1.31  --  1.35  --   --  1.29  HEPARINUNFRC 0.39  < > 0.15* 0.27* 0.72* 0.54  CREATININE 1.89*  --  1.92* 2.18*  --  2.54*  < > = values in this interval not displayed.  Estimated Creatinine Clearance: 45.6 mL/min (by C-G formula based on Cr of 2.54).   Medications:  Infusions:  . heparin 1,600 Units/hr (02/17/14 0533)  . milrinone 0.25 mcg/kg/min (02/16/14 2235)    Assessment: 54 yo male admitted with HF. On chronic Coumadin for afib, INR subtherapeutic on admission, question compliance.  Pharmacy to bridge with IV heparin.  Heparin level therapeutic at 0.54 @ 1,600 units/hr. CBC stable.   INR 1.31>>1.35>>1.29 today. Seeing effects from dose that was never administered on 1/28.   Coumadin dose PTA 7.5 mg daily with 11.25 on Mon, Thurs, Sat.    Goal of Therapy:  Heparin level 0.3-0.7 units/ml  INR 2-3 Monitor platelets by anticoagulation protocol: Yes   Plan:  - Continue heparin @ 1,600 units/hr - Coumadin 15mg  x1 - Daily CBC, INR, and heparin level  Valmai Vandenberghe E. Gleen Ripberger, Pharm.D Clinical Pharmacy Resident Pager: [phone] 02/17/2014 7:12 AM

## 2014-02-17 NOTE — Progress Notes (Signed)
Utilization review completed.  

## 2014-02-17 NOTE — Progress Notes (Signed)
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   LHC in 2008 with normal cors. Myoview 05/2010: Very mild distal anterior and apical ischemia, EF 17% (low risk-medical therapy continued). Previously on Amiodarone and had very high TSH and was referred to endocrinology. He has had several admissions over the last 6 mos with recurrent atrial fibrillation with RVR and volume overload. He has undergone TEE-DCCV but had return of AFib. He was last admitted 12/21-12/24 with a/c systolic CHF in the setting of AFib with RVR and loss of BiV pacing. AV nodal ablation was recommended for control of refractory atrial fibrillation. He underwent this procedure with Dr. [name] 01/09/13. Amiodarone was discontinued at that time. Tikosyn considered but couldn't afford.  Readmitted on 1/28 for recurrent HF. NYHA IV. At baseline can't walk to bathroom without dyspnea.  RHC on 1/29 showed high filling pressures (PCWP 30) and low output (CI 1.4). Started on milrinone and IV lasix continued. Post cath had ? Brief seizure episdoe on table felt to be due to hypoxia/coughing. Now resolved.   Lasix switched to torsemide yesterday. Worse today. More SOB/coughing. Cr up from 1.9-> 2.5. Weight up 5 pounds??    Ernestine Conrad numbers done personally CVP 6                --> 9 PA 51/19 (29)    --> 59/22 (35) PCWP 17          --> 29        SVR 1116          --> 1715    Thermo CO/CI 4.9/2.0  --> 3.5/1.4 Co-ox 65%   Objective:   Weight Range:  Vital Signs:   Temp:  [96.3 F (35.7 C)-98.6 F (37 C)] 96.8 F (36 C) (01/31 1006) Pulse Rate:  [28-193] 70 (01/31 0748) Resp:  [13-27] 17 (01/31 1006) BP: (76-123)/(35-96) 110/70 mmHg (01/31 1006) SpO2:  [88 %-100 %] 98 % (01/31 0748) Weight:   [128.096 kg (282 lb 6.4 oz)] 128.096 kg (282 lb 6.4 oz) (01/31 0257) Last BM Date: 02/14/14  Weight change: Filed Weights   02/15/14 1815 02/16/14 0500 02/17/14 0257  Weight: 126.7 kg (279 lb 5.2 oz) 126 kg (277 lb 12.5 oz) 128.096 kg (282 lb 6.4 oz)    Intake/Output:   Intake/Output Summary (Last 24 hours) at 02/17/14 1158 Last data filed at 02/17/14 1100  Gross per 24 hour  Intake 2383.7 ml  Output   2900 ml  Net -516.3 ml     Physical Exam: General: Lying in bed + cough HEENT: normal Neck: supple.RIJ swan Carotids 2+ bilaterally; no bruits. No lymphadenopathy or thryomegaly appreciated. Cor: PMI normal. Regular rate & rhythm. +s3 Lungs: clear Abdomen: Obese; soft, nontender, non distended. No hepatosplenomegaly. No bruits or masses. Good bowel sounds. Extremities: no cyanosis, clubbing, rash, tr edema Neuro: alert & orientedx3, cranial nerves grossly intact. LUE weak  Telemetry: AF with v pacing with frequent PVCs  Labs: Basic Metabolic Panel:  Recent Labs Lab 02/14/14 1524 02/15/14 0500 02/16/14 0600 02/16/14 1611 02/17/14 0505  NA 140 137 131* 133* 129*  K 3.7 3.4* 2.9* 3.4* 3.9  CL 106 102 91* 92* 92*  CO2 21 22 31  37* 27  GLUCOSE 124* 101*  129* 119* 145*  BUN 26*  CREATININE 1.82* 1.89* 1.92* 2.18* 2.54*  CALCIUM 9.1 9.0 8.8 9.1 8.7    Liver Function Tests: No results for input(s): AST, ALT, ALKPHOS, BILITOT, PROT, ALBUMIN in the last 168 hours. No results for input(s): LIPASE, AMYLASE in the last 168 hours. No results for input(s): AMMONIA in the last 168 hours.  CBC:  Recent Labs Lab 02/14/14 1524 02/15/14 0500 02/16/14 0600 02/17/14 0505  WBC 13.0* 10.0 8.4 8.4  HGB 14.2 14.2 13.8 13.7  HCT 43.7 43.5 41.8 40.7  MCV 93.8 93.8 92.1 91.1  PLT 237 225 207 202    Cardiac Enzymes: No results for input(s): CKTOTAL, CKMB, CKMBINDEX, TROPONINI in the last 168 hours.  BNP: BNP (last 3 results)  Recent Labs  03/14/13 1412  04/02/13 1417 12/26/13 1205  PROBNP 3595.0* 2869.0* 4324.0*     Other results:  :   Imaging: Ct Head Wo Contrast  02/15/2014   CLINICAL DATA:  Code stroke after vascular interventional case. Visual loss.  EXAM: CT HEAD WITHOUT CONTRAST  TECHNIQUE: Contiguous axial images were obtained from the base of the skull through the vertex without intravenous contrast.  COMPARISON:  09/17/2009.  11/19/2008.  FINDINGS: The patient has a stent in the right middle cerebral artery. The brainstem and cerebellum are unremarkable. The left cerebral hemisphere shows chronic appearing small vessel ischemic changes within the deep white matter, progressive since 2011. No sign of acute infarction on the left. The right hemisphere shows chronic infarction in the insula, posterior frontal and parietal region with atrophy, encephalomalacia and gliosis, more extensive than was seen on the study of 2011. Chronic infarction also affects the right basal ganglia and the right thalamus. The findings have a chronic appearance however. There is no sign of acute infarction, mass lesion, hemorrhage, obstructive hydrocephalus or subdural collection.  IMPRESSION: No identifiable acute or subacute insult. Chronic right MCA stent. Chronic infarction in the right MCA territory affecting the insular region, posterior frontal and parietal region as well as the right basal ganglia and right thalamus. These chronic appearing ischemic changes are more extensive than that were in 2011.   Electronically Signed   By: Paulina Fusi M.D.   On: 02/15/2014 18:06   Dg Chest Port 1 View  02/16/2014   CLINICAL DATA:  Swan-Ganz placement after retraction under fluoroscopy.  EXAM: PORTABLE CHEST - 1 VIEW  COMPARISON:  01/20/2014  FINDINGS: Cardiac pacemaker with lead tips unchanged. Right Swan-Ganz catheter with tip projected over the pulmonary outflow tract and directed towards the right main pulmonary artery. Cardiac enlargement. No significant pulmonary  vascular congestion. No blunting of costophrenic angles. No pneumothorax. No focal consolidation in the lungs.  IMPRESSION: Swan-Ganz catheter tip over the right main pulmonary artery. No pneumothorax. Cardiac enlargement.   Electronically Signed   By: Burman Nieves M.D.   On: 02/16/2014 03:32   Dg Chest Port 1 View  02/16/2014   CLINICAL DATA:  Swan-Ganz position  EXAM: PORTABLE CHEST - 1 VIEW  COMPARISON:  02/14/2014  FINDINGS: Swan-Ganz catheter with the tip projecting over the right interlobar pulmonary artery. There is no focal parenchymal opacity, pleural effusion, or pneumothorax. There is stable cardiomegaly. There is a cardiac pacer noted.  The osseous structures are unremarkable.  IMPRESSION: Swan-Ganz catheter with the tip projecting over the right interlobar pulmonary artery.   Electronically Signed   By: Elige Ko   On: 02/16/2014 02:10   Dg Fluoro Guide Cv Line-no  Report  02/17/2014   CLINICAL DATA:    FLOURO GUIDE CV LINE  Fluoroscopy was utilized by the requesting physician.  No radiographic  interpretation.      Medications:     Scheduled Medications: . cyclobenzaprine  10 mg Oral BID  . levothyroxine  250 mcg Oral QAC breakfast  . losartan  50 mg Oral Daily  . potassium chloride  20 mEq Oral BID  . potassium chloride SA  60 mEq Oral QHS  . sodium chloride  3 mL Intravenous Q12H  . sodium chloride  3 mL Intravenous Q12H  . spironolactone  25 mg Oral Daily  . torsemide  20 mg Oral BID  . warfarin  15 mg Oral ONCE-1800  . Warfarin - Pharmacist Dosing Inpatient   Does not apply q1800    Infusions: . heparin 1,600 Units/hr (02/17/14 0533)  . milrinone 0.25 mcg/kg/min (02/17/14 0730)    PRN Medications: sodium chloride, sodium chloride, acetaminophen, acetaminophen, ALPRAZolam, dextromethorphan, HYDROcodone-acetaminophen, ondansetron (ZOFRAN) IV, sodium chloride, sodium chloride   Assessment:   1. A/c systolic HF 2. NICM EF 10% 3. Chronic AF s/p AVN ablation  and CRT-D 4. Acute on chronic renal failure stage IV 5. H/o CVA 6. Morbid obesity 7. Hypokalemia/hyponatremia 8 Cardiogenic shock   Plan/Discussion:    He is worse today despite milrinone support.  Suspect worsening renal failure may be due to shock and recent hypotension with ATN. Will resume lasix. Increase milrinone to 0.375. Stop losartan. Can add levophed as needed.  Unfortunately he is not candidate for advanced therapies due to lack of social support, poor compliance and CKD. Will require home inotropes once stabilized. When swan comes out will place tunneled PICC in RIJ.   He is having extensive PVCs. May compromise cardiac output. Will interrogate ICD and consider po amio. Hold coumadin for now as may need IABP.   The patient is critically ill with multiple organ systems failure and requires high complexity decision making for assessment and support, frequent evaluation and titration of therapies, application of advanced monitoring technologies and extensive interpretation of multiple databases.   Critical Care Time devoted to patient care services described in this note is 45 Minutes.  Length of Stay: 3 Arvilla Meres MD 02/17/2014, 11:58 AM  Advanced Heart Failure Team Pager [phone] (M-F; 7a - 4p)  Please contact [facility] for night-coverage after hours (4p -7a ) and weekends on [email or web address]

## 2014-02-17 NOTE — Progress Notes (Signed)
Subjective: No further episodes.   Exam: Filed Vitals:   02/17/14 0700  BP:   Pulse: 68  Temp: 96.6 F (35.9 C)  Resp: 16   Gen: In bed, NAD MS: awake, alert, interactive and appropriate CN:VFF, EOMI Motor: 5/5 on right, left hemiparesis Sensory:symmetric to LT   Impression: 54 yo M with recurrent syncope, always in the setting of coughing. While seizure remains possible, especially given his history of stroke, the short duration and lack of post-ictal phase are unusual. I do wonder if he could be reducing preload with recurrent coughing causing decreased cardiac output and syncope rather than seizure. With no predisposition by EEG, I would not favor AED therapy.   Recommendations: 1) No further recommendations at this time, please call with any further questions.   Ritta Slot, MD [facility] [phone]  If 7pm- 7am, please page neurology on call as listed in AMION.

## 2014-02-18 ENCOUNTER — Inpatient Hospital Stay (HOSPITAL_COMMUNITY): Payer: Medicare Other

## 2014-02-18 LAB — CBC
HEMATOCRIT: 41.2 % (ref 39.0–52.0)
HEMOGLOBIN: 13.9 g/dL (ref 13.0–17.0)
MCH: 30.7 pg (ref 26.0–34.0)
MCHC: 33.7 g/dL (ref 30.0–36.0)
MCV: 90.9 fL (ref 78.0–100.0)
PLATELETS: 188 10*3/uL (ref 150–400)
RBC: 4.53 MIL/uL (ref 4.22–5.81)
RDW: 14.4 % (ref 11.5–15.5)
WBC: 8.2 10*3/uL (ref 4.0–10.5)

## 2014-02-18 LAB — MAGNESIUM: Magnesium: 1.7 mg/dL (ref 1.5–2.5)

## 2014-02-18 LAB — BASIC METABOLIC PANEL
Anion gap: 7 (ref 5–15)
BUN: 24 mg/dL — AB (ref 6–23)
CHLORIDE: 96 mmol/L (ref 96–112)
CO2: 28 mmol/L (ref 19–32)
Calcium: 8.9 mg/dL (ref 8.4–10.5)
Creatinine, Ser: 2.07 mg/dL — ABNORMAL HIGH (ref 0.50–1.35)
GFR, EST AFRICAN AMERICAN: 41 mL/min — AB (ref >90)
GFR, EST NON AFRICAN AMERICAN: 35 mL/min — AB (ref >90)
GLUCOSE: 133 mg/dL — AB (ref 70–99)
POTASSIUM: 4.1 mmol/L (ref 3.5–5.1)
Sodium: 131 mmol/L — ABNORMAL LOW (ref 135–145)

## 2014-02-18 LAB — CARBOXYHEMOGLOBIN
Carboxyhemoglobin: 1.5 % (ref 0.5–1.5)
Carboxyhemoglobin: 0.9 % (ref 0.5–1.5)
Methemoglobin: 0.8 % (ref 0.0–1.5)
Methemoglobin: 0.8 % (ref 0.0–1.5)
O2 SAT: 70.9 %
O2 Saturation: 70 %
TOTAL HEMOGLOBIN: 9.5 g/dL — AB (ref 13.5–18.0)
Total hemoglobin: 14.4 g/dL (ref 13.5–18.0)

## 2014-02-18 LAB — PROTIME-INR
INR: 1.35 (ref 0.00–1.49)
Prothrombin Time: 16.8 seconds — ABNORMAL HIGH (ref 11.6–15.2)

## 2014-02-18 LAB — HEPARIN LEVEL (UNFRACTIONATED): HEPARIN UNFRACTIONATED: 0.62 [IU]/mL (ref 0.30–0.70)

## 2014-02-18 MED ORDER — AMIODARONE HCL IN DEXTROSE 360-4.14 MG/200ML-% IV SOLN
30.0000 mg/h | INTRAVENOUS | Status: DC
  Administered 2014-02-18 – 2014-02-20 (×3): 30 mg/h via INTRAVENOUS
  Filled 2014-02-18 (×8): qty 200

## 2014-02-18 MED ORDER — AMIODARONE HCL IN DEXTROSE 360-4.14 MG/200ML-% IV SOLN
60.0000 mg/h | INTRAVENOUS | Status: AC
  Administered 2014-02-18 (×2): 60 mg/h via INTRAVENOUS
  Filled 2014-02-18 (×2): qty 200

## 2014-02-18 MED ORDER — MAGNESIUM SULFATE 2 GM/50ML IV SOLN
2.0000 g | Freq: Once | INTRAVENOUS | Status: AC
  Administered 2014-02-18: 2 g via INTRAVENOUS
  Filled 2014-02-18: qty 50

## 2014-02-18 MED ORDER — WARFARIN - PHARMACIST DOSING INPATIENT
Freq: Every day | Status: DC
  Administered 2014-02-18: 11.125
  Administered 2014-02-19: 18:00:00

## 2014-02-18 MED ORDER — AMIODARONE LOAD VIA INFUSION
150.0000 mg | Freq: Once | INTRAVENOUS | Status: AC
  Administered 2014-02-18: 150 mg via INTRAVENOUS
  Filled 2014-02-18: qty 83.34

## 2014-02-18 MED ORDER — FUROSEMIDE 10 MG/ML IJ SOLN
80.0000 mg | Freq: Two times a day (BID) | INTRAMUSCULAR | Status: DC
  Administered 2014-02-18 – 2014-02-20 (×5): 80 mg via INTRAVENOUS
  Filled 2014-02-18 (×9): qty 8

## 2014-02-18 MED ORDER — WARFARIN SODIUM 10 MG PO TABS
11.2500 mg | ORAL_TABLET | Freq: Once | ORAL | Status: AC
  Administered 2014-02-18: 11.25 mg via ORAL
  Filled 2014-02-18: qty 1

## 2014-02-18 MED ORDER — FUROSEMIDE 10 MG/ML IJ SOLN
80.0000 mg | Freq: Once | INTRAMUSCULAR | Status: AC
  Administered 2014-02-18: 80 mg via INTRAVENOUS
  Filled 2014-02-18: qty 8

## 2014-02-18 NOTE — Progress Notes (Signed)
Results for MYCAL, SISTARE (MRN [id number]) as of 02/18/2014 04:57  Ref. Range 02/18/2014 04:25  Total hemoglobin Latest Range: 13.5-18.0 g/dL 9.5 (L)  Carboxyhemoglobin Latest Range: 0.5-1.5 % 1.5  Methemoglobin Latest Range: 0.0-1.5 % 0.8  O2 Saturation Latest Units: % 70.0  These results are not this patients results. Entered in error.

## 2014-02-18 NOTE — Progress Notes (Signed)
ANTICOAGULATION CONSULT NOTE - Follow Up Consult  Pharmacy Consult for Heparin and Coumadin Indication: atrial fibrillation  Allergies  Allergen Reactions  . Valsartan Cough    Patient Measurements: Height: 5\' 9"  (175.3 cm) Weight: 283 lb 3.2 oz (128.459 kg) IBW/kg (Calculated) : 70.7 Heparin Dosing Weight: ~104kg  Vital Signs: Temp: 96.6 F (35.9 C) (02/01 0600) Temp Source: Core (Comment) (02/01 0800) BP: 106/78 mmHg (02/01 0600) Pulse Rate: 108 (02/01 0009)  Labs:  Recent Labs  02/16/14 0600 02/16/14 1611 02/16/14 2100 02/17/14 0505 02/18/14 0514 02/18/14 0518  HGB 13.8  --   --  13.7  --  13.9  HCT 41.8  --   --  40.7  --  41.2  PLT 207  --   --  202  --  188  LABPROT 16.8*  --   --  16.3* 16.8*  --   INR 1.35  --   --  1.29 1.35  --   HEPARINUNFRC 0.15* 0.27* 0.72* 0.54 0.62  --   CREATININE 1.92* 2.18*  --  2.54*  --  2.07*    Estimated Creatinine Clearance: 56 mL/min (by C-G formula based on Cr of 2.07).   Medications:  Heparin @ 1600 units/hr  Assessment: 53yom on coumadin pta for afib, hx CVA. INR on admission was subtherapeutic so heparin bridge added. Heparin level is therapeutic. INR trend:  1/28, INR 1.2, 15mg  x 1 1/29, INR 1.31, dose missed 1/30, INR 1.35, 15mg  x 1 1/31, INR 1.29, Dr. Gala Romney held dose anticipating possible IABP  Today's INR remains below goal and essentially unchanged from the past several days. Co-ox is good and at this point Dr. Gala Romney does not feel he will need an IABP so coumadin to resume. He is also starting IV amiodarone so don't want to be too aggressive with dosing as INR likely to jump. CBC stable. No bleeding reported.  Home dose: 7.5mg  daily except 11.25mg  on Mon/Thurs/Sat  Goal of Therapy:  INR 2-3  Heparin level 0.3-0.7 Monitor platelets by anticoagulation protocol: Yes   Plan:  1) Continue heparin at 1600 units/hr 2) Coumadin 11.25mg  x 1 3) Follow up heparin level, INR, CBC in AM  Fredrik Rigger 02/18/2014,9:50 AM

## 2014-02-18 NOTE — Progress Notes (Signed)
Patient has been in bed all day, self regulates and turns freely in bed, he at times will sit up on edge of bed independently, ambulated 1x to [facility] with minimal assistance and cane from home.  He is alert and oriented, appears to be forgetful of short term memory.  Spoke with his mother and updated care status and also spoke to his girlfriend. He has been free of pain or discomfort, nausea and vomiting, has complained of cough which is dry.  Will continue to monitor.

## 2014-02-18 NOTE — Progress Notes (Signed)
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   LHC in 2008 with normal cors. Myoview 05/2010: Very mild distal anterior and apical ischemia, EF 17% (low risk-medical therapy continued). Previously on Amiodarone and had very high TSH and was referred to endocrinology. He has had several admissions over the last 6 mos with recurrent atrial fibrillation with RVR and volume overload. He has undergone TEE-DCCV but had return of AFib. He was last admitted 12/21-12/24 with a/c systolic CHF in the setting of AFib with RVR and loss of BiV pacing. AV nodal ablation was recommended for control of refractory atrial fibrillation. He underwent this procedure with Dr. [name] 01/09/13. Amiodarone was discontinued at that time. Tikosyn considered but couldn't afford.  Readmitted on 1/28 for recurrent HF. NYHA IV. At baseline can't walk to bathroom without dyspnea.  RHC on 1/29 showed high filling pressures (PCWP 30) and low output (CI 1.4). Started on milrinone and IV lasix continued. Post cath had ? Brief seizure episdoe on table felt to be due to hypoxia/coughing. Now resolved.   Yesterday milrinone was increased to 0.375 mcg and losartan stopped. Weight up 1 pound. Creatinine trending down 2.5>2.0.   SOB at rest.   Interrogated ICD personally. PVC burden about 1% but biV pacing only 75% due to rapid AF. Volume status way up.   Swan numbers done personally CVP 12 PA  53/30 (38) PCWP  39     SVR    1697 Thermo CO/CI  3.4/1.4 Co-ox 71%   Objective:   Weight Range:  Vital Signs:   Temp:  [95.9 F (35.5 C)-97.7 F (36.5 C)] 95.9 F (35.5 C) (02/01 0400) Pulse Rate:  [69-131] 108 (02/01 0009) Resp:  [11-28] 16 (02/01 0400) BP: (80-127)/(29-70) 84/47 mmHg  (02/01 0400) SpO2:  [89 %-100 %] 95 % (02/01 0400) Weight:  [283 lb 3.2 oz (128.459 kg)] 283 lb 3.2 oz (128.459 kg) (02/01 0500) Last BM Date: 02/17/14  Weight change: Filed Weights   02/16/14 0500 02/17/14 0257 02/18/14 0500  Weight: 277 lb 12.5 oz (126 kg) 282 lb 6.4 oz (128.096 kg) 283 lb 3.2 oz (128.459 kg)    Intake/Output:   Intake/Output Summary (Last 24 hours) at 02/18/14 0738 Last data filed at 02/18/14 0600  Gross per 24 hour  Intake 2605.48 ml  Output   3325 ml  Net -719.52 ml     Physical Exam:  General: Lying in bed. Dyspneic talking.  HEENT: normal Neck: supple.RIJ swan Carotids 2+ bilaterally; no bruits. No lymphadenopathy or thryomegaly appreciated. Cor: PMI normal. Regular rate & rhythm. +s3 Lungs: clear Abdomen: Obese; soft, nontender, non distended. No hepatosplenomegaly. No bruits or masses. Good bowel sounds. Extremities: no cyanosis, clubbing, rash, 1+ edema Neuro: alert & orientedx3, cranial nerves grossly intact. LUE weak  Telemetry: AF with v pacing with frequent PVCs  Labs: Basic Metabolic Panel:  Recent Labs Lab 02/15/14 0500 02/16/14 0600 02/16/14 1611 02/17/14 0505 02/18/14 0518  NA 137 131* 133* 129* 131*  K 3.4* 2.9* 3.4* 3.9 4.1  CL 102 91* 92* 92* 96  CO2 22 31 37* 27 28  GLUCOSE 101* 129* 119* 145* 133*  BUN 26* 24*  CREATININE 1.89* 1.92* 2.18* 2.54* 2.07*  CALCIUM 9.0  8.8 9.1 8.7 8.9    Liver Function Tests: No results for input(s): AST, ALT, ALKPHOS, BILITOT, PROT, ALBUMIN in the last 168 hours. No results for input(s): LIPASE, AMYLASE in the last 168 hours. No results for input(s): AMMONIA in the last 168 hours.  CBC:  Recent Labs Lab 02/14/14 1524 02/15/14 0500 02/16/14 0600 02/17/14 0505 02/18/14 0518  WBC 13.0* 10.0 8.4 8.4 8.2  HGB 14.2 14.2 13.8 13.7 13.9  HCT 43.7 43.5 41.8 40.7 41.2  MCV 93.8 93.8 92.1 91.1 90.9  PLT 237 225 207 202 188    Cardiac Enzymes: No results for input(s):  CKTOTAL, CKMB, CKMBINDEX, TROPONINI in the last 168 hours.  BNP: BNP (last 3 results)  Recent Labs  03/14/13 1412 04/02/13 1417 12/26/13 1205  PROBNP 3595.0* 2869.0* 4324.0*     Other results:  :   Imaging: No results found.   Medications:     Scheduled Medications: . cyclobenzaprine  10 mg Oral BID  . levothyroxine  250 mcg Oral QAC breakfast  . potassium chloride  20 mEq Oral BID  . sodium chloride  3 mL Intravenous Q12H  . sodium chloride  3 mL Intravenous Q12H  . spironolactone  25 mg Oral Daily    Infusions: . heparin 1,600 Units/hr (02/17/14 1955)  . milrinone 0.375 mcg/kg/min (02/18/14 0522)    PRN Medications: sodium chloride, sodium chloride, acetaminophen, acetaminophen, ALPRAZolam, dextromethorphan, HYDROcodone-acetaminophen, ondansetron (ZOFRAN) IV, sodium chloride, sodium chloride   Assessment:   1. A/c systolic HF 2. NICM EF 10% 3. Chronic AF s/p AVN ablation and CRT-D 4. Acute on chronic renal failure stage IV 5. H/o CVA 6. Morbid obesity 7. Hypokalemia/hyponatremia 8 Cardiogenic shock   Plan/Discussion:    Remains on milrinone 0.375 mcg. CO-OX 70%. Can add levophed as needed. CVP 18 . Give 80 mg IV lasix now. Dr Gala Romney to obtain swan numbers this am.   Unfortunately he is not candidate for advanced therapies due to lack of social support, poor compliance and CKD. Will require home inotropes once stabilized. When swan comes out will place tunneled PICC in RIJ.   He is having extensive PVCs. May compromise cardiac output.   Ask ST jude to interrogate his device.Chronic A fib with 1 % PVC burden.  Add  Amiodarone today. Hold coumadin for now. Check magnesium.    Length of Stay: 4 CLEGG,AMY NP-C  02/18/2014, 7:38 AM  Advanced Heart Failure Team Pager [phone] (M-F; 7a - 4p)  Please contact [facility] for night-coverage after hours (4p -7a ) and weekends on [email or web address]  Swan numbers and ICD interrogation done personally at  bedside.   His volume status is much worse. Co-ox improved with higher dose of milrinone but thermo numbers still low. Renal function improving so I suspect cardiac output is adequate. Will continue milrinone as 0.375. Can add levophed as needed. Diurese aggressively. Will need home inotropes once stabilized. Will start amiodarone to suppress PVCs and slow down AF so as to encourage a higher percentage of BiV pacing. Can restart coumadin.   The patient is critically ill with multiple organ systems failure and requires high complexity decision making for assessment and support, frequent evaluation and titration of therapies, application of advanced monitoring technologies and extensive interpretation of multiple databases.   Critical Care Time devoted to patient care services described in this note is 45 Minutes.  Daniel Bensimhon,MD 11:12 AM

## 2014-02-18 NOTE — Progress Notes (Signed)
While doing bedside shift report, found a 20 oz soft drink in patients bed wrapped in towels, have explained to him multiple times through out the day that we were monitoring fluid intake. He stated we don't know what it is like, he is peeing so much that he is dehydrated.  Explained to him that he could have a piece of candy to moisten his mouth, family member was at bedside for the discussion but apparently his girlfriend brought it to him anyways before leaving for the night.

## 2014-02-19 LAB — BASIC METABOLIC PANEL
Anion gap: 8 (ref 5–15)
BUN: 17 mg/dL (ref 6–23)
CALCIUM: 8.8 mg/dL (ref 8.4–10.5)
CO2: 30 mmol/L (ref 19–32)
Chloride: 95 mmol/L — ABNORMAL LOW (ref 96–112)
Creatinine, Ser: 1.74 mg/dL — ABNORMAL HIGH (ref 0.50–1.35)
GFR calc Af Amer: 51 mL/min — ABNORMAL LOW (ref >90)
GFR calc non Af Amer: 44 mL/min — ABNORMAL LOW (ref >90)
Glucose, Bld: 164 mg/dL — ABNORMAL HIGH (ref 70–99)
Potassium: 4.3 mmol/L (ref 3.5–5.1)
Sodium: 133 mmol/L — ABNORMAL LOW (ref 135–145)

## 2014-02-19 LAB — CBC
HEMATOCRIT: 44.8 % (ref 39.0–52.0)
Hemoglobin: 14.8 g/dL (ref 13.0–17.0)
MCH: 30.9 pg (ref 26.0–34.0)
MCHC: 33 g/dL (ref 30.0–36.0)
MCV: 93.5 fL (ref 78.0–100.0)
Platelets: 193 10*3/uL (ref 150–400)
RBC: 4.79 MIL/uL (ref 4.22–5.81)
RDW: 14.7 % (ref 11.5–15.5)
WBC: 9.1 10*3/uL (ref 4.0–10.5)

## 2014-02-19 LAB — HEPARIN LEVEL (UNFRACTIONATED)
HEPARIN UNFRACTIONATED: 0.7 [IU]/mL (ref 0.30–0.70)
Heparin Unfractionated: 0.85 IU/mL — ABNORMAL HIGH (ref 0.30–0.70)
Heparin Unfractionated: 0.84 IU/mL — ABNORMAL HIGH (ref 0.30–0.70)

## 2014-02-19 LAB — CARBOXYHEMOGLOBIN
Carboxyhemoglobin: 1 % (ref 0.5–1.5)
Methemoglobin: 1 % (ref 0.0–1.5)
O2 SAT: 65 %
Total hemoglobin: 14.9 g/dL (ref 13.5–18.0)

## 2014-02-19 LAB — PROTIME-INR
INR: 1.34 (ref 0.00–1.49)
PROTHROMBIN TIME: 16.7 s — AB (ref 11.6–15.2)

## 2014-02-19 LAB — MAGNESIUM: MAGNESIUM: 1.9 mg/dL (ref 1.5–2.5)

## 2014-02-19 MED ORDER — HEPARIN (PORCINE) IN NACL 100-0.45 UNIT/ML-% IJ SOLN
1150.0000 [IU]/h | INTRAMUSCULAR | Status: DC
  Administered 2014-02-19 (×2): 1300 [IU]/h via INTRAVENOUS
  Administered 2014-02-20: 1150 [IU]/h via INTRAVENOUS
  Filled 2014-02-19 (×4): qty 250

## 2014-02-19 MED ORDER — MAGNESIUM SULFATE 2 GM/50ML IV SOLN
2.0000 g | Freq: Once | INTRAVENOUS | Status: AC
  Administered 2014-02-19: 2 g via INTRAVENOUS
  Filled 2014-02-19: qty 50

## 2014-02-19 MED ORDER — WARFARIN SODIUM 10 MG PO TABS
11.2500 mg | ORAL_TABLET | Freq: Once | ORAL | Status: AC
  Administered 2014-02-19: 11.25 mg via ORAL
  Filled 2014-02-19: qty 1

## 2014-02-19 NOTE — Progress Notes (Signed)
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   LHC in 2008 with normal cors. Myoview 05/2010: Very mild distal anterior and apical ischemia, EF 17% (low risk-medical therapy continued). Previously on Amiodarone and had very high TSH and was referred to endocrinology. He has had several admissions over the last 6 mos with recurrent atrial fibrillation with RVR and volume overload. He has undergone TEE-DCCV but had return of AFib. He was last admitted 12/21-12/24 with a/c systolic CHF in the setting of AFib with RVR and loss of BiV pacing. AV nodal ablation was recommended for control of refractory atrial fibrillation. He underwent this procedure with Dr. [name] 01/09/13. Amiodarone was discontinued at that time. Tikosyn considered but couldn't afford.  Readmitted on 1/28 for recurrent HF. NYHA IV. At baseline can't walk to bathroom without dyspnea.  RHC on 1/29 showed high filling pressures (PCWP 30) and low output (CI 1.4). Started on milrinone and IV lasix continued. Post cath had ? Brief seizure episdoe on table felt to be due to hypoxia/coughing. Now resolved.   Interrogated ICD personally. PVC burden about 1% but biV pacing only 75% due to rapid AF. Started on amio.   Continues on milrinone  0.375 mcg + lasix. Increased urine output noted. Creatinine down from 2.0>1.7    Overall feeling better. Denies dyspnea at rest.   Swan numbers  CVP 9 PA  65/27 (42) PCWP  30   SVR    1771 Thermo CO/CI 4.6/1.9   CO-OX 65 %  Objective:   Weight Range:  Vital Signs:   Temp:  [96.6 F (35.9 C)-98.1 F (36.7 C)] 96.6 F (35.9 C) (02/02 0600) Pulse Rate:  [48-84] 74 (02/02 0600) Resp:  [13-31] 24 (02/02 0600) BP: (89-133)/(52-91) 116/79 mmHg (02/02  0600) SpO2:  [87 %-100 %] 95 % (02/02 0600) Last BM Date: 02/17/14  Weight change: Filed Weights   02/16/14 0500 02/17/14 0257 02/18/14 0500  Weight: 277 lb 12.5 oz (126 kg) 282 lb 6.4 oz (128.096 kg) 283 lb 3.2 oz (128.459 kg)    Intake/Output:   Intake/Output Summary (Last 24 hours) at 02/19/14 0704 Last data filed at 02/19/14 0600  Gross per 24 hour  Intake 2623.39 ml  Output   4750 ml  Net -2126.61 ml     Physical Exam:  General: Lying in bed.  HEENT: normal Neck: supple.RIJ swan Carotids 2+ bilaterally; no bruits. No lymphadenopathy or thryomegaly appreciated. Cor: PMI normal. Regular rate & rhythm. +s3 Lungs: clear Abdomen: Obese; soft, nontender, non distended. No hepatosplenomegaly. No bruits or masses. Good bowel sounds. Extremities: no cyanosis, clubbing, rash, 1+ edema Neuro: alert & orientedx3, cranial nerves grossly intact. LUE weak  Telemetry: AF with v pacing with frequent PVCs  Labs: Basic Metabolic Panel:  Recent Labs Lab 02/16/14 0600 02/16/14 1611 02/17/14 0505 02/18/14 0518 02/19/14 0500  NA 131* 133* 129* 131* 133*  K 2.9* 3.4* 3.9 4.1 4.3  CL 91* 92* 92* 96 95*  CO2 31 37* GLUCOSE 129* 119* 145* 133* 164*  BUN 20 20 26* 24* 17  CREATININE 1.92* 2.18* 2.54* 2.07* 1.74*  CALCIUM 8.8 9.1 8.7 8.9 8.8  MG  --   --   --  1.7 1.9  Liver Function Tests: No results for input(s): AST, ALT, ALKPHOS, BILITOT, PROT, ALBUMIN in the last 168 hours. No results for input(s): LIPASE, AMYLASE in the last 168 hours. No results for input(s): AMMONIA in the last 168 hours.  CBC:  Recent Labs Lab 02/15/14 0500 02/16/14 0600 02/17/14 0505 02/18/14 0518 02/19/14 0500  WBC 10.0 8.4 8.4 8.2 9.1  HGB 14.2 13.8 13.7 13.9 14.8  HCT 43.5 41.8 40.7 41.2 44.8  MCV 93.8 92.1 91.1 90.9 93.5  PLT 225 207 202 188 193    Cardiac Enzymes: No results for input(s): CKTOTAL, CKMB, CKMBINDEX, TROPONINI in the last 168 hours.  BNP: BNP (last 3  results)  Recent Labs  03/14/13 1412 04/02/13 1417 12/26/13 1205  PROBNP 3595.0* 2869.0* 4324.0*     Other results:  :   Imaging: Dg Chest Port 1 View  02/18/2014   CLINICAL DATA:  Repositioning of Swan-Ganz catheter.  EXAM: PORTABLE CHEST - 1 VIEW  COMPARISON:  Single view of the chest 02/16/2014.  FINDINGS: Right IJ approach Swan-Ganz catheter is again identified. The catheter appears looped in the pulmonary outflow tract with the tip projecting retrograde over the right heart. The lungs are clear. There is cardiomegaly. No pneumothorax or pleural effusion.  IMPRESSION: Swan-Ganz catheter appears to be looped in the pulmonary outflow tract with the tip projecting over the right ventricle. Exact position could be determined with a lateral film.  Cardiomegaly.   Electronically Signed   By: Drusilla Kanner M.D.   On: 02/18/2014 11:09     Medications:     Scheduled Medications: . cyclobenzaprine  10 mg Oral BID  . furosemide  80 mg Intravenous BID  . levothyroxine  250 mcg Oral QAC breakfast  . potassium chloride  20 mEq Oral BID  . sodium chloride  3 mL Intravenous Q12H  . sodium chloride  3 mL Intravenous Q12H  . spironolactone  25 mg Oral Daily  . Warfarin - Pharmacist Dosing Inpatient   Does not apply q1800    Infusions: . amiodarone 30 mg/hr (02/19/14 0600)  . heparin 1,500 Units/hr (02/19/14 0631)  . milrinone 0.375 mcg/kg/min (02/19/14 0600)    PRN Medications: sodium chloride, sodium chloride, acetaminophen, acetaminophen, ALPRAZolam, dextromethorphan, HYDROcodone-acetaminophen, ondansetron (ZOFRAN) IV, sodium chloride, sodium chloride   Assessment:   1. A/c systolic HF 2. NICM EF 10% 3. Chronic AF s/p AVN ablation and CRT-D 4. Acute on chronic renal failure stage IV 5. H/o CVA 6. Morbid obesity 7. Hypokalemia/hyponatremia 8 Cardiogenic shock   Plan/Discussion:    Remains on milrinone 0.375 mcg. CO-OX 65%. Weight down 2 pounds. CVP 14 . Continue 80 mg  IV lasix twice a day. Renal function improving.   Unfortunately he is not candidate for advanced therapies due to lack of social support, poor compliance and CKD. Will require home inotropes once stabilized. When swan comes out will place tunneled PICC in RIJ. He is agreeable and understands he will need HH.    Chronic A fib with 1 % PVC burden.  On  Amiodarone today. Resume coumadin once RIJ PICC placed. Mag 1.9.  Give 2 grams now.    Length of Stay: 5 CLEGG,AMY NP-C  02/19/2014, 7:04 AM  Advanced Heart Failure Team Pager [phone] (M-F; 7a - 4p)  Please contact [facility] for night-coverage after hours (4p -7a ) and weekends on [email or web address]  Patient seen and examined with Tonye Becket, NP. We discussed all aspects of the encounter. I agree with the assessment and plan as stated  above.   Cardiac output improved on higher dose milrinone. Still markedly volume overloaded. Continue diuresis for now. Will need RIJ tunneled PICC for home inotropes (milrinone 0.375). Continue amiodarone.   The patient is critically ill with multiple organ systems failure and requires high complexity decision making for assessment and support, frequent evaluation and titration of therapies, application of advanced monitoring technologies and extensive interpretation of multiple databases.   Critical Care Time devoted to patient care services described in this note is 35 Minutes.    Hilary Pundt,MD 11:57 AM

## 2014-02-19 NOTE — Progress Notes (Signed)
ANTICOAGULATION CONSULT NOTE - Follow Up Consult  Pharmacy Consult for heparin Indication: atrial fibrillation  Labs:  Recent Labs  02/16/14 1611  02/17/14 0505 02/18/14 0514 02/18/14 0518 02/19/14 0500  HGB  --   < > 13.7  --  13.9 14.8  HCT  --   --  40.7  --  41.2 44.8  PLT  --   --  202  --  188 193  LABPROT  --   --  16.3* 16.8*  --  16.7*  INR  --   --  1.29 1.35  --  1.34  HEPARINUNFRC 0.27*  < > 0.54 0.62  --  0.85*  CREATININE 2.18*  --  2.54*  --  2.07*  --   < > = values in this interval not displayed.   Assessment: 54yo male now supratherapeutic on heparin after two levels at goal though had been trending up.  Goal of Therapy:  Heparin level 0.3-0.7 units/ml   Plan:  Will decrease heparin gtt slightly to 1500 units/hr and check level in 6-8hr.  Vernard Gambles, PharmD, BCPS  02/19/2014,6:35 AM

## 2014-02-19 NOTE — Progress Notes (Signed)
[facility]  Patient Status: New pt for [facility] this admission  [facility] is providing the following services:  HHRN and Home Inotrope Pharmacy team for home Milrinone.  [facility] hospital team will provide in hospital teaching in preparation for home Milrinone. We will follow and support HF team will transition to home when ordered.  If patient discharges after hours, please call [phone].   Sedalia Muta 02/19/2014, 1:17 PM

## 2014-02-19 NOTE — Progress Notes (Signed)
ANTICOAGULATION CONSULT NOTE - Follow Up Consult  Pharmacy Consult for Heparin Indication: atrial fibrillation  Allergies  Allergen Reactions  . Valsartan Cough    Patient Measurements: Height: 5\' 9"  (175.3 cm) Weight: 279 lb 8.7 oz (126.8 kg) IBW/kg (Calculated) : 70.7 Heparin Dosing Weight: ~99.8kg  Vital Signs: Temp: 97.2 F (36.2 C) (02/02 2000) Temp Source: Core (Comment) (02/02 2000) BP: 129/88 mmHg (02/02 2000)  Labs:  Recent Labs  02/17/14 0505 02/18/14 0514 02/18/14 0518 02/19/14 0500 02/19/14 1230 02/19/14 1950  HGB 13.7  --  13.9 14.8  --   --   HCT 40.7  --  41.2 44.8  --   --   PLT 202  --  188 193  --   --   LABPROT 16.3* 16.8*  --  16.7*  --   --   INR 1.29 1.35  --  1.34  --   --   HEPARINUNFRC 0.54 0.62  --  0.85* 0.84* 0.70  CREATININE 2.54*  --  2.07* 1.74*  --   --     Estimated Creatinine Clearance: 66.1 mL/min (by C-G formula based on Cr of 1.74).   Medications:  . amiodarone 30 mg/hr (02/19/14 1900)  . heparin 1,300 Units/hr (02/19/14 1944)  . milrinone 0.375 mcg/kg/min (02/19/14 1900)    Assessment: 53yom on Coumadin pta for afib, hx CVA. INR on admission was subtherapeutic so heparin bridge added. Heparin level is at the upper end of normal at 0.7 on 1300 units/hr. Will decrease rate to attempt to get more in the middle of the goal range.  Goal of Therapy:  Heparin level 0.3-0.7 units/ml Monitor platelets by anticoagulation protocol: Yes   Plan:  Decrease heparin drip to 1150 units/hr Heparin level in the morning  [facility], [place].D., BCPS Clinical Pharmacist Pager: [phone] 02/19/2014 8:44 PM

## 2014-02-19 NOTE — Progress Notes (Addendum)
ANTICOAGULATION CONSULT NOTE - Follow Up Consult  Pharmacy Consult for Heparin and Coumadin Indication: atrial fibrillation  Allergies  Allergen Reactions  . Valsartan Cough    Patient Measurements: Height: 5\' 9"  (175.3 cm) Weight: 279 lb 8.7 oz (126.8 kg) IBW/kg (Calculated) : 70.7 Heparin Dosing Weight: ~99.8kg  Vital Signs: Temp: 96.8 F (36 C) (02/02 1200) Temp Source: Core (Comment) (02/02 1200) BP: 147/81 mmHg (02/02 1200) Pulse Rate: 70 (02/02 0800)  Labs:  Recent Labs  02/17/14 0505 02/18/14 0514 02/18/14 0518 02/19/14 0500 02/19/14 1230  HGB 13.7  --  13.9 14.8  --   HCT 40.7  --  41.2 44.8  --   PLT 202  --  188 193  --   LABPROT 16.3* 16.8*  --  16.7*  --   INR 1.29 1.35  --  1.34  --   HEPARINUNFRC 0.54 0.62  --  0.85* 0.84*  CREATININE 2.54*  --  2.07* 1.74*  --     Estimated Creatinine Clearance: 66.1 mL/min (by C-G formula based on Cr of 1.74).   Medications:  Heparin @ 1500 units/hr  Assessment: 53yom on coumadin pta for afib, hx CVA. INR on admission was subtherapeutic so heparin bridge added. Heparin level is still above goal despite rate decrease earlier today. Coumadin was resumed yesterday after decision made to not place IABP.  INR remains below goal and essentially unchanged. He continues on IV amiodarone which was started yesterday - haven't seen the drug interaction with coumadin yet. CBC stable. No bleeding reported.  Home dose: 7.5mg  daily except 11.25mg  on Mon/Thurs/Sat  Goal of Therapy:  INR 2-3  Heparin level 0.3-0.7 Monitor platelets by anticoagulation protocol: Yes   Plan:  1) Decrease heparin to 1300 units/hr 2) Check heparin level in 6 hours 3) Repeat coumadin 11.25mg  x 1 4) Follow up INR, CBC in AM  Fredrik Rigger 02/19/2014,1:09 PM

## 2014-02-20 DIAGNOSIS — R0602 Shortness of breath: Secondary | ICD-10-CM

## 2014-02-20 DIAGNOSIS — G479 Sleep disorder, unspecified: Secondary | ICD-10-CM

## 2014-02-20 DIAGNOSIS — I481 Persistent atrial fibrillation: Secondary | ICD-10-CM

## 2014-02-20 DIAGNOSIS — Z515 Encounter for palliative care: Secondary | ICD-10-CM

## 2014-02-20 LAB — BASIC METABOLIC PANEL
Anion gap: 9 (ref 5–15)
BUN: 17 mg/dL (ref 6–23)
CALCIUM: 9 mg/dL (ref 8.4–10.5)
CO2: 29 mmol/L (ref 19–32)
CREATININE: 1.74 mg/dL — AB (ref 0.50–1.35)
Chloride: 92 mmol/L — ABNORMAL LOW (ref 96–112)
GFR, EST AFRICAN AMERICAN: 51 mL/min — AB (ref >90)
GFR, EST NON AFRICAN AMERICAN: 44 mL/min — AB (ref >90)
Glucose, Bld: 180 mg/dL — ABNORMAL HIGH (ref 70–99)
POTASSIUM: 4.3 mmol/L (ref 3.5–5.1)
SODIUM: 130 mmol/L — AB (ref 135–145)

## 2014-02-20 LAB — CARBOXYHEMOGLOBIN
Carboxyhemoglobin: 1.2 % (ref 0.5–1.5)
METHEMOGLOBIN: 1 % (ref 0.0–1.5)
O2 Saturation: 74.6 %
Total hemoglobin: 15.5 g/dL (ref 13.5–18.0)

## 2014-02-20 LAB — CBC
HCT: 47.5 % (ref 39.0–52.0)
Hemoglobin: 16.1 g/dL (ref 13.0–17.0)
MCH: 31.6 pg (ref 26.0–34.0)
MCHC: 33.9 g/dL (ref 30.0–36.0)
MCV: 93.3 fL (ref 78.0–100.0)
Platelets: 187 10*3/uL (ref 150–400)
RBC: 5.09 MIL/uL (ref 4.22–5.81)
RDW: 14.7 % (ref 11.5–15.5)
WBC: 10.2 10*3/uL (ref 4.0–10.5)

## 2014-02-20 LAB — PROTIME-INR
INR: 1.42 (ref 0.00–1.49)
PROTHROMBIN TIME: 17.5 s — AB (ref 11.6–15.2)

## 2014-02-20 LAB — HEPARIN LEVEL (UNFRACTIONATED): HEPARIN UNFRACTIONATED: 0.6 [IU]/mL (ref 0.30–0.70)

## 2014-02-20 MED ORDER — WARFARIN SODIUM 3 MG PO TABS
13.0000 mg | ORAL_TABLET | Freq: Once | ORAL | Status: AC
  Administered 2014-02-20: 13 mg via ORAL
  Filled 2014-02-20: qty 1

## 2014-02-20 MED ORDER — AMIODARONE HCL 200 MG PO TABS
200.0000 mg | ORAL_TABLET | Freq: Two times a day (BID) | ORAL | Status: DC
  Administered 2014-02-20 – 2014-02-22 (×5): 200 mg via ORAL
  Filled 2014-02-20 (×6): qty 1

## 2014-02-20 NOTE — Progress Notes (Signed)
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   LHC in 2008 with normal cors. Myoview 05/2010: Very mild distal anterior and apical ischemia, EF 17% (low risk-medical therapy continued). Previously on Amiodarone and had very high TSH and was referred to endocrinology. He has had several admissions over the last 6 mos with recurrent atrial fibrillation with RVR and volume overload. He has undergone TEE-DCCV but had return of AFib. He was last admitted 12/21-12/24 with a/c systolic CHF in the setting of AFib with RVR and loss of BiV pacing. AV nodal ablation was recommended for control of refractory atrial fibrillation. He underwent this procedure with Dr. [name] 01/09/13. Amiodarone was discontinued at that time. Tikosyn considered but couldn't afford.  Readmitted on 1/28 for recurrent HF. NYHA IV. At baseline can't walk to bathroom without dyspnea.  RHC on 1/29 showed high filling pressures (PCWP 30) and low output (CI 1.4). Started on milrinone and IV lasix continued. Post cath had ? Brief seizure episdoe on table felt to be due to hypoxia/coughing. Now resolved.   Interrogated ICD personally. PVC burden about 1% but biV pacing only 75% due to rapid AF. Started on amio.   Continues on milrinone  0.375 mcg + lasix 80 bid. Diuresing well. Down 5 pounds. Creatinine stable at 1.7  . Co-ox 74% Overall feeling better. Denies dyspnea at rest.   Swan numbers  CVP 6 PA  55/41 PCWP  Unable to wedge SVR     Thermo CO/CI 4.8/2.0   CO-OX 75%  Objective:   Weight Range:  Vital Signs:   Temp:  [96.3 F (35.7 C)-97.7 F (36.5 C)] 97.2 F (36.2 C) (02/03 0610) Pulse Rate:  [25-78] 78 (02/03 0000) Resp:  [14-25] 16 (02/03 0100) BP: (98-147)/(53-89) 140/77 mmHg  (02/03 0800) SpO2:  [91 %-100 %] 97 % (02/03 0800) Weight:  [124.6 kg (274 lb 11.1 oz)] 124.6 kg (274 lb 11.1 oz) (02/03 0500) Last BM Date: 02/17/14  Weight change: Filed Weights   02/18/14 0500 02/19/14 0700 02/20/14 0500  Weight: 128.459 kg (283 lb 3.2 oz) 126.8 kg (279 lb 8.7 oz) 124.6 kg (274 lb 11.1 oz)    Intake/Output:   Intake/Output Summary (Last 24 hours) at 02/20/14 1139 Last data filed at 02/20/14 1000  Gross per 24 hour  Intake 1223.13 ml  Output   2820 ml  Net -1596.87 ml     Physical Exam:  General: Lying in bed.  HEENT: normal Neck: supple.RIJ swan Carotids 2+ bilaterally; no bruits. No lymphadenopathy or thryomegaly appreciated. Cor: PMI normal. Regular rate & rhythm. +s3 Lungs: clear Abdomen: Obese; soft, nontender, non distended. No hepatosplenomegaly. No bruits or masses. Good bowel sounds. Extremities: no cyanosis, clubbing, rash, tr edema Neuro: alert & orientedx3, cranial nerves grossly intact. LUE weak  Telemetry: AF with v pacing with frequent PVCs  Labs: Basic Metabolic Panel:  Recent Labs Lab 02/16/14 1611 02/17/14 0505 02/18/14 0518 02/19/14 0500 02/20/14 0130  NA 133* 129* 131* 133* 130*  K 3.4* 3.9 4.1 4.3 4.3  CL 92* 92* 96 95* 92*  CO2 37* 27 28 30 29   GLUCOSE 119* 145* 133* 164* 180*  BUN 20 26* 24* 17 17  CREATININE 2.18* 2.54* 2.07* 1.74* 1.74*  CALCIUM 9.1  8.7 8.9 8.8 9.0  MG  --   --  1.7 1.9  --     Liver Function Tests: No results for input(s): AST, ALT, ALKPHOS, BILITOT, PROT, ALBUMIN in the last 168 hours. No results for input(s): LIPASE, AMYLASE in the last 168 hours. No results for input(s): AMMONIA in the last 168 hours.  CBC:  Recent Labs Lab 02/16/14 0600 02/17/14 0505 02/18/14 0518 02/19/14 0500 02/20/14 0130  WBC 8.4 8.4 8.2 9.1 10.2  HGB 13.8 13.7 13.9 14.8 16.1  HCT 41.8 40.7 41.2 44.8 47.5  MCV 92.1 91.1 90.9 93.5 93.3  PLT 207 202 188 193 187    Cardiac Enzymes: No results for input(s):  CKTOTAL, CKMB, CKMBINDEX, TROPONINI in the last 168 hours.  BNP: BNP (last 3 results)  Recent Labs  03/14/13 1412 04/02/13 1417 12/26/13 1205  PROBNP 3595.0* 2869.0* 4324.0*     Other results:  :   Imaging: No results found.   Medications:     Scheduled Medications: . cyclobenzaprine  10 mg Oral BID  . furosemide  80 mg Intravenous BID  . levothyroxine  250 mcg Oral QAC breakfast  . sodium chloride  3 mL Intravenous Q12H  . sodium chloride  3 mL Intravenous Q12H  . spironolactone  25 mg Oral Daily  . warfarin  13 mg Oral ONCE-1800  . Warfarin - Pharmacist Dosing Inpatient   Does not apply q1800    Infusions: . amiodarone 30 mg/hr (02/20/14 0800)  . heparin 1,150 Units/hr (02/20/14 0800)  . milrinone 0.375 mcg/kg/min (02/20/14 0800)    PRN Medications: sodium chloride, sodium chloride, acetaminophen, acetaminophen, ALPRAZolam, dextromethorphan, HYDROcodone-acetaminophen, ondansetron (ZOFRAN) IV, sodium chloride, sodium chloride   Assessment:   1. A/c systolic HF 2. NICM EF 10% 3. Chronic AF s/p AVN ablation and CRT-D 4. Acute on chronic renal failure stage IV 5. H/o CVA 6. Morbid obesity 7. Hypokalemia/hyponatremia 8 Cardiogenic shock   Plan/Discussion:    Cardiac output improved on higher dose milrinone. Volume status much better.  Continue diuresis one more day. Pull swan and sheath. Will order tunneled RIJ PICC for tomorrow. Will need home inotropes (milrinone 0.375). Switch amiodarone to 200 bid. Consult Cardiac Rehab.   The patient is critically ill with multiple organ systems failure and requires high complexity decision making for assessment and support, frequent evaluation and titration of therapies, application of advanced monitoring technologies and extensive interpretation of multiple databases.   Critical Care Time devoted to patient care services described in this note is 35 Minutes.   Truman Hayward 11:39 AM Advanced Heart Failure  Team Pager [phone] (M-F; 7a - 4p)  Please contact [facility] for night-coverage after hours (4p -7a ) and weekends on [email or web address]

## 2014-02-20 NOTE — Consult Note (Signed)
Patient [id number] Raymond [name]      DOB: 1960/03/25      QVZ:[id number]     Consult Note from the Palliative Medicine Team at [facility] Requested by: Darrick Grinder, NP   PCP: Reginia Forts, MD Reason for Consultation: [place]    Phone Number:[phone]  Assessment of patients Current state: I met with Raymond [name] today and he tells me that he lives alone and has limited resources. He says "my mother is too old and my brother is too busy." He has little support but says he has a girlfriend that comes on Sat and Sun and his neighbors will help him with groceries during the week. However, many times resources are limited and he mainly will eat a can of ravioli (he cannot cook d/t his past CVA). We discussed sodium content and discussed potential for frozen vegetables that may be microwaved and salads (also discussed sodium in condiments), fresh fruits and vegetables. I offer nutrition consult to help with ideas and he agrees to this. He also says he has nothing to eat ~2 days/week on avg. He also tells me that he has trouble affording his medications so I will ask social work to help with any resources for him as well. He has poor quality of life but enjoys visits from friends/neighbors occasionally and enjoys watching [place]sm[place] on television. 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 He says he wishes he could get well enough to walk on the beach or boardwalk.   He also mentions to me understanding of his heart disease and trajectory but mentions heart transplant and LVAD - when asked if his providers have discussed these options he says no. I informed him that sometimes it is not as easy as just having a transplant or having a device but that he has to be considering a good candidate for these options. I explained that his team will discuss these options if he is considered to be a candidate for these  therapies. I have asked for help for resources and education for [name] and I will follow up to continue conversation regarding goals of care.    Goals of Care: 1.  Code Status: FULL    2. Scope of Treatment: Continue all available and offered interventions.    4. Disposition: Home when stable.    3. Symptom Management:   1. Sleep Disturbance: He says the environment is distracting. Decrease stimulation and limit nighttime interruptions. May consider low dose trazodone if needed.  2. Shortness of breath: Continue milrinone and medical management.   4. Psychosocial: Emotional support provided to patient.    Brief HPI: 54 yo admitted with worsening dyspnea on exertion r/t recurrent systolic heart failure (EF 10% echo 4/15) NYHA IV. Treating Afib and initiated milrinone along with diuresis. He is planned to return home with milrinone. Home functional status effected by h/o CVA and residual effects to left side. PMH reviewed below.    ROS: + shortness of breath, + sleep disturbance    PMH:  Past Medical History  Diagnosis Date  . Non-ischemic cardiomyopathy     a. echo 11/10: EF 15%;   b. cath 10/08: normal cors;  c. Myoview 5/12: Very mild distal ant and apical isch, EF 17% (low risk-medical Rx cont'd);  d.  Echo 4/14:  EF 15%;  e. 05/2009 s/p SJM BiV ICD;  f. 07/2012  TEE: EF 20-25%, diff HK mild MR, mod TR.   Marland Kitchen Atrial fibrillation     a. on coumadin;  b. 07/2012 s/p TEE/DCCV. c. Recurrence 08/2012 in setting of abnormal thyroid panel.; 12/2012 AVN ablation by Dr Lovena Le  . Hypertension   . Obesity   . Non-compliance   . Dyslexia     unable to read.  Marland Kitchen History of CVA (cerebrovascular accident) 11/2008    a. 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 RBBB (right bundle branch block)   . Cough syncope     a. During 08/2012 adm.  . Systolic CHF, chronic 08/25/5025  . Hypothyroidism 01/19/1996    s/p RAI therapy  . Stroke 01/19/2008  . Renal insufficiency       PSH: Past Surgical History  Procedure Laterality Date  . Bi-ventricular implantable cardioverter defibrillator  (crt-d)  06/11/09    Arthur UNIFY [id number] BIVENTRICULAR AICD SERIAL #[id number]  . Knee arthroscopy Left ~ 1995  . Cardiac catheterization      "several time" (09/01/2012)  . Tee without cardioversion N/A 06/23/2012    Procedure: TRANSESOPHAGEAL ECHOCARDIOGRAM (TEE);  Surgeon: Thayer Headings, MD;  Location: [facility] ENDOSCOPY;  Service: Cardiovascular;  Laterality: N/A;  TEE will be done at 0730   . Cardioversion N/A 07/27/2012    Procedure: CARDIOVERSION;  Surgeon: Thayer Headings, MD;  Location: [facility] ENDOSCOPY;  Service: Cardiovascular;  Laterality: N/A;  . Tee without cardioversion N/A 08/10/2012    Procedure: TRANSESOPHAGEAL ECHOCARDIOGRAM (TEE);  Surgeon: Josue Hector, MD;  Location: [facility] ENDOSCOPY;  Service: Cardiovascular;  Laterality: N/A;  . Cardioversion N/A 08/10/2012    Procedure: CARDIOVERSION;  Surgeon: Josue Hector, MD;  Location: [facility] ENDOSCOPY;  Service: Cardiovascular;  Laterality: N/A;  . Sp pta add intra cran  11/2008    a. 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 Ablation  01/09/2013    AVN ablation by Dr Lovena Le  . Av node ablation N/A 01/09/2013    Procedure: AV NODE ABLATION;  Surgeon: Evans Lance, MD;  Location: [facility] CATH LAB;  Service: Cardiovascular;  Laterality: N/A;  . Right heart catheterization N/A 02/15/2014    Procedure: RIGHT HEART CATH;  Surgeon: Jolaine Artist, MD;  Location: [facility] CATH LAB;  Service: Cardiovascular;  Laterality: N/A;   I have reviewed the FH and SH and  If appropriate update it with new information. Allergies  Allergen Reactions  . Valsartan Cough   Scheduled Meds: . amiodarone  200 mg Oral BID  . cyclobenzaprine  10 mg Oral BID  . furosemide  80 mg Intravenous BID  . levothyroxine  250 mcg Oral QAC breakfast  . sodium chloride  3 mL Intravenous Q12H  . sodium chloride  3 mL Intravenous  Q12H  . spironolactone  25 mg Oral Daily  . warfarin  13 mg Oral ONCE-1800  . Warfarin - Pharmacist Dosing Inpatient   Does not apply q1800   Continuous Infusions: . heparin 1,150 Units/hr (02/20/14 0800)  . milrinone 0.375 mcg/kg/min (02/20/14 1339)   PRN Meds:.sodium chloride, sodium chloride, acetaminophen, acetaminophen, ALPRAZolam, dextromethorphan, HYDROcodone-acetaminophen, ondansetron (ZOFRAN) IV, sodium chloride, sodium chloride    BP 135/87 mmHg  Pulse 78  Temp(Src) 97.2 F (36.2 C) (Core (Comment))  Resp 20  Ht 5' 9"  (1.753 m)  Wt 124.6 kg (274 lb 11.1 oz)  BMI 40.55 kg/m2  SpO2 98%   PPS: 40%   Intake/Output Summary (Last 24 hours)  at 02/20/14 1606 Last data filed at 02/20/14 1400  Gross per 24 hour  Intake 1770.53 ml  Output   3025 ml  Net -1254.47 ml    Physical Exam:  General: NAD, lying in bed HEENT: [place]/AT, +JVD, moist mucous membranes Chest: CTA throughout, no labored breathing at now CVS: RRR Abdomen: Soft, NT, ND, +BS Ext: MAE - weakened LUE (h/o CVA), trace BLE edema, warm to touch Neuro: Awake, alert, oriented x 3  Labs: CBC    Component Value Date/Time   WBC 10.2 02/20/2014 0130   WBC 7.5 04/28/2012 1200   RBC 5.09 02/20/2014 0130   RBC 4.49* 04/28/2012 1200   HGB 16.1 02/20/2014 0130   HGB 13.5* 04/28/2012 1200   HCT 47.5 02/20/2014 0130   HCT 43.6 04/28/2012 1200   PLT 187 02/20/2014 0130   MCV 93.3 02/20/2014 0130   MCV 97.0 04/28/2012 1200   MCH 31.6 02/20/2014 0130   MCH 30.1 04/28/2012 1200   MCHC 33.9 02/20/2014 0130   MCHC 31.0* 04/28/2012 1200   RDW 14.7 02/20/2014 0130   LYMPHSABS 1.4 08/27/2013 1350   MONOABS 0.9 08/27/2013 1350   EOSABS 0.2 08/27/2013 1350   BASOSABS 0.1 08/27/2013 1350    BMET    Component Value Date/Time   NA 130* 02/20/2014 0130   K 4.3 02/20/2014 0130   CL 92* 02/20/2014 0130   CO2 29 02/20/2014 0130   GLUCOSE 180* 02/20/2014 0130   BUN 17 02/20/2014 0130   CREATININE 1.74* 02/20/2014  0130   CREATININE 1.53* 08/27/2013 1350   CALCIUM 9.0 02/20/2014 0130   GFRNONAA 44* 02/20/2014 0130   GFRNONAA 52* 08/27/2013 1350   GFRAA 51* 02/20/2014 0130   GFRAA 60 08/27/2013 1350    CMP     Component Value Date/Time   NA 130* 02/20/2014 0130   K 4.3 02/20/2014 0130   CL 92* 02/20/2014 0130   CO2 29 02/20/2014 0130   GLUCOSE 180* 02/20/2014 0130   BUN 17 02/20/2014 0130   CREATININE 1.74* 02/20/2014 0130   CREATININE 1.53* 08/27/2013 1350   CALCIUM 9.0 02/20/2014 0130   PROT 6.8 08/27/2013 1350   ALBUMIN 4.2 08/27/2013 1350   AST 25 08/27/2013 1350   ALT 17 08/27/2013 1350   ALKPHOS 104 08/27/2013 1350   BILITOT 0.7 08/27/2013 1350   GFRNONAA 44* 02/20/2014 0130   GFRNONAA 52* 08/27/2013 1350   GFRAA 51* 02/20/2014 0130   GFRAA 60 08/27/2013 1350     Time In Time Out Total Time Spent with Patient Total Overall Time  1500 1610 80mn 738m    Greater than 50%  of this time was spent counseling and coordinating care related to the above assessment and plan.  AlVinie SillNP Palliative Medicine Team Pager # [phone]-F 8a-5p) Team Phone # [phone]/Weekends)

## 2014-02-20 NOTE — Progress Notes (Signed)
Dear Doctor:  This patient has been identified as a candidate for PICC for the following reason (s): IV therapy over 48 hours, drug extravasation potential with tissue necrosis (KCL, Dilantin, Dopamine, CaCl, MgSO4, chemo vesicant) and incompatible drugs (aminophyllin, TPN, heparin, given with an antibiotic) If you agree, please write an order for the indicated device. For any questions contact the Vascular Access Team at [phone] if no answer, please leave a message.  Thank you for supporting the early vascular access assessment program.

## 2014-02-20 NOTE — Progress Notes (Signed)
Patient repeatedly removed BP cuff and pulse-ox monitor even after education of need for continuous monitoring of vital signs. Patients vital signs stable HR 70, BP 138/85, RR 20, O2 100%. Will continue to monitor BP and O2 intermittently as patient allows.

## 2014-02-20 NOTE — Progress Notes (Signed)
ANTICOAGULATION CONSULT NOTE - Follow Up Consult  Pharmacy Consult for Heparin and Coumadin Indication: atrial fibrillation  Allergies  Allergen Reactions  . Valsartan Cough    Patient Measurements: Height: 5\' 9"  (175.3 cm) Weight: 274 lb 11.1 oz (124.6 kg) IBW/kg (Calculated) : 70.7 Heparin Dosing Weight: ~99.8kg  Vital Signs: Temp: 97.2 F (36.2 C) (02/03 0610) Temp Source: Core (Comment) (02/03 0800) BP: 140/77 mmHg (02/03 0800) Pulse Rate: 78 (02/03 0000)  Labs:  Recent Labs  02/18/14 0514  02/18/14 0518 02/19/14 0500 02/19/14 1230 02/19/14 1950 02/20/14 0130 02/20/14 0445  HGB  --   < > 13.9 14.8  --   --  16.1  --   HCT  --   --  41.2 44.8  --   --  47.5  --   PLT  --   --  188 193  --   --  187  --   LABPROT 16.8*  --   --  16.7*  --   --   --  17.5*  INR 1.35  --   --  1.34  --   --   --  1.42  HEPARINUNFRC 0.62  --   --  0.85* 0.84* 0.70  --  0.60  CREATININE  --   --  2.07* 1.74*  --   --  1.74*  --   < > = values in this interval not displayed.  Estimated Creatinine Clearance: 65.6 mL/min (by C-G formula based on Cr of 1.74).   Medications:  Heparin @ 1500 units/hr  Assessment: 53yom on coumadin pta for afib, hx CVA. INR on admission was subtherapeutic so heparin bridge added. Heparin level is therapeutic. Coumadin was resumed 2/1 after decision made to not place IABP.  INR remains below goal and essentially unchanged. Have been dosing conservatively since he was started on IV amiodarone, but have not seen a drug interaction yet so will increase coumadin dose today. CBC stable. No bleeding reported.  Home dose: 7.5mg  daily except 11.25mg  on Mon/Thurs/Sat  Goal of Therapy:  INR 2-3  Heparin level 0.3-0.7 Monitor platelets by anticoagulation protocol: Yes   Plan:  1) Continue heparin at 1150 units/hr 2) Increase coumadin to 13mg  x 1 3) Follow up heparin level, INR, CBC in AM  Fredrik Rigger 02/20/2014,10:43 AM

## 2014-02-20 NOTE — Progress Notes (Signed)
CARDIAC REHAB PHASE I   PRE:  Rate/Rhythm: 73 paced    BP: sitting 134/84    SaO2: 97 RA  MODE:  Ambulation: 270 ft   POST:  Rate/Rhythm: 79 pacing    BP: sitting 152/89     SaO2: 98 RA  Pt willing to walk. Unsteady upon standing. Used his cane in right hand. I used gait belt to assist. Pt with left side weakness therefore unsteady on feet. Rest x2. After walk pt sts this was the farthest he had walked in one year. To EOB to bathe. Set up recliner for later. Will f/u. [phone]   Elissa Lovett Zillah CES, ACSM 02/20/2014 3:01 PM

## 2014-02-21 ENCOUNTER — Inpatient Hospital Stay (HOSPITAL_COMMUNITY): Payer: Medicare Other

## 2014-02-21 LAB — BASIC METABOLIC PANEL
Anion gap: 12 (ref 5–15)
BUN: 19 mg/dL (ref 6–23)
CHLORIDE: 91 mmol/L — AB (ref 96–112)
CO2: 27 mmol/L (ref 19–32)
Calcium: 9.2 mg/dL (ref 8.4–10.5)
Creatinine, Ser: 1.84 mg/dL — ABNORMAL HIGH (ref 0.50–1.35)
GFR, EST AFRICAN AMERICAN: 47 mL/min — AB (ref >90)
GFR, EST NON AFRICAN AMERICAN: 41 mL/min — AB (ref >90)
Glucose, Bld: 144 mg/dL — ABNORMAL HIGH (ref 70–99)
Potassium: 4.1 mmol/L (ref 3.5–5.1)
Sodium: 130 mmol/L — ABNORMAL LOW (ref 135–145)

## 2014-02-21 LAB — CBC
HEMATOCRIT: 47.8 % (ref 39.0–52.0)
Hemoglobin: 16 g/dL (ref 13.0–17.0)
MCH: 30.3 pg (ref 26.0–34.0)
MCHC: 33.5 g/dL (ref 30.0–36.0)
MCV: 90.5 fL (ref 78.0–100.0)
PLATELETS: 196 10*3/uL (ref 150–400)
RBC: 5.28 MIL/uL (ref 4.22–5.81)
RDW: 14.5 % (ref 11.5–15.5)
WBC: 8.2 10*3/uL (ref 4.0–10.5)

## 2014-02-21 LAB — PROTIME-INR
INR: 1.57 — AB (ref 0.00–1.49)
Prothrombin Time: 18.9 seconds — ABNORMAL HIGH (ref 11.6–15.2)

## 2014-02-21 LAB — HEPARIN LEVEL (UNFRACTIONATED): Heparin Unfractionated: 0.46 IU/mL (ref 0.30–0.70)

## 2014-02-21 MED ORDER — WARFARIN SODIUM 7.5 MG PO TABS
15.0000 mg | ORAL_TABLET | Freq: Once | ORAL | Status: AC
  Administered 2014-02-21: 15 mg via ORAL
  Filled 2014-02-21: qty 2

## 2014-02-21 MED ORDER — LIDOCAINE HCL 1 % IJ SOLN
INTRAMUSCULAR | Status: AC
  Filled 2014-02-21: qty 20

## 2014-02-21 MED ORDER — HEPARIN (PORCINE) IN NACL 100-0.45 UNIT/ML-% IJ SOLN
1250.0000 [IU]/h | INTRAMUSCULAR | Status: DC
  Administered 2014-02-21: 1150 [IU]/h via INTRAVENOUS
  Filled 2014-02-21: qty 250

## 2014-02-21 MED ORDER — TORSEMIDE 20 MG PO TABS
60.0000 mg | ORAL_TABLET | Freq: Every day | ORAL | Status: DC
  Administered 2014-02-21 – 2014-02-22 (×2): 60 mg via ORAL
  Filled 2014-02-21 (×2): qty 3

## 2014-02-21 NOTE — Progress Notes (Signed)
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   LHC in 2008 with normal cors. Myoview 05/2010: Very mild distal anterior and apical ischemia, EF 17% (low risk-medical therapy continued). Previously on Amiodarone and had very high TSH and was referred to endocrinology. He has had several admissions over the last 6 mos with recurrent atrial fibrillation with RVR and volume overload. He has undergone TEE-DCCV but had return of AFib. He was last admitted 12/21-12/24 with a/c systolic CHF in the setting of AFib with RVR and loss of BiV pacing. AV nodal ablation was recommended for control of refractory atrial fibrillation. He underwent this procedure with Dr. [name] 01/09/13. Amiodarone was discontinued at that time. Tikosyn considered but couldn't afford.  Readmitted on 1/28 for recurrent HF. NYHA IV. At baseline can't walk to bathroom without dyspnea.  RHC on 1/29 showed high filling pressures (PCWP 30) and low output (CI 1.4). Started on milrinone and IV lasix continued. Post cath had ? Brief seizure episdoe on table felt to be due to hypoxia/coughing. Now resolved.   Interrogated ICD personally. PVC burden about 1% but biV pacing only 75% due to rapid AF. Started on amio.   Yesterday swan was removed and amio was changed to 200 mg twice a day. Continues on milrinone  0.375 mcg + lasix 80 bid.   Denies SOB/Orthopnea.    Objective:   Weight Range:  Vital Signs:   Temp:  [96.6 F (35.9 C)-98.4 F (36.9 C)] 98.1 F (36.7 C) (02/04 0000) Resp:  [16-25] 17 (02/04 0600) BP: (112-141)/(65-87) 112/71 mmHg (02/04 0400) SpO2:  [95 %-99 %] 95 % (02/04 0000) Weight:  [274 lb 4 oz (124.4 kg)] 274 lb 4 oz (124.4 kg) (02/04 0500) Last BM Date: 02/18/14  Weight  change: Filed Weights   02/19/14 0700 02/20/14 0500 02/21/14 0500  Weight: 279 lb 8.7 oz (126.8 kg) 274 lb 11.1 oz (124.6 kg) 274 lb 4 oz (124.4 kg)    Intake/Output:   Intake/Output Summary (Last 24 hours) at 02/21/14 0736 Last data filed at 02/21/14 0600  Gross per 24 hour  Intake 1957.6 ml  Output   2700 ml  Net -742.4 ml     Physical Exam:  General: Lying in bed.  HEENT: normal Neck: supple.CVP ~7  Carotids 2+ bilaterally; no bruits. No lymphadenopathy or thryomegaly appreciated. Cor: PMI normal. Regular rate & rhythm. +s3 Lungs: clear Abdomen: Obese; soft, nontender, non distended. No hepatosplenomegaly. No bruits or masses. Good bowel sounds. Extremities: no cyanosis, clubbing, rash, tr edema Neuro: alert & orientedx3, cranial nerves grossly intact. LUE weak  Telemetry: AF with v pacing with frequent PVCs  Labs: Basic Metabolic Panel:  Recent Labs Lab 02/17/14 0505 02/18/14 0518 02/19/14 0500 02/20/14 0130 02/21/14 0251  NA 129* 131* 133* 130* 130*  K 3.9 4.1 4.3 4.3 4.1  CL 92* 96 95* 92* 91*  CO2 GLUCOSE 145* 133* 164* 180* 144*  BUN 26* 24* CREATININE 2.54* 2.07* 1.74* 1.74* 1.84*  CALCIUM 8.7 8.9 8.8 9.0 9.2  MG  --  1.7 1.9  --   --     Liver Function Tests: No results for input(s): AST, ALT, ALKPHOS, BILITOT, PROT, ALBUMIN  in the last 168 hours. No results for input(s): LIPASE, AMYLASE in the last 168 hours. No results for input(s): AMMONIA in the last 168 hours.  CBC:  Recent Labs Lab 02/17/14 0505 02/18/14 0518 02/19/14 0500 02/20/14 0130 02/21/14 0251  WBC 8.4 8.2 9.1 10.2 8.2  HGB 13.7 13.9 14.8 16.1 16.0  HCT 40.7 41.2 44.8 47.5 47.8  MCV 91.1 90.9 93.5 93.3 90.5  PLT 202 188 193 187 196    Cardiac Enzymes: No results for input(s): CKTOTAL, CKMB, CKMBINDEX, TROPONINI in the last 168 hours.  BNP: BNP (last 3 results)  Recent Labs  03/14/13 1412 04/02/13 1417 12/26/13 1205  PROBNP 3595.0*  2869.0* 4324.0*     Other results:  :   Imaging: No results found.   Medications:     Scheduled Medications: . amiodarone  200 mg Oral BID  . cyclobenzaprine  10 mg Oral BID  . furosemide  80 mg Intravenous BID  . levothyroxine  250 mcg Oral QAC breakfast  . sodium chloride  3 mL Intravenous Q12H  . sodium chloride  3 mL Intravenous Q12H  . spironolactone  25 mg Oral Daily  . Warfarin - Pharmacist Dosing Inpatient   Does not apply q1800    Infusions: . heparin 1,150 Units/hr (02/20/14 2000)  . milrinone 0.375 mcg/kg/min (02/21/14 0330)    PRN Medications: sodium chloride, sodium chloride, acetaminophen, acetaminophen, ALPRAZolam, dextromethorphan, HYDROcodone-acetaminophen, ondansetron (ZOFRAN) IV, sodium chloride, sodium chloride   Assessment:   1. A/c systolic HF 2. NICM EF 10% 3. Chronic AF s/p AVN ablation and CRT-D 4. Acute on chronic renal failure stage IV 5. H/o CVA 6. Morbid obesity 7. Hypokalemia/hyponatremia 8 Cardiogenic shock   Plan/Discussion:    Will need home inotropes (milrinone 0.375). Volume status stable. Stop IV lasix and transition torsemide 60 mg daily. Place tunneled catheter today for home milrinone.   Continue amiodarone to 200 bid.   Transfer to stepdown.     CLEGG,AMY, NP-C  7:36 AM Advanced Heart Failure Team Pager [phone] (M-F; 7a - 4p)  Please contact [facility] for night-coverage after hours (4p -7a ) and weekends on [email or web address]   Patient seen and examined with Tonye Becket, NP. We discussed all aspects of the encounter. I agree with the assessment and plan as stated above.   Much improved with milrinone. Volume status better. Renal function relatively stable. For tunneled picc today. Change to po diuretics. Hopefully home tomorrow with home milrinone. Continue po amiodarone.   Jamice Carreno,MD 12:08 PM

## 2014-02-21 NOTE — Procedures (Signed)
Placement of right jugular tunneled catheter (Powerline).  Tip in lower SVC.  Catheter is ready for use.  No immediate complication.

## 2014-02-21 NOTE — Consult Note (Signed)
Chief Complaint: Chief Complaint  Patient presents with  . Shortness of Breath  Nonischemic cardiomyopathy  Referring Physician(s): Dr Teressa Lower  History of Present Illness: Raymond [name] is a 54 y.o. male   NICM Need for home milrinone Cr 1.84---not dialysis candidate per Dr Teressa Lower Request for tunneled central catheter placement I have seen and examined pt INR 1.57  Past Medical History  Diagnosis Date  . Non-ischemic cardiomyopathy     a. echo 11/10: EF 15%;   b. cath 10/08: normal cors;  c. Myoview 5/12: Very mild distal ant and apical isch, EF 17% (low risk-medical Rx cont'd);  d.  Echo 4/14:  EF 15%;  e. 05/2009 s/p SJM BiV ICD;  f. 07/2012 TEE: EF 20-25%, diff HK mild MR, mod TR.   Marland Kitchen Atrial fibrillation     a. on coumadin;  b. 07/2012 s/p TEE/DCCV. c. Recurrence 08/2012 in setting of abnormal thyroid panel.; 12/2012 AVN ablation by Dr [name]  . Hypertension   . Obesity   . Non-compliance   . Dyslexia     unable to read.  Marland Kitchen History of CVA (cerebrovascular accident) 11/2008    a. 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 RBBB (right bundle branch block)   . Cough syncope     a. During 08/2012 adm.  . Systolic CHF, chronic 01/19/1996  . Hypothyroidism 01/19/1996    s/p RAI therapy  . Stroke 01/19/2008  . Renal insufficiency     Past Surgical History  Procedure Laterality Date  . Bi-ventricular implantable cardioverter defibrillator  (crt-d)  06/11/09    [employer] [facility] [employer] [id number] BIVENTRICULAR AICD SERIAL #[id number]  . Knee arthroscopy Left ~ 1995  . Cardiac catheterization      "several time" (09/01/2012)  . Tee without cardioversion N/A 06/23/2012    Procedure: TRANSESOPHAGEAL ECHOCARDIOGRAM (TEE);  Surgeon: Vesta Mixer, MD;  Location: [facility] ENDOSCOPY;  Service: Cardiovascular;  Laterality: N/A;  TEE will be done at 0730   . Cardioversion N/A 07/27/2012    Procedure: CARDIOVERSION;  Surgeon: Vesta Mixer, MD;  Location: [facility] ENDOSCOPY;  Service:  Cardiovascular;  Laterality: N/A;  . Tee without cardioversion N/A 08/10/2012    Procedure: TRANSESOPHAGEAL ECHOCARDIOGRAM (TEE);  Surgeon: Wendall Stade, MD;  Location: [facility] ENDOSCOPY;  Service: Cardiovascular;  Laterality: N/A;  . Cardioversion N/A 08/10/2012    Procedure: CARDIOVERSION;  Surgeon: Wendall Stade, MD;  Location: [facility] ENDOSCOPY;  Service: Cardiovascular;  Laterality: N/A;  . Sp pta add intra cran  11/2008    a. 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 Ablation  01/09/2013    AVN ablation by Dr [name]  . Av node ablation N/A 01/09/2013    Procedure: AV NODE ABLATION;  Surgeon: Marinus Maw, MD;  Location: [facility] CATH LAB;  Service: Cardiovascular;  Laterality: N/A;  . Right heart catheterization N/A 02/15/2014    Procedure: RIGHT HEART CATH;  Surgeon: Dolores Patty, MD;  Location: [facility] CATH LAB;  Service: Cardiovascular;  Laterality: N/A;    Allergies: Valsartan  Medications: Prior to Admission medications   Medication Sig Start Date End Date Taking? Authorizing Provider  acyclovir (ZOVIRAX) 800 MG tablet Take 400 mg by mouth 2 (two) times daily.    Yes Historical Provider, MD  ALPRAZolam (XANAX) 0.25 MG tablet Take 1 tablet (0.25 mg total) by mouth at bedtime as needed for anxiety. 04/16/13  Yes Ethelda Chick, MD  carvedilol (COREG)  6.25 MG tablet Take 3 tablets (18.75 mg total) by mouth 2 (two) times daily with a meal. 12/26/13  Yes Laurey Morale, MD  cyclobenzaprine (FLEXERIL) 10 MG tablet Take 1 tablet (10 mg total) by mouth 2 (two) times daily. 07/26/13  Yes Gwenlyn Found Copland, MD  HYDROcodone-acetaminophen (NORCO/VICODIN) 5-325 MG per tablet TAKE 1 TABLET BY MOUTH EVERY 8 HOURS AS NEEDED Patient taking differently: TAKE 1 TABLET BY MOUTH EVERY 8 HOURS AS NEEDED FOR PAIN 07/30/13  Yes Gwenlyn Found Copland, MD  levothyroxine (SYNTHROID, LEVOTHROID) 125 MCG tablet Take 250 mcg by mouth daily before breakfast.   Yes Historical Provider, MD  losartan  (COZAAR) 50 MG tablet TAKE 1 TABLET BY MOUTH ONCE DAILY 12/25/13  Yes Lyn Records III, MD  metolazone (ZAROXOLYN) 2.5 MG tablet Take 2.5 mg on Monday, Wednesday and Friday. Take extra tablet as needed for weight gain (call clinic first). 02/04/14  Yes Bevelyn Buckles Bensimhon, MD  potassium chloride SA (KLOR-CON M20) 20 MEQ tablet Take 60 mEq by mouth at bedtime.   Yes Historical Provider, MD  spironolactone (ALDACTONE) 25 MG tablet Take 1 tablet (25 mg total) by mouth daily. 08/29/13  Yes Aundria Rud, NP  torsemide (DEMADEX) 20 MG tablet Take 3 tablets (60 mg total) by mouth daily. 03/14/13  Yes Aundria Rud, NP  warfarin (COUMADIN) 7.5 MG tablet Take 1 to 1.5 tablets by mouth daily as directed by coumadin clinic 09/10/13  Yes [name], MD    Family History  Problem Relation Age of Onset  . Heart disease Father 101    AMI  . Diabetes Father   . Stroke Father 10    CVA x 2  . Diabetes Sister   . Atrial fibrillation Sister   . Hyperlipidemia Mother   . Hypertension Mother   . Heart disease Mother 28    CHF    History   Social History  . Marital Status: Legally Separated    Spouse Name: N/A    Number of Children: 0  . Years of Education: N/A   Occupational History  . DISABLED    Social History Main Topics  . Smoking status: Never Smoker   . Smokeless tobacco: Never Used  . Alcohol Use: 0.0 oz/week    0 Not specified per week     Comment: Rare  . Drug Use: No  . Sexual Activity: Not Currently   Other Topics Concern  . None   Social History Narrative   Marital status: divorced since 2014; married x 16 years; dating x 4 years since CVA.      Children: none      Lives: Lives alone in house in [place].        Employment:  Disabled from CVA in 2011 (L sided weakness).      Tobacco: none      Alcohol: rarely      Drugs: none      Exercise:  Sporadically.      ADLs: drives; independent with ADLs; girlfriend does grocery shopping; ambulates with cane.      Seatbelt: 100%          Review of Systems: A 12 point ROS discussed and pertinent positives are indicated in the HPI above.  All other systems are negative.  Review of Systems  Constitutional: Positive for activity change and fatigue. Negative for fever, chills and diaphoresis.  Respiratory: Positive for shortness of breath. Negative for chest tightness.   Cardiovascular: Negative for chest  pain.  Gastrointestinal: Negative for abdominal pain.  Psychiatric/Behavioral: Negative for behavioral problems and confusion.    Vital Signs: BP 112/71 mmHg  Pulse 78  Temp(Src) 98.1 F (36.7 C) (Oral)  Resp 17  Ht  (1.753 m)  Wt 124.4 kg (274 lb 4 oz)  BMI 40.48 kg/m2  SpO2 95%  Physical Exam  Constitutional: He is oriented to person, place, and time. He appears well-nourished.  Cardiovascular: Normal rate.   No murmur heard. Pulmonary/Chest: Effort normal and breath sounds normal. He has no wheezes.  Abdominal: Soft.  Musculoskeletal:  Little use of L arm--post CVA 5 years ago  Neurological: He is alert and oriented to person, place, and time.  Skin: Skin is warm and dry.  Psychiatric: He has a normal mood and affect. His behavior is normal. Judgment and thought content normal.  Nursing note and vitals reviewed.   Imaging: Dg Chest 2 View  02/14/2014   CLINICAL DATA:  Cough and congestion for 2 weeks  EXAM: CHEST  2 VIEW  COMPARISON:  February 26, 2013.  FINDINGS: There is no edema or consolidation. There is moderate generalized cardiac enlargement with pacemaker leads attached to the right atrium and right ventricle. Pulmonary vascularity is within normal limits. No adenopathy. There is slight anterior wedging of 2 lower thoracic vertebral bodies.  IMPRESSION: No edema or consolidation.  Stable cardiac enlargement.   Electronically Signed   By: Bretta Bang M.D.   On: 02/14/2014 16:46   Ct Head Wo Contrast  02/15/2014   CLINICAL DATA:  Code stroke after vascular interventional case.  Visual loss.  EXAM: CT HEAD WITHOUT CONTRAST  TECHNIQUE: Contiguous axial images were obtained from the base of the skull through the vertex without intravenous contrast.  COMPARISON:  09/17/2009.  11/19/2008.  FINDINGS: The patient has a stent in the right middle cerebral artery. The brainstem and cerebellum are unremarkable. The left cerebral hemisphere shows chronic appearing small vessel ischemic changes within the deep white matter, progressive since 2011. No sign of acute infarction on the left. The right hemisphere shows chronic infarction in the insula, posterior frontal and parietal region with atrophy, encephalomalacia and gliosis, more extensive than was seen on the study of 2011. Chronic infarction also affects the right basal ganglia and the right thalamus. The findings have a chronic appearance however. There is no sign of acute infarction, mass lesion, hemorrhage, obstructive hydrocephalus or subdural collection.  IMPRESSION: No identifiable acute or subacute insult. Chronic right MCA stent. Chronic infarction in the right MCA territory affecting the insular region, posterior frontal and parietal region as well as the right basal ganglia and right thalamus. These chronic appearing ischemic changes are more extensive than that were in 2011.   Electronically Signed   By: Paulina Fusi M.D.   On: 02/15/2014 18:06   Dg Chest Port 1 View  02/18/2014   CLINICAL DATA:  Repositioning of Swan-Ganz catheter.  EXAM: PORTABLE CHEST - 1 VIEW  COMPARISON:  Single view of the chest 02/16/2014.  FINDINGS: Right IJ approach Swan-Ganz catheter is again identified. The catheter appears looped in the pulmonary outflow tract with the tip projecting retrograde over the right heart. The lungs are clear. There is cardiomegaly. No pneumothorax or pleural effusion.  IMPRESSION: Swan-Ganz catheter appears to be looped in the pulmonary outflow tract with the tip projecting over the right ventricle. Exact position could be  determined with a lateral film.  Cardiomegaly.   Electronically Signed   By: Drusilla Kanner M.D.  On: 02/18/2014 11:09   Dg Chest Port 1 View  02/16/2014   CLINICAL DATA:  Swan-Ganz placement after retraction under fluoroscopy.  EXAM: PORTABLE CHEST - 1 VIEW  COMPARISON:  01/20/2014  FINDINGS: Cardiac pacemaker with lead tips unchanged. Right Swan-Ganz catheter with tip projected over the pulmonary outflow tract and directed towards the right main pulmonary artery. Cardiac enlargement. No significant pulmonary vascular congestion. No blunting of costophrenic angles. No pneumothorax. No focal consolidation in the lungs.  IMPRESSION: Swan-Ganz catheter tip over the right main pulmonary artery. No pneumothorax. Cardiac enlargement.   Electronically Signed   By: Burman Nieves M.D.   On: 02/16/2014 03:32   Dg Chest Port 1 View  02/16/2014   CLINICAL DATA:  Swan-Ganz position  EXAM: PORTABLE CHEST - 1 VIEW  COMPARISON:  02/14/2014  FINDINGS: Swan-Ganz catheter with the tip projecting over the right interlobar pulmonary artery. There is no focal parenchymal opacity, pleural effusion, or pneumothorax. There is stable cardiomegaly. There is a cardiac pacer noted.  The osseous structures are unremarkable.  IMPRESSION: Swan-Ganz catheter with the tip projecting over the right interlobar pulmonary artery.   Electronically Signed   By: Elige Ko   On: 02/16/2014 02:10   Dg Fluoro Guide Cv Line-no Report  02/17/2014   CLINICAL DATA:    FLOURO GUIDE CV LINE  Fluoroscopy was utilized by the requesting physician.  No radiographic  interpretation.     Labs:  CBC:  Recent Labs  02/18/14 0518 02/19/14 0500 02/20/14 0130 02/21/14 0251  WBC 8.2 9.1 10.2 8.2  HGB 13.9 14.8 16.1 16.0  HCT 41.2 44.8 47.5 47.8  PLT 188 193 187 196    COAGS:  Recent Labs  02/18/14 0514 02/19/14 0500 02/20/14 0445 02/21/14 0251  INR 1.35 1.34 1.42 1.57*    BMP:  Recent Labs  02/18/14 0518 02/19/14 0500  02/20/14 0130 02/21/14 0251  NA 131* 133* 130* 130*  K 4.1 4.3 4.3 4.1  CL 96 95* 92* 91*  CO2 28 30 29 27   GLUCOSE 133* 164* 180* 144*  BUN 24* 17 17 19   CALCIUM 8.9 8.8 9.0 9.2  CREATININE 2.07* 1.74* 1.74* 1.84*  GFRNONAA 35* 44* 44* 41*  GFRAA 41* 51* 51* 47*    LIVER FUNCTION TESTS:  Recent Labs  08/27/13 1350  BILITOT 0.7  AST 25  ALT 17  ALKPHOS 104  PROT 6.8  ALBUMIN 4.2    TUMOR MARKERS: No results for input(s): AFPTM, CEA, CA199, CHROMGRNA in the last 8760 hours.  Assessment and Plan:  Non ischemic cardiomyopathy Need for home milrinone Scheduled for tunneled IJ central catheter Hep off now for procedure this afternoon Npo after breakfast (8am) Pt aware of procedure benefits and risks including but not limited to: Infection; bleeding; vessel damage Consent signed andin chart  Thank you for this interesting consult.  I greatly enjoyed meeting Dustan Hyams and look forward to participating in their care.  Signed: Carime Dinkel A 02/21/2014, 9:43 AM   I spent a total of 20 minutes face to face in clinical consultation, greater than 50% of which was counseling/coordinating care for tunneled IJ central catheter

## 2014-02-21 NOTE — Plan of Care (Cosign Needed)
Problem: Food- and Nutrition-Related Knowledge Deficit (NB-1.1) Goal: Nutrition education Formal process to instruct or train a patient/client in a skill or to impart knowledge to help patients/clients voluntarily manage or modify food choices and eating behavior to maintain or improve health. Outcome: Completed/Met Date Met:  02/21/14 RD consulted for nutrition education regarding low-sodium diet.  Pt was not familiar with CHO sources, and eats McDonalds, Wendy's etc. He does not eat consistently throughout the day. Pt reported not having any salt in home.  Girlfriend at bedside.  RD provided "Low-Sodium Nutrition Therapy" handout from the Academy of [employer] Dietetics. Discussed several methods to reduce sodium, such as purchasing more vegetables and fruits. Explained differences between take-out foods versus home-made foods and sodium comparisons.   Also discussed with girlfriend preparing meals on weekends when she visits. Provided meal options, such as crock pot recipes, meat preparation and rinsing canned vegetables. Teach back method used.  Expect fair compliance.  Body mass index 48.4 kg/(m^2). Pt meets criteria for obesity class III based on current BMI.  Current diet order is Heart Healthy. Labs and medications reviewed. No further nutrition interventions warranted at this time. If additional nutrition issues arise, please contact RD.  Wynona Dove, MS Dietetic Intern Pager: [phone]

## 2014-02-21 NOTE — Progress Notes (Signed)
CARDIAC REHAB PHASE I   PRE:  Rate/Rhythm: 70 paced    BP: sitting 130/90    SaO2:   MODE:  Ambulation: 350 ft   POST:  Rate/Rhythm: 91 pacing    BP: sitting 134/95     SaO2:   Pt somewhat steadier today. Used gait belt, cane, assist x2. More SOB the farther he walked. Rest x1 toward end.  Wanted to go back to bed. Somewhat irritable. Did discuss low sodium with pt somewhat. He sts he is limited to canned Ravioli and only eats 1 can a day (nothing else). Sts he can't cook due to his CVA and struggles to open cans with a can opener so gets cans with pop top. Discussed using frozen bags of vegetables, peanut butter, eggs, fresh veggies/fruit. Resources are limited and he sts he doesn't qualify for many assistance programs. Will discuss with CM.  [phone]  Elissa Lovett [place] CES, ACSM 02/21/2014 11:39 AM

## 2014-02-21 NOTE — Progress Notes (Signed)
ANTICOAGULATION CONSULT NOTE - Follow Up Consult  Pharmacy Consult for Heparin and Coumadin Indication: atrial fibrillation  Allergies  Allergen Reactions  . Valsartan Cough    Patient Measurements: Height: 5\' 9"  (175.3 cm) Weight: 274 lb 4 oz (124.4 kg) IBW/kg (Calculated) : 70.7 Heparin Dosing Weight: ~99.8kg  Vital Signs: Temp: 97.6 F (36.4 C) (02/04 1230) Temp Source: Oral (02/04 1230) BP: 118/76 mmHg (02/04 1230)  Labs:  Recent Labs  02/19/14 0500  02/19/14 1950 02/20/14 0130 02/20/14 0445 02/21/14 0251  HGB 14.8  --   --  16.1  --  16.0  HCT 44.8  --   --  47.5  --  47.8  PLT 193  --   --  187  --  196  LABPROT 16.7*  --   --   --  17.5* 18.9*  INR 1.34  --   --   --  1.42 1.57*  HEPARINUNFRC 0.85*  < > 0.70  --  0.60 0.46  CREATININE 1.74*  --   --  1.74*  --  1.84*  < > = values in this interval not displayed.  Estimated Creatinine Clearance: 61.9 mL/min (by C-G formula based on Cr of 1.84).    Assessment: 53yom on coumadin pta for afib, hx CVA. INR on admission was subtherapeutic so heparin bridge added. Heparin level is therapeutic at 0.46. Coumadin was resumed 2/1 after decision made to not place IABP.  INR 1.57  remains below goal.  CBC stable. No bleeding reported. Amiodarine started 2/1 - will monitor INR closely. Home dose: 7.5mg  daily except 11.25mg  on Mon/Thurs/Sat  Current;y Heparin drip off for planned tunneled central line placement for home milrinone today   Goal of Therapy:  INR 2-3  Heparin level 0.3-0.7 Monitor platelets by anticoagulation protocol: Yes   Plan:  1) Follow Up restart heparin at 1150 units/hr 2) Increase coumadin to 15mg  x 1 3) Follow up heparin level, INR, CBC in AM  Leota Sauers Pharm.D. CPP, BCPS Clinical Pharmacist [phone] 02/21/2014 1:52 PM

## 2014-02-21 NOTE — Progress Notes (Signed)
I came to visit with [name] but he was visiting with dietician. I will follow up again tomorrow.   Yong Channel, NP Palliative Medicine Team Pager # [phone] (M-F 8a-5p) Team Phone # [phone] (Nights/Weekends)

## 2014-02-22 DIAGNOSIS — Z66 Do not resuscitate: Secondary | ICD-10-CM | POA: Diagnosis not present

## 2014-02-22 LAB — BASIC METABOLIC PANEL
ANION GAP: 9 (ref 5–15)
BUN: 22 mg/dL (ref 6–23)
CHLORIDE: 92 mmol/L — AB (ref 96–112)
CO2: 29 mmol/L (ref 19–32)
Calcium: 9.3 mg/dL (ref 8.4–10.5)
Creatinine, Ser: 1.79 mg/dL — ABNORMAL HIGH (ref 0.50–1.35)
GFR, EST AFRICAN AMERICAN: 49 mL/min — AB (ref >90)
GFR, EST NON AFRICAN AMERICAN: 42 mL/min — AB (ref >90)
GLUCOSE: 137 mg/dL — AB (ref 70–99)
POTASSIUM: 4.1 mmol/L (ref 3.5–5.1)
SODIUM: 130 mmol/L — AB (ref 135–145)

## 2014-02-22 LAB — HEPARIN LEVEL (UNFRACTIONATED): Heparin Unfractionated: 0.24 IU/mL — ABNORMAL LOW (ref 0.30–0.70)

## 2014-02-22 LAB — CBC
HCT: 44.4 % (ref 39.0–52.0)
Hemoglobin: 15.1 g/dL (ref 13.0–17.0)
MCH: 30.7 pg (ref 26.0–34.0)
MCHC: 34 g/dL (ref 30.0–36.0)
MCV: 90.2 fL (ref 78.0–100.0)
Platelets: 183 10*3/uL (ref 150–400)
RBC: 4.92 MIL/uL (ref 4.22–5.81)
RDW: 14.5 % (ref 11.5–15.5)
WBC: 9.1 10*3/uL (ref 4.0–10.5)

## 2014-02-22 LAB — CARBOXYHEMOGLOBIN
Carboxyhemoglobin: 0.9 % (ref 0.5–1.5)
Methemoglobin: 0.9 % (ref 0.0–1.5)
O2 Saturation: 68.1 %
Total hemoglobin: 16.2 g/dL (ref 13.5–18.0)

## 2014-02-22 LAB — PROTIME-INR
INR: 2.01 — AB (ref 0.00–1.49)
Prothrombin Time: 23 seconds — ABNORMAL HIGH (ref 11.6–15.2)

## 2014-02-22 MED ORDER — MILRINONE IN DEXTROSE 20 MG/100ML IV SOLN: 0.3750 ug/kg/min | INTRAVENOUS | Status: DC

## 2014-02-22 MED ORDER — WARFARIN SODIUM 7.5 MG PO TABS: ORAL_TABLET | ORAL | Status: DC

## 2014-02-22 MED ORDER — METOLAZONE 2.5 MG PO TABS: 2.5000 mg | ORAL_TABLET | ORAL | Status: DC | PRN

## 2014-02-22 MED ORDER — AMIODARONE HCL 200 MG PO TABS: 200.0000 mg | ORAL_TABLET | Freq: Two times a day (BID) | ORAL | Status: DC

## 2014-02-22 NOTE — Progress Notes (Signed)
CARDIAC REHAB PHASE I   Pt declined walking this am. He is anxious to go home. Reviewed low sodium diet and eating out options. Pt receptive. sts he is getting a scale from insurance that communicates. Fair reception. Pt sts he would rather eat what he wants sometimes than be miserable. Encouraged moderation when he does this and the risk of being hospitalized due to excessive sodium and wt gain.  [phone]  Elissa Lovett [place] CES, ACSM 02/22/2014 9:26 AM

## 2014-02-22 NOTE — Progress Notes (Signed)
Advanced Heart Failure Rounding Note   Subjective:    PICC in place. Feels good but very frustrated. Wants to go home. Co-ox improved on milrinone. Renal function stable. INR 2.0   Objective:   Weight Range:  Vital Signs:   Temp:  [97.6 F (36.4 C)-98.2 F (36.8 C)] 97.7 F (36.5 C) (02/05 0500) Resp:  [12-23] 23 (02/05 0500) BP: (105-139)/(64-90) 109/64 mmHg (02/05 0500) SpO2:  [96 %-98 %] 98 % (02/05 0500) Last BM Date: 02/21/14  Weight change: Filed Weights   02/19/14 0700 02/20/14 0500 02/21/14 0500  Weight: 126.8 kg (279 lb 8.7 oz) 124.6 kg (274 lb 11.1 oz) 124.4 kg (274 lb 4 oz)    Intake/Output:   Intake/Output Summary (Last 24 hours) at 02/22/14 0858 Last data filed at 02/22/14 0600  Gross per 24 hour  Intake 1072.3 ml  Output   1460 ml  Net -387.7 ml     Physical Exam:  General: Lying in bed.  HEENT: normal Neck: supple.CVP ~7  Carotids 2+ bilaterally; no bruits. No lymphadenopathy or thryomegaly appreciated. Cor: PMI normal. Regular rate & rhythm. +s3 Lungs: clear Abdomen: Obese; soft, nontender, non distended. No hepatosplenomegaly. No bruits or masses. Good bowel sounds. Extremities: no cyanosis, clubbing, rash, tr edema Neuro: alert & orientedx3, cranial nerves grossly intact. LUE weak  Telemetry: AF with v pacing with frequent PVCs  Labs: Basic Metabolic Panel:  Recent Labs Lab 02/18/14 0518 02/19/14 0500 02/20/14 0130 02/21/14 0251 02/22/14 0540  NA 131* 133* 130* 130* 130*  K 4.1 4.3 4.3 4.1 4.1  CL 96 95* 92* 91* 92*  CO2 GLUCOSE 133* 164* 180* 144* 137*  BUN 24* CREATININE 2.07* 1.74* 1.74* 1.84* 1.79*  CALCIUM 8.9 8.8 9.0 9.2 9.3  MG 1.7 1.9  --   --   --     Liver Function Tests: No results for input(s): AST, ALT, ALKPHOS, BILITOT, PROT, ALBUMIN in the last 168 hours. No results for input(s): LIPASE, AMYLASE in the last 168 hours. No results for input(s): AMMONIA in the last 168  hours.  CBC:  Recent Labs Lab 02/18/14 0518 02/19/14 0500 02/20/14 0130 02/21/14 0251 02/22/14 0540  WBC 8.2 9.1 10.2 8.2 9.1  HGB 13.9 14.8 16.1 16.0 15.1  HCT 41.2 44.8 47.5 47.8 44.4  MCV 90.9 93.5 93.3 90.5 90.2  PLT 188 193 187 196 183    Cardiac Enzymes: No results for input(s): CKTOTAL, CKMB, CKMBINDEX, TROPONINI in the last 168 hours.  BNP: BNP (last 3 results)  Recent Labs  03/14/13 1412 04/02/13 1417 12/26/13 1205  PROBNP 3595.0* 2869.0* 4324.0*     Other results:  :   Imaging: Ir Fluoro Guide Cv Line Right  02/21/2014   INDICATION: 54 year old with chronic systolic heart failure. Patient needs chronic central venous access for milrinone infusion.  EXAM: FLUOROSCOPIC AND ULTRASOUND GUIDED PLACEMENT OF A TUNNELED CENTRAL VENOUS CATHETER  Physician: Rachelle Hora. Lowella Dandy, MD  FLUOROSCOPY TIME:  1.0 min  MEDICATIONS: None  ANESTHESIA/SEDATION: None  PROCEDURE: The procedure was explained to the patient. The risks and benefits of the procedure were discussed and the patient's questions were addressed. Informed consent was obtained from the patient. The patient was placed supine on the interventional table. Ultrasound confirmed a patent right internal jugularvein. Ultrasound images were obtained for documentation. The right side of the neck was prepped and draped in a sterile fashion. The right side of the neck was anesthetized with  1% lidocaine. Maximal barrier sterile technique was utilized including caps, mask, sterile gowns, sterile gloves, sterile drape, hand hygiene and skin antiseptic. A small incision was made with #11 blade scalpel. A 21 gauge needle directed into the right internal jugular vein with ultrasound guidance. A micropuncture dilator set was placed. A single lumen Powerline catheter was selected. The skin below the right clavicle was anesthetized and a small incision was made with an #11 blade scalpel. A subcutaneous tunnel was formed to the vein dermatotomy  site. The catheter was brought through the tunnel. A peel-away sheath was placed. The catheter was placed through the peel-away sheath and directed into the central venous structures. The tip of the catheter was placed at the superior cavoatrial junction with fluoroscopy. Fluoroscopic images were obtained for documentation. Lumen aspirated and flushed well. The vein dermatotomy site was closed using a single layer of absorbable suture and Dermabond. The catheter was secured to the skin using Prolene suture.  FINDINGS: Catheter tip at the superior cavoatrial junction.  COMPLICATIONS: None  IMPRESSION: Successful placement of a tunneled central venous catheter using ultrasound and fluoroscopic guidance.   Electronically Signed   By: Richarda Overlie M.D.   On: 02/21/2014 18:04   Ir US Guide Vasc Access Right  02/21/2014   INDICATION: 54 year old with chronic systolic heart failure. Patient needs chronic central venous access for milrinone infusion.  EXAM: FLUOROSCOPIC AND ULTRASOUND GUIDED PLACEMENT OF A TUNNELED CENTRAL VENOUS CATHETER  Physician: Rachelle Hora. Lowella Dandy, MD  FLUOROSCOPY TIME:  1.0 min  MEDICATIONS: None  ANESTHESIA/SEDATION: None  PROCEDURE: The procedure was explained to the patient. The risks and benefits of the procedure were discussed and the patient's questions were addressed. Informed consent was obtained from the patient. The patient was placed supine on the interventional table. Ultrasound confirmed a patent right internal jugularvein. Ultrasound images were obtained for documentation. The right side of the neck was prepped and draped in a sterile fashion. The right side of the neck was anesthetized with 1% lidocaine. Maximal barrier sterile technique was utilized including caps, mask, sterile gowns, sterile gloves, sterile drape, hand hygiene and skin antiseptic. A small incision was made with #11 blade scalpel. A 21 gauge needle directed into the right internal jugular vein with ultrasound guidance. A  micropuncture dilator set was placed. A single lumen Powerline catheter was selected. The skin below the right clavicle was anesthetized and a small incision was made with an #11 blade scalpel. A subcutaneous tunnel was formed to the vein dermatotomy site. The catheter was brought through the tunnel. A peel-away sheath was placed. The catheter was placed through the peel-away sheath and directed into the central venous structures. The tip of the catheter was placed at the superior cavoatrial junction with fluoroscopy. Fluoroscopic images were obtained for documentation. Lumen aspirated and flushed well. The vein dermatotomy site was closed using a single layer of absorbable suture and Dermabond. The catheter was secured to the skin using Prolene suture.  FINDINGS: Catheter tip at the superior cavoatrial junction.  COMPLICATIONS: None  IMPRESSION: Successful placement of a tunneled central venous catheter using ultrasound and fluoroscopic guidance.   Electronically Signed   By: Richarda Overlie M.D.   On: 02/21/2014 18:04     Medications:     Scheduled Medications: . amiodarone  200 mg Oral BID  . cyclobenzaprine  10 mg Oral BID  . levothyroxine  250 mcg Oral QAC breakfast  . sodium chloride  3 mL Intravenous Q12H  . spironolactone  25  mg Oral Daily  . torsemide  60 mg Oral Daily  . Warfarin - Pharmacist Dosing Inpatient   Does not apply q1800    Infusions: . heparin 1,250 Units/hr (02/22/14 0430)  . milrinone 0.375 mcg/kg/min (02/21/14 2325)    PRN Medications: sodium chloride, acetaminophen, ALPRAZolam, dextromethorphan, HYDROcodone-acetaminophen, ondansetron (ZOFRAN) IV, sodium chloride   Assessment:   1. A/c systolic HF 2. NICM EF 10% 3. Chronic AF s/p AVN ablation and CRT-D 4. Acute on chronic renal failure stage IV 5. H/o CVA 6. Morbid obesity 7. Hypokalemia/hyponatremia 8 Cardiogenic shock   Plan/Discussion:    Much improved with milrinone. Volume status better. Renal function  stable. Tunneled picc in place. Weight stable on po diuretics. Home tomorrow with  Milrinone at 0.375. Continue po amiodarone.   Daniel Bensimhon,MD 8:58 AM

## 2014-02-22 NOTE — Progress Notes (Signed)
ANTICOAGULATION CONSULT NOTE - Follow Up Consult  Pharmacy Consult for Coumadin Indication: atrial fibrillation  Allergies  Allergen Reactions  . Valsartan Cough    Patient Measurements: Height: 5\' 9"  (175.3 cm) Weight: 275 lb 14.4 oz (125.147 kg) IBW/kg (Calculated) : 70.7  Vital Signs: Temp: 97.7 F (36.5 C) (02/05 0500) Temp Source: Tympanic (02/05 0500) BP: 109/64 mmHg (02/05 0500)  Labs:  Recent Labs  02/20/14 0130 02/20/14 0445 02/21/14 0251 02/22/14 0145 02/22/14 0540  HGB 16.1  --  16.0  --  15.1  HCT 47.5  --  47.8  --  44.4  PLT 187  --  196  --  183  LABPROT  --  17.5* 18.9*  --  23.0*  INR  --  1.42 1.57*  --  2.01*  HEPARINUNFRC  --  0.60 0.46 0.24*  --   CREATININE 1.74*  --  1.84*  --  1.79*    Estimated Creatinine Clearance: 63.9 mL/min (by C-G formula based on Cr of 1.79).  Medications: Heparin @ 1250 units/hr   Assessment: 53yom on coumadin pta for afib, hx CVA. INR on admission was subtherapeutic so heparin bridge added. Coumadin was resumed 2/1 after decision made to not place IABP. IV amiodarone also started 2/1, changed to PO on 2/3. He received a few higher doses of coumadin since INR was very slow to move. INR is finally therapeutic today. Can d/c heparin.  Home dose: 7.5mg  daily except 11.25mg  on Mon/Thurs/Sat   Goal of Therapy:  INR 2-3  Monitor platelets by anticoagulation protocol: Yes   Plan:  1) Home today on coumadin 7.5mg  daily (decrease home dose due to addition of amiodarone) 2) Follow up INR in clinic next week  Louie Casa, PharmD, BCPS 02/22/2014 9:28 AM

## 2014-02-22 NOTE — Discharge Summary (Signed)
Advanced Heart Failure Team  Discharge Summary   Patient ID: Raymond [name] MRN: [id number], DOB/AGE: 52/06/1960 52 y.o. Admit date: 02/14/2014 D/C date:     02/22/2014   Primary Discharge Diagnoses:  1. Cardiogenic shock 2. Acute on chronic systolic HF due to NICM, EF 10%  3. Acute on chronic renal failure 4. Chronic atrial fibrillation s/p AVN ablation 5. History of CVA with L-sided weakness   Hospital Course:   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   Admitted on 1/28 for recurrent HF with low output symptoms. Poor response to IV diuresis with worsening creatinine. Taken for RHC on 1/29 which showed high filling pressures (PCWP 30) and low output (CI 1.4). Post cath had ? Brief seizure/syncopal episdoe on table felt to be due to hypoxia/coughing. B-blocker stopped. Started on milrinone with marked improvement in symptoms. Does titrated to 0.375 based on co-ox. Diuresed well with IV lasix. Discharge weight 174. Creatinine stabilized at 1.7. Was having frequent PVCs and amio started to suppress. Coumadin restarted with heparin bridge. INR at discharge 2.0. Torsemide changed to 60 daily. Tunneled PICC placed by IR in [facility].   Felt not to be candidate for LVAD or transplant due to previous noncompliance and social situation.   Discharge Weight Range: 275 pounds Discharge Vitals: Blood pressure 124/90, pulse 78, temperature 98 F (36.7 C), temperature source Oral, resp. rate 15, height  (1.753 m), weight 275 lb 14.4 oz (125.147 kg), SpO2 98 %.  Labs: Lab Results  Component Value Date   WBC 9.1 02/22/2014   HGB 15.1 02/22/2014   HCT 44.4 02/22/2014   MCV 90.2 02/22/2014   PLT 183 02/22/2014     Recent Labs Lab 02/22/14 0540  NA 130*  K 4.1  CL 92*  CO2 29  BUN 22  CREATININE 1.79*   CALCIUM 9.3  GLUCOSE 137*   Lab Results  Component Value Date   CHOL  11/19/2008    135        ATP III CLASSIFICATION:  <200     mg/dL   Desirable  914-782  mg/dL   Borderline High  >=956    mg/dL   High          HDL 32* 11/19/2008   LDLCALC  11/19/2008    79        Total Cholesterol/HDL:CHD Risk Coronary Heart Disease Risk Table                     Men   Women  1/2 Average Risk   3.4   3.3  Average Risk       5.0   4.4  2 X Average Risk   9.6   7.1  3 X Average Risk  23.4   11.0        Use the calculated Patient Ratio above and the CHD Risk Table to determine the patient's CHD Risk.        ATP III CLASSIFICATION (LDL):  <100     mg/dL   Optimal  213-086  mg/dL   Near or Above                    Optimal  130-159  mg/dL   Borderline  578-469  mg/dL   High  >  190     mg/dL   Very High   TRIG 161 11/19/2008   BNP (last 3 results)  Recent Labs  02/14/14 1524 02/15/14 0500  BNP 897.2* 616.4*    ProBNP (last 3 results)  Recent Labs  03/14/13 1412 04/02/13 1417 12/26/13 1205  PROBNP 3595.0* 2869.0* 4324.0*     Diagnostic Studies/Procedures   Ir Fluoro Guide Cv Line Right  02/21/2014   INDICATION: 54 year old with chronic systolic heart failure. Patient needs chronic central venous access for milrinone infusion.  EXAM: FLUOROSCOPIC AND ULTRASOUND GUIDED PLACEMENT OF A TUNNELED CENTRAL VENOUS CATHETER  Physician: Rachelle Hora. Lowella Dandy, MD  FLUOROSCOPY TIME:  1.0 min  MEDICATIONS: None  ANESTHESIA/SEDATION: None  PROCEDURE: The procedure was explained to the patient. The risks and benefits of the procedure were discussed and the patient's questions were addressed. Informed consent was obtained from the patient. The patient was placed supine on the interventional table. Ultrasound confirmed a patent right internal jugularvein. Ultrasound images were obtained for documentation. The right side of the neck was prepped and draped in a sterile fashion. The right side of the neck was  anesthetized with 1% lidocaine. Maximal barrier sterile technique was utilized including caps, mask, sterile gowns, sterile gloves, sterile drape, hand hygiene and skin antiseptic. A small incision was made with #11 blade scalpel. A 21 gauge needle directed into the right internal jugular vein with ultrasound guidance. A micropuncture dilator set was placed. A single lumen Powerline catheter was selected. The skin below the right clavicle was anesthetized and a small incision was made with an #11 blade scalpel. A subcutaneous tunnel was formed to the vein dermatotomy site. The catheter was brought through the tunnel. A peel-away sheath was placed. The catheter was placed through the peel-away sheath and directed into the central venous structures. The tip of the catheter was placed at the superior cavoatrial junction with fluoroscopy. Fluoroscopic images were obtained for documentation. Lumen aspirated and flushed well. The vein dermatotomy site was closed using a single layer of absorbable suture and Dermabond. The catheter was secured to the skin using Prolene suture.  FINDINGS: Catheter tip at the superior cavoatrial junction.  COMPLICATIONS: None  IMPRESSION: Successful placement of a tunneled central venous catheter using ultrasound and fluoroscopic guidance.   Electronically Signed   By: Richarda Overlie M.D.   On: 02/21/2014 18:04   Ir US Guide Vasc Access Right  02/21/2014   INDICATION: 54 year old with chronic systolic heart failure. Patient needs chronic central venous access for milrinone infusion.  EXAM: FLUOROSCOPIC AND ULTRASOUND GUIDED PLACEMENT OF A TUNNELED CENTRAL VENOUS CATHETER  Physician: Rachelle Hora. Lowella Dandy, MD  FLUOROSCOPY TIME:  1.0 min  MEDICATIONS: None  ANESTHESIA/SEDATION: None  PROCEDURE: The procedure was explained to the patient. The risks and benefits of the procedure were discussed and the patient's questions were addressed. Informed consent was obtained from the patient. The patient was placed  supine on the interventional table. Ultrasound confirmed a patent right internal jugularvein. Ultrasound images were obtained for documentation. The right side of the neck was prepped and draped in a sterile fashion. The right side of the neck was anesthetized with 1% lidocaine. Maximal barrier sterile technique was utilized including caps, mask, sterile gowns, sterile gloves, sterile drape, hand hygiene and skin antiseptic. A small incision was made with #11 blade scalpel. A 21 gauge needle directed into the right internal jugular vein with ultrasound guidance. A micropuncture dilator set was placed. A single lumen Powerline catheter was selected. The skin  below the right clavicle was anesthetized and a small incision was made with an #11 blade scalpel. A subcutaneous tunnel was formed to the vein dermatotomy site. The catheter was brought through the tunnel. A peel-away sheath was placed. The catheter was placed through the peel-away sheath and directed into the central venous structures. The tip of the catheter was placed at the superior cavoatrial junction with fluoroscopy. Fluoroscopic images were obtained for documentation. Lumen aspirated and flushed well. The vein dermatotomy site was closed using a single layer of absorbable suture and Dermabond. The catheter was secured to the skin using Prolene suture.  FINDINGS: Catheter tip at the superior cavoatrial junction.  COMPLICATIONS: None  IMPRESSION: Successful placement of a tunneled central venous catheter using ultrasound and fluoroscopic guidance.   Electronically Signed   By: Richarda Overlie M.D.   On: 02/21/2014 18:04    Discharge Medications     Medication List    STOP taking these medications        carvedilol 6.25 MG tablet  Commonly known as:  COREG     KLOR-CON M20 20 MEQ tablet  Generic drug:  potassium chloride SA     losartan 50 MG tablet  Commonly known as:  COZAAR      TAKE these medications        acyclovir 800 MG tablet   Commonly known as:  ZOVIRAX  Take 400 mg by mouth 2 (two) times daily.     ALPRAZolam 0.25 MG tablet  Commonly known as:  XANAX  Take 1 tablet (0.25 mg total) by mouth at bedtime as needed for anxiety.     amiodarone 200 MG tablet  Commonly known as:  PACERONE  Take 1 tablet (200 mg total) by mouth 2 (two) times daily.     cyclobenzaprine 10 MG tablet  Commonly known as:  FLEXERIL  Take 1 tablet (10 mg total) by mouth 2 (two) times daily.     HYDROcodone-acetaminophen 5-325 MG per tablet  Commonly known as:  NORCO/VICODIN  TAKE 1 TABLET BY MOUTH EVERY 8 HOURS AS NEEDED     levothyroxine 125 MCG tablet  Commonly known as:  SYNTHROID, LEVOTHROID  Take 250 mcg by mouth daily before breakfast.     metolazone 2.5 MG tablet  Commonly known as:  ZAROXOLYN  Take 1 tablet (2.5 mg total) by mouth as needed.     milrinone 20 MG/100ML Soln infusion  Commonly known as:  PRIMACOR  Inject 48.45 mcg/min into the vein continuous.     spironolactone 25 MG tablet  Commonly known as:  ALDACTONE  Take 1 tablet (25 mg total) by mouth daily.     torsemide 20 MG tablet  Commonly known as:  DEMADEX  Take 3 tablets (60 mg total) by mouth daily.     warfarin 7.5 MG tablet  Commonly known as:  COUMADIN  Take 1 tablet daily        Disposition   The patient will be discharged in stable condition to home. Discharge Instructions    Contraindication to ARB at discharge    Complete by:  As directed      Diet - low sodium heart healthy    Complete by:  As directed      Heart Failure patients record your daily weight using the same scale at the same time of day    Complete by:  As directed      Increase activity slowly    Complete by:  As directed  Follow-up Information    Follow up with Arvilla Meres, MD On 03/01/2014.   Specialty:  Cardiology   Why:  at 9:20    Contact information:   [address] [place] [address] [phone]          Duration of Discharge Encounter: Greater than 35 minutes   Migdalia Dk  02/22/2014, 11:19 AM

## 2014-02-22 NOTE — Progress Notes (Signed)
Progress Note from the Palliative Medicine Team at [facility]: [name] is lying in bed in no distress. He is anxious and happy to be going home today. We are still awaiting answer from [facility] if they will accept him. Dietician met with him yesterday. He has no complaints except he has not been able to sleep here and is hoping this will improve when he is home. I did ask him if he had every considered if he would want to be on a breathing machine or receive CPR and he clearly told me that he would not want to go through any of that - "if it comes to that just let me go." He has a good understanding of his poor prognosis.     Objective: Allergies  Allergen Reactions  . Valsartan Cough   Scheduled Meds: . amiodarone  200 mg Oral BID  . cyclobenzaprine  10 mg Oral BID  . levothyroxine  250 mcg Oral QAC breakfast  . sodium chloride  3 mL Intravenous Q12H  . spironolactone  25 mg Oral Daily  . torsemide  60 mg Oral Daily  . Warfarin - Pharmacist Dosing Inpatient   Does not apply q1800   Continuous Infusions: . milrinone 0.375 mcg/kg/min (02/21/14 2325)   PRN Meds:.sodium chloride, acetaminophen, ALPRAZolam, dextromethorphan, HYDROcodone-acetaminophen, ondansetron (ZOFRAN) IV, sodium chloride  BP 124/90 mmHg  Pulse 78  Temp(Src) 98 F (36.7 C) (Oral)  Resp 15  Ht 5' 9"  (1.753 m)  Wt 125.147 kg (275 lb 14.4 oz)  BMI 40.72 kg/m2  SpO2 98%   PPS: 40%   Intake/Output Summary (Last 24 hours) at 02/22/14 1134 Last data filed at 02/22/14 1000  Gross per 24 hour  Intake 1586.55 ml  Output   1760 ml  Net -173.45 ml       Physical Exam:  General: NAD, lying in bed HEENT: [place]/AT, +JVD, moist mucous membranes Chest: CTA throughout, no labored breathing at now CVS: RRR Abdomen: Soft, NT, ND, +BS Ext: MAE - weakened LUE (h/o CVA), trace BLE edema, warm to touch Neuro: Awake, alert, oriented x 3   Labs: CBC    Component Value Date/Time   WBC 9.1  02/22/2014 0540   WBC 7.5 04/28/2012 1200   RBC 4.92 02/22/2014 0540   RBC 4.49* 04/28/2012 1200   HGB 15.1 02/22/2014 0540   HGB 13.5* 04/28/2012 1200   HCT 44.4 02/22/2014 0540   HCT 43.6 04/28/2012 1200   PLT 183 02/22/2014 0540   MCV 90.2 02/22/2014 0540   MCV 97.0 04/28/2012 1200   MCH 30.7 02/22/2014 0540   MCH 30.1 04/28/2012 1200   MCHC 34.0 02/22/2014 0540   MCHC 31.0* 04/28/2012 1200   RDW 14.5 02/22/2014 0540   LYMPHSABS 1.4 08/27/2013 1350   MONOABS 0.9 08/27/2013 1350   EOSABS 0.2 08/27/2013 1350   BASOSABS 0.1 08/27/2013 1350    BMET    Component Value Date/Time   NA 130* 02/22/2014 0540   K 4.1 02/22/2014 0540   CL 92* 02/22/2014 0540   CO2 29 02/22/2014 0540   GLUCOSE 137* 02/22/2014 0540   BUN 22 02/22/2014 0540   CREATININE 1.79* 02/22/2014 0540   CREATININE 1.53* 08/27/2013 1350   CALCIUM 9.3 02/22/2014 0540   GFRNONAA 42* 02/22/2014 0540   GFRNONAA 52* 08/27/2013 1350   GFRAA 49* 02/22/2014 0540   GFRAA 60 08/27/2013 1350    CMP     Component Value Date/Time   NA 130* 02/22/2014 0540  K 4.1 02/22/2014 0540   CL 92* 02/22/2014 0540   CO2 29 02/22/2014 0540   GLUCOSE 137* 02/22/2014 0540   BUN 22 02/22/2014 0540   CREATININE 1.79* 02/22/2014 0540   CREATININE 1.53* 08/27/2013 1350   CALCIUM 9.3 02/22/2014 0540   PROT 6.8 08/27/2013 1350   ALBUMIN 4.2 08/27/2013 1350   AST 25 08/27/2013 1350   ALT 17 08/27/2013 1350   ALKPHOS 104 08/27/2013 1350   BILITOT 0.7 08/27/2013 1350   GFRNONAA 42* 02/22/2014 0540   GFRNONAA 52* 08/27/2013 1350   GFRAA 49* 02/22/2014 0540   GFRAA 60 08/27/2013 1350    Assessment and Plan: 1. Code Status: DNR 2. Symptom Control: 1. Sleep Disturbance: He says the environment is distracting. Decrease stimulation and limit nighttime interruptions. May consider low dose trazodone if needed.  2. Shortness of breath: Continue milrinone and medical management.  3. Psycho/Social: Emotional support provided to  patient.  4. Disposition: Home with milrinone planned today.    Time In Time Out Total Time Spent with Patient Total Overall Time  1110 1130 17mn 283m    Greater than 50%  of this time was spent counseling and coordinating care related to the above assessment and plan.  AlVinie SillNP Palliative Medicine Team Pager # [phone]-F 8a-5p) Team Phone # [phone]/Weekends)

## 2014-02-22 NOTE — Progress Notes (Signed)
Attempted to give patient 6am medications and record daily weight. Upon waking patient he became very agitated and stated "Im getting the fuck out of here." Patient expressed irritation about having to be woken up during the night. Claimed that he could not get any rest here. Will attempt to give medications and weigh patient at later time.

## 2014-02-22 NOTE — Progress Notes (Signed)
ANTICOAGULATION CONSULT NOTE - Follow Up Consult  Pharmacy Consult for Heparin  Indication: atrial fibrillation  Allergies  Allergen Reactions  . Valsartan Cough    Patient Measurements: Height: 5\' 9"  (175.3 cm) Weight: 274 lb 4 oz (124.4 kg) IBW/kg (Calculated) : 70.7  Vital Signs: Temp: 98 F (36.7 C) (02/05 0000) Temp Source: Oral (02/05 0000) BP: 114/73 mmHg (02/05 0000)  Labs:  Recent Labs  02/19/14 0500  02/20/14 0130 02/20/14 0445 02/21/14 0251 02/22/14 0145  HGB 14.8  --  16.1  --  16.0  --   HCT 44.8  --  47.5  --  47.8  --   PLT 193  --  187  --  196  --   LABPROT 16.7*  --   --  17.5* 18.9*  --   INR 1.34  --   --  1.42 1.57*  --   HEPARINUNFRC 0.85*  < >  --  0.60 0.46 0.24*  CREATININE 1.74*  --  1.74*  --  1.84*  --   < > = values in this interval not displayed.  Estimated Creatinine Clearance: 61.9 mL/min (by C-G formula based on Cr of 1.84).  Assessment:  Sub-therapeutic heparin level x 1, no issues per RN. Heparin was off earlier in the day on 2/4 for line placement.   Goal of Therapy:  Heparin level 0.3-0.7 units/ml Monitor platelets by anticoagulation protocol: Yes   Plan:  -Increase heparin drip to 1250 units/hr -0900 HL -Daily CBC/HL -Monitor for bleeding  Abran Duke 02/22/2014,2:44 AM

## 2014-02-27 ENCOUNTER — Ambulatory Visit (HOSPITAL_COMMUNITY)
Admission: RE | Admit: 2014-02-27 | Discharge: 2014-02-27 | Disposition: A | Discharge status: Home / Self Care | Payer: Medicare Other | Source: Ambulatory Visit | Attending: Internal Medicine | Admitting: Internal Medicine

## 2014-02-27 ENCOUNTER — Ambulatory Visit (INDEPENDENT_AMBULATORY_CARE_PROVIDER_SITE_OTHER): Payer: Medicare Other | Admitting: *Deleted

## 2014-02-27 ENCOUNTER — Telehealth: Payer: Self-pay

## 2014-02-27 VITALS — BP 108/58 | HR 68 | Wt 263.5 lb

## 2014-02-27 DIAGNOSIS — I5022 Chronic systolic (congestive) heart failure: Secondary | ICD-10-CM

## 2014-02-27 DIAGNOSIS — R480 Dyslexia and alexia: Secondary | ICD-10-CM | POA: Insufficient documentation

## 2014-02-27 DIAGNOSIS — Z66 Do not resuscitate: Secondary | ICD-10-CM

## 2014-02-27 DIAGNOSIS — Z7901 Long term (current) use of anticoagulants: Secondary | ICD-10-CM

## 2014-02-27 DIAGNOSIS — Z9119 Patient's noncompliance with other medical treatment and regimen: Secondary | ICD-10-CM | POA: Insufficient documentation

## 2014-02-27 DIAGNOSIS — I10 Essential (primary) hypertension: Secondary | ICD-10-CM | POA: Insufficient documentation

## 2014-02-27 DIAGNOSIS — N183 Chronic kidney disease, stage 3 unspecified: Secondary | ICD-10-CM

## 2014-02-27 DIAGNOSIS — I159 Secondary hypertension, unspecified: Secondary | ICD-10-CM

## 2014-02-27 DIAGNOSIS — I4891 Unspecified atrial fibrillation: Secondary | ICD-10-CM

## 2014-02-27 DIAGNOSIS — Z5181 Encounter for therapeutic drug level monitoring: Secondary | ICD-10-CM

## 2014-02-27 LAB — POCT INR: INR: 1.9

## 2014-02-27 MED ORDER — AMIODARONE HCL 200 MG PO TABS: 200.0000 mg | ORAL_TABLET | Freq: Every day | ORAL | Status: DC

## 2014-02-27 MED ORDER — TORSEMIDE 20 MG PO TABS: 60.0000 mg | ORAL_TABLET | Freq: Every day | ORAL | Status: DC

## 2014-02-27 NOTE — Telephone Encounter (Signed)
Don't see where we called this pt. He has been in hospital.

## 2014-02-27 NOTE — Telephone Encounter (Signed)
Pt missed CB. Please advise at [phone]

## 2014-02-27 NOTE — Patient Instructions (Signed)
Decrease Amiodarone to 200 mg daily  If weight is 260 lb or less only take 40 mg (2 tabs) of Torsemide  Your physician recommends that you schedule a follow-up appointment in: 1 month

## 2014-02-27 NOTE — Progress Notes (Signed)
Patient ID: Raymond [name], male   DOB: 1960-02-27, 53 y.o.   MRN: [id number]  PCP: [facility] Dr Katrinka Blazing  Primary Cardiologist: Dr. Antoine Poche Primary EP: Dr. 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   LHC in 2008 with normal cors. Myoview 05/2010: Very mild distal anterior and apical ischemia, EF 17% (low risk-medical therapy continued). Previously on Amiodarone and had very high TSH and was referred to endocrinology. He has had several admissions over the last 6 mos with recurrent atrial fibrillation with RVR and volume overload. He has undergone TEE-DCCV but had return of AFib. He was last admitted 12/21-12/24 with a/c systolic CHF in the setting of AFib with RVR and loss of BiV pacing. AV nodal ablation was recommended for control of refractory atrial fibrillation. He underwent this procedure with Dr. [name] 01/09/13. Amiodarone was discontinued at that time.   Admitted to [facility] with cardiogenic shock. Required dual pressors and discharged on milrinone at 0.375 mcg. He was diuresed with IV lasix and transitioned to torsemide 60 mg daily. Discharge weight was 275 pounds.    Follow up for Heart Failure: Overall feeling ok. His girl friend helps him with pills. He has limited ability to read due dyslexia. Weight at home trending down form 275 to 263 pounds. Denies SOB/PND/Orthopnea.Appetite fair. Eating high salt food such as ravioli.  Difficulty with transportation. Walks with a cane. Has [facility] for home milrinone. Lives alone.   ECHO 05/02/13 EF 10%   Labs 04/02/13 K 4.1 Creatinine 1.52 Pro BNP 2869 Labs 06/06/13 K 3.5, creatinine 1.51 Labs 08/27/13: K 3.8, creatinine 1.53, TSH 2.493, Free T4 1.33 Labs 02/22/2014: K 4.1 Creatinine 1.79   SH: Lives alone.  Mom drives him to appointments. Disabled.since 2010 . 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  Past Medical History  Diagnosis Date  . Non-ischemic cardiomyopathy     a. echo 11/10: EF 15%;   b. cath 10/08: normal cors;  c. Myoview 5/12: Very mild distal ant and apical isch, EF 17% (low risk-medical Rx cont'd);  d.  Echo 4/14:  EF 15%;  e. 05/2009 s/p SJM BiV ICD;  f. 07/2012 TEE: EF 20-25%, diff HK mild MR, mod TR.   Marland Kitchen Atrial fibrillation     a. on coumadin;  b. 07/2012 s/p TEE/DCCV. c. Recurrence 08/2012 in setting of abnormal thyroid panel.; 12/2012 AVN ablation by Dr [name]  . Hypertension   . Obesity   . Non-compliance   . Dyslexia     unable to read.  Marland Kitchen History of CVA (cerebrovascular accident) 11/2008    a. 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 RBBB (right bundle branch block)   . Cough syncope     a. During 08/2012 adm.  . Systolic CHF, chronic 01/19/1996  . Hypothyroidism 01/19/1996    s/p RAI therapy  . Stroke 01/19/2008  . Renal insufficiency     Current Outpatient Prescriptions  Medication Sig Dispense Refill  . acyclovir (ZOVIRAX) 800 MG tablet Take 400 mg by mouth 2 (two) times daily.     Marland Kitchen ALPRAZolam (XANAX) 0.25 MG tablet Take 1 tablet (0.25 mg total) by mouth at bedtime as needed for anxiety. 30  tablet 2  . amiodarone (PACERONE) 200 MG tablet Take 1 tablet (200 mg total) by mouth 2 (two) times daily. 60 tablet 6  . cyclobenzaprine (FLEXERIL) 10 MG tablet Take 1 tablet (10 mg total) by mouth 2 (two) times daily. 20 tablet 0  . HYDROcodone-acetaminophen (NORCO/VICODIN) 5-325 MG per tablet TAKE 1 TABLET BY MOUTH EVERY 8 HOURS AS NEEDED (Patient taking differently: TAKE 1 TABLET BY MOUTH EVERY 8 HOURS AS NEEDED FOR PAIN) 15 tablet 0  . levothyroxine (SYNTHROID, LEVOTHROID) 125 MCG tablet Take 250 mcg by mouth daily before breakfast.    . metolazone (ZAROXOLYN) 2.5 MG tablet Take 1 tablet (2.5 mg total) by mouth as needed. 15 tablet 3  . milrinone (PRIMACOR)  20 MG/100ML SOLN infusion Inject 48.45 mcg/min into the vein continuous. 100 mL   . spironolactone (ALDACTONE) 25 MG tablet Take 1 tablet (25 mg total) by mouth daily. 90 tablet 3  . torsemide (DEMADEX) 20 MG tablet Take 3 tablets (60 mg total) by mouth daily. 90 tablet 3  . warfarin (COUMADIN) 7.5 MG tablet Take 1 tablet daily 40 tablet 3   No current facility-administered medications for this encounter.    Filed Vitals:   02/27/14 0949  BP: 108/58  Pulse: 68  Weight: 263 lb 8 oz (119.523 kg)  SpO2: 97%    PHYSICAL EXAM: General:  Chronically ill appearing. No resp difficulty; mom present. Ambulated in clinic with a cane.  HEENT: normal Neck: supple. JVP 5-6 Carotids 2+ bilaterally; no bruits. No lymphadenopathy or thryomegaly appreciated. Cor: PMI normal. Regular rate & rhythm. No rubs, gallops or murmurs. zHas power PICC on R with milrinone infusing.  Lungs: clear Abdomen: Obese; soft, nontender, non distended. No hepatosplenomegaly. No bruits or masses. Good bowel sounds. Extremities: no cyanosis, clubbing, rash, no edema Neuro: alert & orientedx3, cranial nerves grossly intact.    ASSESSMENT & PLAN:  1) Chronic systolic HF: NICM s/p ST Jude CRT-D, ECHO EF 10% (04/2013) NYHA IIID on home milrinone at 0.375 mcg.  Continue [facility] with weekly BMET/  Volume status stable but weight trending down . I have asked him to cut back torsemide to 40 mg daily for weight at less than 261 pounds . Continue current dose of spiro.  No BB or Ace for now.  Reinforced dialy weights, low salt food choices, and limiting fluid intake to < 2 liters per day.  2) Afib- S/P AVN abalation by Dr [name] with most recent in December 2014.    Cut back amiodarone to 200 mg daily. On Coumadin. No bleeding problems.  Followed at [facility] coumadin clinic. 3) CKD, stage III-  Weekly BMET  4) HTN- Stable. Continue current medications.  5) Noncompliance- seems to be doing with diet and medications.  6) Dyslexic-  Can  not read. Will need to keep medication as simple as possible.    Follow up in 4 weeks.   Kauri Garson NP-C 10:24 AM

## 2014-03-01 ENCOUNTER — Inpatient Hospital Stay (HOSPITAL_COMMUNITY): Admit: 2014-03-01 | Payer: Medicare Other

## 2014-03-04 ENCOUNTER — Other Ambulatory Visit: Payer: Self-pay | Admitting: Endocrinology

## 2014-03-04 ENCOUNTER — Encounter (HOSPITAL_COMMUNITY): Payer: Medicare Other

## 2014-03-05 ENCOUNTER — Telehealth (HOSPITAL_COMMUNITY): Payer: Self-pay | Admitting: Vascular Surgery

## 2014-03-05 MED ORDER — LEVOTHYROXINE SODIUM 125 MCG PO TABS: 250.0000 ug | ORAL_TABLET | Freq: Every day | ORAL | Status: DC

## 2014-03-05 NOTE — Telephone Encounter (Signed)
Pt needs a refill Levthryoxine 125 mcg

## 2014-03-05 NOTE — Telephone Encounter (Signed)
Denied, pt should get refilled from provider that prescribes, ? Dr.[facility] Pt states he spoke with PCP office and med was denied Per Dr.Bensimhon ok refill x 1 until pt can get issue addressed with PCP Pt aware and voiced understanding

## 2014-03-06 ENCOUNTER — Telehealth: Payer: Self-pay | Admitting: Internal Medicine

## 2014-03-06 NOTE — Telephone Encounter (Signed)
New Message        Pharmacy calling stating that they are needing to change the brand of the medication for pt's rx refill. Please call back and advise.

## 2014-03-06 NOTE — Telephone Encounter (Signed)
Pharmacy called with FYI regarding levothyroxine Br[employer] have changed from [facility] to [facility]

## 2014-03-08 ENCOUNTER — Telehealth (HOSPITAL_COMMUNITY): Payer: Self-pay | Admitting: Vascular Surgery

## 2014-03-08 ENCOUNTER — Ambulatory Visit (INDEPENDENT_AMBULATORY_CARE_PROVIDER_SITE_OTHER): Payer: Medicare Other | Admitting: Cardiology

## 2014-03-08 DIAGNOSIS — Z5181 Encounter for therapeutic drug level monitoring: Secondary | ICD-10-CM

## 2014-03-08 DIAGNOSIS — I482 Chronic atrial fibrillation, unspecified: Secondary | ICD-10-CM

## 2014-03-08 LAB — POCT INR: INR: 1.6

## 2014-03-08 NOTE — Telephone Encounter (Signed)
Patient states this is the last day an aid is coming to his home for help in bathing.  Unsure if our office initiated this order.  Will review and call patient to see if we can continue this order.  Ave Filter

## 2014-03-08 NOTE — Telephone Encounter (Signed)
Pt mother caleed she wants to talk to someone about his home healthcare

## 2014-03-11 ENCOUNTER — Telehealth (HOSPITAL_COMMUNITY): Payer: Self-pay | Admitting: Vascular Surgery

## 2014-03-11 DIAGNOSIS — I5022 Chronic systolic (congestive) heart failure: Secondary | ICD-10-CM

## 2014-03-11 NOTE — Telephone Encounter (Signed)
Pt called his home health is expired, he needs a order sent to home health to continue.Marland Kitchen Please advise

## 2014-03-11 NOTE — Telephone Encounter (Signed)
Have been in contact with [facility] to see about getting aide restarted

## 2014-03-12 NOTE — Telephone Encounter (Signed)
New order has been placed and [facility] is aware, pt is also aware

## 2014-03-15 ENCOUNTER — Telehealth (HOSPITAL_COMMUNITY): Payer: Self-pay | Admitting: Vascular Surgery

## 2014-03-15 NOTE — Telephone Encounter (Signed)
Left VM on Raymond [name]'s ID VM ok to for additional OT

## 2014-03-15 NOTE — Telephone Encounter (Signed)
Occupational therapist [facility] called they would like Additional occupational order for bathing safety .Marland Kitchen Please advise

## 2014-03-18 ENCOUNTER — Ambulatory Visit (INDEPENDENT_AMBULATORY_CARE_PROVIDER_SITE_OTHER): Payer: Medicare Other | Admitting: Internal Medicine

## 2014-03-18 DIAGNOSIS — I4891 Unspecified atrial fibrillation: Secondary | ICD-10-CM

## 2014-03-18 DIAGNOSIS — Z5181 Encounter for therapeutic drug level monitoring: Secondary | ICD-10-CM

## 2014-03-18 LAB — POCT INR: INR: 1.5

## 2014-03-21 ENCOUNTER — Telehealth (HOSPITAL_COMMUNITY): Payer: Self-pay | Admitting: Vascular Surgery

## 2014-03-21 NOTE — Telephone Encounter (Signed)
Pt needs a order to get [facility] to give him a bath

## 2014-03-22 NOTE — Telephone Encounter (Signed)
Pt aware he no longer qualifies for an aide at this time, he states then he wished to have line and med stopped so that he can shower, advised pt can shower with line they just have to wrap it up but he states at appt next week he would like med stopped, pt is sch to see [place] on Wed 3/9, will address at that time, he is ok to wait until then

## 2014-03-27 ENCOUNTER — Ambulatory Visit: Payer: Medicare Other | Admitting: Internal Medicine

## 2014-03-27 ENCOUNTER — Telehealth (HOSPITAL_COMMUNITY): Payer: Self-pay | Admitting: Vascular Surgery

## 2014-03-27 ENCOUNTER — Encounter (HOSPITAL_COMMUNITY): Payer: Self-pay

## 2014-03-27 ENCOUNTER — Ambulatory Visit (INDEPENDENT_AMBULATORY_CARE_PROVIDER_SITE_OTHER): Payer: Medicare Other | Admitting: Internal Medicine

## 2014-03-27 ENCOUNTER — Ambulatory Visit (HOSPITAL_BASED_OUTPATIENT_CLINIC_OR_DEPARTMENT_OTHER)
Admission: RE | Admit: 2014-03-27 | Discharge: 2014-03-27 | Disposition: A | Discharge status: Home / Self Care | Payer: Medicare Other | Source: Ambulatory Visit | Attending: Internal Medicine | Admitting: Internal Medicine

## 2014-03-27 VITALS — BP 144/78 | HR 95 | Resp 20 | Wt 271.0 lb

## 2014-03-27 DIAGNOSIS — N183 Chronic kidney disease, stage 3 unspecified: Secondary | ICD-10-CM

## 2014-03-27 DIAGNOSIS — I10 Essential (primary) hypertension: Secondary | ICD-10-CM

## 2014-03-27 DIAGNOSIS — R0602 Shortness of breath: Secondary | ICD-10-CM

## 2014-03-27 DIAGNOSIS — I4891 Unspecified atrial fibrillation: Secondary | ICD-10-CM

## 2014-03-27 DIAGNOSIS — I472 Ventricular tachycardia: Secondary | ICD-10-CM | POA: Diagnosis not present

## 2014-03-27 DIAGNOSIS — R55 Syncope and collapse: Secondary | ICD-10-CM | POA: Diagnosis not present

## 2014-03-27 DIAGNOSIS — R480 Dyslexia and alexia: Secondary | ICD-10-CM | POA: Insufficient documentation

## 2014-03-27 DIAGNOSIS — I5022 Chronic systolic (congestive) heart failure: Secondary | ICD-10-CM | POA: Insufficient documentation

## 2014-03-27 DIAGNOSIS — Z9119 Patient's noncompliance with other medical treatment and regimen: Secondary | ICD-10-CM | POA: Insufficient documentation

## 2014-03-27 DIAGNOSIS — I5023 Acute on chronic systolic (congestive) heart failure: Secondary | ICD-10-CM

## 2014-03-27 DIAGNOSIS — I428 Other cardiomyopathies: Secondary | ICD-10-CM

## 2014-03-27 DIAGNOSIS — I429 Cardiomyopathy, unspecified: Secondary | ICD-10-CM

## 2014-03-27 DIAGNOSIS — Z5181 Encounter for therapeutic drug level monitoring: Secondary | ICD-10-CM

## 2014-03-27 LAB — POCT INR: INR: 2

## 2014-03-27 MED ORDER — LEVOTHYROXINE SODIUM 125 MCG PO TABS: 250.0000 ug | ORAL_TABLET | Freq: Every day | ORAL | Status: AC

## 2014-03-27 MED ORDER — TORSEMIDE 20 MG PO TABS: 40.0000 mg | ORAL_TABLET | Freq: Two times a day (BID) | ORAL | Status: DC

## 2014-03-27 NOTE — Telephone Encounter (Signed)
Nurse called pt, FYI PT is coming in today, PT wants PICC line out and milronone stopped he believes its not helping him.. Please advise

## 2014-03-27 NOTE — Telephone Encounter (Signed)
Pharmacy at [facility] called they received the order for the Iv LASIX, The nurse case manger would like to speak to someone about the issue of pt wanting  stopping his milronone and removing PICC line.. Please advise

## 2014-03-27 NOTE — Patient Instructions (Signed)
CHANGE Torsemide to 40 mg, one tab twice a day  [facility] will be at your home to administer 80 mg IV Lasix x 1  Your physician recommends that you schedule a follow-up appointment in: 1 week

## 2014-03-27 NOTE — Progress Notes (Signed)
Patient ID: Raymond [name], male   DOB: Apr 18, 1960, 54 y.o.   MRN: [id number] PCP: [facility] Dr Katrinka Blazing  Primary Cardiologist: Dr. Antoine Poche Primary EP: Dr. 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   LHC in 2008 with normal cors. Myoview 05/2010: Very mild distal anterior and apical ischemia, EF 17% (low risk-medical therapy continued). Previously on Amiodarone and had very high TSH and was referred to endocrinology. He has had several admissions over the last 6 mos with recurrent atrial fibrillation with RVR and volume overload. He has undergone TEE-DCCV but had return of AFib. He was last admitted 12/21-12/24 with a/c systolic CHF in the setting of AFib with RVR and loss of BiV pacing. AV nodal ablation was recommended for control of refractory atrial fibrillation. He underwent this procedure with Dr. [name] 01/09/13. Amiodarone was discontinued at that time.   Admitted to [facility] with cardiogenic shock. Required dual pressors and discharged on milrinone at 0.375 mcg. He was diuresed with IV lasix and transitioned to torsemide 60 mg daily. Discharge weight was 275 pounds.    Follow up for Heart Failure: Complaining of fatigue. Weight at home trending up from 263- to 271 pounds. SOB with exertion. Sleeping on increased pillows. +Orthopnea. Coughing at night.  Eating high salt food such as big mac and quarter pounder in 2 days. Difficulty with transportation. Walks with a cane. Has [facility] for home milrinone. Lives alone. Has difficulty with LUE movement.   ECHO 05/02/13 EF 10%   Labs 04/02/13 K 4.1 Creatinine 1.52 Pro BNP 2869 Labs 06/06/13 K 3.5, creatinine 1.51 Labs 08/27/13: K 3.8, creatinine 1.53, TSH 2.493, Free T4 1.33 Labs 02/22/2014: K 4.1 Creatinine 1.79   SH: Lives alone.  Mom drives him to appointments. Disabled.since 2010 . 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  Past Medical History  Diagnosis Date  . Non-ischemic cardiomyopathy     a. echo 11/10: EF 15%;   b. cath 10/08: normal cors;  c. Myoview 5/12: Very mild distal ant and apical isch, EF 17% (low risk-medical Rx cont'd);  d.  Echo 4/14:  EF 15%;  e. 05/2009 s/p SJM BiV ICD;  f. 07/2012 TEE: EF 20-25%, diff HK mild MR, mod TR.   Marland Kitchen Atrial fibrillation     a. on coumadin;  b. 07/2012 s/p TEE/DCCV. c. Recurrence 08/2012 in setting of abnormal thyroid panel.; 12/2012 AVN ablation by Dr [name]  . Hypertension   . Obesity   . Non-compliance   . Dyslexia     unable to read.  Marland Kitchen History of CVA (cerebrovascular accident) 11/2008    a. 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 RBBB (right bundle branch block)   . Cough syncope     a. During 08/2012 adm.  . Systolic CHF, chronic 01/19/1996  . Hypothyroidism 01/19/1996    s/p RAI therapy  . Stroke 01/19/2008  . Renal insufficiency     Current Outpatient Prescriptions  Medication Sig Dispense Refill  . acyclovir (ZOVIRAX) 800 MG tablet Take 400 mg by mouth 2 (two) times daily.     Marland Kitchen ALPRAZolam (XANAX) 0.25 MG tablet Take 1 tablet (0.25 mg total) by  mouth at bedtime as needed for anxiety. 30 tablet 2  . amiodarone (PACERONE) 200 MG tablet Take 1 tablet (200 mg total) by mouth daily. 60 tablet 6  . cyclobenzaprine (FLEXERIL) 10 MG tablet Take 1 tablet (10 mg total) by mouth 2 (two) times daily. 20 tablet 0  . HYDROcodone-acetaminophen (NORCO/VICODIN) 5-325 MG per tablet TAKE 1 TABLET BY MOUTH EVERY 8 HOURS AS NEEDED (Patient taking differently: TAKE 1 TABLET BY MOUTH EVERY 8 HOURS AS NEEDED FOR PAIN) 15 tablet 0  . levothyroxine (SYNTHROID, LEVOTHROID) 125 MCG tablet Take 2 tablets (250 mcg total) by mouth daily before breakfast. Additional refill should come from PCP 60 tablet 0  . metolazone (ZAROXOLYN) 2.5 MG tablet  Take 2.5 mg by mouth daily as needed.    . milrinone (PRIMACOR) 20 MG/100ML SOLN infusion Inject 48.45 mcg/min into the vein continuous. 100 mL   . spironolactone (ALDACTONE) 25 MG tablet Take 1 tablet (25 mg total) by mouth daily. 90 tablet 3  . torsemide (DEMADEX) 20 MG tablet Take 3 tablets (60 mg total) by mouth daily. If weight is 260 lb or less only take 40 mg 90 tablet 3  . warfarin (COUMADIN) 7.5 MG tablet Take 1 tablet daily 40 tablet 3   No current facility-administered medications for this encounter.    Filed Vitals:   03/27/14 0950  BP: 144/78  Pulse: 95  Resp: 20  Weight: 271 lb (122.925 kg)  SpO2: 94%    PHYSICAL EXAM: General:  Chronically ill appearing. SOB with exertion. Mom present. Arrived in wheelchair.   HEENT: normal Neck: supple. JVP to jaw. Carotids 2+ bilaterally; no bruits. No lymphadenopathy or thryomegaly appreciated. Cor: PMI normal. Regular rate & rhythm. No rubs, gallops or murmurs. Has power PICC on R with milrinone infusing.  Lungs: clear Abdomen: Obese; soft, nontender, non distended. No hepatosplenomegaly. No bruits or masses. Good bowel sounds. Extremities: no cyanosis, clubbing, rash, R and LLE 1+ edema Neuro: alert & orientedx3, cranial nerves grossly intact.    ASSESSMENT & PLAN: 1) Chronic systolic HF: NICM s/p ST Jude CRT-D, ECHO EF 10% (04/2013) NYHA IIID-IV on home milrinone at 0.375 mcg.   Volume status elevated due to high salt diet.  Will ask [facility] to give 80 mg IV lasix and initiate the diuretic protocol. Check BMET tomorrow.   Increase torsemide to 40 mg twice a day + 25 mg spironolactone daily.  No BB or Ace for now.  Reinforced dialy weights, low salt food choices, and limiting fluid intake to < 2 liters per day.  2) Afib- S/P AVN abalation by Dr [name] with most recent in December 2014.   Cut back amiodarone to 200 mg daily. Check  TSH next lab draw. On Coumadin. No bleeding problems.  Followed at [facility] coumadin clinic. 3) CKD, stage  III-  Weekly BMET  4) HTN- Stable. Continue current medications.  5) Noncompliance- unfortunate situation he has limiting funds to follow low salt diet. Will ask SW to see if she can help. Marland Kitchen  6) Dyslexic-  Can not read. Will need to keep medication as simple as possible.    Follow up next week to reassess volume status.  CLEGG,AMY NP-C 10:03 AM

## 2014-03-28 ENCOUNTER — Emergency Department (HOSPITAL_COMMUNITY)
Admission: EM | Admit: 2014-03-28 | Discharge: 2014-03-28 | Disposition: A | Discharge status: Home / Self Care | Payer: Medicare Other | Source: Home / Self Care | Attending: Emergency Medicine | Admitting: Emergency Medicine

## 2014-03-28 ENCOUNTER — Encounter (HOSPITAL_COMMUNITY): Payer: Self-pay

## 2014-03-28 ENCOUNTER — Emergency Department (HOSPITAL_COMMUNITY): Payer: Medicare Other

## 2014-03-28 ENCOUNTER — Telehealth (HOSPITAL_COMMUNITY): Payer: Self-pay

## 2014-03-28 DIAGNOSIS — E669 Obesity, unspecified: Secondary | ICD-10-CM

## 2014-03-28 DIAGNOSIS — R609 Edema, unspecified: Secondary | ICD-10-CM | POA: Insufficient documentation

## 2014-03-28 DIAGNOSIS — I10 Essential (primary) hypertension: Secondary | ICD-10-CM

## 2014-03-28 DIAGNOSIS — E039 Hypothyroidism, unspecified: Secondary | ICD-10-CM

## 2014-03-28 DIAGNOSIS — I5022 Chronic systolic (congestive) heart failure: Secondary | ICD-10-CM

## 2014-03-28 DIAGNOSIS — Z7901 Long term (current) use of anticoagulants: Secondary | ICD-10-CM | POA: Insufficient documentation

## 2014-03-28 DIAGNOSIS — I509 Heart failure, unspecified: Secondary | ICD-10-CM

## 2014-03-28 DIAGNOSIS — Z8673 Personal history of transient ischemic attack (TIA), and cerebral infarction without residual deficits: Secondary | ICD-10-CM

## 2014-03-28 DIAGNOSIS — Z79899 Other long term (current) drug therapy: Secondary | ICD-10-CM | POA: Insufficient documentation

## 2014-03-28 LAB — BASIC METABOLIC PANEL
Anion gap: 11 (ref 5–15)
BUN: 13 mg/dL (ref 6–23)
CHLORIDE: 100 mmol/L (ref 96–112)
CO2: 28 mmol/L (ref 19–32)
CREATININE: 1.62 mg/dL — AB (ref 0.50–1.35)
Calcium: 9.2 mg/dL (ref 8.4–10.5)
GFR calc Af Amer: 55 mL/min — ABNORMAL LOW (ref >90)
GFR calc non Af Amer: 48 mL/min — ABNORMAL LOW (ref >90)
GLUCOSE: 121 mg/dL — AB (ref 70–99)
POTASSIUM: 3.2 mmol/L — AB (ref 3.5–5.1)
Sodium: 139 mmol/L (ref 135–145)

## 2014-03-28 LAB — PROTIME-INR
INR: 1.9 — ABNORMAL HIGH (ref 0.00–1.49)
Prothrombin Time: 21.9 seconds — ABNORMAL HIGH (ref 11.6–15.2)

## 2014-03-28 LAB — CBC
HEMATOCRIT: 40.9 % (ref 39.0–52.0)
Hemoglobin: 13.3 g/dL (ref 13.0–17.0)
MCH: 30.2 pg (ref 26.0–34.0)
MCHC: 32.5 g/dL (ref 30.0–36.0)
MCV: 92.7 fL (ref 78.0–100.0)
Platelets: 269 10*3/uL (ref 150–400)
RBC: 4.41 MIL/uL (ref 4.22–5.81)
RDW: 15.6 % — AB (ref 11.5–15.5)
WBC: 7.1 10*3/uL (ref 4.0–10.5)

## 2014-03-28 LAB — BRAIN NATRIURETIC PEPTIDE: B Natriuretic Peptide: 590.5 pg/mL — ABNORMAL HIGH (ref 0.0–100.0)

## 2014-03-28 LAB — I-STAT TROPONIN, ED
Troponin i, poc: 0.1 ng/mL (ref 0.00–0.08)
Troponin i, poc: 0.12 ng/mL (ref 0.00–0.08)

## 2014-03-28 MED ORDER — ZAROXOLYN 2.5 MG PO TABS: 2.5000 mg | ORAL_TABLET | Freq: Every day | ORAL | Status: DC

## 2014-03-28 MED ORDER — POTASSIUM CHLORIDE CRYS ER 20 MEQ PO TBCR
30.0000 meq | EXTENDED_RELEASE_TABLET | ORAL | Status: AC
Start: 2014-03-28 — End: 2014-03-28
  Administered 2014-03-28 (×2): 30 meq via ORAL
  Filled 2014-03-28 (×2): qty 2

## 2014-03-28 MED ORDER — TORSEMIDE 20 MG PO TABS: 60.0000 mg | ORAL_TABLET | Freq: Two times a day (BID) | ORAL | Status: DC

## 2014-03-28 MED ORDER — POTASSIUM CHLORIDE ER 10 MEQ PO TBCR
40.0000 meq | EXTENDED_RELEASE_TABLET | Freq: Every day | ORAL | Status: DC
Start: 2014-03-28 — End: 2014-04-09

## 2014-03-28 MED ORDER — ASPIRIN 81 MG PO CHEW
324.0000 mg | CHEWABLE_TABLET | Freq: Once | ORAL | Status: AC
  Administered 2014-03-28: 324 mg via ORAL
  Filled 2014-03-28: qty 4

## 2014-03-28 MED ORDER — FUROSEMIDE 10 MG/ML IJ SOLN
120.0000 mg | Freq: Once | INTRAMUSCULAR | Status: AC
  Administered 2014-03-28: 120 mg via INTRAVENOUS
  Filled 2014-03-28: qty 12

## 2014-03-28 NOTE — ED Notes (Signed)
Pt refused continuous BP monitor.

## 2014-03-28 NOTE — Telephone Encounter (Signed)
Addressed phone call with Amy Clegg NP-C concerning patient's weight gain of 2 lb overnight.  Patient tends to be noncompliant with medications/diet/fluid restrictions.  Was not SOB on phone, and [facility] to come to home today to recheck patient and do lab work.  Will wait to hear from them for further information.  Ave Filter

## 2014-03-28 NOTE — Discharge Instructions (Signed)
Please follow up at your appointment  Please increase your torsemide to 60 mg twice daily.  Please take 2.5 mg of metolazone tomorrow morning once.  Please start taking potassium 40 daily  Heart Failure Heart failure is a condition in which the heart has trouble pumping blood. This means your heart does not pump blood efficiently for your body to work well. In some cases of heart failure, fluid may back up into your lungs or you may have swelling (edema) in your lower legs. Heart failure is usually a long-term (chronic) condition. It is important for you to take good care of yourself and follow your health care provider's treatment plan. CAUSES  Some health conditions can cause heart failure. Those health conditions include:  High blood pressure (hypertension). Hypertension causes the heart muscle to work harder than normal. When pressure in the blood vessels is high, the heart needs to pump (contract) with more force in order to circulate blood throughout the body. High blood pressure eventually causes the heart to become stiff and weak.  Coronary artery disease (CAD). CAD is the buildup of cholesterol and fat (plaque) in the arteries of the heart. The blockage in the arteries deprives the heart muscle of oxygen and blood. This can cause chest pain and may lead to a heart attack. High blood pressure can also contribute to CAD.  Heart attack (myocardial infarction). A heart attack occurs when one or more arteries in the heart become blocked. The loss of oxygen damages the muscle tissue of the heart. When this happens, part of the heart muscle dies. The injured tissue does not contract as well and weakens the heart's ability to pump blood.  Abnormal heart valves. When the heart valves do not open and close properly, it can cause heart failure. This makes the heart muscle pump harder to keep the blood flowing.  Heart muscle disease (cardiomyopathy or myocarditis). Heart muscle disease is damage to the  heart muscle from a variety of causes. These can include drug or alcohol abuse, infections, or unknown reasons. These can increase the risk of heart failure.  Lung disease. Lung disease makes the heart work harder because the lungs do not work properly. This can cause a strain on the heart, leading it to fail.  Diabetes. Diabetes increases the risk of heart failure. High blood sugar contributes to high fat (lipid) levels in the blood. Diabetes can also cause slow damage to tiny blood vessels that carry important nutrients to the heart muscle. When the heart does not get enough oxygen and food, it can cause the heart to become weak and stiff. This leads to a heart that does not contract efficiently.  Other conditions can contribute to heart failure. These include abnormal heart rhythms, thyroid problems, and low blood counts (anemia). Certain unhealthy behaviors can increase the risk of heart failure, including:  Being overweight.  Smoking or chewing tobacco.  Eating foods high in fat and cholesterol.  Abusing illicit drugs or alcohol.  Lacking physical activity. SYMPTOMS  Heart failure symptoms may vary and can be hard to detect. Symptoms may include:  Shortness of breath with activity, such as climbing stairs.  Persistent cough.  Swelling of the feet, ankles, legs, or abdomen.  Unexplained weight gain.  Difficulty breathing when lying flat (orthopnea).  Waking from sleep because of the need to sit up and get more air.  Rapid heartbeat.  Fatigue and loss of energy.  Feeling light-headed, dizzy, or close to fainting.  Loss of appetite.  Nausea.  Increased urination during the night (nocturia). DIAGNOSIS  A diagnosis of heart failure is based on your history, symptoms, physical examination, and diagnostic tests. Diagnostic tests for heart failure may include:  Echocardiography.  Electrocardiography.  Chest [place].  Blood tests.  Exercise stress test.  Cardiac  angiography.  Radionuclide scans. TREATMENT  Treatment is aimed at managing the symptoms of heart failure. Medicines, behavioral changes, or surgical intervention may be necessary to treat heart failure.  Medicines to help treat heart failure may include:  Angiotensin-converting enzyme (ACE) inhibitors. This type of medicine blocks the effects of a blood protein called angiotensin-converting enzyme. ACE inhibitors relax (dilate) the blood vessels and help lower blood pressure.  Angiotensin receptor blockers (ARBs). This type of medicine blocks the actions of a blood protein called angiotensin. Angiotensin receptor blockers dilate the blood vessels and help lower blood pressure.  Water pills (diuretics). Diuretics cause the kidneys to remove salt and water from the blood. The extra fluid is removed through urination. This loss of extra fluid lowers the volume of blood the heart pumps.  Beta blockers. These prevent the heart from beating too fast and improve heart muscle strength.  Digitalis. This increases the force of the heartbeat.  Healthy behavior changes include:  Obtaining and maintaining a healthy weight.  Stopping smoking or chewing tobacco.  Eating heart-healthy foods.  Limiting or avoiding alcohol.  Stopping illicit drug use.  Physical activity as directed by your health care provider.  Surgical treatment for heart failure may include:  A procedure to open blocked arteries, repair damaged heart valves, or remove damaged heart muscle tissue.  A pacemaker to improve heart muscle function and control certain abnormal heart rhythms.  An internal cardioverter defibrillator to treat certain serious abnormal heart rhythms.  A left ventricular assist device (LVAD) to assist the pumping ability of the heart. HOME CARE INSTRUCTIONS   Take medicines only as directed by your health care provider. Medicines are important in reducing the workload of your heart, slowing the  progression of heart failure, and improving your symptoms.  Do not stop taking your medicine unless directed by your health care provider.  Do not skip any dose of medicine.  Refill your prescriptions before you run out of medicine. Your medicines are needed every day.  Engage in moderate physical activity if directed by your health care provider. Moderate physical activity can benefit some people. The elderly and people with severe heart failure should consult with a health care provider for physical activity recommendations.  Eat heart-healthy foods. Food choices should be free of trans fat and low in saturated fat, cholesterol, and salt (sodium). Healthy choices include fresh or frozen fruits and vegetables, fish, lean meats, legumes, fat-free or low-fat dairy products, and whole grain or high fiber foods. Talk to a dietitian to learn more about heart-healthy foods.  Limit sodium if directed by your health care provider. Sodium restriction may reduce symptoms of heart failure in some people. Talk to a dietitian to learn more about heart-healthy seasonings.  Use healthy cooking methods. Healthy cooking methods include roasting, grilling, broiling, baking, poaching, steaming, or stir-frying. Talk to a dietitian to learn more about healthy cooking methods.  Limit fluids if directed by your health care provider. Fluid restriction may reduce symptoms of heart failure in some people.  Weigh yourself every day. Daily weights are important in the early recognition of excess fluid. You should weigh yourself every morning after you urinate and before you eat breakfast. Wear the same  amount of clothing each time you weigh yourself. Record your daily weight. Provide your health care provider with your weight record.  Monitor and record your blood pressure if directed by your health care provider.  Check your pulse if directed by your health care provider.  Lose weight if directed by your health care  provider. Weight loss may reduce symptoms of heart failure in some people.  Stop smoking or chewing tobacco. Nicotine makes your heart work harder by causing your blood vessels to constrict. Do not use nicotine gum or patches before talking to your health care provider.  Keep all follow-up visits as directed by your health care provider. This is important.  Limit alcohol intake to no more than 1 drink per day for nonpregnant women and 2 drinks per day for men. One drink equals 12 ounces of beer, 5 ounces of wine, or 1 ounces of hard liquor. Drinking more than that is harmful to your heart. Tell your health care provider if you drink alcohol several times a week. Talk with your health care provider about whether alcohol is safe for you. If your heart has already been damaged by alcohol or you have severe heart failure, drinking alcohol should be stopped completely.  Stop illicit drug use.  Stay up-to-date with immunizations. It is especially important to prevent respiratory infections through current pneumococcal and influenza immunizations.  Manage other health conditions such as hypertension, diabetes, thyroid disease, or abnormal heart rhythms as directed by your health care provider.  Learn to manage stress.  Plan rest periods when fatigued.  Learn strategies to manage high temperatures. If the weather is extremely hot:  Avoid vigorous physical activity.  Use air conditioning or fans or seek a cooler location.  Avoid caffeine and alcohol.  Wear loose-fitting, lightweight, and light-colored clothing.  Learn strategies to manage cold temperatures. If the weather is extremely cold:  Avoid vigorous physical activity.  Layer clothes.  Wear mittens or gloves, a hat, and a scarf when going outside.  Avoid alcohol.  Obtain ongoing education and support as needed.  Participate in or seek rehabilitation as needed to maintain or improve independence and quality of life. SEEK MEDICAL  CARE IF:   Your weight increases by 03 lb/1.4 kg in 1 day or 05 lb/2.3 kg in a week.  You have increasing shortness of breath that is unusual for you.  You are unable to participate in your usual physical activities.  You tire easily.  You cough more than normal, especially with physical activity.  You have any or more swelling in areas such as your hands, feet, ankles, or abdomen.  You are unable to sleep because it is hard to breathe.  You feel like your heart is beating fast (palpitations).  You become dizzy or light-headed upon standing up. SEEK IMMEDIATE MEDICAL CARE IF:   You have difficulty breathing.  There is a change in mental status such as decreased alertness or difficulty with concentration.  You have a pain or discomfort in your chest.  You have an episode of fainting (syncope). MAKE SURE YOU:   Understand these instructions.  Will watch your condition.  Will get help right away if you are not doing well or get worse. Document Released: 01/04/2005 Document Revised: 05/21/2013 Document Reviewed: 02/04/2012 [facility] Patient Information 2015 [place], [place]. This information is not intended to replace advice given to you by your health care provider. Make sure you discuss any questions you have with your health care provider.

## 2014-03-28 NOTE — Telephone Encounter (Signed)
Patient called c/o having a "rough night" last night.  Patient received 80mg  IV lasix yesterday after our clinic visit, was up using the bathroom a lot, but gained 2 lbs this morning.  Also said he coughed and choked a lot laying down last night and had to use an extra pillow.  [facility] RN is supposed to be coming by his home in the next hour for follow up visit.  Will forward to providers to see what else they recommend for patient.  Ave Filter

## 2014-03-28 NOTE — ED Provider Notes (Signed)
CSN: [id number]     Arrival date & time 03/28/14  1752 History   First MD Initiated Contact with Patient 03/28/14 1902     Chief Complaint  Patient presents with  . Shortness of Breath  . Edema     (Consider location/radiation/quality/duration/timing/severity/associated sxs/prior Treatment) HPI  This is a patient w/ advance heart failure, on milrinone, who comes in w/ orthopnea, SOB, weight gain.  Patient has had gradually increasing SOB over the past month a/w 10 lb weight gain.  Patient is on milrinone gtt through picc line at home.  No CP.  Patient seen y where he was given a one time dose of lasix IV through picc but still had 2lb weight gain overnight so he presents now.  Past Medical History  Diagnosis Date  . Non-ischemic cardiomyopathy     a. echo 11/10: EF 15%;   b. cath 10/08: normal cors;  c. Myoview 5/12: Very mild distal ant and apical isch, EF 17% (low risk-medical Rx cont'd);  d.  Echo 4/14:  EF 15%;  e. 05/2009 s/p SJM BiV ICD;  f. 07/2012 TEE: EF 20-25%, diff HK mild MR, mod TR.   Marland Kitchen Atrial fibrillation     a. on coumadin;  b. 07/2012 s/p TEE/DCCV. c. Recurrence 08/2012 in setting of abnormal thyroid panel.; 12/2012 AVN ablation by Dr [name]  . Hypertension   . Obesity   . Non-compliance   . Dyslexia     unable to read.  Marland Kitchen History of CVA (cerebrovascular accident) 11/2008    a. 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 RBBB (right bundle branch block)   . Cough syncope     a. During 08/2012 adm.  . Systolic CHF, chronic 01/19/1996  . Hypothyroidism 01/19/1996    s/p RAI therapy  . Stroke 01/19/2008  . Renal insufficiency    Past Surgical History  Procedure Laterality Date  . Bi-ventricular implantable cardioverter defibrillator  (crt-d)  06/11/09    [employer] [facility] [id number] BIVENTRICULAR AICD SERIAL #[id number]  . Knee arthroscopy Left ~ 1995  . Cardiac catheterization      "several time" (09/01/2012)  . Tee without cardioversion N/A 06/23/2012   Procedure: TRANSESOPHAGEAL ECHOCARDIOGRAM (TEE);  Surgeon: Vesta Mixer, MD;  Location: [facility] ENDOSCOPY;  Service: Cardiovascular;  Laterality: N/A;  TEE will be done at 0730   . Cardioversion N/A 07/27/2012    Procedure: CARDIOVERSION;  Surgeon: Vesta Mixer, MD;  Location: [facility] ENDOSCOPY;  Service: Cardiovascular;  Laterality: N/A;  . Tee without cardioversion N/A 08/10/2012    Procedure: TRANSESOPHAGEAL ECHOCARDIOGRAM (TEE);  Surgeon: Wendall Stade, MD;  Location: [facility] ENDOSCOPY;  Service: Cardiovascular;  Laterality: N/A;  . Cardioversion N/A 08/10/2012    Procedure: CARDIOVERSION;  Surgeon: Wendall Stade, MD;  Location[facility] ENDOSCOPY;  Service: Cardiovascular;  Laterality: N/A;  . Sp pta add intra cran  11/2008    a. 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 Ablation  01/09/2013    AVN ablation by Dr [name]  . Av node ablation N/A 01/09/2013    Procedure: AV NODE ABLATION;  Surgeon: Marinus Maw, MD;  Location: [facility] CATH LAB;  Service: Cardiovascular;  Laterality: N/A;  . Right heart catheterization N/A 02/15/2014    Procedure: RIGHT HEART CATH;  Surgeon: Dolores Patty, MD;  Location: [facility] CATH LAB;  Service: Cardiovascular;  Laterality: N/A;   Family History  Problem Relation Age of Onset  . Heart  disease Father 36    AMI  . Diabetes Father   . Stroke Father 57    CVA x 2  . Diabetes Sister   . Atrial fibrillation Sister   . Hyperlipidemia Mother   . Hypertension Mother   . Heart disease Mother 33    CHF   History  Substance Use Topics  . Smoking status: Never Smoker   . Smokeless tobacco: Never Used  . Alcohol Use: 0.0 oz/week    0 Standard drinks or equivalent per week     Comment: Rare    Review of Systems  Constitutional: Negative for fever and chills.  Eyes: Negative for redness.  Respiratory: Positive for cough and shortness of breath.   Cardiovascular: Negative for chest pain.  Gastrointestinal: Negative for nausea, vomiting, abdominal  pain and diarrhea.  Genitourinary: Negative for dysuria.  Skin: Negative for rash.  Neurological: Negative for headaches.      Allergies  Valsartan  Home Medications   Prior to Admission medications   Medication Sig Start Date End Date Taking? Authorizing Provider  acyclovir (ZOVIRAX) 800 MG tablet Take 400 mg by mouth 2 (two) times daily.     Historical Provider, MD  ALPRAZolam Prudy Feeler) 0.25 MG tablet Take 1 tablet (0.25 mg total) by mouth at bedtime as needed for anxiety. 04/16/13   Ethelda Chick, MD  amiodarone (PACERONE) 200 MG tablet Take 1 tablet (200 mg total) by mouth daily. 02/27/14   Amy D Filbert Schilder, NP  carvedilol (COREG) 6.25 MG tablet Take 6.25 mg by mouth. 12/26/13   Historical Provider, MD  cyclobenzaprine (FLEXERIL) 10 MG tablet Take 1 tablet (10 mg total) by mouth 2 (two) times daily. 07/26/13   Gwenlyn Found Copland, MD  HYDROcodone-acetaminophen (NORCO/VICODIN) 5-325 MG per tablet TAKE 1 TABLET BY MOUTH EVERY 8 HOURS AS NEEDED Patient taking differently: TAKE 1 TABLET BY MOUTH EVERY 8 HOURS AS NEEDED FOR PAIN 07/30/13   Pearline Cables, MD  levothyroxine (SYNTHROID, LEVOTHROID) 125 MCG tablet Take 2 tablets (250 mcg total) by mouth daily before breakfast. Additional refill should come from PCP 03/27/14   Amy D Clegg, NP  losartan (COZAAR) 50 MG tablet Take 50 mg by mouth daily. 12/25/13   Historical Provider, MD  metolazone (ZAROXOLYN) 2.5 MG tablet Take 2.5 mg by mouth daily as needed.    Historical Provider, MD  milrinone (PRIMACOR) 20 MG/100ML SOLN infusion Inject 48.45 mcg/min into the vein continuous. 02/22/14   Amy Georgie Chard, NP  spironolactone (ALDACTONE) 25 MG tablet Take 1 tablet (25 mg total) by mouth daily. 08/29/13   Aundria Rud, NP  torsemide (DEMADEX) 20 MG tablet Take 2 tablets (40 mg total) by mouth 2 (two) times daily. 03/27/14   Amy D Filbert Schilder, NP  warfarin (COUMADIN) 7.5 MG tablet Take 1 tablet daily 02/22/14   Amy D Clegg, NP   BP 136/97 mmHg  Pulse 41  Temp(Src) 98.6  F (37 C) (Oral)  Resp 17  SpO2 96% Physical Exam  Constitutional: No distress.  HENT:  Head: Normocephalic and atraumatic.  Eyes: EOM are normal. Pupils are equal, round, and reactive to light.  Neck: Normal range of motion. Neck supple.  Cardiovascular: Normal rate.   Pulmonary/Chest: Effort normal. No respiratory distress. He has no wheezes. He has no rales.  Abdominal: Soft. There is no tenderness.  Musculoskeletal: He exhibits edema (bilateral 2+ pitting on the shinss).  Skin: No rash noted. He is not diaphoretic.    ED Course  Procedures (  including critical care time) Labs Review Labs Reviewed  CBC - Abnormal; Notable for the following:    RDW 15.6 (*)    All other components within normal limits  BASIC METABOLIC PANEL - Abnormal; Notable for the following:    Potassium 3.2 (*)    Glucose, Bld 121 (*)    Creatinine, Ser 1.62 (*)    GFR calc non Af Amer 48 (*)    GFR calc Af Amer 55 (*)    All other components within normal limits  BRAIN NATRIURETIC PEPTIDE - Abnormal; Notable for the following:    B Natriuretic Peptide 590.5 (*)    All other components within normal limits  I-STAT TROPOININ, ED - Abnormal; Notable for the following:    Troponin i, poc 0.10 (*)    All other components within normal limits  PROTIME-INR    Imaging Review No results found.   EKG Interpretation   Date/Time:  Thursday March 28 2014 18:39:55 EST Ventricular Rate:  81 PR Interval:    QRS Duration: 158 QT Interval:  476 QTC Calculation: 552 R Axis:   -100 Text Interpretation:  Ventricular-paced rhythm with occasional Premature  ventricular complexes Abnormal ECG Confirmed by Lincoln Brigham [phone]) on  03/28/2014 6:47:32 PM      MDM   Final diagnoses:  None    This is a patient w/ advance heart failure, on milrinone, who comes in w/ orthopnea, SOB, weight gain.  Patient has had gradually increasing SOB over the past month a/w 10 lb weight gain.  Patient is on milrinone gtt  through picc line at home.  No CP.   On exam, patient appears comfortable, w/ only mild increase in WOB.  Lungs CTAB.  VSS.  satting well on RA.  Concern for CHF exacerbation will obtain labs, ekg, CXR  Labs unremarkable, CXR w/ mild pulm edema.  Have spoken w/ the cards attn on call.  Will give 120 mg lasix IV in the department and observe for 2 hours to see if patient starts diuresing and if sx improve.  Pt on board w/ this plan.  After 2 hours, pt re-eval.  He has made 150 ml urine.  Have offerred admission vs. D/c home w/ modification of home regimen per cards recs over phone.  Pt has chosen d/c home w/ close opt f/u at apt on tues.  Given pt current WOB, I feel this plan is reasonable.  I have discussed the results, Dx and Tx plan with the patient. They expressed understanding and agree with the plan and were told to return to ED with any worsening of condition or concern.    Disposition: Discharge  Condition: Good  Discharge Medication List as of 03/28/2014 11:07 PM    START taking these medications   Details  potassium chloride (K-DUR) 10 MEQ tablet Take 4 tablets (40 mEq total) by mouth daily., Starting 03/28/2014, Until Discontinued, Print    ZAROXOLYN 2.5 MG tablet Take 1 tablet (2.5 mg total) by mouth daily., Starting 03/28/2014, Until Discontinued, Print        Follow Up: [facility] EMERGENCY DEPARTMENT [address] [id number] Wilhe[place] Bon[place] [address] [phone]  If symptoms worsen   Pt seen in conjunction with Dr. Janice Coffin, MD 03/29/14 0300  Azalia Bilis, MD 04/02/14 [phone]

## 2014-03-28 NOTE — ED Notes (Signed)
Pt reports increased shortness of breath since last night.  Sts he has put on 2 pounds overnight.  Sts his home health nurse told him his abdominal girth had increased by 2 inches since yesterday.  Was given dose of Lasix yesterday and sts it barely had any urine output.

## 2014-03-29 ENCOUNTER — Telehealth (HOSPITAL_COMMUNITY): Payer: Self-pay | Admitting: Vascular Surgery

## 2014-03-29 NOTE — Telephone Encounter (Signed)
Nurse was in the home today ankle measurements 23 cm , abd girth 123 yesterday 127 cm, weight 275 lung sounds are clear, he does have a cough, he voided 250 mil while in the home , hosp D/c from last night he did get 120 lasix, requested to restart potassium 10 meq 4 tabs daily , increased torsemide 3 times a day, and 1 metolazone today .Marland Kitchen If you have any question please call nurse

## 2014-03-30 ENCOUNTER — Inpatient Hospital Stay (HOSPITAL_COMMUNITY)
Admission: EM | Admit: 2014-03-30 | Discharge: 2014-04-09 | DRG: 308 | Disposition: A | Discharge status: Rehabilitation Facility | Payer: Medicare Other | Attending: Internal Medicine | Admitting: Internal Medicine

## 2014-03-30 ENCOUNTER — Emergency Department (HOSPITAL_COMMUNITY): Payer: Medicare Other

## 2014-03-30 ENCOUNTER — Other Ambulatory Visit (HOSPITAL_COMMUNITY): Payer: Self-pay

## 2014-03-30 ENCOUNTER — Encounter (HOSPITAL_COMMUNITY): Payer: Self-pay | Admitting: Emergency Medicine

## 2014-03-30 DIAGNOSIS — I69354 Hemiplegia and hemiparesis following cerebral infarction affecting left non-dominant side: Secondary | ICD-10-CM | POA: Diagnosis not present

## 2014-03-30 DIAGNOSIS — I472 Ventricular tachycardia, unspecified: Secondary | ICD-10-CM

## 2014-03-30 DIAGNOSIS — N183 Chronic kidney disease, stage 3 unspecified: Secondary | ICD-10-CM | POA: Diagnosis present

## 2014-03-30 DIAGNOSIS — M79671 Pain in right foot: Secondary | ICD-10-CM | POA: Diagnosis present

## 2014-03-30 DIAGNOSIS — I4729 Other ventricular tachycardia: Secondary | ICD-10-CM

## 2014-03-30 DIAGNOSIS — I480 Paroxysmal atrial fibrillation: Secondary | ICD-10-CM | POA: Diagnosis present

## 2014-03-30 DIAGNOSIS — I4901 Ventricular fibrillation: Secondary | ICD-10-CM | POA: Diagnosis present

## 2014-03-30 DIAGNOSIS — Z7901 Long term (current) use of anticoagulants: Secondary | ICD-10-CM | POA: Diagnosis not present

## 2014-03-30 DIAGNOSIS — Z550 Illiteracy and low-level literacy: Secondary | ICD-10-CM | POA: Diagnosis present

## 2014-03-30 DIAGNOSIS — I482 Chronic atrial fibrillation: Secondary | ICD-10-CM | POA: Diagnosis present

## 2014-03-30 DIAGNOSIS — R55 Syncope and collapse: Secondary | ICD-10-CM | POA: Diagnosis present

## 2014-03-30 DIAGNOSIS — I130 Hypertensive heart and chronic kidney disease with heart failure and stage 1 through stage 4 chronic kidney disease, or unspecified chronic kidney disease: Secondary | ICD-10-CM | POA: Diagnosis present

## 2014-03-30 DIAGNOSIS — E871 Hypo-osmolality and hyponatremia: Secondary | ICD-10-CM | POA: Diagnosis not present

## 2014-03-30 DIAGNOSIS — Z95810 Presence of automatic (implantable) cardiac defibrillator: Secondary | ICD-10-CM | POA: Diagnosis not present

## 2014-03-30 DIAGNOSIS — L03115 Cellulitis of right lower limb: Secondary | ICD-10-CM | POA: Diagnosis not present

## 2014-03-30 DIAGNOSIS — I4510 Unspecified right bundle-branch block: Secondary | ICD-10-CM | POA: Diagnosis present

## 2014-03-30 DIAGNOSIS — E876 Hypokalemia: Secondary | ICD-10-CM | POA: Diagnosis present

## 2014-03-30 DIAGNOSIS — Z66 Do not resuscitate: Secondary | ICD-10-CM | POA: Diagnosis present

## 2014-03-30 DIAGNOSIS — G8190 Hemiplegia, unspecified affecting unspecified side: Secondary | ICD-10-CM | POA: Diagnosis not present

## 2014-03-30 DIAGNOSIS — E039 Hypothyroidism, unspecified: Secondary | ICD-10-CM | POA: Diagnosis present

## 2014-03-30 DIAGNOSIS — I5022 Chronic systolic (congestive) heart failure: Secondary | ICD-10-CM | POA: Diagnosis not present

## 2014-03-30 DIAGNOSIS — R5381 Other malaise: Secondary | ICD-10-CM | POA: Diagnosis not present

## 2014-03-30 DIAGNOSIS — R0602 Shortness of breath: Secondary | ICD-10-CM

## 2014-03-30 DIAGNOSIS — Z4502 Encounter for adjustment and management of automatic implantable cardiac defibrillator: Secondary | ICD-10-CM

## 2014-03-30 DIAGNOSIS — I5023 Acute on chronic systolic (congestive) heart failure: Secondary | ICD-10-CM | POA: Diagnosis present

## 2014-03-30 DIAGNOSIS — R52 Pain, unspecified: Secondary | ICD-10-CM

## 2014-03-30 DIAGNOSIS — E875 Hyperkalemia: Secondary | ICD-10-CM | POA: Diagnosis not present

## 2014-03-30 DIAGNOSIS — I119 Hypertensive heart disease without heart failure: Secondary | ICD-10-CM | POA: Diagnosis present

## 2014-03-30 DIAGNOSIS — I428 Other cardiomyopathies: Secondary | ICD-10-CM

## 2014-03-30 DIAGNOSIS — I429 Cardiomyopathy, unspecified: Secondary | ICD-10-CM | POA: Diagnosis present

## 2014-03-30 DIAGNOSIS — I509 Heart failure, unspecified: Secondary | ICD-10-CM

## 2014-03-30 DIAGNOSIS — Z6841 Body Mass Index (BMI) 40.0 and over, adult: Secondary | ICD-10-CM | POA: Diagnosis not present

## 2014-03-30 DIAGNOSIS — I6990 Unspecified sequelae of unspecified cerebrovascular disease: Secondary | ICD-10-CM

## 2014-03-30 LAB — CBC
HCT: 44 % (ref 39.0–52.0)
Hemoglobin: 14.5 g/dL (ref 13.0–17.0)
MCH: 30.7 pg (ref 26.0–34.0)
MCHC: 33 g/dL (ref 30.0–36.0)
MCV: 93.2 fL (ref 78.0–100.0)
PLATELETS: 298 10*3/uL (ref 150–400)
RBC: 4.72 MIL/uL (ref 4.22–5.81)
RDW: 15.5 % (ref 11.5–15.5)
WBC: 9.6 10*3/uL (ref 4.0–10.5)

## 2014-03-30 LAB — PROTIME-INR
INR: 1.65 — ABNORMAL HIGH (ref 0.00–1.49)
PROTHROMBIN TIME: 19.7 s — AB (ref 11.6–15.2)

## 2014-03-30 LAB — COMPREHENSIVE METABOLIC PANEL
ALBUMIN: 3.7 g/dL (ref 3.5–5.2)
ALK PHOS: 109 U/L (ref 39–117)
ALT: 11 U/L (ref 0–53)
AST: 28 U/L (ref 0–37)
Anion gap: 14 (ref 5–15)
BILIRUBIN TOTAL: 1.7 mg/dL — AB (ref 0.3–1.2)
BUN: 16 mg/dL (ref 6–23)
CALCIUM: 9.4 mg/dL (ref 8.4–10.5)
CO2: 30 mmol/L (ref 19–32)
CREATININE: 1.79 mg/dL — AB (ref 0.50–1.35)
Chloride: 92 mmol/L — ABNORMAL LOW (ref 96–112)
GFR calc Af Amer: 49 mL/min — ABNORMAL LOW (ref >90)
GFR, EST NON AFRICAN AMERICAN: 42 mL/min — AB (ref >90)
GLUCOSE: 164 mg/dL — AB (ref 70–99)
Potassium: 3.5 mmol/L (ref 3.5–5.1)
SODIUM: 136 mmol/L (ref 135–145)
Total Protein: 7.2 g/dL (ref 6.0–8.3)

## 2014-03-30 LAB — TSH: TSH: 2.222 u[IU]/mL (ref 0.350–4.500)

## 2014-03-30 LAB — POTASSIUM: Potassium: 3 mmol/L — ABNORMAL LOW (ref 3.5–5.1)

## 2014-03-30 LAB — BRAIN NATRIURETIC PEPTIDE: B NATRIURETIC PEPTIDE 5: 484.2 pg/mL — AB (ref 0.0–100.0)

## 2014-03-30 LAB — MAGNESIUM: Magnesium: 1.9 mg/dL (ref 1.5–2.5)

## 2014-03-30 LAB — MRSA PCR SCREENING: MRSA by PCR: POSITIVE — AB

## 2014-03-30 LAB — TROPONIN I: TROPONIN I: 0.15 ng/mL — AB (ref <0.031)

## 2014-03-30 MED ORDER — NITROGLYCERIN 0.4 MG SL SUBL: 0.4000 mg | SUBLINGUAL_TABLET | SUBLINGUAL | Status: DC | PRN

## 2014-03-30 MED ORDER — LEVOTHYROXINE SODIUM 125 MCG PO TABS
250.0000 ug | ORAL_TABLET | Freq: Every day | ORAL | Status: DC
  Administered 2014-03-31 – 2014-04-09 (×10): 250 ug via ORAL
  Filled 2014-03-30 (×12): qty 2

## 2014-03-30 MED ORDER — SODIUM CHLORIDE 0.9 % IJ SOLN
3.0000 mL | Freq: Two times a day (BID) | INTRAMUSCULAR | Status: DC
  Administered 2014-03-30 – 2014-04-08 (×13): 3 mL via INTRAVENOUS

## 2014-03-30 MED ORDER — SODIUM CHLORIDE 0.9 % IV BOLUS (SEPSIS)
1000.0000 mL | Freq: Once | INTRAVENOUS | Status: AC
  Administered 2014-03-30: 1000 mL via INTRAVENOUS

## 2014-03-30 MED ORDER — ASPIRIN EC 81 MG PO TBEC
81.0000 mg | DELAYED_RELEASE_TABLET | Freq: Every day | ORAL | Status: DC
  Administered 2014-03-31: 81 mg via ORAL
  Filled 2014-03-30 (×2): qty 1

## 2014-03-30 MED ORDER — TORSEMIDE 20 MG PO TABS
60.0000 mg | ORAL_TABLET | Freq: Two times a day (BID) | ORAL | Status: DC
  Administered 2014-03-31 – 2014-04-02 (×5): 60 mg via ORAL
  Filled 2014-03-30 (×7): qty 3

## 2014-03-30 MED ORDER — CHLORHEXIDINE GLUCONATE CLOTH 2 % EX PADS
6.0000 | MEDICATED_PAD | Freq: Every day | CUTANEOUS | Status: AC
  Administered 2014-03-31 – 2014-04-04 (×5): 6 via TOPICAL

## 2014-03-30 MED ORDER — AMIODARONE HCL IN DEXTROSE 360-4.14 MG/200ML-% IV SOLN
60.0000 mg/h | INTRAVENOUS | Status: AC
  Administered 2014-03-30: 60 mg/h via INTRAVENOUS

## 2014-03-30 MED ORDER — ACYCLOVIR 400 MG PO TABS
400.0000 mg | ORAL_TABLET | Freq: Every day | ORAL | Status: DC
  Administered 2014-03-31 – 2014-04-09 (×10): 400 mg via ORAL
  Filled 2014-03-30 (×12): qty 1

## 2014-03-30 MED ORDER — HEPARIN SODIUM (PORCINE) 5000 UNIT/ML IJ SOLN
5000.0000 [IU] | Freq: Three times a day (TID) | INTRAMUSCULAR | Status: DC
  Administered 2014-03-30 – 2014-03-31 (×3): 5000 [IU] via SUBCUTANEOUS
  Filled 2014-03-30 (×4): qty 1

## 2014-03-30 MED ORDER — SODIUM CHLORIDE 0.9 % IV SOLN
INTRAVENOUS | Status: DC
  Administered 2014-03-30: 16:00:00 via INTRAVENOUS
  Administered 2014-03-31: 1 mL via INTRAVENOUS

## 2014-03-30 MED ORDER — MAGNESIUM SULFATE 2 GM/50ML IV SOLN
2.0000 g | Freq: Once | INTRAVENOUS | Status: AC
  Administered 2014-03-30: 2 g via INTRAVENOUS
  Filled 2014-03-30: qty 50

## 2014-03-30 MED ORDER — ONDANSETRON HCL 4 MG/2ML IJ SOLN
4.0000 mg | Freq: Four times a day (QID) | INTRAMUSCULAR | Status: DC | PRN
  Administered 2014-04-06 – 2014-04-09 (×2): 4 mg via INTRAVENOUS
  Filled 2014-03-30 (×2): qty 2

## 2014-03-30 MED ORDER — AMIODARONE LOAD VIA INFUSION
150.0000 mg | Freq: Once | INTRAVENOUS | Status: AC
  Administered 2014-03-30: 150 mg via INTRAVENOUS

## 2014-03-30 MED ORDER — POTASSIUM CHLORIDE 10 MEQ/100ML IV SOLN
10.0000 meq | Freq: Once | INTRAVENOUS | Status: AC
  Administered 2014-03-30: 10 meq via INTRAVENOUS
  Filled 2014-03-30: qty 100

## 2014-03-30 MED ORDER — SPIRONOLACTONE 25 MG PO TABS
25.0000 mg | ORAL_TABLET | Freq: Every day | ORAL | Status: DC
  Administered 2014-03-31: 25 mg via ORAL
  Filled 2014-03-30 (×2): qty 1

## 2014-03-30 MED ORDER — AMIODARONE HCL IN DEXTROSE 360-4.14 MG/200ML-% IV SOLN
60.0000 mg/h | INTRAVENOUS | Status: DC
  Administered 2014-03-30 (×2): 60 mg/h via INTRAVENOUS
  Filled 2014-03-30 (×2): qty 200

## 2014-03-30 MED ORDER — AMIODARONE HCL IN DEXTROSE 360-4.14 MG/200ML-% IV SOLN
60.0000 mg/h | INTRAVENOUS | Status: DC
Start: 2014-03-30 — End: 2014-03-30

## 2014-03-30 MED ORDER — POTASSIUM CHLORIDE ER 10 MEQ PO TBCR
40.0000 meq | EXTENDED_RELEASE_TABLET | Freq: Every day | ORAL | Status: DC
  Administered 2014-03-30 – 2014-03-31 (×2): 40 meq via ORAL
  Filled 2014-03-30 (×5): qty 4

## 2014-03-30 MED ORDER — FUROSEMIDE 10 MG/ML IJ SOLN
40.0000 mg | Freq: Once | INTRAMUSCULAR | Status: AC
  Administered 2014-03-30: 40 mg via INTRAVENOUS
  Filled 2014-03-30: qty 4

## 2014-03-30 MED ORDER — MILRINONE IN DEXTROSE 20 MG/100ML IV SOLN
0.3750 ug/kg/min | INTRAVENOUS | Status: DC
  Administered 2014-03-30 – 2014-04-09 (×35): 0.375 ug/kg/min via INTRAVENOUS
  Filled 2014-03-30 (×37): qty 100

## 2014-03-30 MED ORDER — AMIODARONE HCL IN DEXTROSE 360-4.14 MG/200ML-% IV SOLN
30.0000 mg/h | INTRAVENOUS | Status: DC
  Administered 2014-03-31 – 2014-04-02 (×6): 30 mg/h via INTRAVENOUS
  Filled 2014-03-30 (×14): qty 200

## 2014-03-30 MED ORDER — MUPIROCIN 2 % EX OINT
1.0000 "application " | TOPICAL_OINTMENT | Freq: Two times a day (BID) | CUTANEOUS | Status: AC
  Administered 2014-03-30 – 2014-04-04 (×10): 1 via NASAL

## 2014-03-30 MED ORDER — ALPRAZOLAM 0.25 MG PO TABS
0.2500 mg | ORAL_TABLET | Freq: Every evening | ORAL | Status: DC | PRN
  Administered 2014-03-30: 0.25 mg via ORAL
  Filled 2014-03-30: qty 1

## 2014-03-30 MED ORDER — AMIODARONE HCL 200 MG PO TABS
200.0000 mg | ORAL_TABLET | Freq: Every day | ORAL | Status: DC
  Administered 2014-03-31: 200 mg via ORAL
  Filled 2014-03-30 (×2): qty 1

## 2014-03-30 MED ORDER — WARFARIN SODIUM 2.5 MG PO TABS
12.5000 mg | ORAL_TABLET | Freq: Once | ORAL | Status: AC
  Administered 2014-03-30: 12.5 mg via ORAL
  Filled 2014-03-30: qty 1

## 2014-03-30 MED ORDER — MAGNESIUM SULFATE IN D5W 10-5 MG/ML-% IV SOLN
1.0000 g | INTRAVENOUS | Status: AC
  Administered 2014-03-30: 1 g via INTRAVENOUS
  Filled 2014-03-30: qty 100

## 2014-03-30 MED ORDER — MUPIROCIN 2 % EX OINT
TOPICAL_OINTMENT | CUTANEOUS | Status: AC
  Filled 2014-03-30: qty 22

## 2014-03-30 MED ORDER — CYCLOBENZAPRINE HCL 10 MG PO TABS
10.0000 mg | ORAL_TABLET | Freq: Two times a day (BID) | ORAL | Status: DC
  Administered 2014-03-30 – 2014-04-09 (×21): 10 mg via ORAL
  Filled 2014-03-30 (×24): qty 1

## 2014-03-30 MED ORDER — ACETAMINOPHEN 325 MG PO TABS
650.0000 mg | ORAL_TABLET | ORAL | Status: DC | PRN
  Administered 2014-04-02 – 2014-04-03 (×4): 650 mg via ORAL
  Filled 2014-03-30 (×4): qty 2

## 2014-03-30 MED ORDER — POTASSIUM CHLORIDE CRYS ER 20 MEQ PO TBCR
40.0000 meq | EXTENDED_RELEASE_TABLET | ORAL | Status: AC
  Administered 2014-03-30 (×2): 40 meq via ORAL
  Filled 2014-03-30 (×2): qty 2

## 2014-03-30 MED ORDER — AMIODARONE LOAD VIA INFUSION
150.0000 mg | Freq: Once | INTRAVENOUS | Status: AC
  Administered 2014-03-30: 150 mg via INTRAVENOUS
  Filled 2014-03-30: qty 83.34

## 2014-03-30 MED ORDER — RICOLA MT LOZG: 1.0000 | LOZENGE | Freq: Every day | OROMUCOSAL | Status: DC | PRN

## 2014-03-30 MED ORDER — AMIODARONE HCL IN DEXTROSE 360-4.14 MG/200ML-% IV SOLN
30.0000 mg/h | INTRAVENOUS | Status: DC
  Administered 2014-03-30: 60 mg/h via INTRAVENOUS
  Filled 2014-03-30 (×3): qty 200

## 2014-03-30 MED ORDER — GUAIFENESIN-DM 100-10 MG/5ML PO SYRP
10.0000 mL | ORAL_SOLUTION | ORAL | Status: DC | PRN
  Administered 2014-03-30: 10 mL via ORAL
  Filled 2014-03-30: qty 10

## 2014-03-30 MED ORDER — METOLAZONE 2.5 MG PO TABS
2.5000 mg | ORAL_TABLET | Freq: Every day | ORAL | Status: DC
  Administered 2014-03-31: 2.5 mg via ORAL
  Filled 2014-03-30 (×2): qty 1

## 2014-03-30 MED ORDER — SODIUM CHLORIDE 0.9 % IV SOLN
250.0000 mL | INTRAVENOUS | Status: DC | PRN
  Administered 2014-03-30: 250 mL via INTRAVENOUS

## 2014-03-30 MED ORDER — SODIUM CHLORIDE 0.9 % IJ SOLN
3.0000 mL | INTRAMUSCULAR | Status: DC | PRN
Start: 2014-03-30 — End: 2014-04-09

## 2014-03-30 MED ORDER — WARFARIN - PHARMACIST DOSING INPATIENT
Freq: Every day | Status: DC
  Administered 2014-04-05: 7.5

## 2014-03-30 NOTE — ED Notes (Signed)
MD at bedside. 

## 2014-03-30 NOTE — Progress Notes (Signed)
Called ED for report.  RN not available and will call back.

## 2014-03-30 NOTE — ED Notes (Signed)
At bedside with pt when he stated he didn't feel well. Pt became unresponsive and had a run of V tach captured by zoll 50 beats. Pts defibrillator fired and returned to previous rhythm. Dr. Denton Lank called to bedside. Pt now alert and oriented.

## 2014-03-30 NOTE — ED Notes (Signed)
Pt had witnessed by this RN episode in room where his eyes rolled in back of head and was in v fib. Pt's defibrillator went off and pt returned to being alert and oriented. MD at bedside.

## 2014-03-30 NOTE — ED Notes (Signed)
MD at bedside.[facility] at bedside.

## 2014-03-30 NOTE — Progress Notes (Signed)
SUBJECTIVE:  Denies chest pain or SOB.    PHYSICAL EXAM Filed Vitals:   03/30/14 1600 03/30/14 1615 03/30/14 1700 03/30/14 1800  BP: 112/72 116/69  132/55  Pulse: 82 79 88 91  Temp: 98.9 F (37.2 C)     TempSrc: Oral     Resp: 35  Height:      Weight:      SpO2: 92% 92% 97% 94%   General:  No distress Lungs:  Clear Heart:  RRR with ectopy   LABS: Lab Results  Component Value Date   TROPONINI 0.15* 03/30/2014   Results for orders placed or performed during the hospital encounter of 03/30/14 (from the past 24 hour(s))  CBC     Status: None   Collection Time: 03/30/14 10:10 AM  Result Value Ref Range   WBC 9.6 4.0 - 10.5 K/uL   RBC 4.72 4.22 - 5.81 MIL/uL   Hemoglobin 14.5 13.0 - 17.0 g/dL   HCT 84.1 32.4 - 40.1 %   MCV 93.2 78.0 - 100.0 fL   MCH 30.7 26.0 - 34.0 pg   MCHC 33.0 30.0 - 36.0 g/dL   RDW 02.7 25.3 - 66.4 %   Platelets 298 150 - 400 K/uL  Comprehensive metabolic panel     Status: Abnormal   Collection Time: 03/30/14 10:10 AM  Result Value Ref Range   Sodium 136 135 - 145 mmol/L   Potassium 3.5 3.5 - 5.1 mmol/L   Chloride 92 (L) 96 - 112 mmol/L   CO2 30 19 - 32 mmol/L   Glucose, Bld 164 (H) 70 - 99 mg/dL   BUN 16 6 - 23 mg/dL   Creatinine, Ser 4.03 (H) 0.50 - 1.35 mg/dL   Calcium 9.4 8.4 - 47.4 mg/dL   Total Protein 7.2 6.0 - 8.3 g/dL   Albumin 3.7 3.5 - 5.2 g/dL   AST 28 0 - 37 U/L   ALT 11 0 - 53 U/L   Alkaline Phosphatase 109 39 - 117 U/L   Total Bilirubin 1.7 (H) 0.3 - 1.2 mg/dL   GFR calc non Af Amer 42 (L) >90 mL/min   GFR calc Af Amer 49 (L) >90 mL/min   Anion gap 14 5 - 15  Brain natriuretic peptide     Status: Abnormal   Collection Time: 03/30/14 10:10 AM  Result Value Ref Range   B Natriuretic Peptide 484.2 (H) 0.0 - 100.0 pg/mL  Protime-INR     Status: Abnormal   Collection Time: 03/30/14 10:10 AM  Result Value Ref Range   Prothrombin Time 19.7 (H) 11.6 - 15.2 seconds   INR 1.65 (H) 0.00 - 1.49  Troponin I      Status: Abnormal   Collection Time: 03/30/14 10:10 AM  Result Value Ref Range   Troponin I 0.15 (H) <0.031 ng/mL  Magnesium     Status: None   Collection Time: 03/30/14 10:10 AM  Result Value Ref Range   Magnesium 1.9 1.5 - 2.5 mg/dL  Potassium     Status: Abnormal   Collection Time: 03/30/14  4:18 PM  Result Value Ref Range   Potassium 3.0 (L) 3.5 - 5.1 mmol/L  TSH     Status: None   Collection Time: 03/30/14  4:18 PM  Result Value Ref Range   TSH 2.222 0.350 - 4.500 uIU/mL    Intake/Output Summary (Last 24 hours) at 03/30/14 1921 Last data filed at 03/30/14 1700  Gross per 24 hour  Intake  118.27 ml  Output      0 ml  Net 118.27 ml     ASSESSMENT AND PLAN:  ICD Firing:  Patient had Torsades and ICD firing tonight.  Otherwise not complaints except tired.  Telemetry reviewed.  Bolused amiodarone again and IV mag given.    [name] 03/30/2014 7:21 PM

## 2014-03-30 NOTE — H&P (Signed)
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 LHC in 2008 with normal cors. Myoview 05/2010: Very mild distal anterior and apical ischemia, EF 17% . Previously on Amiodarone and had very high TSH and was referred to endocrinology. Amiodarone was stopped in Nov 2014 after his RFA for recurrent PAF.               He was admitted to [facility] 02/15/14 02/22/14 with cardiogenic shock. He required pressors and he was discharged on milrinone at 0.375 mcg. Discharge weight was 275 pounds. He has been seen in the office in follow up. LOV was 03/27/14 and his Amiodarone was cut back to 200 mg daily and his Torsemide was increased to 40 mg BID and spironolactone was added.    This am he took his medications and got up to use the BR. He had syncope and collapse. He was brought to [facility] ED and has two runs of VT terminated with ICD therapy. He is awake and alert. He denies any SOB at rest. Labs and ICD interrogation pending.   Problem List: Past Medical History  Diagnosis Date  . Non-ischemic cardiomyopathy     a. echo 11/10: EF 15%;   b. cath 10/08: normal cors;  c. Myoview 5/12: Very mild distal ant and apical isch, EF 17% (low risk-medical Rx cont'd);  d.  Echo 4/14:  EF 15%;  e. 05/2009 s/p SJM BiV ICD;  f. 07/2012 TEE: EF 20-25%, diff HK mild MR, mod TR.   Marland Kitchen Atrial fibrillation     a. on coumadin;  b. 07/2012 s/p TEE/DCCV. c. Recurrence 08/2012 in setting of abnormal thyroid panel.; 12/2012 AVN ablation by Dr [name]  . Hypertension   . Obesity   . Non-compliance   . Dyslexia     unable to read.  Marland Kitchen History of CVA (cerebrovascular accident) 11/2008    a. 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 RBBB (right bundle  branch block)   . Cough syncope     a. During 08/2012 adm.  . Systolic CHF, chronic 01/19/1996  . Hypothyroidism 01/19/1996    s/p RAI therapy  . Stroke 01/19/2008  . Renal insufficiency     Past Surgical History  Procedure Laterality Date  . Bi-ventricular implantable cardioverter defibrillator  (crt-d)  06/11/09    [employer] [id number] BIVENTRICULAR AICD SERIAL #[id number]  . Knee arthroscopy Left ~ 1995  . Cardiac catheterization      "several time" (09/01/2012)  . Tee without cardioversion N/A 06/23/2012    Procedure: TRANSESOPHAGEAL ECHOCARDIOGRAM (TEE);  Surgeon: Vesta Mixer, MD;  Location[facility] ENDOSCOPY;  Service: Cardiovascular;  Laterality: N/A;  TEE will be done at 0730   . Cardioversion N/A 07/27/2012    Procedure: CARDIOVERSION;  Surgeon: Vesta Mixer, MD;  Location: [facility] ENDOSCOPY;  Service: Cardiovascular;  Laterality: N/A;  . Tee without cardioversion N/A 08/10/2012    Procedure: TRANSESOPHAGEAL ECHOCARDIOGRAM (TEE);  Surgeon: Wendall Stade, MD;  Location: [facility] ENDOSCOPY;  Service: Cardiovascular;  Laterality: N/A;  . Cardioversion N/A 08/10/2012    Procedure: CARDIOVERSION;  Surgeon: Wendall Stade, MD;  Location: [facility] ENDOSCOPY;  Service: Cardiovascular;  Laterality: N/A;  . Sp pta  add intra cran  11/2008    a. 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 Ablation  01/09/2013    AVN ablation by Dr [name]  . Av node ablation N/A 01/09/2013    Procedure: AV NODE ABLATION;  Surgeon: Marinus Maw, MD;  Location: [facility] CATH LAB;  Service: Cardiovascular;  Laterality: N/A;  . Right heart catheterization N/A 02/15/2014    Procedure: RIGHT HEART CATH;  Surgeon: Dolores Patty, MD;  Location: [facility] CATH LAB;  Service: Cardiovascular;  Laterality: N/A;     Allergies:  Allergies  Allergen Reactions  . Valsartan Cough    Home Medications Current Facility-Administered Medications  Medication Dose Route Frequency Provider Last Rate Last Dose  . 0.9 %   sodium chloride infusion   Intravenous Continuous Cathren Laine, MD      . 0.9 %  sodium chloride infusion  250 mL Intravenous PRN Abelino Derrick, PA-C      . acetaminophen (TYLENOL) tablet 650 mg  650 mg Oral Q4H PRN Abelino Derrick, PA-C      . [START ON 03/31/2014] acyclovir (ZOVIRAX) tablet 400 mg  400 mg Oral Daily Abelino Derrick, PA-C      . ALPRAZolam Prudy Feeler) tablet 0.25 mg  0.25 mg Oral QHS PRN Abelino Derrick, PA-C      . amiodarone (NEXTERONE PREMIX) 360 MG/200ML (1.8 mg/mL) IV infusion  60 mg/hr Intravenous Continuous Cathren Laine, MD 33.3 mL/hr at 03/30/14 1055 60 mg/hr at 03/30/14 1055   Followed by  . amiodarone (NEXTERONE PREMIX) 360 MG/200ML (1.8 mg/mL) IV infusion  30 mg/hr Intravenous Continuous Cathren Laine, MD      . Melene Muller ON 03/31/2014] amiodarone (PACERONE) tablet 200 mg  200 mg Oral Daily Luke K Kilroy, PA-C      . [START ON 03/31/2014] aspirin EC tablet 81 mg  81 mg Oral Daily Luke K Kilroy, PA-C      . cyclobenzaprine (FLEXERIL) tablet 10 mg  10 mg Oral BID Luke K Kilroy, PA-C      . heparin injection 5,000 Units  5,000 Units Subcutaneous 3 times per day Abelino Derrick, PA-C      . [START ON 03/31/2014] levothyroxine (SYNTHROID, LEVOTHROID) tablet 250 mcg  250 mcg Oral QAC breakfast Luke K Kilroy, PA-C      . [START ON 03/31/2014] metolazone (ZAROXOLYN) tablet 2.5 mg  2.5 mg Oral Daily Luke K Kilroy, PA-C      . milrinone (PRIMACOR) 20 MG/100ML (0.2 mg/mL) infusion  0.375 mcg/kg/min Intravenous Continuous Luke K Kilroy, PA-C      . nitroGLYCERIN (NITROSTAT) SL tablet 0.4 mg  0.4 mg Sublingual Q5 Min x 3 PRN Luke K Kilroy, PA-C      . ondansetron [facility]) injection 4 mg  4 mg Intravenous Q6H PRN Abelino Derrick, PA-C      . [START ON 03/31/2014] potassium chloride (K-DUR) CR tablet 40 mEq  40 mEq Oral Daily Luke K Kilroy, PA-C      . potassium chloride 10 mEq in 100 mL IVPB  10 mEq Intravenous Once Cathren Laine, MD 100 mL/hr at 03/30/14 1131 10 mEq at 03/30/14 1131  . RICOLA LOZG 1  lozenge  1 lozenge Mouth/Throat Daily PRN Abelino Derrick, PA-C      . sodium chloride 0.9 % injection 3 mL  3 mL Intravenous Q12H Luke K Kilroy, PA-C      . sodium chloride 0.9 % injection 3 mL  3 mL  Intravenous PRN Abelino Derrick, PA-C      . [START ON 03/31/2014] spironolactone (ALDACTONE) tablet 25 mg  25 mg Oral Daily Luke K Kilroy, PA-C      . [START ON 03/31/2014] torsemide (DEMADEX) tablet 60 mg  60 mg Oral BID Abelino Derrick, PA-C       Current Outpatient Prescriptions  Medication Sig Dispense Refill  . acyclovir (ZOVIRAX) 800 MG tablet Take 400 mg by mouth daily.     Marland Kitchen ALPRAZolam (XANAX) 0.25 MG tablet Take 1 tablet (0.25 mg total) by mouth at bedtime as needed for anxiety. 30 tablet 2  . amiodarone (PACERONE) 200 MG tablet Take 1 tablet (200 mg total) by mouth daily. 60 tablet 6  . cyclobenzaprine (FLEXERIL) 10 MG tablet Take 1 tablet (10 mg total) by mouth 2 (two) times daily. 20 tablet 0  . DM-Doxylamine-Acetaminophen (NYQUIL COLD & FLU PO) Take 30 mLs by mouth daily as needed (for cold).    Marland Kitchen levothyroxine (SYNTHROID, LEVOTHROID) 125 MCG tablet Take 2 tablets (250 mcg total) by mouth daily before breakfast. Additional refill should come from PCP 60 tablet 3  . Menthol (RICOLA) LOZG Use as directed 1 lozenge in the mouth or throat daily as needed (for cough).    . milrinone (PRIMACOR) 20 MG/100ML SOLN infusion Inject 48.45 mcg/min into the vein continuous. 100 mL   . potassium chloride (K-DUR) 10 MEQ tablet Take 4 tablets (40 mEq total) by mouth daily. 40 tablet 0  . spironolactone (ALDACTONE) 25 MG tablet Take 1 tablet (25 mg total) by mouth daily. 90 tablet 3  . torsemide (DEMADEX) 20 MG tablet Take 3 tablets (60 mg total) by mouth 2 (two) times daily. 60 tablet 3  . warfarin (COUMADIN) 7.5 MG tablet Take 1 tablet daily (Patient taking differently: Take 7.5-11.25 mg by mouth daily at 6 PM. Takes 7.5mg  on Mon, Wed and Fri Takes 11.25mg  all other days) 40 tablet 3  . ZAROXOLYN 2.5 MG  tablet Take 1 tablet (2.5 mg total) by mouth daily. 1 tablet 0  . HYDROcodone-acetaminophen (NORCO/VICODIN) 5-325 MG per tablet TAKE 1 TABLET BY MOUTH EVERY 8 HOURS AS NEEDED (Patient not taking: Reported on 03/30/2014) 15 tablet 0     Family History  Problem Relation Age of Onset  . Heart disease Father 25    AMI  . Diabetes Father   . Stroke Father 33    CVA x 2  . Diabetes Sister   . Atrial fibrillation Sister   . Hyperlipidemia Mother   . Hypertension Mother   . Heart disease Mother 31    CHF     History   Social History  . Marital Status: Legally Separated    Spouse Name: N/A  . Number of Children: 0  . Years of Education: N/A   Occupational History  . DISABLED    Social History Main Topics  . Smoking status: Never Smoker   . Smokeless tobacco: Never Used  . Alcohol Use: 0.0 oz/week    0 Standard drinks or equivalent per week     Comment: Rare  . Drug Use: No  . Sexual Activity: Not Currently   Other Topics Concern  . Not on file   Social History Narrative   Marital status: divorced since 2014; married x 16 years; dating x 4 years since CVA.      Children: none      Lives: Lives alone in house in [place].  Employment:  Disabled from CVA in 2011 (L sided weakness).      Tobacco: none      Alcohol: rarely      Drugs: none      Exercise:  Sporadically.      ADLs: drives; independent with ADLs; girlfriend does grocery shopping; ambulates with cane.      Seatbelt: 100%         Review of Systems: General: negative for chills, fever, night sweats or weight changes.  Cardiovascular: negative for chest pain, dyspnea on exertion, edema, orthopnea, palpitations, paroxysmal nocturnal dyspnea or shortness of breath Dermatological: negative for rash Respiratory: negative for cough or wheezing Urologic: negative for hematuria Abdominal: negative for nausea, vomiting, diarrhea, bright red blood per rectum, melena, or hematemesis Neurologic: negative for  visual changes, syncope, or dizziness  Pt was seen by palliative care last admission and decided on DNR status. He now is not sure- "if you can bring me back do it".   All other systems reviewed and are otherwise negative except as noted above.  Physical Exam: Blood pressure 115/96, pulse 59, temperature 97.7 F (36.5 C), temperature source Oral, resp. rate 17, SpO2 96 %.  General appearance: alert, cooperative, no distress and moderately obese Neck: no carotid bruit Lungs: decrreased Rt base with scattered rales Heart: regular rate and rhythm Abdomen: soft, non-tender; bowel sounds normal; no masses,  no organomegaly Extremities: trace edema Pulses: diminnished pulses Skin: pale, cool, dry Neurologic: Grossly normal   Labs:   Results for orders placed or performed during the hospital encounter of 03/30/14 (from the past 24 hour(s))  CBC     Status: None   Collection Time: 03/30/14 10:10 AM  Result Value Ref Range   WBC 9.6 4.0 - 10.5 K/uL   RBC 4.72 4.22 - 5.81 MIL/uL   Hemoglobin 14.5 13.0 - 17.0 g/dL   HCT 16.1 09.6 - 04.5 %   MCV 93.2 78.0 - 100.0 fL   MCH 30.7 26.0 - 34.0 pg   MCHC 33.0 30.0 - 36.0 g/dL   RDW 40.9 81.1 - 91.4 %   Platelets 298 150 - 400 K/uL  Comprehensive metabolic panel     Status: Abnormal   Collection Time: 03/30/14 10:10 AM  Result Value Ref Range   Sodium 136 135 - 145 mmol/L   Potassium 3.5 3.5 - 5.1 mmol/L   Chloride 92 (L) 96 - 112 mmol/L   CO2 30 19 - 32 mmol/L   Glucose, Bld 164 (H) 70 - 99 mg/dL   BUN 16 6 - 23 mg/dL   Creatinine, Ser 7.82 (H) 0.50 - 1.35 mg/dL   Calcium 9.4 8.4 - 95.6 mg/dL   Total Protein 7.2 6.0 - 8.3 g/dL   Albumin 3.7 3.5 - 5.2 g/dL   AST 28 0 - 37 U/L   ALT 11 0 - 53 U/L   Alkaline Phosphatase 109 39 - 117 U/L   Total Bilirubin 1.7 (H) 0.3 - 1.2 mg/dL   GFR calc non Af Amer 42 (L) >90 mL/min   GFR calc Af Amer 49 (L) >90 mL/min   Anion gap 14 5 - 15  Brain natriuretic peptide     Status: Abnormal    Collection Time: 03/30/14 10:10 AM  Result Value Ref Range   B Natriuretic Peptide 484.2 (H) 0.0 - 100.0 pg/mL  Protime-INR     Status: Abnormal   Collection Time: 03/30/14 10:10 AM  Result Value Ref Range   Prothrombin Time 19.7 (H) 11.6 -  15.2 seconds   INR 1.65 (H) 0.00 - 1.49  Troponin I     Status: Abnormal   Collection Time: 03/30/14 10:10 AM  Result Value Ref Range   Troponin I 0.15 (H) <0.031 ng/mL  Magnesium     Status: None   Collection Time: 03/30/14 10:10 AM  Result Value Ref Range   Magnesium 1.9 1.5 - 2.5 mg/dL     Radiology/Studies: Dg Chest 2 View  03/28/2014   CLINICAL DATA:  Shortness of breath since last night.  EXAM: CHEST  2 VIEW  COMPARISON:  02/18/2014  FINDINGS: Pacer/AICD device.  A right internal jugular line terminates over the internal jugular vein. Midline trachea. Moderate cardiomegaly. No pleural effusion or pneumothorax. Mild interstitial edema. No lobar consolidation.  IMPRESSION: Mild congestive heart failure.  Right internal jugular line with tip projecting over the low right neck. Presuming low SVC position is desired, this should be advanced approximately 11 cm.   Electronically Signed   By: Jeronimo Greaves M.D.   On: 03/28/2014 20:47   Dg Chest Port 1 View  03/30/2014   CLINICAL DATA:  54 year old male with a history of syncope. Report of AICD discharge  EXAM: PORTABLE CHEST - 1 VIEW  COMPARISON:  03/28/2014, fluoroscopy 02/21/2014  FINDINGS: Cardiomediastinal silhouette is similar to prior with cardiomegaly.  Central vascular congestion with right hilar opacities.  No pneumothorax.  No large pleural effusion. Retrocardiac region not well evaluated given the EKG leads, defibrillator pads, and overlying soft tissues of the chest wall.  Unchanged position of AICD generator on the left chest wall with 3 leads in place.  Right IJ tunneled central catheter has been withdrawn to the cervical internal jugular vein since the placement 02/21/2014.  IMPRESSION: Evidence  of pulmonary vascular congestion/developing edema.  Cardiomegaly.  Interval withdrawal of the right IJ central venous catheter into the cervical internal jugular vein.  Overlying defibrillator pads.  Signed,  Yvone Neu. Loreta Ave, DO  [facility]  [facility]   Electronically Signed   By: Gilmer Mor D.O.   On: 03/30/2014 11:33    EKG: V paced, PVCs  ASSESSMENT AND PLAN:  Principal Problem:   Syncope and collapse Active Problems:   Morbid obesity-BMI43   Hypertensive heart disease   Biventricular implantable cardioverter-defibrillator in situ   Non-ischemic cardiomyopathy   Chronic systolic heart failure   Rt brain CVA 2010-TPA   CKD (chronic kidney disease), stage III   PAF-RFA  01/07/13   Hypothyroid-elevated TSH on Amiodarone   Chronic anticoagulation   NSVT- Amiodarone added Feb 2016   Acute on chronic systolic CHF (congestive heart failure)   DNR (do not resuscitate)   VT (ventricular tachycardia)   ICD discharge   PLAN: Pt has received Amiodarone bolus and is on an Amiodarone drip. Rhythm is currently stable. He has received IV K+ and Mg++. I will order Lasix 40 mg IV x 1. CHF team to followup.   Jolene Provost, PA-C  03/30/2014, 12:25 PM    Attending note:  Patient seen and examined. Reviewed extensive records and discussed the case with Mr. Lenn Cal. [name] is a medically complex patient well-known to the Advanced Heart Failure clinic with a history of nonischemic cardiomyopathy, LVEF 10-15% range, status post [employer] Jude biventricular ICD, and currently on outpatient milrinone at 0.375 mcg. He has had some recent trouble with increasing weight, torsemide was just advanced on March 9. He has also had some outpatient IV Lasix administered. This morning  after getting up to go to the bathroom and taking his medications, he had a sudden episode of syncope that occurred after feeling "weak." He does not endorse having any  palpitations or chest pain at that time. He woke up on the floor, does not recall if he was shocked by his ICD or not. Subsequent to this he has been noted to have VT/VF and ICD shocks during ER evaluation, presently is on IV amiodarone with stable rhythm and pacing. Potassium is low normal at 3.5, BNP 484, troponin I minimally abnormal at 0.15. Chest x-ray does show development of pulmonary edema with cardiomegaly. Patient states his weight is up approximately 4 pounds. He reports compliance with his medications, has milrinone infusing through a right chest catheter. His lungs exhibit crackles at the bases, elevated JVP is present with increased neck girth, cardiac exam with indistinct PMI and distant heart sounds, regular rhythm with ectopy noted. Legs show 1+ edema.  Plan is to admit [name] to the ICU for further management. I have alerted Dr. Gala Romney to his hospitalization. Amiodarone will continue via IV infusion for now, he has also received potassium and magnesium, follow-up BMET to be obtained. We will continue milrinone infusion and baseline cardiac medications except transitioned IV Lasix temporarily. medications, although transitioned IV Lasix temporarily. Device interrogation is pending.  My understanding per chart review is that he is not a candidate for advanced heart failure therapeutic options such as LVAD. Overall prognosis is quite poor. He has been seen by Palliative Care within the last few months, had decided on DO NOT RESUSCITATE status at that time, although does not seem to be sure about this at the present time. His overall insight into his current cardiac status and condition seems to be poor. I have encouraged further discussion with the Advanced Heart Failure team regarding realistic treatment course and options.  Jonelle Sidle, M.D., F.A.C.C.

## 2014-03-30 NOTE — Progress Notes (Signed)
Pt had episode of vtach/ vfib with ICD fire.  Pt remained awake during this episode.  Refer to strip placed in chart.  Dr. Antoine Poche notified and will come assess patient.  Will continue to monitor pt closely.

## 2014-03-30 NOTE — Progress Notes (Signed)
ANTICOAGULATION CONSULT NOTE - Initial Consult  Pharmacy Consult for warfarin Indication: atrial fibrillation and stroke  Allergies  Allergen Reactions  . Valsartan Cough    Patient Measurements:    Vital Signs: Temp: 97.7 F (36.5 C) (03/12 1000) Temp Source: Oral (03/12 1000) BP: 169/136 mmHg (03/12 1245) Pulse Rate: 70 (03/12 1245)  Labs:  Recent Labs  03/28/14 1827 03/28/14 2216 03/30/14 1010  HGB 13.3  --  14.5  HCT 40.9  --  44.0  PLT 269  --  298  LABPROT  --  21.9* 19.7*  INR  --  1.90* 1.65*  CREATININE 1.62*  --  1.79*  TROPONINI  --   --  0.15*    Estimated Creatinine Clearance: 63.3 mL/min (by C-G formula based on Cr of 1.79).   Medical History: Past Medical History  Diagnosis Date  . Non-ischemic cardiomyopathy     a. echo 11/10: EF 15%;   b. cath 10/08: normal cors;  c. Myoview 5/12: Very mild distal ant and apical isch, EF 17% (low risk-medical Rx cont'd);  d.  Echo 4/14:  EF 15%;  e. 05/2009 s/p SJM BiV ICD;  f. 07/2012 TEE: EF 20-25%, diff HK mild MR, mod TR.   Marland Kitchen Atrial fibrillation     a. on coumadin;  b. 07/2012 s/p TEE/DCCV. c. Recurrence 08/2012 in setting of abnormal thyroid panel.; 12/2012 AVN ablation by Dr [name]  . Hypertension   . Obesity   . Non-compliance   . Dyslexia     unable to read.  Marland Kitchen History of CVA (cerebrovascular accident) 11/2008    a. 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 RBBB (right bundle branch block)   . Cough syncope     a. During 08/2012 adm.  . Systolic CHF, chronic 01/19/1996  . Hypothyroidism 01/19/1996    s/p RAI therapy  . Stroke 01/19/2008  . Renal insufficiency    Assessment: 53 yom presented to the ED after a syncopal episode. He is on chronic coumadin for history of afib and CVA. INR today is subtherapeutic at 1.65. CBC is WNL. Heparin SQ is also ordered. He is also on chronic amiodarone.   Goal of Therapy:  INR 2-3 Monitor platelets by anticoagulation protocol: Yes   Plan:  - Warfarin  12.5mg  PO x 1 tonight - Daily INR - DC heparin SQ when INR is >2  Raymond [name], Drake Leach 03/30/2014,1:12 PM

## 2014-03-30 NOTE — ED Notes (Signed)
Report attempted.  Nurse to call back.

## 2014-03-30 NOTE — Progress Notes (Signed)
Episode of vtach/vfib with shock.  Will continue to monitor pt closely.

## 2014-03-30 NOTE — Progress Notes (Signed)
Dr. Antoine Poche at bedside to evaluate pt.

## 2014-03-30 NOTE — ED Provider Notes (Signed)
CSN: [id number]     Arrival date & time 03/30/14  0946 History   First MD Initiated Contact with Patient 03/30/14 [phone]     Chief Complaint  Patient presents with  . Loss of Consciousness     (Consider location/radiation/quality/duration/timing/severity/associated sxs/prior Treatment) Patient is a 54 y.o. male presenting with syncope. The history is provided by the patient.  Loss of Consciousness Associated symptoms: no chest pain, no confusion, no fever, no headaches, no shortness of breath and no vomiting   pt presents after ?syncopal event at home after using bathroom/urinating. Pt unsure if defib fired or not. Denies any preceding cp or discomfort. No sob. Pt denies sense of palpitations or fast or irregular heartbeat.  Pt with hx non ischemic cardiomyopathy, has [employer], on milrinone drip. States compliant w normal meds, denies recent change. No recent febrile illness. No cough or uri c/o. No abd pain. No nvd. No gu c/o. Pt indicates has had pacer/defib x 5 yrs, and only fired once prior several yrs ago.      Past Medical History  Diagnosis Date  . Non-ischemic cardiomyopathy     a. echo 11/10: EF 15%;   b. cath 10/08: normal cors;  c. Myoview 5/12: Very mild distal ant and apical isch, EF 17% (low risk-medical Rx cont'd);  d.  Echo 4/14:  EF 15%;  e. 05/2009 s/p SJM BiV ICD;  f. 07/2012 TEE: EF 20-25%, diff HK mild MR, mod TR.   Marland Kitchen Atrial fibrillation     a. on coumadin;  b. 07/2012 s/p TEE/DCCV. c. Recurrence 08/2012 in setting of abnormal thyroid panel.; 12/2012 AVN ablation by Dr [name]  . Hypertension   . Obesity   . Non-compliance   . Dyslexia     unable to read.  Marland Kitchen History of CVA (cerebrovascular accident) 11/2008    a. 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 RBBB (right bundle branch block)   . Cough syncope     a. During 08/2012 adm.  . Systolic CHF, chronic 01/19/1996  . Hypothyroidism 01/19/1996    s/p RAI therapy  . Stroke 01/19/2008  . Renal  insufficiency    Past Surgical History  Procedure Laterality Date  . Bi-ventricular implantable cardioverter defibrillator  (crt-d)  06/11/09    [facility] [employer] [id number] BIVENTRICULAR AICD SERIAL #[id number]  . Knee arthroscopy Left ~ 1995  . Cardiac catheterization      "several time" (09/01/2012)  . Tee without cardioversion N/A 06/23/2012    Procedure: TRANSESOPHAGEAL ECHOCARDIOGRAM (TEE);  Surgeon: Vesta Mixer, MD;  Location: [facility] ENDOSCOPY;  Service: Cardiovascular;  Laterality: N/A;  TEE will be done at 0730   . Cardioversion N/A 07/27/2012    Procedure: CARDIOVERSION;  Surgeon: Vesta Mixer, MD;  Location: [facility] ENDOSCOPY;  Service: Cardiovascular;  Laterality: N/A;  . Tee without cardioversion N/A 08/10/2012    Procedure: TRANSESOPHAGEAL ECHOCARDIOGRAM (TEE);  Surgeon: Wendall Stade, MD;  Location: [facility] ENDOSCOPY;  Service: Cardiovascular;  Laterality: N/A;  . Cardioversion N/A 08/10/2012    Procedure: CARDIOVERSION;  Surgeon: Wendall Stade, MD;  Location: [facility] ENDOSCOPY;  Service: Cardiovascular;  Laterality: N/A;  . Sp pta add intra cran  11/2008    a. 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 Ablation  01/09/2013    AVN ablation by Dr [name]  . Av node ablation N/A 01/09/2013    Procedure: AV NODE ABLATION;  Surgeon: Marinus Maw, MD;  Location: [facility] CATH LAB;  Service: Cardiovascular;  Laterality: N/A;  . Right heart catheterization N/A 02/15/2014    Procedure: RIGHT HEART CATH;  Surgeon: Dolores Patty, MD;  Location: [facility] CATH LAB;  Service: Cardiovascular;  Laterality: N/A;   Family History  Problem Relation Age of Onset  . Heart disease Father 54    AMI  . Diabetes Father   . Stroke Father 69    CVA x 2  . Diabetes Sister   . Atrial fibrillation Sister   . Hyperlipidemia Mother   . Hypertension Mother   . Heart disease Mother 22    CHF   History  Substance Use Topics  . Smoking status: Never Smoker   . Smokeless tobacco: Never Used   . Alcohol Use: 0.0 oz/week    0 Standard drinks or equivalent per week     Comment: Rare    Review of Systems  Constitutional: Negative for fever and chills.  HENT: Negative for sore throat.   Eyes: Negative for redness.  Respiratory: Negative for cough and shortness of breath.   Cardiovascular: Positive for syncope. Negative for chest pain.  Gastrointestinal: Negative for vomiting, abdominal pain and diarrhea.  Endocrine: Negative for polyuria.  Genitourinary: Negative for flank pain.  Musculoskeletal: Negative for back pain and neck pain.  Skin: Negative for rash.  Neurological: Positive for syncope. Negative for headaches.  Hematological: Does not bruise/bleed easily.  Psychiatric/Behavioral: Negative for confusion.      Allergies  Valsartan  Home Medications   Prior to Admission medications   Medication Sig Start Date End Date Taking? Authorizing Provider  acyclovir (ZOVIRAX) 800 MG tablet Take 400 mg by mouth daily.     Historical Provider, MD  ALPRAZolam Prudy Feeler) 0.25 MG tablet Take 1 tablet (0.25 mg total) by mouth at bedtime as needed for anxiety. 04/16/13   Ethelda Chick, MD  amiodarone (PACERONE) 200 MG tablet Take 1 tablet (200 mg total) by mouth daily. 02/27/14   Amy D Filbert Schilder, NP  cyclobenzaprine (FLEXERIL) 10 MG tablet Take 1 tablet (10 mg total) by mouth 2 (two) times daily. 07/26/13   Pearline Cables, MD  DM-Doxylamine-Acetaminophen (NYQUIL COLD & FLU PO) Take 30 mLs by mouth daily as needed (for cold).    Historical Provider, MD  HYDROcodone-acetaminophen (NORCO/VICODIN) 5-325 MG per tablet TAKE 1 TABLET BY MOUTH EVERY 8 HOURS AS NEEDED Patient taking differently: TAKE 1 TABLET BY MOUTH EVERY 8 HOURS AS NEEDED FOR PAIN 07/30/13   Pearline Cables, MD  levothyroxine (SYNTHROID, LEVOTHROID) 125 MCG tablet Take 2 tablets (250 mcg total) by mouth daily before breakfast. Additional refill should come from PCP 03/27/14   Amy D Clegg, NP  Menthol (RICOLA) LOZG Use as  directed 1 lozenge in the mouth or throat daily as needed (for cough).    Historical Provider, MD  milrinone (PRIMACOR) 20 MG/100ML SOLN infusion Inject 48.45 mcg/min into the vein continuous. 02/22/14   Amy D Filbert Schilder, NP  potassium chloride (K-DUR) 10 MEQ tablet Take 4 tablets (40 mEq total) by mouth daily. 03/28/14   Silas Flood, MD  spironolactone (ALDACTONE) 25 MG tablet Take 1 tablet (25 mg total) by mouth daily. 08/29/13   Aundria Rud, NP  torsemide (DEMADEX) 20 MG tablet Take 3 tablets (60 mg total) by mouth 2 (two) times daily. 03/28/14   Silas Flood, MD  warfarin (COUMADIN) 7.5 MG tablet Take 1 tablet daily Patient taking differently: Take 7.5-11.25 mg by mouth daily at 6 PM. Takes  7.5mg  on Mon, Wed and Fri Takes 11.25mg  all other days 02/22/14   Sherald Hess, NP  ZAROXOLYN 2.5 MG tablet Take 1 tablet (2.5 mg total) by mouth daily. 03/28/14   Silas Flood, MD   BP 86/65 mmHg  Pulse 68  Temp(Src) 97.7 F (36.5 C) (Oral)  Resp 19  SpO2 96% Physical Exam  Constitutional: He is oriented to person, place, and time. He appears well-developed and well-nourished.  HENT:  Mouth/Throat: Oropharynx is clear and moist.  Eyes: Pupils are equal, round, and reactive to light.  Neck: Normal range of motion. Neck supple. No tracheal deviation present.  Cardiovascular: Normal rate, normal heart sounds and intact distal pulses.   Irregular rhythm  Pulmonary/Chest: Effort normal and breath sounds normal. No accessory muscle usage. No respiratory distress.  cvl right chest without sign of infection.   Abdominal: Soft. Bowel sounds are normal. He exhibits no distension and no mass. There is no tenderness. There is no rebound and no guarding.  Genitourinary:  No cva tenderness  Musculoskeletal: Normal range of motion. He exhibits no tenderness.  Mild bil ankle edema.   Neurological: He is alert and oriented to person, place, and time.  Skin: Skin is warm and dry. He is not diaphoretic.  Psychiatric: He has  a normal mood and affect.  Nursing note and vitals reviewed.   ED Course  Procedures (including critical care time) Labs Review  Results for orders placed or performed during the hospital encounter of 03/30/14  CBC  Result Value Ref Range   WBC 9.6 4.0 - 10.5 K/uL   RBC 4.72 4.22 - 5.81 MIL/uL   Hemoglobin 14.5 13.0 - 17.0 g/dL   HCT 16.1 09.6 - 04.5 %   MCV 93.2 78.0 - 100.0 fL   MCH 30.7 26.0 - 34.0 pg   MCHC 33.0 30.0 - 36.0 g/dL   RDW 40.9 81.1 - 91.4 %   Platelets 298 150 - 400 K/uL  Comprehensive metabolic panel  Result Value Ref Range   Sodium 136 135 - 145 mmol/L   Potassium 3.5 3.5 - 5.1 mmol/L   Chloride 92 (L) 96 - 112 mmol/L   CO2 30 19 - 32 mmol/L   Glucose, Bld 164 (H) 70 - 99 mg/dL   BUN 16 6 - 23 mg/dL   Creatinine, Ser 7.82 (H) 0.50 - 1.35 mg/dL   Calcium 9.4 8.4 - 95.6 mg/dL   Total Protein 7.2 6.0 - 8.3 g/dL   Albumin 3.7 3.5 - 5.2 g/dL   AST 28 0 - 37 U/L   ALT 11 0 - 53 U/L   Alkaline Phosphatase 109 39 - 117 U/L   Total Bilirubin 1.7 (H) 0.3 - 1.2 mg/dL   GFR calc non Af Amer 42 (L) >90 mL/min   GFR calc Af Amer 49 (L) >90 mL/min   Anion gap 14 5 - 15  Brain natriuretic peptide  Result Value Ref Range   B Natriuretic Peptide 484.2 (H) 0.0 - 100.0 pg/mL  Protime-INR  Result Value Ref Range   Prothrombin Time 19.7 (H) 11.6 - 15.2 seconds   INR 1.65 (H) 0.00 - 1.49  Troponin I  Result Value Ref Range   Troponin I 0.15 (H) <0.031 ng/mL  Magnesium  Result Value Ref Range   Magnesium 1.9 1.5 - 2.5 mg/dL   Dg Chest 2 View  03/03/863   CLINICAL DATA:  Shortness of breath since last night.  EXAM: CHEST  2 VIEW  COMPARISON:  02/18/2014  FINDINGS: Pacer/AICD device.  A right internal jugular line terminates over the internal jugular vein. Midline trachea. Moderate cardiomegaly. No pleural effusion or pneumothorax. Mild interstitial edema. No lobar consolidation.  IMPRESSION: Mild congestive heart failure.  Right internal jugular line with tip  projecting over the low right neck. Presuming low SVC position is desired, this should be advanced approximately 11 cm.   Electronically Signed   By: Jeronimo Greaves M.D.   On: 03/28/2014 20:47   Dg Chest Port 1 View  03/30/2014   CLINICAL DATA:  54 year old male with a history of syncope. Report of AICD discharge  EXAM: PORTABLE CHEST - 1 VIEW  COMPARISON:  03/28/2014, fluoroscopy 02/21/2014  FINDINGS: Cardiomediastinal silhouette is similar to prior with cardiomegaly.  Central vascular congestion with right hilar opacities.  No pneumothorax.  No large pleural effusion. Retrocardiac region not well evaluated given the EKG leads, defibrillator pads, and overlying soft tissues of the chest wall.  Unchanged position of AICD generator on the left chest wall with 3 leads in place.  Right IJ tunneled central catheter has been withdrawn to the cervical internal jugular vein since the placement 02/21/2014.  IMPRESSION: Evidence of pulmonary vascular congestion/developing edema.  Cardiomegaly.  Interval withdrawal of the right IJ central venous catheter into the cervical internal jugular vein.  Overlying defibrillator pads.  Signed,  Yvone Neu. Loreta Ave, DO  [facility]  [facility]   Electronically Signed   By: Gilmer Mor D.O.   On: 03/30/2014 11:33         EKG Interpretation   Date/Time:  Saturday March 30 2014 10:02:48 EST Ventricular Rate:  79 PR Interval:    QRS Duration: 151 QT Interval:  435 QTC Calculation: 499 R Axis:   -41 Text Interpretation:  ventricular-paced complexes Premature ventricular  complexes Right bundle branch block Left ventricular hypertrophy  Non-specific ST-t changes Confirmed by Denton Lank  MD, Caryn Bee ([id number]) on  03/30/2014 10:11:12 AM      MDM   Iv ns. Continuous pulse ox and monitor. o2 [place]. Labs.   Reviewed nursing notes and prior charts for additional history.   Pt with v tach arrest, v briefly unresponsive, pts defib fired  x 1, with return prior rhythm, hr 80, pulses, and return normal level of consciousness.  Cardiology paged.  Labs pending.  Pt w another 2 episodes pulseless v tach arrest, w v brief loc, defib fired x 2. Return normal loc. Pulses. No new c/o. No cp or sob. Amiodarone iv bolus and gtt.  Cardiology repaged.  Recheck pt, no cp. No sob. No new c/o.   Labs and device interrogation remain pending.  Called to room, 4th episodic vtach arrest, defib fired.    Recheck return pulses, normal loc. No new c/o. No cp.  Labs pending. Given recent mildly low k, will order kcl 10 iv. Mg 2 gm iv.  Cardiology called.  CRITICAL CARE  RE syncope, recurrent ventricular tachycardia arrest requiring defibrillation, non ischemic cardiomyopathy. Performed by: Suzi Roots Total critical care time: 45 Critical care time was exclusive of separately billable procedures and treating other patients. Critical care was necessary to treat or prevent imminent or life-threatening deterioration. Critical care was time spent personally by me on the following activities: development of treatment plan with patient and/or surrogate as well as nursing, discussions with consultants, evaluation of patient's response to treatment, examination of patient, obtaining history from patient or surrogate, ordering and performing treatments and interventions, ordering and review of laboratory studies,  ordering and review of radiographic studies, pulse oximetry and re-evaluation of patient's condition.      Cathren Laine, MD 03/30/14 1149

## 2014-03-30 NOTE — Progress Notes (Signed)
Dr. Antoine Poche notified of increased coughing episodes along with tachypnea 25-35. Cough is strong, non-productive.  Pt remains on room air and O2 saturations remain greater than 92. Will continue to monitor pt closely.

## 2014-03-30 NOTE — ED Notes (Signed)
Pt has had 3 total episodes of v fib where defib kicks in. Pt becomes unresponsive during those episodes. MD at bedside. Family consoled.

## 2014-03-30 NOTE — ED Notes (Signed)
Pt's device being interrogated.

## 2014-03-30 NOTE — ED Notes (Signed)
Pt at home using restroom and syncopized. ES CHF pt.

## 2014-03-30 NOTE — Progress Notes (Signed)
Episode of vtach/vfib with shock.  Torsades noted.  Will continue to monitor pt closely.  Dr. Antoine Poche to evaluate pt.

## 2014-03-31 DIAGNOSIS — I4901 Ventricular fibrillation: Secondary | ICD-10-CM

## 2014-03-31 LAB — MAGNESIUM: Magnesium: 2.4 mg/dL (ref 1.5–2.5)

## 2014-03-31 LAB — BASIC METABOLIC PANEL
ANION GAP: 11 (ref 5–15)
ANION GAP: 11 (ref 5–15)
BUN: 18 mg/dL (ref 6–23)
BUN: 20 mg/dL (ref 6–23)
CHLORIDE: 90 mmol/L — AB (ref 96–112)
CO2: 33 mmol/L — AB (ref 19–32)
CO2: 34 mmol/L — ABNORMAL HIGH (ref 19–32)
Calcium: 8.8 mg/dL (ref 8.4–10.5)
Calcium: 9.1 mg/dL (ref 8.4–10.5)
Chloride: 90 mmol/L — ABNORMAL LOW (ref 96–112)
Creatinine, Ser: 2.16 mg/dL — ABNORMAL HIGH (ref 0.50–1.35)
Creatinine, Ser: 1.97 mg/dL — ABNORMAL HIGH (ref 0.50–1.35)
GFR calc Af Amer: 39 mL/min — ABNORMAL LOW (ref >90)
GFR calc Af Amer: 44 mL/min — ABNORMAL LOW (ref >90)
GFR calc non Af Amer: 34 mL/min — ABNORMAL LOW (ref >90)
GFR calc non Af Amer: 38 mL/min — ABNORMAL LOW (ref >90)
Glucose, Bld: 148 mg/dL — ABNORMAL HIGH (ref 70–99)
Glucose, Bld: 148 mg/dL — ABNORMAL HIGH (ref 70–99)
Potassium: 2.9 mmol/L — ABNORMAL LOW (ref 3.5–5.1)
Potassium: 3.1 mmol/L — ABNORMAL LOW (ref 3.5–5.1)
Sodium: 134 mmol/L — ABNORMAL LOW (ref 135–145)
Sodium: 135 mmol/L (ref 135–145)

## 2014-03-31 LAB — PROTIME-INR
INR: 2.05 — ABNORMAL HIGH (ref 0.00–1.49)
Prothrombin Time: 23.3 seconds — ABNORMAL HIGH (ref 11.6–15.2)

## 2014-03-31 MED ORDER — POTASSIUM CHLORIDE 10 MEQ/100ML IV SOLN: 10.0000 meq | INTRAVENOUS | Status: AC

## 2014-03-31 MED ORDER — BUMETANIDE 0.25 MG/ML IJ SOLN
1.0000 mg | Freq: Once | INTRAMUSCULAR | Status: AC
  Administered 2014-03-31: 1 mg via INTRAVENOUS
  Filled 2014-03-31: qty 4

## 2014-03-31 MED ORDER — GUAIFENESIN-CODEINE 100-10 MG/5ML PO SOLN
5.0000 mL | ORAL | Status: DC | PRN
  Administered 2014-03-31 – 2014-04-04 (×11): 5 mL via ORAL
  Filled 2014-03-31 (×12): qty 5

## 2014-03-31 MED ORDER — WARFARIN VIDEO
Freq: Once | Status: AC
  Administered 2014-03-31: 09:00:00

## 2014-03-31 MED ORDER — WARFARIN SODIUM 6 MG PO TABS
12.0000 mg | ORAL_TABLET | Freq: Once | ORAL | Status: AC
  Administered 2014-03-31: 12 mg via ORAL
  Filled 2014-03-31: qty 2

## 2014-03-31 MED ORDER — POTASSIUM CHLORIDE 10 MEQ/100ML IV SOLN
10.0000 meq | INTRAVENOUS | Status: AC
  Administered 2014-03-31 – 2014-04-01 (×4): 10 meq via INTRAVENOUS
  Filled 2014-03-31: qty 100

## 2014-03-31 MED ORDER — POTASSIUM CHLORIDE CRYS ER 20 MEQ PO TBCR
40.0000 meq | EXTENDED_RELEASE_TABLET | ORAL | Status: AC
  Administered 2014-03-31 (×2): 40 meq via ORAL

## 2014-03-31 NOTE — Progress Notes (Signed)
Text/paged MD Hochrein to inform patient's Potassium 2.9. Ivery Quale, RN

## 2014-03-31 NOTE — Progress Notes (Signed)
Dr. [name] paged again regarding orders for K x 4 IVPB. Pt has received a total of 120 mEq K PO since this AM Bmet resulted. Will continue to told IVPB K until orders clarified.

## 2014-03-31 NOTE — Progress Notes (Addendum)
Corine Shelter PA paged regarding pt's K orders. Instructed to call Dr. [name] the ordering provider. Dr. [name] paged at 1041 and 1123 to clarified Potassium orders. Pt received K 80 meq this AM for K2.9 and has scheduled due at 1200. Dr. [name] wrote orders for 10 meq IVPBx4. Will hold IVPB until orders clarified.

## 2014-03-31 NOTE — Progress Notes (Signed)
ANTICOAGULATION CONSULT NOTE - Follow-Up Consult  Pharmacy Consult for warfarin Indication: atrial fibrillation and stroke  Allergies  Allergen Reactions  . Valsartan Cough    Patient Measurements: Height: 5\' 9"  (175.3 cm) Weight: 272 lb 14.9 oz (123.8 kg) IBW/kg (Calculated) : 70.7  Vital Signs: Temp: 97 F (36.1 C) (03/13 0400) Temp Source: Axillary (03/13 0400) BP: 103/47 mmHg (03/13 0700) Pulse Rate: 72 (03/13 0700)  Labs:  Recent Labs  03/28/14 1827 03/28/14 2216 03/30/14 1010 03/31/14 0330  HGB 13.3  --  14.5  --   HCT 40.9  --  44.0  --   PLT 269  --  298  --   LABPROT  --  21.9* 19.7* 23.3*  INR  --  1.90* 1.65* 2.05*  CREATININE 1.62*  --  1.79* 2.16*  TROPONINI  --   --  0.15*  --     Estimated Creatinine Clearance: 52.6 mL/min (by C-G formula based on Cr of 2.16).   Medical History: Past Medical History  Diagnosis Date  . Non-ischemic cardiomyopathy     a. echo 11/10: EF 15%;   b. cath 10/08: normal cors;  c. Myoview 5/12: Very mild distal ant and apical isch, EF 17% (low risk-medical Rx cont'd);  d.  Echo 4/14:  EF 15%;  e. 05/2009 s/p SJM BiV ICD;  f. 07/2012 TEE: EF 20-25%, diff HK mild MR, mod TR.   Marland Kitchen Atrial fibrillation     a. on coumadin;  b. 07/2012 s/p TEE/DCCV. c. Recurrence 08/2012 in setting of abnormal thyroid panel.; 12/2012 AVN ablation by Dr [name]  . Hypertension   . Obesity   . Non-compliance   . Dyslexia     unable to read.  Marland Kitchen History of CVA (cerebrovascular accident) 11/2008    a. 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 RBBB (right bundle branch block)   . Cough syncope     a. During 08/2012 adm.  . Systolic CHF, chronic 01/19/1996  . Hypothyroidism 01/19/1996    s/p RAI therapy  . Stroke 01/19/2008  . Renal insufficiency    Assessment: 54yo male presented to the ED after a syncopal episode. He is on chronic coumadin for history of afib and CVA. INR on admit was subtherapeutic at 1.65 > now therapeutic at 2.05. CBC  is WNL, no bleeding reported. He is also on chronic amiodarone.   PTA dose is 7.5mg  MWF, 11.25mg  all other days (7.5mg  tabs)  Goal of Therapy:  INR 2-3 Monitor platelets by anticoagulation protocol: Yes   Plan:  - Warfarin 12mg  PO x 1 tonight - DC heparin SubQ - Monitor INR, CBC, s/sx of bleeding  Waynette Buttery, PharmD Clinical Pharmacy Resident Pager: [phone] 03/31/2014 8:22 AM

## 2014-03-31 NOTE — Progress Notes (Signed)
Utilization Review Completed.   Desa Rech, RN, BSN Nurse Case Manager  

## 2014-03-31 NOTE — Progress Notes (Addendum)
Patient ID: Elery Cadenhead, male   DOB: 1960/07/12, 54 y.o.   MRN: [id number]    EP Consult Note  Indication for consult: Recurrent VT/VF  Referring MD: Dr. Shirlee Latch   Patient Name: Carloyn Manner Date of Encounter: 03/31/2014   Past medical history   Principal Problem:   Syncope and collapse Active Problems:   Morbid obesity-BMI43   Hypertensive heart disease   Biventricular implantable cardioverter-defibrillator in situ   Non-ischemic cardiomyopathy   Chronic systolic heart failure   Rt brain CVA 2010-TPA   CKD (chronic kidney disease), stage III   PAF-RFA  01/07/13   Hypothyroid-elevated TSH on Amiodarone   Chronic anticoagulation   NSVT- Amiodarone added Feb 2016   Acute on chronic systolic CHF (congestive heart failure)   DNR (do not resuscitate)   VT (ventricular tachycardia)   ICD discharge   HPI: The patient is a middle aged man with long standing non-ischemic CM, chronic class 3 systolic heart failure, VT, chronic renal insufficiency, stroke, PAF, morbid obesity and HTN. He was in his usual state of health when he had a syncopal event. He had 2 additional episodes in the ED and on tele was found to be in VT/VF. He was appropriately defibrillated. He notes a 5 lb weight gain prior to admit. He denies non-compliance. No prior episodes of syncope lately until yesterday.    CURRENT MEDS . acyclovir  400 mg Oral Daily  . amiodarone  200 mg Oral Daily  . aspirin EC  81 mg Oral Daily  . Chlorhexidine Gluconate Cloth  6 each Topical Q0600  . cyclobenzaprine  10 mg Oral BID  . levothyroxine  250 mcg Oral QAC breakfast  . metolazone  2.5 mg Oral Daily  . mupirocin ointment  1 application Nasal BID  . potassium chloride  40 mEq Oral Daily  . sodium chloride  3 mL Intravenous Q12H  . spironolactone  25 mg Oral Daily  . torsemide  60 mg Oral BID  . warfarin  12 mg Oral ONCE-1800  . warfarin   Does not apply Once  . Warfarin - Pharmacist Dosing Inpatient   Does not  apply q1800   Family history: no premature CAD  Social history: no ETOH or tobacco abuse  ROS - all systems reviewed and negative except as noted in the HPI.   Physical Exam  Filed Vitals:   03/31/14 0300 03/31/14 0400 03/31/14 0500 03/31/14 0700  BP: 117/39 120/71 135/72 103/47  Pulse: 79 84 77 72  Temp:  97 F (36.1 C)    TempSrc:  Axillary    Resp: Height:  (1.753 m)     Weight: 272 lb 14.9 oz (123.8 kg)     SpO2: 96% 94% 98% 84%    Intake/Output Summary (Last 24 hours) at 03/31/14 0923 Last data filed at 03/31/14 0800  Gross per 24 hour  Intake 2352.63 ml  Output   1350 ml  Net 1002.63 ml   Filed Weights   03/30/14 1341 03/31/14 0300  Weight: 268 lb 8.3 oz (121.8 kg) 272 lb 14.9 oz (123.8 kg)    General: Pleasant, obese middle aged man, coughing but in NAD. Neuro: Alert and oriented X 3. Moves all extremities spontaneously. Psych: Normal affect. HEENT:  Normal  Neck: Supple without bruits. 7 cm JVD Lungs:  Resp regular and unlabored, CTA. Heart: IRRR no s3, s4, or murmurs. Abdomen: Soft, non-tender, non-distended, BS + x 4.  Extremities:  No clubbing, cyanosis or edema. DP/PT/Radials 2+ and equal bilaterally.  Accessory Clinical Findings  CBC  Recent Labs  03/28/14 1827 03/30/14 1010  WBC 7.1 9.6  HGB 13.3 14.5  HCT 40.9 44.0  MCV 92.7 93.2  PLT 269 298   Basic Metabolic Panel  Recent Labs  03/30/14 1010 03/30/14 1618 03/31/14 0330  NA 136  --  134*  K 3.5 3.0* 2.9*  CL 92*  --  90*  CO2 30  --  33*  GLUCOSE 164*  --  148*  BUN 16  --  18  CREATININE 1.79*  --  2.16*  CALCIUM 9.4  --  8.8  MG 1.9  --  2.4   Liver Function Tests  Recent Labs  03/30/14 1010  AST 28  ALT 11  ALKPHOS 109  BILITOT 1.7*  PROT 7.2  ALBUMIN 3.7   No results for input(s): LIPASE, AMYLASE in the last 72 hours. Cardiac Enzymes  Recent Labs  03/30/14 1010  TROPONINI 0.15*   BNP Invalid input(s): POCBNP D-Dimer No results for  input(s): DDIMER in the last 72 hours. Hemoglobin A1C No results for input(s): HGBA1C in the last 72 hours. Fasting Lipid Panel No results for input(s): CHOL, HDL, LDLCALC, TRIG, CHOLHDL, LDLDIRECT in the last 72 hours. Thyroid Function Tests  Recent Labs  03/30/14 1618  TSH 2.222    TELE  nsr with frequent PVC's, one episode of VF  Radiology/Studies  Dg Chest 2 View  03/28/2014   CLINICAL DATA:  Shortness of breath since last night.  EXAM: CHEST  2 VIEW  COMPARISON:  02/18/2014  FINDINGS: Pacer/AICD device.  A right internal jugular line terminates over the internal jugular vein. Midline trachea. Moderate cardiomegaly. No pleural effusion or pneumothorax. Mild interstitial edema. No lobar consolidation.  IMPRESSION: Mild congestive heart failure.  Right internal jugular line with tip projecting over the low right neck. Presuming low SVC position is desired, this should be advanced approximately 11 cm.   Electronically Signed   By: Jeronimo Greaves M.D.   On: 03/28/2014 20:47   Dg Chest Port 1 View  03/30/2014   CLINICAL DATA:  54 year old male with a history of syncope. Report of AICD discharge  EXAM: PORTABLE CHEST - 1 VIEW  COMPARISON:  03/28/2014, fluoroscopy 02/21/2014  FINDINGS: Cardiomediastinal silhouette is similar to prior with cardiomegaly.  Central vascular congestion with right hilar opacities.  No pneumothorax.  No large pleural effusion. Retrocardiac region not well evaluated given the EKG leads, defibrillator pads, and overlying soft tissues of the chest wall.  Unchanged position of AICD generator on the left chest wall with 3 leads in place.  Right IJ tunneled central catheter has been withdrawn to the cervical internal jugular vein since the placement 02/21/2014.  IMPRESSION: Evidence of pulmonary vascular congestion/developing edema.  Cardiomegaly.  Interval withdrawal of the right IJ central venous catheter into the cervical internal jugular vein.  Overlying defibrillator pads.   Signed,  Yvone Neu. Loreta Ave, DO  [facility]  [facility]   Electronically Signed   By: Gilmer Mor D.O.   On: 03/30/2014 11:33   CXR - reviewed  ASSESSMENT AND PLAN  1. Recurrent VF 2. Acute on chronic systolic heart failure, on IV milrinone 3. Hypokalemia 4. Hypomagnesemia 5. Atrial fib 6. S/p BiV ICD with multiple appropriate discharges 7. Chronic cough Rec: Will continue IV amio, milrinone, and replete magnesium and potassium. Prognosis guarded  Legna Mausolf,M.D.  03/31/2014 9:23 AM

## 2014-04-01 LAB — BASIC METABOLIC PANEL
ANION GAP: 13 (ref 5–15)
Anion gap: 14 (ref 5–15)
BUN: 18 mg/dL (ref 6–23)
BUN: 20 mg/dL (ref 6–23)
CHLORIDE: 87 mmol/L — AB (ref 96–112)
CO2: 33 mmol/L — AB (ref 19–32)
CO2: 31 mmol/L (ref 19–32)
Calcium: 9.2 mg/dL (ref 8.4–10.5)
Calcium: 9.2 mg/dL (ref 8.4–10.5)
Chloride: 90 mmol/L — ABNORMAL LOW (ref 96–112)
Creatinine, Ser: 1.83 mg/dL — ABNORMAL HIGH (ref 0.50–1.35)
Creatinine, Ser: 1.78 mg/dL — ABNORMAL HIGH (ref 0.50–1.35)
GFR calc Af Amer: 48 mL/min — ABNORMAL LOW (ref >90)
GFR calc non Af Amer: 41 mL/min — ABNORMAL LOW (ref >90)
GFR, EST AFRICAN AMERICAN: 49 mL/min — AB (ref >90)
GFR, EST NON AFRICAN AMERICAN: 42 mL/min — AB (ref >90)
GLUCOSE: 135 mg/dL — AB (ref 70–99)
Glucose, Bld: 145 mg/dL — ABNORMAL HIGH (ref 70–99)
Potassium: 2.8 mmol/L — ABNORMAL LOW (ref 3.5–5.1)
Potassium: 3.1 mmol/L — ABNORMAL LOW (ref 3.5–5.1)
SODIUM: 136 mmol/L (ref 135–145)
SODIUM: 132 mmol/L — AB (ref 135–145)

## 2014-04-01 LAB — MAGNESIUM
MAGNESIUM: 1.8 mg/dL (ref 1.5–2.5)
Magnesium: 2 mg/dL (ref 1.5–2.5)

## 2014-04-01 LAB — PROTIME-INR
INR: 2.48 — ABNORMAL HIGH (ref 0.00–1.49)
Prothrombin Time: 27.1 seconds — ABNORMAL HIGH (ref 11.6–15.2)

## 2014-04-01 LAB — CARBOXYHEMOGLOBIN
Carboxyhemoglobin: 1.2 % (ref 0.5–1.5)
METHEMOGLOBIN: 1 % (ref 0.0–1.5)
O2 Saturation: 52.6 %
TOTAL HEMOGLOBIN: 12.2 g/dL — AB (ref 13.5–18.0)

## 2014-04-01 MED ORDER — CETYLPYRIDINIUM CHLORIDE 0.05 % MT LIQD
7.0000 mL | Freq: Two times a day (BID) | OROMUCOSAL | Status: DC
  Administered 2014-04-01 – 2014-04-03 (×5): 7 mL via OROMUCOSAL

## 2014-04-01 MED ORDER — POTASSIUM CHLORIDE CRYS ER 20 MEQ PO TBCR
60.0000 meq | EXTENDED_RELEASE_TABLET | Freq: Once | ORAL | Status: AC
  Administered 2014-04-01: 60 meq via ORAL
  Filled 2014-04-01: qty 3

## 2014-04-01 MED ORDER — POTASSIUM CHLORIDE ER 10 MEQ PO TBCR
60.0000 meq | EXTENDED_RELEASE_TABLET | Freq: Once | ORAL | Status: AC
  Administered 2014-04-01: 60 meq via ORAL
  Filled 2014-04-01: qty 6

## 2014-04-01 MED ORDER — ALPRAZOLAM 0.25 MG PO TABS
0.2500 mg | ORAL_TABLET | Freq: Two times a day (BID) | ORAL | Status: DC | PRN
Start: 2014-04-01 — End: 2014-04-09
  Administered 2014-04-01 – 2014-04-08 (×9): 0.25 mg via ORAL
  Filled 2014-04-01 (×9): qty 1

## 2014-04-01 MED ORDER — WARFARIN SODIUM 7.5 MG PO TABS
7.5000 mg | ORAL_TABLET | ORAL | Status: DC
  Administered 2014-04-01: 7.5 mg via ORAL
  Filled 2014-04-01: qty 1

## 2014-04-01 MED ORDER — SPIRONOLACTONE 25 MG PO TABS
25.0000 mg | ORAL_TABLET | Freq: Two times a day (BID) | ORAL | Status: DC
  Administered 2014-04-01 – 2014-04-08 (×16): 25 mg via ORAL
  Filled 2014-04-01 (×20): qty 1

## 2014-04-01 MED ORDER — POTASSIUM CHLORIDE ER 10 MEQ PO TBCR
40.0000 meq | EXTENDED_RELEASE_TABLET | Freq: Two times a day (BID) | ORAL | Status: DC
  Administered 2014-04-01: 40 meq via ORAL
  Filled 2014-04-01 (×2): qty 4

## 2014-04-01 MED ORDER — AMIODARONE LOAD VIA INFUSION
150.0000 mg | Freq: Once | INTRAVENOUS | Status: AC
Start: 2014-04-01 — End: 2014-04-01
  Administered 2014-04-01: 150 mg via INTRAVENOUS
  Filled 2014-04-01: qty 83.34

## 2014-04-01 MED ORDER — MAGNESIUM SULFATE 50 % IJ SOLN
3.0000 g | Freq: Once | INTRAVENOUS | Status: AC
  Administered 2014-04-01: 3 g via INTRAVENOUS
  Filled 2014-04-01: qty 6

## 2014-04-01 MED ORDER — POTASSIUM CHLORIDE CRYS ER 20 MEQ PO TBCR
40.0000 meq | EXTENDED_RELEASE_TABLET | Freq: Once | ORAL | Status: AC
  Administered 2014-04-01: 40 meq via ORAL
  Filled 2014-04-01: qty 2

## 2014-04-01 MED ORDER — POTASSIUM CHLORIDE ER 10 MEQ PO TBCR
40.0000 meq | EXTENDED_RELEASE_TABLET | Freq: Three times a day (TID) | ORAL | Status: DC
  Filled 2014-04-01 (×3): qty 4

## 2014-04-01 MED ORDER — POTASSIUM CHLORIDE 10 MEQ/50ML IV SOLN
10.0000 meq | INTRAVENOUS | Status: AC
  Administered 2014-04-01 (×4): 10 meq via INTRAVENOUS
  Filled 2014-04-01 (×3): qty 50

## 2014-04-01 MED ORDER — WARFARIN SODIUM 10 MG PO TABS
11.2500 mg | ORAL_TABLET | ORAL | Status: DC
  Filled 2014-04-01: qty 1

## 2014-04-01 MED ORDER — POTASSIUM CHLORIDE 10 MEQ/50ML IV SOLN
INTRAVENOUS | Status: AC
  Filled 2014-04-01: qty 50

## 2014-04-01 NOTE — Progress Notes (Signed)
Pharmacist Heart Failure Core Measure Documentation  Assessment: Cardier Koob has an EF documented as 20-25% on 02/15/14 by Echo.  Rationale: Heart failure patients with left ventricular systolic dysfunction (LVSD) and an EF < 40% should be prescribed an angiotensin converting enzyme inhibitor (ACEI) or angiotensin receptor blocker (ARB) at discharge unless a contraindication is documented in the medical record.  This patient is not currently on an ACEI or ARB for HF.  This note is being placed in the record in order to provide documentation that a contraindication to the use of these agents is present for this encounter.  ACE Inhibitor or Angiotensin Receptor Blocker is contraindicated (specify all that apply)  []   ACEI allergy AND ARB allergy []   Angioedema []   Moderate or severe aortic stenosis []   Hyperkalemia []   Hypotension []   Renal artery stenosis [x]   Worsening renal function, preexisting renal disease or dysfunction  Harland German, Pharm D 04/01/2014 11:52 AM

## 2014-04-01 NOTE — Care Management Note (Addendum)
    Page 1 of 1   04/08/2014     11:24:55 AM CARE MANAGEMENT NOTE 04/08/2014  Patient:  Raymond [name], Raymond [name]   Account Number:  [id number]  Date Initiated:  04/01/2014  Documentation initiated by:  Junius Creamer  Subjective/Objective Assessment:   adm w heart failure     Action/Plan:   lives at home, act w [facility] for home milrinone   Anticipated DC Date:     Anticipated DC Plan:  HOME W HOME HEALTH SERVICES         [facility] Choice  Resumption Of Svcs/PTA Provider  IP REHAB   Choice offered to / List presented to:          [facility] arranged  HH-1 RN  HH-10 DISEASE MANAGEMENT      HH agency  [facility]   Status of service:   Medicare Important Message given?  YES (If response is "NO", the following Medicare IM given date fields will be blank) Date Medicare IM given:  04/08/2014 Medicare IM given by:  Junius Creamer Date Additional Medicare IM given:  04/05/2014 Additional Medicare IM given by:  Junius Creamer  Discharge Disposition:  HOME [facility] SERVICES  Per UR Regulation:  Reviewed for med. necessity/level of care/duration of stay  If discussed at Long Length of Stay Meetings, dates discussed:   04/04/2014  04/09/2014    Comments:  3/21 1123 debbie Raymond Edmondson rn,bsn cir looking into rehab for pt. per md very weak. [facility] aware pt in hosp and resume home milrinone whenever dc home.

## 2014-04-01 NOTE — Progress Notes (Signed)
Patient ID: Raymond [name], male   DOB: [birth year], 54 y.o.   MRN: [id number]   54 yo with history of nonischemic cardiomyopathy, EF 10-15% by last echo and St Jude CRT-D device, chronic atrial fibrillation s/p AV nodal ablation, h/o VT, CKD, CVA presented with VT/VF and syncope.  At last office visit, torsemide was increased and amiodarone was decreased to 200 daily.  He was hypokalemic at admission.    Since admission, he was shocked for VT on the evening of 3/12 and again on the evening of 3/13 (last night).  He got metolazone on 3/13 and is hypokalemic this morning.  He diuresed well yesterday, weight down 5 lbs.    Scheduled Meds: . acyclovir  400 mg Oral Daily  . Chlorhexidine Gluconate Cloth  6 each Topical Q0600  . cyclobenzaprine  10 mg Oral BID  . levothyroxine  250 mcg Oral QAC breakfast  . mupirocin ointment  1 application Nasal BID  . potassium chloride  40 mEq Oral BID  . potassium chloride  10 mEq Intravenous Q1 Hr x 4  . potassium chloride      . potassium chloride  40 mEq Oral Once  . sodium chloride  3 mL Intravenous Q12H  . spironolactone  25 mg Oral BID  . torsemide  60 mg Oral BID  . [START ON 04/02/2014] warfarin  11.25 mg Oral Q T,Th,S,Su-1800  . warfarin  7.5 mg Oral Q M,W,F-1800  . Warfarin - Pharmacist Dosing Inpatient   Does not apply q1800   Continuous Infusions: . sodium chloride Stopped (04/01/14 0430)  . amiodarone 30 mg/hr (04/01/14 6468)  . milrinone 0.375 mcg/kg/min (04/01/14 0718)   PRN Meds:.sodium chloride, acetaminophen, ALPRAZolam, guaiFENesin-codeine, guaiFENesin-dextromethorphan, nitroGLYCERIN, ondansetron (ZOFRAN) IV, sodium chloride   Filed Vitals:   04/01/14 0240 04/01/14 0415 04/01/14 0500 04/01/14 0618  BP: 123/85 145/110 146/89 147/78  Pulse: 79 67 53 86  Temp:  98.7 F (37.1 C)    TempSrc:  Axillary    Resp: 31 29 30 17   Height:      Weight:   267 lb 3.2 oz (121.2 kg)   SpO2: 93% 95% 92% 100%    Intake/Output Summary (Last 24  hours) at 04/01/14 0744 Last data filed at 04/01/14 0700  Gross per 24 hour  Intake 2000.97 ml  Output   6000 ml  Net -3999.03 ml    LABS: Basic Metabolic Panel:  Recent Labs  04/07/20 0330 03/31/14 1640 04/01/14 0323  NA 134* 135 136  K 2.9* 3.1* 2.8*  CL 90* 90* 90*  CO2 33* 34* 33*  GLUCOSE 148* 148* 135*  BUN 18 20 18   CREATININE 2.16* 1.97* 1.83*  CALCIUM 8.8 9.1 9.2  MG 2.4  --  2.0   Liver Function Tests:  Recent Labs  03/30/14 1010  AST 28  ALT 11  ALKPHOS 109  BILITOT 1.7*  PROT 7.2  ALBUMIN 3.7   No results for input(s): LIPASE, AMYLASE in the last 72 hours. CBC:  Recent Labs  03/30/14 1010  WBC 9.6  HGB 14.5  HCT 44.0  MCV 93.2  PLT 298   Cardiac Enzymes:  Recent Labs  03/30/14 1010  TROPONINI 0.15*   BNP: Invalid input(s): POCBNP D-Dimer: No results for input(s): DDIMER in the last 72 hours. Hemoglobin A1C: No results for input(s): HGBA1C in the last 72 hours. Fasting Lipid Panel: No results for input(s): CHOL, HDL, LDLCALC, TRIG, CHOLHDL, LDLDIRECT in the last 72 hours. Thyroid Function Tests:  Recent Labs  03/30/14 1618  TSH 2.222   Anemia Panel: No results for input(s): VITAMINB12, FOLATE, FERRITIN, TIBC, IRON, RETICCTPCT in the last 72 hours.  RADIOLOGY: Dg Chest 2 View  03/28/2014   CLINICAL DATA:  Shortness of breath since last night.  EXAM: CHEST  2 VIEW  COMPARISON:  02/18/2014  FINDINGS: Pacer/AICD device.  A right internal jugular line terminates over the internal jugular vein. Midline trachea. Moderate cardiomegaly. No pleural effusion or pneumothorax. Mild interstitial edema. No lobar consolidation.  IMPRESSION: Mild congestive heart failure.  Right internal jugular line with tip projecting over the low right neck. Presuming low SVC position is desired, this should be advanced approximately 11 cm.   Electronically Signed   By: Jeronimo Greaves M.D.   On: 03/28/2014 20:47   Dg Chest Port 1 View  03/30/2014   CLINICAL  DATA:  54 year old male with a history of syncope. Report of AICD discharge  EXAM: PORTABLE CHEST - 1 VIEW  COMPARISON:  03/28/2014, fluoroscopy 02/21/2014  FINDINGS: Cardiomediastinal silhouette is similar to prior with cardiomegaly.  Central vascular congestion with right hilar opacities.  No pneumothorax.  No large pleural effusion. Retrocardiac region not well evaluated given the EKG leads, defibrillator pads, and overlying soft tissues of the chest wall.  Unchanged position of AICD generator on the left chest wall with 3 leads in place.  Right IJ tunneled central catheter has been withdrawn to the cervical internal jugular vein since the placement 02/21/2014.  IMPRESSION: Evidence of pulmonary vascular congestion/developing edema.  Cardiomegaly.  Interval withdrawal of the right IJ central venous catheter into the cervical internal jugular vein.  Overlying defibrillator pads.  Signed,  Yvone Neu. Loreta Ave, DO  [facility]  [facility]iology   Electronically Signed   By: Gilmer Mor D.O.   On: 03/30/2014 11:33    PHYSICAL EXAM General: NAD Neck: No JVD, no thyromegaly or thyroid nodule.  Lungs: Clear to auscultation bilaterally with normal respiratory effort. CV: Nondisplaced PMI.  Heart irregular S1/S2 (frequent PVCs), no S3/S4, no murmur.  No peripheral edema.  No carotid bruit.  Normal pedal pulses.  Abdomen: Soft, nontender, no hepatosplenomegaly, no distention.  Neurologic: Alert and oriented x 3.  Psych: Normal affect. Extremities: No clubbing or cyanosis.   TELEMETRY: Reviewed telemetry pt in suspect underlying atrial fibrillation with v-pacing, frequent PVCs  ASSESSMENT AND PLAN: 54 yo with history of nonischemic cardiomyopathy, EF 10-15% by last echo and St Jude CRT-D device, chronic atrial fibrillation s/p AV nodal ablation, h/o VT, CKD, CVA presented with VT/VF and syncope.  1. VT: Multiple episodes recently.  At last office visit, amiodarone was  decreased and torsemide was increased.  He has been hypokalemic and is again hypokalemic this morning.  He had VT last night terminated by shock.  Seen by EP yesterday.  - Continue amiodarone gtt today, bolus 150 mg IV.  He will need to go back to 200 mg bid at home.  - K still low, replete aggressively today.  Will need higher dose at home, especially when he takes metolazone.  - Increase spironolactone to 25 mg bid.  2. Acute on chronic systolic CHF: EF 16-10% on 1/16 echo, nonischemic cardiomyopathy.  St Jude CRT-D device.  Not good candidate for advanced therapies (lives alone, etc). He diuresed well yesterday (got metolazone) and is near-euvolemic today.   - Continue torsemide 60 bid.  No metolazone today.  - Continue home milrinone at 0.375.  - Follow CVPs, will get  coox.  3. Atrial fibrillation: Chronic, s/p AVN ablation.  Continue warfarin.  4. CKD: Creatinine stable at his baseline.   Marca Ancona 04/01/2014 7:54 AM

## 2014-04-01 NOTE — Progress Notes (Signed)
ANTICOAGULATION CONSULT NOTE - Follow-Up Consult  Pharmacy Consult for warfarin Indication: atrial fibrillation and stroke  Allergies  Allergen Reactions  . Valsartan Cough    Patient Measurements: Height: 5\' 9"  (175.3 cm) Weight: 267 lb 3.2 oz (121.2 kg) IBW/kg (Calculated) : 70.7  Vital Signs: Temp: 98.7 F (37.1 C) (03/14 0415) Temp Source: Axillary (03/14 0415) BP: 147/78 mmHg (03/14 0618) Pulse Rate: 86 (03/14 0618)  Labs:  Recent Labs  03/30/14 1010 03/31/14 0330 03/31/14 1640 04/01/14 0323  HGB 14.5  --   --   --   HCT 44.0  --   --   --   PLT 298  --   --   --   LABPROT 19.7* 23.3*  --  27.1*  INR 1.65* 2.05*  --  2.48*  CREATININE 1.79* 2.16* 1.97* 1.83*  TROPONINI 0.15*  --   --   --     Estimated Creatinine Clearance: 61.4 mL/min (by C-G formula based on Cr of 1.83).   Medical History: Past Medical History  Diagnosis Date  . Non-ischemic cardiomyopathy     a. echo 11/10: EF 15%;   b. cath 10/08: normal cors;  c. Myoview 5/12: Very mild distal ant and apical isch, EF 17% (low risk-medical Rx cont'd);  d.  Echo 4/14:  EF 15%;  e. 05/2009 s/p SJM BiV ICD;  f. 07/2012 TEE: EF 20-25%, diff HK mild MR, mod TR.   Marland Kitchen Atrial fibrillation     a. on coumadin;  b. 07/2012 s/p TEE/DCCV. c. Recurrence 08/2012 in setting of abnormal thyroid panel.; 12/2012 AVN ablation by Dr [name]  . Hypertension   . Obesity   . Non-compliance   . Dyslexia     unable to read.  Marland Kitchen History of CVA (cerebrovascular accident) 11/2008    a. 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 RBBB (right bundle branch block)   . Cough syncope     a. During 08/2012 adm.  . Systolic CHF, chronic 01/19/1996  . Hypothyroidism 01/19/1996    s/p RAI therapy  . Stroke 01/19/2008  . Renal insufficiency    Assessment: 54yo male presented to the ED after a syncopal episode. He is on chronic coumadin for history of afib and CVA. INR on admit was subtherapeutic at 1.65 > now therapeutic at 2.48.  CBC is WNL, no bleeding reported. He is also on chronic amiodarone.   PTA dose is 7.5mg  MWF, 11.25mg  all other days (7.5mg  tabs)  Goal of Therapy:  INR 2-3 Monitor platelets by anticoagulation protocol: Yes   Plan:  Restart home dose warfarin 7.5mg  MWF, 11.25mg  TTSS - Monitor INR, CBC, s/sx of bleeding   Leota Sauers Pharm.D. CPP, BCPS Clinical Pharmacist [phone] 04/01/2014 7:49 AM

## 2014-04-02 ENCOUNTER — Inpatient Hospital Stay (HOSPITAL_COMMUNITY): Payer: Medicare Other

## 2014-04-02 ENCOUNTER — Encounter (HOSPITAL_COMMUNITY): Payer: Medicare Other

## 2014-04-02 LAB — BASIC METABOLIC PANEL
Anion gap: 9 (ref 5–15)
BUN: 20 mg/dL (ref 6–23)
CHLORIDE: 86 mmol/L — AB (ref 96–112)
CO2: 38 mmol/L — AB (ref 19–32)
Calcium: 8.9 mg/dL (ref 8.4–10.5)
Creatinine, Ser: 1.87 mg/dL — ABNORMAL HIGH (ref 0.50–1.35)
GFR calc Af Amer: 46 mL/min — ABNORMAL LOW (ref >90)
GFR calc non Af Amer: 40 mL/min — ABNORMAL LOW (ref >90)
GLUCOSE: 148 mg/dL — AB (ref 70–99)
Potassium: 3.4 mmol/L — ABNORMAL LOW (ref 3.5–5.1)
Sodium: 133 mmol/L — ABNORMAL LOW (ref 135–145)

## 2014-04-02 LAB — CARBOXYHEMOGLOBIN
Carboxyhemoglobin: 1.1 % (ref 0.5–1.5)
METHEMOGLOBIN: 0.8 % (ref 0.0–1.5)
O2 SAT: 55 %
TOTAL HEMOGLOBIN: 16.8 g/dL (ref 13.5–18.0)

## 2014-04-02 LAB — CBC
HEMATOCRIT: 41.9 % (ref 39.0–52.0)
HEMOGLOBIN: 14 g/dL (ref 13.0–17.0)
MCH: 30.7 pg (ref 26.0–34.0)
MCHC: 33.4 g/dL (ref 30.0–36.0)
MCV: 91.9 fL (ref 78.0–100.0)
Platelets: 269 10*3/uL (ref 150–400)
RBC: 4.56 MIL/uL (ref 4.22–5.81)
RDW: 15.2 % (ref 11.5–15.5)
WBC: 9.8 10*3/uL (ref 4.0–10.5)

## 2014-04-02 LAB — GLUCOSE, CAPILLARY: GLUCOSE-CAPILLARY: 134 mg/dL — AB (ref 70–99)

## 2014-04-02 LAB — PROTIME-INR
INR: 3.61 — AB (ref 0.00–1.49)
PROTHROMBIN TIME: 36.3 s — AB (ref 11.6–15.2)

## 2014-04-02 LAB — MAGNESIUM: Magnesium: 2.4 mg/dL (ref 1.5–2.5)

## 2014-04-02 MED ORDER — SODIUM CHLORIDE 0.9 % IJ SOLN
10.0000 mL | INTRAMUSCULAR | Status: DC | PRN
  Administered 2014-04-09: 10 mL
  Filled 2014-04-02: qty 40

## 2014-04-02 MED ORDER — MAGNESIUM OXIDE 400 (241.3 MG) MG PO TABS
400.0000 mg | ORAL_TABLET | Freq: Every day | ORAL | Status: DC
  Administered 2014-04-03 – 2014-04-09 (×7): 400 mg via ORAL
  Filled 2014-04-02 (×7): qty 1

## 2014-04-02 MED ORDER — TORSEMIDE 20 MG PO TABS
80.0000 mg | ORAL_TABLET | Freq: Two times a day (BID) | ORAL | Status: DC
  Administered 2014-04-02 – 2014-04-03 (×3): 80 mg via ORAL
  Filled 2014-04-02 (×6): qty 4

## 2014-04-02 MED ORDER — POTASSIUM CHLORIDE CRYS ER 20 MEQ PO TBCR
40.0000 meq | EXTENDED_RELEASE_TABLET | Freq: Once | ORAL | Status: AC
  Administered 2014-04-02: 40 meq via ORAL
  Filled 2014-04-02: qty 2

## 2014-04-02 MED ORDER — AMIODARONE HCL 200 MG PO TABS
400.0000 mg | ORAL_TABLET | Freq: Two times a day (BID) | ORAL | Status: DC
  Administered 2014-04-02 – 2014-04-04 (×5): 400 mg via ORAL
  Filled 2014-04-02 (×6): qty 2

## 2014-04-02 MED ORDER — MORPHINE SULFATE 2 MG/ML IJ SOLN
2.0000 mg | INTRAMUSCULAR | Status: AC | PRN
  Administered 2014-04-02 (×2): 2 mg via INTRAVENOUS
  Filled 2014-04-02 (×2): qty 1

## 2014-04-02 MED ORDER — POTASSIUM CHLORIDE CRYS ER 20 MEQ PO TBCR
60.0000 meq | EXTENDED_RELEASE_TABLET | Freq: Two times a day (BID) | ORAL | Status: DC
  Administered 2014-04-02 – 2014-04-09 (×14): 60 meq via ORAL
  Filled 2014-04-02 (×18): qty 3

## 2014-04-02 MED ORDER — SODIUM CHLORIDE 0.9 % IJ SOLN
10.0000 mL | Freq: Two times a day (BID) | INTRAMUSCULAR | Status: DC
  Administered 2014-04-02: 20 mL
  Administered 2014-04-02 – 2014-04-08 (×8): 10 mL

## 2014-04-02 NOTE — Progress Notes (Signed)
Peripherally Inserted Central Catheter/Midline Placement  The IV Nurse has discussed with the patient and/or persons authorized to consent for the patient, the purpose of this procedure and the potential benefits and risks involved with this procedure.  The benefits include less needle sticks, lab draws from the catheter and patient may be discharged home with the catheter.  Risks include, but not limited to, infection, bleeding, blood clot (thrombus formation), and puncture of an artery; nerve damage and irregular heat beat.  Alternatives to this procedure were also discussed.  PICC/Midline Placement Documentation  PICC / Midline Single Lumen 02/21/14 Right Other (Comment) 25.5 cm (Active)  Indication for Insertion or Continuance of Line Home intravenous therapies (PICC only) 04/01/2014  9:00 PM  Site Assessment Clean;Dry;Intact 04/01/2014  9:00 PM  Line Status Infusing 04/01/2014  9:00 PM  Dressing Type Transparent;Occlusive 04/01/2014  9:00 PM  Dressing Status Clean;Dry;Intact;Antimicrobial disc in place 04/01/2014  9:00 PM  Line Care Connections checked and tightened 04/01/2014  9:00 PM  Dressing Intervention Dressing changed;Antimicrobial disc changed 04/01/2014  2:00 AM  Dressing Change Due 04/08/14 04/01/2014  2:00 AM       Raymond [name] 04/02/2014, 10:50 AM

## 2014-04-02 NOTE — Progress Notes (Signed)
Pt complaining of R foot pain concerned about fracture/dislocation since he had a syncopal episode PTA-notified Dr Mayford Knife w/ order for xray.

## 2014-04-02 NOTE — Progress Notes (Signed)
Unable to draw back blood on PICC line. 2nd nurse attempted as well. Unable to get coox lab. Called phlebotomy to collect daily labs peripherally.

## 2014-04-02 NOTE — Progress Notes (Signed)
Patient ID: Raymond [name], male   DOB: 11-29-1960, 54 y.o.   MRN: [id number]   55 yo with history of nonischemic cardiomyopathy, EF 10-15% by last echo and St Jude CRT-D device, chronic atrial fibrillation s/p AV nodal ablation, h/o VT, CKD, CVA presented with VT/VF and syncope.  At last office visit, torsemide was increased and amiodarone was decreased to 200 daily.  He was hypokalemic at admission.    Since admission, he was shocked for VT on the evening of 3/12 and again on the evening of 3/13.  He got metolazone on 3/13 and was hypokalemic afterwards.  K is better today but still low.  He had a run of about 20 beats NSVT last night.  CVP 12.   Scheduled Meds: . acyclovir  400 mg Oral Daily  . amiodarone  400 mg Oral BID  . antiseptic oral rinse  7 mL Mouth Rinse BID  . Chlorhexidine Gluconate Cloth  6 each Topical Q0600  . cyclobenzaprine  10 mg Oral BID  . levothyroxine  250 mcg Oral QAC breakfast  . [START ON 04/03/2014] magnesium oxide  400 mg Oral Daily  . mupirocin ointment  1 application Nasal BID  . potassium chloride  40 mEq Oral Once  . potassium chloride  40 mEq Oral Once  . potassium chloride  60 mEq Oral BID  . sodium chloride  3 mL Intravenous Q12H  . spironolactone  25 mg Oral BID  . torsemide  80 mg Oral BID  . warfarin  11.25 mg Oral Q T,Th,S,Su-1800  . warfarin  7.5 mg Oral Q M,W,F-1800  . Warfarin - Pharmacist Dosing Inpatient   Does not apply q1800   Continuous Infusions: . sodium chloride Stopped (04/01/14 0430)  . milrinone 0.375 mcg/kg/min (04/02/14 0420)   PRN Meds:.sodium chloride, acetaminophen, ALPRAZolam, guaiFENesin-codeine, guaiFENesin-dextromethorphan, nitroGLYCERIN, ondansetron (ZOFRAN) IV, sodium chloride   Filed Vitals:   04/02/14 0400 04/02/14 0500 04/02/14 0700 04/02/14 0758  BP: 126/79 118/74 133/71 131/98  Pulse: 70 74 68 71  Temp: 98 F (36.7 C)   97 F (36.1 C)  TempSrc: Axillary   Oral  Resp: 22 29 32 16  Height:      Weight:  267  lb 9.6 oz (121.383 kg)    SpO2: 90% 98% 91% 93%    Intake/Output Summary (Last 24 hours) at 04/02/14 0829 Last data filed at 04/02/14 0700  Gross per 24 hour  Intake 2147.6 ml  Output   2600 ml  Net -452.4 ml    LABS: Basic Metabolic Panel:  Recent Labs  67/34/19 1420 04/02/14 0710  NA 132* 133*  K 3.1* 3.4*  CL 87* 86*  CO2 31 38*  GLUCOSE 145* 148*  BUN 20 20  CREATININE 1.78* 1.87*  CALCIUM 9.2 8.9  MG 1.8 2.4   Liver Function Tests:  Recent Labs  03/30/14 1010  AST 28  ALT 11  ALKPHOS 109  BILITOT 1.7*  PROT 7.2  ALBUMIN 3.7   No results for input(s): LIPASE, AMYLASE in the last 72 hours. CBC:  Recent Labs  03/30/14 1010 04/02/14 0710  WBC 9.6 9.8  HGB 14.5 14.0  HCT 44.0 41.9  MCV 93.2 91.9  PLT 298 269   Cardiac Enzymes:  Recent Labs  03/30/14 1010  TROPONINI 0.15*   BNP: Invalid input(s): POCBNP D-Dimer: No results for input(s): DDIMER in the last 72 hours. Hemoglobin A1C: No results for input(s): HGBA1C in the last 72 hours. Fasting Lipid Panel: No results  for input(s): CHOL, HDL, LDLCALC, TRIG, CHOLHDL, LDLDIRECT in the last 72 hours. Thyroid Function Tests:  Recent Labs  03/30/14 1618  TSH 2.222   Anemia Panel: No results for input(s): VITAMINB12, FOLATE, FERRITIN, TIBC, IRON, RETICCTPCT in the last 72 hours.  RADIOLOGY: Dg Chest 2 View  03/28/2014   CLINICAL DATA:  Shortness of breath since last night.  EXAM: CHEST  2 VIEW  COMPARISON:  02/18/2014  FINDINGS: Pacer/AICD device.  A right internal jugular line terminates over the internal jugular vein. Midline trachea. Moderate cardiomegaly. No pleural effusion or pneumothorax. Mild interstitial edema. No lobar consolidation.  IMPRESSION: Mild congestive heart failure.  Right internal jugular line with tip projecting over the low right neck. Presuming low SVC position is desired, this should be advanced approximately 11 cm.   Electronically Signed   By: Jeronimo Greaves M.D.   On:  03/28/2014 20:47   Dg Chest Port 1 View  03/30/2014   CLINICAL DATA:  54 year old male with a history of syncope. Report of AICD discharge  EXAM: PORTABLE CHEST - 1 VIEW  COMPARISON:  03/28/2014, fluoroscopy 02/21/2014  FINDINGS: Cardiomediastinal silhouette is similar to prior with cardiomegaly.  Central vascular congestion with right hilar opacities.  No pneumothorax.  No large pleural effusion. Retrocardiac region not well evaluated given the EKG leads, defibrillator pads, and overlying soft tissues of the chest wall.  Unchanged position of AICD generator on the left chest wall with 3 leads in place.  Right IJ tunneled central catheter has been withdrawn to the cervical internal jugular vein since the placement 02/21/2014.  IMPRESSION: Evidence of pulmonary vascular congestion/developing edema.  Cardiomegaly.  Interval withdrawal of the right IJ central venous catheter into the cervical internal jugular vein.  Overlying defibrillator pads.  Signed,  Yvone Neu. Loreta Ave, DO  [facility]  [facility]iology   Electronically Signed   By: Gilmer Mor D.O.   On: 03/30/2014 11:33    PHYSICAL EXAM General: NAD Neck: JVP 10 cm, no thyromegaly or thyroid nodule.  Lungs: Clear to auscultation bilaterally with normal respiratory effort. CV: Nondisplaced PMI.  Heart irregular S1/S2 (frequent PVCs), no S3/S4, no murmur.  No peripheral edema.  No carotid bruit.  Normal pedal pulses.  Abdomen: Soft, nontender, no hepatosplenomegaly, no distention.  Neurologic: Alert and oriented x 3.  Psych: Normal affect. Extremities: No clubbing or cyanosis.   TELEMETRY: Reviewed telemetry pt in suspect underlying atrial fibrillation with v-pacing, frequent PVCs  ASSESSMENT AND PLAN: 54 yo with history of nonischemic cardiomyopathy, EF 10-15% by last echo and St Jude CRT-D device, chronic atrial fibrillation s/p AV nodal ablation, h/o VT, CKD, CVA presented with VT/VF and syncope.  1.  VT: Multiple episodes recently.  At last office visit, amiodarone was decreased and torsemide was increased.  He has been hypokalemic and is again hypokalemic this morning.  Had NSVT last night.   - Transition to amiodarone 400 mg bid today, will aim for home on 200 mg bid.  - K still low, replete aggressively today.  Will need higher dose at home, especially when he takes metolazone.  - Spironolactone increased to 25 mg bid.  2. Acute on chronic systolic CHF: EF 16-10% on 1/16 echo, nonischemic cardiomyopathy.  St Jude CRT-D device.  Not good candidate for advanced therapies (lives alone, etc). CVP 12, remains somewhat volume overloaded.   - I will increase torsemide to 80 mg bid chronically, hopefully this will allow [place] to use metolazone less.   -  Continue home milrinone at 0.375. PICC line not drawing, will have PICC team evaluate.  - Follow CVPs, will get coox when PICC drawing back.  3. Atrial fibrillation: Chronic, s/p AVN ablation.  Continue warfarin.  4. CKD: Creatinine stable at his baseline (slightly up from yesterday).   Watch in step-down today given NSVT, probably home tomorrow or the next day if VT and K controlled.   Marca Ancona 04/02/2014 8:29 AM

## 2014-04-02 NOTE — Progress Notes (Signed)
Patient has been active with [facility] Care Management for chronic disease management services.  Patient has been engaged by a [employer] and LCSW.  Our community based plan of care has focused on disease management and community resource support.  Patient will receive a post discharge transition of care call and will be evaluated for monthly home visits for assessments and disease process education.  Made Inpatient Case Manager aware that [facility] Care Management following. Of note, [facility] Care Management services does not replace or interfere with any services that are arranged by inpatient case management or social work.  For additional questions or referrals please contact: Charlesetta Shanks, RN BSN CCM [facility]  [phone] business mobile phone

## 2014-04-02 NOTE — Progress Notes (Signed)
ANTICOAGULATION CONSULT NOTE - Follow-Up Consult  Pharmacy Consult for warfarin Indication: atrial fibrillation and stroke  Allergies  Allergen Reactions  . Valsartan Cough    Patient Measurements: Height: 5\' 9"  (175.3 cm) Weight: 267 lb 9.6 oz (121.383 kg) IBW/kg (Calculated) : 70.7  Vital Signs: Temp: 97.8 F (36.6 C) (03/15 1000) Temp Source: Oral (03/15 1000) BP: 147/75 mmHg (03/15 1200) Pulse Rate: 75 (03/15 1200)  Labs:  Recent Labs  03/31/14 0330  04/01/14 0323 04/01/14 1420 04/02/14 0710  HGB  --   --   --   --  14.0  HCT  --   --   --   --  41.9  PLT  --   --   --   --  269  LABPROT 23.3*  --  27.1*  --  36.3*  INR 2.05*  --  2.48*  --  3.61*  CREATININE 2.16*  < > 1.83* 1.78* 1.87*  < > = values in this interval not displayed.  Estimated Creatinine Clearance: 60.2 mL/min (by C-G formula based on Cr of 1.87).   Medical History: Past Medical History  Diagnosis Date  . Non-ischemic cardiomyopathy     a. echo 11/10: EF 15%;   b. cath 10/08: normal cors;  c. Myoview 5/12: Very mild distal ant and apical isch, EF 17% (low risk-medical Rx cont'd);  d.  Echo 4/14:  EF 15%;  e. 05/2009 s/p SJM BiV ICD;  f. 07/2012 TEE: EF 20-25%, diff HK mild MR, mod TR.   Marland Kitchen Atrial fibrillation     a. on coumadin;  b. 07/2012 s/p TEE/DCCV. c. Recurrence 08/2012 in setting of abnormal thyroid panel.; 12/2012 AVN ablation by Dr [name]  . Hypertension   . Obesity   . Non-compliance   . Dyslexia     unable to read.  Marland Kitchen History of CVA (cerebrovascular accident) 11/2008    a. 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 RBBB (right bundle branch block)   . Cough syncope     a. During 08/2012 adm.  . Systolic CHF, chronic 01/19/1996  . Hypothyroidism 01/19/1996    s/p RAI therapy  . Stroke 01/19/2008  . Renal insufficiency    Assessment: 54yo male presented to the ED after a syncopal episode. He is on chronic coumadin for history of afib and CVA. INR on admit was  subtherapeutic at 1.65 > now supratherapeutic, although he's only had doses slightly greater than his home doses. CBC is WNL, no bleeding reported. He is also on chronic amiodarone.   PTA dose is 7.5mg  MWF, 11.25mg  all other days (7.5mg  tabs)  Goal of Therapy:  INR 2-3 Monitor platelets by anticoagulation protocol: Yes   Plan:  -Hold Coumadin tonight. - Monitor INR, CBC, s/sx of bleeding   Tad Moore, BCPS  Clinical Pharmacist Pager [phone]  04/02/2014 1:48 PM

## 2014-04-03 ENCOUNTER — Telehealth (HOSPITAL_COMMUNITY): Payer: Self-pay | Admitting: Vascular Surgery

## 2014-04-03 LAB — BASIC METABOLIC PANEL
ANION GAP: 6 (ref 5–15)
BUN: 25 mg/dL — ABNORMAL HIGH (ref 6–23)
CALCIUM: 9 mg/dL (ref 8.4–10.5)
CO2: 36 mmol/L — ABNORMAL HIGH (ref 19–32)
Chloride: 89 mmol/L — ABNORMAL LOW (ref 96–112)
Creatinine, Ser: 1.61 mg/dL — ABNORMAL HIGH (ref 0.50–1.35)
GFR calc Af Amer: 56 mL/min — ABNORMAL LOW (ref >90)
GFR calc non Af Amer: 48 mL/min — ABNORMAL LOW (ref >90)
Glucose, Bld: 157 mg/dL — ABNORMAL HIGH (ref 70–99)
Potassium: 4.1 mmol/L (ref 3.5–5.1)
SODIUM: 131 mmol/L — AB (ref 135–145)

## 2014-04-03 LAB — PROTIME-INR
INR: 3.52 — ABNORMAL HIGH (ref 0.00–1.49)
Prothrombin Time: 35.6 seconds — ABNORMAL HIGH (ref 11.6–15.2)

## 2014-04-03 LAB — MAGNESIUM: Magnesium: 2.4 mg/dL (ref 1.5–2.5)

## 2014-04-03 LAB — CARBOXYHEMOGLOBIN
CARBOXYHEMOGLOBIN: 1.4 % (ref 0.5–1.5)
Methemoglobin: 0.9 % (ref 0.0–1.5)
O2 Saturation: 56.8 %
TOTAL HEMOGLOBIN: 13.9 g/dL (ref 13.5–18.0)

## 2014-04-03 MED ORDER — MORPHINE SULFATE 2 MG/ML IJ SOLN
2.0000 mg | Freq: Once | INTRAMUSCULAR | Status: AC
  Administered 2014-04-03: 2 mg via INTRAVENOUS
  Filled 2014-04-03: qty 1

## 2014-04-03 MED ORDER — POTASSIUM CHLORIDE CRYS ER 20 MEQ PO TBCR
40.0000 meq | EXTENDED_RELEASE_TABLET | Freq: Once | ORAL | Status: AC
  Administered 2014-04-03: 40 meq via ORAL

## 2014-04-03 MED ORDER — METOLAZONE 2.5 MG PO TABS
2.5000 mg | ORAL_TABLET | Freq: Once | ORAL | Status: AC
  Administered 2014-04-03: 2.5 mg via ORAL
  Filled 2014-04-03: qty 1

## 2014-04-03 MED ORDER — OXYCODONE-ACETAMINOPHEN 5-325 MG PO TABS
1.0000 | ORAL_TABLET | Freq: Four times a day (QID) | ORAL | Status: DC | PRN
  Administered 2014-04-03: 1 via ORAL
  Administered 2014-04-03 – 2014-04-07 (×9): 2 via ORAL
  Filled 2014-04-03: qty 2
  Filled 2014-04-03: qty 1
  Filled 2014-04-03 (×3): qty 2
  Filled 2014-04-03: qty 1
  Filled 2014-04-03 (×5): qty 2

## 2014-04-03 NOTE — Progress Notes (Signed)
Pt c/o numbness and pain in right foot, pt moves foot and toes to command, states he feels me touching toes and foot. Faint palp DP and PT pulses confirmed with positive dopplers on both. Dr Shirlee Latch notified and orders received.

## 2014-04-03 NOTE — Progress Notes (Addendum)
Patient ID: Raymond [name], male   DOB: 06/17/1960, 54 y.o.   MRN: [id number]   54 yo with history of nonischemic cardiomyopathy, EF 10-15% by last echo and St Jude CRT-D device, chronic atrial fibrillation s/p AV nodal ablation, h/o VT, CKD, CVA presented with VT/VF and syncope.  At last office visit, torsemide was increased and amiodarone was decreased to 200 daily.  He was hypokalemic at admission.    Since admission, he was shocked for VT on the evening of 3/12 and again on the evening of 3/13.  He got metolazone on 3/13 and was hypokalemic afterwards.  CVP remains about 12.  He is not hypokalemic today.  One run of about 6 beats NSVT overnight.   Scheduled Meds: . acyclovir  400 mg Oral Daily  . amiodarone  400 mg Oral BID  . antiseptic oral rinse  7 mL Mouth Rinse BID  . Chlorhexidine Gluconate Cloth  6 each Topical Q0600  . cyclobenzaprine  10 mg Oral BID  . levothyroxine  250 mcg Oral QAC breakfast  . magnesium oxide  400 mg Oral Daily  . mupirocin ointment  1 application Nasal BID  . potassium chloride  60 mEq Oral BID  . sodium chloride  10-40 mL Intracatheter Q12H  . sodium chloride  3 mL Intravenous Q12H  . spironolactone  25 mg Oral BID  . torsemide  80 mg Oral BID  . Warfarin - Pharmacist Dosing Inpatient   Does not apply q1800   Continuous Infusions: . sodium chloride Stopped (04/01/14 0430)  . milrinone 0.375 mcg/kg/min (04/03/14 0604)   PRN Meds:.sodium chloride, acetaminophen, ALPRAZolam, guaiFENesin-codeine, guaiFENesin-dextromethorphan, nitroGLYCERIN, ondansetron (ZOFRAN) IV, sodium chloride, sodium chloride   Filed Vitals:   04/03/14 0502 04/03/14 0600 04/03/14 0700 04/03/14 0723  BP: 123/88   110/84  Pulse:   70 473  Temp: 97.8 F (36.6 C)   98.2 F (36.8 C)  TempSrc: Oral   Oral  Resp:  32 24 28  Height:      Weight: 270 lb 1 oz (122.5 kg)     SpO2:  98% 97% 98%    Intake/Output Summary (Last 24 hours) at 04/03/14 1021 Last data filed at 04/03/14  0700  Gross per 24 hour  Intake  545.5 ml  Output    950 ml  Net -404.5 ml    LABS: Basic Metabolic Panel:  Recent Labs  40/98/11 0710 04/03/14 0528  NA 133* 131*  K 3.4* 4.1  CL 86* 89*  CO2 38* 36*  GLUCOSE 148* 157*  BUN 20 25*  CREATININE 1.87* 1.61*  CALCIUM 8.9 9.0  MG 2.4 2.4   Liver Function Tests: No results for input(s): AST, ALT, ALKPHOS, BILITOT, PROT, ALBUMIN in the last 72 hours. No results for input(s): LIPASE, AMYLASE in the last 72 hours. CBC:  Recent Labs  04/02/14 0710  WBC 9.8  HGB 14.0  HCT 41.9  MCV 91.9  PLT 269   Cardiac Enzymes: No results for input(s): CKTOTAL, CKMB, CKMBINDEX, TROPONINI in the last 72 hours. BNP: Invalid input(s): POCBNP D-Dimer: No results for input(s): DDIMER in the last 72 hours. Hemoglobin A1C: No results for input(s): HGBA1C in the last 72 hours. Fasting Lipid Panel: No results for input(s): CHOL, HDL, LDLCALC, TRIG, CHOLHDL, LDLDIRECT in the last 72 hours. Thyroid Function Tests: No results for input(s): TSH, T4TOTAL, T3FREE, THYROIDAB in the last 72 hours.  Invalid input(s): FREET3 Anemia Panel: No results for input(s): VITAMINB12, FOLATE, FERRITIN, TIBC, IRON, RETICCTPCT  in the last 72 hours.  RADIOLOGY: Dg Chest 2 View  03/28/2014   CLINICAL DATA:  Shortness of breath since last night.  EXAM: CHEST  2 VIEW  COMPARISON:  02/18/2014  FINDINGS: Pacer/AICD device.  A right internal jugular line terminates over the internal jugular vein. Midline trachea. Moderate cardiomegaly. No pleural effusion or pneumothorax. Mild interstitial edema. No lobar consolidation.  IMPRESSION: Mild congestive heart failure.  Right internal jugular line with tip projecting over the low right neck. Presuming low SVC position is desired, this should be advanced approximately 11 cm.   Electronically Signed   By: Jeronimo Greaves M.D.   On: 03/28/2014 20:47   Dg Chest Port 1 View  04/02/2014   CLINICAL DATA:  Central line placement  EXAM:  PORTABLE CHEST - 1 VIEW  COMPARISON:  03/30/2014  FINDINGS: Marked cardiac enlargement. Pulmonary vascular congestion and mild interstitial edema is unchanged. No significant effusion.  Right arm PICC tip enters the SVC with the tip not well visualized due to pacemaker leads.  Right jugular catheter tip overlies the neck at the cervical thoracic junction possibly and external jugular branch. This is unchanged.  IMPRESSION: Congestive heart failure with mild edema  Right arm PICC tip enters the SVC with the tip not seen due to overlying pacemaker leads  Right external jugular catheter tip remains in the lower neck unchanged from the prior study.   Electronically Signed   By: Marlan Palau M.D.   On: 04/02/2014 11:50   Dg Chest Port 1 View  03/30/2014   CLINICAL DATA:  54 year old male with a history of syncope. Report of AICD discharge  EXAM: PORTABLE CHEST - 1 VIEW  COMPARISON:  03/28/2014, fluoroscopy 02/21/2014  FINDINGS: Cardiomediastinal silhouette is similar to prior with cardiomegaly.  Central vascular congestion with right hilar opacities.  No pneumothorax.  No large pleural effusion. Retrocardiac region not well evaluated given the EKG leads, defibrillator pads, and overlying soft tissues of the chest wall.  Unchanged position of AICD generator on the left chest wall with 3 leads in place.  Right IJ tunneled central catheter has been withdrawn to the cervical internal jugular vein since the placement 02/21/2014.  IMPRESSION: Evidence of pulmonary vascular congestion/developing edema.  Cardiomegaly.  Interval withdrawal of the right IJ central venous catheter into the cervical internal jugular vein.  Overlying defibrillator pads.  Signed,  Yvone Neu. Loreta Ave, DO  [facility]  [facility]   Electronically Signed   By: Gilmer Mor D.O.   On: 03/30/2014 11:33   Dg Foot Complete Right  04/03/2014   CLINICAL DATA:  Syncope and fall, injuring right foot 3 days  ago  EXAM: RIGHT FOOT COMPLETE - 3+ VIEW  COMPARISON:  None.  FINDINGS: There is no evidence of fracture or dislocation. There is no evidence of arthropathy or other focal bone abnormality. Soft tissues are unremarkable.  IMPRESSION: Negative.   Electronically Signed   By: Ellery Plunk M.D.   On: 04/03/2014 00:51    PHYSICAL EXAM General: NAD Neck: JVP 10 cm, no thyromegaly or thyroid nodule.  Lungs: Clear to auscultation bilaterally with normal respiratory effort. CV: Nondisplaced PMI.  Heart irregular S1/S2 (frequent PVCs), no S3/S4, no murmur.  No peripheral edema.  No carotid bruit.  Normal pedal pulses.  Abdomen: Soft, nontender, no hepatosplenomegaly, no distention.  Neurologic: Alert and oriented x 3.  Psych: Normal affect. Extremities: No clubbing or cyanosis.   TELEMETRY: Reviewed telemetry pt in suspect underlying atrial  fibrillation with v-pacing, frequent PVCs  ASSESSMENT AND PLAN: 54 yo with history of nonischemic cardiomyopathy, EF 10-15% by last echo and St Jude CRT-D device, chronic atrial fibrillation s/p AV nodal ablation, h/o VT, CKD, CVA presented with VT/VF and syncope.  1. VT: Multiple episodes recently.  At last office visit, amiodarone was decreased and torsemide was increased.  He has been hypokalemic.  Only short run NSVT last night.   - Home on amiodarone 200 mg bid long-term, KCl 60 bid, and magnesium oxide 400 mg daily. - Spironolactone increased to 25 mg bid.  2. Acute on chronic systolic CHF: EF 21-30% on 1/16 echo, nonischemic cardiomyopathy.  St Jude CRT-D device.  Not good candidate for advanced therapies, has no caregiver at home. CVP 12, remains somewhat volume overloaded.   - I increased torsemide to 80 mg bid chronically, hopefully this will allow [place] to use metolazone less.   - Continue home milrinone at 0.375. New PICC, needs to have tunneled line removed.  - Co-ox 57%, not great but will not increase milrinone.  3. Atrial fibrillation: Chronic, s/p  AVN ablation.  Continue warfarin.  4. CKD: Creatinine stable at his baseline.  5. Foot pain: No fracture on imaging, no erythema or swelling on exam.  Can have Percocet x 5 pills at discharge and will need to followup with PCP.  6. Disposition: I will let him go home today. Needs followup next week in CHF clinic 1 week with BMET in 1 week.  Meds for home: Milrinone 0.375, torsemide 80 mg bid, KCl 60 mEq bid, spironolactone 25 mg bid, magnesium oxide 400 mg daily, amiodarone 200 mg bid, noncardiac meds as prior to admission.   Marca Ancona 04/03/2014 10:21 AM  I discussed discharge with patient again, he is feeling short of breath now and not comfortable going home.  CVP is elevated, so I will give him a dose of metolazone now along with extra potassium.  Reassess for d/c tomorrow.   Marca Ancona 04/03/2014 11:19 AM

## 2014-04-03 NOTE — Progress Notes (Signed)
ANTICOAGULATION CONSULT NOTE - Follow-Up Consult  Pharmacy Consult for warfarin Indication: atrial fibrillation and stroke  Allergies  Allergen Reactions  . Valsartan Cough    Patient Measurements: Height: 5\' 9"  (175.3 cm) Weight: 270 lb 1 oz (122.5 kg) IBW/kg (Calculated) : 70.7  Vital Signs: Temp: 97.6 F (36.4 C) (03/16 1121) Temp Source: Oral (03/16 1121) BP: 125/90 mmHg (03/16 1121) Pulse Rate: 73 (03/16 0723)  Labs:  Recent Labs  04/01/14 0323 04/01/14 1420 04/02/14 0710 04/03/14 0528  HGB  --   --  14.0  --   HCT  --   --  41.9  --   PLT  --   --  269  --   LABPROT 27.1*  --  36.3* 35.6*  INR 2.48*  --  3.61* 3.52*  CREATININE 1.83* 1.78* 1.87* 1.61*    Estimated Creatinine Clearance: 70.2 mL/min (by C-G formula based on Cr of 1.61).   Medical History: Past Medical History  Diagnosis Date  . Non-ischemic cardiomyopathy     a. echo 11/10: EF 15%;   b. cath 10/08: normal cors;  c. Myoview 5/12: Very mild distal ant and apical isch, EF 17% (low risk-medical Rx cont'd);  d.  Echo 4/14:  EF 15%;  e. 05/2009 s/p SJM BiV ICD;  f. 07/2012 TEE: EF 20-25%, diff HK mild MR, mod TR.   Marland Kitchen Atrial fibrillation     a. on coumadin;  b. 07/2012 s/p TEE/DCCV. c. Recurrence 08/2012 in setting of abnormal thyroid panel.; 12/2012 AVN ablation by Dr [name]  . Hypertension   . Obesity   . Non-compliance   . Dyslexia     unable to read.  Marland Kitchen History of CVA (cerebrovascular accident) 11/2008    a. 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 RBBB (right bundle branch block)   . Cough syncope     a. During 08/2012 adm.  . Systolic CHF, chronic 01/19/1996  . Hypothyroidism 01/19/1996    s/p RAI therapy  . Stroke 01/19/2008  . Renal insufficiency    Assessment: 54 yo M admitted 03/30/2014 for VT shock - in setting low K.  Pharmacy consulted to dose warfarin.  PMH: Afib, NICM, EF 15% ICD with Hx VT  AC: warf for chronic AFib - CVR - INR 1.65 on admit > 2.48 restart home dose  > now super therapeutic, CBC stable no bleeding PTA warf 7.5mg  MWF, 11.25mg  TTSS  Goal of Therapy:  INR 2-3 Monitor platelets by anticoagulation protocol: Yes   Plan:  No Coumadin tonight  INR daily   Thank you for allowing pharmacy to be a part of this patients care team.  Lovenia Kim Pharm.D., BCPS, AQ-Cardiology Clinical Pharmacist 04/03/2014 12:49 PM Pager: [phone] Phone: [phone]

## 2014-04-03 NOTE — Telephone Encounter (Signed)
Will forward to providers rounding on patient in hospital at this time to place consult order to hospice if appropriate after speaking with patient about his wants/needs.

## 2014-04-03 NOTE — Telephone Encounter (Signed)
Pt mother called, pt is in the hospital she would like to talk to [facility] about pt going to Hospice.. Please advise

## 2014-04-03 NOTE — Progress Notes (Signed)
Upon transfering pt to 2H18, pt called "friend, Lollie Sails" to ask for pain pills to be brought to the hospital because "the hospital is not giving him pain medication for his foot". Pt made this phone call in front of myself.  I advised the patient that he is not allowed to take any medication from home while in the hospital. I noitifed the RN, Cristal Deer B who was accepting the transfer of the conversation.

## 2014-04-04 ENCOUNTER — Inpatient Hospital Stay (HOSPITAL_COMMUNITY): Payer: Medicare Other

## 2014-04-04 ENCOUNTER — Encounter: Payer: Self-pay | Admitting: Internal Medicine

## 2014-04-04 DIAGNOSIS — N183 Chronic kidney disease, stage 3 (moderate): Secondary | ICD-10-CM

## 2014-04-04 DIAGNOSIS — Z0389 Encounter for observation for other suspected diseases and conditions ruled out: Secondary | ICD-10-CM

## 2014-04-04 HISTORY — PX: IR GENERIC HISTORICAL: IMG1180011

## 2014-04-04 LAB — BASIC METABOLIC PANEL
Anion gap: 10 (ref 5–15)
Anion gap: 11 (ref 5–15)
BUN: 28 mg/dL — AB (ref 6–23)
BUN: 32 mg/dL — ABNORMAL HIGH (ref 6–23)
CALCIUM: 8.4 mg/dL (ref 8.4–10.5)
CO2: 28 mmol/L (ref 19–32)
CO2: 33 mmol/L — ABNORMAL HIGH (ref 19–32)
Calcium: 9.1 mg/dL (ref 8.4–10.5)
Chloride: 92 mmol/L — ABNORMAL LOW (ref 96–112)
Chloride: 82 mmol/L — ABNORMAL LOW (ref 96–112)
Creatinine, Ser: 1.76 mg/dL — ABNORMAL HIGH (ref 0.50–1.35)
Creatinine, Ser: 1.8 mg/dL — ABNORMAL HIGH (ref 0.50–1.35)
GFR calc non Af Amer: 42 mL/min — ABNORMAL LOW (ref >90)
GFR, EST AFRICAN AMERICAN: 50 mL/min — AB (ref >90)
GFR, EST AFRICAN AMERICAN: 49 mL/min — AB (ref >90)
GFR, EST NON AFRICAN AMERICAN: 43 mL/min — AB (ref >90)
Glucose, Bld: 130 mg/dL — ABNORMAL HIGH (ref 70–99)
Glucose, Bld: 257 mg/dL — ABNORMAL HIGH (ref 70–99)
POTASSIUM: 5.1 mmol/L (ref 3.5–5.1)
Potassium: 3.2 mmol/L — ABNORMAL LOW (ref 3.5–5.1)
SODIUM: 130 mmol/L — AB (ref 135–145)
Sodium: 126 mmol/L — ABNORMAL LOW (ref 135–145)

## 2014-04-04 LAB — CBC
HEMATOCRIT: 39 % (ref 39.0–52.0)
Hemoglobin: 12.8 g/dL — ABNORMAL LOW (ref 13.0–17.0)
MCH: 30.3 pg (ref 26.0–34.0)
MCHC: 32.8 g/dL (ref 30.0–36.0)
MCV: 92.4 fL (ref 78.0–100.0)
PLATELETS: 274 10*3/uL (ref 150–400)
RBC: 4.22 MIL/uL (ref 4.22–5.81)
RDW: 15.5 % (ref 11.5–15.5)
WBC: 11.3 10*3/uL — ABNORMAL HIGH (ref 4.0–10.5)

## 2014-04-04 LAB — PROTIME-INR
INR: 3.26 — ABNORMAL HIGH (ref 0.00–1.49)
Prothrombin Time: 33.5 seconds — ABNORMAL HIGH (ref 11.6–15.2)

## 2014-04-04 LAB — MAGNESIUM: MAGNESIUM: 2.3 mg/dL (ref 1.5–2.5)

## 2014-04-04 MED ORDER — FUROSEMIDE 10 MG/ML IJ SOLN
10.0000 mg/h | INTRAVENOUS | Status: DC
  Administered 2014-04-04 – 2014-04-05 (×2): 10 mg/h via INTRAVENOUS
  Filled 2014-04-04 (×6): qty 25

## 2014-04-04 MED ORDER — RANOLAZINE ER 500 MG PO TB12
1000.0000 mg | ORAL_TABLET | Freq: Two times a day (BID) | ORAL | Status: DC
  Administered 2014-04-04 – 2014-04-09 (×11): 1000 mg via ORAL
  Filled 2014-04-04 (×12): qty 2

## 2014-04-04 MED ORDER — RANOLAZINE ER 500 MG PO TB12
500.0000 mg | ORAL_TABLET | Freq: Two times a day (BID) | ORAL | Status: DC
Start: 2014-04-04 — End: 2014-04-04

## 2014-04-04 MED ORDER — AMIODARONE HCL IN DEXTROSE 360-4.14 MG/200ML-% IV SOLN
30.0000 mg/h | INTRAVENOUS | Status: DC
  Administered 2014-04-05 – 2014-04-07 (×4): 30 mg/h via INTRAVENOUS
  Filled 2014-04-04 (×10): qty 200

## 2014-04-04 MED ORDER — AMIODARONE LOAD VIA INFUSION
150.0000 mg | Freq: Once | INTRAVENOUS | Status: AC
  Administered 2014-04-04: 150 mg via INTRAVENOUS
  Filled 2014-04-04: qty 83.34

## 2014-04-04 MED ORDER — VANCOMYCIN HCL IN DEXTROSE 750-5 MG/150ML-% IV SOLN
750.0000 mg | Freq: Two times a day (BID) | INTRAVENOUS | Status: DC
  Administered 2014-04-04 – 2014-04-05 (×4): 750 mg via INTRAVENOUS
  Filled 2014-04-04 (×5): qty 150

## 2014-04-04 MED ORDER — AMIODARONE HCL IN DEXTROSE 360-4.14 MG/200ML-% IV SOLN
30.0000 mg/h | INTRAVENOUS | Status: AC
  Administered 2014-04-04 – 2014-04-05 (×4): 60 mg/h via INTRAVENOUS
  Filled 2014-04-04 (×7): qty 200

## 2014-04-04 MED ORDER — AMIODARONE IV BOLUS ONLY 150 MG/100ML: 150.0000 mg | Freq: Once | INTRAVENOUS | Status: DC

## 2014-04-04 MED ORDER — FUROSEMIDE 10 MG/ML IJ SOLN
40.0000 mg | Freq: Once | INTRAMUSCULAR | Status: AC
  Administered 2014-04-04: 40 mg via INTRAVENOUS
  Filled 2014-04-04: qty 4

## 2014-04-04 MED ORDER — OXYCODONE HCL 5 MG PO TABS
5.0000 mg | ORAL_TABLET | Freq: Four times a day (QID) | ORAL | Status: AC | PRN
Start: 2014-04-04 — End: 2014-04-05
  Administered 2014-04-05 (×2): 5 mg via ORAL
  Filled 2014-04-04 (×2): qty 1

## 2014-04-04 MED ORDER — LIDOCAINE HCL 1 % IJ SOLN
INTRAMUSCULAR | Status: AC
Start: 2014-04-04 — End: 2014-04-04
  Filled 2014-04-04: qty 20

## 2014-04-04 NOTE — Progress Notes (Signed)
Right IJ line removed.  No complications petrolatum gauze dressing applied.

## 2014-04-04 NOTE — Progress Notes (Addendum)
Patient ID: Raymond [name], male   DOB: November 02, 1960, 54 y.o.   MRN: [id number]   54 yo with history of nonischemic cardiomyopathy, EF 10-15% by last echo and St Jude CRT-D device, chronic atrial fibrillation s/p AV nodal ablation, h/o VT, CKD, CVA presented with VT/VF and syncope.  At last office visit, torsemide was increased and amiodarone was decreased to 200 daily.  He was hypokalemic at admission.    Since admission, he was shocked for VT on the evening of 3/12 and again on the evening of 3/13.  He got metolazone on 3/17. Yesterday spironolactone was increased. Weight down another 2 pounds but UOP not vigorous.  Last night he had about a 20-30 beat run NSVT and multiple shorter runs.   Complaining of R foot pain. Denies SOB.  Scheduled Meds: . acyclovir  400 mg Oral Daily  . amiodarone  400 mg Oral BID  . cyclobenzaprine  10 mg Oral BID  . levothyroxine  250 mcg Oral QAC breakfast  . magnesium oxide  400 mg Oral Daily  . mupirocin ointment  1 application Nasal BID  . potassium chloride  60 mEq Oral BID  . sodium chloride  10-40 mL Intracatheter Q12H  . sodium chloride  3 mL Intravenous Q12H  . spironolactone  25 mg Oral BID  . torsemide  80 mg Oral BID  . Warfarin - Pharmacist Dosing Inpatient   Does not apply q1800   Continuous Infusions: . sodium chloride Stopped (04/01/14 0430)  . milrinone 0.375 mcg/kg/min (04/04/14 0057)   PRN Meds:.sodium chloride, acetaminophen, ALPRAZolam, guaiFENesin-codeine, guaiFENesin-dextromethorphan, nitroGLYCERIN, ondansetron (ZOFRAN) IV, oxyCODONE-acetaminophen, sodium chloride, sodium chloride   Filed Vitals:   04/03/14 2008 04/03/14 2300 04/04/14 0100 04/04/14 0300  BP: 134/72 98/78 137/61 138/89  Pulse: 72   70  Temp: 97.7 F (36.5 C) 98 F (36.7 C)  98.3 F (36.8 C)  TempSrc: Oral Oral  Oral  Resp: 16 17 17 30   Height:      Weight:    268 lb 4.8 oz (121.7 kg)  SpO2: 95% 93%  95%    Intake/Output Summary (Last 24 hours) at 04/04/14  0707 Last data filed at 04/04/14 0600  Gross per 24 hour  Intake 1373.5 ml  Output    700 ml  Net  673.5 ml    LABS: Basic Metabolic Panel:  Recent Labs  71/24/58 0528 04/04/14 0340  NA 131* 130*  K 4.1 5.1  CL 89* 92*  CO2 36* 28  GLUCOSE 157* 130*  BUN 25* 28*  CREATININE 1.61* 1.76*  CALCIUM 9.0 9.1  MG 2.4 2.3   Liver Function Tests: No results for input(s): AST, ALT, ALKPHOS, BILITOT, PROT, ALBUMIN in the last 72 hours. No results for input(s): LIPASE, AMYLASE in the last 72 hours. CBC:  Recent Labs  04/02/14 0710 04/04/14 0340  WBC 9.8 11.3*  HGB 14.0 12.8*  HCT 41.9 39.0  MCV 91.9 92.4  PLT 269 274   Cardiac Enzymes: No results for input(s): CKTOTAL, CKMB, CKMBINDEX, TROPONINI in the last 72 hours. BNP: Invalid input(s): POCBNP D-Dimer: No results for input(s): DDIMER in the last 72 hours. Hemoglobin A1C: No results for input(s): HGBA1C in the last 72 hours. Fasting Lipid Panel: No results for input(s): CHOL, HDL, LDLCALC, TRIG, CHOLHDL, LDLDIRECT in the last 72 hours. Thyroid Function Tests: No results for input(s): TSH, T4TOTAL, T3FREE, THYROIDAB in the last 72 hours.  Invalid input(s): FREET3 Anemia Panel: No results for input(s): VITAMINB12, FOLATE, FERRITIN, TIBC,  IRON, RETICCTPCT in the last 72 hours.  RADIOLOGY: Dg Chest 2 View  03/28/2014   CLINICAL DATA:  Shortness of breath since last night.  EXAM: CHEST  2 VIEW  COMPARISON:  02/18/2014  FINDINGS: Pacer/AICD device.  A right internal jugular line terminates over the internal jugular vein. Midline trachea. Moderate cardiomegaly. No pleural effusion or pneumothorax. Mild interstitial edema. No lobar consolidation.  IMPRESSION: Mild congestive heart failure.  Right internal jugular line with tip projecting over the low right neck. Presuming low SVC position is desired, this should be advanced approximately 11 cm.   Electronically Signed   By: Jeronimo Greaves M.D.   On: 03/28/2014 20:47   Dg  Chest Port 1 View  04/02/2014   CLINICAL DATA:  Central line placement  EXAM: PORTABLE CHEST - 1 VIEW  COMPARISON:  03/30/2014  FINDINGS: Marked cardiac enlargement. Pulmonary vascular congestion and mild interstitial edema is unchanged. No significant effusion.  Right arm PICC tip enters the SVC with the tip not well visualized due to pacemaker leads.  Right jugular catheter tip overlies the neck at the cervical thoracic junction possibly and external jugular branch. This is unchanged.  IMPRESSION: Congestive heart failure with mild edema  Right arm PICC tip enters the SVC with the tip not seen due to overlying pacemaker leads  Right external jugular catheter tip remains in the lower neck unchanged from the prior study.   Electronically Signed   By: Marlan Palau M.D.   On: 04/02/2014 11:50   Dg Chest Port 1 View  03/30/2014   CLINICAL DATA:  53 year old male with a history of syncope. Report of AICD discharge  EXAM: PORTABLE CHEST - 1 VIEW  COMPARISON:  03/28/2014, fluoroscopy 02/21/2014  FINDINGS: Cardiomediastinal silhouette is similar to prior with cardiomegaly.  Central vascular congestion with right hilar opacities.  No pneumothorax.  No large pleural effusion. Retrocardiac region not well evaluated given the EKG leads, defibrillator pads, and overlying soft tissues of the chest wall.  Unchanged position of AICD generator on the left chest wall with 3 leads in place.  Right IJ tunneled central catheter has been withdrawn to the cervical internal jugular vein since the placement 02/21/2014.  IMPRESSION: Evidence of pulmonary vascular congestion/developing edema.  Cardiomegaly.  Interval withdrawal of the right IJ central venous catheter into the cervical internal jugular vein.  Overlying defibrillator pads.  Signed,  Yvone Neu. Loreta Ave, DO  [facility]  [facility]   Electronically Signed   By: Gilmer Mor D.O.   On: 03/30/2014 11:33   Dg Foot Complete  Right  04/03/2014   CLINICAL DATA:  Syncope and fall, injuring right foot 3 days ago  EXAM: RIGHT FOOT COMPLETE - 3+ VIEW  COMPARISON:  None.  FINDINGS: There is no evidence of fracture or dislocation. There is no evidence of arthropathy or other focal bone abnormality. Soft tissues are unremarkable.  IMPRESSION: Negative.   Electronically Signed   By: Ellery Plunk M.D.   On: 04/03/2014 00:51    PHYSICAL EXAM CVP 12  General: NAD Neck: JVP 12-14 cm, no thyromegaly or thyroid nodule.  Lungs: Clear to auscultation bilaterally with normal respiratory effort. CV: Nondisplaced PMI.  Heart irregular S1/S2 (frequent PVCs), no S3/S4, no murmur.  No peripheral edema.  No carotid bruit.  Normal pedal pulses.  Abdomen: Soft, nontender, no hepatosplenomegaly, no distention.  Neurologic: Alert and oriented x 3.  Psych: Normal affect. Extremities: No clubbing or cyanosis.  R foot red  TELEMETRY: Reviewed telemetry pt in suspect underlying atrial fibrillation with v-pacing, frequent PVCs  ASSESSMENT AND PLAN: 54 yo with history of nonischemic cardiomyopathy, EF 10-15% by last echo and St Jude CRT-D device, chronic atrial fibrillation s/p AV nodal ablation, h/o VT, CKD, CVA presented with VT/VF and syncope.  1. VT: Multiple episodes recently.  At last office visit, amiodarone was decreased and torsemide was increased.  He has been hypokalemic but now repleted.  More NSVT last night.   - Now on amiodarone 400 mg bid to be transitioned to 200 mg bid long-term, KCl 60 bid, and magnesium oxide 400 mg daily. - Continue spironolactone  to 25 mg bid. K up to 5.1 today but got extra K yesterday.  - Will ask EP to see today, ?add additional med for VT => ?mexiletine. Would be concerned about using ranolazine given propensity toward hypokalemia/hypomagnesemia with risk of long QT.   2. Acute on chronic systolic CHF: EF 16-10% on 1/16 echo, nonischemic cardiomyopathy.  St Jude CRT-D device.  Not good candidate  for advanced therapies, has no caregiver at home.  He still looks volume overloaded with increased CVP.  - Lasix 40 mg IV today with Lasix gtt at 10 mg/hr for diuresis.  - Continue home milrinone at 0.375. New PICC, needs to have tunneled line removed in IR.  - Co-ox 56%, not great but will not increase milrinone.  3. Atrial fibrillation: Chronic, s/p AVN ablation.  Continue warfarin.  4. CKD: Creatinine stable at his baseline.  5. Foot pain: No fracture on imaging.  Now with probable cellulitis.    CLEGG,AMY 04/04/2014 7:07 AM   Patient seen with PA, agree with the above note.  Transition to IV Lasix as above.  Will talk to EP about addition of mexiletine (would avoid ranolazine given propensity to low K and low Mg with long QT).  PT consult given poor mobility.    Marca Ancona 04/04/2014 7:54 AM

## 2014-04-04 NOTE — Consult Note (Signed)
Electrophysiology Consult Note  Patient ID: Raymond [name] MRN: [id number], DOB/AGE: 52-Sep-1962   Admit date: 03/30/2014  Reason For Consult: Recurrent Ventricular Tachycardia Referring MD: Dr. Shirlee Latch  Primary Physician: Nilda Simmer, MD Primary Cardiologist: Dr. Gala Romney Electrophysiologist: Dr. [name]  Pt. Profile:  54 y/o male with a history of nonischemic cardiomyopathy, EF 20-25% by last echo 02/15/14 s/p St Jude CRT-D device, chronic atrial fibrillation s/p AV nodal ablation, h/o VT, CKD and CVA admitted for VT/VF and syncope.   Problem List  Past Medical History  Diagnosis Date  . Non-ischemic cardiomyopathy     a. echo 11/10: EF 15%;   b. cath 10/08: normal cors;  c. Myoview 5/12: Very mild distal ant and apical isch, EF 17% (low risk-medical Rx cont'd);  d.  Echo 4/14:  EF 15%;  e. 05/2009 s/p SJM BiV ICD;  f. 07/2012 TEE: EF 20-25%, diff HK mild MR, mod TR.   Marland Kitchen Atrial fibrillation     a. on coumadin;  b. 07/2012 s/p TEE/DCCV. c. Recurrence 08/2012 in setting of abnormal thyroid panel.; 12/2012 AVN ablation by Dr [name]  . Hypertension   . Obesity   . Non-compliance   . Dyslexia     unable to read.  Marland Kitchen History of CVA (cerebrovascular accident) 11/2008    a. 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 RBBB (right bundle branch block)   . Cough syncope     a. During 08/2012 adm.  . Systolic CHF, chronic 01/19/1996  . Hypothyroidism 01/19/1996    s/p RAI therapy  . Stroke 01/19/2008  . Renal insufficiency     Past Surgical History  Procedure Laterality Date  . Bi-ventricular implantable cardioverter defibrillator  (crt-d)  06/11/09    [employer] [id number] BIVENTRICULAR AICD SERIAL #[id number]  . Knee arthroscopy Left ~ 1995  . Cardiac catheterization      "several time" (09/01/2012)  . Tee without cardioversion N/A 06/23/2012    Procedure: TRANSESOPHAGEAL ECHOCARDIOGRAM (TEE);  Surgeon: Vesta Mixer, MD;  Location: [facility] ENDOSCOPY;  Service:  Cardiovascular;  Laterality: N/A;  TEE will be done at 0730   . Cardioversion N/A 07/27/2012    Procedure: CARDIOVERSION;  Surgeon: Vesta Mixer, MD;  Location: [facility] ENDOSCOPY;  Service: Cardiovascular;  Laterality: N/A;  . Tee without cardioversion N/A 08/10/2012    Procedure: TRANSESOPHAGEAL ECHOCARDIOGRAM (TEE);  Surgeon: Wendall Stade, MD;  Location: [facility] ENDOSCOPY;  Service: Cardiovascular;  Laterality: N/A;  . Cardioversion N/A 08/10/2012    Procedure: CARDIOVERSION;  Surgeon: Wendall Stade, MD;  Location: [facility] ENDOSCOPY;  Service: Cardiovascular;  Laterality: N/A;  . Sp pta add intra cran  11/2008    a. 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 Ablation  01/09/2013    AVN ablation by Dr [name]  . Av node ablation N/A 01/09/2013    Procedure: AV NODE ABLATION;  Surgeon: Marinus Maw, MD;  Location: [facility] CATH LAB;  Service: Cardiovascular;  Laterality: N/A;  . Right heart catheterization N/A 02/15/2014    Procedure: RIGHT HEART CATH;  Surgeon: Dolores Patty, MD;  Location: [facility] CATH LAB;  Service: Cardiovascular;  Laterality: N/A;     Allergies  Allergies  Allergen Reactions  . Valsartan Cough    HPI  The patient is a 54 y/o male with a history of nonischemic cardiomyopathy, EF 20-25% by last echo 02/15/14, s/p St Jude CRT-D device, chronic atrial fibrillation s/p AV nodal ablation, h/o  VT, CKD and CVA. He is on chronic oral anticoagulation therapy with warfarin. He was admitted 03/30/14 for VT/VF and syncope.   Prior to this admission, he was admitted to [facility] 1/29-02/22/14 with cardiogenic shock. He was treated with pressors and ultimately discharged on home milrinone. At time of post hospital f/u, his amiodarone was reduced to 200 mg daily and his Torsemide was increased to 40 mg BID and spironolactone was added.   On 03/30/14 he had syncope and collapse at his home. He was brought to [facility] ED and enrout had two runs of VT terminated with ICD therapy. He was  hypokalemic on admission. Since admission, he has had recurrent VT and required additional shocks on the evening of 3/12 and again 3/13. Last night, he had a 20-30 beat run of NSVT and multiple shorter runs. His potassium has been repleated. Both K and Mg are WNL. K today is 5.1 and Mg is 2.3. His PO amiodarone has been increased back to 400 mg BID.   Electrophysiology has been consulted for VT management recommendations, particularly regarding the use of mexiletine in this patient.    Home Medications  Prior to Admission medications   Medication Sig Start Date End Date Taking? Authorizing Provider  acyclovir (ZOVIRAX) 800 MG tablet Take 400 mg by mouth daily.    Yes Historical Provider, MD  ALPRAZolam (XANAX) 0.25 MG tablet Take 1 tablet (0.25 mg total) by mouth at bedtime as needed for anxiety. 04/16/13  Yes Ethelda Chick, MD  amiodarone (PACERONE) 200 MG tablet Take 1 tablet (200 mg total) by mouth daily. 02/27/14  Yes Amy D Clegg, NP  cyclobenzaprine (FLEXERIL) 10 MG tablet Take 1 tablet (10 mg total) by mouth 2 (two) times daily. 07/26/13  Yes Pearline Cables, MD  DM-Doxylamine-Acetaminophen (NYQUIL COLD & FLU PO) Take 30 mLs by mouth daily as needed (for cold).   Yes Historical Provider, MD  levothyroxine (SYNTHROID, LEVOTHROID) 125 MCG tablet Take 2 tablets (250 mcg total) by mouth daily before breakfast. Additional refill should come from PCP 03/27/14  Yes Amy D Clegg, NP  Menthol (RICOLA) LOZG Use as directed 1 lozenge in the mouth or throat daily as needed (for cough).   Yes Historical Provider, MD  milrinone (PRIMACOR) 20 MG/100ML SOLN infusion Inject 48.45 mcg/min into the vein continuous. 02/22/14  Yes Amy D Clegg, NP  potassium chloride (K-DUR) 10 MEQ tablet Take 4 tablets (40 mEq total) by mouth daily. 03/28/14  Yes Silas Flood, MD  spironolactone (ALDACTONE) 25 MG tablet Take 1 tablet (25 mg total) by mouth daily. 08/29/13  Yes Aundria Rud, NP  torsemide (DEMADEX) 20 MG tablet Take 3  tablets (60 mg total) by mouth 2 (two) times daily. 03/28/14  Yes Silas Flood, MD  warfarin (COUMADIN) 7.5 MG tablet Take 1 tablet daily Patient taking differently: Take 7.5-11.25 mg by mouth daily at 6 PM. Takes 7.5mg  on Mon, Wed and Fri Takes 11.25mg  all other days 02/22/14  Yes Amy D Clegg, NP  ZAROXOLYN 2.5 MG tablet Take 1 tablet (2.5 mg total) by mouth daily. 03/28/14  Yes Silas Flood, MD  HYDROcodone-acetaminophen (NORCO/VICODIN) 5-325 MG per tablet TAKE 1 TABLET BY MOUTH EVERY 8 HOURS AS NEEDED Patient not taking: Reported on 03/30/2014 07/30/13   Pearline Cables, MD    Family History  Family History  Problem Relation Age of Onset  . Heart disease Father 104    AMI  . Diabetes Father   . Stroke Father 38  CVA x 2  . Diabetes Sister   . Atrial fibrillation Sister   . Hyperlipidemia Mother   . Hypertension Mother   . Heart disease Mother 51    CHF    Social History  History   Social History  . Marital Status: Legally Separated    Spouse Name: N/A  . Number of Children: 0  . Years of Education: N/A   Occupational History  . DISABLED    Social History Main Topics  . Smoking status: Never Smoker   . Smokeless tobacco: Never Used  . Alcohol Use: 0.0 oz/week    0 Standard drinks or equivalent per week     Comment: Rare  . Drug Use: No  . Sexual Activity: Not Currently   Other Topics Concern  . Not on file   Social History Narrative   Marital status: divorced since 2014; married x 16 years; dating x 4 years since CVA.      Children: none      Lives: Lives alone in house in [place].        Employment:  Disabled from CVA in 2011 (L sided weakness).      Tobacco: none      Alcohol: rarely      Drugs: none      Exercise:  Sporadically.      ADLs: drives; independent with ADLs; girlfriend does grocery shopping; ambulates with cane.      Seatbelt: 100%         Review of Systems General:  No chills, fever, night sweats or weight changes.  Cardiovascular:  No  chest pain, dyspnea on exertion, edema, orthopnea, palpitations, paroxysmal nocturnal dyspnea. Dermatological: No rash, lesions/masses Respiratory: No cough, dyspnea Urologic: No hematuria, dysuria Abdominal:   No nausea, vomiting, diarrhea, bright red blood per rectum, melena, or hematemesis Neurologic:  No visual changes, wkns, changes in mental status. All other systems reviewed and are otherwise negative except as noted above.  Physical Exam  Blood pressure 147/85, pulse 70, temperature 97.6 F (36.4 C), temperature source Oral, resp. rate 19, height  (1.753 m), weight 268 lb 4.8 oz (121.7 kg), SpO2 95 %.  General: Pleasant, NAD Psych: Normal affect. Neuro: Alert and oriented X 3. Moves all extremities spontaneously. HEENT: Normal  Neck: Supple without bruits or JVD. Lungs:  Resp regular and unlabored, CTA. Heart: RRR no s3, s4, or murmurs. Abdomen: Soft, non-tender, non-distended, BS + x 4.  Extremities: No clubbing, cyanosis or edema. DP/PT/Radials 2+ and equal bilaterally.  Labs  Troponin (Point of Care Test) No results for input(s): TROPIPOC in the last 72 hours. No results for input(s): CKTOTAL, CKMB, TROPONINI in the last 72 hours. Lab Results  Component Value Date   WBC 11.3* 04/04/2014   HGB 12.8* 04/04/2014   HCT 39.0 04/04/2014   MCV 92.4 04/04/2014   PLT 274 04/04/2014    Recent Labs Lab 03/30/14 1010  04/04/14 0340  NA 136  < > 130*  K 3.5  < > 5.1  CL 92*  < > 92*  CO2 30  < > 28  BUN 16  < > 28*  CREATININE 1.79*  < > 1.76*  CALCIUM 9.4  < > 9.1  PROT 7.2  --   --   BILITOT 1.7*  --   --   ALKPHOS 109  --   --   ALT 11  --   --   AST 28  --   --   GLUCOSE 164*  < >  130*  < > = values in this interval not displayed. Lab Results  Component Value Date   CHOL  11/19/2008    135        ATP III CLASSIFICATION:  <200     mg/dL   Desirable  161-096  mg/dL   Borderline High  >=045    mg/dL   High          HDL 32* 11/19/2008   LDLCALC   11/19/2008    79        Total Cholesterol/HDL:CHD Risk Coronary Heart Disease Risk Table                     Men   Women  1/2 Average Risk   3.4   3.3  Average Risk       5.0   4.4  2 X Average Risk   9.6   7.1  3 X Average Risk  23.4   11.0        Use the calculated Patient Ratio above and the CHD Risk Table to determine the patient's CHD Risk.        ATP III CLASSIFICATION (LDL):  <100     mg/dL   Optimal  409-811  mg/dL   Near or Above                    Optimal  130-159  mg/dL   Borderline  914-782  mg/dL   High  >956     mg/dL   Very High   TRIG 213 11/19/2008   No results found for: DDIMER   Radiology/Studies  Dg Chest 2 View  03/28/2014   CLINICAL DATA:  Shortness of breath since last night.  EXAM: CHEST  2 VIEW  COMPARISON:  02/18/2014  FINDINGS: Pacer/AICD device.  A right internal jugular line terminates over the internal jugular vein. Midline trachea. Moderate cardiomegaly. No pleural effusion or pneumothorax. Mild interstitial edema. No lobar consolidation.  IMPRESSION: Mild congestive heart failure.  Right internal jugular line with tip projecting over the low right neck. Presuming low SVC position is desired, this should be advanced approximately 11 cm.   Electronically Signed   By: Jeronimo Greaves M.D.   On: 03/28/2014 20:47   Dg Chest Port 1 View  04/02/2014   CLINICAL DATA:  Central line placement  EXAM: PORTABLE CHEST - 1 VIEW  COMPARISON:  03/30/2014  FINDINGS: Marked cardiac enlargement. Pulmonary vascular congestion and mild interstitial edema is unchanged. No significant effusion.  Right arm PICC tip enters the SVC with the tip not well visualized due to pacemaker leads.  Right jugular catheter tip overlies the neck at the cervical thoracic junction possibly and external jugular branch. This is unchanged.  IMPRESSION: Congestive heart failure with mild edema  Right arm PICC tip enters the SVC with the tip not seen due to overlying pacemaker leads  Right external  jugular catheter tip remains in the lower neck unchanged from the prior study.   Electronically Signed   By: Marlan Palau M.D.   On: 04/02/2014 11:50   Dg Chest Port 1 View  03/30/2014   CLINICAL DATA:  54 year old male with a history of syncope. Report of AICD discharge  EXAM: PORTABLE CHEST - 1 VIEW  COMPARISON:  03/28/2014, fluoroscopy 02/21/2014  FINDINGS: Cardiomediastinal silhouette is similar to prior with cardiomegaly.  Central vascular congestion with right hilar opacities.  No pneumothorax.  No large pleural effusion. Retrocardiac region not well  evaluated given the EKG leads, defibrillator pads, and overlying soft tissues of the chest wall.  Unchanged position of AICD generator on the left chest wall with 3 leads in place.  Right IJ tunneled central catheter has been withdrawn to the cervical internal jugular vein since the placement 02/21/2014.  IMPRESSION: Evidence of pulmonary vascular congestion/developing edema.  Cardiomegaly.  Interval withdrawal of the right IJ central venous catheter into the cervical internal jugular vein.  Overlying defibrillator pads.  Signed,  Yvone Neu. Loreta Ave, DO  [facility]  [facility]   Electronically Signed   By: Gilmer Mor D.O.   On: 03/30/2014 11:33   Dg Foot Complete Right  04/03/2014   CLINICAL DATA:  Syncope and fall, injuring right foot 3 days ago  EXAM: RIGHT FOOT COMPLETE - 3+ VIEW  COMPARISON:  None.  FINDINGS: There is no evidence of fracture or dislocation. There is no evidence of arthropathy or other focal bone abnormality. Soft tissues are unremarkable.  IMPRESSION: Negative.   Electronically Signed   By: Ellery Plunk M.D.   On: 04/03/2014 00:51    Echocardiogram 02/15/14  Study Conclusions  - Left ventricle: The cavity size was severely dilated. Systolic function was severely reduced. The estimated ejection fraction was in the range of 10-15%. Diffuse hypokinesis.  Doppler parameters are consistent with a restrictive pattern, indicative of decreased left ventricular diastolic compliance and/or increased left atrial pressure (grade 3 diastolic dysfunction). Doppler parameters are consistent with elevated ventricular end-diastolic filling pressure. - Aortic valve: Trileaflet; normal thickness leaflets. There was no regurgitation. - Mitral valve: There was mild functional regurgitation with central jet. - Left atrium: The atrium was severely dilated. - Right ventricle: The cavity size was moderately dilated. Wall thickness was normal. Pacer wire or catheter noted in right ventricle. Systolic function was mildly reduced. - Right atrium: The atrium was moderately dilated. Pacer wire or catheter noted in right atrium. - Tricuspid valve: There was mild regurgitation. - Pulmonic valve: There was mild regurgitation. - Pulmonary arteries: Systolic pressure was severely increased. PA peak pressure: 64 mm Hg (S). - Inferior vena cava: The vessel was normal in size. - Pericardium, extracardiac: There was no pericardial effusion.  Impressions:  - No significant change when compared to the prior study from 05/02/2013.     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 Recurrent VT: Patient continues to have recurrent VT and runs of NSVT despite normalization of K and Mg. He is on the maximum  dose of PO Amiodarone at 400 mg BID. He has sustained 3 ICD shocks since 3/12. 2/3 occurred during this admission. ? If use of mexiletine is a suitable option for this patient. Dr. Graciela Husbands to see and will determine.      Signed, Robbie Lis, PA-C 04/04/2014, 8:43 AM  Presented with VT-PM in sttting of hypokalemia   He continues to have VT NS but now monomorphic as well as freq  polymorphic ectopy  Will try ranolazine (I Na-L ) channel blocker and see if in conjunction with amio cant suppress ecomplex ectopy.  I wonder to what degree the milrinone is proarrhythmic  If ranolazine doesn't work, would use mexilitene, trying for now to avoid proarrhythmic potential

## 2014-04-04 NOTE — Progress Notes (Signed)
ANTICOAGULATION CONSULT NOTE - Follow-Up Consult/ NEW Vancomycin consult  Pharmacy Consult for warfarin/ Vancomycin Indication: atrial fibrillation and stroke/ cellulitis  Allergies  Allergen Reactions  . Valsartan Cough    Patient Measurements: Height: 5\' 9"  (175.3 cm) Weight: 268 lb 4.8 oz (121.7 kg) IBW/kg (Calculated) : 70.7  Vital Signs: Temp: 98.3 F (36.8 C) (03/17 0300) Temp Source: Oral (03/17 0300) BP: 138/89 mmHg (03/17 0300) Pulse Rate: 70 (03/17 0300)  Labs:  Recent Labs  04/02/14 0710 04/03/14 0528 04/04/14 0340  HGB 14.0  --  12.8*  HCT 41.9  --  39.0  PLT 269  --  274  LABPROT 36.3* 35.6* 33.5*  INR 3.61* 3.52* 3.26*  CREATININE 1.87* 1.61* 1.76*    Estimated Creatinine Clearance: 64 mL/min (by C-G formula based on Cr of 1.76).   Medical History: Past Medical History  Diagnosis Date  . Non-ischemic cardiomyopathy     a. echo 11/10: EF 15%;   b. cath 10/08: normal cors;  c. Myoview 5/12: Very mild distal ant and apical isch, EF 17% (low risk-medical Rx cont'd);  d.  Echo 4/14:  EF 15%;  e. 05/2009 s/p SJM BiV ICD;  f. 07/2012 TEE: EF 20-25%, diff HK mild MR, mod TR.   Marland Kitchen Atrial fibrillation     a. on coumadin;  b. 07/2012 s/p TEE/DCCV. c. Recurrence 08/2012 in setting of abnormal thyroid panel.; 12/2012 AVN ablation by Dr [name]  . Hypertension   . Obesity   . Non-compliance   . Dyslexia     unable to read.  Marland Kitchen History of CVA (cerebrovascular accident) 11/2008    a. 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 RBBB (right bundle branch block)   . Cough syncope     a. During 08/2012 adm.  . Systolic CHF, chronic 01/19/1996  . Hypothyroidism 01/19/1996    s/p RAI therapy  . Stroke 01/19/2008  . Renal insufficiency    Assessment: 54 yo M admitted 03/30/2014 for VT shock - in setting low K.  Pharmacy consulted to dose warfarin.  PMH: Afib, NICM, EF 15% ICD with Hx VT  AC: warf for chronic AFib - CVR - INR 1.65 on admit > 2.48 restart home  dose >now remains super therapeutic, CBC stable no bleeding PTA warf 7.5mg  MWF, 11.25mg  TTSS  ID: foot Cellulitis, afeb, but WBC rising 3/17 vanc>>  Goal of Therapy:  INR 2-3 Monitor platelets by anticoagulation protocol: Yes   Plan:  No Coumadin tonight  INR daily Vancomycin 750 mg IV q12h Follow up SCr, UOP, cultures, clinical course and adjust as clinically indicated.   Thank you for allowing pharmacy to be a part of this patients care team.  Lovenia Kim Pharm.D., BCPS, AQ-Cardiology Clinical Pharmacist 04/04/2014 7:40 AM Pager: [phone] Phone: [phone]

## 2014-04-04 NOTE — Progress Notes (Signed)
[facility]  Patient Status: Active pt with [facility] prior to this admission  [facility] is providing the following services: [facility] HHRN and Home Infusion for home Milrinone.  [facility] hospital team will follow and work with pt while in the hospital to support transition home when ordered by HF Team.   If patient discharges after hours, please call [phone].   Sedalia Muta 04/04/2014, 3:35 PM

## 2014-04-05 LAB — PROTIME-INR
INR: 2.92 — AB (ref 0.00–1.49)
Prothrombin Time: 30.7 seconds — ABNORMAL HIGH (ref 11.6–15.2)

## 2014-04-05 LAB — BASIC METABOLIC PANEL
ANION GAP: 14 (ref 5–15)
Anion gap: 12 (ref 5–15)
Anion gap: 11 (ref 5–15)
BUN: 32 mg/dL — AB (ref 6–23)
BUN: 33 mg/dL — ABNORMAL HIGH (ref 6–23)
BUN: 31 mg/dL — ABNORMAL HIGH (ref 6–23)
CALCIUM: 8.9 mg/dL (ref 8.4–10.5)
CALCIUM: 9.2 mg/dL (ref 8.4–10.5)
CALCIUM: 8.9 mg/dL (ref 8.4–10.5)
CHLORIDE: 83 mmol/L — AB (ref 96–112)
CO2: 34 mmol/L — AB (ref 19–32)
CO2: 35 mmol/L — ABNORMAL HIGH (ref 19–32)
CO2: 32 mmol/L (ref 19–32)
CREATININE: 1.85 mg/dL — AB (ref 0.50–1.35)
CREATININE: 1.86 mg/dL — AB (ref 0.50–1.35)
Chloride: 86 mmol/L — ABNORMAL LOW (ref 96–112)
Chloride: 85 mmol/L — ABNORMAL LOW (ref 96–112)
Creatinine, Ser: 1.8 mg/dL — ABNORMAL HIGH (ref 0.50–1.35)
GFR calc Af Amer: 47 mL/min — ABNORMAL LOW (ref >90)
GFR calc Af Amer: 47 mL/min — ABNORMAL LOW (ref >90)
GFR calc non Af Amer: 41 mL/min — ABNORMAL LOW (ref >90)
GFR calc non Af Amer: 42 mL/min — ABNORMAL LOW (ref >90)
GFR, EST AFRICAN AMERICAN: 49 mL/min — AB (ref >90)
GFR, EST NON AFRICAN AMERICAN: 40 mL/min — AB (ref >90)
GLUCOSE: 280 mg/dL — AB (ref 70–99)
GLUCOSE: 200 mg/dL — AB (ref 70–99)
Glucose, Bld: 223 mg/dL — ABNORMAL HIGH (ref 70–99)
Potassium: 3.1 mmol/L — ABNORMAL LOW (ref 3.5–5.1)
Potassium: 3.4 mmol/L — ABNORMAL LOW (ref 3.5–5.1)
Potassium: 3.1 mmol/L — ABNORMAL LOW (ref 3.5–5.1)
SODIUM: 132 mmol/L — AB (ref 135–145)
Sodium: 129 mmol/L — ABNORMAL LOW (ref 135–145)
Sodium: 131 mmol/L — ABNORMAL LOW (ref 135–145)

## 2014-04-05 LAB — CARBOXYHEMOGLOBIN
CARBOXYHEMOGLOBIN: 1.5 % (ref 0.5–1.5)
Carboxyhemoglobin: 1.4 % (ref 0.5–1.5)
METHEMOGLOBIN: 0.8 % (ref 0.0–1.5)
METHEMOGLOBIN: 0.7 % (ref 0.0–1.5)
O2 Saturation: 47.7 %
O2 Saturation: 49.6 %
Total hemoglobin: 13 g/dL — ABNORMAL LOW (ref 13.5–18.0)
Total hemoglobin: 14.2 g/dL (ref 13.5–18.0)

## 2014-04-05 LAB — SODIUM: SODIUM: 131 mmol/L — AB (ref 135–145)

## 2014-04-05 LAB — CBC
HEMATOCRIT: 39.5 % (ref 39.0–52.0)
Hemoglobin: 12.9 g/dL — ABNORMAL LOW (ref 13.0–17.0)
MCH: 30.1 pg (ref 26.0–34.0)
MCHC: 32.7 g/dL (ref 30.0–36.0)
MCV: 92.3 fL (ref 78.0–100.0)
Platelets: 260 10*3/uL (ref 150–400)
RBC: 4.28 MIL/uL (ref 4.22–5.81)
RDW: 15.4 % (ref 11.5–15.5)
WBC: 9.9 10*3/uL (ref 4.0–10.5)

## 2014-04-05 LAB — MAGNESIUM: Magnesium: 1.8 mg/dL (ref 1.5–2.5)

## 2014-04-05 MED ORDER — WARFARIN SODIUM 7.5 MG PO TABS
7.5000 mg | ORAL_TABLET | Freq: Once | ORAL | Status: AC
  Administered 2014-04-05: 7.5 mg via ORAL
  Filled 2014-04-05: qty 1

## 2014-04-05 MED ORDER — MAGNESIUM SULFATE 50 % IJ SOLN
3.0000 g | Freq: Once | INTRAMUSCULAR | Status: AC
  Administered 2014-04-05: 3 g via INTRAVENOUS
  Filled 2014-04-05: qty 6

## 2014-04-05 MED ORDER — POTASSIUM CHLORIDE CRYS ER 20 MEQ PO TBCR
40.0000 meq | EXTENDED_RELEASE_TABLET | Freq: Once | ORAL | Status: AC
  Administered 2014-04-05: 40 meq via ORAL

## 2014-04-05 MED ORDER — TOLVAPTAN 15 MG PO TABS
15.0000 mg | ORAL_TABLET | ORAL | Status: AC
  Administered 2014-04-05 – 2014-04-07 (×3): 15 mg via ORAL
  Filled 2014-04-05 (×4): qty 1

## 2014-04-05 NOTE — Progress Notes (Signed)
In to assess patient, advised patient of fluid restriction of 1500 ml/day per orders from Dr Jearld Pies, he said we were crazy and that was not enough since we were taking fluid off of him.  Tried to talk to him about reason behind it and he said we were not smart enough to know what he needed.  Advised him that I would be following fluid restriction while he was under my care.

## 2014-04-05 NOTE — Progress Notes (Signed)
Inpatient Diabetes Program Recommendations  AACE/ADA: New Consensus Statement on Inpatient Glycemic Control (2013)  Target Ranges:  Prepandial:   less than 140 mg/dL      Peak postprandial:   less than 180 mg/dL (1-2 hours)      Critically ill patients:  140 - 180 mg/dL    Results for JESHAWN, FAHIM (MRN [id number]) as of 04/05/2014 09:06  Ref. Range 04/04/2014 03:40 04/04/2014 21:58 04/05/2014 04:30  Glucose Latest Range: 70-99 mg/dL 588 (H) 325 (H) 498 (H)   Diabetes history: None, Family history of DM father's side  Current orders for Inpatient glycemic control: None  Inpatient Diabetes Program Recommendations  Correction (SSI): Lab glucose elevated. Please consider CBG's and Novolog 0-9 units (sensitive scale) ACHS. HgbA1C: Lab glucose 280 mg/dl at 2641 this am. Please consider ordering an A1c to assess glucose levels from the past 2-3 months.   Thanks,  Christena Deem RN, MSN, [facility] Inpatient Diabetes Coordinator Team Pager [phone]

## 2014-04-05 NOTE — Progress Notes (Signed)
Patient ID: Raymond [name], male   DOB: [birth year], 54 y.o.   MRN: [id number]   54 yo with history of nonischemic cardiomyopathy, EF 10-15% by last echo and St Jude CRT-D device, chronic atrial fibrillation s/p AV nodal ablation, h/o VT, CKD, CVA presented with VT/VF and syncope.  At last office visit, torsemide was increased and amiodarone was decreased to 200 daily.  He was hypokalemic at admission.    Since admission, he was shocked for VT on the evening of 3/12 and again on the evening of 3/13.  Seen by Dr Graciela Husbands and started on ranolazine.  Amiodarone back to po. Yesterday, had 10 beat run NSVT in the am, otherwise only very short runs since then. He was started on Lasix gtt yesterday, weight down 3 lbs.  CVP 10 today.   Breathing better.  Still some pain in right foot.   Scheduled Meds: . acyclovir  400 mg Oral Daily  . cyclobenzaprine  10 mg Oral BID  . levothyroxine  250 mcg Oral QAC breakfast  . magnesium oxide  400 mg Oral Daily  . potassium chloride  40 mEq Oral Once  . potassium chloride  60 mEq Oral BID  . ranolazine  1,000 mg Oral BID  . sodium chloride  10-40 mL Intracatheter Q12H  . sodium chloride  3 mL Intravenous Q12H  . spironolactone  25 mg Oral BID  . vancomycin  750 mg Intravenous Q12H  . Warfarin - Pharmacist Dosing Inpatient   Does not apply q1800   Continuous Infusions: . sodium chloride Stopped (04/01/14 0430)  . amiodarone 60 mg/hr (04/05/14 0200)   Followed by  . amiodarone    . furosemide (LASIX) infusion 10 mg (04/04/14 2000)  . milrinone 0.375 mcg/kg/min (04/04/14 2100)   PRN Meds:.sodium chloride, acetaminophen, ALPRAZolam, guaiFENesin-codeine, guaiFENesin-dextromethorphan, nitroGLYCERIN, ondansetron (ZOFRAN) IV, oxyCODONE, oxyCODONE-acetaminophen, sodium chloride, sodium chloride   Filed Vitals:   04/04/14 2321 04/05/14 0100 04/05/14 0300 04/05/14 0530  BP: 133/70  145/96   Pulse:      Temp:   97.3 F (36.3 C)   TempSrc:   Oral   Resp: Height:      Weight:   265 lb (120.203 kg)   SpO2:   95%     Intake/Output Summary (Last 24 hours) at 04/05/14 0723 Last data filed at 04/05/14 0600  Gross per 24 hour  Intake 2975.87 ml  Output   3425 ml  Net -449.13 ml    LABS: Basic Metabolic Panel:  Recent Labs  40/98/11 0528 04/04/14 0340 04/04/14 2158 04/05/14 0430  NA 131* 130* 126* 129*  K 4.1 5.1 3.2* 3.1*  CL 89* 92* 82* 83*  CO2 36* 28 33* 34*  GLUCOSE 157* 130* 257* 280*  BUN 25* 28* 32* 32*  CREATININE 1.61* 1.76* 1.80* 1.85*  CALCIUM 9.0 9.1 8.4 8.9  MG 2.4 2.3  --   --    Liver Function Tests: No results for input(s): AST, ALT, ALKPHOS, BILITOT, PROT, ALBUMIN in the last 72 hours. No results for input(s): LIPASE, AMYLASE in the last 72 hours. CBC:  Recent Labs  04/04/14 0340 04/05/14 0430  WBC 11.3* 9.9  HGB 12.8* 12.9*  HCT 39.0 39.5  MCV 92.4 92.3  PLT 274 260   Cardiac Enzymes: No results for input(s): CKTOTAL, CKMB, CKMBINDEX, TROPONINI in the last 72 hours. BNP: Invalid input(s): POCBNP D-Dimer: No results for input(s): DDIMER in the last 72 hours. Hemoglobin A1C: No results  for input(s): HGBA1C in the last 72 hours. Fasting Lipid Panel: No results for input(s): CHOL, HDL, LDLCALC, TRIG, CHOLHDL, LDLDIRECT in the last 72 hours. Thyroid Function Tests: No results for input(s): TSH, T4TOTAL, T3FREE, THYROIDAB in the last 72 hours.  Invalid input(s): FREET3 Anemia Panel: No results for input(s): VITAMINB12, FOLATE, FERRITIN, TIBC, IRON, RETICCTPCT in the last 72 hours.  RADIOLOGY: Dg Chest 2 View  03/28/2014   CLINICAL DATA:  Shortness of breath since last night.  EXAM: CHEST  2 VIEW  COMPARISON:  02/18/2014  FINDINGS: Pacer/AICD device.  A right internal jugular line terminates over the internal jugular vein. Midline trachea. Moderate cardiomegaly. No pleural effusion or pneumothorax. Mild interstitial edema. No lobar consolidation.  IMPRESSION: Mild congestive heart failure.   Right internal jugular line with tip projecting over the low right neck. Presuming low SVC position is desired, this should be advanced approximately 11 cm.   Electronically Signed   By: Jeronimo Greaves M.D.   On: 03/28/2014 20:47   Dg Chest Port 1 View  04/02/2014   CLINICAL DATA:  Central line placement  EXAM: PORTABLE CHEST - 1 VIEW  COMPARISON:  03/30/2014  FINDINGS: Marked cardiac enlargement. Pulmonary vascular congestion and mild interstitial edema is unchanged. No significant effusion.  Right arm PICC tip enters the SVC with the tip not well visualized due to pacemaker leads.  Right jugular catheter tip overlies the neck at the cervical thoracic junction possibly and external jugular branch. This is unchanged.  IMPRESSION: Congestive heart failure with mild edema  Right arm PICC tip enters the SVC with the tip not seen due to overlying pacemaker leads  Right external jugular catheter tip remains in the lower neck unchanged from the prior study.   Electronically Signed   By: Marlan Palau M.D.   On: 04/02/2014 11:50   Dg Chest Port 1 View  03/30/2014   CLINICAL DATA:  54 year old male with a history of syncope. Report of AICD discharge  EXAM: PORTABLE CHEST - 1 VIEW  COMPARISON:  03/28/2014, fluoroscopy 02/21/2014  FINDINGS: Cardiomediastinal silhouette is similar to prior with cardiomegaly.  Central vascular congestion with right hilar opacities.  No pneumothorax.  No large pleural effusion. Retrocardiac region not well evaluated given the EKG leads, defibrillator pads, and overlying soft tissues of the chest wall.  Unchanged position of AICD generator on the left chest wall with 3 leads in place.  Right IJ tunneled central catheter has been withdrawn to the cervical internal jugular vein since the placement 02/21/2014.  IMPRESSION: Evidence of pulmonary vascular congestion/developing edema.  Cardiomegaly.  Interval withdrawal of the right IJ central venous catheter into the cervical internal jugular  vein.  Overlying defibrillator pads.  Signed,  Yvone Neu. Loreta Ave, DO  [facility]  [facility]   Electronically Signed   By: Gilmer Mor D.O.   On: 03/30/2014 11:33   Dg Foot Complete Right  04/03/2014   CLINICAL DATA:  Syncope and fall, injuring right foot 3 days ago  EXAM: RIGHT FOOT COMPLETE - 3+ VIEW  COMPARISON:  None.  FINDINGS: There is no evidence of fracture or dislocation. There is no evidence of arthropathy or other focal bone abnormality. Soft tissues are unremarkable.  IMPRESSION: Negative.   Electronically Signed   By: Ellery Plunk M.D.   On: 04/03/2014 00:51    PHYSICAL EXAM CVP 10  General: NAD Neck: JVP 10-12 cm, no thyromegaly or thyroid nodule.  Lungs: Clear to auscultation bilaterally with normal  respiratory effort. CV: Nondisplaced PMI.  Heart irregular S1/S2 (frequent PVCs), no S3/S4, no murmur.  No peripheral edema.  No carotid bruit.  Normal pedal pulses.  Abdomen: Soft, nontender, no hepatosplenomegaly, no distention.  Neurologic: Alert and oriented x 3.  Psych: Normal affect. Extremities: No clubbing or cyanosis.  R foot red   TELEMETRY: Reviewed telemetry pt in suspect underlying atrial fibrillation with v-pacing, frequent PVCs  ASSESSMENT AND PLAN: 54 yo with history of nonischemic cardiomyopathy, EF 10-15% by last echo and St Jude CRT-D device, chronic atrial fibrillation s/p AV nodal ablation, h/o VT, CKD, CVA presented with VT/VF and syncope.  1. VT: Multiple episodes recently.  At last office visit, amiodarone was decreased and torsemide was increased.  He has been hypokalemic but now repleted.  Short runs NSVT only since ranolazine started.   - He was restarted on amiodarone IV yesterday, decrease to 30 mg/hr today.  - Continue ranolazine 1000 bid.  - Continue spironolactone to 25 mg bid..  - Continue to replete K.   2. Acute on chronic systolic CHF: EF 16-10% on 1/16 echo, nonischemic cardiomyopathy.  St  Jude CRT-D device.  Not good candidate for advanced therapies, has no caregiver at home. Weight down 3 lbs on IV Lasix gtt yesterday.  Still volume overloaded on exam, CVP 10 by PICC. - Continue Lasix gtt today, back to po torsemide 80 bid tomorrow.    - Continue home milrinone at 0.375. Need to repeat co-ox, was low at 47% early this morning.  Not excited about increasing milrinone with ongoing NSVT.  3. Atrial fibrillation: Chronic, s/p AVN ablation.  Continue warfarin.  4. CKD: Creatinine stable at his baseline.  5. Foot pain: No fracture on imaging.  Now with probable cellulitis, on vancomycin. 6. Hyponatremia: With volume overload.  Fluid restrict 1500 cc and will give dose of tolvaptan 15 mg daily.    Marca Ancona 04/05/2014 7:23 AM

## 2014-04-05 NOTE — Evaluation (Signed)
Physical Therapy Evaluation Patient Details Name: Raymond [name] MRN: [id number] DOB: [birth year] Today's Date: 04/05/2014   History of Present Illness  Pt adm with syncope and vfib/vtach with ICD shock x 3. Pt also developed rt foot pain thought to be cellulitis. 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 Pain will need to be significantly reduced for pt to amb due to the complicating factor of very limited use of LUE which makes using a walker difficult. At this level do not feel pt can manage at home but pt not agreeable to SNF. Pt unable to answer how he can manage at home.    Follow Up Recommendations SNF (pt refusing this)    Equipment Recommendations  Other (comment) (to be assessed)    Recommendations for Other Services       Precautions / Restrictions Precautions Precautions: Fall Restrictions Weight Bearing Restrictions: No      Mobility  Bed Mobility Overal bed mobility: Needs Assistance Bed Mobility: Supine to Sit;Sit to Supine     Supine to sit: Mod assist;HOB elevated Sit to supine: Min assist   General bed mobility comments: Assist to elevate trunk into sitting. Assist to bring legs back up into bed.  Transfers                 General transfer comment: Pt unable to stand with 2 person assist due to rt foot pain. Only able to raise hips up from be 4-6".  Ambulation/Gait                Stairs            Wheelchair Mobility    Modified Rankin (Stroke Patients Only)       Balance                                             Pertinent Vitals/Pain Pain Assessment: 0-10 Pain Score: 9  Pain Location: rt foot Pain Descriptors / Indicators: Sharp Pain Intervention(s): Limited activity within patient's tolerance;Monitored during  session;Repositioned;Patient requesting pain meds-RN notified    Home Living Family/patient expects to be discharged to:: Private residence Living Arrangements: Spouse/significant other Available Help at Discharge: Family;Available PRN/intermittently Type of Home: House Home Access: Stairs to enter   Entrance Stairs-Number of Steps: 1 Home Layout: Two level;Able to live on main level with bedroom/bathroom Home Equipment: Cane - quad;Cane - single point;Grab bars - tub/shower      Prior Function Level of Independence: Independent with assistive device(s)         Comments: Amb with straight cane     Hand Dominance   Dominant Hand: Right    Extremity/Trunk Assessment   Upper Extremity Assessment: LUE deficits/detail       LUE Deficits / Details: very limited use due to significant weakness after stroke   Lower Extremity Assessment: LLE deficits/detail   LLE Deficits / Details: grossly 4/5 after stroke     Communication   Communication: No difficulties  Cognition Arousal/Alertness: Awake/alert Behavior During Therapy: WFL for tasks assessed/performed Overall Cognitive Status: Within Functional Limits for tasks assessed  General Comments      Exercises        Assessment/Plan    PT Assessment Patient needs continued PT services  PT Diagnosis Difficulty walking;Acute pain;Hemiplegia non-dominant side   PT Problem List Decreased strength;Decreased mobility;Pain;Obesity  PT Treatment Interventions DME instruction;Gait training;Functional mobility training;Therapeutic activities;Therapeutic exercise;Patient/family education   PT Goals (Current goals can be found in the Care Plan section) Acute Rehab PT Goals Patient Stated Goal: go home PT Goal Formulation: With patient Time For Goal Achievement: 04/12/14 Potential to Achieve Goals: Good    Frequency Min 3X/week   Barriers to discharge        Co-evaluation                End of Session   Activity Tolerance: Patient limited by pain Patient left: in bed;with call bell/phone within reach;with family/visitor present Nurse Communication: Mobility status         Time: 1610-9604 PT Time Calculation (min) (ACUTE ONLY): 15 min   Charges:   PT Evaluation $Initial PT Evaluation Tier I: 1 Procedure     PT G Codes:        Meridian Scherger 10-Apr-2014, 10:31 AM  Skip Mayer PT [phone]

## 2014-04-05 NOTE — Progress Notes (Signed)
patietn has been lyin gin bed all day, he has done well with fluid restriction, girlfriend brought one drink in from outside that he drink.  He is alert and oriented, reinforced with girlfriend about fluid restriction and she agrees with abiding by guidelines.  Will continue to monitor

## 2014-04-05 NOTE — Progress Notes (Signed)
ANTICOAGULATION CONSULT NOTE - Follow-Up Consult/ NEW Vancomycin consult  Pharmacy Consult for warfarin/ Vancomycin Indication: atrial fibrillation and stroke/ cellulitis  Allergies  Allergen Reactions  . Valsartan Cough    Patient Measurements: Height: 5\' 9"  (175.3 cm) Weight: 265 lb (120.203 kg) IBW/kg (Calculated) : 70.7  Vital Signs: Temp: 97.6 F (36.4 C) (03/18 1220) Temp Source: Oral (03/18 1220) BP: 123/106 mmHg (03/18 1220) Pulse Rate: 70 (03/18 1220)  Labs:  Recent Labs  04/03/14 0528 04/04/14 0340 04/04/14 2158 04/05/14 0430 04/05/14 1528  HGB  --  12.8*  --  12.9*  --   HCT  --  39.0  --  39.5  --   PLT  --  274  --  260  --   LABPROT 35.6* 33.5*  --  30.7*  --   INR 3.52* 3.26*  --  2.92*  --   CREATININE 1.61* 1.76* 1.80* 1.85* 1.86*    Estimated Creatinine Clearance: 60.1 mL/min (by C-G formula based on Cr of 1.86).   Medical History: Past Medical History  Diagnosis Date  . Non-ischemic cardiomyopathy     a. echo 11/10: EF 15%;   b. cath 10/08: normal cors;  c. Myoview 5/12: Very mild distal ant and apical isch, EF 17% (low risk-medical Rx cont'd);  d.  Echo 4/14:  EF 15%;  e. 05/2009 s/p SJM BiV ICD;  f. 07/2012 TEE: EF 20-25%, diff HK mild MR, mod TR.   Marland Kitchen Atrial fibrillation     a. on coumadin;  b. 07/2012 s/p TEE/DCCV. c. Recurrence 08/2012 in setting of abnormal thyroid panel.; 12/2012 AVN ablation by Dr [name]  . Hypertension   . Obesity   . Non-compliance   . Dyslexia     unable to read.  Marland Kitchen History of CVA (cerebrovascular accident) 11/2008    a. 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 RBBB (right bundle branch block)   . Cough syncope     a. During 08/2012 adm.  . Systolic CHF, chronic 01/19/1996  . Hypothyroidism 01/19/1996    s/p RAI therapy  . Stroke 01/19/2008  . Renal insufficiency    Assessment: 54 yo M admitted 03/30/2014 for VT shock - in setting low K.  Pharmacy consulted to dose warfarin.  PMH: Afib, NICM, EF 15%  ICD with Hx VT  AC: warf for chronic AFib - CVR - INR 1.65 on admit > 2.48 restart home dose > supratherapeutic > now at goal 2.9, CBC stable no bleeding PTA warf 7.5mg  MWF, 11.25mg  TTSS  ID: foot Cellulitis, afeb, but WBC rising 3/17 vanc>>  Goal of Therapy:  INR 2-3 Monitor platelets by anticoagulation protocol: Yes   Plan:   Coumadin 7.5mg  x1  tonight  INR daily Vancomycin 750 mg IV q12h Follow up SCr, UOP, cultures, clinical course and adjust as clinically indicated.   Leota Sauers Pharm.D. CPP, BCPS Clinical Pharmacist [phone] 04/05/2014 4:44 PM

## 2014-04-06 LAB — BASIC METABOLIC PANEL
ANION GAP: 11 (ref 5–15)
BUN: 33 mg/dL — AB (ref 6–23)
CHLORIDE: 87 mmol/L — AB (ref 96–112)
CO2: 33 mmol/L — AB (ref 19–32)
Calcium: 8.8 mg/dL (ref 8.4–10.5)
Creatinine, Ser: 1.81 mg/dL — ABNORMAL HIGH (ref 0.50–1.35)
GFR calc Af Amer: 48 mL/min — ABNORMAL LOW (ref >90)
GFR, EST NON AFRICAN AMERICAN: 42 mL/min — AB (ref >90)
Glucose, Bld: 206 mg/dL — ABNORMAL HIGH (ref 70–99)
Potassium: 3.5 mmol/L (ref 3.5–5.1)
SODIUM: 131 mmol/L — AB (ref 135–145)

## 2014-04-06 LAB — CBC
HEMATOCRIT: 39.2 % (ref 39.0–52.0)
Hemoglobin: 13 g/dL (ref 13.0–17.0)
MCH: 30.6 pg (ref 26.0–34.0)
MCHC: 33.2 g/dL (ref 30.0–36.0)
MCV: 92.2 fL (ref 78.0–100.0)
Platelets: 278 10*3/uL (ref 150–400)
RBC: 4.25 MIL/uL (ref 4.22–5.81)
RDW: 15.3 % (ref 11.5–15.5)
WBC: 7.9 10*3/uL (ref 4.0–10.5)

## 2014-04-06 LAB — CARBOXYHEMOGLOBIN
CARBOXYHEMOGLOBIN: 1.5 % (ref 0.5–1.5)
Methemoglobin: 1.1 % (ref 0.0–1.5)
O2 Saturation: 53.5 %
TOTAL HEMOGLOBIN: 13.4 g/dL — AB (ref 13.5–18.0)

## 2014-04-06 LAB — MAGNESIUM: MAGNESIUM: 2.6 mg/dL — AB (ref 1.5–2.5)

## 2014-04-06 LAB — PROTIME-INR
INR: 2.4 — AB (ref 0.00–1.49)
PROTHROMBIN TIME: 26.4 s — AB (ref 11.6–15.2)

## 2014-04-06 MED ORDER — TORSEMIDE 20 MG PO TABS
80.0000 mg | ORAL_TABLET | Freq: Every day | ORAL | Status: DC
  Administered 2014-04-06: 80 mg via ORAL
  Filled 2014-04-06 (×2): qty 4

## 2014-04-06 MED ORDER — WARFARIN SODIUM 10 MG PO TABS
10.0000 mg | ORAL_TABLET | Freq: Once | ORAL | Status: AC
  Administered 2014-04-06: 10 mg via ORAL
  Filled 2014-04-06: qty 1

## 2014-04-06 MED ORDER — SODIUM CHLORIDE 0.9 % IV SOLN
1250.0000 mg | INTRAVENOUS | Status: DC
  Administered 2014-04-06: 1250 mg via INTRAVENOUS
  Filled 2014-04-06 (×3): qty 1250

## 2014-04-06 MED ORDER — POTASSIUM CHLORIDE CRYS ER 20 MEQ PO TBCR
60.0000 meq | EXTENDED_RELEASE_TABLET | Freq: Once | ORAL | Status: AC
  Administered 2014-04-06: 60 meq via ORAL
  Filled 2014-04-06: qty 3

## 2014-04-06 NOTE — Clinical Social Work Psychosocial (Signed)
     Clinical Social Work Department BRIEF PSYCHOSOCIAL ASSESSMENT 04/06/2014  Patient:  Raymond [name], Raymond [name]     Account Number:  [id number]     Admit date:  03/30/2014  Clinical Social Worker:  Harless Nakayama  Date/Time:  04/06/2014 12:18 PM  Referred by:  Physician  Date Referred:  04/06/2014 Referred for  SNF Placement   Other Referral:   Interview type:  Patient Other interview type:   Pt girlfriend also at bedside    PSYCHOSOCIAL DATA Living Status:  ALONE Admitted from facility:   Level of care:   Primary support name:  Raymond [name] Primary support relationship to patient:  PARTNER Degree of support available:   Pt has limited support    CURRENT CONCERNS Current Concerns  Post-Acute Placement   Other Concerns:    SOCIAL WORK ASSESSMENT / PLAN CSW visited pt room to discuss SNF recommendation. CSW aware from PT note that pt may refuse. CSW introduced self and explained role. Pt laying in bed and pt girlfriend seated at bedside. Pt joking around during initial CSW entrance but flat affect and very brief throughout conversation. Pt informed CSW he does not want rehab and wants to go home. Pt confirmed he lives alone and does not have anyone he can stay with or that can come stay with him. CSW asked if pt has anyone that can check in on him more frequently. Pt pointed in direction of significant other and said CSW should ask her. Pt girlfriend stated she usually only sees pt on Saturday/Sunday and works from 8-5 during the week. Pt girlfriend stated it would be unlikely she would be able to change current routine. She stated she talks to pt through out the day though and can continue to do this. CSW acknowledged that this would be beneficial but did also advice that this is still not ideal support for dc home. Pt stated again that he would go home. CSW did make sure pt was clear on difference between SNF for [facility] rehab and home health services. Pt stated understanding. CSW  unsure if pt will agree to home health services but will update weekday RNCM. At this time, pt has no further hospital social work needs. CSW signing off.   Assessment/plan status:   Other assessment/ plan:   Information/referral to community resources:   SNF list denied    PATIENTS/FAMILYS RESPONSE TO PLAN OF CARE: Pt cooperative and agreeable to speaking with CSW. At this time, pt refusing any services from CSW and requesting for dc plan to be home.    [name], LCSWA  [phone]

## 2014-04-06 NOTE — Progress Notes (Signed)
MEDICATION RELATED CONSULT NOTE - FOLLOW UP   Pharmacy Consult for Coumadin / Vancomycin  Indication: Afib, stroke / Cellulitis  Allergies  Allergen Reactions  . Valsartan Cough    Patient Measurements: Height: 5\' 9"  (175.3 cm) Weight: 265 lb 6.9 oz (120.4 kg) IBW/kg (Calculated) : 70.7  Vital Signs: Temp: 98.3 F (36.8 C) (03/19 0735) Temp Source: Oral (03/19 0735) BP: 110/78 mmHg (03/19 0735) Pulse Rate: 73 (03/19 0407) Intake/Output from previous day: 03/18 0701 - 03/19 0700 In: 2881 [P.O.:1260; I.V.:1065; IV Piggyback:556] Out: 2715 [Urine:2715] Intake/Output from this shift:    Labs:  Recent Labs  04/04/14 0340  04/05/14 0430 04/05/14 0431 04/05/14 1528 04/05/14 2227 04/06/14 0417  WBC 11.3*  --  9.9  --   --   --  7.9  HGB 12.8*  --  12.9*  --   --   --  13.0  HCT 39.0  --  39.5  --   --   --  39.2  PLT 274  --  260  --   --   --  278  CREATININE 1.76*  < > 1.85*  --  1.86* 1.80* 1.81*  MG 2.3  --   --  1.8  --   --  2.6*  < > = values in this interval not displayed. Estimated Creatinine Clearance: 61.9 mL/min (by C-G formula based on Cr of 1.81).   Microbiology: Recent Results (from the past 720 hour(s))  MRSA PCR Screening     Status: Abnormal   Collection Time: 03/30/14  5:30 PM  Result Value Ref Range Status   MRSA by PCR POSITIVE (A) NEGATIVE Final    Comment:        The GeneXpert MRSA Assay (FDA approved for NASAL specimens only), is one component of a comprehensive MRSA colonization surveillance program. It is not intended to diagnose MRSA infection nor to guide or monitor treatment for MRSA infections. RESULT CALLED TO, READ BACK BY AND VERIFIED WITH: Edison Pace RN 2125 03/30/14 MITCHELL,L     Assessment: 54 yo M admitted 03/30/2014 for VT shock - in setting low K. Pharmacy consulted to dose warfarin. Warf for chronic AFib - CVR - INR 1.65 on admit > 2.48> subtherap> 12mg  x2 then supratherap> held 3 days. Restarted on 3/18 )7.5mg ) but  still trending down to a therapeutic 2.4 today. CBC stable no bleeding.  PTA warf 7.5mg  MWF, 11.25mg  TTSS  Day #3 of vancomycin for cellulitis. Afebrile and WBC wnl. Pt's normalized CrCl is ~69ml/min.   Goal of Therapy:  Goal VT is 10-76mcg/mL INR 2-3 Monitor platelets by anticoagulation protocol: Yes  Plan:  Change vancomycin to 1250mg  IV Q24 per protocol Give Coumadin 10mg  PO x 1 tonight  Monitor daily INR, CBC, s/s of bleed, renal function, VT at Css F/U abx LOT, deescalation   Donnabelle Blanchard J 04/06/2014,7:51 AM

## 2014-04-06 NOTE — Progress Notes (Signed)
EKG CRITICAL VALUE     12 lead EKG performed.  Critical value noted. Delanna Notice, RN notified.   Dacota Ruben H, CCT 04/06/2014 8:08 AM

## 2014-04-06 NOTE — Progress Notes (Signed)
Patient ID: Raymond [name], male   DOB: Nov 01, 1960, 54 y.o.   MRN: [id number]   54 yo with history of nonischemic cardiomyopathy, EF 10-15% by last echo and St Jude CRT-D device, chronic atrial fibrillation s/p AV nodal ablation, h/o VT, CKD, CVA presented with VT/VF and syncope.  At last office visit, torsemide was increased and amiodarone was decreased to 200 daily.  He was hypokalemic at admission.    Since admission, he was shocked for VT on the evening of 3/12 and again on the evening of 3/13.  Seen by Dr Graciela Husbands and started on ranolazine.  Amiodarone back to po. Remains on lasix gtt and milrinone at 0.375. Co-ox 54%. Creatinine stable at 1.8. Weight stable despite nearly 3L out. Intake 2.8L yesterday. CVP 9. Foot feels better. Breathing ok. Continues with frequent PVCs but no NSVT or VT.    Scheduled Meds: . acyclovir  400 mg Oral Daily  . cyclobenzaprine  10 mg Oral BID  . levothyroxine  250 mcg Oral QAC breakfast  . magnesium oxide  400 mg Oral Daily  . potassium chloride  60 mEq Oral BID  . ranolazine  1,000 mg Oral BID  . sodium chloride  10-40 mL Intracatheter Q12H  . sodium chloride  3 mL Intravenous Q12H  . spironolactone  25 mg Oral BID  . tolvaptan  15 mg Oral Q24H  . vancomycin  1,250 mg Intravenous Q24H  . warfarin  10 mg Oral ONCE-1800  . Warfarin - Pharmacist Dosing Inpatient   Does not apply q1800   Continuous Infusions: . sodium chloride Stopped (04/01/14 0430)  . amiodarone 30 mg/hr (04/06/14 0400)  . furosemide (LASIX) infusion 10 mg (04/06/14 0400)  . milrinone 0.375 mcg/kg/min (04/06/14 0937)   PRN Meds:.sodium chloride, acetaminophen, ALPRAZolam, guaiFENesin-codeine, guaiFENesin-dextromethorphan, nitroGLYCERIN, ondansetron (ZOFRAN) IV, oxyCODONE-acetaminophen, sodium chloride, sodium chloride   Filed Vitals:   04/06/14 0448 04/06/14 0454 04/06/14 0735 04/06/14 1141  BP:   110/78 115/63  Pulse:      Temp: 97.4 F (36.3 C)  98.3 F (36.8 C) 97.8 F (36.6  C)  TempSrc: Oral  Oral Oral  Resp:   17 18  Height:      Weight:  120.4 kg (265 lb 6.9 oz)    SpO2:   94% 93%    Intake/Output Summary (Last 24 hours) at 04/06/14 1216 Last data filed at 04/06/14 1145  Gross per 24 hour  Intake 2338.4 ml  Output   2800 ml  Net -461.6 ml    LABS: Basic Metabolic Panel:  Recent Labs  40/98/11 0431  04/05/14 2227 04/06/14 0417  NA  --   < > 131*  131* 131*  K  --   < > 3.1* 3.5  CL  --   < > 85* 87*  CO2  --   < > 32 33*  GLUCOSE  --   < > 223* 206*  BUN  --   < > 31* 33*  CREATININE  --   < > 1.80* 1.81*  CALCIUM  --   < > 8.9 8.8  MG 1.8  --   --  2.6*  < > = values in this interval not displayed. Liver Function Tests: No results for input(s): AST, ALT, ALKPHOS, BILITOT, PROT, ALBUMIN in the last 72 hours. No results for input(s): LIPASE, AMYLASE in the last 72 hours. CBC:  Recent Labs  04/05/14 0430 04/06/14 0417  WBC 9.9 7.9  HGB 12.9* 13.0  HCT 39.5 39.2  MCV 92.3 92.2  PLT 260 278   Cardiac Enzymes: No results for input(s): CKTOTAL, CKMB, CKMBINDEX, TROPONINI in the last 72 hours. BNP: Invalid input(s): POCBNP D-Dimer: No results for input(s): DDIMER in the last 72 hours. Hemoglobin A1C: No results for input(s): HGBA1C in the last 72 hours. Fasting Lipid Panel: No results for input(s): CHOL, HDL, LDLCALC, TRIG, CHOLHDL, LDLDIRECT in the last 72 hours. Thyroid Function Tests: No results for input(s): TSH, T4TOTAL, T3FREE, THYROIDAB in the last 72 hours.  Invalid input(s): FREET3 Anemia Panel: No results for input(s): VITAMINB12, FOLATE, FERRITIN, TIBC, IRON, RETICCTPCT in the last 72 hours.  RADIOLOGY: Dg Chest 2 View  03/28/2014   CLINICAL DATA:  Shortness of breath since last night.  EXAM: CHEST  2 VIEW  COMPARISON:  02/18/2014  FINDINGS: Pacer/AICD device.  A right internal jugular line terminates over the internal jugular vein. Midline trachea. Moderate cardiomegaly. No pleural effusion or pneumothorax.  Mild interstitial edema. No lobar consolidation.  IMPRESSION: Mild congestive heart failure.  Right internal jugular line with tip projecting over the low right neck. Presuming low SVC position is desired, this should be advanced approximately 11 cm.   Electronically Signed   By: Jeronimo Greaves M.D.   On: 03/28/2014 20:47   Dg Chest Port 1 View  04/02/2014   CLINICAL DATA:  Central line placement  EXAM: PORTABLE CHEST - 1 VIEW  COMPARISON:  03/30/2014  FINDINGS: Marked cardiac enlargement. Pulmonary vascular congestion and mild interstitial edema is unchanged. No significant effusion.  Right arm PICC tip enters the SVC with the tip not well visualized due to pacemaker leads.  Right jugular catheter tip overlies the neck at the cervical thoracic junction possibly and external jugular branch. This is unchanged.  IMPRESSION: Congestive heart failure with mild edema  Right arm PICC tip enters the SVC with the tip not seen due to overlying pacemaker leads  Right external jugular catheter tip remains in the lower neck unchanged from the prior study.   Electronically Signed   By: Marlan Palau M.D.   On: 04/02/2014 11:50   Dg Chest Port 1 View  03/30/2014   CLINICAL DATA:  54 year old male with a history of syncope. Report of AICD discharge  EXAM: PORTABLE CHEST - 1 VIEW  COMPARISON:  03/28/2014, fluoroscopy 02/21/2014  FINDINGS: Cardiomediastinal silhouette is similar to prior with cardiomegaly.  Central vascular congestion with right hilar opacities.  No pneumothorax.  No large pleural effusion. Retrocardiac region not well evaluated given the EKG leads, defibrillator pads, and overlying soft tissues of the chest wall.  Unchanged position of AICD generator on the left chest wall with 3 leads in place.  Right IJ tunneled central catheter has been withdrawn to the cervical internal jugular vein since the placement 02/21/2014.  IMPRESSION: Evidence of pulmonary vascular congestion/developing edema.  Cardiomegaly.   Interval withdrawal of the right IJ central venous catheter into the cervical internal jugular vein.  Overlying defibrillator pads.  Signed,  Yvone Neu. Loreta Ave, DO  [facility]  [facility]   Electronically Signed   By: Gilmer Mor D.O.   On: 03/30/2014 11:33   Dg Foot Complete Right  04/03/2014   CLINICAL DATA:  Syncope and fall, injuring right foot 3 days ago  EXAM: RIGHT FOOT COMPLETE - 3+ VIEW  COMPARISON:  None.  FINDINGS: There is no evidence of fracture or dislocation. There is no evidence of arthropathy or other focal bone abnormality. Soft tissues are unremarkable.  IMPRESSION: Negative.  Electronically Signed   By: Ellery Plunk M.D.   On: 04/03/2014 00:51    PHYSICAL EXAM CVP 9 General: NAD Neck: JVP 9cm, no thyromegaly or thyroid nodule.  Lungs: Clear to auscultation bilaterally with normal respiratory effort. CV: Nondisplaced PMI.  Heart irregular S1/S2 (frequent PVCs), no S3/S4, no murmur.  No peripheral edema.  No carotid bruit.  Normal pedal pulses.  Abdomen: Soft, nontender, no hepatosplenomegaly, no distention.  Neurologic: Alert and oriented x 3.  Psych: Normal affect. Extremities: No clubbing or cyanosis.  R foot erythematous but less tender to touch   TELEMETRY: Reviewed telemetry pt  underlying atrial fibrillation with v-pacing (s/p AVN ablation), frequent PVCs no NSVT/VT   ASSESSMENT AND PLAN: 54 yo with history of nonischemic cardiomyopathy, EF 10-15% by last echo and St Jude CRT-D device, chronic atrial fibrillation s/p AV nodal ablation, h/o VT, CKD, CVA presented with VT/VF and syncope.  1. VT: Multiple episodes recently.  At last office visit, amiodarone was decreased and torsemide was increased.  He has been hypokalemic but now repleted.  Short runs NSVT only since ranolazine started.   - Remains on amiodarone IV. Will switch back to po tomorrow - Continue ranolazine 1000 bid.  - Continue spironolactone to 25 mg  bid..  - Continue to replete K.   2. Acute on chronic systolic CHF: EF 78-24% on 1/16 echo, nonischemic cardiomyopathy.  St Jude CRT-D device.  Not good candidate for advanced therapies, has no caregiver at home. On home milrinone at 0.375 - CVP down to 9. Switch back to po torsemide 80 bid. Counseled on fluid restriction.   - Continue home milrinone at 0.375. Co-ox slightly improved but still marginal. Will not increase d/t ongoing NSVT/VT 3. Atrial fibrillation: Chronic, s/p AVN ablation.  Continue warfarin.  4. CKD: Creatinine stable at his baseline.  5. Foot pain: No fracture on imaging.  Now with probable cellulitis, on vancomycin. 6. Hyponatremia: With volume overload.  Stressed need to limit water and ice intake. He received one dose tolvaptan with minimal improvement.  Arvilla Meres MD 04/06/2014 12:16 PM

## 2014-04-06 NOTE — Progress Notes (Signed)
Physical Therapy Treatment Patient Details Name: Raymond [name] MRN: [id number] DOB: 02-09-1960 Today's Date: 04/06/2014    History of Present Illness Pt adm with syncope and vfib/vtach with ICD shock x 3. Pt also developed rt foot pain thought to be cellulitis. 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  Was able to stand x2 for 30 seconds each today.  Do not feel patient can return home alone safely at this time.  Continue to recommend SNF at discharge.  If patient refusing, will need HHPT.   Follow Up Recommendations  SNF (patient refusing)     Equipment Recommendations  Other (comment) (continue to assess)    Recommendations for Other Services       Precautions / Restrictions Precautions Precautions: Fall Restrictions Weight Bearing Restrictions: No    Mobility  Bed Mobility Overal bed mobility: Needs Assistance Bed Mobility: Supine to Sit;Sit to Supine     Supine to sit: Mod assist;HOB elevated Sit to supine: Min assist   General bed mobility comments: Assist to elevate trunk into sitting. Assist to bring legs back up into bed.  Transfers Overall transfer level: Needs assistance Equipment used: Straight cane Transfers: Sit to/from Stand Sit to Stand: Min assist;+2 physical assistance         General transfer comment: Patient stood x2 with +2 min assist.  Was able to stand for only 30 seconds each time due to increased pain in Rt foot.  Ambulation/Gait             General Gait Details: Unable   Information systems manager Rankin (Stroke Patients Only)       Balance                                    Cognition Arousal/Alertness: Awake/alert Behavior During Therapy: Agitated Overall Cognitive Status: Within Functional Limits for tasks assessed                       Exercises      General Comments        Pertinent Vitals/Pain Pain Assessment: 0-10 Pain Score: 9  Pain Location: Rt foot Pain Descriptors / Indicators: Aching;Sharp;Shooting Pain Intervention(s): Limited activity within patient's tolerance;Repositioned    Home Living                      Prior Function            PT Goals (current goals can now be found in the care plan section) Progress towards PT goals: Progressing toward goals (slow progress)    Frequency  Min 3X/week    PT Plan Current plan remains appropriate    Co-evaluation             End of Session Equipment Utilized During Treatment: Gait belt Activity Tolerance: Patient limited by pain Patient left: in bed;with call bell/phone within reach     Time: 1441-1457 PT Time Calculation (min) (ACUTE ONLY): 16 min  Charges:  $Therapeutic Activity: 8-22 mins                    G Codes:      Earlene Plater,  [name] 04/06/2014, 9:00 PM [name]. [name], [facility] Acute Rehab Services Pager [phone]

## 2014-04-07 ENCOUNTER — Other Ambulatory Visit: Payer: Self-pay

## 2014-04-07 DIAGNOSIS — L03115 Cellulitis of right lower limb: Secondary | ICD-10-CM

## 2014-04-07 LAB — BASIC METABOLIC PANEL
Anion gap: 10 (ref 5–15)
BUN: 29 mg/dL — ABNORMAL HIGH (ref 6–23)
CO2: 35 mmol/L — ABNORMAL HIGH (ref 19–32)
CREATININE: 1.78 mg/dL — AB (ref 0.50–1.35)
Calcium: 9 mg/dL (ref 8.4–10.5)
Chloride: 88 mmol/L — ABNORMAL LOW (ref 96–112)
GFR calc non Af Amer: 42 mL/min — ABNORMAL LOW (ref >90)
GFR, EST AFRICAN AMERICAN: 49 mL/min — AB (ref >90)
GLUCOSE: 133 mg/dL — AB (ref 70–99)
Potassium: 3.8 mmol/L (ref 3.5–5.1)
Sodium: 133 mmol/L — ABNORMAL LOW (ref 135–145)

## 2014-04-07 LAB — PROTIME-INR
INR: 2.86 — ABNORMAL HIGH (ref 0.00–1.49)
Prothrombin Time: 30.2 seconds — ABNORMAL HIGH (ref 11.6–15.2)

## 2014-04-07 MED ORDER — TORSEMIDE 20 MG PO TABS
80.0000 mg | ORAL_TABLET | Freq: Two times a day (BID) | ORAL | Status: DC
  Administered 2014-04-07 – 2014-04-08 (×4): 80 mg via ORAL
  Filled 2014-04-07 (×7): qty 4

## 2014-04-07 MED ORDER — DOXYCYCLINE HYCLATE 100 MG PO TABS
100.0000 mg | ORAL_TABLET | Freq: Two times a day (BID) | ORAL | Status: DC
  Administered 2014-04-07 – 2014-04-09 (×5): 100 mg via ORAL
  Filled 2014-04-07 (×6): qty 1

## 2014-04-07 MED ORDER — AMIODARONE HCL 200 MG PO TABS
400.0000 mg | ORAL_TABLET | Freq: Two times a day (BID) | ORAL | Status: DC
  Administered 2014-04-07 – 2014-04-08 (×4): 400 mg via ORAL
  Filled 2014-04-07 (×6): qty 2

## 2014-04-07 NOTE — Progress Notes (Addendum)
Physical Therapy Treatment Patient Details Name: Raymond [name] MRN: [id number] DOB: 11/28/1960 Today's Date: 04/07/2014    History of Present Illness Pt adm with syncope and vfib/vtach with ICD shock x 3. Pt also developed rt foot pain thought to be cellulitis. 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  Very unsteady on feet with near fall.  Spoke with patient again about need for SNF.  Patient states he is not going to "one of those places".  Patient unable to answer how he will care for himself at home.  If patient goes home, will need to function at w/c level and receive HHPT.   Follow Up Recommendations  SNF;Supervision/Assistance - 24 hour (Patient refusing SNF);   Home Health PT if returns home.     Equipment Recommendations  Wheelchair (measurements PT);Wheelchair cushion (measurements PT);Hospital bed (If returning home)    Recommendations for Other Services       Precautions / Restrictions Precautions Precautions: Fall Restrictions Weight Bearing Restrictions: No    Mobility  Bed Mobility Overal bed mobility: Needs Assistance Bed Mobility: Supine to Sit;Sit to Supine     Supine to sit: Min assist;HOB elevated Sit to supine: Min assist;HOB elevated   General bed mobility comments: Assist to raise trunk to sitting position.  To return to supine, assist to bring BLE's on to bed.  Required +2 max assist to scoot up to [facility].  Transfers Overall transfer level: Needs assistance Equipment used: Straight cane Transfers: Sit to/from [employer] Sit to Stand: Min assist;+2 physical assistance Stand pivot transfers: Mod assist;+2 physical assistance       General transfer comment: Patient able to move to standing from bed and recliner with +2 min assist to power up and for balance.  Min assist for balance in static standing.   Patient attempted ambulation - able to take only 2 steps.  On pivoting to chair, patient's Rt LE buckled, requiring +2 mod assist to get to chair and control descent.  Moved chair closer to bed.  Patient able to pivot to bed with min assist.  Ambulation/Gait Ambulation/Gait assistance: Min assist;+2 physical assistance Ambulation Distance (Feet): 2 Feet Assistive device: Straight cane Gait Pattern/deviations: Step-to pattern;Decreased step length - right;Decreased step length - left;Decreased stride length;Shuffle;Trunk flexed     General Gait Details: Patient able to take 2 steps with +2 assist.  Unable to progress due to pain and weakness.   Stairs            Wheelchair Mobility    Modified Rankin (Stroke Patients Only)       Balance Overall balance assessment: Needs assistance         Standing balance support: Single extremity supported Standing balance-Leahy Scale: Poor                      Cognition Arousal/Alertness: Awake/alert Behavior During Therapy: Agitated Overall Cognitive Status: Within Functional Limits for tasks assessed                      Exercises      General Comments        Pertinent Vitals/Pain Pain Assessment: 0-10 Pain Score: 8  Pain Location: Rt foot Pain Descriptors / Indicators: Aching;Sore Pain Intervention(s): Limited activity within patient's tolerance;Patient requesting pain meds-RN notified    Home Living  Prior Function            PT Goals (current goals can now be found in the care plan section) Progress towards PT goals: Not progressing toward goals - comment (Minimal change with mobility.  Pain limiting factor)    Frequency  Min 3X/week    PT Plan Current plan remains appropriate    Co-evaluation             End of Session Equipment Utilized During Treatment: Gait belt Activity Tolerance: Patient limited by pain;Patient limited by fatigue Patient left: in  bed;with call bell/phone within reach     Time: 1313-1340 PT Time Calculation (min) (ACUTE ONLY): 27 min  Charges:  $Therapeutic Activity: 23-37 mins                    G Codes:      [name] 2014-04-14, 1:56 PM [name]. [name], [facility] Acute Rehab Services Pager [phone]

## 2014-04-07 NOTE — Progress Notes (Signed)
Spoke with patient and significant other.  Patient is willing to go to rehab center but wishes to discuss with case manager before making final decision.

## 2014-04-07 NOTE — Progress Notes (Signed)
ANTICOAGULATION CONSULT NOTE - Follow Up Consult  Pharmacy Consult for Coumadin Indication: Afib / Stroke  Allergies  Allergen Reactions  . Valsartan Cough    Patient Measurements: Height: 5\' 9"  (175.3 cm) Weight: 268 lb 15.4 oz (122 kg) IBW/kg (Calculated) : 70.7  Vital Signs: Temp: 97.7 F (36.5 C) (03/20 0741) Temp Source: Oral (03/20 0741) BP: 118/74 mmHg (03/20 0741)  Labs:  Recent Labs  04/05/14 0430  04/05/14 2227 04/06/14 0417 04/07/14 0418  HGB 12.9*  --   --  13.0  --   HCT 39.5  --   --  39.2  --   PLT 260  --   --  278  --   LABPROT 30.7*  --   --  26.4* 30.2*  INR 2.92*  --   --  2.40* 2.86*  CREATININE 1.85*  < > 1.80* 1.81* 1.78*  < > = values in this interval not displayed.  Estimated Creatinine Clearance: 63.3 mL/min (by C-G formula based on Cr of 1.78).  Assessment: 54 yo M admitted 03/30/2014 for VT shock - in setting low K. Pharmacy consulted to dose warfarin. Warf for chronic AFib - CVR - INR 1.65 on admit > 2.48> subtherap> 12mg  x2 then supratherap> held 3 days. Restarted on 3/18, now therapeutic but up to 2.86 from 2.40 yesterday. Will plan to hold coumadin today with increase in INR. CBC stable no bleeding.  Goal of Therapy:  INR 2-3 Monitor platelets by anticoagulation protocol: Yes   Plan:  Hold coumadin tonight Monitor daily INR, CBC, s/s of bleed  Claris Pech J 04/07/2014,8:24 AM

## 2014-04-07 NOTE — Progress Notes (Signed)
Patient ID: Raymond [name], male   DOB: 03-03-1960, 54 y.o.   MRN: [id number]   54 yo with history of nonischemic cardiomyopathy, EF 10-15% by last echo and [employer] Jude CRT-D device, chronic atrial fibrillation s/p AV nodal ablation, h/o VT, CKD, CVA presented with VT/VF and syncope.  At last office visit, torsemide was increased and amiodarone was decreased to 200 daily.  He was hypokalemic at admission.    Since admission, he was shocked for VT on the evening of 3/12 and again on the evening of 3/13.  Seen by Dr Graciela Husbands and started on ranolazine.  Remains on milrinone at 0.375. Creatinine stable.  Diuretics to po on 3/19.  CVP 9-12.  Weight not changing but drinking a lot despite being told not to.  PT recommends rehab but he refuses.  No further NSVT.  Foot feeling better.   Scheduled Meds: . acyclovir  400 mg Oral Daily  . amiodarone  400 mg Oral BID  . cyclobenzaprine  10 mg Oral BID  . levothyroxine  250 mcg Oral QAC breakfast  . magnesium oxide  400 mg Oral Daily  . potassium chloride  60 mEq Oral BID  . ranolazine  1,000 mg Oral BID  . sodium chloride  10-40 mL Intracatheter Q12H  . sodium chloride  3 mL Intravenous Q12H  . spironolactone  25 mg Oral BID  . tolvaptan  15 mg Oral Q24H  . torsemide  80 mg Oral BID  . vancomycin  1,250 mg Intravenous Q24H  . Warfarin - Pharmacist Dosing Inpatient   Does not apply q1800   Continuous Infusions: . sodium chloride Stopped (04/01/14 0430)  . milrinone 0.375 mcg/kg/min (04/07/14 0800)   PRN Meds:.sodium chloride, acetaminophen, ALPRAZolam, guaiFENesin-codeine, guaiFENesin-dextromethorphan, nitroGLYCERIN, ondansetron (ZOFRAN) IV, oxyCODONE-acetaminophen, sodium chloride, sodium chloride   Filed Vitals:   04/07/14 0350 04/07/14 0418 04/07/14 0741 04/07/14 0800  BP: 119/62  118/74   Pulse:      Temp: 98 F (36.7 C)  97.7 F (36.5 C)   TempSrc: Oral  Oral   Resp:   20 18  Height:      Weight:  268 lb 15.4 oz (122 kg)    SpO2: 94%  95%       Intake/Output Summary (Last 24 hours) at 04/07/14 0829 Last data filed at 04/07/14 0800  Gross per 24 hour  Intake 2078.83 ml  Output   2225 ml  Net -146.17 ml    LABS: Basic Metabolic Panel:  Recent Labs  78/29/56 0431  04/06/14 0417 04/07/14 0418  NA  --   < > 131* 133*  K  --   < > 3.5 3.8  CL  --   < > 87* 88*  CO2  --   < > 33* 35*  GLUCOSE  --   < > 206* 133*  BUN  --   < > 33* 29*  CREATININE  --   < > 1.81* 1.78*  CALCIUM  --   < > 8.8 9.0  MG 1.8  --  2.6*  --   < > = values in this interval not displayed. Liver Function Tests: No results for input(s): AST, ALT, ALKPHOS, BILITOT, PROT, ALBUMIN in the last 72 hours. No results for input(s): LIPASE, AMYLASE in the last 72 hours. CBC:  Recent Labs  04/05/14 0430 04/06/14 0417  WBC 9.9 7.9  HGB 12.9* 13.0  HCT 39.5 39.2  MCV 92.3 92.2  PLT 260 278   Cardiac Enzymes:  No results for input(s): CKTOTAL, CKMB, CKMBINDEX, TROPONINI in the last 72 hours. BNP: Invalid input(s): POCBNP D-Dimer: No results for input(s): DDIMER in the last 72 hours. Hemoglobin A1C: No results for input(s): HGBA1C in the last 72 hours. Fasting Lipid Panel: No results for input(s): CHOL, HDL, LDLCALC, TRIG, CHOLHDL, LDLDIRECT in the last 72 hours. Thyroid Function Tests: No results for input(s): TSH, T4TOTAL, T3FREE, THYROIDAB in the last 72 hours.  Invalid input(s): FREET3 Anemia Panel: No results for input(s): VITAMINB12, FOLATE, FERRITIN, TIBC, IRON, RETICCTPCT in the last 72 hours.  RADIOLOGY: Dg Chest 2 View  03/28/2014   CLINICAL DATA:  Shortness of breath since last night.  EXAM: CHEST  2 VIEW  COMPARISON:  02/18/2014  FINDINGS: Pacer/AICD device.  A right internal jugular line terminates over the internal jugular vein. Midline trachea. Moderate cardiomegaly. No pleural effusion or pneumothorax. Mild interstitial edema. No lobar consolidation.  IMPRESSION: Mild congestive heart failure.  Right internal jugular line  with tip projecting over the low right neck. Presuming low SVC position is desired, this should be advanced approximately 11 cm.   Electronically Signed   By: Jeronimo Greaves M.D.   On: 03/28/2014 20:47   Dg Chest Port 1 View  04/02/2014   CLINICAL DATA:  Central line placement  EXAM: PORTABLE CHEST - 1 VIEW  COMPARISON:  03/30/2014  FINDINGS: Marked cardiac enlargement. Pulmonary vascular congestion and mild interstitial edema is unchanged. No significant effusion.  Right arm PICC tip enters the SVC with the tip not well visualized due to pacemaker leads.  Right jugular catheter tip overlies the neck at the cervical thoracic junction possibly and external jugular branch. This is unchanged.  IMPRESSION: Congestive heart failure with mild edema  Right arm PICC tip enters the SVC with the tip not seen due to overlying pacemaker leads  Right external jugular catheter tip remains in the lower neck unchanged from the prior study.   Electronically Signed   By: Marlan Palau M.D.   On: 04/02/2014 11:50   Dg Chest Port 1 View  03/30/2014   CLINICAL DATA:  54 year old male with a history of syncope. Report of AICD discharge  EXAM: PORTABLE CHEST - 1 VIEW  COMPARISON:  03/28/2014, fluoroscopy 02/21/2014  FINDINGS: Cardiomediastinal silhouette is similar to prior with cardiomegaly.  Central vascular congestion with right hilar opacities.  No pneumothorax.  No large pleural effusion. Retrocardiac region not well evaluated given the EKG leads, defibrillator pads, and overlying soft tissues of the chest wall.  Unchanged position of AICD generator on the left chest wall with 3 leads in place.  Right IJ tunneled central catheter has been withdrawn to the cervical internal jugular vein since the placement 02/21/2014.  IMPRESSION: Evidence of pulmonary vascular congestion/developing edema.  Cardiomegaly.  Interval withdrawal of the right IJ central venous catheter into the cervical internal jugular vein.  Overlying defibrillator  pads.  Signed,  Yvone Neu. Loreta Ave, DO  [facility]  [facility]   Electronically Signed   By: Gilmer Mor D.O.   On: 03/30/2014 11:33   Dg Foot Complete Right  04/03/2014   CLINICAL DATA:  Syncope and fall, injuring right foot 3 days ago  EXAM: RIGHT FOOT COMPLETE - 3+ VIEW  COMPARISON:  None.  FINDINGS: There is no evidence of fracture or dislocation. There is no evidence of arthropathy or other focal bone abnormality. Soft tissues are unremarkable.  IMPRESSION: Negative.   Electronically Signed   By: Rosey Bath.D.  On: 04/03/2014 00:51    PHYSICAL EXAM CVP 9-12 General: NAD Neck: JVP 9-10 cm, no thyromegaly or thyroid nodule.  Lungs: Clear to auscultation bilaterally with normal respiratory effort. CV: Nondisplaced PMI.  Heart irregular S1/S2 (frequent PVCs), no S3/S4, no murmur.  No peripheral edema.  No carotid bruit.  Normal pedal pulses.  Abdomen: Soft, nontender, no hepatosplenomegaly, no distention.  Neurologic: Alert and oriented x 3.  Psych: Normal affect. Extremities: No clubbing or cyanosis.  R foot erythematous (improving) and less tender to touch   TELEMETRY: Reviewed telemetry pt  underlying atrial fibrillation with v-pacing (s/p AVN ablation)  ASSESSMENT AND PLAN: 54 yo with history of nonischemic cardiomyopathy, EF 10-15% by last echo and St Jude CRT-D device, chronic atrial fibrillation s/p AV nodal ablation, h/o VT, CKD, CVA presented with VT/VF and syncope.  1. VT: Multiple episodes recently.  At last office visit, amiodarone was decreased and torsemide was increased.  He has been hypokalemic but now repleted.  No further NSVT.  K ok today.    - Remains on amiodarone IV. Will switch back to po today. - Continue ranolazine 1000 bid.  - Continue spironolactone to 25 mg bid..  - Continue KDur 60 bid.    2. Acute on chronic systolic CHF: EF 79-48% on 1/16 echo, nonischemic cardiomyopathy.  St Jude CRT-D device.  Not  good candidate for advanced therapies, has no caregiver at home. On home milrinone at 0.375 - Still drinking too much fluid but sodium better.   - Continue torsemide 80 mg bid.    - Continue home milrinone at 0.375. Co-ox slightly improved but still marginal. Will not increase due to ongoing NSVT/VT. Repeat co-ox in am.  3. Atrial fibrillation: Chronic, s/p AVN ablation.  Continue warfarin.  4. CKD: Creatinine stable at his baseline.  5. Foot pain: No fracture on imaging.  Now with probable cellulitis, on vancomycin.  Will change to doxycycline today.  6. Hyponatremia: With volume overload.  Stressed need to limit water and ice intake. Sodium ok today.  7. Disposition: Poorly mobile, primarily apparently due to foot pain.  He refuses rehab.  I will have PT see him again today, I think he will be ready for discharge tomorrow (to home if he continues to refuse rehab).    Marca Ancona MD 04/07/2014 8:29 AM

## 2014-04-08 DIAGNOSIS — R5381 Other malaise: Secondary | ICD-10-CM

## 2014-04-08 DIAGNOSIS — G8190 Hemiplegia, unspecified affecting unspecified side: Secondary | ICD-10-CM

## 2014-04-08 LAB — BASIC METABOLIC PANEL
Anion gap: 8 (ref 5–15)
BUN: 29 mg/dL — ABNORMAL HIGH (ref 6–23)
CO2: 35 mmol/L — ABNORMAL HIGH (ref 19–32)
Calcium: 9.3 mg/dL (ref 8.4–10.5)
Chloride: 94 mmol/L — ABNORMAL LOW (ref 96–112)
Creatinine, Ser: 1.77 mg/dL — ABNORMAL HIGH (ref 0.50–1.35)
GFR calc Af Amer: 50 mL/min — ABNORMAL LOW (ref >90)
GFR calc non Af Amer: 43 mL/min — ABNORMAL LOW (ref >90)
Glucose, Bld: 119 mg/dL — ABNORMAL HIGH (ref 70–99)
Potassium: 4.1 mmol/L (ref 3.5–5.1)
Sodium: 137 mmol/L (ref 135–145)

## 2014-04-08 LAB — PROTIME-INR
INR: 3.15 — AB (ref 0.00–1.49)
PROTHROMBIN TIME: 32.6 s — AB (ref 11.6–15.2)

## 2014-04-08 LAB — GLUCOSE, CAPILLARY: Glucose-Capillary: 123 mg/dL — ABNORMAL HIGH (ref 70–99)

## 2014-04-08 LAB — CBC
HCT: 40.3 % (ref 39.0–52.0)
Hemoglobin: 13.3 g/dL (ref 13.0–17.0)
MCH: 30.4 pg (ref 26.0–34.0)
MCHC: 33 g/dL (ref 30.0–36.0)
MCV: 92 fL (ref 78.0–100.0)
Platelets: 332 10*3/uL (ref 150–400)
RBC: 4.38 MIL/uL (ref 4.22–5.81)
RDW: 15.4 % (ref 11.5–15.5)
WBC: 7.7 10*3/uL (ref 4.0–10.5)

## 2014-04-08 LAB — CARBOXYHEMOGLOBIN
Carboxyhemoglobin: 1 % (ref 0.5–1.5)
Methemoglobin: 0.9 % (ref 0.0–1.5)
O2 SAT: 56 %
Total hemoglobin: 13.7 g/dL (ref 13.5–18.0)

## 2014-04-08 NOTE — Progress Notes (Signed)
Physical Therapy Treatment Patient Details Name: Raymond [name] MRN: [id number] DOB: 06-07-1960 Today's Date: 04-13-2014    History of Present Illness Pt adm with syncope and vfib/vtach with ICD shock x 3. Pt also developed rt foot pain thought to be cellulitis. 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 Verbal encouragement needed to participate.   Follow Up Recommendations  CIR     Equipment Recommendations  Other (comment) (to be assessed)    Recommendations for Other Services       Precautions / Restrictions Precautions Precautions: Fall    Mobility  Bed Mobility Overal bed mobility: Needs Assistance Bed Mobility: Supine to Sit     Supine to sit: Min assist;HOB elevated     General bed mobility comments: Assist to raise trunk to sitting.  Transfers Overall transfer level: Needs assistance Equipment used: Left platform walker Transfers: Sit to/from Stand Sit to Stand: Min assist;+2 physical assistance         General transfer comment: Assist to bring hips up and for balance.  Ambulation/Gait   Ambulation Distance (Feet): 15 Feet Assistive device: Left platform walker Gait Pattern/deviations: Step-to pattern;Decreased step length - left;Decreased step length - right;Decreased stance time - right;Trunk flexed   Gait velocity interpretation: Below normal speed for age/gender General Gait Details: Verbal cues to stand more erect and incr step length on lt. Assist for balance and to manuever walker.    Stairs            Wheelchair Mobility    Modified Rankin (Stroke Patients Only)       Balance Overall balance assessment: Needs assistance Sitting-balance support: No upper extremity supported Sitting balance-Leahy Scale: Fair     Standing balance support: Bilateral upper extremity supported Standing balance-Leahy Scale: Poor                      Cognition Arousal/Alertness: Awake/alert Behavior During Therapy:  (pt easily irrated ) Overall Cognitive Status: Within Functional Limits for tasks assessed                      Exercises      General Comments        Pertinent Vitals/Pain      Home Living                      Prior Function            PT Goals (current goals can now be found in the care plan section) Progress towards PT goals: Progressing toward goals    Frequency  Min 3X/week    PT Plan Discharge plan needs to be updated    Co-evaluation             End of Session Equipment Utilized During Treatment: Gait belt Activity Tolerance: Patient tolerated treatment well Patient left: in chair;with call bell/phone within reach     Time: 1221-1238 PT Time Calculation (min) (ACUTE ONLY): 17 min  Charges:  $Gait Training: 8-22 mins                    G Codes:      Jaquasha Carnevale 2014/04/13, 1:28 PM  [employer] PT [phone]

## 2014-04-08 NOTE — Progress Notes (Signed)
Inpatient Rehabilitation  I note recommendations from Dr. Riley Kill' consult.  I will follow up with the patient in the am.    Weldon Picking PT Inpatient Rehab Admissions Coordinator Cell [phone] Office [phone]

## 2014-04-08 NOTE — Consult Note (Signed)
Physical Medicine and Rehabilitation Consult  Reason for Consult: Debility  Due to multiple medical issues.  Referring Physician: Dr. Shirlee Latch   HPI: Raymond [name] is a 54 y.o. 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 LOV was 03/27/14 and his Amiodarone was cut back to 200 mg daily and his Torsemide was increased to 40 mg BID and spironolactone was added.  Since admission, he was shocked for VT on the evening of 3/12 and again on the evening of 3/13.  He was evaluated by Dr Graciela Husbands and started on ranolazine and has been transitioned back to po amiodarone. He was diuresed with milrinone and lasix for acute on chronic systolic CHF with good results. He has had foot pain due to cellulitis and was started on doxycyline for treatment. Therapy initiated and patient limited by right foot pain with inability to weight bear on RLE as well as left hemiparesis due to prior CVA. MD recommending CIR for follow up therapy.    Review of Systems  HENT: Negative for hearing loss.   Eyes: Negative for blurred vision.  Respiratory: Negative for cough and shortness of breath.   Cardiovascular: Negative for chest pain, palpitations and leg swelling.  Gastrointestinal: Positive for constipation (hasn't had BM for 3 days). Negative for heartburn and nausea.  Musculoskeletal: Positive for myalgias.  Neurological: Positive for focal weakness (left hemiparesis) and weakness. Negative for dizziness and headaches.  Psychiatric/Behavioral: Positive for depression. The patient is nervous/anxious and has insomnia (hasn't slept for 3 days).       Past Medical History  Diagnosis Date  . Non-ischemic cardiomyopathy     a. echo 11/10: EF 15%;   b. cath 10/08: normal cors;  c. Myoview 5/12:  Very mild distal ant and apical isch, EF 17% (low risk-medical Rx cont'd);  d.  Echo 4/14:  EF 15%;  e. 05/2009 s/p SJM BiV ICD;  f. 07/2012 TEE: EF 20-25%, diff HK mild MR, mod TR.   Marland Kitchen Atrial fibrillation     a. on coumadin;  b. 07/2012 s/p TEE/DCCV. c. Recurrence 08/2012 in setting of abnormal thyroid panel.; 12/2012 AVN ablation by Dr [name]  . Hypertension   . Obesity   . Non-compliance   . Dyslexia     unable to read.  Marland Kitchen History of CVA (cerebrovascular accident) 11/2008    a. 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 RBBB (right bundle branch block)   . Cough syncope     a. During 08/2012 adm.  . Systolic CHF, chronic 01/19/1996  . Hypothyroidism 01/19/1996    s/p RAI therapy  . Stroke 01/19/2008  . Renal insufficiency     Past Surgical History  Procedure Laterality Date  . Bi-ventricular implantable cardioverter defibrillator  (crt-d)  06/11/09    [employer] [id number] BIVENTRICULAR AICD SERIAL #[id number]  . Knee arthroscopy Left ~ 1995  . Cardiac catheterization      "several time" (09/01/2012)  . Tee without cardioversion N/A 06/23/2012    Procedure: TRANSESOPHAGEAL ECHOCARDIOGRAM (TEE);  Surgeon: Vesta Mixer, MD;  Location: [facility] ENDOSCOPY;  Service: Cardiovascular;  Laterality: N/A;  TEE will be done at 0730   . Cardioversion N/A 07/27/2012    Procedure: CARDIOVERSION;  Surgeon: Vesta Mixer, MD;  Location: [facility] ENDOSCOPY;  Service: Cardiovascular;  Laterality: N/A;  . Tee without cardioversion N/A 08/10/2012    Procedure: TRANSESOPHAGEAL ECHOCARDIOGRAM (TEE);  Surgeon: Wendall Stade, MD;  Location: [facility] ENDOSCOPY;  Service: Cardiovascular;  Laterality: N/A;  . Cardioversion N/A 08/10/2012    Procedure: CARDIOVERSION;  Surgeon: Wendall Stade, MD;  Location: [facility] ENDOSCOPY;  Service: Cardiovascular;  Laterality: N/A;  . Sp pta add intra cran  11/2008    a. 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 Ablation  01/09/2013    AVN ablation by Dr  [name]  . Av node ablation N/A 01/09/2013    Procedure: AV NODE ABLATION;  Surgeon: Marinus Maw, MD;  Location: [facility] CATH LAB;  Service: Cardiovascular;  Laterality: N/A;  . Right heart catheterization N/A 02/15/2014    Procedure: RIGHT HEART CATH;  Surgeon: Dolores Patty, MD;  Location: [facility] CATH LAB;  Service: Cardiovascular;  Laterality: N/A;    Family History  Problem Relation Age of Onset  . Heart disease Father 21    AMI  . Diabetes Father   . Stroke Father 48    CVA x 2  . Diabetes Sister   . Atrial fibrillation Sister   . Hyperlipidemia Mother   . Hypertension Mother   . Heart disease Mother 26    CHF     Social History:   Lives alone and independent with cane PTA. Girlfriend assists with home management and meals. Mother lives out of town. He reports that he has never smoked. He has never used smokeless tobacco. He reports that he drinks alcohol. He reports that he does not use illicit drugs.    Allergies  Allergen Reactions  . Valsartan Cough   Medications Prior to Admission  Medication Sig Dispense Refill  . acyclovir (ZOVIRAX) 800 MG tablet Take 400 mg by mouth daily.     Marland Kitchen ALPRAZolam (XANAX) 0.25 MG tablet Take 1 tablet (0.25 mg total) by mouth at bedtime as needed for anxiety. 30 tablet 2  . amiodarone (PACERONE) 200 MG tablet Take 1 tablet (200 mg total) by mouth daily. 60 tablet 6  . cyclobenzaprine (FLEXERIL) 10 MG tablet Take 1 tablet (10 mg total) by mouth 2 (two) times daily. 20 tablet 0  . DM-Doxylamine-Acetaminophen (NYQUIL COLD & FLU PO) Take 30 mLs by mouth daily as needed (for cold).    Marland Kitchen levothyroxine (SYNTHROID, LEVOTHROID) 125 MCG tablet Take 2 tablets (250 mcg total) by mouth daily before breakfast. Additional refill should come from PCP 60 tablet 3  . Menthol (RICOLA) LOZG Use as directed 1 lozenge in the mouth or throat daily as needed (for cough).    . milrinone (PRIMACOR) 20 MG/100ML SOLN infusion Inject 48.45 mcg/min into the vein  continuous. 100 mL   . potassium chloride (K-DUR) 10 MEQ tablet Take 4 tablets (40 mEq total) by mouth daily. 40 tablet 0  . spironolactone (ALDACTONE) 25 MG tablet Take 1 tablet (25 mg total) by mouth daily. 90 tablet 3  . torsemide (DEMADEX) 20 MG tablet Take 3 tablets (60 mg total) by mouth 2 (two) times daily. 60 tablet 3  . warfarin (COUMADIN) 7.5 MG tablet Take 1 tablet daily (Patient taking differently: Take 7.5-11.25 mg by mouth daily at 6 PM. Takes 7.5mg  on Mon, Wed and Fri Takes 11.25mg  all other days) 40 tablet 3  . ZAROXOLYN 2.5 MG tablet Take 1 tablet (2.5 mg total) by mouth daily. 1  tablet 0  . HYDROcodone-acetaminophen (NORCO/VICODIN) 5-325 MG per tablet TAKE 1 TABLET BY MOUTH EVERY 8 HOURS AS NEEDED (Patient not taking: Reported on 03/30/2014) 15 tablet 0    Home: Home Living Family/patient expects to be discharged to:: Private residence Living Arrangements: Spouse/significant other Available Help at Discharge: Family, Available PRN/intermittently Type of Home: House Home Access: Stairs to enter Secretary/administrator of Steps: 1 Home Layout: Two level, Able to live on main level with bedroom/bathroom Home Equipment: Cane - quad, Cane - single point, Grab bars - tub/shower  Functional History: Prior Function Level of Independence: Independent with assistive device(s) Comments: Amb with straight cane Functional Status:  Mobility: Bed Mobility Overal bed mobility: Needs Assistance Bed Mobility: Supine to Sit, Sit to Supine Supine to sit: Min assist, HOB elevated Sit to supine: Min assist, HOB elevated General bed mobility comments: Assist to raise trunk to sitting position.  To return to supine, assist to bring BLE's on to bed.  Required +2 max assist to scoot up to [facility]. Transfers Overall transfer level: Needs assistance Equipment used: Straight cane Transfers: Sit to/from Stand, Stand Pivot Transfers Sit to Stand: Min assist, +2 physical assistance Stand pivot  transfers: Mod assist, +2 physical assistance General transfer comment: Patient able to move to standing from bed and recliner with +2 min assist to power up and for balance.  Min assist for balance in static standing.  Patient attempted ambulation - able to take only 2 steps.  On pivoting to chair, patient's Rt LE buckled, requiring +2 mod assist to get to chair and control descent.  Moved chair closer to bed.  Patient able to pivot to bed with min assist. Ambulation/Gait Ambulation/Gait assistance: Min assist, +2 physical assistance Ambulation Distance (Feet): 2 Feet Assistive device: Straight cane Gait Pattern/deviations: Step-to pattern, Decreased step length - right, Decreased step length - left, Decreased stride length, Shuffle, Trunk flexed General Gait Details: Patient able to take 2 steps with +2 assist.  Unable to progress due to pain and weakness.    ADL:    Cognition: Cognition Overall Cognitive Status: Within Functional Limits for tasks assessed Orientation Level: Oriented X4 Cognition Arousal/Alertness: Awake/alert Behavior During Therapy: Agitated Overall Cognitive Status: Within Functional Limits for tasks assessed  Blood pressure 134/75, pulse 74, temperature 97.4 F (36.3 C), temperature source Oral, resp. rate 20, height  (1.753 m), weight 119.75 kg (264 lb), SpO2 95 %. Physical Exam  Nursing note and vitals reviewed. Constitutional: He is oriented to person, place, and time. He appears well-developed and well-nourished.  HENT:  Head: Normocephalic and atraumatic.  Eyes: Conjunctivae and EOM are normal. Pupils are equal, round, and reactive to light.  Neck: No tracheal deviation present. No thyromegaly present.  Cardiovascular: Normal rate and regular rhythm.  Exam reveals no friction rub.   No murmur heard. Respiratory: Effort normal and breath sounds normal. No respiratory distress. He has no wheezes.  GI: Soft. Bowel sounds are normal. He exhibits no  distension. There is no tenderness.  Musculoskeletal: He exhibits no edema or tenderness.  Right foot with erythema at heel and laterally.  No pain with ROM.   Neurological: He is alert and oriented to person, place, and time.  Slow measured speech. He was able to follow basic commands without difficulty. Left hemiparesis UE> LE with sensory deficits. LUE 2+ to 3-/5. LLE: 3/5 HF, 4-KE and 2+ aDF, 3+apf, RUE and RLE 4 to 4+/5, some limitations with movement of right foot. Mild left facial droop. Speech  clear.   Skin: Skin is warm and dry.  Psychiatric: His mood appears anxious. His affect is blunt. His speech is delayed. He is slowed.    Results for orders placed or performed during the hospital encounter of 03/30/14 (from the past 24 hour(s))  Protime-INR     Status: Abnormal   Collection Time: 04/08/14  3:35 AM  Result Value Ref Range   Prothrombin Time 32.6 (H) 11.6 - 15.2 seconds   INR 3.15 (H) 0.00 - 1.49  CBC     Status: None   Collection Time: 04/08/14  3:35 AM  Result Value Ref Range   WBC 7.7 4.0 - 10.5 K/uL   RBC 4.38 4.22 - 5.81 MIL/uL   Hemoglobin 13.3 13.0 - 17.0 g/dL   HCT 16.1 09.6 - 04.5 %   MCV 92.0 78.0 - 100.0 fL   MCH 30.4 26.0 - 34.0 pg   MCHC 33.0 30.0 - 36.0 g/dL   RDW 40.9 81.1 - 91.4 %   Platelets 332 150 - 400 K/uL  Basic metabolic panel     Status: Abnormal   Collection Time: 04/08/14  3:35 AM  Result Value Ref Range   Sodium 137 135 - 145 mmol/L   Potassium 4.1 3.5 - 5.1 mmol/L   Chloride 94 (L) 96 - 112 mmol/L   CO2 35 (H) 19 - 32 mmol/L   Glucose, Bld 119 (H) 70 - 99 mg/dL   BUN 29 (H) 6 - 23 mg/dL   Creatinine, Ser 7.82 (H) 0.50 - 1.35 mg/dL   Calcium 9.3 8.4 - 95.6 mg/dL   GFR calc non Af Amer 43 (L) >90 mL/min   GFR calc Af Amer 50 (L) >90 mL/min   Anion gap 8 5 - 15  Carboxyhemoglobin     Status: None   Collection Time: 04/08/14  3:37 AM  Result Value Ref Range   Total hemoglobin 13.7 13.5 - 18.0 g/dL   O2 Saturation 21.3 %    Carboxyhemoglobin 1.0 0.5 - 1.5 %   Methemoglobin 0.9 0.0 - 1.5 %   No results found.  Assessment/Plan: Diagnosis: debility after multiple medical/chf. Right foot cellulitis 1. Does the need for close, 24 hr/day medical supervision in concert with the patient's rehab needs make it unreasonable for this patient to be served in a less intensive setting? Yes 2. Co-Morbidities requiring supervision/potential complications: hx of right CVA, htn, ckd, multiple cardiac dx'es 3. Due to bladder management, bowel management, safety, skin/wound care, disease management, medication administration, pain management and patient education, does the patient require 24 hr/day rehab nursing? Yes 4. Does the patient require coordinated care of a physician, rehab nurse, PT (1-2 hrs/day, 5 days/week) and OT (1-2 hrs/day, 5 days/week) to address physical and functional deficits in the context of the above medical diagnosis(es)? Yes Addressing deficits in the following areas: balance, endurance, locomotion, strength, transferring, bowel/bladder control, bathing, dressing, feeding, grooming, toileting and psychosocial support 5. Can the patient actively participate in an intensive therapy program of at least 3 hrs of therapy per day at least 5 days per week? Yes 6. The potential for patient to make measurable gains while on inpatient rehab is excellent 7. Anticipated functional outcomes upon discharge from inpatient rehab are modified independent  with PT, modified independent with OT, n/a with SLP. 8. Estimated rehab length of stay to reach the above functional goals is: 7 days 9. Does the patient have adequate social supports and living environment to accommodate these discharge functional goals? Yes 10.  Anticipated D/C setting: Home 11. Anticipated post D/C treatments: HH therapy and Outpatient therapy 12. Overall Rehab/Functional Prognosis: excellent  RECOMMENDATIONS: This patient's condition is appropriate for  continued rehabilitative care in the following setting: CIR Patient has agreed to participate in recommended program. Yes Note that insurance prior authorization may be required for reimbursement for recommended care.  Comment: Rehab Admissions Coordinator to follow up.  Thanks,  [name], MD, [place] [facility]     04/08/2014

## 2014-04-08 NOTE — Progress Notes (Signed)
ANTICOAGULATION CONSULT NOTE - Follow Up Consult  Pharmacy Consult for Coumadin Indication: Afib / Stroke  Allergies  Allergen Reactions  . Valsartan Cough    Patient Measurements: Height: 5\' 9"  (175.3 cm) Weight: 264 lb (119.75 kg) IBW/kg (Calculated) : 70.7  Vital Signs: Temp: 97.4 F (36.3 C) (03/21 0329) Temp Source: Oral (03/21 0329) BP: 134/75 mmHg (03/21 0754) Pulse Rate: 74 (03/21 0754)  Labs:  Recent Labs  04/06/14 0417 04/07/14 0418 04/08/14 0335  HGB 13.0  --  13.3  HCT 39.2  --  40.3  PLT 278  --  332  LABPROT 26.4* 30.2* 32.6*  INR 2.40* 2.86* 3.15*  CREATININE 1.81* 1.78* 1.77*    Estimated Creatinine Clearance: 63.1 mL/min (by C-G formula based on Cr of 1.77).  Assessment: 54 yo M admitted 03/30/2014 for VT shock - in setting low K. Pharmacy consulted to dose warfarin.  PMH: Afib, NICM, EF 15% ICD with Hx VT  AC: warf for chronic AFib - CVR - INR 1.65 on admit > subtherap> > held 3 days. Restarted on 3/18 and now supratherapeutic. CBC stable no bleeding  PTA warf 7.5mg  MWF, 11.25mg  TTSS  (consider resuming at [facility], The lower dose when INR drops into goal)  ID: foot Cellulitis, Afeb, WBC down to wnl  3/17 vanc>> acyclovir   Goal of Therapy:  INR 2-3 Monitor platelets by anticoagulation protocol: Yes   Plan:  Hold coumadin tonight Monitor daily INR, CBC, s/s of bleed Follow up duration of therapy with Vancomycin for celluliis  Thank you for allowing pharmacy to be a part of this patients care team.  Lovenia Kim Pharm.D., BCPS, AQ-Cardiology Clinical Pharmacist 04/08/2014 11:28 AM Pager: [phone] Phone: [phone]

## 2014-04-08 NOTE — Progress Notes (Signed)
Patient ID: Raymond [name], male   DOB: Jun 15, 1960, 54 y.o.   MRN: [id number]   54 yo with history of nonischemic cardiomyopathy, EF 10-15% by last echo and St Jude CRT-D device, chronic atrial fibrillation s/p AV nodal ablation, h/o VT, CKD, CVA presented with VT/VF and syncope.  At last office visit, torsemide was increased and amiodarone was decreased to 200 daily.  He was hypokalemic at admission.    Since admission, he was shocked for VT on the evening of 3/12 and again on the evening of 3/13.  Seen by Dr Graciela Husbands and started on ranolazine.  Remains on milrinone at 0.375. Creatinine stable.  Diuretics to po on 3/19.  CVP 8 today, good diuresis yesterday.  Weight down 4 lbs.  No VT/NSVT.  Overall doing better but still very unstable on his feet.  Scheduled Meds: . acyclovir  400 mg Oral Daily  . amiodarone  400 mg Oral BID  . cyclobenzaprine  10 mg Oral BID  . doxycycline  100 mg Oral Q12H  . levothyroxine  250 mcg Oral QAC breakfast  . magnesium oxide  400 mg Oral Daily  . potassium chloride  60 mEq Oral BID  . ranolazine  1,000 mg Oral BID  . sodium chloride  10-40 mL Intracatheter Q12H  . sodium chloride  3 mL Intravenous Q12H  . spironolactone  25 mg Oral BID  . torsemide  80 mg Oral BID  . Warfarin - Pharmacist Dosing Inpatient   Does not apply q1800   Continuous Infusions: . sodium chloride Stopped (04/01/14 0430)  . milrinone 0.375 mcg/kg/min (04/08/14 0700)   PRN Meds:.sodium chloride, acetaminophen, ALPRAZolam, guaiFENesin-codeine, guaiFENesin-dextromethorphan, nitroGLYCERIN, ondansetron (ZOFRAN) IV, oxyCODONE-acetaminophen, sodium chloride, sodium chloride   Filed Vitals:   04/07/14 1941 04/07/14 2338 04/08/14 0300 04/08/14 0329  BP: 131/69 121/81  86/60  Pulse:      Temp: 96.4 F (35.8 C) 97 F (36.1 C)  97.4 F (36.3 C)  TempSrc: Oral Oral  Oral  Resp: Height:      Weight:    264 lb (119.75 kg)  SpO2: 95% 96%  96%    Intake/Output Summary (Last 24  hours) at 04/08/14 0731 Last data filed at 04/08/14 0700  Gross per 24 hour  Intake 1358.1 ml  Output   3525 ml  Net -2166.9 ml    LABS: Basic Metabolic Panel:  Recent Labs  95/62/13 0417 04/07/14 0418 04/08/14 0335  NA 131* 133* 137  K 3.5 3.8 4.1  CL 87* 88* 94*  CO2 33* 35* 35*  GLUCOSE 206* 133* 119*  BUN 33* 29* 29*  CREATININE 1.81* 1.78* 1.77*  CALCIUM 8.8 9.0 9.3  MG 2.6*  --   --    Liver Function Tests: No results for input(s): AST, ALT, ALKPHOS, BILITOT, PROT, ALBUMIN in the last 72 hours. No results for input(s): LIPASE, AMYLASE in the last 72 hours. CBC:  Recent Labs  04/06/14 0417 04/08/14 0335  WBC 7.9 7.7  HGB 13.0 13.3  HCT 39.2 40.3  MCV 92.2 92.0  PLT 278 332   Cardiac Enzymes: No results for input(s): CKTOTAL, CKMB, CKMBINDEX, TROPONINI in the last 72 hours. BNP: Invalid input(s): POCBNP D-Dimer: No results for input(s): DDIMER in the last 72 hours. Hemoglobin A1C: No results for input(s): HGBA1C in the last 72 hours. Fasting Lipid Panel: No results for input(s): CHOL, HDL, LDLCALC, TRIG, CHOLHDL, LDLDIRECT in the last 72 hours. Thyroid Function Tests: No results for  input(s): TSH, T4TOTAL, T3FREE, THYROIDAB in the last 72 hours.  Invalid input(s): FREET3 Anemia Panel: No results for input(s): VITAMINB12, FOLATE, FERRITIN, TIBC, IRON, RETICCTPCT in the last 72 hours.  RADIOLOGY: Dg Chest 2 View  03/28/2014   CLINICAL DATA:  Shortness of breath since last night.  EXAM: CHEST  2 VIEW  COMPARISON:  02/18/2014  FINDINGS: Pacer/AICD device.  A right internal jugular line terminates over the internal jugular vein. Midline trachea. Moderate cardiomegaly. No pleural effusion or pneumothorax. Mild interstitial edema. No lobar consolidation.  IMPRESSION: Mild congestive heart failure.  Right internal jugular line with tip projecting over the low right neck. Presuming low SVC position is desired, this should be advanced approximately 11 cm.    Electronically Signed   By: Jeronimo Greaves M.D.   On: 03/28/2014 20:47   Dg Chest Port 1 View  04/02/2014   CLINICAL DATA:  Central line placement  EXAM: PORTABLE CHEST - 1 VIEW  COMPARISON:  03/30/2014  FINDINGS: Marked cardiac enlargement. Pulmonary vascular congestion and mild interstitial edema is unchanged. No significant effusion.  Right arm PICC tip enters the SVC with the tip not well visualized due to pacemaker leads.  Right jugular catheter tip overlies the neck at the cervical thoracic junction possibly and external jugular branch. This is unchanged.  IMPRESSION: Congestive heart failure with mild edema  Right arm PICC tip enters the SVC with the tip not seen due to overlying pacemaker leads  Right external jugular catheter tip remains in the lower neck unchanged from the prior study.   Electronically Signed   By: Marlan Palau M.D.   On: 04/02/2014 11:50   Dg Chest Port 1 View  03/30/2014   CLINICAL DATA:  54 year old male with a history of syncope. Report of AICD discharge  EXAM: PORTABLE CHEST - 1 VIEW  COMPARISON:  03/28/2014, fluoroscopy 02/21/2014  FINDINGS: Cardiomediastinal silhouette is similar to prior with cardiomegaly.  Central vascular congestion with right hilar opacities.  No pneumothorax.  No large pleural effusion. Retrocardiac region not well evaluated given the EKG leads, defibrillator pads, and overlying soft tissues of the chest wall.  Unchanged position of AICD generator on the left chest wall with 3 leads in place.  Right IJ tunneled central catheter has been withdrawn to the cervical internal jugular vein since the placement 02/21/2014.  IMPRESSION: Evidence of pulmonary vascular congestion/developing edema.  Cardiomegaly.  Interval withdrawal of the right IJ central venous catheter into the cervical internal jugular vein.  Overlying defibrillator pads.  Signed,  Yvone Neu. Loreta Ave, DO  [facility]  [facility]   Electronically  Signed   By: Gilmer Mor D.O.   On: 03/30/2014 11:33   Dg Foot Complete Right  04/03/2014   CLINICAL DATA:  Syncope and fall, injuring right foot 3 days ago  EXAM: RIGHT FOOT COMPLETE - 3+ VIEW  COMPARISON:  None.  FINDINGS: There is no evidence of fracture or dislocation. There is no evidence of arthropathy or other focal bone abnormality. Soft tissues are unremarkable.  IMPRESSION: Negative.   Electronically Signed   By: Ellery Plunk M.D.   On: 04/03/2014 00:51    PHYSICAL EXAM CVP 8 General: NAD Neck: JVP 8-9 cm, no thyromegaly or thyroid nodule.  Lungs: Clear to auscultation bilaterally with normal respiratory effort. CV: Nondisplaced PMI.  Heart irregular S1/S2 (frequent PVCs), no S3/S4, no murmur.  No peripheral edema.  No carotid bruit.  Normal pedal pulses.  Abdomen: Soft, nontender, no hepatosplenomegaly,  no distention.  Neurologic: Alert and oriented x 3.  Psych: Normal affect. Extremities: No clubbing or cyanosis.  R foot erythematous (improving) and less tender to touch   TELEMETRY: Reviewed telemetry pt underlying atrial fibrillation with v-pacing (s/p AVN ablation)  ASSESSMENT AND PLAN: 54 yo with history of nonischemic cardiomyopathy, EF 10-15% by last echo and St Jude CRT-D device, chronic atrial fibrillation s/p AV nodal ablation, h/o VT, CKD, CVA presented with VT/VF and syncope.  1. VT: Multiple episodes recently.  At last office visit, amiodarone was decreased and torsemide was increased.  He has been hypokalemic but now repleted.  No further NSVT.  K ok today.    - Continue po amiodarone. - Continue ranolazine 1000 bid.  - Continue spironolactone 25 mg bid..  - Continue KDur 60 bid.    2. Acute on chronic systolic CHF: EF 58-85% on 1/16 echo, nonischemic cardiomyopathy.  St Jude CRT-D device.  Not good candidate for advanced therapies, has no caregiver at home. On home milrinone at 0.375 - Still drinking too much fluid but sodium better.   - Continue torsemide  80 mg bid.    - Continue home milrinone at 0.375. Co-ox slightly improved but still marginal at 56%. Will not increase due to ongoing NSVT/VT.  3. Atrial fibrillation: Chronic, s/p AVN ablation.  Continue warfarin.  4. CKD: Creatinine stable at his baseline.  5. Foot pain: No fracture on imaging.  Now with probable cellulitis, on doxycycline to complete 14 day course.  6. Hyponatremia: With volume overload.  Stressed need to limit water and ice intake. Sodium ok today.  7. Disposition: Poorly mobile.  He is now agreeing to PT, would prefer inpatient rehab but not sure if he will be able to do this.  Will make referral and also referral to social work.  He can go when a location is decided upon.  Followup 1 week CHF clinic, also needs followup coumadin clinic.  Home meds: Milrinone 0.375 mcg/kg/min, amiodarone 200 mg bid, ranolazine 1000 mg bid, spironolactone 25 mg bid, Kdur 60 bid, magnesium oxide 400 daily, torsemide 80 bid, doxycycline 100 bid to complete 14 days, warfarin per coumadin clinic, noncardiac meds as prior to admission.   Marca Ancona MD 04/08/2014 7:31 AM

## 2014-04-09 ENCOUNTER — Inpatient Hospital Stay (HOSPITAL_COMMUNITY)
Admission: RE | Admit: 2014-04-09 | Discharge: 2014-04-20 | DRG: 056 | Disposition: A | Discharge status: Home / Self Care | Payer: Medicare Other | Source: Intra-hospital | Attending: Physical Medicine & Rehabilitation | Admitting: Physical Medicine & Rehabilitation

## 2014-04-09 ENCOUNTER — Ambulatory Visit: Payer: Self-pay

## 2014-04-09 DIAGNOSIS — G8194 Hemiplegia, unspecified affecting left nondominant side: Secondary | ICD-10-CM

## 2014-04-09 DIAGNOSIS — L03115 Cellulitis of right lower limb: Secondary | ICD-10-CM | POA: Diagnosis present

## 2014-04-09 DIAGNOSIS — K5900 Constipation, unspecified: Secondary | ICD-10-CM | POA: Diagnosis present

## 2014-04-09 DIAGNOSIS — N189 Chronic kidney disease, unspecified: Secondary | ICD-10-CM | POA: Diagnosis present

## 2014-04-09 DIAGNOSIS — F419 Anxiety disorder, unspecified: Secondary | ICD-10-CM | POA: Diagnosis present

## 2014-04-09 DIAGNOSIS — I69354 Hemiplegia and hemiparesis following cerebral infarction affecting left non-dominant side: Principal | ICD-10-CM

## 2014-04-09 DIAGNOSIS — R5381 Other malaise: Secondary | ICD-10-CM

## 2014-04-09 DIAGNOSIS — M109 Gout, unspecified: Secondary | ICD-10-CM | POA: Diagnosis present

## 2014-04-09 DIAGNOSIS — E039 Hypothyroidism, unspecified: Secondary | ICD-10-CM | POA: Diagnosis present

## 2014-04-09 DIAGNOSIS — Z888 Allergy status to other drugs, medicaments and biological substances status: Secondary | ICD-10-CM

## 2014-04-09 DIAGNOSIS — I482 Chronic atrial fibrillation: Secondary | ICD-10-CM | POA: Diagnosis present

## 2014-04-09 DIAGNOSIS — Z79899 Other long term (current) drug therapy: Secondary | ICD-10-CM

## 2014-04-09 DIAGNOSIS — I5022 Chronic systolic (congestive) heart failure: Secondary | ICD-10-CM

## 2014-04-09 DIAGNOSIS — Z6839 Body mass index (BMI) 39.0-39.9, adult: Secondary | ICD-10-CM

## 2014-04-09 DIAGNOSIS — I5023 Acute on chronic systolic (congestive) heart failure: Secondary | ICD-10-CM | POA: Diagnosis present

## 2014-04-09 DIAGNOSIS — I429 Cardiomyopathy, unspecified: Secondary | ICD-10-CM | POA: Diagnosis not present

## 2014-04-09 DIAGNOSIS — I129 Hypertensive chronic kidney disease with stage 1 through stage 4 chronic kidney disease, or unspecified chronic kidney disease: Secondary | ICD-10-CM | POA: Diagnosis present

## 2014-04-09 DIAGNOSIS — E669 Obesity, unspecified: Secondary | ICD-10-CM | POA: Diagnosis present

## 2014-04-09 DIAGNOSIS — E875 Hyperkalemia: Secondary | ICD-10-CM | POA: Diagnosis not present

## 2014-04-09 DIAGNOSIS — E876 Hypokalemia: Secondary | ICD-10-CM | POA: Diagnosis present

## 2014-04-09 DIAGNOSIS — G8190 Hemiplegia, unspecified affecting unspecified side: Secondary | ICD-10-CM

## 2014-04-09 DIAGNOSIS — Z550 Illiteracy and low-level literacy: Secondary | ICD-10-CM | POA: Diagnosis present

## 2014-04-09 DIAGNOSIS — I428 Other cardiomyopathies: Secondary | ICD-10-CM

## 2014-04-09 DIAGNOSIS — K5901 Slow transit constipation: Secondary | ICD-10-CM | POA: Diagnosis present

## 2014-04-09 LAB — CARBOXYHEMOGLOBIN
CARBOXYHEMOGLOBIN: 1.1 % (ref 0.5–1.5)
Carboxyhemoglobin: 1.3 % (ref 0.5–1.5)
Methemoglobin: 0.7 % (ref 0.0–1.5)
Methemoglobin: 0.8 % (ref 0.0–1.5)
O2 SAT: 45.2 %
O2 Saturation: 44.7 %
TOTAL HEMOGLOBIN: 13.9 g/dL (ref 13.5–18.0)
Total hemoglobin: 13.6 g/dL (ref 13.5–18.0)

## 2014-04-09 LAB — BASIC METABOLIC PANEL
ANION GAP: 6 (ref 5–15)
BUN: 24 mg/dL — ABNORMAL HIGH (ref 6–23)
CO2: 32 mmol/L (ref 19–32)
Calcium: 9.5 mg/dL (ref 8.4–10.5)
Chloride: 98 mmol/L (ref 96–112)
Creatinine, Ser: 1.57 mg/dL — ABNORMAL HIGH (ref 0.50–1.35)
GFR, EST AFRICAN AMERICAN: 57 mL/min — AB (ref >90)
GFR, EST NON AFRICAN AMERICAN: 49 mL/min — AB (ref >90)
GLUCOSE: 194 mg/dL — AB (ref 70–99)
POTASSIUM: 4.3 mmol/L (ref 3.5–5.1)
SODIUM: 136 mmol/L (ref 135–145)

## 2014-04-09 LAB — CBC
HCT: 41.4 % (ref 39.0–52.0)
HEMOGLOBIN: 13.4 g/dL (ref 13.0–17.0)
MCH: 29.9 pg (ref 26.0–34.0)
MCHC: 32.4 g/dL (ref 30.0–36.0)
MCV: 92.4 fL (ref 78.0–100.0)
Platelets: 347 10*3/uL (ref 150–400)
RBC: 4.48 MIL/uL (ref 4.22–5.81)
RDW: 15.6 % — AB (ref 11.5–15.5)
WBC: 9.5 10*3/uL (ref 4.0–10.5)

## 2014-04-09 LAB — MAGNESIUM: Magnesium: 2.2 mg/dL (ref 1.5–2.5)

## 2014-04-09 LAB — PROTIME-INR
INR: 3.62 — ABNORMAL HIGH (ref 0.00–1.49)
Prothrombin Time: 36.4 seconds — ABNORMAL HIGH (ref 11.6–15.2)

## 2014-04-09 MED ORDER — GUAIFENESIN-CODEINE 100-10 MG/5ML PO SOLN: 5.0000 mL | ORAL | Status: DC | PRN

## 2014-04-09 MED ORDER — DIPHENHYDRAMINE HCL 12.5 MG/5ML PO ELIX: 12.5000 mg | ORAL_SOLUTION | Freq: Four times a day (QID) | ORAL | Status: DC | PRN

## 2014-04-09 MED ORDER — TORSEMIDE 20 MG PO TABS
80.0000 mg | ORAL_TABLET | Freq: Two times a day (BID) | ORAL | Status: DC
Start: 2014-04-09 — End: 2014-05-03

## 2014-04-09 MED ORDER — GUAIFENESIN-CODEINE 100-10 MG/5ML PO SOLN
5.0000 mL | ORAL | Status: DC | PRN
  Administered 2014-04-12 – 2014-04-18 (×4): 5 mL via ORAL
  Filled 2014-04-09 (×4): qty 5

## 2014-04-09 MED ORDER — SPIRONOLACTONE 25 MG PO TABS: 25.0000 mg | ORAL_TABLET | Freq: Two times a day (BID) | ORAL | Status: DC

## 2014-04-09 MED ORDER — RANOLAZINE ER 1000 MG PO TB12: 1000.0000 mg | ORAL_TABLET | Freq: Two times a day (BID) | ORAL | Status: AC

## 2014-04-09 MED ORDER — SENNOSIDES-DOCUSATE SODIUM 8.6-50 MG PO TABS
1.0000 | ORAL_TABLET | Freq: Every evening | ORAL | Status: DC | PRN
  Administered 2014-04-09 – 2014-04-10 (×2): 1 via ORAL
  Filled 2014-04-09 (×2): qty 1

## 2014-04-09 MED ORDER — RANOLAZINE ER 500 MG PO TB12
1000.0000 mg | ORAL_TABLET | Freq: Two times a day (BID) | ORAL | Status: DC
  Administered 2014-04-09 – 2014-04-20 (×22): 1000 mg via ORAL
  Filled 2014-04-09 (×26): qty 2

## 2014-04-09 MED ORDER — AMIODARONE HCL 200 MG PO TABS
200.0000 mg | ORAL_TABLET | Freq: Two times a day (BID) | ORAL | Status: DC
Start: 2014-04-09 — End: 2014-05-08

## 2014-04-09 MED ORDER — GUAIFENESIN-DM 100-10 MG/5ML PO SYRP: 10.0000 mL | ORAL_SOLUTION | ORAL | Status: DC | PRN

## 2014-04-09 MED ORDER — BISACODYL 10 MG RE SUPP
10.0000 mg | Freq: Every day | RECTAL | Status: DC | PRN
  Administered 2014-04-10: 10 mg via RECTAL
  Filled 2014-04-09 (×3): qty 1

## 2014-04-09 MED ORDER — ACETAMINOPHEN 325 MG PO TABS: 325.0000 mg | ORAL_TABLET | ORAL | Status: DC | PRN

## 2014-04-09 MED ORDER — TORSEMIDE 20 MG PO TABS
80.0000 mg | ORAL_TABLET | Freq: Two times a day (BID) | ORAL | Status: DC
  Filled 2014-04-09 (×3): qty 4

## 2014-04-09 MED ORDER — NITROGLYCERIN 0.4 MG SL SUBL: 0.4000 mg | SUBLINGUAL_TABLET | SUBLINGUAL | Status: DC | PRN

## 2014-04-09 MED ORDER — FLEET ENEMA 7-19 GM/118ML RE ENEM: 1.0000 | ENEMA | Freq: Once | RECTAL | Status: AC | PRN

## 2014-04-09 MED ORDER — ONDANSETRON HCL 4 MG/2ML IJ SOLN
4.0000 mg | Freq: Four times a day (QID) | INTRAMUSCULAR | Status: DC | PRN
  Administered 2014-04-14: 4 mg via INTRAVENOUS
  Filled 2014-04-09: qty 2

## 2014-04-09 MED ORDER — DOXYCYCLINE HYCLATE 100 MG PO TABS
100.0000 mg | ORAL_TABLET | Freq: Two times a day (BID) | ORAL | Status: AC
  Administered 2014-04-09 – 2014-04-20 (×22): 100 mg via ORAL
  Filled 2014-04-09 (×23): qty 1

## 2014-04-09 MED ORDER — WARFARIN - PHARMACIST DOSING INPATIENT: Freq: Every day | Status: DC

## 2014-04-09 MED ORDER — MAGNESIUM OXIDE 400 (241.3 MG) MG PO TABS: 400.0000 mg | ORAL_TABLET | Freq: Every day | ORAL | Status: AC

## 2014-04-09 MED ORDER — ACYCLOVIR 400 MG PO TABS
400.0000 mg | ORAL_TABLET | Freq: Every day | ORAL | Status: DC
  Administered 2014-04-10: 400 mg via ORAL
  Filled 2014-04-09 (×4): qty 1

## 2014-04-09 MED ORDER — POTASSIUM CHLORIDE ER 20 MEQ PO TBCR: 60.0000 meq | EXTENDED_RELEASE_TABLET | Freq: Two times a day (BID) | ORAL | Status: DC

## 2014-04-09 MED ORDER — CYCLOBENZAPRINE HCL 10 MG PO TABS
10.0000 mg | ORAL_TABLET | Freq: Two times a day (BID) | ORAL | Status: DC
  Administered 2014-04-09 – 2014-04-20 (×22): 10 mg via ORAL
  Filled 2014-04-09 (×26): qty 1

## 2014-04-09 MED ORDER — SPIRONOLACTONE 25 MG PO TABS
25.0000 mg | ORAL_TABLET | Freq: Two times a day (BID) | ORAL | Status: DC
  Administered 2014-04-09 – 2014-04-20 (×20): 25 mg via ORAL
  Filled 2014-04-09 (×25): qty 1

## 2014-04-09 MED ORDER — ONDANSETRON HCL 4 MG PO TABS: 4.0000 mg | ORAL_TABLET | Freq: Four times a day (QID) | ORAL | Status: DC | PRN

## 2014-04-09 MED ORDER — ALUM & MAG HYDROXIDE-SIMETH 200-200-20 MG/5ML PO SUSP: 30.0000 mL | ORAL | Status: DC | PRN

## 2014-04-09 MED ORDER — SODIUM CHLORIDE 0.9 % IJ SOLN
10.0000 mL | INTRAMUSCULAR | Status: DC | PRN
  Administered 2014-04-09 – 2014-04-20 (×15): 10 mL
  Filled 2014-04-09 (×14): qty 40

## 2014-04-09 MED ORDER — LEVOTHYROXINE SODIUM 125 MCG PO TABS
250.0000 ug | ORAL_TABLET | Freq: Every day | ORAL | Status: DC
Start: 2014-04-10 — End: 2014-04-20
  Administered 2014-04-10 – 2014-04-20 (×11): 250 ug via ORAL
  Filled 2014-04-09 (×12): qty 2

## 2014-04-09 MED ORDER — MILRINONE IN DEXTROSE 20 MG/100ML IV SOLN
0.3750 ug/kg/min | INTRAVENOUS | Status: DC
  Administered 2014-04-09 – 2014-04-12 (×9): 0.375 ug/kg/min via INTRAVENOUS
  Filled 2014-04-09 (×10): qty 100

## 2014-04-09 MED ORDER — OXYCODONE-ACETAMINOPHEN 5-325 MG PO TABS
1.0000 | ORAL_TABLET | Freq: Four times a day (QID) | ORAL | Status: DC | PRN
  Administered 2014-04-10: 1 via ORAL
  Filled 2014-04-09: qty 2
  Filled 2014-04-09: qty 1

## 2014-04-09 MED ORDER — AMIODARONE HCL 200 MG PO TABS
200.0000 mg | ORAL_TABLET | Freq: Two times a day (BID) | ORAL | Status: DC
  Administered 2014-04-09: 200 mg via ORAL
  Filled 2014-04-09 (×2): qty 1

## 2014-04-09 MED ORDER — DOXYCYCLINE HYCLATE 100 MG PO TABS: 100.0000 mg | ORAL_TABLET | Freq: Two times a day (BID) | ORAL | Status: DC

## 2014-04-09 MED ORDER — POTASSIUM CHLORIDE CRYS ER 20 MEQ PO TBCR
60.0000 meq | EXTENDED_RELEASE_TABLET | Freq: Two times a day (BID) | ORAL | Status: DC
  Administered 2014-04-09 – 2014-04-10 (×2): 60 meq via ORAL
  Filled 2014-04-09 (×3): qty 3

## 2014-04-09 MED ORDER — ACETAMINOPHEN 325 MG PO TABS: 650.0000 mg | ORAL_TABLET | ORAL | Status: DC | PRN

## 2014-04-09 MED ORDER — TORSEMIDE 20 MG PO TABS
80.0000 mg | ORAL_TABLET | Freq: Two times a day (BID) | ORAL | Status: DC
  Administered 2014-04-09 – 2014-04-16 (×14): 80 mg via ORAL
  Filled 2014-04-09 (×16): qty 4

## 2014-04-09 MED ORDER — MAGNESIUM OXIDE 400 (241.3 MG) MG PO TABS
400.0000 mg | ORAL_TABLET | Freq: Every day | ORAL | Status: DC
  Administered 2014-04-10 – 2014-04-20 (×11): 400 mg via ORAL
  Filled 2014-04-09 (×13): qty 1

## 2014-04-09 MED ORDER — MILRINONE IN DEXTROSE 20 MG/100ML IV SOLN
0.3750 ug/kg/min | INTRAVENOUS | Status: DC
  Filled 2014-04-09: qty 100

## 2014-04-09 MED ORDER — AMIODARONE HCL 200 MG PO TABS
200.0000 mg | ORAL_TABLET | Freq: Two times a day (BID) | ORAL | Status: DC
  Administered 2014-04-09 – 2014-04-20 (×22): 200 mg via ORAL
  Filled 2014-04-09 (×25): qty 1

## 2014-04-09 MED ORDER — ALPRAZOLAM 0.25 MG PO TABS
0.2500 mg | ORAL_TABLET | Freq: Two times a day (BID) | ORAL | Status: DC | PRN
  Administered 2014-04-16: 0.25 mg via ORAL
  Filled 2014-04-09: qty 1

## 2014-04-09 NOTE — Consult Note (Signed)
ANTICOAGULATION CONSULT NOTE   Pharmacy Consult for Coumadin Indication: Afib / Stroke  Allergies  Allergen Reactions  . Valsartan Cough    Patient Measurements: Height: 5\' 9"  (175.3 cm) Weight: 265 lb 14.4 oz (120.611 kg) IBW/kg (Calculated) : 70.7  Vital Signs: Temp: 97.5 F (36.4 C) (03/22 0740) Temp Source: Oral (03/22 0740) BP: 97/61 mmHg (03/22 0740) Pulse Rate: 70 (03/22 0740)  Labs:  Recent Labs (last 2 labs)      Recent Labs  04/07/14 0418 04/08/14 0335 04/09/14 0508  HGB --  13.3 13.4  HCT --  40.3 41.4  PLT --  332 347  LABPROT 30.2* 32.6* 36.4*  INR 2.86* 3.15* 3.62*  CREATININE 1.78* 1.77* 1.57*      Estimated Creatinine Clearance: 71.4 mL/min (by C-G formula based on Cr of 1.57).  Assessment: Patient is a 54 yo M admitted 03/30/2014 for VT shock - in setting low K, mag - now replaced. Pharmacy consulted to dose warfarin.  He has been on Coumadin per Pharmacy consult while an inpatient.  He is tranferred to Inpatient Rehab Unit and Pharmacy asked to continue to manage Coumadin.  PMH: Afib, NICM, EF 15% ICD with Hx VT  AC: warf for chronic AFib - CVR - INR 1.65 on admit > subtherap> > held 3 days. Restarted on 3/18 and now supratherapeutic and still climbing 3.15>3.62. CBC stable no bleeding  During admit amiodarone dose has been increased 200mg  qd> 400mg  BID - will make pt more sensitive to Coumadin PTA warf 7.5mg  MWF, 11.25mg  TTSS (consider resuming at [facility] lower dose when INR drops into goal)  ID: foot Cellulitis, Afeb, WBC down to wnl  3/17 vanc>> acyclovir  Goal of Therapy:  INR 2-3 Monitor platelets by anticoagulation protocol: Yes  Plan:  Hold coumadin tonight again Monitor daily INR, CBC, s/s of bleed Follow up duration of therapy with Vancomycin for celluliis     Rosa Gambale,  Pharm.D   04/09/2014,  4:42 PM

## 2014-04-09 NOTE — PMR Pre-admission (Signed)
PMR Admission Coordinator Pre-Admission Assessment  Patient: Raymond [name] is an 54 y.o., male MRN: [id number] DOB: 1960/12/03 Height: 5\' 9"  (175.3 cm) Weight: 120.611 kg (265 lb 14.4 oz)              Insurance Information HMO:   PPO:      PCP:      IPA:      80/20:      OTHER:  PRIMARY:  Medicare A and B      Policy#:  [id number]      Subscriber:  self CM Name:       Phone#:      Fax#:  Pre-Cert#:       Employer: disabled Benefits:  Phone #:      Name:  Eff. Date: 10/18/09     Deduct:  $1288      Out of Pocket Max:  none      Life Max:  none CIR:  100%      SNF:  100% first 20 days per Medicare Outpatient:  80%/20%     Co-Pay:  Home Health: 100% per Medicare guidelines      Co-Pay:   DME:  80%/20%     Co-Pay:  Providers:  Pt. choice SECONDARY:       Policy#:       Subscriber:  CM Name:       Phone#:      Fax#:  Pre-Cert#:       Employer:  Benefits:  Phone #:      Name:    Eff. Date:      Deduct:       Out of Pocket Max:       Life Max:  CIR:       SNF:  Outpatient:      Co-Pay:  Home Health:       Co-Pay:  DME:      Co-Pay:   Medicaid Application Date:       Case Manager:  Disability Application Date:       Case Worker:   Emergency Contact Information Contact Information    Name Relation Home Work Mobile   Drew,Shirley Mother [phone]   Stanley,Darlene Significant other [phone]       Current Medical History  Patient Admitting Diagnosis: debility after multiple medical/chf. Right foot cellulitis History of Present Illness: Raymond [name] is a 54 y.o. 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 LOV was 03/27/14 and his Amiodarone was cut back to 200 mg daily and his Torsemide was increased to 40 mg BID and  spironolactone was added. Since admission, he was shocked for VT on the evening of 3/12 and again on the evening of 3/13. He was evaluated by Dr Graciela Husbands and started on ranolazine and has been transitioned back to po amiodarone. He was diuresed with milrinone and lasix for acute on chronic systolic CHF with good results. He has had foot pain due to cellulitis and was started on doxycyline for treatment. Therapy initiated and patient limited by right foot pain with inability to weight bear on RLE as well as left hemiparesis due to prior CVA. MD recommending CIR for follow up therapy.  Pt. Admitted to CIR on 04/09/14.  Past Medical History  Past Medical History  Diagnosis Date  . Non-ischemic cardiomyopathy     a. echo 11/10: EF 15%;   b. cath 10/08: normal cors;  c. Myoview 5/12: Very mild distal ant and apical isch, EF 17% (low risk-medical Rx cont'd);  d.  Echo 4/14:  EF 15%;  e. 05/2009 s/p SJM BiV ICD;  f. 07/2012 TEE: EF 20-25%, diff HK mild MR, mod TR.   Marland Kitchen Atrial fibrillation     a. on coumadin;  b. 07/2012 s/p TEE/DCCV. c. Recurrence 08/2012 in setting of abnormal thyroid panel.; 12/2012 AVN ablation by Dr [name]  . Hypertension   . Obesity   . Non-compliance   . Dyslexia     unable to read.  Marland Kitchen History of CVA (cerebrovascular accident) 11/2008    a. 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 RBBB (right bundle branch block)   . Cough syncope     a. During 08/2012 adm.  . Systolic CHF, chronic 01/19/1996  . Hypothyroidism 01/19/1996    s/p RAI therapy  . Stroke 01/19/2008  . Renal insufficiency     Family History  family history includes Atrial fibrillation in his sister; Diabetes in his father and sister; Heart disease (age of onset: 62) in his father; Heart disease (age of onset: 76) in his mother; Hyperlipidemia in his mother; Hypertension in his mother; Stroke (age of onset: 81) in his father.  Prior Rehab/Hospitalizations: Pt. With prior history of CIR 2010 following a  stroke  Current Medications   Current facility-administered medications:  .  0.9 %  sodium chloride infusion, , Intravenous, Continuous, Cathren Laine, MD, Stopped at 04/01/14 0430 .  0.9 %  sodium chloride infusion, 250 mL, Intravenous, PRN, Abelino Derrick, PA-C, Stopped at 03/31/14 0800 .  acetaminophen (TYLENOL) tablet 650 mg, 650 mg, Oral, Q4H PRN, Abelino Derrick, PA-C, 650 mg at 04/03/14 1311 .  acyclovir (ZOVIRAX) tablet 400 mg, 400 mg, Oral, Daily, [employer], PA-C, 400 mg at 04/09/14 1610 .  ALPRAZolam Prudy Feeler) tablet 0.25 mg, 0.25 mg, Oral, BID PRN, Laurey Morale, MD, 0.25 mg at 04/08/14 2231 .  amiodarone (PACERONE) tablet 200 mg, 200 mg, Oral, BID, Laurey Morale, MD, 200 mg at 04/09/14 9604 .  cyclobenzaprine (FLEXERIL) tablet 10 mg, 10 mg, Oral, BID, Eda Paschal [facility], PA-C, 10 mg at 04/09/14 5409 .  doxycycline (VIBRA-TABS) tablet 100 mg, 100 mg, Oral, Q12H, Laurey Morale, MD, 100 mg at 04/09/14 0926 .  guaiFENesin-codeine 100-10 MG/5ML solution 5 mL, 5 mL, Oral, Q4H PRN, Marinus Maw, MD, 5 mL at 04/04/14 2251 .  guaiFENesin-dextromethorphan (ROBITUSSIN DM) 100-10 MG/5ML syrup 10 mL, 10 mL, Oral, Q4H PRN, Dolores Patty, MD, 10 mL at 03/30/14 2035 .  levothyroxine (SYNTHROID, LEVOTHROID) tablet 250 mcg, 250 mcg, Oral, QAC breakfast, Eda Paschal [place], PA-C, 250 mcg at 04/09/14 8119 .  magnesium oxide (MAG-OX) tablet 400 mg, 400 mg, Oral, Daily, Laurey Morale, MD, 400 mg at 04/09/14 1478 .  milrinone (PRIMACOR) 20 MG/100ML (0.2 mg/mL) infusion, 0.375 mcg/kg/min, Intravenous, Continuous, [employer], PA-C, Last Rate: 14.5 mL/hr at 04/09/14 1100, 0.375 mcg/kg/min at 04/09/14 1100 .  nitroGLYCERIN (NITROSTAT) SL tablet 0.4 mg, 0.4 mg, Sublingual, Q5 Min x 3 PRN, Luke K Kilroy, PA-C .  ondansetron [facility]) injection 4 mg, 4 mg, Intravenous, Q6H PRN, Abelino Derrick, PA-C, 4 mg at 04/09/14 2956 .  oxyCODONE-acetaminophen (PERCOCET/ROXICET) 5-325 MG per tablet 1-2  tablet, 1-2 tablet,  Oral, Q6H PRN, Laurey Morale, MD, 2 tablet at 04/07/14 1356 .  potassium chloride SA (K-DUR,KLOR-CON) CR tablet 60 mEq, 60 mEq, Oral, BID, Laurey Morale, MD, 60 mEq at 04/09/14 0926 .  ranolazine (RANEXA) 12 hr tablet 1,000 mg, 1,000 mg, Oral, BID, Duke Salvia, MD, 1,000 mg at 04/09/14 1610 .  sodium chloride 0.9 % injection 10-40 mL, 10-40 mL, Intracatheter, Q12H, Dolores Patty, MD, 10 mL at 04/08/14 2232 .  sodium chloride 0.9 % injection 10-40 mL, 10-40 mL, Intracatheter, PRN, Dolores Patty, MD, 10 mL at 04/09/14 0935 .  sodium chloride 0.9 % injection 3 mL, 3 mL, Intravenous, Q12H, [employer], PA-C, 3 mL at 04/08/14 2232 .  sodium chloride 0.9 % injection 3 mL, 3 mL, Intravenous, PRN, Abelino Derrick, PA-C .  spironolactone (ALDACTONE) tablet 25 mg, 25 mg, Oral, BID, Laurey Morale, MD, 25 mg at 04/08/14 1636 .  torsemide (DEMADEX) tablet 80 mg, 80 mg, Oral, BID, Laurey Morale, MD, 80 mg at 04/09/14 0934 .  Warfarin - Pharmacist Dosing Inpatient, , Does not apply, q1800, Rosaland Lao, RPH, Stopped at 04/07/14 1800  Patients Current Diet: Diet Heart, fluid restriction 1500 ml, 2 gm Na+  Precautions / Restrictions Precautions Precautions: Fall Restrictions Weight Bearing Restrictions: No   Prior Activity Level Household: Per pt., he typically goes out of the house only for MD appointments.  He lives in [place] and sometimes will drive to [place] where his mother will take him to his appointments.  He uses the single point cane in the home. Home Assistive Devices / Equipment Home Assistive Devices/Equipment: Medical laboratory scientific officer (specify quad or straight) Home Equipment: Cane - quad, Cane - single point, Grab bars - tub/shower  Prior Functional Level Prior Function Level of Independence: Independent with assistive device(s) Comments: Amb with straight cane  Current Functional Level Cognition  Overall Cognitive Status: Within Functional Limits for tasks  assessed Orientation Level: Oriented X4    Extremity Assessment (includes Sensation/Coordination)  Upper Extremity Assessment: LUE deficits/detail LUE Deficits / Details: very limited use due to significant weakness after stroke  Lower Extremity Assessment: LLE deficits/detail LLE Deficits / Details: grossly 4/5 after stroke    ADLs       Mobility  Overal bed mobility: Needs Assistance Bed Mobility: Supine to Sit Supine to sit: Min assist, HOB elevated Sit to supine: Min assist, HOB elevated General bed mobility comments: Assist to raise trunk to sitting.    Transfers  Overall transfer level: Needs assistance Equipment used: Left platform walker Transfers: Sit to/from Stand Sit to Stand: Min assist, +2 physical assistance Stand pivot transfers: Mod assist, +2 physical assistance General transfer comment: Assist to bring hips up and for balance.    Ambulation / Gait / Stairs / Wheelchair Mobility  Ambulation/Gait Ambulation/Gait assistance: Min assist, +2 physical assistance Ambulation Distance (Feet): 15 Feet Assistive device: Left platform walker Gait Pattern/deviations: Step-to pattern, Decreased step length - left, Decreased step length - right, Decreased stance time - right, Trunk flexed Gait velocity interpretation: Below normal speed for age/gender General Gait Details: Verbal cues to stand more erect and incr step length on lt. Assist for balance and to manuever walker.     Posture / Balance Balance Overall balance assessment: Needs assistance Sitting-balance support: No upper extremity supported Sitting balance-Leahy Scale: Fair Standing balance support: Bilateral upper extremity supported Standing balance-Leahy Scale: Poor    Special needs/care consideration BiPAP/CPAP   no CPM  no Continuous Drip IV  Yes, milrinone drip currently and prior to admission via PICC line right arm Dialysis     no    Days Life Vest  no Oxygen no Special Bed  no Trach Size   no Wound Vac (area)  no       Skin  Per pt., no soreness on buttocks, no issues noted                              Bowel mgmt:   Last BM 04/05/17 per pt. And RN; I have requested that RN address bowel stimulation with MD.  She plans to request a laxative. Bladder mgmt:   Pt. Reports continence but with difficulty using urinal consistently due to weakness in left hand/arm.  He currently has a condom cath in place as of 04/09/14 am Diabetic mgmt   no     Previous Home Environment Living Arrangements: Alone Available Help at Discharge: Family, Available PRN/intermittently Type of Home: House Home Layout: Two level, Able to live on main level with bedroom/bathroom Home Access: Stairs to enter Entrance Stairs-Number of Steps: 1 Home Care Services: Yes Type of Home Care Services: Homehealth aide, Home RN Home Care Agency (if known): [facility]  Discharge Living Setting Plans for Discharge Living Setting: Patient's home Type of Home at Discharge: House Discharge Home Layout: Two level, Able to live on main level with bedroom/bathroom, Full bath on main level Discharge Home Access: Stairs to enter Entrance Stairs-Rails: None Entrance Stairs-Number of Steps: 1 Discharge Bathroom Shower/Tub: Tub/shower unit Discharge Bathroom Toilet: Standard Discharge Bathroom Accessibility: Yes How Accessible: Accessible via walker Does the patient have any problems obtaining your medications?: No  Social/Family/Support Systems Patient Roles: Partner Anticipated Caregiver: Pt.'s mom does not assist pt. except for driving him to MD appointments.  Girlfriend Sherral Hammers works Monday thru Friday and comes to be with pt. on the weekend.   Ability/Limitations of Caregiver: Per pt's mom , Ralene Ok, she cannot physically assist due to her own heart failure Caregiver Availability: Intermittent Discharge Plan Discussed with Primary Caregiver: Yes Is Caregiver In Agreement with Plan?: Yes Does  Caregiver/Family have Issues with Lodging/Transportation while Pt is in Rehab?: No    Goals/Additional Needs Patient/Family Goal for Rehab: mod I with PT and OT, n/a SLP Expected length of stay: 7 days Cultural Considerations: no Equipment Needs: TBA; has been using PFRW on acute this hospitalization Special Service Needs: Pt. is dyslexic and only recognizes simple and common words Pt/Family Agrees to Admission and willing to participate: Yes Program Orientation Provided & Reviewed with Pt/Caregiver Including Roles  & Responsibilities: Yes Additional Information Needs: By staff report, pt. has come across as harsh and demanding toward staff.  I had a lenghty discussion with pt. to encourage him to be sensitive to expressing his needs in a positive manner. He voices that he will do so.   Decrease burden of Care through IP rehab admission: no   Possible need for SNF placement upon discharge:  Not anticipated and pt. Refuses SNF   Patient Condition: This patient's condition remains as documented in the consult dated 04/08/14, in which the Rehabilitation Physician determined and documented that the patient's condition is appropriate for intensive rehabilitative care in an inpatient rehabilitation facility. Will admit to inpatient rehab today.  Preadmission Screen Completed By:  Weldon Picking, 04/09/2014 11:47 AM ______________________________________________________________________   Discussed status with Dr.  Riley Kill on 04/09/14 at  1155  and received telephone approval for admission today.  Admission Coordinator:  Weldon Picking, time 1610 /Date 04/09/14

## 2014-04-09 NOTE — Progress Notes (Signed)
ANTICOAGULATION CONSULT NOTE - Follow Up Consult  Pharmacy Consult for Coumadin Indication: Afib / Stroke  Allergies  Allergen Reactions  . Valsartan Cough    Patient Measurements: Height: 5\' 9"  (175.3 cm) Weight: 265 lb 14.4 oz (120.611 kg) IBW/kg (Calculated) : 70.7  Vital Signs: Temp: 97.5 F (36.4 C) (03/22 0740) Temp Source: Oral (03/22 0740) BP: 97/61 mmHg (03/22 0740) Pulse Rate: 70 (03/22 0740)  Labs:  Recent Labs  04/07/14 0418 04/08/14 0335 04/09/14 0508  HGB  --  13.3 13.4  HCT  --  40.3 41.4  PLT  --  332 347  LABPROT 30.2* 32.6* 36.4*  INR 2.86* 3.15* 3.62*  CREATININE 1.78* 1.77* 1.57*    Estimated Creatinine Clearance: 71.4 mL/min (by C-G formula based on Cr of 1.57).  Assessment: 54 yo M admitted 03/30/2014 for VT shock - in setting low K, mag - now replaced. Pharmacy consulted to dose warfarin.  PMH: Afib, NICM, EF 15% ICD with Hx VT  AC: warf for chronic AFib - CVR - INR 1.65 on admit > subtherap> > held 3 days. Restarted on 3/18 and now supratherapeutic and still climbing 3.15>3.62. CBC stable no bleeding  During admit amiodarone dose has been increased 200mg  qd> 400mg  BID - will make pt more sensitive to Coumadin PTA warf 7.5mg  MWF, 11.25mg  TTSS  (consider resuming at [facility] lower dose when INR drops into goal)  ID: foot Cellulitis, Afeb, WBC down to wnl  3/17 vanc>> acyclovir   Goal of Therapy:  INR 2-3 Monitor platelets by anticoagulation protocol: Yes   Plan:  Hold coumadin tonight again Monitor daily INR, CBC, s/s of bleed Follow up duration of therapy with Vancomycin for celluliis  Leota Sauers Pharm.D. CPP, BCPS Clinical Pharmacist [phone] 04/09/2014 7:56 AM

## 2014-04-09 NOTE — Progress Notes (Signed)
Patient ID: Raymond [name], male   DOB: Jun 26, 1960, 54 y.o.   MRN: [id number]   54 yo with history of nonischemic cardiomyopathy, EF 10-15% by last echo and St Jude CRT-D device, chronic atrial fibrillation s/p AV nodal ablation, h/o VT, CKD, CVA presented with VT/VF and syncope.  At last office visit, torsemide was increased and amiodarone was decreased to 200 daily.  He was hypokalemic at admission.    Since admission, he was shocked for VT on the evening of 3/12 and again on the evening of 3/13.  Seen by Dr Graciela Husbands and started on ranolazine.  Remains on milrinone at 0.375. Creatinine stable.  Diuretics to po on 3/19.  CVP 8 today again, good diuresis yesterday.  Main complaint is that torsemide is being given too late in the day and he is up at night urinating.  No VT/NSVT.  Overall doing better, able to walk but unstable.  Scheduled Meds: . acyclovir  400 mg Oral Daily  . amiodarone  200 mg Oral BID  . cyclobenzaprine  10 mg Oral BID  . doxycycline  100 mg Oral Q12H  . levothyroxine  250 mcg Oral QAC breakfast  . magnesium oxide  400 mg Oral Daily  . potassium chloride  60 mEq Oral BID  . ranolazine  1,000 mg Oral BID  . sodium chloride  10-40 mL Intracatheter Q12H  . sodium chloride  3 mL Intravenous Q12H  . spironolactone  25 mg Oral BID  . torsemide  80 mg Oral BID  . Warfarin - Pharmacist Dosing Inpatient   Does not apply q1800   Continuous Infusions: . sodium chloride Stopped (04/01/14 0430)  . milrinone 0.375 mcg/kg/min (04/09/14 0248)   PRN Meds:.sodium chloride, acetaminophen, ALPRAZolam, guaiFENesin-codeine, guaiFENesin-dextromethorphan, nitroGLYCERIN, ondansetron (ZOFRAN) IV, oxyCODONE-acetaminophen, sodium chloride, sodium chloride   Filed Vitals:   04/08/14 1900 04/08/14 2300 04/09/14 0430 04/09/14 0740  BP: 125/89 113/87 113/66 97/61  Pulse:    70  Temp: 97.8 F (36.6 C) 97.4 F (36.3 C) 96.8 F (36 C) 97.5 F (36.4 C)  TempSrc: Oral Oral Axillary Oral  Resp: Height:      Weight:   265 lb 14.4 oz (120.611 kg)   SpO2: 95% 96% 98% 97%    Intake/Output Summary (Last 24 hours) at 04/09/14 0755 Last data filed at 04/09/14 0700  Gross per 24 hour  Intake   1028 ml  Output   3025 ml  Net  -1997 ml    LABS: Basic Metabolic Panel:  Recent Labs  40/98/11 0335 04/09/14 0508  NA 137 136  K 4.1 4.3  CL 94* 98  CO2 35* 32  GLUCOSE 119* 194*  BUN 29* 24*  CREATININE 1.77* 1.57*  CALCIUM 9.3 9.5   Liver Function Tests: No results for input(s): AST, ALT, ALKPHOS, BILITOT, PROT, ALBUMIN in the last 72 hours. No results for input(s): LIPASE, AMYLASE in the last 72 hours. CBC:  Recent Labs  04/08/14 0335 04/09/14 0508  WBC 7.7 9.5  HGB 13.3 13.4  HCT 40.3 41.4  MCV 92.0 92.4  PLT 332 347   Cardiac Enzymes: No results for input(s): CKTOTAL, CKMB, CKMBINDEX, TROPONINI in the last 72 hours. BNP: Invalid input(s): POCBNP D-Dimer: No results for input(s): DDIMER in the last 72 hours. Hemoglobin A1C: No results for input(s): HGBA1C in the last 72 hours. Fasting Lipid Panel: No results for input(s): CHOL, HDL, LDLCALC, TRIG, CHOLHDL, LDLDIRECT in the last 72 hours. Thyroid Function  Tests: No results for input(s): TSH, T4TOTAL, T3FREE, THYROIDAB in the last 72 hours.  Invalid input(s): FREET3 Anemia Panel: No results for input(s): VITAMINB12, FOLATE, FERRITIN, TIBC, IRON, RETICCTPCT in the last 72 hours.  RADIOLOGY: Dg Chest 2 View  03/28/2014   CLINICAL DATA:  Shortness of breath since last night.  EXAM: CHEST  2 VIEW  COMPARISON:  02/18/2014  FINDINGS: Pacer/AICD device.  A right internal jugular line terminates over the internal jugular vein. Midline trachea. Moderate cardiomegaly. No pleural effusion or pneumothorax. Mild interstitial edema. No lobar consolidation.  IMPRESSION: Mild congestive heart failure.  Right internal jugular line with tip projecting over the low right neck. Presuming low SVC position is desired, this  should be advanced approximately 11 cm.   Electronically Signed   By: Jeronimo Greaves M.D.   On: 03/28/2014 20:47   Dg Chest Port 1 View  04/02/2014   CLINICAL DATA:  Central line placement  EXAM: PORTABLE CHEST - 1 VIEW  COMPARISON:  03/30/2014  FINDINGS: Marked cardiac enlargement. Pulmonary vascular congestion and mild interstitial edema is unchanged. No significant effusion.  Right arm PICC tip enters the SVC with the tip not well visualized due to pacemaker leads.  Right jugular catheter tip overlies the neck at the cervical thoracic junction possibly and external jugular branch. This is unchanged.  IMPRESSION: Congestive heart failure with mild edema  Right arm PICC tip enters the SVC with the tip not seen due to overlying pacemaker leads  Right external jugular catheter tip remains in the lower neck unchanged from the prior study.   Electronically Signed   By: Marlan Palau M.D.   On: 04/02/2014 11:50   Dg Chest Port 1 View  03/30/2014   CLINICAL DATA:  54 year old male with a history of syncope. Report of AICD discharge  EXAM: PORTABLE CHEST - 1 VIEW  COMPARISON:  03/28/2014, fluoroscopy 02/21/2014  FINDINGS: Cardiomediastinal silhouette is similar to prior with cardiomegaly.  Central vascular congestion with right hilar opacities.  No pneumothorax.  No large pleural effusion. Retrocardiac region not well evaluated given the EKG leads, defibrillator pads, and overlying soft tissues of the chest wall.  Unchanged position of AICD generator on the left chest wall with 3 leads in place.  Right IJ tunneled central catheter has been withdrawn to the cervical internal jugular vein since the placement 02/21/2014.  IMPRESSION: Evidence of pulmonary vascular congestion/developing edema.  Cardiomegaly.  Interval withdrawal of the right IJ central venous catheter into the cervical internal jugular vein.  Overlying defibrillator pads.  Signed,  Yvone Neu. Loreta Ave, DO  [facility]   [facility]   Electronically Signed   By: Gilmer Mor D.O.   On: 03/30/2014 11:33   Dg Foot Complete Right  04/03/2014   CLINICAL DATA:  Syncope and fall, injuring right foot 3 days ago  EXAM: RIGHT FOOT COMPLETE - 3+ VIEW  COMPARISON:  None.  FINDINGS: There is no evidence of fracture or dislocation. There is no evidence of arthropathy or other focal bone abnormality. Soft tissues are unremarkable.  IMPRESSION: Negative.   Electronically Signed   By: Ellery Plunk M.D.   On: 04/03/2014 00:51    PHYSICAL EXAM CVP 8 General: NAD Neck: JVP 8 cm, no thyromegaly or thyroid nodule.  Lungs: Clear to auscultation bilaterally with normal respiratory effort. CV: Nondisplaced PMI.  Heart irregular S1/S2 (frequent PVCs), no S3/S4, no murmur.  No peripheral edema.  No carotid bruit.  Normal pedal pulses.  Abdomen:  Soft, nontender, no hepatosplenomegaly, no distention.  Neurologic: Alert and oriented x 3.  Psych: Normal affect. Extremities: No clubbing or cyanosis.  R foot erythematous (improving) and less tender to touch  TELEMETRY: Reviewed telemetry pt underlying atrial fibrillation with v-pacing (s/p AVN ablation)  ASSESSMENT AND PLAN: 54 yo with history of nonischemic cardiomyopathy, EF 10-15% by last echo and St Jude CRT-D device, chronic atrial fibrillation s/p AV nodal ablation, h/o VT, CKD, CVA presented with VT/VF and syncope.  1. VT: Multiple episodes recently.  At last office visit, amiodarone was decreased and torsemide was increased.  He has been hypokalemic but now repleted.  No further NSVT.  K ok today.    - Continue po amiodarone, will decrease to 200 mg bid for home dose.  - Continue ranolazine 1000 bid.  - Continue spironolactone 25 mg bid..  - Continue KDur 60 bid.    2. Acute on chronic systolic CHF: EF 49-17% on 1/16 echo, nonischemic cardiomyopathy.  St Jude CRT-D device.  Not good candidate for advanced therapies, has no caregiver at home. On home milrinone at  0.375.  Co-ox lower today.   - Continue torsemide 80 mg bid.    - Continue home milrinone at 0.375. Co-ox worse today than it has been. Do not want to increase milrinone due to NSVT/VT, repeat co-ox later this morning.  3. Atrial fibrillation: Chronic, s/p AVN ablation.  Continue warfarin.  4. CKD: Creatinine stable at his baseline.  5. Foot pain: No fracture on imaging.  Now with probable cellulitis, on doxycycline to complete 14 day course.  6. Hyponatremia: With volume overload.  Stressed need to limit water and ice intake. Sodium ok today.  7. Disposition: Poorly mobile.  He has been screened by inpatient rehab, ok for him to go there today.  Followup 1 week after discharge in CHF clinic, also needs followup coumadin clinic.  Discharge meds: Milrinone 0.375 mcg/kg/min, amiodarone 200 mg bid, ranolazine 1000 mg bid, spironolactone 25 mg bid, Kdur 60 bid, magnesium oxide 400 daily, torsemide 80 bid, doxycycline 100 bid to complete 14 days, warfarin per coumadin clinic, noncardiac meds as prior to admission.   Marca Ancona MD 04/09/2014 7:55 AM

## 2014-04-09 NOTE — Progress Notes (Addendum)
Inpatient Rehabilitation  I met with [name] and his mom at the bedside to discuss the feasibility of an IP Rehab admission based on Dr. Naaman Plummer' recommendations from IP  Rehab consult.  Pt. Has been difficult to  motivate per therapists but has pledged that he will participate to the best of his ability in our IP Rehab program which requires 3 hours of spaced out participation in therapies daily.    I have discussed with Elissa Hefty, CM, pt's RN Marcie Bal.  I have placed a call to Dr. Aundra Dubin for medical clearance for admission today. I anticipate his agreement and will proceed with arranging admission once I hear back from him.  Please call if questions.  [facility] Admissions Coordinator Cell [phone] Office [phone]  1123:  I received a call from Dr. Aundra Dubin clearing pt. For admission to rehab today.  I will make arrangements.   Gerlean Ren

## 2014-04-09 NOTE — H&P (Signed)
Physical Medicine and Rehabilitation Admission H&P   Chief Complaint  Patient presents with  . Debility due to multiple medical issues    HPI: Raymond [name] is a 54 y.o. 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 CXR with evidence of fluid overload, K+ at low normal levels and patient reported weight up by 4 lbs. Past admission, he was shocked for VT on the evening of 3/12 and again on the evening of 3/13. He was started on IV amiodarone and magnesium/potassium supplemented.  He was evaluated by Dr Caryl Comes and started on ranolazine and has been transitioned back to po amiodarone. He was diuresed with milrinone and lasix for acute on chronic systolic CHF with good results. Co-ox levels elevated today and recheck pending. He has had foot pain due to cellulitis and was started on doxycyline for treatment. Therapy initiated and patient noted to be significantly deconditioned and has been limited by right foot pain as well as left hemiparesis due to prior CVA. Rehab team /MD recommended CIR for follow up therapy and patient admitted today. .    Review of Systems  HENT: Negative for hearing loss.  Respiratory: Negative for cough and shortness of breath.  Cardiovascular: Negative for chest pain and palpitations.  Gastrointestinal: Positive for constipation. Negative for nausea and vomiting.  Genitourinary: Positive for urgency and frequency.  Musculoskeletal: Negative for back pain and neck pain.  Neurological: Positive for sensory change, focal weakness and weakness. Negative for dizziness, tingling and headaches.  Psychiatric/Behavioral: The patient is nervous/anxious and has insomnia.      Past Medical History  Diagnosis Date  . Non-ischemic  cardiomyopathy     a. echo 11/10: EF 15%; b. cath 10/08: normal cors; c. Myoview 5/12: Very mild distal ant and apical isch, EF 17% (low risk-medical Rx cont'd); d. Echo 4/14: EF 15%; e. 05/2009 s/p SJM BiV ICD; f. 07/2012 TEE: EF 20-25%, diff HK mild MR, mod TR.   Marland Kitchen Atrial fibrillation     a. on coumadin; b. 07/2012 s/p TEE/DCCV. c. Recurrence 08/2012 in setting of abnormal thyroid panel.; 12/2012 AVN ablation by Dr Lovena Le  . Hypertension   . Obesity   . Non-compliance   . Dyslexia     unable to read.  Marland Kitchen History of CVA (cerebrovascular accident) 11/2008    a. 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 RBBB (right bundle branch block)   . Cough syncope     a. During 08/2012 adm.  . Systolic CHF, chronic 02/24/3783  . Hypothyroidism 01/19/1996    s/p RAI therapy  . Stroke 01/19/2008  . Renal insufficiency     Past Surgical History  Procedure Laterality Date  . Bi-ventricular implantable cardioverter defibrillator (crt-d)  06/11/09    [place] UNIFY [id number] BIVENTRICULAR AICD SERIAL #[id number]  . Knee arthroscopy Left ~ 1995  . Cardiac catheterization      "several time" (09/01/2012)  . Tee without cardioversion N/A 06/23/2012    Procedure: TRANSESOPHAGEAL ECHOCARDIOGRAM (TEE); Surgeon: Thayer Headings, MD; Location: [facility] ENDOSCOPY; Service: Cardiovascular; Laterality: N/A; TEE will be done at 0730   . Cardioversion N/A 07/27/2012    Procedure: CARDIOVERSION; Surgeon: Thayer Headings, MD; Location: [place]; Service: Cardiovascular; Laterality: N/A;  .  Tee without cardioversion N/A 08/10/2012    Procedure: TRANSESOPHAGEAL ECHOCARDIOGRAM (TEE); Surgeon: Josue Hector, MD; Location: [facility] ENDOSCOPY; Service: Cardiovascular; Laterality: N/A;  . Cardioversion N/A 08/10/2012    Procedure: CARDIOVERSION; Surgeon: Josue Hector, MD; Location: [facility] ENDOSCOPY;  Service: Cardiovascular; Laterality: N/A;  . Sp pta add intra cran  11/2008    a. 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 Ablation  01/09/2013    AVN ablation by Dr Lovena Le  . Av node ablation N/A 01/09/2013    Procedure: AV NODE ABLATION; Surgeon: Evans Lance, MD; Location: [facility] CATH LAB; Service: Cardiovascular; Laterality: N/A;  . Right heart catheterization N/A 02/15/2014    Procedure: RIGHT HEART CATH; Surgeon: Jolaine Artist, MD; Location: [facility] CATH LAB; Service: Cardiovascular; Laterality: N/A;    Family History  Problem Relation Age of Onset  . Heart disease Father 86    AMI  . Diabetes Father   . Stroke Father 55    CVA x 2  . Diabetes Sister   . Atrial fibrillation Sister   . Hyperlipidemia Mother   . Hypertension Mother   . Heart disease Mother 59    CHF    Social History: Lives alone and independent with cane PTA. Girlfriend assists with home management and meals on the weekends. Mother lives out of town. He reports that he has never smoked. He has never used smokeless tobacco. He reports that he drinks alcohol. He reports that he does not use illicit drugs.     Allergies  Allergen Reactions  . Valsartan Cough   Medications Prior to Admission  Medication Sig Dispense Refill  . acyclovir (ZOVIRAX) 800 MG tablet Take 400 mg by mouth daily.     Marland Kitchen ALPRAZolam (XANAX) 0.25 MG tablet Take 1 tablet (0.25 mg total) by mouth at bedtime as needed for anxiety. 30 tablet 2  . cyclobenzaprine (FLEXERIL) 10 MG tablet Take 1 tablet (10 mg total) by mouth 2 (two) times daily. 20 tablet 0  . DM-Doxylamine-Acetaminophen (NYQUIL COLD & FLU PO) Take 30 mLs by mouth daily as needed (for cold).    Marland Kitchen levothyroxine (SYNTHROID, LEVOTHROID) 125 MCG tablet Take 2 tablets (250 mcg total) by mouth daily before breakfast. Additional refill should  come from PCP 60 tablet 3  . Menthol (RICOLA) LOZG Use as directed 1 lozenge in the mouth or throat daily as needed (for cough).    . milrinone (PRIMACOR) 20 MG/100ML SOLN infusion Inject 48.45 mcg/min into the vein continuous. 100 mL   . warfarin (COUMADIN) 7.5 MG tablet Take 1 tablet daily (Patient taking differently: Take 7.5-11.25 mg by mouth daily at 6 PM. Takes 7.5m on Mon, Wed and Fri Takes 11.262mall other days) 40 tablet 3  . ZAROXOLYN 2.5 MG tablet Take 1 tablet (2.5 mg total) by mouth daily. 1 tablet 0  . [DISCONTINUED] amiodarone (PACERONE) 200 MG tablet Take 1 tablet (200 mg total) by mouth daily. 60 tablet 6  . [DISCONTINUED] potassium chloride (K-DUR) 10 MEQ tablet Take 4 tablets (40 mEq total) by mouth daily. 40 tablet 0  . [DISCONTINUED] spironolactone (ALDACTONE) 25 MG tablet Take 1 tablet (25 mg total) by mouth daily. 90 tablet 3  . [DISCONTINUED] torsemide (DEMADEX) 20 MG tablet Take 3 tablets (60 mg total) by mouth 2 (two) times daily. 60 tablet 3  . HYDROcodone-acetaminophen (NORCO/VICODIN) 5-325 MG per tablet TAKE 1 TABLET BY MOUTH EVERY 8 HOURS AS NEEDED (Patient not taking: Reported on 03/30/2014) 15 tablet  0    Home: Home Living Family/patient expects to be discharged to:: Private residence Living Arrangements: Alone Available Help at Discharge: Family, Available PRN/intermittently Type of Home: House Home Access: Stairs to enter Technical brewer of Steps: 1 Home Layout: Two level, Able to live on main level with bedroom/bathroom Home Equipment: Cane - quad, Cane - single point, Grab bars - tub/shower  Functional History: Prior Function Level of Independence: Independent with assistive device(s) Comments: Amb with straight cane  Functional Status:  Mobility: Bed Mobility Overal bed mobility: Needs Assistance Bed Mobility: Supine to Sit Supine to sit: Min assist, HOB elevated Sit to supine: Min  assist, HOB elevated General bed mobility comments: Assist to raise trunk to sitting. Transfers Overall transfer level: Needs assistance Equipment used: Left platform walker Transfers: Sit to/from Stand Sit to Stand: Min assist, +2 physical assistance Stand pivot transfers: Mod assist, +2 physical assistance General transfer comment: Assist to bring hips up and for balance. Ambulation/Gait Ambulation/Gait assistance: Min assist, +2 physical assistance Ambulation Distance (Feet): 15 Feet Assistive device: Left platform walker Gait Pattern/deviations: Step-to pattern, Decreased step length - left, Decreased step length - right, Decreased stance time - right, Trunk flexed Gait velocity interpretation: Below normal speed for age/gender General Gait Details: Verbal cues to stand more erect and incr step length on lt. Assist for balance and to manuever walker.     ADL:    Cognition: Cognition Overall Cognitive Status: Within Functional Limits for tasks assessed Orientation Level: Oriented X4 Cognition Arousal/Alertness: Awake/alert Behavior During Therapy: (pt easily irrated ) Overall Cognitive Status: Within Functional Limits for tasks assessed    Blood pressure 121/73, pulse 70, temperature 97.4 F (36.3 C), temperature source Oral, resp. rate 27, height 5' 9"  (1.753 m), weight 120.611 kg (265 lb 14.4 oz), SpO2 95 %. Physical Exam Nursing note and vitals reviewed. Constitutional: He is oriented to person, place, and time. He appears well-developed and well-nourished.  HENT: dentition fair. Mucosa moist Head: Normocephalic and atraumatic.  Eyes: Conjunctivae and EOM are normal. Pupils are equal, round, and reactive to light.  Neck: No tracheal deviation present. No thyromegaly present.  Cardiovascular: Normal rate and regular rhythm. Exam reveals no friction rub.  No murmur heard. Respiratory: Effort normal and breath sounds normal. No respiratory distress. He has no  wheezes.  GI: Soft. Bowel sounds are normal. He exhibits no distension. There is no tenderness.  Musculoskeletal: He exhibits no edema or tenderness.  Right foot with erythema at heel and laterally. No pain with ROM. mild pain with palpation over the mid foot. Neurological: He is alert and oriented to person, place, and time.  Slow measured speech. He was able to follow basic commands without difficulty. Left hemiparesis UE> LE with sensory deficits. LUE 2+ to 3-/5. LLE: 3/5 HF, 4-KE and 2+ aDF, 3+apf, RUE and RLE 4 to 4+/5, some limitations with movement of right foot. Mild left facial droop. Speech clear.  Skin: Skin is warm and dry.  Psychiatric: His mood appears anxious. His affect is blunt. His speech is delayed. He is slowed.     Lab Results Last 48 Hours    Results for orders placed or performed during the hospital encounter of 03/30/14 (from the past 48 hour(s))  Protime-INR Status: Abnormal   Collection Time: 04/08/14 3:35 AM  Result Value Ref Range   Prothrombin Time 32.6 (H) 11.6 - 15.2 seconds   INR 3.15 (H) 0.00 - 1.49  CBC Status: None   Collection Time: 04/08/14 3:35 AM  Result Value Ref Range   WBC 7.7 4.0 - 10.5 K/uL   RBC 4.38 4.22 - 5.81 MIL/uL   Hemoglobin 13.3 13.0 - 17.0 g/dL   HCT 40.3 39.0 - 52.0 %   MCV 92.0 78.0 - 100.0 fL   MCH 30.4 26.0 - 34.0 pg   MCHC 33.0 30.0 - 36.0 g/dL   RDW 15.4 11.5 - 15.5 %   Platelets 332 150 - 400 K/uL  Basic metabolic panel Status: Abnormal   Collection Time: 04/08/14 3:35 AM  Result Value Ref Range   Sodium 137 135 - 145 mmol/L   Potassium 4.1 3.5 - 5.1 mmol/L   Chloride 94 (L) 96 - 112 mmol/L   CO2 35 (H) 19 - 32 mmol/L   Glucose, Bld 119 (H) 70 - 99 mg/dL   BUN 29 (H) 6 - 23 mg/dL   Creatinine, Ser 1.77 (H) 0.50 - 1.35 mg/dL   Calcium 9.3 8.4 - 10.5 mg/dL   GFR calc non Af Amer 43 (L) >90 mL/min    GFR calc Af Amer 50 (L) >90 mL/min    Comment: (NOTE) The eGFR has been calculated using the CKD EPI equation. This calculation has not been validated in all clinical situations. eGFR's persistently <90 mL/min signify possible Chronic Kidney Disease.    Anion gap 8 5 - 15  Carboxyhemoglobin Status: None   Collection Time: 04/08/14 3:37 AM  Result Value Ref Range   Total hemoglobin 13.7 13.5 - 18.0 g/dL   O2 Saturation 56.0 %   Carboxyhemoglobin 1.0 0.5 - 1.5 %   Methemoglobin 0.9 0.0 - 1.5 %  Glucose, capillary Status: Abnormal   Collection Time: 04/08/14 4:52 PM  Result Value Ref Range   Glucose-Capillary 123 (H) 70 - 99 mg/dL  Carboxyhemoglobin Status: None   Collection Time: 04/09/14 5:00 AM  Result Value Ref Range   Total hemoglobin 13.6 13.5 - 18.0 g/dL   O2 Saturation 45.2 %   Carboxyhemoglobin 1.1 0.5 - 1.5 %   Methemoglobin 0.7 0.0 - 1.5 %  Protime-INR Status: Abnormal   Collection Time: 04/09/14 5:08 AM  Result Value Ref Range   Prothrombin Time 36.4 (H) 11.6 - 15.2 seconds   INR 3.62 (H) 0.00 - 7.85  Basic metabolic panel Status: Abnormal   Collection Time: 04/09/14 5:08 AM  Result Value Ref Range   Sodium 136 135 - 145 mmol/L   Potassium 4.3 3.5 - 5.1 mmol/L   Chloride 98 96 - 112 mmol/L   CO2 32 19 - 32 mmol/L   Glucose, Bld 194 (H) 70 - 99 mg/dL   BUN 24 (H) 6 - 23 mg/dL   Creatinine, Ser 1.57 (H) 0.50 - 1.35 mg/dL   Calcium 9.5 8.4 - 10.5 mg/dL   GFR calc non Af Amer 49 (L) >90 mL/min   GFR calc Af Amer 57 (L) >90 mL/min    Comment: (NOTE) The eGFR has been calculated using the CKD EPI equation. This calculation has not been validated in all clinical situations. eGFR's persistently <90 mL/min signify possible Chronic Kidney Disease.    Anion gap 6 5 - 15  CBC Status: Abnormal   Collection  Time: 04/09/14 5:08 AM  Result Value Ref Range   WBC 9.5 4.0 - 10.5 K/uL   RBC 4.48 4.22 - 5.81 MIL/uL   Hemoglobin 13.4 13.0 - 17.0 g/dL   HCT 41.4 39.0 - 52.0 %   MCV 92.4 78.0 - 100.0 fL   MCH 29.9 26.0 - 34.0 pg  MCHC 32.4 30.0 - 36.0 g/dL   RDW 15.6 (H) 11.5 - 15.5 %   Platelets 347 150 - 400 K/uL  Carboxyhemoglobin Status: None   Collection Time: 04/09/14 12:35 PM  Result Value Ref Range   Total hemoglobin 13.9 13.5 - 18.0 g/dL   O2 Saturation 44.7 %   Carboxyhemoglobin 1.3 0.5 - 1.5 %   Methemoglobin 0.8 0.0 - 1.5 %  Magnesium Status: None   Collection Time: 04/09/14 1:04 PM  Result Value Ref Range   Magnesium 2.2 1.5 - 2.5 mg/dL      Imaging Results (Last 48 hours)    No results found.       Medical Problem List and Plan: 1. Functional deficits secondary to debility after multiple medical issues/chf. Right foot cellulitis 2. DVT Prophylaxis/Anticoagulation: Pharmaceutical: Coumadin 3. Pain Management: will continue flexeril and oxycodone prn for pain.  4. Anxiety/Mood: continue xanax prn. Team to provide ego support and encouragement. LCSW to follow for evaluation and support.  5. Neuropsych: This patient is capable of making decisions on his own behalf. 6. Skin/Wound Care: Routine pressure relief measures. Maintain adequate nutritional status. Offer supplements  7. Fluids/Electrolytes/Nutrition: Needs to limit water/ice intake to avoid overload and hyponatremia. Recheck labs in am.  8. Acute on chronic systolic CHF/NISM EF 77-93%: Monitor daily weights as well as other signs of overload. Compliance with 1500 cc/FR and low salt diet. On home milrinone as 0.375, demadex 80 mg bid, spironolactone 25 mg bid. Not a good cadidate for advance therapies per cardiology.  9. VT/NSVT: Hypokalemia resolved. To continue ranolazine, amiodarone bid, Kdur 60 mg bid and mag ox daily.  Monitor lytes for stability.  10. A fib: Heart rate controlled. On warfarin.  78. H/o CVA with left hemiparesis: Right foot X rays negative for fracture or arthropathy.  12. CKD: Monitor lytes daily. Renal status stable.    Post Admission Physician Evaluation: 1. Functional deficits secondary to debility after multiple medical/chf. Right foot cellulitis 2. Patient is admitted to receive collaborative, interdisciplinary care between the physiatrist, rehab nursing staff, and therapy team. 3. Patient's level of medical complexity and substantial therapy needs in context of that medical necessity cannot be provided at a lesser intensity of care such as a SNF. 4. Patient has experienced substantial functional loss from his/her baseline which was documented above under the "Functional History" and "Functional Status" headings. Judging by the patient's diagnosis, physical exam, and functional history, the patient has potential for functional progress which will result in measurable gains while on inpatient rehab. These gains will be of substantial and practical use upon discharge in facilitating mobility and self-care at the household level. 5. Physiatrist will provide 24 hour management of medical needs as well as oversight of the therapy plan/treatment and provide guidance as appropriate regarding the interaction of the two. 6. 24 hour rehab nursing will assist with bladder management, bowel management, safety, skin/wound care, disease management, medication administration, pain management and patient education and help integrate therapy concepts, techniques,education, etc. 7. PT will assess and treat for/with: Lower extremity strength, range of motion, stamina, balance, functional mobility, safety, adaptive techniques and equipment, activity tolerance, ego support, community reintegration. Goals are: mod I. 8. OT will assess and treat for/with: ADL's, functional mobility, safety, upper extremity  strength, adaptive techniques and equipment, activity modfication, ego support, family/care giver ed. Goals are: mod I. Therapy may proceed with showering this patient. 9. SLP will assess and treat for/with: n/a. Goals are: n/a. 10. Case Management and Social  Worker will assess and treat for psychological issues and discharge planning. 11. Team conference will be held weekly to assess progress toward goals and to determine barriers to discharge. 12. Patient will receive at least 3 hours of therapy per day at least 5 days per week. 13. ELOS: 8-12 days  14. Prognosis: good     Meredith Staggers, MD, [facility] 04/09/2014

## 2014-04-09 NOTE — Progress Notes (Signed)
Weldon Picking [facility] Admission Coordinator Signed Physical Medicine and Rehabilitation PMR Pre-admission 04/09/2014 11:39 AM  Related encounter: ED to Hosp-Admission (Current) from 03/30/2014 in [facility]    Expand All Collapse All   PMR Admission Coordinator Pre-Admission Assessment  Patient: Raymond [name] is an 54 y.o., male MRN: [id number] DOB: Oct 10, 1960 Height:  (175.3 cm) Weight: 120.611 kg (265 lb 14.4 oz)  Insurance Information HMO: PPO: PCP: IPA: 80/20: OTHER:  PRIMARY: Medicare A and B Policy#: [id number] Subscriber: self CM Name: Phone#: Fax#:  Pre-Cert#: Employer: disabled Benefits: Phone #: Name:  Eff. Date: 10/18/09 Deduct: $1288 Out of Pocket Max: none Life Max: none CIR: 100% SNF: 100% first 20 days per Medicare Outpatient: 80%/20% Co-Pay:  Home Health: 100% per Medicare guidelines Co-Pay:  DME: 80%/20% Co-Pay:  Providers: Pt. choice SECONDARY: Policy#: Subscriber:  CM Name: Phone#: Fax#:  Pre-Cert#: Employer:  Benefits: Phone #: Name:  Eff. Date: Deduct: Out of Pocket Max: Life Max:  CIR: SNF:  Outpatient: Co-Pay:  Home Health: Co-Pay:  DME: Co-Pay:   Medicaid Application Date: Case Manager:  Disability Application Date: Case Worker:   Emergency Contact Information Contact Information    Name Relation Home Work Mobile   Drew,Shirley Mother [phone]   Stanley,Darlene Significant other [phone]       Current Medical History  Patient Admitting Diagnosis: debility after multiple medical/chf. Right foot  cellulitis History of Present Illness: Raymond [name] is a 54 y.o. 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 LOV was 03/27/14 and his Amiodarone was cut back to 200 mg daily and his Torsemide was increased to 40 mg BID and spironolactone was added. Since admission, he was shocked for VT on the evening of 3/12 and again on the evening of 3/13. He was evaluated by Dr Graciela Husbands and started on ranolazine and has been transitioned back to po amiodarone. He was diuresed with milrinone and lasix for acute on chronic systolic CHF with good results. He has had foot pain due to cellulitis and was started on doxycyline for treatment. Therapy initiated and patient limited by right foot pain with inability to weight bear on RLE as well as left hemiparesis due to prior CVA. MD recommending CIR for follow up therapy. Pt. Admitted to CIR on 04/09/14.      Past Medical History  Past Medical History  Diagnosis Date  . Non-ischemic cardiomyopathy     a. echo 11/10: EF 15%; b. cath 10/08: normal cors; c. Myoview 5/12: Very mild distal ant and apical isch, EF 17% (low risk-medical Rx cont'd); d. Echo 4/14: EF 15%; e. 05/2009 s/p SJM BiV ICD; f. 07/2012 TEE: EF 20-25%, diff HK mild MR, mod TR.   Marland Kitchen Atrial fibrillation     a. on coumadin; b. 07/2012 s/p TEE/DCCV. c. Recurrence 08/2012 in setting of abnormal thyroid panel.; 12/2012 AVN ablation by Dr [name]  . Hypertension   . Obesity   . Non-compliance   . Dyslexia     unable to read.  Marland Kitchen History of CVA (cerebrovascular accident) 11/2008    a. 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 RBBB (right bundle branch block)   . Cough syncope  a. During 08/2012 adm.  . Systolic CHF,  chronic 01/19/1996  . Hypothyroidism 01/19/1996    s/p RAI therapy  . Stroke 01/19/2008  . Renal insufficiency     Family History  family history includes Atrial fibrillation in his sister; Diabetes in his father and sister; Heart disease (age of onset: 47) in his father; Heart disease (age of onset: 19) in his mother; Hyperlipidemia in his mother; Hypertension in his mother; Stroke (age of onset: 25) in his father.  Prior Rehab/Hospitalizations: Pt. With prior history of CIR 2010 following a stroke  Current Medications   Current facility-administered medications:  . 0.9 % sodium chloride infusion, , Intravenous, Continuous, Cathren Laine, MD, Stopped at 04/01/14 0430 . 0.9 % sodium chloride infusion, 250 mL, Intravenous, PRN, Abelino Derrick, PA-C, Stopped at 03/31/14 0800 . acetaminophen (TYLENOL) tablet 650 mg, 650 mg, Oral, Q4H PRN, Abelino Derrick, PA-C, 650 mg at 04/03/14 1311 . acyclovir (ZOVIRAX) tablet 400 mg, 400 mg, Oral, Daily, [employer], PA-C, 400 mg at 04/09/14 1610 . ALPRAZolam Prudy Feeler) tablet 0.25 mg, 0.25 mg, Oral, BID PRN, Laurey Morale, MD, 0.25 mg at 04/08/14 2231 . amiodarone (PACERONE) tablet 200 mg, 200 mg, Oral, BID, Laurey Morale, MD, 200 mg at 04/09/14 9604 . cyclobenzaprine (FLEXERIL) tablet 10 mg, 10 mg, Oral, BID, Eda Paschal Cambridge, PA-C, 10 mg at 04/09/14 5409 . doxycycline (VIBRA-TABS) tablet 100 mg, 100 mg, Oral, Q12H, Laurey Morale, MD, 100 mg at 04/09/14 0926 . guaiFENesin-codeine 100-10 MG/5ML solution 5 mL, 5 mL, Oral, Q4H PRN, Marinus Maw, MD, 5 mL at 04/04/14 2251 . guaiFENesin-dextromethorphan (ROBITUSSIN DM) 100-10 MG/5ML syrup 10 mL, 10 mL, Oral, Q4H PRN, Dolores Patty, MD, 10 mL at 03/30/14 2035 . levothyroxine (SYNTHROID, LEVOTHROID) tablet 250 mcg, 250 mcg, Oral, QAC breakfast, Eda Paschal Zaleski, PA-C, 250 mcg at 04/09/14 8119 . magnesium oxide (MAG-OX) tablet 400 mg, 400 mg, Oral, Daily, Laurey Morale, MD, 400 mg at  04/09/14 1478 . milrinone (PRIMACOR) 20 MG/100ML (0.2 mg/mL) infusion, 0.375 mcg/kg/min, Intravenous, Continuous, [employer], PA-C, Last Rate: 14.5 mL/hr at 04/09/14 1100, 0.375 mcg/kg/min at 04/09/14 1100 . nitroGLYCERIN (NITROSTAT) SL tablet 0.4 mg, 0.4 mg, Sublingual, Q5 Min x 3 PRN, Luke K Kilroy, PA-C . ondansetron [facility]) injection 4 mg, 4 mg, Intravenous, Q6H PRN, Abelino Derrick, PA-C, 4 mg at 04/09/14 2956 . oxyCODONE-acetaminophen (PERCOCET/ROXICET) 5-325 MG per tablet 1-2 tablet, 1-2 tablet, Oral, Q6H PRN, Laurey Morale, MD, 2 tablet at 04/07/14 1356 . potassium chloride SA (K-DUR,KLOR-CON) CR tablet 60 mEq, 60 mEq, Oral, BID, Laurey Morale, MD, 60 mEq at 04/09/14 0926 . ranolazine (RANEXA) 12 hr tablet 1,000 mg, 1,000 mg, Oral, BID, Duke Salvia, MD, 1,000 mg at 04/09/14 2130 . sodium chloride 0.9 % injection 10-40 mL, 10-40 mL, Intracatheter, Q12H, Dolores Patty, MD, 10 mL at 04/08/14 2232 . sodium chloride 0.9 % injection 10-40 mL, 10-40 mL, Intracatheter, PRN, Dolores Patty, MD, 10 mL at 04/09/14 0935 . sodium chloride 0.9 % injection 3 mL, 3 mL, Intravenous, Q12H, [employer], PA-C, 3 mL at 04/08/14 2232 . sodium chloride 0.9 % injection 3 mL, 3 mL, Intravenous, PRN, Abelino Derrick, PA-C . spironolactone (ALDACTONE) tablet 25 mg, 25 mg, Oral, BID, Laurey Morale, MD, 25 mg at 04/08/14 1636 . torsemide (DEMADEX) tablet 80 mg, 80 mg, Oral, BID, Laurey Morale, MD, 80 mg at 04/09/14 0934 . Warfarin - Pharmacist Dosing Inpatient, , Does  not apply, q1800, Rosaland Lao, RPH, Stopped at 04/07/14 1800  Patients Current Diet: Diet Heart, fluid restriction 1500 ml, 2 gm Na+  Precautions / Restrictions Precautions Precautions: Fall Restrictions Weight Bearing Restrictions: No   Prior Activity Level Household: Per pt., he typically goes out of the house only for MD appointments. He lives in [place] and sometimes will drive to [place] where his  mother will take him to his appointments. He uses the single point cane in the home. Home Assistive Devices / Equipment Home Assistive Devices/Equipment: Medical laboratory scientific officer (specify quad or straight) Home Equipment: Cane - quad, Cane - single point, Grab bars - tub/shower  Prior Functional Level Prior Function Level of Independence: Independent with assistive device(s) Comments: Amb with straight cane  Current Functional Level Cognition  Overall Cognitive Status: Within Functional Limits for tasks assessed Orientation Level: Oriented X4   Extremity Assessment (includes Sensation/Coordination)  Upper Extremity Assessment: LUE deficits/detail LUE Deficits / Details: very limited use due to significant weakness after stroke  Lower Extremity Assessment: LLE deficits/detail LLE Deficits / Details: grossly 4/5 after stroke    ADLs       Mobility  Overal bed mobility: Needs Assistance Bed Mobility: Supine to Sit Supine to sit: Min assist, HOB elevated Sit to supine: Min assist, HOB elevated General bed mobility comments: Assist to raise trunk to sitting.    Transfers  Overall transfer level: Needs assistance Equipment used: Left platform walker Transfers: Sit to/from Stand Sit to Stand: Min assist, +2 physical assistance Stand pivot transfers: Mod assist, +2 physical assistance General transfer comment: Assist to bring hips up and for balance.    Ambulation / Gait / Stairs / Wheelchair Mobility  Ambulation/Gait Ambulation/Gait assistance: Min assist, +2 physical assistance Ambulation Distance (Feet): 15 Feet Assistive device: Left platform walker Gait Pattern/deviations: Step-to pattern, Decreased step length - left, Decreased step length - right, Decreased stance time - right, Trunk flexed Gait velocity interpretation: Below normal speed for age/gender General Gait Details: Verbal cues to stand more erect and incr step length on lt. Assist for balance and to manuever  walker.     Posture / Balance Balance Overall balance assessment: Needs assistance Sitting-balance support: No upper extremity supported Sitting balance-Leahy Scale: Fair Standing balance support: Bilateral upper extremity supported Standing balance-Leahy Scale: Poor    Special needs/care consideration BiPAP/CPAP no CPM no Continuous Drip IV Yes, milrinone drip currently and prior to admission via PICC line right arm Dialysis no Days Life Vest no Oxygen no Special Bed no Trach Size no Wound Vac (area) no  Skin Per pt., no soreness on buttocks, no issues noted  Bowel mgmt: Last BM 04/05/17 per pt. And RN; I have requested that RN address bowel stimulation with MD. She plans to request a laxative. Bladder mgmt: Pt. Reports continence but with difficulty using urinal consistently due to weakness in left hand/arm. He currently has a condom cath in place as of 04/09/14 am Diabetic mgmt no     Previous Home Environment Living Arrangements: Alone Available Help at Discharge: Family, Available PRN/intermittently Type of Home: House Home Layout: Two level, Able to live on main level with bedroom/bathroom Home Access: Stairs to enter Entrance Stairs-Number of Steps: 1 Home Care Services: Yes Type of Home Care Services: Homehealth aide, Home RN Home Care Agency (if known): [facility]  Discharge Living Setting Plans for Discharge Living Setting: Patient's home Type of Home at Discharge: House Discharge Home Layout: Two level, Able to live on main level with  bedroom/bathroom, Full bath on main level Discharge Home Access: Stairs to enter Entrance Stairs-Rails: None Entrance Stairs-Number of Steps: 1 Discharge Bathroom Shower/Tub: Tub/shower unit Discharge Bathroom Toilet: Standard Discharge Bathroom Accessibility: Yes How Accessible: Accessible via walker Does the patient have any problems obtaining your  medications?: No  Social/Family/Support Systems Patient Roles: Partner Anticipated Caregiver: Pt.'s mom does not assist pt. except for driving him to MD appointments. Girlfriend Sherral Hammers works Monday thru Friday and comes to be with pt. on the weekend.  Ability/Limitations of Caregiver: Per pt's mom , Ralene Ok, she cannot physically assist due to her own heart failure Caregiver Availability: Intermittent Discharge Plan Discussed with Primary Caregiver: Yes Is Caregiver In Agreement with Plan?: Yes Does Caregiver/Family have Issues with Lodging/Transportation while Pt is in Rehab?: No    Goals/Additional Needs Patient/Family Goal for Rehab: mod I with PT and OT, n/a SLP Expected length of stay: 7 days Cultural Considerations: no Equipment Needs: TBA; has been using PFRW on acute this hospitalization Special Service Needs: Pt. is dyslexic and only recognizes simple and common words Pt/Family Agrees to Admission and willing to participate: Yes Program Orientation Provided & Reviewed with Pt/Caregiver Including Roles & Responsibilities: Yes Additional Information Needs: By staff report, pt. has come across as harsh and demanding toward staff. I had a lenghty discussion with pt. to encourage him to be sensitive to expressing his needs in a positive manner. He voices that he will do so.   Decrease burden of Care through IP rehab admission: no   Possible need for SNF placement upon discharge: Not anticipated and pt. Refuses SNF   Patient Condition: This patient's condition remains as documented in the consult dated 04/08/14, in which the Rehabilitation Physician determined and documented that the patient's condition is appropriate for intensive rehabilitative care in an inpatient rehabilitation facility. Will admit to inpatient rehab today.  Preadmission Screen Completed By: Weldon Picking, 04/09/2014 11:47  AM ______________________________________________________________________  Discussed status with Dr. Riley Kill on 04/09/14 at 1155 and received telephone approval for admission today.  Admission Coordinator: Weldon Picking, time 3668 /Date 04/09/14          Cosigned by: [name], MD at 04/09/2014 1:37 PM

## 2014-04-09 NOTE — Discharge Summary (Signed)
Discharge Summary   Patient ID: Raymond [name],  MRN: [id number], DOB/AGE: Nov 17, 1960 54 y.o.  Admit date: 03/30/2014 Discharge date: 04/09/2014  Primary Care Provider: SMITH,KRISTI Primary Cardiologist: D. Bensimhon, MD   Discharge Diagnoses Principal Problem:   Syncope and collapse Active Problems:   Non-ischemic cardiomyopathy   Morbid obesity-BMI43   CKD (chronic kidney disease), stage III   Biventricular implantable cardioverter-defibrillator in situ   PAF-RFA  01/07/13   Hypertensive heart disease   Chronic systolic heart failure   Rt brain CVA 2010-TPA   Hypothyroid-elevated TSH on Amiodarone   Chronic anticoagulation   NSVT- Amiodarone added Feb 2016   Acute on chronic systolic CHF (congestive heart failure)   DNR (do not resuscitate)   VT (ventricular tachycardia)   ICD discharge   Hypokalemia  Allergies Allergies  Allergen Reactions  . Valsartan Cough    Procedures  None  History of Present Illness  54 y/o male with a h/o chronic systolic chf and NICM with an EF of 10-15% on chronic IV milrinone therapy at home.  He is also has a h/o atrial fibrillation s/p CVA and is on chronic coumadin anticoagulation.  He was admitted to [facility] in late January/early February secondary to cardiogenic shock and most recently followed up in clinic on 03/27/2014 at which time his diuretic dosing was increased.  On the morning of 3/12, he stood to use the bathroom and had sudden syncope.  EMS was called and he was taken to the [facility] ED where he was noted to have multiple runs of VT, which were terminated with appropriate ICD therapy in the setting of significant hypokalemia (3.0).  His potassium was aggressively supplemented, he was placed on amiodarone, and admitted for further evaluation.  Hospital Course  Following admission, pt was continued on IV amiodarone and milrinone.  He did have recurrent VT on the evening of 3/13 requiring ICD shock.  He did have volume overload and was  maintained on PO torsemide with initial good diuresis but also with ongoing hypokalemia.  His potassium dose was adjusted further and he was also placed on magnesium supplementation.  CVP's were running 12-13 however co-oximetry measured low at 57%.  Due to VT on admission, he was not felt to be a good candidate for additional titration of milrinone therapy.  On 3/17, he was switched from PO diuretics to a lasix drip due to ongoing volume overload.  Pt continued to have intermittent runs of NSVT despite adequate potassium and magnesium repletion.  Amiodarone was eventually transitioned to a PO formulation and EP was consulted for consideration of another antiarrhythmic.  He was seen on 3/17 with a recommendation to begin ranexa therapy in addition to amiodarone in order further suppress his VT.  With this adjustment, he did have less burden of NSVT.  On 3/15, [name] complained of right foot pain.  Xrays were negative for fracture.  It was felt that cellulitis may be playing a role and he was initially placed on vancomycin and this was later switched to doxycycline.  As of 3/22, he has completed 2.5 days of therapy and will remain on doxycycline for a total of 14 days.  [name] responded well to IV diuresis and was able to be switched back to PO torsemide on 3/19.  In all, he is minus 7.1 Liters for this admission with a discharge weight of 265 lbs.  His creatinine has remained stable and he has been tolerating torsemide 80 mg BID in addition to spironolactone  25 mg BID.  In an effort to avoid further hypokalemia, we hope to avoid having to use metolazone therapy as we go forward.  Due to his gait instability and general deconditioning, he was evaluated and found to be a candidate for inpatient rehabilitation.  He will be discharged to inpatient rehab today.  Provided that he does well in rehab, he has a f/u appt scheduled in CHF clinic in 1 week.  Discharge Vitals Blood pressure 121/73, pulse 70,  temperature 97.4 F (36.3 C), temperature source Oral, resp. rate 27, height  (1.753 m), weight 265 lb 14.4 oz (120.611 kg), SpO2 95 %.  Filed Weights   04/07/14 0418 04/08/14 0329 04/09/14 0430  Weight: 268 lb 15.4 oz (122 kg) 264 lb (119.75 kg) 265 lb 14.4 oz (120.611 kg)   Labs  CBC  Recent Labs  04/08/14 0335 04/09/14 0508  WBC 7.7 9.5  HGB 13.3 13.4  HCT 40.3 41.4  MCV 92.0 92.4  PLT 332 347   Basic Metabolic Panel  Recent Labs  04/08/14 0335 04/09/14 0508  NA 137 136  K 4.1 4.3  CL 94* 98  CO2 35* 32  GLUCOSE 119* 194*  BUN 29* 24*  CREATININE 1.77* 1.57*  CALCIUM 9.3 9.5   Liver Function Tests Lab Results  Component Value Date   ALT 11 03/30/2014   AST 28 03/30/2014   ALKPHOS 109 03/30/2014   BILITOT 1.7* 03/30/2014    Cardiac Enzymes Lab Results  Component Value Date   TROPONINI 0.15* 03/30/2014   Thyroid Function Tests Lab Results  Component Value Date   TSH 2.222 03/30/2014    Disposition  Pt is being discharged home today in good condition.  Follow-up Plans & Appointments  Follow-up Information    Follow up with Marca Ancona, MD On 04/16/2014.   Specialty:  Cardiology   Why:  at 0840am  in the [facility]--gate code 7000- bring all medications to appt.   Contact information:   [address] [place] [address] [place] [address] [phone]       Follow up with Nilda Simmer, MD.   Specialty:  Family Medicine   Why:  as scheduled.   Contact information:   [address] [place] [address] [phone]      Discharge Medications    Medication List    STOP taking these medications        ZAROXOLYN 2.5 MG tablet  Generic drug:  metolazone      TAKE these medications        acyclovir 800 MG tablet  Commonly known as:  ZOVIRAX  Take 400 mg by mouth daily.     ALPRAZolam 0.25 MG tablet  Commonly known as:  XANAX  Take 1 tablet (0.25 mg total) by mouth at bedtime as needed for anxiety.       amiodarone 200 MG tablet  Commonly known as:  PACERONE  Take 1 tablet (200 mg total) by mouth 2 (two) times daily.     cyclobenzaprine 10 MG tablet  Commonly known as:  FLEXERIL  Take 1 tablet (10 mg total) by mouth 2 (two) times daily.     doxycycline 100 MG tablet  Commonly known as:  VIBRA-TABS  Take 1 tablet (100 mg total) by mouth every 12 (twelve) hours.     guaiFENesin-codeine 100-10 MG/5ML syrup  Take 5 mLs by mouth every 4 (four) hours as needed for cough.     guaiFENesin-dextromethorphan 100-10 MG/5ML syrup  Commonly  known as:  ROBITUSSIN DM  Take 10 mLs by mouth every 4 (four) hours as needed for cough.     HYDROcodone-acetaminophen 5-325 MG per tablet  Commonly known as:  NORCO/VICODIN  TAKE 1 TABLET BY MOUTH EVERY 8 HOURS AS NEEDED     levothyroxine 125 MCG tablet  Commonly known as:  SYNTHROID, LEVOTHROID  Take 2 tablets (250 mcg total) by mouth daily before breakfast. Additional refill should come from PCP     magnesium oxide 400 (241.3 MG) MG tablet  Commonly known as:  MAG-OX  Take 1 tablet (400 mg total) by mouth daily.     milrinone 20 MG/100ML Soln infusion  Commonly known as:  PRIMACOR  Inject 0.375 mcg/kg/min into the vein continuous.        Potassium Chloride ER 20 MEQ Tbcr  Take 60 mEq by mouth 2 (two) times daily.     ranolazine 1000 MG SR tablet  Commonly known as:  RANEXA  Take 1 tablet (1,000 mg total) by mouth 2 (two) times daily.     RICOLA Lozg  Use as directed 1 lozenge in the mouth or throat daily as needed (for cough).     spironolactone 25 MG tablet  Commonly known as:  ALDACTONE  Take 1 tablet (25 mg total) by mouth 2 (two) times daily.     torsemide 20 MG tablet  Commonly known as:  DEMADEX  Take 4 tablets (80 mg total) by mouth 2 (two) times daily.     warfarin 7.5 MG tablet  Commonly known as:  COUMADIN  Take 1 tablet daily       Outstanding Labs/Studies  F/u BMET in rehab.  Duration of Discharge Encounter    Greater than 30 minutes including physician time.  Signed, Nicolasa Ducking NP 04/09/2014, 1:24 PM

## 2014-04-09 NOTE — Discharge Instructions (Signed)
***  PLEASE REMEMBER TO BRING ALL OF YOUR MEDICATIONS TO EACH OF YOUR FOLLOW-UP OFFICE VISITS.  

## 2014-04-09 NOTE — Progress Notes (Signed)
[name], MD Physician Signed Physical Medicine and Rehabilitation Consult Note 04/08/2014 8:20 AM  Related encounter: ED to Hosp-Admission (Current) from 03/30/2014 in [facility]    Expand All Collapse All        Physical Medicine and Rehabilitation Consult  Reason for Consult: Debility Due to multiple medical issues.  Referring Physician: Dr. Shirlee Latch   HPI: Raymond [name] is a 54 y.o. 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 LOV was 03/27/14 and his Amiodarone was cut back to 200 mg daily and his Torsemide was increased to 40 mg BID and spironolactone was added. Since admission, he was shocked for VT on the evening of 3/12 and again on the evening of 3/13. He was evaluated by Dr Graciela Husbands and started on ranolazine and has been transitioned back to po amiodarone. He was diuresed with milrinone and lasix for acute on chronic systolic CHF with good results. He has had foot pain due to cellulitis and was started on doxycyline for treatment. Therapy initiated and patient limited by right foot pain with inability to weight bear on RLE as well as left hemiparesis due to prior CVA. MD recommending CIR for follow up therapy.    Review of Systems  HENT: Negative for hearing loss.  Eyes: Negative for blurred vision.  Respiratory: Negative for cough and shortness of breath.  Cardiovascular: Negative for chest pain, palpitations and leg swelling.  Gastrointestinal: Positive for constipation (hasn't had BM for 3 days). Negative for heartburn and nausea.  Musculoskeletal: Positive for myalgias.  Neurological: Positive for focal weakness (left hemiparesis) and weakness. Negative for dizziness and headaches.    Psychiatric/Behavioral: Positive for depression. The patient is nervous/anxious and has insomnia (hasn't slept for 3 days).      Past Medical History  Diagnosis Date  . Non-ischemic cardiomyopathy     a. echo 11/10: EF 15%; b. cath 10/08: normal cors; c. Myoview 5/12: Very mild distal ant and apical isch, EF 17% (low risk-medical Rx cont'd); d. Echo 4/14: EF 15%; e. 05/2009 s/p SJM BiV ICD; f. 07/2012 TEE: EF 20-25%, diff HK mild MR, mod TR.   Marland Kitchen Atrial fibrillation     a. on coumadin; b. 07/2012 s/p TEE/DCCV. c. Recurrence 08/2012 in setting of abnormal thyroid panel.; 12/2012 AVN ablation by Dr [name]  . Hypertension   . Obesity   . Non-compliance   . Dyslexia     unable to read.  Marland Kitchen History of CVA (cerebrovascular accident) 11/2008    a. 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 RBBB (right bundle branch block)   . Cough syncope     a. During 08/2012 adm.  . Systolic CHF, chronic 01/19/1996  . Hypothyroidism 01/19/1996    s/p RAI therapy  . Stroke 01/19/2008  . Renal insufficiency     Past Surgical History  Procedure Laterality Date  . Bi-ventricular implantable cardioverter defibrillator (crt-d)  06/11/09    [employer] [id number] BIVENTRICULAR AICD SERIAL #[id number]  . Knee arthroscopy Left ~ 1995  . Cardiac catheterization      "several time" (09/01/2012)  . Tee without cardioversion N/A 06/23/2012    Procedure: TRANSESOPHAGEAL ECHOCARDIOGRAM (TEE); Surgeon: Vesta Mixer, MD; Location: [facility]  ENDOSCOPY; Service: Cardiovascular; Laterality: N/A; TEE will be done at 0730   . Cardioversion N/A 07/27/2012    Procedure: CARDIOVERSION; Surgeon: Vesta Mixer, MD; Location: [facility] ENDOSCOPY; Service: Cardiovascular; Laterality: N/A;  . Tee without cardioversion N/A 08/10/2012    Procedure: TRANSESOPHAGEAL ECHOCARDIOGRAM (TEE); Surgeon: Wendall Stade,  MD; Location: [facility] ENDOSCOPY; Service: Cardiovascular; Laterality: N/A;  . Cardioversion N/A 08/10/2012    Procedure: CARDIOVERSION; Surgeon: Wendall Stade, MD; Location: [facility] ENDOSCOPY; Service: Cardiovascular; Laterality: N/A;  . Sp pta add intra cran  11/2008    a. 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 Ablation  01/09/2013    AVN ablation by Dr [name]  . Av node ablation N/A 01/09/2013    Procedure: AV NODE ABLATION; Surgeon: Marinus Maw, MD; Location: [facility] CATH LAB; Service: Cardiovascular; Laterality: N/A;  . Right heart catheterization N/A 02/15/2014    Procedure: RIGHT HEART CATH; Surgeon: Dolores Patty, MD; Location: [facility] CATH LAB; Service: Cardiovascular; Laterality: N/A;    Family History  Problem Relation Age of Onset  . Heart disease Father 11    AMI  . Diabetes Father   . Stroke Father 66    CVA x 2  . Diabetes Sister   . Atrial fibrillation Sister   . Hyperlipidemia Mother   . Hypertension Mother   . Heart disease Mother 43    CHF     Social History: Lives alone and independent with cane PTA. Girlfriend assists with home management and meals. Mother lives out of town. He reports that he has never smoked. He has never used smokeless tobacco. He reports that he drinks alcohol. He reports that he does not use illicit drugs.    Allergies  Allergen Reactions  . Valsartan Cough   Medications Prior to Admission  Medication Sig Dispense Refill  . acyclovir (ZOVIRAX) 800 MG tablet Take 400 mg by mouth daily.     Marland Kitchen ALPRAZolam (XANAX) 0.25 MG tablet Take 1 tablet (0.25 mg total) by mouth at bedtime as needed for anxiety. 30 tablet 2  . amiodarone (PACERONE) 200 MG tablet Take 1 tablet (200 mg total) by mouth daily. 60 tablet 6  . cyclobenzaprine (FLEXERIL) 10 MG tablet Take 1 tablet (10 mg total) by mouth 2 (two)  times daily. 20 tablet 0  . DM-Doxylamine-Acetaminophen (NYQUIL COLD & FLU PO) Take 30 mLs by mouth daily as needed (for cold).    Marland Kitchen levothyroxine (SYNTHROID, LEVOTHROID) 125 MCG tablet Take 2 tablets (250 mcg total) by mouth daily before breakfast. Additional refill should come from PCP 60 tablet 3  . Menthol (RICOLA) LOZG Use as directed 1 lozenge in the mouth or throat daily as needed (for cough).    . milrinone (PRIMACOR) 20 MG/100ML SOLN infusion Inject 48.45 mcg/min into the vein continuous. 100 mL   . potassium chloride (K-DUR) 10 MEQ tablet Take 4 tablets (40 mEq total) by mouth daily. 40 tablet 0  . spironolactone (ALDACTONE) 25 MG tablet Take 1 tablet (25 mg total) by mouth daily. 90 tablet 3  . torsemide (DEMADEX) 20 MG tablet Take 3 tablets (60 mg total) by mouth 2 (two) times daily. 60 tablet 3  . warfarin (COUMADIN) 7.5 MG tablet Take 1 tablet daily (Patient taking differently: Take 7.5-11.25 mg by mouth daily at 6 PM. Takes 7.5mg  on Mon, Wed and Fri Takes 11.25mg  all other days) 40 tablet 3  . ZAROXOLYN 2.5 MG tablet Take 1 tablet (2.5 mg total) by mouth daily.  1 tablet 0  . HYDROcodone-acetaminophen (NORCO/VICODIN) 5-325 MG per tablet TAKE 1 TABLET BY MOUTH EVERY 8 HOURS AS NEEDED (Patient not taking: Reported on 03/30/2014) 15 tablet 0    Home: Home Living Family/patient expects to be discharged to:: Private residence Living Arrangements: Spouse/significant other Available Help at Discharge: Family, Available PRN/intermittently Type of Home: House Home Access: Stairs to enter Secretary/administrator of Steps: 1 Home Layout: Two level, Able to live on main level with bedroom/bathroom Home Equipment: Cane - quad, Cane - single point, Grab bars - tub/shower  Functional History: Prior Function Level of Independence: Independent with assistive device(s) Comments: Amb with straight cane Functional Status:  Mobility: Bed  Mobility Overal bed mobility: Needs Assistance Bed Mobility: Supine to Sit, Sit to Supine Supine to sit: Min assist, HOB elevated Sit to supine: Min assist, HOB elevated General bed mobility comments: Assist to raise trunk to sitting position. To return to supine, assist to bring BLE's on to bed. Required +2 max assist to scoot up to [facility]. Transfers Overall transfer level: Needs assistance Equipment used: Straight cane Transfers: Sit to/from Stand, Stand Pivot Transfers Sit to Stand: Min assist, +2 physical assistance Stand pivot transfers: Mod assist, +2 physical assistance General transfer comment: Patient able to move to standing from bed and recliner with +2 min assist to power up and for balance. Min assist for balance in static standing. Patient attempted ambulation - able to take only 2 steps. On pivoting to chair, patient's Rt LE buckled, requiring +2 mod assist to get to chair and control descent. Moved chair closer to bed. Patient able to pivot to bed with min assist. Ambulation/Gait Ambulation/Gait assistance: Min assist, +2 physical assistance Ambulation Distance (Feet): 2 Feet Assistive device: Straight cane Gait Pattern/deviations: Step-to pattern, Decreased step length - right, Decreased step length - left, Decreased stride length, Shuffle, Trunk flexed General Gait Details: Patient able to take 2 steps with +2 assist. Unable to progress due to pain and weakness.    ADL:    Cognition: Cognition Overall Cognitive Status: Within Functional Limits for tasks assessed Orientation Level: Oriented X4 Cognition Arousal/Alertness: Awake/alert Behavior During Therapy: Agitated Overall Cognitive Status: Within Functional Limits for tasks assessed  Blood pressure 134/75, pulse 74, temperature 97.4 F (36.3 C), temperature source Oral, resp. rate 20, height  (1.753 m), weight 119.75 kg (264 lb), SpO2 95 %. Physical Exam  Nursing note and vitals  reviewed. Constitutional: He is oriented to person, place, and time. He appears well-developed and well-nourished.  HENT:  Head: Normocephalic and atraumatic.  Eyes: Conjunctivae and EOM are normal. Pupils are equal, round, and reactive to light.  Neck: No tracheal deviation present. No thyromegaly present.  Cardiovascular: Normal rate and regular rhythm. Exam reveals no friction rub.  No murmur heard. Respiratory: Effort normal and breath sounds normal. No respiratory distress. He has no wheezes.  GI: Soft. Bowel sounds are normal. He exhibits no distension. There is no tenderness.  Musculoskeletal: He exhibits no edema or tenderness.  Right foot with erythema at heel and laterally. No pain with ROM.  Neurological: He is alert and oriented to person, place, and time.  Slow measured speech. He was able to follow basic commands without difficulty. Left hemiparesis UE> LE with sensory deficits. LUE 2+ to 3-/5. LLE: 3/5 HF, 4-KE and 2+ aDF, 3+apf, RUE and RLE 4 to 4+/5, some limitations with movement of right foot. Mild left facial droop. Speech clear.  Skin: Skin is warm and dry.  Psychiatric: His  mood appears anxious. His affect is blunt. His speech is delayed. He is slowed.     Lab Results Last 24 Hours    Results for orders placed or performed during the hospital encounter of 03/30/14 (from the past 24 hour(s))  Protime-INR Status: Abnormal   Collection Time: 04/08/14 3:35 AM  Result Value Ref Range   Prothrombin Time 32.6 (H) 11.6 - 15.2 seconds   INR 3.15 (H) 0.00 - 1.49  CBC Status: None   Collection Time: 04/08/14 3:35 AM  Result Value Ref Range   WBC 7.7 4.0 - 10.5 K/uL   RBC 4.38 4.22 - 5.81 MIL/uL   Hemoglobin 13.3 13.0 - 17.0 g/dL   HCT 16.1 09.6 - 04.5 %   MCV 92.0 78.0 - 100.0 fL   MCH 30.4 26.0 - 34.0 pg   MCHC 33.0 30.0 - 36.0 g/dL   RDW 40.9 81.1 - 91.4 %   Platelets 332 150 - 400 K/uL   Basic metabolic panel Status: Abnormal   Collection Time: 04/08/14 3:35 AM  Result Value Ref Range   Sodium 137 135 - 145 mmol/L   Potassium 4.1 3.5 - 5.1 mmol/L   Chloride 94 (L) 96 - 112 mmol/L   CO2 35 (H) 19 - 32 mmol/L   Glucose, Bld 119 (H) 70 - 99 mg/dL   BUN 29 (H) 6 - 23 mg/dL   Creatinine, Ser 7.82 (H) 0.50 - 1.35 mg/dL   Calcium 9.3 8.4 - 95.6 mg/dL   GFR calc non Af Amer 43 (L) >90 mL/min   GFR calc Af Amer 50 (L) >90 mL/min   Anion gap 8 5 - 15  Carboxyhemoglobin Status: None   Collection Time: 04/08/14 3:37 AM  Result Value Ref Range   Total hemoglobin 13.7 13.5 - 18.0 g/dL   O2 Saturation 21.3 %   Carboxyhemoglobin 1.0 0.5 - 1.5 %   Methemoglobin 0.9 0.0 - 1.5 %      Imaging Results (Last 48 hours)    No results found.    Assessment/Plan: Diagnosis: debility after multiple medical/chf. Right foot cellulitis 1. Does the need for close, 24 hr/day medical supervision in concert with the patient's rehab needs make it unreasonable for this patient to be served in a less intensive setting? Yes 2. Co-Morbidities requiring supervision/potential complications: hx of right CVA, htn, ckd, multiple cardiac dx'es 3. Due to bladder management, bowel management, safety, skin/wound care, disease management, medication administration, pain management and patient education, does the patient require 24 hr/day rehab nursing? Yes 4. Does the patient require coordinated care of a physician, rehab nurse, PT (1-2 hrs/day, 5 days/week) and OT (1-2 hrs/day, 5 days/week) to address physical and functional deficits in the context of the above medical diagnosis(es)? Yes Addressing deficits in the following areas: balance, endurance, locomotion, strength, transferring, bowel/bladder control, bathing, dressing, feeding, grooming, toileting and psychosocial support 5. Can the patient actively participate in an  intensive therapy program of at least 3 hrs of therapy per day at least 5 days per week? Yes 6. The potential for patient to make measurable gains while on inpatient rehab is excellent 7. Anticipated functional outcomes upon discharge from inpatient rehab are modified independent with PT, modified independent with OT, n/a with SLP. 8. Estimated rehab length of stay to reach the above functional goals is: 7 days 9. Does the patient have adequate social supports and living environment to accommodate these discharge functional goals? Yes 10. Anticipated D/C setting: Home 11. Anticipated post D/C treatments: HH  therapy and Outpatient therapy 12. Overall Rehab/Functional Prognosis: excellent  RECOMMENDATIONS: This patient's condition is appropriate for continued rehabilitative care in the following setting: CIR Patient has agreed to participate in recommended program. Yes Note that insurance prior authorization may be required for reimbursement for recommended care.  Comment: Rehab Admissions Coordinator to follow up.  Thanks,  [name], MD, [place]     04/08/2014

## 2014-04-09 NOTE — Clinical Social Work Note (Signed)
CSW noted that patient is being admitted into CIR today once medically cleared.   Clinical Social Worker will sign off for now as social work intervention is no longer needed. Please consult [place] again if new need arises.  Derenda Fennel, MSW, LCSWA [phone] 04/09/2014 1:45 PM

## 2014-04-10 ENCOUNTER — Inpatient Hospital Stay (HOSPITAL_COMMUNITY): Payer: Medicare Other | Admitting: Physical Therapy

## 2014-04-10 ENCOUNTER — Inpatient Hospital Stay (HOSPITAL_COMMUNITY): Payer: Medicare Other | Admitting: Occupational Therapy

## 2014-04-10 LAB — BASIC METABOLIC PANEL
ANION GAP: 4 — AB (ref 5–15)
BUN: 26 mg/dL — AB (ref 6–23)
CHLORIDE: 99 mmol/L (ref 96–112)
CO2: 32 mmol/L (ref 19–32)
Calcium: 9.3 mg/dL (ref 8.4–10.5)
Creatinine, Ser: 1.83 mg/dL — ABNORMAL HIGH (ref 0.50–1.35)
GFR calc non Af Amer: 41 mL/min — ABNORMAL LOW (ref >90)
GFR, EST AFRICAN AMERICAN: 48 mL/min — AB (ref >90)
Glucose, Bld: 131 mg/dL — ABNORMAL HIGH (ref 70–99)
POTASSIUM: 4.6 mmol/L (ref 3.5–5.1)
Sodium: 135 mmol/L (ref 135–145)

## 2014-04-10 LAB — PROTIME-INR
INR: 3.82 — AB (ref 0.00–1.49)
PROTHROMBIN TIME: 37.9 s — AB (ref 11.6–15.2)

## 2014-04-10 LAB — CBC WITH DIFFERENTIAL/PLATELET
Basophils Absolute: 0.1 10*3/uL (ref 0.0–0.1)
Basophils Relative: 0 % (ref 0–1)
EOS ABS: 0.1 10*3/uL (ref 0.0–0.7)
Eosinophils Relative: 1 % (ref 0–5)
HCT: 39.7 % (ref 39.0–52.0)
HEMOGLOBIN: 13 g/dL (ref 13.0–17.0)
LYMPHS ABS: 1.1 10*3/uL (ref 0.7–4.0)
Lymphocytes Relative: 9 % — ABNORMAL LOW (ref 12–46)
MCH: 30.2 pg (ref 26.0–34.0)
MCHC: 32.7 g/dL (ref 30.0–36.0)
MCV: 92.1 fL (ref 78.0–100.0)
Monocytes Absolute: 1 10*3/uL (ref 0.1–1.0)
Monocytes Relative: 8 % (ref 3–12)
NEUTROS ABS: 9.6 10*3/uL — AB (ref 1.7–7.7)
NEUTROS PCT: 82 % — AB (ref 43–77)
Platelets: 340 10*3/uL (ref 150–400)
RBC: 4.31 MIL/uL (ref 4.22–5.81)
RDW: 15.7 % — ABNORMAL HIGH (ref 11.5–15.5)
WBC: 11.8 10*3/uL — AB (ref 4.0–10.5)

## 2014-04-10 MED ORDER — POTASSIUM CHLORIDE CRYS ER 20 MEQ PO TBCR
40.0000 meq | EXTENDED_RELEASE_TABLET | Freq: Two times a day (BID) | ORAL | Status: DC
  Administered 2014-04-10 – 2014-04-11 (×3): 40 meq via ORAL
  Filled 2014-04-10 (×5): qty 2

## 2014-04-10 NOTE — Progress Notes (Signed)
ANTICOAGULATION CONSULT NOTE - Follow Up Consult  Pharmacy Consult for coumadin Indication: atrial fibrillation  Allergies  Allergen Reactions  . Valsartan Cough    Patient Measurements: Height: 5\' 9"  (175.3 cm) Weight: 263 lb 10.7 oz (119.6 kg) IBW/kg (Calculated) : 70.7 Heparin Dosing Weight:   Vital Signs: Temp: 98.8 F (37.1 C) (03/23 0300) Temp Source: Oral (03/23 0300) BP: 113/80 mmHg (03/23 0815) Pulse Rate: 69 (03/23 0813)  Labs:  Recent Labs  04/08/14 0335 04/09/14 0508 04/10/14 0557  HGB 13.3 13.4 13.0  HCT 40.3 41.4 39.7  PLT 332 347 340  LABPROT 32.6* 36.4* 37.9*  INR 3.15* 3.62* 3.82*  CREATININE 1.77* 1.57* 1.83*    Estimated Creatinine Clearance: 61 mL/min (by C-G formula based on Cr of 1.83).   Medications:  Scheduled:  . acyclovir  400 mg Oral Daily  . amiodarone  200 mg Oral BID  . cyclobenzaprine  10 mg Oral BID  . doxycycline  100 mg Oral Q12H  . levothyroxine  250 mcg Oral QAC breakfast  . magnesium oxide  400 mg Oral Daily  . potassium chloride  60 mEq Oral BID  . ranolazine  1,000 mg Oral BID  . spironolactone  25 mg Oral BID  . torsemide  80 mg Oral BID  . Warfarin - Pharmacist Dosing Inpatient   Does not apply q1800   Infusions:  . milrinone 0.375 mcg/kg/min (04/10/14 0429)    Assessment: 54 yo male with hx of CVA and afib is currently on supratherapeutic coumadin.  INR today is 3.82.  On amiodarone.  Goal of Therapy:  INR 2-3 Monitor platelets by anticoagulation protocol: Yes   Plan:  Continue to hold coumadin INR in am  Winnifred Dufford, Tsz-Yin 04/10/2014,8:26 AM

## 2014-04-10 NOTE — Progress Notes (Signed)
Patient information reviewed and entered into eRehab system by Akyia Borelli, RN, CRRN, PPS Coordinator.  Information including medical coding and functional independence measure will be reviewed and updated through discharge.     Per nursing patient was given "Data Collection Information Summary for Patients in Inpatient Rehabilitation Facilities with attached "Privacy Act Statement-Health Care Records" upon admission.  

## 2014-04-10 NOTE — Evaluation (Signed)
Physical Therapy Assessment and Plan  Patient Details  Name: Raymond [name] MRN: [id number] Date of Birth: August 18, 1960  PT Diagnosis: Abnormality of gait, Ataxic gait, Cognitive deficits, Coordination disorder, Difficulty walking, Dizziness and giddiness, Edema, Hemiplegia non-dominant, Hypertonia, Impaired cognition, Impaired sensation, Muscle weakness and Pain in R ankle Rehab Potential: Fair ELOS: 7-10 days   Today's Date: 04/10/2014 PT Individual Time: 1030-1140 Treatment Session 2: 1500-1550 PT Individual Time Calculation (min): 70 min  Treatment Session 2: 50 min  Problem List:  Patient Active Problem List   Diagnosis Date Noted  . Physical debility 04/09/2014  . Left hemiparesis 04/09/2014  . Syncope and collapse 03/30/2014  . VT (ventricular tachycardia) 03/30/2014  . ICD discharge 03/30/2014  . Dyslexia 02/27/2014  . DNR (do not resuscitate) 02/22/2014  . Palliative care encounter 02/20/2014  . Shortness of breath 02/20/2014  . Sleep disturbance 02/20/2014  . Acute on chronic renal failure   . Cardiogenic shock   . Acute on chronic systolic CHF (congestive heart failure) 02/14/2014  . Noncompliance 05/02/2013  . Encounter for therapeutic drug monitoring 04/12/2013  . NSVT- Amiodarone added Feb 2016 02/28/2013  . Hypothyroid-elevated TSH on Amiodarone 02/27/2013  . Chronic anticoagulation 02/27/2013  . PAF-RFA  01/07/13 02/02/2013  . Acute on chronic clinical systolic heart failure 63/84/6659  . CKD (chronic kidney disease), stage III 08/09/2012  . 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 Non-ischemic cardiomyopathy   . Chronic systolic heart failure   . Morbid obesity-BMI43   . Biventricular implantable cardioverter-defibrillator in situ 06/26/2009  . Hypertensive heart disease     Past Medical History:  Past Medical History  Diagnosis Date  . Non-ischemic cardiomyopathy     a. echo 11/10: EF 15%;   b. cath 10/08:  normal cors;  c. Myoview 5/12: Very mild distal ant and apical isch, EF 17% (low risk-medical Rx cont'd);  d.  Echo 4/14:  EF 15%;  e. 05/2009 s/p SJM BiV ICD;  f. 07/2012 TEE: EF 20-25%, diff HK mild MR, mod TR.   Marland Kitchen Atrial fibrillation     a. on coumadin;  b. 07/2012 s/p TEE/DCCV. c. Recurrence 08/2012 in setting of abnormal thyroid panel.; 12/2012 AVN ablation by Dr Lovena Le  . Hypertension   . Obesity   . Non-compliance   . Dyslexia     unable to read.  Marland Kitchen History of CVA (cerebrovascular accident) 11/2008    a. 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 RBBB (right bundle branch block)   . Cough syncope     a. During 08/2012 adm.  . Systolic CHF, chronic 09/20/5699  . Hypothyroidism 01/19/1996    s/p RAI therapy  . Stroke 01/19/2008  . Renal insufficiency    Past Surgical History:  Past Surgical History  Procedure Laterality Date  . Bi-ventricular implantable cardioverter defibrillator  (crt-d)  06/11/09    [place] UNIFY [id number] BIVENTRICULAR AICD SERIAL #[id number]  . Knee arthroscopy Left ~ 1995  . Cardiac catheterization      "several time" (09/01/2012)  . Tee without cardioversion N/A 06/23/2012    Procedure: TRANSESOPHAGEAL ECHOCARDIOGRAM (TEE);  Surgeon: Thayer Headings, MD;  Location: [facility] ENDOSCOPY;  Service: Cardiovascular;  Laterality: N/A;  TEE will be done at 0730   . Cardioversion N/A 07/27/2012    Procedure: CARDIOVERSION;  Surgeon: Thayer Headings, MD;  Location: [facility] ENDOSCOPY;  Service: Cardiovascular;  Laterality: N/A;  . Tee without cardioversion N/A  08/10/2012    Procedure: TRANSESOPHAGEAL ECHOCARDIOGRAM (TEE);  Surgeon: Josue Hector, MD;  Location: [facility] ENDOSCOPY;  Service: Cardiovascular;  Laterality: N/A;  . Cardioversion N/A 08/10/2012    Procedure: CARDIOVERSION;  Surgeon: Josue Hector, MD;  Location: [facility] ENDOSCOPY;  Service: Cardiovascular;  Laterality: N/A;  . Sp pta add intra cran  11/2008    a. 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 tx with tPA-> PTA and  stenting(09/01/2012)  . Ablation  01/09/2013    AVN ablation by Dr Lovena Le  . Av node ablation N/A 01/09/2013    Procedure: AV NODE ABLATION;  Surgeon: Evans Lance, MD;  Location: [facility] CATH LAB;  Service: Cardiovascular;  Laterality: N/A;  . Right heart catheterization N/A 02/15/2014    Procedure: RIGHT HEART CATH;  Surgeon: Jolaine Artist, MD;  Location: [facility] CATH LAB;  Service: Cardiovascular;  Laterality: N/A;    Assessment & Plan Clinical Impression:  [name] is a 54 y.o. 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 CXR with evidence of fluid overload, K+ at low normal levels and patient reported weight up by 4 lbs. Past admission, he was shocked for VT on the evening of 3/12 and again on the evening of 3/13. He was started on IV amiodarone and magnesium/potassium supplemented.  He was evaluated by Dr Caryl Comes and started on ranolazine and has been transitioned back to po amiodarone. He was diuresed with milrinone and lasix for acute on chronic systolic CHF with good results. Co-ox levels elevated today and recheck pending. He has had foot pain due to cellulitis and was started on doxycyline for treatment. Therapy initiated and patient noted to be significantly deconditioned and has been limited by right foot pain as well as left hemiparesis due to prior CVA. Patient transferred to CIR on 04/09/2014 .   Patient currently requires min with mobility secondary to muscle weakness, decreased cardiorespiratoy endurance, abnormal tone, ataxia and decreased coordination, decreased memory and delayed processing and decreased sitting balance, decreased standing balance, hemiplegia and decreased balance strategies.  Prior to hospitalization, patient was modified independent   with mobility and lived with   in a House home.  Home access is 1Stairs to enter.  Patient will benefit from skilled PT intervention to maximize safe functional mobility, minimize fall risk and decrease caregiver burden for planned discharge home with intermittent assist.  Anticipate patient will benefit from follow up [facility] at discharge.  PT - End of Session Activity Tolerance: Tolerates < 10 min activity with changes in vital signs (pt is orthostatic) Endurance Deficit: Yes Endurance Deficit Description: cardiorespiratory deficit PT Assessment Rehab Potential (ACUTE/IP ONLY): Fair Barriers to Discharge: Inaccessible home environment;Decreased caregiver support PT Patient demonstrates impairments in the following area(s): Balance;Behavior;Edema;Endurance;Motor;Pain;Safety;Sensory PT Transfers Functional Problem(s): Bed Mobility;Bed to Chair;Car;Furniture PT Locomotion Functional Problem(s): Ambulation;Wheelchair Mobility;Stairs PT Plan PT Intensity: Minimum of 1-2 x/day ,45 to 90 minutes PT Frequency: 5 out of 7 days PT Duration Estimated Length of Stay: 7-10 days PT Treatment/Interventions: Ambulation/gait training;Discharge planning;Functional mobility training;Psychosocial support;Therapeutic Activities;Balance/vestibular training;Disease management/prevention;Neuromuscular re-education;Therapeutic Exercise;Wheelchair propulsion/positioning;Cognitive remediation/compensation;DME/adaptive equipment instruction;Pain management;Splinting/orthotics;UE/LE Strength taining/ROM;Community reintegration;Patient/family education;Functional electrical stimulation;Stair training;UE/LE Coordination activities PT Transfers Anticipated Outcome(s): Mod I with SPC PT Locomotion Anticipated Outcome(s): Mod I ambulation x 30' PT Recommendation Follow Up Recommendations: Home health PT Patient  destination: Home Equipment Recommended: To be determined Equipment Details: Pt already owns a [facility] (for transfers and  short distance ambulation) and QC (for use on step to enter/exit home); no w/c and no leg brace  Skilled Therapeutic Intervention Treatment Session 1: PT Evaluation: PT evaluation initiated - pt refusing to attempt ambulation on level surface or stairs today, due to R ankle pain. Pt presents with extremely low activity tolerance and orthostatic hypotension. Pt has a documented PMH of noncompliance and pt demonstrates behavior that interferes with pt's participation. Pt does not follow commands well, but will sometimes work with PT when asked or given choices. Pt's residual L hemiplegia also interferes with pt's functional mobility ability. Pt currently req Supervision for transfers and CGA for stand-step transfers. Pt initial req supervision for w/c propulsion with R arm and R leg, but quickly progresses to needing min A as pt fatigues. Pt will benefit from IPR PT to maximize safety with functional independence.   Therapeutic Activity: PT instructs pt in scooting up towards [place] in flat bed with B bedrails and PT providing min A to stabilize pt's feet on reduced friction sheets.  PT instructs pt in supine to sit transfer from almost flat bed (HOB elevated slightly to simulate pt's bed at home since he sleeps on 4 pillows) req SBA with SPC.  PT instructs pt in sit to stand with SPC req SBA for safety, and then CGA for stand-step transfer bed to w/c.  Pt req multiple rest breaks in between supine, sit, and stand position due to orthostatic hypotension.   W/C Management: PT instructs pt in w/c propulsion with R UE and R LE x 20' req SBA progressing to min A as pt fatigues.   Pt ended sitting up in w/c with all needs in reach and mother present.   Treatment Session 2: Therapeutic Activity: Pt received sitting EOB after OT session, c/o pain in R ankle and refusing to stand or transfer into w/c with PT. Pt reports shaking of hands and says this is because he is "about to pass out". PT measured pt's  vitals (see flow sheet) and all are normal, but PT allowed pt to transfer sit to supine in bed without rails req SBA for safety, since he wished to do this.  Pt demonstrates ability to roll L in bed with bedrail mod I, but req min A to roll R in bed with bedrail (due to L UE hemiplegia).   Therapeutic Exercise: PT instructs pt in bed level ROM and strengthening exercises with stabilization assist to L LE as needed: ankle pumps x 20, heel slides x 10, supine hip abduction moving both legs at the same time x 20, hip ER/IR in B hook lie x 20, bridges with PT stabilizing pt's L foot x 20, side lie clam shells: 2 x 10 reps each side.   Pt demonstrates inconsistent functional mobility and is frequently able to do more than he thinks he can. However, at the same time, pt's safety awareness is questionable and this PT is unsure how aware pt is of his limitations.    PT Evaluation Precautions/Restrictions Precautions Precautions: Fall;ICD/Pacemaker;Other (comment) Precaution Comments: pacemaker placed 5 years ago; L side hemiplegia: L arm weaker than L leg Restrictions Weight Bearing Restrictions: No General Chart Reviewed: Yes Family/Caregiver Present: Yes (shirley Drew - mom)  Vital SignsTherapy Vitals Pulse Rate: 70 BP: 120/76 mmHg Patient Position (if appropriate): Lying Oxygen Therapy SpO2: 97 % O2 Device: Not Delivered Pain Pain Assessment Pain  Assessment: No/denies pain  Treatment Session 2: Pt c/o 4/10 R ankle pain; PT uses repositioning to decrease pt's pain.  Home Living/Prior Functioning Home Living Available Help at Discharge: Family;Available PRN/intermittently Type of Home: House Home Access: Stairs to enter Entrance Stairs-Number of Steps: 1 Entrance Stairs-Rails: None (Pt uses a quad cane to ascend step) Home Layout: Two level;Able to live on main level with bedroom/bathroom Alternate Level Stairs-Number of Steps: one flight upstairs Alternate Level Stairs-Rails: Can  reach both Additional Comments: hand held shower head, but has had a PICC line x 2 months for heart medication Prior Function Level of Independence: Requires assistive device for independence  Able to Take Stairs?: Yes (one step) Driving: Yes Vocation: On disability Comments: limited driving (1 mile to store & back), ambulates with a straight cane Cognition Overall Cognitive Status: Within Functional Limits for tasks assessed Arousal/Alertness: Awake/alert Orientation Level: Oriented X4 Attention: Focused Focused Attention: Appears intact Memory: Impaired (3/4 words recalled) Memory Impairment: Decreased recall of new information;Decreased short term memory Decreased Short Term Memory: Verbal basic Awareness: Impaired Awareness Impairment: Intellectual impairment Comments: pt believes L arm is "dead weight" and doesn't work, but pt has good PROM and fair AROM Sensation Sensation Light Touch: Impaired Detail Light Touch Impaired Details: Impaired LLE;Impaired LUE;Impaired RLE Stereognosis: Not tested Hot/Cold: Not tested Proprioception: Appears Intact Additional Comments: diminished to LT L arm and L leg; hypersensitive R ankle Coordination Gross Motor Movements are Fluid and Coordinated: No Fine Motor Movements are Fluid and Coordinated: No Coordination and Movement Description: L UE moves in flexor synergy Finger Nose Finger Test: impaired L side: decreased accuracy and decreased smooth track Heel Shin Test: impaired L side: decreased smooth track with fast movement Motor  Motor Motor: Hemiplegia;Abnormal tone;Primitive reflexes present;Ataxia Motor - Skilled Clinical Observations: 2 beats L ankle clonus  Mobility Bed Mobility Bed Mobility: Supine to Sit;Scooting to HOB Supine to Sit: 5: Supervision;HOB elevated;Other (comment) (with R SPC) Scooting to HOB: 4: Min assist Scooting to [facility] Details: Manual facilitation for placement Scooting to [facility] Details (indicate cue type  and reason): PT stabilizes pt's feet so he can use them to help scoot up in bed,  Transfers Transfers: Yes Sit to Stand: 5: Supervision;With upper extremity assist;From bed Sit to Stand Details: Verbal cues for precautions/safety Stand Pivot Transfers: 4: Min guard;With armrests Locomotion  Ambulation Ambulation: No Gait Gait: No Stairs / Additional Locomotion Stairs: No Wheelchair Mobility Wheelchair Mobility: Yes Wheelchair Assistance: 4: Advertising account executive Details: Multimedia programmer for placement Wheelchair Propulsion: Right upper extremity;Right lower extremity Wheelchair Parts Management: Needs assistance Distance: 25  Trunk/Postural Assessment  Cervical Assessment Cervical Assessment: Within Functional Limits Thoracic Assessment Thoracic Assessment: Within Functional Limits Lumbar Assessment Lumbar Assessment: Within Functional Limits Postural Control Postural Control: Within Functional Limits  Balance Balance Balance Assessed: Yes Static Sitting Balance Static Sitting - Balance Support: Bilateral upper extremity supported;Feet supported Static Sitting - Level of Assistance: 5: Stand by assistance Dynamic Sitting Balance Dynamic Sitting - Balance Support: Left upper extremity supported;Right upper extremity supported;Feet supported;During functional activity Dynamic Sitting - Level of Assistance: 5: Stand by assistance Static Standing Balance Static Standing - Balance Support: Right upper extremity supported;During functional activity Static Standing - Level of Assistance: 5: Stand by assistance Dynamic Standing Balance Dynamic Standing - Balance Support: Right upper extremity supported;During functional activity Dynamic Standing - Level of Assistance: 4: Min assist Extremity Assessment  RUE Assessment RUE Assessment: Within Functional Limits LUE Assessment LUE Assessment: Exceptions to [facility] LUE AROM (  degrees) Overall AROM Left Upper Extremity:  Within functional limits for tasks assessed LUE PROM (degrees) Overall PROM Left Upper Extremity: Within functional limits for tasks assessed LUE Strength LUE Overall Strength: Deficits;Due to premorbid status LUE Overall Strength Comments: grossly 4+/5 LUE Tone LUE Tone: Modified Ashworth Modified Ashworth Scale for Grading Hypertonia LUE: Slight increase in muscle tone, manifested by a catch, followed by minimal resistance throughout the remainder (less than half) of the ROM LUE Tone Comments: L arm and L leg RLE Assessment RLE Assessment: Within Functional Limits LLE Assessment LLE Assessment: Exceptions to WFL LLE AROM (degrees) Overall AROM Left Lower Extremity: Within functional limits for tasks assessed LLE PROM (degrees) Overall PROM Left Lower Extremity: Within functional limits for tasks assessed LLE Strength LLE Overall Strength: Deficits;Due to premorbid status LLE Overall Strength Comments: grossly 4+/5 LLE Tone LLE Tone: Modified Ashworth Modified Ashworth Scale for Grading Hypertonia LLE: Slight increase in muscle tone, manifested by a catch, followed by minimal resistance throughout the remainder (less than half) of the ROM LLE Tone Comments: knee flexors  FIM:    See Flow Sheet  Refer to Care Plan for Long Term Goals  Recommendations for other services: None  Discharge Criteria: Patient will be discharged from PT if patient refuses treatment 3 consecutive times without medical reason, if treatment goals not met, if there is a change in medical status, if patient makes no progress towards goals or if patient is discharged from hospital.  The above assessment, treatment plan, treatment alternatives and goals were discussed and mutually agreed upon: by patient and by family  [facility] M 04/10/2014, 11:56 AM

## 2014-04-10 NOTE — Progress Notes (Signed)
[place] PHYSICAL MEDICINE & REHABILITATION     PROGRESS NOTE    Subjective/Complaints: Had a good night. Denies pain. Able to sleep. Good appetite. No sob, cough, n/v  Objective: Vital Signs: Blood pressure 114/67, pulse 71, temperature 98.8 F (37.1 C), temperature source Oral, resp. rate 18, height  (1.753 m), weight 119.6 kg (263 lb 10.7 oz), SpO2 96 %. No results found.  Recent Labs  04/09/14 0508 04/10/14 0557  WBC 9.5 11.8*  HGB 13.4 13.0  HCT 41.4 39.7  PLT 347 340    Recent Labs  04/09/14 0508 04/10/14 0557  NA 136 135  K 4.3 4.6  CL 98 99  GLUCOSE 194* 131*  BUN 24* 26*  CREATININE 1.57* 1.83*  CALCIUM 9.5 9.3   CBG (last 3)   Recent Labs  04/08/14 1652  GLUCAP 123*    Wt Readings from Last 3 Encounters:  04/10/14 119.6 kg (263 lb 10.7 oz)  04/09/14 120.611 kg (265 lb 14.4 oz)  03/27/14 122.925 kg (271 lb)    Physical Exam:  Constitutional: He is oriented to person, place, and time. He appears well-developed and well-nourished.  HENT: dentition fair. Mucosa moist Head: Normocephalic and atraumatic.  Eyes: Conjunctivae and EOM are normal. Pupils are equal, round, and reactive to light.  Neck: No tracheal deviation present. No thyromegaly present.  Cardiovascular: Normal rate and regular rhythm. Exam reveals no friction rub.  No murmur heard. Respiratory: Effort normal and breath sounds normal. No respiratory distress. He has no wheezes.  GI: Soft. Bowel sounds are normal. He exhibits no distension. There is no tenderness.  Musculoskeletal: He exhibits no edema or tenderness.  Right foot with erythema at heel and laterally. No pain with ROM. mild pain with palpation over the mid foot. Neurological: He is alert and oriented to person, place, and time.  Slow measured speech. He was able to follow basic commands without difficulty. Left hemiparesis UE> LE with sensory deficits. LUE 2+ to 3-/5. LLE: 3/5 HF, 4-KE and 2+ aDF, 3+apf,  RUE and RLE 4 to 4+/5, some limitations with movement of right foot. Mild left facial droop. Speech clear.  Skin: Skin is warm and dry.  Psychiatric: His mood appears is fairly appropriate.  His speech is delayed. He is slowed.    Assessment/Plan: 1. Functional deficits secondary to debility after chf/cellulitis which require 3+ hours per day of interdisciplinary therapy in a comprehensive inpatient rehab setting. Physiatrist is providing close team supervision and 24 hour management of active medical problems listed below. Physiatrist and rehab team continue to assess barriers to discharge/monitor patient progress toward functional and medical goals. FIM:                   Comprehension Comprehension Mode: Auditory Comprehension: 7-Follows complex conversation/direction: With no assist  Expression Expression Mode: Verbal Expression: 7-Expresses complex ideas: With no assist  Social Interaction Social Interaction: 6-Interacts appropriately with others with medication or extra time (anti-anxiety, antidepressant).  Problem Solving Problem Solving: 5-Solves basic 90% of the time/requires cueing < 10% of the time  Memory Memory: 7-Complete Independence: No helper Medical Problem List and Plan: 1. Functional deficits secondary to debility after multiple medical issues/chf. Right foot cellulitis 2. DVT Prophylaxis/Anticoagulation: Pharmaceutical: Coumadin 3. Pain Management: will continue flexeril and oxycodone prn for pain.  4. Anxiety/Mood: continue xanax prn. Team to provide ego support and encouragement. LCSW to follow for evaluation and support.  5. Neuropsych: This patient is capable of making decisions on his own  behalf. 6. Skin/Wound Care: Routine pressure relief measures. Maintain adequate nutritional status. Offer supplements  7. Fluids/Electrolytes/Nutrition: Needs to limit water/ice intake to avoid overload and hyponatremia.  8. Acute on chronic systolic  CHF/NISM EF 10-15%: Monitor daily weights as well as other signs of overload and compliance with 1500 cc/FR and low salt diet. On home milrinone as 0.375, demadex 80 mg bid, spironolactone 25 mg bid. Not a good cadidate for advance therapies per cardiology. 119kg 9. VT/NSVT: Hypokalemia resolved. To continue ranolazine, amiodarone bid, Kdur 60 mg bid and mag ox daily. Monitor lytes for stability.  10. A fib: Heart rate controlled. On warfarin.  11. H/o CVA with left hemiparesis 12. CKD: Monitor lytes daily. Renal status at baseline 13. Leukocytosis: 11.8  -no clinical signs of infection  -remains on doxycycline  -re-check counts tomorrow   . LOS (Days) 1 A FACE TO FACE EVALUATION WAS PERFORMED  Raymond [name] T 04/10/2014 7:58 AM

## 2014-04-10 NOTE — Progress Notes (Signed)
Patient has been active with [facility] Care Management for chronic disease management services. Patient has been engaged by a [employer] and LCSW. Our community based plan of care has focused on disease management and community resource support. Patient will receive a post discharge transition of care call and will be evaluated for monthly home visits for assessments and disease process education.Of note, [facility] Care Management services does not replace or interfere with any services that are arranged by rehabilitation case management or social work. For additional questions or referrals please contact: Charlesetta Shanks, RN BSN CCM [facility] [phone] business mobile phone

## 2014-04-10 NOTE — Plan of Care (Signed)
Problem: RH BOWEL ELIMINATION Goal: RH STG MANAGE BOWEL WITH ASSISTANCE STG Manage Bowel with min Assistance.  Outcome: Not Progressing Pt required disimpaction during my shift

## 2014-04-10 NOTE — Evaluation (Signed)
Occupational Therapy Assessment and Plan  Patient Details  Name: Raymond [name] MRN: [id number] Date of Birth: 1961-01-06  OT Diagnosis: acute pain, hemiplegia affecting non-dominant side, muscular wasting and disuse atrophy, muscle weakness (generalized) and progressive muscular atrophy Rehab Potential: Rehab Potential (ACUTE ONLY): Good ELOS: 7-10 days   Today's Date: 04/10/2014 OT Individual Time: 1400-1500 OT Individual Time Calculation (min): 60 min     Problem List:  Patient Active Problem List   Diagnosis Date Noted  . Physical debility 04/09/2014  . Left hemiparesis 04/09/2014  . Syncope and collapse 03/30/2014  . VT (ventricular tachycardia) 03/30/2014  . ICD discharge 03/30/2014  . Dyslexia 02/27/2014  . DNR (do not resuscitate) 02/22/2014  . Palliative care encounter 02/20/2014  . Shortness of breath 02/20/2014  . Sleep disturbance 02/20/2014  . Acute on chronic renal failure   . Cardiogenic shock   . Acute on chronic systolic CHF (congestive heart failure) 02/14/2014  . Noncompliance 05/02/2013  . Encounter for therapeutic drug monitoring 04/12/2013  . NSVT- Amiodarone added Feb 2016 02/28/2013  . Hypothyroid-elevated TSH on Amiodarone 02/27/2013  . Chronic anticoagulation 02/27/2013  . PAF-RFA  01/07/13 02/02/2013  . Acute on chronic clinical systolic heart failure 13/24/4010  . CKD (chronic kidney disease), stage III 08/09/2012  . 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 Non-ischemic cardiomyopathy   . Chronic systolic heart failure   . Morbid obesity-BMI43   . Biventricular implantable cardioverter-defibrillator in situ 06/26/2009  . Hypertensive heart disease     Past Medical History:  Past Medical History  Diagnosis Date  . Non-ischemic cardiomyopathy     a. echo 11/10: EF 15%;   b. cath 10/08: normal cors;  c. Myoview 5/12: Very mild distal ant and apical isch, EF 17% (low risk-medical Rx cont'd);  d.   Echo 4/14:  EF 15%;  e. 05/2009 s/p SJM BiV ICD;  f. 07/2012 TEE: EF 20-25%, diff HK mild MR, mod TR.   Marland Kitchen Atrial fibrillation     a. on coumadin;  b. 07/2012 s/p TEE/DCCV. c. Recurrence 08/2012 in setting of abnormal thyroid panel.; 12/2012 AVN ablation by Dr Lovena Le  . Hypertension   . Obesity   . Non-compliance   . Dyslexia     unable to read.  Marland Kitchen History of CVA (cerebrovascular accident) 11/2008    a. 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 RBBB (right bundle branch block)   . Cough syncope     a. During 08/2012 adm.  . Systolic CHF, chronic 02/24/2534  . Hypothyroidism 01/19/1996    s/p RAI therapy  . Stroke 01/19/2008  . Renal insufficiency    Past Surgical History:  Past Surgical History  Procedure Laterality Date  . Bi-ventricular implantable cardioverter defibrillator  (crt-d)  06/11/09    [place] UNIFY [id number] BIVENTRICULAR AICD SERIAL #[id number]  . Knee arthroscopy Left ~ 1995  . Cardiac catheterization      "several time" (09/01/2012)  . Tee without cardioversion N/A 06/23/2012    Procedure: TRANSESOPHAGEAL ECHOCARDIOGRAM (TEE);  Surgeon: Thayer Headings, MD;  Location: [facility] ENDOSCOPY;  Service: Cardiovascular;  Laterality: N/A;  TEE will be done at 0730   . Cardioversion N/A 07/27/2012    Procedure: CARDIOVERSION;  Surgeon: Thayer Headings, MD;  Location: [facility] ENDOSCOPY;  Service: Cardiovascular;  Laterality: N/A;  . Tee without cardioversion N/A 08/10/2012    Procedure: TRANSESOPHAGEAL ECHOCARDIOGRAM (TEE);  Surgeon: Josue Hector, MD;  Location: [facility] ENDOSCOPY;  Service: Cardiovascular;  Laterality: N/A;  . Cardioversion N/A 08/10/2012    Procedure: CARDIOVERSION;  Surgeon: Josue Hector, MD;  Location: [place];  Service: Cardiovascular;  Laterality: N/A;  . Sp pta add intra cran  11/2008    a. 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 Ablation  01/09/2013    AVN ablation by Dr Lovena Le  . Av node ablation N/A 01/09/2013    Procedure:  AV NODE ABLATION;  Surgeon: Evans Lance, MD;  Location: [facility] CATH LAB;  Service: Cardiovascular;  Laterality: N/A;  . Right heart catheterization N/A 02/15/2014    Procedure: RIGHT HEART CATH;  Surgeon: Jolaine Artist, MD;  Location: [facility] CATH LAB;  Service: Cardiovascular;  Laterality: N/A;    Assessment & Plan Clinical Impression: Raymond [name] is a 54 y.o. 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 CXR with evidence of fluid overload, K+ at low normal levels and patient reported weight up by 4 lbs. Past admission, he was shocked for VT on the evening of 3/12 and again on the evening of 3/13. He was started on IV amiodarone and magnesium/potassium supplemented.  He was evaluated by Dr Caryl Comes and started on ranolazine and has been transitioned back to po amiodarone. He was diuresed with milrinone and lasix for acute on chronic systolic CHF with good results. Co-ox levels elevated today and recheck pending. He has had foot pain due to cellulitis and was started on doxycyline for treatment. Therapy initiated and patient noted to be significantly deconditioned and has been limited by right foot pain as well as left hemiparesis due to prior CVA. Patient transferred to CIR on 04/09/2014 .     Patient currently requires mod with basic self-care skills secondary to muscle weakness and muscle paralysis, decreased cardiorespiratoy endurance and abnormal tone, unbalanced muscle activation, decreased coordination and decreased motor planning.  Prior to hospitalization, patient could complete ADLs with min.  Patient will benefit from skilled intervention to decrease level of assist with basic self-care skills and increase independence with basic self-care skills prior to discharge  home independently.  Anticipate patient will require minimal physical assistance and follow up home health.  OT - End of Session Activity Tolerance: Tolerates < 10 min activity, no significant change in vital signs Endurance Deficit: Yes Endurance Deficit Description: cardiorespiratory deficit OT Assessment Rehab Potential (ACUTE ONLY): Good Barriers to Discharge: Inaccessible home environment;Decreased caregiver support Barriers to Discharge Comments: Pt unable to access bathroom at w/c level OT Patient demonstrates impairments in the following area(s): Balance;Behavior;Edema;Endurance;Motor;Pain;Perception;Safety;Sensory OT Basic ADL's Functional Problem(s): Bathing;Dressing;Toileting OT Transfers Functional Problem(s): Toilet;Tub/Shower OT Additional Impairment(s): Fuctional Use of Upper Extremity OT Plan OT Intensity: Minimum of 1-2 x/day, 45 to 90 minutes OT Frequency: 5 out of 7 days OT Duration/Estimated Length of Stay: 7-10 days OT Treatment/Interventions: Balance/vestibular training;Discharge planning;DME/adaptive equipment instruction;Functional mobility training;Pain management;Patient/family education;Psychosocial support;Self Care/advanced ADL retraining;Therapeutic Activities;Therapeutic Exercise;UE/LE Strength taining/ROM;UE/LE Coordination activities OT Self Feeding Anticipated Outcome(s): Mod I OT Basic Self-Care Anticipated Outcome(s): Min A OT Toileting Anticipated Outcome(s): Supervision OT Bathroom Transfers Anticipated Outcome(s): Supervision OT Recommendation Recommendations for Other Services: Neuropsych consult Patient destination: Home Follow Up Recommendations: Home health OT Equipment Recommended: To be determined   Skilled Therapeutic Intervention  Pt seen for OT Eval and ADL bathing and dressing task. Pt in supine upon arrival, voicing fatigue but agreeable to session. Pt's BP in supine was 107/74. He transferred to EOB with HOB slightly elevated and use of  SPC for stability with supervision. Pt's BP sitting EOB was 110/66. Pt voiced feeling slightly light headed throughout session, however, never felt need to lie down and deep breathing techniques taught for relaxation. Pt reports that he feels light headed when fatigued and agitated.  Pt completed bathing task seated EOB, demonstrating good functional sitting balance and endurance. Pt required steadying assist of L UE when utilizing R UE to wash L armpit, he required hand over hand assist to wash R UE. Pt layed supine in bed to wash abdomen and perform pericare. Pt educated on lateral leans to complete buttock hygiene and did so with success. Assist provided for thoroughness. Pt required assist to wash B feet due to decreased ROM to reach to access feet. Pt tolerated unsupported sitting for ~45 minutes, however, requires rest breaks throughout session due to decreased functional endurance.  Pt fearful of OOB activity due to increased pain in R ankle and stating he was too fatigued, that "you all have just worn me out with therapy, I will do better in the morning".   At end of session, pt left sitting EOB with PT present.   OT Evaluation Precautions/Restrictions  Precautions Precautions: Fall;ICD/Pacemaker;Other (comment) Precaution Comments: pacemaker placed 5 years ago; L side hemiplegia: L arm weaker than L leg Restrictions Weight Bearing Restrictions: No General Chart Reviewed: Yes Additional Pertinent History: Pt with L hemiplegia from stroke 5 years ago Vital Signs Therapy Vitals Pulse Rate: 70 BP: 107/74 mmHg Patient Position (if appropriate): Lying Oxygen Therapy SpO2: 95 % O2 Device: Not Delivered Pain Pain Assessment Pain Score: 3  Pain Location: Ankle Pain Orientation: Right Pain Descriptors / Indicators: Constant;Discomfort Pain Intervention(s): Repositioned;Ambulation/increased activity Home Living/Prior Functioning Home Living Available Help at Discharge: Family, Available  PRN/intermittently Type of Home: House Home Access: Stairs to enter [employer] of Steps: 1 Entrance Stairs-Rails: None Home Layout: Two level, Able to live on main level with bedroom/bathroom Alternate Level Stairs-Number of Steps: one flight upstairs Alternate Level Stairs-Rails: Can reach both Additional Comments: hand held shower head, but has had a PICC line x 2 months for heart medication  Lives With: Alone Prior Function Level of Independence: Requires assistive device for independence  Able to Take Stairs?: Yes (one step) Driving: Yes Vocation: On disability Comments: limited driving (1 mile to store & back), ambulates with a straight cane Vision/Perception  Vision- History Baseline Vision/History: Wears glasses Wears Glasses: Reading only Patient Visual Report: No change from baseline Vision- Assessment Vision Assessment?: No apparent visual deficits  Cognition Overall Cognitive Status: Within Functional Limits for tasks assessed Arousal/Alertness: Awake/alert Orientation Level: Oriented X4 Attention: Focused Focused Attention: Appears intact Memory: Impaired Memory Impairment: Decreased recall of new information;Decreased short term memory Decreased Short Term Memory: Verbal basic Awareness: Impaired Awareness Impairment: Intellectual impairment Problem Solving: Impaired Problem Solving Impairment: Functional complex;Verbal complex Behaviors: Poor frustration tolerance Safety/Judgment: Impaired Comments: Pt with decrease awareness of need for assist. He has much more functional physical apabilities than he believes Sensation Sensation Light Touch: Impaired Detail Light Touch Impaired Details: Impaired LLE;Impaired LUE;Impaired RLE Stereognosis: Not tested Hot/Cold: Not tested Proprioception: Appears Intact Coordination Gross Motor Movements are Fluid and Coordinated: No Fine Motor Movements are Fluid and Coordinated: No Coordination and Movement  Description: L UE moves in  flexor synergy Finger Nose Finger Test: Able to complete with increased time; decreased accuracy on L side Motor  Motor Motor: Hemiplegia;Abnormal tone;Primitive reflexes present;Ataxia Motor - Skilled Clinical Observations: Pt with synergy pattern in L UE Mobility  Bed Mobility Bed Mobility: Supine to Sit Supine to Sit: 5: Supervision;HOB elevated;Other (comment) (with SPC) Supine to Sit Details: Verbal cues for technique  Trunk/Postural Assessment  Cervical Assessment Cervical Assessment: Within Functional Limits Thoracic Assessment Thoracic Assessment: Within Functional Limits Lumbar Assessment Lumbar Assessment: Within Functional Limits Postural Control Postural Control: Within Functional Limits  Balance Balance Balance Assessed: Yes Static Sitting Balance Static Sitting - Balance Support: Feet supported Static Sitting - Level of Assistance: 5: Stand by assistance Static Sitting - Comment/# of Minutes: ~45 minutes of unsupported sitting on EOB for bathing task.  Dynamic Sitting Balance Dynamic Sitting - Balance Support: Left upper extremity supported;Right upper extremity supported;Feet supported;During functional activity Dynamic Sitting - Level of Assistance: 5: Stand by assistance Dynamic Sitting Balance - Compensations: Alternating B UEs for support Dynamic Sitting - Balance Activities: Lateral lean/weight shifting;Reaching for objects;Reaching across midline Sitting balance - Comments: Requires supported rest breaks throughout due to decreased functional endurance.  Extremity/Trunk Assessment RUE Assessment RUE Assessment: Within Functional Limits LUE Assessment LUE Assessment: Exceptions to WFL LUE AROM (degrees) Overall AROM Left Upper Extremity: Deficits;Due to premorbid status LUE Overall AROM Comments: Pt able to achieve full ROM, however, is unable to maintain position against gravity due to L hemiplegia.  LUE PROM (degrees) Overall  PROM Left Upper Extremity: Within functional limits for tasks assessed LUE Strength LUE Overall Strength: Deficits;Due to premorbid status LUE Overall Strength Comments: grossly 4+/5 at shoulder; 3+/5 at elbow; all digits unable to be fully extended.  LUE Tone LUE Tone: Modified Ashworth Modified Ashworth Scale for Grading Hypertonia LUE: Slight increase in muscle tone, manifested by a catch, followed by minimal resistance throughout the remainder (less than half) of the ROM LUE Tone Comments: L arm and L leg  FIM:  FIM - Grooming Grooming Steps: Wash, rinse, dry face;Wash, rinse, dry hands Grooming: 5: Set-up assist to obtain items FIM - Bathing Bathing Steps Patient Completed: Chest;Left Arm;Abdomen;Front perineal area;Right upper leg;Left upper leg Bathing: 3: Mod-Patient completes 5-7 [id number] parts or 50-74% FIM - Upper Body Dressing/Undressing Upper body dressing/undressing: 0: Wears gown/pajamas-no public clothing FIM - Lower Body Dressing/Undressing Lower body dressing/undressing steps patient completed: Don/Doff right shoe;Don/Doff left shoe Lower body dressing/undressing: 0: Wears gInterior and spatial designerFIM - BControl and instrumentation engineerDevices: Cane;HOB elevated Bed/Chair Transfer: 5: Supine > Sit: Supervision (verbal cues/safety issues)   Refer to Care Plan for Long Term Goals  Recommendations for other services: Neuropsych  Discharge Criteria: Patient will be discharged from OT if patient refuses treatment 3 consecutive times without medical reason, if treatment goals not met, if there is a change in medical status, if patient makes no progress towards goals or if patient is discharged from hospital.  The above assessment, treatment plan, treatment alternatives and goals were discussed and mutually agreed upon: by patient  LErnestina Patches3/23/2016, 4:21 PM

## 2014-04-11 ENCOUNTER — Inpatient Hospital Stay (HOSPITAL_COMMUNITY): Payer: Medicare Other | Admitting: Physical Therapy

## 2014-04-11 ENCOUNTER — Inpatient Hospital Stay (HOSPITAL_COMMUNITY): Payer: Medicare Other

## 2014-04-11 LAB — URIC ACID: Uric Acid, Serum: 9.4 mg/dL — ABNORMAL HIGH (ref 4.0–7.8)

## 2014-04-11 LAB — CBC
HEMATOCRIT: 38.9 % — AB (ref 39.0–52.0)
Hemoglobin: 12.7 g/dL — ABNORMAL LOW (ref 13.0–17.0)
MCH: 30.2 pg (ref 26.0–34.0)
MCHC: 32.6 g/dL (ref 30.0–36.0)
MCV: 92.4 fL (ref 78.0–100.0)
PLATELETS: 359 10*3/uL (ref 150–400)
RBC: 4.21 MIL/uL — AB (ref 4.22–5.81)
RDW: 15.8 % — ABNORMAL HIGH (ref 11.5–15.5)
WBC: 9 10*3/uL (ref 4.0–10.5)

## 2014-04-11 LAB — BASIC METABOLIC PANEL
ANION GAP: 10 (ref 5–15)
BUN: 30 mg/dL — ABNORMAL HIGH (ref 6–23)
CHLORIDE: 96 mmol/L (ref 96–112)
CO2: 27 mmol/L (ref 19–32)
Calcium: 9.1 mg/dL (ref 8.4–10.5)
Creatinine, Ser: 1.86 mg/dL — ABNORMAL HIGH (ref 0.50–1.35)
GFR calc Af Amer: 47 mL/min — ABNORMAL LOW (ref >90)
GFR calc non Af Amer: 40 mL/min — ABNORMAL LOW (ref >90)
Glucose, Bld: 124 mg/dL — ABNORMAL HIGH (ref 70–99)
Potassium: 4.1 mmol/L (ref 3.5–5.1)
Sodium: 133 mmol/L — ABNORMAL LOW (ref 135–145)

## 2014-04-11 LAB — PROTIME-INR
INR: 3.18 — ABNORMAL HIGH (ref 0.00–1.49)
PROTHROMBIN TIME: 32.9 s — AB (ref 11.6–15.2)

## 2014-04-11 MED ORDER — SORBITOL 70 % SOLN
30.0000 mL | Freq: Once | Status: AC
  Administered 2014-04-11: 30 mL via ORAL
  Filled 2014-04-11: qty 30

## 2014-04-11 MED ORDER — ACYCLOVIR 200 MG PO CAPS
400.0000 mg | ORAL_CAPSULE | Freq: Every morning | ORAL | Status: DC
Start: 2014-04-11 — End: 2014-04-20
  Administered 2014-04-11 – 2014-04-20 (×10): 400 mg via ORAL
  Filled 2014-04-11 (×11): qty 2

## 2014-04-11 MED ORDER — WARFARIN SODIUM 1 MG PO TABS
1.0000 mg | ORAL_TABLET | Freq: Once | ORAL | Status: AC
Start: 2014-04-11 — End: 2014-04-11
  Administered 2014-04-11: 1 mg via ORAL
  Filled 2014-04-11 (×2): qty 1

## 2014-04-11 MED ORDER — SENNOSIDES-DOCUSATE SODIUM 8.6-50 MG PO TABS
2.0000 | ORAL_TABLET | Freq: Every day | ORAL | Status: DC
  Administered 2014-04-11 – 2014-04-19 (×9): 2 via ORAL
  Filled 2014-04-11 (×11): qty 2

## 2014-04-11 NOTE — Progress Notes (Signed)
Occupational Therapy Session Note  Patient Details  Name: Justen Casalino MRN: [id number] Date of Birth: 1960/04/26  Today's Date: 04/11/2014 OT Individual Time: 0045-9977 OT Individual Time Calculation (min): 60 min    Short Term Goals: Week 1:  OT Short Term Goal 1 (Week 1): STG=LTG due to short length of stay.   Skilled Therapeutic Interventions/Progress Updates: ADL-retraining with focus on improved performance of assisted bathing, dynamic sitting balance, and pain management of RLE.   Pt received supine in bed reported continued right ankle pain but able to process instructions and accept planned activity.   With setup assist pt sat at edge of bed and completed bathing with overall min assist to wash buttocks and both feet.   Pt reports that he had not showered at home d/t pic line placement.   Pt added that he has a home health aid that "washes everything" although he reports ability to dress himself without assist.   With setup and min assist only to reach lower legs and buttocks, pt completed pan bath at edge of bed.   No clothing available this session for dressing.    Pt finished bathing and accepted use of ace wrap at right ankle to facilitate SPT without pain to w/c.   Pt then groomed at sink, deferring to shave at this time.   Pt returned to bedside in w/c at end of session to rest while awaiting physical therapist.   Call light and phone placed within reach.        Therapy Documentation Precautions:  Precautions Precautions: Fall, ICD/Pacemaker, Other (comment) Precaution Comments: pacemaker placed 5 years ago; L side hemiplegia: L arm weaker than L leg Restrictions Weight Bearing Restrictions: No  Vital Signs: Therapy Vitals Pulse Rate: 70 BP: 118/73 mmHg Patient Position (if appropriate): Sitting Oxygen Therapy SpO2: 97 % O2 Device: Not Delivered  Pain: Pain Assessment Pain Assessment: No/denies pain  See FIM for current functional status  Therapy/Group:  Individual Therapy   Second session: Time: 1300-1400 Time Calculation (min):  60 min  Pain Assessment: No/denies  Skilled Therapeutic Interventions: ADL-retraining (45 min) with focus on bed mobility, transfers (to/from [facility]), toileting, and safety awareness.   Pt received supine in bed, aware of planned session although reluctant to participate d/t incontinence of bowel.  Pt reported incontinent episode recently with need for assist with clean-up and return to bed.  Pt was unsure if his bowels were empty and he requested to remain in bed while awaiting cessation of symptoms of bowel pressure.   OT advised transfer to [facility] to enhance voiding.  Pt completed bed mobility using bed rails and HOB elevated and performed SPT to [facility] with overall min assist.   After voiding medium loose stool, pt stood for hygiene but required total assist to wipe d/t small BSC.   OT educated pt on drop-arm [facility] however pt reports that he stands supported at sink at his home to completed hygiene at his home.   Pt returned to bed using similar transfer and accepted use of brief to reduce effect of incontinence.   Following return to bed pt was issued thera-band and was educated on modified UE strengthening program.   Pt then completed 2 exercises with hand guidance and manual facilitation to provide appropriate resistance to LUE.   See FIM for current functional status  Therapy/Group: Individual Therapy  Donzetta Kohut 04/11/2014, 2:08 PM

## 2014-04-11 NOTE — Progress Notes (Signed)
ANTICOAGULATION CONSULT NOTE - Follow Up Consult  Pharmacy Consult for coumadin Indication: atrial fibrillation  Allergies  Allergen Reactions  . Valsartan Cough    Patient Measurements: Height: 5\' 9"  (175.3 cm) Weight: 264 lb 12.4 oz (120.1 kg) IBW/kg (Calculated) : 70.7  Vital Signs: Temp: 98.4 F (36.9 C) (03/24 0459) Temp Source: Oral (03/24 0459) BP: 118/73 mmHg (03/24 1009) Pulse Rate: 70 (03/24 1009)  Labs:  Recent Labs  04/09/14 0508 04/10/14 0557 04/11/14 0545  HGB 13.4 13.0 12.7*  HCT 41.4 39.7 38.9*  PLT 347 340 359  LABPROT 36.4* 37.9* 32.9*  INR 3.62* 3.82* 3.18*  CREATININE 1.57* 1.83* 1.86*    Estimated Creatinine Clearance: 60.1 mL/min (by C-G formula based on Cr of 1.86).   Medications:  Scheduled:  . acyclovir  400 mg Oral q morning - 10a  . amiodarone  200 mg Oral BID  . cyclobenzaprine  10 mg Oral BID  . doxycycline  100 mg Oral Q12H  . levothyroxine  250 mcg Oral QAC breakfast  . magnesium oxide  400 mg Oral Daily  . potassium chloride  40 mEq Oral BID  . ranolazine  1,000 mg Oral BID  . senna-docusate  2 tablet Oral QHS  . spironolactone  25 mg Oral BID  . torsemide  80 mg Oral BID  . Warfarin - Pharmacist Dosing Inpatient   Does not apply q1800   Infusions:  . milrinone 0.375 mcg/kg/min (04/11/14 2707)    Assessment: 54 yo [name] with hx of CVA and afib is currently on supratherapeutic coumadin.  INR today is 3.82 > 3.18 no coumadin since 3/20. On amiodarone and doxycycline. CBC stable  Goal of Therapy:  INR 2-3 Monitor platelets by anticoagulation protocol: Yes   Plan:  Coumadin 1mg  po x 1 INR in am  Bayard Hugger, PharmD, BCPS  Clinical Pharmacist  Pager: [phone]   04/11/2014,1:20 PM

## 2014-04-11 NOTE — Progress Notes (Signed)
Physical Therapy Session Note  Patient Details  Name: Raymond [name] MRN: [id number] Date of Birth: Sep 03, 1960  Today's Date: 04/11/2014 PT Individual Time: 1000-1100 PT Individual Time Calculation (min): 60 min   Short Term Goals: Week 1:  PT Short Term Goal 1 (Week 1): STGs = LTGs due to ELOS  Skilled Therapeutic Interventions/Progress Updates:    Gait Training: PT instructs pt in ambulation with SPC x 30' req min A for balance and 2nd assist for w/c follow and IV pole management.  PT instructs pt in ascending/descending on a 2" step with SPC req min A - pt reports R ankle feels like it's giving way, so PT instructed pt to sit into w/c for rest break.   Therapeutic Activity: Pt reports he needs to urinate. PT brings pt his urinal and instructs pt to "do your best" in managing this urinal and pt demonstrates ability to hold urinal in place and empty bladder with set up assist.  PT instructs pt to wheel up to sink in w/c and pt req set up assist to wash hands (cannot reach soap, so PT brings soap close to pt).   W/C management: PT instructs pt in self propulsion with R UE and R LE x 50' req SBA and increased time, as well as verbal cues to use R foot stroke in a diagonal pattern to assist with steering.   Pt is making slow progress. Pt limited by no "doorknob" on L side of simulated stairs to hold onto in order to practice ascending 2 stairs to get into home. Pt has a low (~2" height) step and a standard step to enter home - PT made a simulation of this using low step and curb step. It would be beneficial to set this up against the wall so pt can use rail on L side to simulate the doorknob that he has at home. Premorbidly, pt used SPC and doorknob to ascend, and quad cane and doorknob to descend. Continue per PT POC.   Therapy Documentation Precautions:  Precautions Precautions: Fall, ICD/Pacemaker, Other (comment) Precaution Comments: pacemaker placed 5 years ago; L side hemiplegia: L  arm weaker than L leg Restrictions Weight Bearing Restrictions: No Vital Signs: Therapy Vitals Pulse Rate: 70 BP: 118/73 mmHg Patient Position (if appropriate): Sitting Oxygen Therapy SpO2: 97 % O2 Device: Not Delivered Pain: Pain Assessment Pain Assessment: No/denies pain  See FIM for current functional status  Therapy/Group: Individual Therapy  [name] M 04/11/2014, 10:12 AM

## 2014-04-11 NOTE — Progress Notes (Signed)
[facility] PHYSICAL MEDICINE & REHABILITATION     PROGRESS NOTE    Subjective/Complaints: Right ankle sore. Complains of pain with defecation /constipation  Objective: Vital Signs: Blood pressure 103/67, pulse 69, temperature 98.4 F (36.9 C), temperature source Oral, resp. rate 19, height 5\' 9"  (1.753 m), weight 120.1 kg (264 lb 12.4 oz), SpO2 95 %. No results found.  Recent Labs  04/10/14 0557 04/11/14 0545  WBC 11.8* 9.0  HGB 13.0 12.7*  HCT 39.7 38.9*  PLT 340 359    Recent Labs  04/10/14 0557 04/11/14 0545  NA 135 133*  K 4.6 4.1  CL 99 96  GLUCOSE 131* 124*  BUN 26* 30*  CREATININE 1.83* 1.86*  CALCIUM 9.3 9.1   CBG (last 3)   Recent Labs  04/08/14 1652  GLUCAP 123*    Wt Readings from Last 3 Encounters:  04/11/14 120.1 kg (264 lb 12.4 oz)  04/09/14 120.611 kg (265 lb 14.4 oz)  03/27/14 122.925 kg (271 lb)    Physical Exam:  Constitutional: He is oriented to person, place, and time. He appears well-developed and well-nourished.  HENT: dentition fair. Mucosa moist Head: Normocephalic and atraumatic.  Eyes: Conjunctivae and EOM are normal. Pupils are equal, round, and reactive to light.  Neck: No tracheal deviation present. No thyromegaly present.  Cardiovascular: Normal rate and regular rhythm. Exam reveals no friction rub.  No murmur heard. Respiratory: Effort normal and breath sounds normal. No respiratory distress. He has no wheezes.  GI: Soft. Bowel sounds are normal. He exhibits no distension. There is no tenderness.  Musculoskeletal: He exhibits no edema or tenderness.  Right foot with erythema at heel and laterally. No pain with ROM. mild pain with palpation over the mid foot. Neurological: He is alert and oriented to person, place, and time.  Slow measured speech. He was able to follow basic commands without difficulty. Left hemiparesis UE> LE with sensory deficits. LUE 2+ to 3-/5. LLE: 3/5 HF, 4-KE and 2+ aDF, 3+apf, RUE and RLE  4 to 4+/5, some limitations with movement of right foot. Mild left facial droop. Speech clear.  Skin: Skin is warm and dry. Some redness/warmth at right ankle. Marks on feet from crossing legs Psychiatric: His mood appears is fairly appropriate.  His speech is delayed. He is slowed.    Assessment/Plan: 1. Functional deficits secondary to debility after chf/cellulitis which require 3+ hours per day of interdisciplinary therapy in a comprehensive inpatient rehab setting. Physiatrist is providing close team supervision and 24 hour management of active medical problems listed below. Physiatrist and rehab team continue to assess barriers to discharge/monitor patient progress toward functional and medical goals. FIM: FIM - Bathing Bathing Steps Patient Completed: Chest, Left Arm, Abdomen, Front perineal area, Right upper leg, Left upper leg Bathing: 3: Mod-Patient completes 5-7 26f 10 parts or 50-74%  FIM - Upper Body Dressing/Undressing Upper body dressing/undressing: 0: Wears gown/pajamas-no public clothing FIM - Lower Body Dressing/Undressing Lower body dressing/undressing steps patient completed: Don/Doff right shoe, Don/Doff left shoe Lower body dressing/undressing: 0: Wears Oceanographer        FIM - Banker Devices: Cane, HOB elevated Bed/Chair Transfer: 5: Supine > Sit: Supervision (verbal cues/safety issues)  FIM - Locomotion: Wheelchair Distance: 25 Locomotion: Wheelchair: 1: Travels less than 50 ft with minimal assistance (Pt.>75%) FIM - Locomotion: Ambulation Locomotion: Ambulation: 0: Activity did not occur (pt refused)  Comprehension Comprehension Mode: Auditory Comprehension: 6-Follows complex conversation/direction: With extra time/assistive device  Expression  Expression Mode: Verbal Expression: 6-Expresses complex ideas: With extra time/assistive device  Social Interaction Social Interaction: 5-Interacts  appropriately 90% of the time - Needs monitoring or encouragement for participation or interaction.  Problem Solving Problem Solving: 5-Solves basic 90% of the time/requires cueing < 10% of the time  Memory Memory: 4-Recognizes or recalls 75 - 89% of the time/requires cueing 10 - 24% of the time Medical Problem List and Plan: 1. Functional deficits secondary to debility after multiple medical issues/chf. Right foot cellulitis 2. DVT Prophylaxis/Anticoagulation: Pharmaceutical: Coumadin 3. Pain Management: will continue flexeril and oxycodone prn for pain.  4. Anxiety/Mood: continue xanax prn. Team to provide ego support and encouragement. LCSW to follow for evaluation and support.  5. Neuropsych: This patient is capable of making decisions on his own behalf. 6. Skin/Wound Care: Routine pressure relief measures. Maintain adequate nutritional status. Offer supplements  7. Fluids/Electrolytes/Nutrition: Needs to limit water/ice intake to avoid overload and hyponatremia.  8. 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 On home milrinone as 0.375, demadex 80 mg bid, spironolactone 25 mg bid. Not a good cadidate for advance therapies per cardiology. 119kg 9. VT/NSVT: Hypokalemia resolved. To continue ranolazine, amiodarone bid, Kdur 60 mg bid and mag ox daily. Monitor lytes for stability.  10. A fib: Heart rate controlled. On warfarin.  11. H/o CVA with left hemiparesis 12. CKD: Monitor lytes daily. Renal status at baseline 13. Leukocytosis: some improvement today  -no clinical signs of infection  -remains on doxycycline 14. Right ankle pain: ?OA vs gout vs cellulitis  -ice  -will try to add UA level to labs from this morning   . LOS (Days) 2 A FACE TO FACE EVALUATION WAS PERFORMED  Anita Mcadory T 04/11/2014 8:04 AM

## 2014-04-12 ENCOUNTER — Inpatient Hospital Stay (HOSPITAL_COMMUNITY): Payer: Medicare Other

## 2014-04-12 LAB — BASIC METABOLIC PANEL
Anion gap: 5 (ref 5–15)
BUN: 27 mg/dL — ABNORMAL HIGH (ref 6–23)
CALCIUM: 8.9 mg/dL (ref 8.4–10.5)
CO2: 27 mmol/L (ref 19–32)
Chloride: 100 mmol/L (ref 96–112)
Creatinine, Ser: 1.75 mg/dL — ABNORMAL HIGH (ref 0.50–1.35)
GFR calc Af Amer: 50 mL/min — ABNORMAL LOW (ref >90)
GFR calc non Af Amer: 43 mL/min — ABNORMAL LOW (ref >90)
Glucose, Bld: 112 mg/dL — ABNORMAL HIGH (ref 70–99)
Potassium: 5.1 mmol/L (ref 3.5–5.1)
SODIUM: 132 mmol/L — AB (ref 135–145)

## 2014-04-12 LAB — PROTIME-INR
INR: 2.61 — AB (ref 0.00–1.49)
Prothrombin Time: 28.1 seconds — ABNORMAL HIGH (ref 11.6–15.2)

## 2014-04-12 MED ORDER — PREDNISONE 10 MG PO TABS
10.0000 mg | ORAL_TABLET | Freq: Two times a day (BID) | ORAL | Status: AC
  Administered 2014-04-12 (×2): 10 mg via ORAL
  Filled 2014-04-12 (×3): qty 1

## 2014-04-12 MED ORDER — WARFARIN SODIUM 3 MG PO TABS
3.0000 mg | ORAL_TABLET | Freq: Once | ORAL | Status: AC
  Administered 2014-04-12: 3 mg via ORAL
  Filled 2014-04-12: qty 1

## 2014-04-12 MED ORDER — MILRINONE IN DEXTROSE 20 MG/100ML IV SOLN
0.3750 ug/kg/min | INTRAVENOUS | Status: DC
  Administered 2014-04-12 – 2014-04-20 (×28): 0.375 ug/kg/min via INTRAVENOUS
  Filled 2014-04-12 (×28): qty 100

## 2014-04-12 MED ORDER — POTASSIUM CHLORIDE CRYS ER 20 MEQ PO TBCR
20.0000 meq | EXTENDED_RELEASE_TABLET | Freq: Every day | ORAL | Status: DC
  Administered 2014-04-13 – 2014-04-15 (×3): 20 meq via ORAL
  Filled 2014-04-12 (×4): qty 1

## 2014-04-12 NOTE — Progress Notes (Signed)
Occupational Therapy Session Note  Patient Details  Name: Raymond [name] MRN: [id number] Date of Birth: [birth year]  Today's Date: 04/12/2014 OT Individual Time: 0930-1030 OT Individual Time Calculation (min): 60 min    Short Term Goals: Week 1:  OT Short Term Goal 1 (Week 1): STG=LTG due to short length of stay.   Skilled Therapeutic Interventions/Progress Updates: ADL-retraining with focus on improved dynamic standing balance, endurance, and pain management.   With setup assist pt completed bathing at sink unassisted, standing with only steadying assist to wash buttocks and peri-area.   Pt is still without clothing and dressed in gown with min assist to manage IV line and fasten snaps.    Pt completed session grooming at sink and elected to shave beard with mod assist for thoroughness required.   Pt left at sink in w/c at end of session as family arrived from [place] for visit.    Therapy Documentation Precautions:  Precautions Precautions: Fall, ICD/Pacemaker, Other (comment) Precaution Comments: pacemaker placed 5 years ago; L side hemiplegia: L arm weaker than L leg Restrictions Weight Bearing Restrictions: No  Vital Signs: Therapy Vitals Temp: 98.5 F (36.9 C) Pulse Rate: 70 Resp: 18 BP: 104/78 mmHg Oxygen Therapy SpO2: 95 % O2 Device: Not Delivered  Pain: 3/10, right ankle, acewrap    See FIM for current functional status  Therapy/Group: Individual Therapy  Raymond [name] 04/12/2014, 4:03 PM

## 2014-04-12 NOTE — Progress Notes (Signed)
Physical Therapy Session Note  Patient Details  Name: Raymond [name] MRN: [id number] Date of Birth: [birth year]  Today's Date: 04/12/2014 PT Individual Time: 0800-0900 PT Individual Time Calculation (min): 60 min     Short Term Goals: Week 1:  PT Short Term Goal 1 (Week 1): STGs = LTGs due to ELOS  Skilled Therapeutic Interventions/Progress Updates:    Session 1: Pt received supine in bed, agreeable to participate in therapy. Session focused on standing balance, gait training, LE strengthening. Instructed pt in transferring bed>w/c w/ RW and Min Guard A w/ stand pivot technique. Pt propelled w/c 25' w/ hemi-technique before fatiguing, pt transported remaining distance to rehab gym w/ TotalA for energy conservation. Worked on sit<>Stands and LE standing ex's in parallel bars, pt limited in upright mobility due to significant R ankle pain with any WBing. Pt overall MinA for sit>stands and ambulation in parallel bars, continue to recommend +2 assist for overground ambulation due to need for w/c follow. Session ended in pt's room, where pt was left seated in w/c w/ all needs within reach.   Therapy Documentation Precautions:  Precautions Precautions: Fall, ICD/Pacemaker, Other (comment) Precaution Comments: pacemaker placed 5 years ago; L side hemiplegia: L arm weaker than L leg Restrictions Weight Bearing Restrictions: No Pain:  Pain in R ankle with all WBing, pt allowed to rest frequently and changed treatment to emphasize open chain strengthening on R  See FIM for current functional status  Therapy/Group: Individual Therapy  Hosie Spangle  Hosie Spangle, PT, DPT 04/12/2014, 7:42 AM

## 2014-04-12 NOTE — Progress Notes (Signed)
Occupational Therapy Session Note  Patient Details  Name: Raymond [name] MRN: [id number] Date of Birth: 27-Sep-1960  Today's Date: 04/12/2014 OT Individual Time: 1320-1420 OT Individual Time Calculation (min): 60 min    Short Term Goals: Week 1:  OT Short Term Goal 1 (Week 1): STG=LTG due to short length of stay.   Skilled Therapeutic Interventions/Progress Updates:    Pt seen for 1:1 OT session with focus on functional transfer, safety awareness, dynamic standing balance, and activity tolerance. Pt received supine in bed agreeable to therapy with mod cues of encouragement. Completed stand pivot transfer bed>w/c with min A and use of SPC. Completed multiple stand pivot transfers bed<>w/c and w/c<>mat table with min A and mod-max cues for safety. Pt with LOB to right 1 time requiring max A to correct. Pt very impulsive during all transitional movements and required extensive education. Engaged in dynamic standing balance task of placing horseshoes on overhead target with min A for balance while reaching anteriorly and laterally with emphasis on trunk rotation. After 2 games, pt reporting dizziness. Attempted to take BP however unsuccessful. Dizziness not subsiding after increased time therefore returned to room. Upon room pt requesting to urinate. Pt stood with min A for balance and total A to manage urinal. Pt returned to bed and again unable to obtain BP. Pt reporting no dizziness with BLE elevated and supine in bed. Notified RN.   Therapy Documentation Precautions:  Precautions Precautions: Fall, ICD/Pacemaker, Other (comment) Precaution Comments: pacemaker placed 5 years ago; L side hemiplegia: L arm weaker than L leg Restrictions Weight Bearing Restrictions: No General:   Vital Signs:   Pain: No report of pain  See FIM for current functional status  Therapy/Group: Individual Therapy  Daneil Dan 04/12/2014, 2:31 PM

## 2014-04-12 NOTE — Progress Notes (Signed)
Social Work  Social Work Assessment and Plan  Patient Details  Name: Raymond [name] MRN: [id number] Date of Birth: April 17, 1960  Today's Date: 04/12/2014  Problem List:  Patient Active Problem List   Diagnosis Date Noted  . Physical debility 04/09/2014  . Left hemiparesis 04/09/2014  . Syncope and collapse 03/30/2014  . VT (ventricular tachycardia) 03/30/2014  . ICD discharge 03/30/2014  . Dyslexia 02/27/2014  . DNR (do not resuscitate) 02/22/2014  . Palliative care encounter 02/20/2014  . Shortness of breath 02/20/2014  . Sleep disturbance 02/20/2014  . Acute on chronic renal failure   . Cardiogenic shock   . Acute on chronic systolic CHF (congestive heart failure) 02/14/2014  . Noncompliance 05/02/2013  . Encounter for therapeutic drug monitoring 04/12/2013  . NSVT- Amiodarone added Feb 2016 02/28/2013  . Hypothyroid-elevated TSH on Amiodarone 02/27/2013  . Chronic anticoagulation 02/27/2013  . PAF-RFA  01/07/13 02/02/2013  . Acute on chronic clinical systolic heart failure 01/07/2013  . CKD (chronic kidney disease), stage III 08/09/2012  . 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 Non-ischemic cardiomyopathy   . Chronic systolic heart failure   . Morbid obesity-BMI43   . Biventricular implantable cardioverter-defibrillator in situ 06/26/2009  . Hypertensive heart disease    Past Medical History:  Past Medical History  Diagnosis Date  . Non-ischemic cardiomyopathy     a. echo 11/10: EF 15%;   b. cath 10/08: normal cors;  c. Myoview 5/12: Very mild distal ant and apical isch, EF 17% (low risk-medical Rx cont'd);  d.  Echo 4/14:  EF 15%;  e. 05/2009 s/p SJM BiV ICD;  f. 07/2012 TEE: EF 20-25%, diff HK mild MR, mod TR.   Marland Kitchen Atrial fibrillation     a. on coumadin;  b. 07/2012 s/p TEE/DCCV. c. Recurrence 08/2012 in setting of abnormal thyroid panel.; 12/2012 AVN ablation by Dr [name]  . Hypertension   . Obesity   . Non-compliance   .  Dyslexia     unable to read.  Marland Kitchen History of CVA (cerebrovascular accident) 11/2008    a. 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 RBBB (right bundle branch block)   . Cough syncope     a. During 08/2012 adm.  . Systolic CHF, chronic 01/19/1996  . Hypothyroidism 01/19/1996    s/p RAI therapy  . Stroke 01/19/2008  . Renal insufficiency    Past Surgical History:  Past Surgical History  Procedure Laterality Date  . Bi-ventricular implantable cardioverter defibrillator  (crt-d)  06/11/09    [facility] [id number] BIVENTRICULAR AICD SERIAL #[id number]  . Knee arthroscopy Left ~ 1995  . Cardiac catheterization      "several time" (09/01/2012)  . Tee without cardioversion N/A 06/23/2012    Procedure: TRANSESOPHAGEAL ECHOCARDIOGRAM (TEE);  Surgeon: Vesta Mixer, MD;  Location: [facility] ENDOSCOPY;  Service: Cardiovascular;  Laterality: N/A;  TEE will be done at 0730   . Cardioversion N/A 07/27/2012    Procedure: CARDIOVERSION;  Surgeon: Vesta Mixer, MD;  Location: [facility] ENDOSCOPY;  Service: Cardiovascular;  Laterality: N/A;  . Tee without cardioversion N/A 08/10/2012    Procedure: TRANSESOPHAGEAL ECHOCARDIOGRAM (TEE);  Surgeon: Wendall Stade, MD;  Location: [facility] ENDOSCOPY;  Service: Cardiovascular;  Laterality: N/A;  . Cardioversion N/A 08/10/2012    Procedure: CARDIOVERSION;  Surgeon: Wendall Stade, MD;  Location: [facility] ENDOSCOPY;  Service: Cardiovascular;  Laterality: N/A;  . Sp pta add intra cran  11/2008    a. 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 Ablation  01/09/2013    AVN ablation by Dr [name]  . Av node ablation N/A 01/09/2013    Procedure: AV NODE ABLATION;  Surgeon: Marinus Maw, MD;  Location: [facility] CATH LAB;  Service: Cardiovascular;  Laterality: N/A;  . Right heart catheterization N/A 02/15/2014    Procedure: RIGHT HEART CATH;  Surgeon: Dolores Patty, MD;  Location: [facility] CATH LAB;  Service: Cardiovascular;  Laterality: N/A;   Social History:   reports that he has never smoked. He has never used smokeless tobacco. He reports that he drinks alcohol. He reports that he does not use illicit drugs.  Family / Support Systems Marital Status: Divorced How Long?: 2 yrs Patient Roles: Partner Spouse/Significant Other: girlfriend, Sherral Hammers @ (C) [phone] (pt reports they have "been together" x 14 yrs Other Supports: mother, Ralene Ok [facility]) @ [phone] or (H) @ [phone] Anticipated Caregiver: Pt.'s mom does not assist pt. except for driving him to MD appointments.  Grirlfriend Sherral Hammers works Monday thru Friday and comes to be with pt. on the weekend.   Ability/Limitations of Caregiver: Per pt's mom , Ralene Ok, she cannot physically assist due to her own heart failure Caregiver Availability: Intermittent Family Dynamics: Mother and girlfriend supportive but limited in support they can provide.  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  As we moved through interview and nearing conclusion, pt began to talk more but mostly complaints about "the system" of healthcare.  Pt denies any s/s of emotional distress - will monitor Recent Psychosocial Issues: None Pyschiatric History: None Substance Abuse History: None  Patient / Family Perceptions, Expectations  & Goals Pt/Family understanding of illness & functional limitations: Pt with good understanding of his current debility due to CHF and cellulitis.  Good understanding of his functional limitations. Premorbid pt/family roles/activities: Pt was independent.  Drove short distances. Anticipated changes in roles/activities/participation: little change if able to reach mod i goals Pt/family expectations/goals: "I need to be able to do things for myself."  [employer]: Other (Comment) (Heart failure clinic) Premorbid Home Care/DME Agencies: Other (Comment) ([facility] for [facility] PTA) Transportation available at discharge: yes  Discharge Planning Living Arrangements: Alone Support Systems: Spouse/significant other, Parent, Friends/neighbors Type of Residence: Private residence Insurance Resources: [employer] Financial Resources: SSD, Family Support Financial Screen Referred: No Living Expenses: Psychologist, sport and exercise Management: Patient Does the patient have any problems obtaining your medications?: No Home Management: pt and girlfriend Patient/Family Preliminary Plans: Pt plans to return to his home with intermittent assist of gf and mother Social Work Anticipated Follow Up Needs: HH/OP Expected length of stay: 7 days  Clinical Impression Chronically ill gentleman here for deconditioning following flare of CHF and cellulities.  Goals set for mod i and plan for pt to return home alone.  Intermittent assist available from gf and mother (mostly weekends).  Pt denies any s/s  of emotional distress, however, will monitor.  [facility] already following pt for heart failure.  Will follow for d/c planning and support needs.  Berta Denson 04/12/2014, 9:07 AM

## 2014-04-12 NOTE — IPOC Note (Signed)
Overall Plan of Care [facility]) Patient Details Name: Oneil Melman MRN: [id number] DOB: 03-23-1960  Admitting Diagnosis: vfib tach chf  Hospital Problems: Principal Problem:   Physical debility Active Problems:   Non-ischemic cardiomyopathy   Chronic systolic heart failure   Left hemiparesis     Functional Problem List: Nursing Edema, Endurance, Medication Management, Nutrition, Perception, Skin Integrity, Sensory  PT Balance, Behavior, Edema, Endurance, Motor, Pain, Safety, Sensory  OT Balance, Behavior, Edema, Endurance, Motor, Pain, Perception, Safety, Sensory  SLP    TR Edema, Endurance, Motor, Pain, Safety, Skin Integrity       Basic ADL's: OT Bathing, Dressing, Toileting     Advanced  ADL's: OT       Transfers: PT Bed Mobility, Bed to Chair, Car, Occupational psychologist, Research scientist (life sciences): PT Ambulation, Psychologist, prison and probation services, Stairs     Additional Impairments: OT Fuctional Use of Upper Extremity  SLP        TR      Anticipated Outcomes Item Anticipated Outcome  Self Feeding Mod I  Swallowing      Basic self-care  Min A  Toileting  Supervision   Bathroom Transfers Supervision  Bowel/Bladder  Ciont of B+B with level 5 assist of staff  Transfers  Mod I with SPC  Locomotion  Mod I ambulation x 30'  Communication     Cognition     Pain  N/a  Chiropodist with cues/safety initiatives   Therapy Plan: PT Intensity: Minimum of 1-2 x/day ,45 to 90 minutes PT Frequency: 5 out of 7 days PT Duration Estimated Length of Stay: 7-10 days OT Intensity: Minimum of 1-2 x/day, 45 to 90 minutes OT Frequency: 5 out of 7 days OT Duration/Estimated Length of Stay: 7-10 days         Team Interventions: Nursing Interventions Patient/Family Education, Cognitive Remediation/Compensation, Discharge Planning, Disease Management/Prevention, Psychosocial Support, Medication Management  PT interventions Ambulation/gait training, Discharge  planning, Functional mobility training, Psychosocial support, Therapeutic Activities, Balance/vestibular training, Disease management/prevention, Neuromuscular re-education, Therapeutic Exercise, Wheelchair propulsion/positioning, Cognitive remediation/compensation, DME/adaptive equipment instruction, Pain management, Splinting/orthotics, UE/LE Strength taining/ROM, Firefighter, Equities trader education, Development worker, international aid stimulation, Museum/gallery curator, UE/LE Coordination activities  OT Interventions Warden/ranger, Discharge planning, DME/adaptive equipment instruction, Functional mobility training, Pain management, Patient/family education, Psychosocial support, Self Care/advanced ADL retraining, Therapeutic Activities, Therapeutic Exercise, UE/LE Strength taining/ROM, UE/LE Coordination activities  SLP Interventions    TR Interventions    SW/CM Interventions Discharge Planning, Psychosocial Support, Patient/Family Education    Team Discharge Planning: Destination: PT-Home ,OT- Home , SLP-  Projected Follow-up: PT-Home health PT, OT-  Home health OT, SLP-  Projected Equipment Needs: PT-To be determined, OT- To be determined, SLP-  Equipment Details: PT-Pt already owns a [facility] (for transfers and short distance ambulation) and QC (for use on step to enter/exit home); no w/c and no leg brace, OT-  Patient/family involved in discharge planning: PT- Patient, Family member/caregiver,  OT-Patient, SLP-   MD ELOS: 7-10 days Medical Rehab Prognosis:  Excellent Assessment: The patient has been admitted for CIR therapies with the diagnosis of debility after multiple medical. The team will be addressing functional mobility, strength, stamina, balance, safety, adaptive techniques and equipment, self-care, bowel and bladder mgt, patient and caregiver education, NMR, cognitive perceptual rx, ego support, activity tolerance. Goals have been set at supervision to min assist with ADL's and  self-care and mod I with basic mobility.    [name], MD, [place] Dom  See Team Conference Notes for weekly updates to the plan of care

## 2014-04-12 NOTE — IPOC Note (Deleted)
Overall Plan of Care [facility]) Patient Details Name: Raymond [name] MRN: [id number] DOB: [birth year]  Admitting Diagnosis: vfib tach chf  Hospital Problems: Principal Problem:   Physical debility Active Problems:   Non-ischemic cardiomyopathy   Chronic systolic heart failure   Left hemiparesis     Functional Problem List: Nursing Edema, Endurance, Medication Management, Nutrition, Perception, Skin Integrity, Sensory  PT Balance, Behavior, Edema, Endurance, Motor, Pain, Safety, Sensory  OT Balance, Behavior, Edema, Endurance, Motor, Pain, Perception, Safety, Sensory  SLP    TR Edema, Endurance, Motor, Pain, Safety, Skin Integrity       Basic ADL's: OT Bathing, Dressing, Toileting     Advanced  ADL's: OT       Transfers: PT Bed Mobility, Bed to Chair, Car, Occupational psychologist, Research scientist (life sciences): PT Ambulation, Psychologist, prison and probation services, Stairs     Additional Impairments: OT Fuctional Use of Upper Extremity  SLP        TR      Anticipated Outcomes Item Anticipated Outcome  Self Feeding Mod I  Swallowing      Basic self-care  Min A  Toileting  Supervision   Bathroom Transfers Supervision  Bowel/Bladder  Ciont of B+B with level 5 assist of staff  Transfers  Mod I with SPC  Locomotion  Mod I ambulation x 30'  Communication     Cognition     Pain  N/a  Chiropodist with cues/safety initiatives   Therapy Plan: PT Intensity: Minimum of 1-2 x/day ,45 to 90 minutes PT Frequency: 5 out of 7 days PT Duration Estimated Length of Stay: 7-10 days OT Intensity: Minimum of 1-2 x/day, 45 to 90 minutes OT Frequency: 5 out of 7 days OT Duration/Estimated Length of Stay: 7-10 days         Team Interventions: Nursing Interventions Patient/Family Education, Cognitive Remediation/Compensation, Discharge Planning, Disease Management/Prevention, Psychosocial Support, Medication Management  PT interventions Ambulation/gait training, Discharge  planning, Functional mobility training, Psychosocial support, Therapeutic Activities, Balance/vestibular training, Disease management/prevention, Neuromuscular re-education, Therapeutic Exercise, Wheelchair propulsion/positioning, Cognitive remediation/compensation, DME/adaptive equipment instruction, Pain management, Splinting/orthotics, UE/LE Strength taining/ROM, Firefighter, Equities trader education, Development worker, international aid stimulation, Museum/gallery curator, UE/LE Coordination activities  OT Interventions Warden/ranger, Discharge planning, DME/adaptive equipment instruction, Functional mobility training, Pain management, Patient/family education, Psychosocial support, Self Care/advanced ADL retraining, Therapeutic Activities, Therapeutic Exercise, UE/LE Strength taining/ROM, UE/LE Coordination activities  SLP Interventions    TR Interventions    SW/CM Interventions Discharge Planning, Psychosocial Support, Patient/Family Education    Team Discharge Planning: Destination: PT-Home ,OT- Home , SLP-  Projected Follow-up: PT-Home health PT, OT-  Home health OT, SLP-  Projected Equipment Needs: PT-To be determined, OT- To be determined, SLP-  Equipment Details: PT-Pt already owns a [facility] (for transfers and short distance ambulation) and QC (for use on step to enter/exit home); no w/c and no leg brace, OT-  Patient/family involved in discharge planning: PT- Patient, Family member/caregiver,  OT-Patient, SLP-   MD ELOS: 8-12d Medical Rehab Prognosis:  Good Assessment: 54 y.o. 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 CXR with evidence of fluid overload, K+ at low normal levels and patient reported weight up by 4 lbs. Past admission, he was shocked for VT on  the evening of  3/12 and again on the evening of 3/13. He was started on IV amiodarone and magnesium/potassium supplemented.  He was evaluated by Dr Graciela Husbands and started on ranolazine and has been transitioned back to po amiodarone. He was diuresed with milrinone and lasix for acute on chronic systolic CHF with good results. Co-ox levels elevated today and recheck pending Admitted to CIR for deconditioning  Now requiring 24/7 Rehab RN,MD, as well as CIR level PT, OT and SLP.  Treatment team will focus on ADLs and mobility with goals set at sup/minA   See Team Conference Notes for weekly updates to the plan of care

## 2014-04-12 NOTE — Care Management Note (Signed)
Inpatient Rehabilitation Center Individual Statement of Services  Patient Name:  Raymond [name]  Date:  04/12/2014  Welcome to the Inpatient Rehabilitation Center.  Our goal is to provide you with an individualized program based on your diagnosis and situation, designed to meet your specific needs.  With this comprehensive rehabilitation program, you will be expected to participate in at least 3 hours of rehabilitation therapies Monday-Friday, with modified therapy programming on the weekends.  Your rehabilitation program will include the following services:  Physical Therapy (PT), Occupational Therapy (OT), 24 hour per day rehabilitation nursing, Therapeutic Recreaction (TR), Case Management (Social Worker), Rehabilitation Medicine, Nutrition Services and Pharmacy Services  Weekly team conferences will be held on Tuesdays to discuss your progress.  Your Social Worker will talk with you frequently to get your input and to update you on team discussions.  Team conferences with you and your family in attendance may also be held.  Expected length of stay: 7 days  Overall anticipated outcome: modified independent  Depending on your progress and recovery, your program may change. Your Social Worker will coordinate services and will keep you informed of any changes. Your Social Worker's name and contact numbers are listed  below.  The following services may also be recommended but are not provided by the Inpatient Rehabilitation Center:   Driving Evaluations  Home Health Rehabiltiation Services  Outpatient Rehabilitation Services    Arrangements will be made to provide these services after discharge if needed.  Arrangements include referral to agencies that provide these services.  Your insurance has been verified to be:  Medicare Your primary doctor is:  Nilda Simmer  Pertinent information will be shared with your doctor and your insurance company.  Social Worker:  [place], [place]  [phone] or ([phone]   Information discussed with and copy given to patient by: Amada Jupiter, 04/12/2014, 9:09 AM

## 2014-04-12 NOTE — Progress Notes (Signed)
ANTICOAGULATION CONSULT NOTE - Follow Up Consult  Pharmacy Consult for Coumadin Indication: atrial fibrillation  Allergies  Allergen Reactions  . Valsartan Cough    Patient Measurements: Height: 5\' 9"  (175.3 cm) Weight: 265 lb 14 oz (120.6 kg) IBW/kg (Calculated) : 70.7 Heparin Dosing Weight:   Vital Signs: Temp: 98.2 F (36.8 C) (03/25 0437) Temp Source: Oral (03/25 0437) BP: 117/64 mmHg (03/25 0437) Pulse Rate: 72 (03/25 0437)  Labs:  Recent Labs  04/10/14 0557 04/11/14 0545 04/12/14 0500  HGB 13.0 12.7*  --   HCT 39.7 38.9*  --   PLT 340 359  --   LABPROT 37.9* 32.9* 28.1*  INR 3.82* 3.18* 2.61*  CREATININE 1.83* 1.86* 1.75*    Estimated Creatinine Clearance: 64.1 mL/min (by C-G formula based on Cr of 1.75).   Medications:  Scheduled:  . acyclovir  400 mg Oral q morning - 10a  . amiodarone  200 mg Oral BID  . cyclobenzaprine  10 mg Oral BID  . doxycycline  100 mg Oral Q12H  . levothyroxine  250 mcg Oral QAC breakfast  . magnesium oxide  400 mg Oral Daily  . [START ON 04/13/2014] potassium chloride  20 mEq Oral Daily  . predniSONE  10 mg Oral BID WC  . ranolazine  1,000 mg Oral BID  . senna-docusate  2 tablet Oral QHS  . spironolactone  25 mg Oral BID  . torsemide  80 mg Oral BID  . Warfarin - Pharmacist Dosing Inpatient   Does not apply q1800    Assessment: 54yo male with AFib.  INR peaked on 3/23 and is down to 2.61 today.  CBC was stable on 3/24, no bleeding noted.  Goal of Therapy:  INR 2-3 Monitor platelets by anticoagulation protocol: Yes   Plan:  Coumadin 3mg  Daily INR  Marisue Humble, PharmD Clinical Pharmacist [facility]

## 2014-04-12 NOTE — Progress Notes (Addendum)
[place] PHYSICAL MEDICINE & REHABILITATION     PROGRESS NOTE    Subjective/Complaints: Didn't sleep as well. Right ankle still sore. No cough, sob,cp,n,v  Objective: Vital Signs: Blood pressure 117/64, pulse 72, temperature 98.2 F (36.8 C), temperature source Oral, resp. rate 18, height 5\' 9"  (1.753 m), weight 120.6 kg (265 lb 14 oz), SpO2 97 %. No results found.  Recent Labs  04/10/14 0557 04/11/14 0545  WBC 11.8* 9.0  HGB 13.0 12.7*  HCT 39.7 38.9*  PLT 340 359    Recent Labs  04/11/14 0545 04/12/14 0500  NA 133* 132*  K 4.1 5.1  CL 96 100  GLUCOSE 124* 112*  BUN 30* 27*  CREATININE 1.86* 1.75*  CALCIUM 9.1 8.9   CBG (last 3)  No results for input(s): GLUCAP in the last 72 hours.  Wt Readings from Last 3 Encounters:  04/12/14 120.6 kg (265 lb 14 oz)  04/09/14 120.611 kg (265 lb 14.4 oz)  03/27/14 122.925 kg (271 lb)    Physical Exam:  Constitutional: He is oriented to person, place, and time. He appears well-developed and well-nourished.  HENT: dentition fair. Mucosa moist Head: Normocephalic and atraumatic.  Eyes: Conjunctivae and EOM are normal. Pupils are equal, round, and reactive to light.  Neck: No tracheal deviation present. No thyromegaly present.  Cardiovascular: Normal rate and regular rhythm. Exam reveals no friction rub.  No murmur heard. Respiratory: Effort normal and breath sounds normal. No respiratory distress. He has no wheezes.  GI: Soft. Bowel sounds are normal. He exhibits no distension. There is no tenderness.  Musculoskeletal: He exhibits no edema or tenderness.  Right foot with erythema at heel and laterally. No pain with ROM. mild pain with palpation over the mid foot. Neurological: He is alert and oriented to person, place, and time.  Slow measured speech. He was able to follow basic commands without difficulty. Left hemiparesis UE> LE with sensory deficits. LUE 2+ to 3-/5. LLE: 3/5 HF, 4-KE and 2+ aDF, 3+apf, RUE  and RLE 4 to 4+/5, some limitations with movement of right foot. Mild left facial droop. Speech clear.  Skin: Skin is warm and dry. Some redness/warmth at right ankle. Marks on feet from crossing legs Psychiatric: His mood appears is fairly appropriate.  His speech is delayed. He is slowed.    Assessment/Plan: 1. Functional deficits secondary to debility after chf/cellulitis which require 3+ hours per day of interdisciplinary therapy in a comprehensive inpatient rehab setting. Physiatrist is providing close team supervision and 24 hour management of active medical problems listed below. Physiatrist and rehab team continue to assess barriers to discharge/monitor patient progress toward functional and medical goals. FIM: FIM - Bathing Bathing Steps Patient Completed: Chest, Left Arm, Abdomen, Front perineal area, Right upper leg, Left upper leg Bathing: 3: Mod-Patient completes 5-7 61f 10 parts or 50-74%  FIM - Upper Body Dressing/Undressing Upper body dressing/undressing: 0: Wears gown/pajamas-no public clothing FIM - Lower Body Dressing/Undressing Lower body dressing/undressing steps patient completed: Don/Doff right shoe, Don/Doff left shoe Lower body dressing/undressing: 0: Wears gown/pajamas-no public clothing  FIM - Toileting Toileting: 5: Set-up assist to: Obtain supplies     FIM - Banker Devices: Teacher, music: 4: Chair or W/C > Bed: Min A (steadying Pt. > 75%)  FIM - Locomotion: Wheelchair Distance: 50 Locomotion: Wheelchair: 2: Travels 50 - 149 ft with supervision, cueing or coaxing FIM - Locomotion: Ambulation Locomotion: Ambulation Assistive Devices: Emergency planning/management officer Ambulation/Gait Assistance: 1: +2 Total  assist Locomotion: Ambulation: 1: Two helpers  Comprehension Comprehension Mode: Auditory Comprehension: 6-Follows complex conversation/direction: With extra time/assistive device  Expression Expression Mode:  Verbal Expression: 6-Expresses complex ideas: With extra time/assistive device  Social Interaction Social Interaction: 5-Interacts appropriately 90% of the time - Needs monitoring or encouragement for participation or interaction.  Problem Solving Problem Solving: 5-Solves basic 90% of the time/requires cueing < 10% of the time  Memory Memory: 4-Recognizes or recalls 75 - 89% of the time/requires cueing 10 - 24% of the time Medical Problem List and Plan: 1. Functional deficits secondary to debility after multiple medical issues/chf. Right foot cellulitis 2. DVT Prophylaxis/Anticoagulation: Pharmaceutical: Coumadin 3. Pain Management: will continue flexeril and oxycodone prn for pain.  4. Anxiety/Mood: continue xanax prn. Team to provide ego support and encouragement. LCSW to follow for evaluation and support.  5. Neuropsych: This patient is capable of making decisions on his own behalf. 6. Skin/Wound Care: Routine pressure relief measures. Maintain adequate nutritional status. Offer supplements  7. Fluids/Electrolytes/Nutrition: Needs to limit water/ice intake to avoid overload and hyponatremia.   -hold potassium today due to hyperkalemia---recheck tomorrow 8. 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 On home milrinone as 0.375, demadex 80 mg bid, spironolactone 25 mg bid. Not a good cadidate for advance therapies per cardiology. 120kg 9. VT/NSVT:  . To continue ranolazine, amiodarone bid, kdur 40mq bid (hold) and mag ox daily. Monitor lytes daily  10. A fib: Heart rate controlled. On warfarin.  11. H/o CVA with left hemiparesis 12. CKD: Monitor lytes daily. Renal status at baseline 13. Leukocytosis: down to 9.0  -no clinical signs of infection  -remains on doxycycline 14. Right ankle pain: Uric Acid, 9.4  -ice  -give prednisone  x 2 doses   . LOS (Days)  3 A FACE TO FACE EVALUATION WAS PERFORMED  SWARTZ,ZACHARY T 04/12/2014 8:32 AM

## 2014-04-13 ENCOUNTER — Inpatient Hospital Stay (HOSPITAL_COMMUNITY): Payer: Medicare Other | Admitting: Occupational Therapy

## 2014-04-13 ENCOUNTER — Inpatient Hospital Stay (HOSPITAL_COMMUNITY): Payer: Medicare Other | Admitting: Physical Therapy

## 2014-04-13 DIAGNOSIS — K5901 Slow transit constipation: Secondary | ICD-10-CM

## 2014-04-13 LAB — BASIC METABOLIC PANEL
Anion gap: 8 (ref 5–15)
BUN: 30 mg/dL — ABNORMAL HIGH (ref 6–23)
CALCIUM: 9.1 mg/dL (ref 8.4–10.5)
CO2: 27 mmol/L (ref 19–32)
CREATININE: 1.87 mg/dL — AB (ref 0.50–1.35)
Chloride: 101 mmol/L (ref 96–112)
GFR calc Af Amer: 46 mL/min — ABNORMAL LOW (ref >90)
GFR calc non Af Amer: 40 mL/min — ABNORMAL LOW (ref >90)
Glucose, Bld: 125 mg/dL — ABNORMAL HIGH (ref 70–99)
Potassium: 3.8 mmol/L (ref 3.5–5.1)
SODIUM: 136 mmol/L (ref 135–145)

## 2014-04-13 LAB — PROTIME-INR
INR: 1.81 — ABNORMAL HIGH (ref 0.00–1.49)
Prothrombin Time: 21.1 seconds — ABNORMAL HIGH (ref 11.6–15.2)

## 2014-04-13 LAB — GLUCOSE, CAPILLARY: GLUCOSE-CAPILLARY: 120 mg/dL — AB (ref 70–99)

## 2014-04-13 MED ORDER — WARFARIN SODIUM 7.5 MG PO TABS
7.5000 mg | ORAL_TABLET | Freq: Once | ORAL | Status: AC
  Administered 2014-04-13: 7.5 mg via ORAL
  Filled 2014-04-13: qty 1

## 2014-04-13 MED ORDER — SORBITOL 70 % SOLN: 60.0000 mL | Freq: Every day | Status: DC | PRN

## 2014-04-13 NOTE — Progress Notes (Signed)
Raymond [name] is a 54 y.o. male 06-07-1960 [id number]  Subjective: C/o being lightheaded when standing up - not new. Constipated. Slept well. Feeling OK.  Objective: Vital signs in last 24 hours: Temp:  [97.7 F (36.5 C)-98.5 F (36.9 C)] 97.7 F (36.5 C) (03/26 0440) Pulse Rate:  [69-70] 69 (03/26 0440) Resp:  [18-20] 20 (03/26 0440) BP: (104-119)/(72-78) 119/72 mmHg (03/26 0440) SpO2:  [95 %-99 %] 99 % (03/26 0440) Weight:  [260 lb 5.8 oz (118.1 kg)] 260 lb 5.8 oz (118.1 kg) (03/26 0440) Weight change: -5 lb 8.2 oz (-2.5 kg) Last BM Date: 04/12/14  Intake/Output from previous day: 03/25 0701 - 03/26 0700 In: 480 [P.O.:480] Out: 3600 [Urine:3600] Last cbgs: CBG (last 3)  No results for input(s): GLUCAP in the last 72 hours.   Physical Exam General: No apparent distress. Obese HEENT: not dry Lungs: Normal effort. Lungs clear to auscultation, no crackles or wheezes. Cardiovascular: Regular rate and rhythm, no edema Abdomen: S/NT/ND; BS(+) Musculoskeletal:  unchanged Neurological: No new neurological deficits Wounds: N/A    Skin: clear  Aging changes Mental state: Alert, oriented, cooperative    Lab Results: BMET    Component Value Date/Time   NA 136 04/13/2014 0520   K 3.8 04/13/2014 0520   CL 101 04/13/2014 0520   CO2 27 04/13/2014 0520   GLUCOSE 125* 04/13/2014 0520   BUN 30* 04/13/2014 0520   CREATININE 1.87* 04/13/2014 0520   CREATININE 1.53* 08/27/2013 1350   CALCIUM 9.1 04/13/2014 0520   GFRNONAA 40* 04/13/2014 0520   GFRNONAA 52* 08/27/2013 1350   GFRAA 46* 04/13/2014 0520   GFRAA 60 08/27/2013 1350   CBC    Component Value Date/Time   WBC 9.0 04/11/2014 0545   WBC 7.5 04/28/2012 1200   RBC 4.21* 04/11/2014 0545   RBC 4.49* 04/28/2012 1200   HGB 12.7* 04/11/2014 0545   HGB 13.5* 04/28/2012 1200   HCT 38.9* 04/11/2014 0545   HCT 43.6 04/28/2012 1200   PLT 359 04/11/2014 0545   MCV 92.4 04/11/2014 0545   MCV 97.0 04/28/2012 1200   MCH  30.2 04/11/2014 0545   MCH 30.1 04/28/2012 1200   MCHC 32.6 04/11/2014 0545   MCHC 31.0* 04/28/2012 1200   RDW 15.8* 04/11/2014 0545   LYMPHSABS 1.1 04/10/2014 0557   MONOABS 1.0 04/10/2014 0557   EOSABS 0.1 04/10/2014 0557   BASOSABS 0.1 04/10/2014 0557    Studies/Results: No results found.  Medications: I have reviewed the patient's current medications.  Assessment/Plan:  1. Functional deficits secondary to debility after multiple medical issues/chf. Right foot cellulitis 2. DVT Prophylaxis/Anticoagulation: Pharmaceutical: Coumadin 3. Pain Management: will continue flexeril and oxycodone prn for pain.  4. Anxiety/Mood: continue xanax prn. Team to provide ego support and encouragement. LCSW to follow for evaluation and support.  5. Neuropsych: This patient is capable of making decisions on his own behalf. 6. Skin/Wound Care: Routine pressure relief measures. Maintain adequate nutritional status. Offer supplements  7. Fluids/Electrolytes/Nutrition: Needs to limit water/ice intake to avoid overload and hyponatremia.  -hold potassium today due to hyperkalemia---recheck tomorrow 8. 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 On home milrinone as 0.375, demadex 80 mg bid, spironolactone 25 mg bid. Not a good cadidate for advance therapies per cardiology. 120kg 9. VT/NSVT: . To continue ranolazine, amiodarone bid, kdur 76mq bid (hold) and mag ox daily. Monitor lytes daily  10. A fib: Heart rate controlled. On warfarin.  11. H/o CVA with left hemiparesis 12. CKD: Monitor lytes daily. Renal status at baseline 13. Leukocytosis: down to 9.0 -no clinical signs of infection -remains on doxycycline 14. Right ankle pain: Uric Acid, 9.4 -ice -give prednisone  x 2 doses 15. Orthostatic sx's - not new. Will  watch    Length of stay, days: 4  Sonda Primes , MD 04/13/2014, 9:39 AM

## 2014-04-13 NOTE — Progress Notes (Signed)
Physical Therapy Session Note  Patient Details  Name: Raymond [name] MRN: [id number] Date of Birth: 12-04-1960  Today's Date: 04/13/2014 PT Individual Time: 1100-1200 Treatment Session 2: 1430-1505 PT Individual Time Calculation (min): 60 min  Treatment Session 2: 35 min  Short Term Goals: Week 1:  PT Short Term Goal 1 (Week 1): STGs = LTGs due to ELOS  Skilled Therapeutic Interventions/Progress Updates:  Treatment Session 1:   Gait Training: PT sets up curb step in front of round doorknob on R with low 2" step in front of curb step, and quad cane beside low step, to simulate how pt enters/exits home. Pt req min A to ascend low & curb step with SPC and doorknob, then min A to descend curb & low step with quad cane x 2 reps req min A progressing to CGA with practice. Pt fatigues very quickly and req rest breaks in between reps. A 3rd rep was initiated, but pt was too short of breath after a few minute rest break to safely attempt.  PT instructs pt in ambulation with SPC x 50' req CGA-min A x 1 and 2nd assist for w/c & IV pole follow - extremely fast pace and pt stubs his L foot near the end of this gait. PT instructs pt to slow down and work on balance and pt completes a 2nd and 3rd rep of 20' with SPC req close SBA x 1 and w/c & IV pole follow x 1 with a slower (but still brisk) gait speed. Multiple minute rest breaks required in between each bout of gait.   Treatment Session 2: Therapeutic Activity: PT instructs pt in car transfer from w/c req verbal cues for safety with [facility] - simulated car set at mother's SUV height with pt and daughter input. PT instructs pt to move slowly and steadily for utmost safety during the transfer. Pt is visibly short of breath with this activity and req rest break in between transferring in and out of car - req SBA.   W/C Management: PT instructs pt in w/c propulsion with R UE/R LE x 15-20' req mod A for steering - pt demonstrates poor coordination using hemi  technique and L UE does not have fine motor control to assist. L LE is unable to effectively assist, as well, due to tone inhibiting motor control.  Pt's sister asks if a power w/c would be appropriate for the patient. 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 PT to bring up this possibility in conference.    Pt's independence is currently limited not only by cardiac comorbidities influence low activity tolerance & h/o CVA contributing to balance impairment, but pt's current attachment to an IV pole is necessitating +2 assist for gait for safety. Pt is a good candidate for a hov-a-round/scooter type of powered mobility device for improved safety in the home, as well as community access. Continue with stair training, gait training with +2 when IV pole is attached, standing balance reeducation, and slow/safe progression of activity tolerance with careful observation of vitals and pt symptoms.   Therapy Documentation Precautions:  Precautions Precautions: Fall, ICD/Pacemaker, Other (comment) Precaution Comments: pacemaker placed 5 years ago; L side hemiplegia: L arm weaker than L leg Restrictions Weight Bearing Restrictions: No Vital Signs: Therapy Vitals Pulse Rate: 70 BP: Marland Kitchen)  95/56 mmHg Patient Position (if appropriate): Sitting Oxygen Therapy SpO2: 99 % O2 Device: Not Delivered Pain: Pain Assessment Pain Assessment: 0-10 Pain Score: 3  Pain Type: Acute pain Pain Location: Ankle Pain Orientation: Right Pain Descriptors / Indicators: Aching Pain Onset: On-going Pain Intervention(s): Rest;Emotional support Multiple Pain Sites: No  Treatment Session 2: Pt reports minimal pain in R ankle with weight-bearing activity; this decreases with rest.   See FIM for current  functional status  Therapy/Group: Individual Therapy  Lowell Makara M 04/13/2014, 11:09 AM

## 2014-04-13 NOTE — Progress Notes (Signed)
ANTICOAGULATION CONSULT NOTE - Follow Up Consult  Pharmacy Consult:  Coumadin Indication: atrial fibrillation  Allergies  Allergen Reactions  . Valsartan Cough    Patient Measurements: Height: 5\' 9"  (175.3 cm) Weight: 260 lb 5.8 oz (118.1 kg) IBW/kg (Calculated) : 70.7  Vital Signs: Temp: 97.7 F (36.5 C) (03/26 0440) Temp Source: Oral (03/26 0440) BP: 95/56 mmHg (03/26 1107) Pulse Rate: 70 (03/26 1107)  Labs:  Recent Labs  04/11/14 0545 04/12/14 0500 04/13/14 0520  HGB 12.7*  --   --   HCT 38.9*  --   --   PLT 359  --   --   LABPROT 32.9* 28.1* 21.1*  INR 3.18* 2.61* 1.81*  CREATININE 1.86* 1.75* 1.87*    Estimated Creatinine Clearance: 59.3 mL/min (by C-G formula based on Cr of 1.87).    Assessment: 77 YOF with Afib to continue on Coumadin.  Patient's INRs have been fluctuating, currently sub-therapeutic.  No bleeding reported.   Goal of Therapy:  INR 2-3    Plan:  - Coumadin 7.5mg  PO today - Daily PT / INR    Lettie Czarnecki D. Laney Potash, PharmD, BCPS Pager:  [phone] 04/13/2014, 1:34 PM

## 2014-04-13 NOTE — Progress Notes (Signed)
Occupational Therapy Session Note  Patient Details  Name: Raymond [name] MRN: [id number] Date of Birth: 1960/06/14  Today's Date: 04/13/2014 OT Individual Time:  -   1st session    0830-0930  (60 min)                                          2nd session: 1030-1100 (30 min)   Short Term Goals: Week 1:  OT Short Term Goal 1 (Week 1): STG=LTG due to short length of stay.   Skilled Therapeutic Interventions/Progress Updates:   1st session:   Pt seen for 1:1 OT session with focus on functional transfer, safety awareness, dynamic standing balance, and activity tolerance. Pt received sitting in wc.  Agreed to participate in bathing and dressing.   Sit to Stand=  Minimal assist- ambulated with [place] to wc with minimal assist tp sink.  BP= 115/80 throughout session.     Stood at sink x3 for  bathing and dressing for 30 sec to 50 sec before sitting. Pt. Complained of right ankle pain with increased time spent in standing.   Educated pt on taking his time and moving with control.  No c/o of dizziness during session.  Left pt in wc call bell,phone within reach.       2nd session:Time:  1030-1100  (30) Pain:  4/10 right ankle Individual session:  Engaged in functional standing balance at sink.  BP= 110/80 throughout session.  Wrapped right ankle in ace bandgage for increased support.  Pt. Stood for 2 minutes with min guard; 3 minutes with min guard.  He stated still hurt some but eased off when he sat.  Left pt in wc with  call bell,phone within reach.   .    Therapy Documentation Precautions:  Precautions Precautions: Fall, ICD/Pacemaker, Other (comment) Precaution Comments: pacemaker placed 5 years ago; L side hemiplegia: L arm weaker than L leg Restrictions Weight Bearing Restrictions: No      Pain:  4/10  Right ankle in 1st and 2nd session          See FIM for current functional status  Therapy/Group: Individual Therapy  Humberto Seals 04/13/2014, 8:17 AM

## 2014-04-14 ENCOUNTER — Inpatient Hospital Stay (HOSPITAL_COMMUNITY): Payer: Medicare Other

## 2014-04-14 LAB — BASIC METABOLIC PANEL
Anion gap: 5 (ref 5–15)
BUN: 32 mg/dL — ABNORMAL HIGH (ref 6–23)
CO2: 28 mmol/L (ref 19–32)
Calcium: 8.7 mg/dL (ref 8.4–10.5)
Chloride: 97 mmol/L (ref 96–112)
Creatinine, Ser: 1.8 mg/dL — ABNORMAL HIGH (ref 0.50–1.35)
GFR calc Af Amer: 49 mL/min — ABNORMAL LOW (ref >90)
GFR calc non Af Amer: 42 mL/min — ABNORMAL LOW (ref >90)
Glucose, Bld: 140 mg/dL — ABNORMAL HIGH (ref 70–99)
POTASSIUM: 3.8 mmol/L (ref 3.5–5.1)
Sodium: 130 mmol/L — ABNORMAL LOW (ref 135–145)

## 2014-04-14 LAB — CBC
HCT: 36.8 % — ABNORMAL LOW (ref 39.0–52.0)
Hemoglobin: 11.8 g/dL — ABNORMAL LOW (ref 13.0–17.0)
MCH: 29.6 pg (ref 26.0–34.0)
MCHC: 32.1 g/dL (ref 30.0–36.0)
MCV: 92.5 fL (ref 78.0–100.0)
Platelets: 377 10*3/uL (ref 150–400)
RBC: 3.98 MIL/uL — AB (ref 4.22–5.81)
RDW: 16.1 % — AB (ref 11.5–15.5)
WBC: 9.4 10*3/uL (ref 4.0–10.5)

## 2014-04-14 LAB — PROTIME-INR
INR: 1.55 — ABNORMAL HIGH (ref 0.00–1.49)
Prothrombin Time: 18.8 seconds — ABNORMAL HIGH (ref 11.6–15.2)

## 2014-04-14 MED ORDER — WARFARIN SODIUM 5 MG PO TABS
5.0000 mg | ORAL_TABLET | Freq: Once | ORAL | Status: AC
  Administered 2014-04-14: 5 mg via ORAL
  Filled 2014-04-14: qty 1

## 2014-04-14 NOTE — Progress Notes (Signed)
Raymond [name] is a 54 y.o. male June 21, 1960 [id number]  Subjective: C/o being lightheaded when standing up - not new. Slept well. Feeling OK.  Objective: Vital signs in last 24 hours: Temp:  [97.8 F (36.6 C)-97.9 F (36.6 C)] 97.8 F (36.6 C) (03/27 0550) Pulse Rate:  [70-72] 70 (03/27 0550) Resp:  [19-20] 20 (03/27 0550) BP: (95-116)/(56-72) 106/70 mmHg (03/27 0550) SpO2:  [95 %-100 %] 95 % (03/27 0550) Weight:  [265 lb 6.9 oz (120.4 kg)] 265 lb 6.9 oz (120.4 kg) (03/27 0550) Weight change: 5 lb 1.1 oz (2.3 kg) Last BM Date: 04/14/14  Intake/Output from previous day: 03/26 0701 - 03/27 0700 In: 1560 [P.O.:1550; I.V.:10] Out: 900 [Urine:900] Last cbgs: CBG (last 3)   Recent Labs  04/13/14 1645  GLUCAP 120*     Physical Exam General: No apparent distress. Obese HEENT: not dry Lungs: Normal effort. Lungs clear to auscultation, no crackles or wheezes. Cardiovascular: Regular rate and rhythm, no edema Abdomen: S/NT/ND; BS(+) Musculoskeletal:  unchanged Neurological: No new neurological deficits Wounds: N/A    Skin: clear  Aging changes Mental state: Alert, oriented, cooperative    Lab Results: BMET    Component Value Date/Time   NA 130* 04/14/2014 0429   K 3.8 04/14/2014 0429   CL 97 04/14/2014 0429   CO2 28 04/14/2014 0429   GLUCOSE 140* 04/14/2014 0429   BUN 32* 04/14/2014 0429   CREATININE 1.80* 04/14/2014 0429   CREATININE 1.53* 08/27/2013 1350   CALCIUM 8.7 04/14/2014 0429   GFRNONAA 42* 04/14/2014 0429   GFRNONAA 52* 08/27/2013 1350   GFRAA 49* 04/14/2014 0429   GFRAA 60 08/27/2013 1350   CBC    Component Value Date/Time   WBC 9.4 04/14/2014 0429   WBC 7.5 04/28/2012 1200   RBC 3.98* 04/14/2014 0429   RBC 4.49* 04/28/2012 1200   HGB 11.8* 04/14/2014 0429   HGB 13.5* 04/28/2012 1200   HCT 36.8* 04/14/2014 0429   HCT 43.6 04/28/2012 1200   PLT 377 04/14/2014 0429   MCV 92.5 04/14/2014 0429   MCV 97.0 04/28/2012 1200   MCH 29.6  04/14/2014 0429   MCH 30.1 04/28/2012 1200   MCHC 32.1 04/14/2014 0429   MCHC 31.0* 04/28/2012 1200   RDW 16.1* 04/14/2014 0429   LYMPHSABS 1.1 04/10/2014 0557   MONOABS 1.0 04/10/2014 0557   EOSABS 0.1 04/10/2014 0557   BASOSABS 0.1 04/10/2014 0557    Studies/Results: No results found.  Medications: I have reviewed the patient's current medications.  Assessment/Plan:  1. Functional deficits secondary to debility after multiple medical issues/chf. Right foot cellulitis 2. DVT Prophylaxis/Anticoagulation: Pharmaceutical: Coumadin 3. Pain Management: will continue flexeril and oxycodone prn for pain.  4. Anxiety/Mood: continue xanax prn. Team to provide ego support and encouragement. LCSW to follow for evaluation and support.  5. Neuropsych: This patient is capable of making decisions on his own behalf. 6. Skin/Wound Care: Routine pressure relief measures. Maintain adequate nutritional status. Offer supplements  7. Fluids/Electrolytes/Nutrition: Needs to limit water/ice intake to avoid overload and hyponatremia.  -hold potassium today due to hyperkalemia---recheck tomorrow 8. 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 On home milrinone as 0.375, demadex 80 mg bid, spironolactone 25 mg bid. Not a good cadidate for advance therapies per cardiology. 120kg 9. VT/NSVT: . To continue ranolazine, amiodarone bid, kdur 40mq bid (hold) and mag ox daily. Monitor lytes daily  10. A fib: Heart rate controlled. On warfarin.  11. H/o CVA with left hemiparesis 12. CKD: Monitor lytes daily. Renal status at baseline 13. Leukocytosis: down to 9.0 -no clinical signs of infection -remains on doxycycline 14. Right ankle pain: Uric Acid, 9.4 -ice -give prednisone 10mg  x 2 doses 15. Orthostatic sx's - not new. Orthostatic  VS bid x 2 days    Length of stay, days: 5  Sonda Primes , MD 04/14/2014, 8:44 AM

## 2014-04-14 NOTE — Progress Notes (Signed)
Physical Therapy Note  Patient Details  Name: Raymond [name] MRN: [id number] Date of Birth: [birth year] Today's Date: 04/14/2014    Attempted to see pt for treatment on this date. Pt refused participation in therapy, stating that he was significantly fatigued since he had been up all night due to his IV medicine running out and him feeling ill due to this. Will attempt to make up time later today with pt if schedule allows. Otherwise will follow up per POC  Glenetta Hew, [name] 04/14/2014, 8:24 AM

## 2014-04-14 NOTE — Progress Notes (Signed)
ANTICOAGULATION CONSULT NOTE - Follow Up Consult  Pharmacy Consult:  Coumadin Indication: atrial fibrillation  Allergies  Allergen Reactions  . Valsartan Cough    Patient Measurements: Height: 5\' 9"  (175.3 cm) Weight: 265 lb 6.9 oz (120.4 kg) IBW/kg (Calculated) : 70.7  Vital Signs: Temp: 97.8 F (36.6 C) (03/27 0550) Temp Source: Oral (03/27 0550) BP: 106/70 mmHg (03/27 0550) Pulse Rate: 70 (03/27 0550)  Labs:  Recent Labs  04/12/14 0500 04/13/14 0520 04/14/14 0429  HGB  --   --  11.8*  HCT  --   --  36.8*  PLT  --   --  377  LABPROT 28.1* 21.1* 18.8*  INR 2.61* 1.81* 1.55*  CREATININE 1.75* 1.87* 1.80*    Estimated Creatinine Clearance: 62.2 mL/min (by C-G formula based on Cr of 1.8).    Assessment: 39 YOF with Afib to continue on Coumadin.  Patient's INRs have been fluctuating, currently sub-therapeutic.  Expect INR to trend up in AM and will be cautious with dosing given previous trend.  No bleeding reported.   Goal of Therapy:  INR 2-3    Plan:  - Coumadin 5mg  PO today - Daily PT / INR    Franziska Podgurski D. Laney Potash, PharmD, BCPS Pager:  [phone] 04/14/2014, 10:24 AM

## 2014-04-15 ENCOUNTER — Inpatient Hospital Stay (HOSPITAL_COMMUNITY): Payer: Medicare Other

## 2014-04-15 ENCOUNTER — Inpatient Hospital Stay (HOSPITAL_COMMUNITY): Payer: Medicare Other | Admitting: Physical Therapy

## 2014-04-15 LAB — BASIC METABOLIC PANEL
Anion gap: 8 (ref 5–15)
BUN: 28 mg/dL — ABNORMAL HIGH (ref 6–23)
CO2: 28 mmol/L (ref 19–32)
Calcium: 9.1 mg/dL (ref 8.4–10.5)
Chloride: 96 mmol/L (ref 96–112)
Creatinine, Ser: 1.76 mg/dL — ABNORMAL HIGH (ref 0.50–1.35)
GFR, EST AFRICAN AMERICAN: 50 mL/min — AB (ref >90)
GFR, EST NON AFRICAN AMERICAN: 43 mL/min — AB (ref >90)
GLUCOSE: 136 mg/dL — AB (ref 70–99)
POTASSIUM: 4.3 mmol/L (ref 3.5–5.1)
Sodium: 132 mmol/L — ABNORMAL LOW (ref 135–145)

## 2014-04-15 LAB — PROTIME-INR
INR: 1.81 — ABNORMAL HIGH (ref 0.00–1.49)
PROTHROMBIN TIME: 21.1 s — AB (ref 11.6–15.2)

## 2014-04-15 MED ORDER — POTASSIUM CHLORIDE CRYS ER 20 MEQ PO TBCR
20.0000 meq | EXTENDED_RELEASE_TABLET | Freq: Two times a day (BID) | ORAL | Status: DC
  Administered 2014-04-15 – 2014-04-18 (×7): 20 meq via ORAL
  Filled 2014-04-15 (×12): qty 1

## 2014-04-15 MED ORDER — WARFARIN SODIUM 5 MG PO TABS
5.0000 mg | ORAL_TABLET | Freq: Once | ORAL | Status: AC
Start: 2014-04-15 — End: 2014-04-15
  Administered 2014-04-15: 5 mg via ORAL
  Filled 2014-04-15: qty 1

## 2014-04-15 MED ORDER — METOLAZONE 2.5 MG PO TABS
2.5000 mg | ORAL_TABLET | Freq: Every day | ORAL | Status: AC
  Administered 2014-04-15 – 2014-04-16 (×2): 2.5 mg via ORAL
  Filled 2014-04-15 (×2): qty 1

## 2014-04-15 NOTE — Progress Notes (Signed)
Recreational Therapy Session Note  Patient Details  Name: Raymond [name] MRN: [id number] Date of Birth: 1960/09/27/ Today's Date: 04/15/2014  Met with pt 3/24 to discuss TR services.  Pt expressed low activity tolerance and with limited interest in TR services.  Will continue to monitor through team. Riverlyn Kizziah 04/15/2014, 3:03 PM

## 2014-04-15 NOTE — Progress Notes (Signed)
[place] [facility] PHYSICAL MEDICINE & REHABILITATION     PROGRESS NOTE    Subjective/Complaints: Didn't sleep as well. Right ankle still sore. No cough, sob,cp,n,v  Objective: Vital Signs: Blood pressure 106/70, pulse 70, temperature 98.6 F (37 C), temperature source Oral, resp. rate 19, height  (1.753 m), weight 120.1 kg (264 lb 12.4 oz), SpO2 98 %. No results found.  Recent Labs  04/14/14 0429  WBC 9.4  HGB 11.8*  HCT 36.8*  PLT 377    Recent Labs  04/14/14 0429 04/15/14 0635  NA 130* 132*  K 3.8 4.3  CL 97 96  GLUCOSE 140* 136*  BUN 32* 28*  CREATININE 1.80* 1.76*  CALCIUM 8.7 9.1   CBG (last 3)   Recent Labs  04/13/14 1645  GLUCAP 120*    Wt Readings from Last 3 Encounters:  04/15/14 120.1 kg (264 lb 12.4 oz)  04/09/14 120.611 kg (265 lb 14.4 oz)  03/27/14 122.925 kg (271 lb)    Physical Exam:  Constitutional: He is oriented to person, place, and time. He appears well-developed and well-nourished.  HENT: dentition fair. Mucosa moist Head: Normocephalic and atraumatic.  Eyes: Conjunctivae and EOM are normal. Pupils are equal, round, and reactive to light.  Neck: No tracheal deviation present. No thyromegaly present.  Cardiovascular: Normal rate and regular rhythm.    No murmur heard. Respiratory: Effort normal and breath sounds normal. No respiratory distress. He has no wheezes.  GI: Soft. Bowel sounds are normal. He exhibits no distension. There is no tenderness.  Musculoskeletal: He exhibits no edema or tenderness.  Right foot with erythema at heel and laterally. No pain with ROM. mild pain with palpation over the mid foot. Neurological: He is alert and oriented to person, place, and time.  Slow measured speech. He was able to follow basic commands without difficulty. Left hemiparesis UE> LE with sensory deficits. LUE 2+ to 3-/5. LLE: 3/5 HF, 4-KE and 2+ aDF, 3+apf, RUE and RLE 4 to 4+/5, some limitations with movement of right foot. Mild  left facial droop. Speech clear.  Skin: Skin is warm and dry.  No breakdown Psychiatric: His mood appears is fairly appropriate.  His speech is delayed. He is slowed.    Assessment/Plan: 1. Functional deficits secondary to debility after chf/cellulitis which require 3+ hours per day of interdisciplinary therapy in a comprehensive inpatient rehab setting. Physiatrist is providing close team supervision and 24 hour management of active medical problems listed below. Physiatrist and rehab team continue to assess barriers to discharge/monitor patient progress toward functional and medical goals. FIM: FIM - Bathing Bathing Steps Patient Completed: Chest, Right Arm, Front perineal area, Abdomen, Left Arm, Buttocks, Right upper leg, Left upper leg, Right lower leg (including foot), Left lower leg (including foot) Bathing: 4: Steadying assist  FIM - Upper Body Dressing/Undressing Upper body dressing/undressing: 0: Wears gown/pajamas-no public clothing FIM - Lower Body Dressing/Undressing Lower body dressing/undressing steps patient completed: Don/Doff right shoe, Don/Doff left shoe Lower body dressing/undressing: 0: Wears gown/pajamas-no public clothing  FIM - Toileting Toileting steps completed by patient: Adjust clothing prior to toileting, Performs perineal hygiene, Adjust clothing after toileting Toileting: 5: Set-up assist to: Obtain supplies     FIM - Banker Devices: Cane, Arm rests Bed/Chair Transfer: 4: Chair or W/C > Bed: Min A (steadying Pt. > 75%), 6: Sit > Supine: No assist  FIM - Locomotion: Wheelchair Distance: 20 Locomotion: Wheelchair: 1: Travels less than 50 ft with moderate assistance (Pt: 50 -  74%) FIM - Locomotion: Ambulation Locomotion: Ambulation Assistive Devices: Emergency planning/management officer Ambulation/Gait Assistance: 1: +2 Total assist (SBA x 1, w/c & IV follow x 1) Locomotion: Ambulation: 1: Two  helpers  Comprehension Comprehension Mode: Auditory Comprehension: 6-Follows complex conversation/direction: With extra time/assistive device  Expression Expression Mode: Verbal Expression: 6-Expresses complex ideas: With extra time/assistive device  Social Interaction Social Interaction: 6-Interacts appropriately with others with medication or extra time (anti-anxiety, antidepressant).  Problem Solving Problem Solving: 5-Solves basic 90% of the time/requires cueing < 10% of the time  Memory Memory: 4-Recognizes or recalls 75 - 89% of the time/requires cueing 10 - 24% of the time Medical Problem List and Plan: 1. Functional deficits secondary to debility after multiple medical issues/chf. Right foot cellulitis 2. DVT Prophylaxis/Anticoagulation: Pharmaceutical: Coumadin 3. Pain Management: will continue flexeril and oxycodone prn for pain.  4. Anxiety/Mood: continue xanax prn. Team to provide ego support and encouragement. LCSW to follow for evaluation and support.  5. Neuropsych: This patient is capable of making decisions on his own behalf. 6. Skin/Wound Care: Routine pressure relief measures. Maintain adequate nutritional status. Offer supplements  7. Fluids/Electrolytes/Nutrition: Needs to limit water/ice intake to avoid overload and hyponatremia.   -potassium  -check bmet Wednesday 8. 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 On home milrinone as 0.371mcg/kg/min, demadex 80 mg bid, spironolactone 25 mg bid. Not a good cadidate for advance therapies per cardiology. 120kg  -had an episode of dizziness/palpitations yesterday when there was a 1 hour break in milrinone infusion (pharmacy issue apparently)==probably a good idea for cards to follow up regardless 9. VT/NSVT:  . To continue ranolazine, amiodarone bid, kdur 48mq bid (hold) and mag ox daily. 10. A fib:  Heart rate controlled. On warfarin.  11. H/o CVA with left hemiparesis 12. CKD: Monitor lytes daily. Renal status at baseline 13. Leukocytosis: resolved  -no clinical signs of infection  -remains on doxycycline 14. Right ankle pain: Uric Acid, 9.4  -ice  -gave prednisone x 2 doses with improvement   . LOS (Days) 6 A FACE TO FACE EVALUATION WAS PERFORMED  Nazyia Gaugh T 04/15/2014 8:14 AM

## 2014-04-15 NOTE — Progress Notes (Signed)
Patient ID: Raymond [name], male   DOB: 23-Feb-1960, 54 y.o.   MRN: [id number]   54 yo with history of nonischemic cardiomyopathy, EF 10-15% by last echo and St Jude CRT-D device, chronic atrial fibrillation s/p AV nodal ablation, h/o VT, CKD, CVA presented with VT/VF and syncope.  At last office visit, torsemide was increased and amiodarone was decreased to 200 daily.  He was hypokalemic at admission.    Admitted to [facility] after he was shocked for VT on the evening of 3/12 and again on the evening of 3/13.  Seen by Dr Graciela Husbands and started on ranolazine.  Remains on milrinone at 0.375. Creatinine stable.  He was transferred to inpatient rehab on March 22nd on milrinone at 0.375 mcg. Discharge weight was 265 pounds.   Making slow progress on rehab. Rehab limited by right ankle pain.  Over the weekend had 1 break in milrinone and he complained of dizziness and palpitations.  Cough. Denies SOB.   04/15/2014: K 4.3 Creatinine 1.76  .  Scheduled Meds: . acyclovir  400 mg Oral q morning - 10a  . amiodarone  200 mg Oral BID  . cyclobenzaprine  10 mg Oral BID  . doxycycline  100 mg Oral Q12H  . levothyroxine  250 mcg Oral QAC breakfast  . magnesium oxide  400 mg Oral Daily  . potassium chloride  20 mEq Oral Daily  . ranolazine  1,000 mg Oral BID  . senna-docusate  2 tablet Oral QHS  . spironolactone  25 mg Oral BID  . torsemide  80 mg Oral BID  . warfarin  5 mg Oral ONCE-1800  . Warfarin - Pharmacist Dosing Inpatient   Does not apply q1800   Continuous Infusions: . milrinone 0.375 mcg/kg/min (04/15/14 1049)   PRN Meds:.acetaminophen, acetaminophen, ALPRAZolam, alum & mag hydroxide-simeth, bisacodyl, diphenhydrAMINE, guaiFENesin-codeine, nitroGLYCERIN, ondansetron **OR** ondansetron (ZOFRAN) IV, oxyCODONE-acetaminophen, sodium chloride, sorbitol   Filed Vitals:   04/14/14 0550 04/14/14 1025 04/15/14 0619 04/15/14 1300  BP: 106/70   110/75  Pulse: 70 70  71  Temp: 97.8 F (36.6 C) 97 F (36.1  C) 98.6 F (37 C) 98.1 F (36.7 C)  TempSrc: Oral  Oral Oral  Resp: Height:      Weight: 265 lb 6.9 oz (120.4 kg)  264 lb 12.4 oz (120.1 kg)   SpO2: 95% 98%  97%    Intake/Output Summary (Last 24 hours) at 04/15/14 1546 Last data filed at 04/15/14 1227  Gross per 24 hour  Intake    720 ml  Output   1150 ml  Net   -430 ml    LABS: Basic Metabolic Panel:  Recent Labs  40/98/11 0429 04/15/14 0635  NA 130* 132*  K 3.8 4.3  CL 97 96  CO2 28 28  GLUCOSE 140* 136*  BUN 32* 28*  CREATININE 1.80* 1.76*  CALCIUM 8.7 9.1   Liver Function Tests: No results for input(s): AST, ALT, ALKPHOS, BILITOT, PROT, ALBUMIN in the last 72 hours. No results for input(s): LIPASE, AMYLASE in the last 72 hours. CBC:  Recent Labs  04/14/14 0429  WBC 9.4  HGB 11.8*  HCT 36.8*  MCV 92.5  PLT 377   Cardiac Enzymes: No results for input(s): CKTOTAL, CKMB, CKMBINDEX, TROPONINI in the last 72 hours. BNP: Invalid input(s): POCBNP D-Dimer: No results for input(s): DDIMER in the last 72 hours. Hemoglobin A1C: No results for input(s): HGBA1C in the last 72 hours. Fasting Lipid Panel: No  results for input(s): CHOL, HDL, LDLCALC, TRIG, CHOLHDL, LDLDIRECT in the last 72 hours. Thyroid Function Tests: No results for input(s): TSH, T4TOTAL, T3FREE, THYROIDAB in the last 72 hours.  Invalid input(s): FREET3 Anemia Panel: No results for input(s): VITAMINB12, FOLATE, FERRITIN, TIBC, IRON, RETICCTPCT in the last 72 hours.  RADIOLOGY: Dg Chest 2 View  03/28/2014   CLINICAL DATA:  Shortness of breath since last night.  EXAM: CHEST  2 VIEW  COMPARISON:  02/18/2014  FINDINGS: Pacer/AICD device.  A right internal jugular line terminates over the internal jugular vein. Midline trachea. Moderate cardiomegaly. No pleural effusion or pneumothorax. Mild interstitial edema. No lobar consolidation.  IMPRESSION: Mild congestive heart failure.  Right internal jugular line with tip projecting over  the low right neck. Presuming low SVC position is desired, this should be advanced approximately 11 cm.   Electronically Signed   By: Jeronimo Greaves M.D.   On: 03/28/2014 20:47   Dg Chest Port 1 View  04/02/2014   CLINICAL DATA:  Central line placement  EXAM: PORTABLE CHEST - 1 VIEW  COMPARISON:  03/30/2014  FINDINGS: Marked cardiac enlargement. Pulmonary vascular congestion and mild interstitial edema is unchanged. No significant effusion.  Right arm PICC tip enters the SVC with the tip not well visualized due to pacemaker leads.  Right jugular catheter tip overlies the neck at the cervical thoracic junction possibly and external jugular branch. This is unchanged.  IMPRESSION: Congestive heart failure with mild edema  Right arm PICC tip enters the SVC with the tip not seen due to overlying pacemaker leads  Right external jugular catheter tip remains in the lower neck unchanged from the prior study.   Electronically Signed   By: Marlan Palau M.D.   On: 04/02/2014 11:50   Dg Chest Port 1 View  03/30/2014   CLINICAL DATA:  55 year old male with a history of syncope. Report of AICD discharge  EXAM: PORTABLE CHEST - 1 VIEW  COMPARISON:  03/28/2014, fluoroscopy 02/21/2014  FINDINGS: Cardiomediastinal silhouette is similar to prior with cardiomegaly.  Central vascular congestion with right hilar opacities.  No pneumothorax.  No large pleural effusion. Retrocardiac region not well evaluated given the EKG leads, defibrillator pads, and overlying soft tissues of the chest wall.  Unchanged position of AICD generator on the left chest wall with 3 leads in place.  Right IJ tunneled central catheter has been withdrawn to the cervical internal jugular vein since the placement 02/21/2014.  IMPRESSION: Evidence of pulmonary vascular congestion/developing edema.  Cardiomegaly.  Interval withdrawal of the right IJ central venous catheter into the cervical internal jugular vein.  Overlying defibrillator pads.  Signed,  Yvone Neu.  Loreta Ave, DO  [facility]  [facility]   Electronically Signed   By: Gilmer Mor D.O.   On: 03/30/2014 11:33   Dg Foot Complete Right  04/03/2014   CLINICAL DATA:  Syncope and fall, injuring right foot 3 days ago  EXAM: RIGHT FOOT COMPLETE - 3+ VIEW  COMPARISON:  None.  FINDINGS: There is no evidence of fracture or dislocation. There is no evidence of arthropathy or other focal bone abnormality. Soft tissues are unremarkable.  IMPRESSION: Negative.   Electronically Signed   By: Ellery Plunk M.D.   On: 04/03/2014 00:51    PHYSICAL EXAM  General: NAD Neck: JVP to jaw , no thyromegaly or thyroid nodule.  Lungs: Clear to auscultation bilaterally with normal respiratory effort. CV: Nondisplaced PMI.  Heart irregular S1/S2 (frequent PVCs), no S3/S4,  no murmur.  1+ edema.  No carotid bruit.  Normal pedal pulses.  Abdomen: obese, Soft, nontender, no hepatosplenomegaly, no distention.  Neurologic: Alert and oriented x 3.  Psych: Normal affect. Extremities: No clubbing or cyanosis.  R foot erythematous (improving) and less tender to touch    ASSESSMENT AND PLAN: 54 yo with history of nonischemic cardiomyopathy, EF 10-15% by last echo and St Jude CRT-D device, chronic atrial fibrillation s/p AV nodal ablation, h/o VT, CKD, CVA and VT/VF and syncope. On chronic milrinone 0.375 mcg.   1. VT: 03/30/2014 Shock.   BMET ok from today.  - Continue po amiodarone 200 mg twice a day  - Continue ranolazine 1000 bid.  - Continue spironolactone 25 mg bid..  - Continue KDur 20 meq daily .    2. Chronic systolic CHF: EF 76-22% on 1/16 echo, nonischemic cardiomyopathy.  St Jude CRT-D device.  Not good candidate for advanced therapies, has no caregiver at home. On home milrinone at 0.375 - Volume status elevated. Increased cough. Continue torsemide 80 mg bid.   Add 2.5 mg metolazone today and tomorrow. Increase potassium to 20 meq twice a day.   - Continue home  milrinone at 0.375. 3. Atrial fibrillation: Chronic, s/p AVN ablation.  Continue warfarin. INR 1.8 per pharmacy. Goal 2.0-3.0 for INR 4. CKD: Creatinine stable at his baseline.  5. Foot pain: No fracture on imaging.  Had prednisone for 2 days.   6. Hyponatremia:  7. Deconditioning. Rehab services much appreciated.     CLEGG,AMY NP-C  04/15/2014 3:46 PM   Patient seen and examined with Tonye Becket, NP. We discussed all aspects of the encounter. I agree with the assessment and plan as stated above.   He is mildly volume overloaded. Agree with 2 days of metolazone. Daily BMET for 2-3 days.  We will follow.   Truman Hayward 6:46 PM

## 2014-04-15 NOTE — Progress Notes (Signed)
Occupational Therapy Session Note  Patient Details  Name: Raymond [name] MRN: [id number] Date of Birth: Mar 24, 1960  Today's Date: 04/15/2014 OT Individual Time: 0900-1000 OT Individual Time Calculation (min): 60 min    Short Term Goals: Week 1:  OT Short Term Goal 1 (Week 1): STG=LTG due to short length of stay.   Skilled Therapeutic Interventions/Progress Updates: ADL-retraining with focus on AE training, dynamic standing balance, safety awareness, toilet transfer and toileting.   Pt received supine in bed awaiting therapist for planned BADL.   Continuous IV protocol limited bathing/dressing to sink side this session although, necessary d/t need for grooming (shaving).   Pt reported presence of clothing provided and willing to attempt dressing.    Pt requested acewrap support for right ankle while supine and was then able to perform bed mobility to sit at edge of bed with use of DME but requested assist with sit>stand d/t increased symptoms of fatigue for unknown reason.    After declaring that he had "walked down the hall with p.t." during weekend therapy, pt agreed to ambulate to w/c at sink (approx 5') but required steadying assist while using SPC d/t return of right ankle pain.   Pt bathed at sink with setup assist, standing to wash peri-area briefly although unable to wash buttocks during this session.   After objecting to alternate dressing method requested by therapist (sink side, seated in w/c), pt laced pant legs while seated and stood, requesting therapist assist with pulling up pants.   Pt donned shirt seated with setup from RN to lace IV and min assist to pull shirt over trunk.   Pt then objected to continued wearing of clothing stating he feared he would not be able to toilet because his pants were too tight and he felt to restricted by Tee-shirt (also too tight).    Pt was assisted with undressing to return to gown for comfort.   Just prior to end of session pt requested use of BSC and  toileted with assist for hygiene d/t limited control of environment compared to method used at his home.    OT offered review of performance with B & D and advised use of reacher to reduce fall risk and extend reach.   Pt verbalized understanding and accepted reacher.   Pt returned to w/c at end of session and was left seated with all needs within reach.     Therapy Documentation Precautions:  Precautions Precautions: Fall, ICD/Pacemaker, Other (comment) Precaution Comments: pacemaker placed 5 years ago; L side hemiplegia: L arm weaker than L leg Restrictions Weight Bearing Restrictions: No   Pain: 2/10, right ankle    See FIM for current functional status  Therapy/Group: Individual Therapy   Second session: Time:1100-1200 Time Calculation (min):  60 min  Pain Assessment: No/denies pain  Skilled Therapeutic Interventions: Therapeutic activity with emphasis on discharge planning, re-ed on homemaking, w/c management and functional mobility with re-ed on general strengthening of UE using thera-band.    After review of goals, pt reports that although he no longer uses stove, he continues to access refrigerator, toaster oven and microwave to heat/cook convenience foods (lean pockets, soups, canned pasta meals).   OT advised revision to goals to include simple meal prep which pt acknowledged as a meaningful activity.    Pt was then escorted off ward to outdoor setting to improve motivation for therapy and performed UE HEP with therapist provided instructional cues as reinforcement on technique to anchor thera-band during exercising.  Pt completed 5 exercises with setup assist and was escorted back to his room with min vc to manage IV while being pushed in his w/c.   Pt left in w/c at end of session as meal tray and family members arrived for visit.   See FIM for current functional status  Therapy/Group: Individual Therapy  Raymond [name] 04/15/2014, 12:53 PM

## 2014-04-15 NOTE — Progress Notes (Signed)
ANTICOAGULATION CONSULT NOTE - Follow Up Consult  Pharmacy Consult for Coumadin Indication: atrial fibrillation  Allergies  Allergen Reactions  . Valsartan Cough    Patient Measurements: Height: 5\' 9"  (175.3 cm) Weight: 264 lb 12.4 oz (120.1 kg) IBW/kg (Calculated) : 70.7 Heparin Dosing Weight:   Vital Signs: Temp: 98.6 F (37 C) (03/28 0619) Temp Source: Oral (03/28 0619)  Labs:  Recent Labs  04/13/14 0520 04/14/14 0429 04/15/14 0635  HGB  --  11.8*  --   HCT  --  36.8*  --   PLT  --  377  --   LABPROT 21.1* 18.8* 21.1*  INR 1.81* 1.55* 1.81*  CREATININE 1.87* 1.80* 1.76*    Estimated Creatinine Clearance: 63.6 mL/min (by C-G formula based on Cr of 1.76).   Medications:  Scheduled:  . acyclovir  400 mg Oral q morning - 10a  . amiodarone  200 mg Oral BID  . cyclobenzaprine  10 mg Oral BID  . doxycycline  100 mg Oral Q12H  . levothyroxine  250 mcg Oral QAC breakfast  . magnesium oxide  400 mg Oral Daily  . potassium chloride  20 mEq Oral Daily  . ranolazine  1,000 mg Oral BID  . senna-docusate  2 tablet Oral QHS  . spironolactone  25 mg Oral BID  . torsemide  80 mg Oral BID  . Warfarin - Pharmacist Dosing Inpatient   Does not apply q1800    Assessment: 54yo male with AFib.  INR 1.81- moving toward goal.  No CBC today, no bleeding noted.  Pt is on Amiodarone 200mg  po BID, which may increase Coumadin effect.  Doxycycline through 4/2.    Goal of Therapy:  INR 2-3 Monitor platelets by anticoagulation protocol: Yes   Plan:  Coumadin 5mg  today Daily INR  Marisue Humble, PharmD Clinical Pharmacist [facility]

## 2014-04-15 NOTE — Progress Notes (Signed)
Physical Therapy Session Note  Patient Details  Name: Raymond [name] MRN: [id number] Date of Birth: 11-07-1960  Today's Date: 04/15/2014 PT Individual Time: 1300-1400 PT Individual Time Calculation (min): 60 min   Short Term Goals: Week 1:  PT Short Term Goal 1 (Week 1): STGs = LTGs due to ELOS  Skilled Therapeutic Interventions/Progress Updates:   Patient received sitting in wheelchair with sister and brother in law in room but departing for session. Patient refused to attempt any form of wheelchair propulsion. Patient c/o dizziness upon reaching therapy gym that increased when bending forward to remove leg rests. Transitioned to NuStep due to increased dizziness. Patient performed NuStep using BLE only for activity tolerance at level 3 x total of 7 min and level 1 x total of 3 min, total cues for positioning and maintaining neutral LLE. Patient stopping frequently to complain about positioning and resistance and stating he had "no muscle" in L knee. Patient eventually agreeable to attempt ambulation after stating multiple times, "I can't walk." Gait training using SPC x 25 ft + 40 ft with min guard and +2 for wheelchair follow, max verbal cues for decreased pace to improve safety. Patient instructed in performing step taps to 5" step using 2 rails but angered at therapist instructions to push up from arm rest instead of using cane to stand, therefore impulsively threw his body at steps and rapidly completed step taps in unsafe manner. Patient instructed to wait for 5 counts between step taps for safety and complied on subsequent attempt, able to perform 2 x 5 each LE with BUE support on rails. Patient returned to room and requested to return to bed. Patient performed stand pivot transfer using SPC with min guard and transferred sit > supine with supervision. Patient left semi reclined in bed with bed alarm on and needs within reach.    Therapy Documentation Precautions:  Precautions Precautions:  Fall, ICD/Pacemaker, Other (comment) Precaution Comments: pacemaker placed 5 years ago; L side hemiplegia: L arm weaker than L leg Restrictions Weight Bearing Restrictions: No Pain:  Denies pain Locomotion : Ambulation Ambulation/Gait Assistance: 1: +2 Total assist;4: Min guard (+2 for w/c follow)   See FIM for current functional status  Therapy/Group: Individual Therapy  Kerney Elbe 04/15/2014, 3:18 PM

## 2014-04-16 ENCOUNTER — Encounter (HOSPITAL_COMMUNITY): Payer: Medicare Other

## 2014-04-16 ENCOUNTER — Inpatient Hospital Stay (HOSPITAL_COMMUNITY): Payer: Medicare Other

## 2014-04-16 LAB — BASIC METABOLIC PANEL
Anion gap: 7 (ref 5–15)
BUN: 30 mg/dL — ABNORMAL HIGH (ref 6–23)
CALCIUM: 8.9 mg/dL (ref 8.4–10.5)
CHLORIDE: 101 mmol/L (ref 96–112)
CO2: 23 mmol/L (ref 19–32)
Creatinine, Ser: 1.65 mg/dL — ABNORMAL HIGH (ref 0.50–1.35)
GFR calc non Af Amer: 47 mL/min — ABNORMAL LOW (ref >90)
GFR, EST AFRICAN AMERICAN: 54 mL/min — AB (ref >90)
GLUCOSE: 107 mg/dL — AB (ref 70–99)
Potassium: 4.9 mmol/L (ref 3.5–5.1)
SODIUM: 131 mmol/L — AB (ref 135–145)

## 2014-04-16 LAB — PROTIME-INR
INR: 2.13 — AB (ref 0.00–1.49)
Prothrombin Time: 24 seconds — ABNORMAL HIGH (ref 11.6–15.2)

## 2014-04-16 MED ORDER — PREDNISONE 10 MG PO TABS
10.0000 mg | ORAL_TABLET | Freq: Two times a day (BID) | ORAL | Status: DC
  Administered 2014-04-16 – 2014-04-17 (×3): 10 mg via ORAL
  Filled 2014-04-16 (×7): qty 1

## 2014-04-16 MED ORDER — FUROSEMIDE 10 MG/ML IJ SOLN
80.0000 mg | Freq: Two times a day (BID) | INTRAMUSCULAR | Status: AC
  Administered 2014-04-16: 80 mg via INTRAVENOUS
  Filled 2014-04-16 (×2): qty 8

## 2014-04-16 MED ORDER — WARFARIN SODIUM 5 MG PO TABS
5.0000 mg | ORAL_TABLET | Freq: Once | ORAL | Status: AC
  Administered 2014-04-16: 5 mg via ORAL
  Filled 2014-04-16: qty 1

## 2014-04-16 MED ORDER — ALTEPLASE 2 MG IJ SOLR
2.0000 mg | Freq: Once | INTRAMUSCULAR | Status: AC
  Administered 2014-04-16: 2 mg
  Filled 2014-04-16: qty 2

## 2014-04-16 NOTE — Patient Care Conference (Signed)
Inpatient RehabilitationTeam Conference and Plan of Care Update Date: 04/16/2014   Time: 1:00 PM    Patient Name: Raymond [name]      Medical Record Number: [id number]  Date of Birth: [birth year] Sex: Male         Room/Bed: 4M11C/4M11C-01 Payor Info: Payor: MEDICARE / Plan: MEDICARE PART A AND B / Product Type: *No Product type* /    Admitting Diagnosis: vfib tach chf  Admit Date/Time:  04/09/2014  4:06 PM Admission Comments: No comment available   Primary Diagnosis:  Physical debility Principal Problem: Physical debility  Patient Active Problem List   Diagnosis Date Noted  . Slow transit constipation   . Physical debility 04/09/2014  . Left hemiparesis 04/09/2014  . Syncope and collapse 03/30/2014  . VT (ventricular tachycardia) 03/30/2014  . ICD discharge 03/30/2014  . Dyslexia 02/27/2014  . DNR (do not resuscitate) 02/22/2014  . Palliative care encounter 02/20/2014  . Shortness of breath 02/20/2014  . Sleep disturbance 02/20/2014  . Acute on chronic renal failure   . Cardiogenic shock   . Acute on chronic systolic CHF (congestive heart failure) 02/14/2014  . Noncompliance 05/02/2013  . Encounter for therapeutic drug monitoring 04/12/2013  . NSVT- Amiodarone added Feb 2016 02/28/2013  . Hypothyroid-elevated TSH on Amiodarone 02/27/2013  . Chronic anticoagulation 02/27/2013  . PAF-RFA  01/07/13 02/02/2013  . Acute on chronic clinical systolic heart failure 01/07/2013  . CKD (chronic kidney disease), stage III 08/09/2012  . 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 Non-ischemic cardiomyopathy   . Chronic systolic heart failure   . Morbid obesity-BMI43   . Biventricular implantable cardioverter-defibrillator in situ 06/26/2009  . Hypertensive heart disease     Expected Discharge Date: Expected Discharge Date: 04/20/14  Team Members Present: Physician leading conference: Dr. Faith Rogue Social Worker Present: Amada Jupiter,  LCSW Nurse Present: Carlean Purl, RN PT Present: Karolee Stamps, Talitha Givens, PT OT Present: Donzetta Kohut, OT;Other (comment) Johnsie Cancel, OT) SLP Present: Feliberto Gottron, SLP PPS Coordinator present : Tora Duck, RN, CRRN     Current Status/Progress Goal Weekly Team Focus  Medical   deconditioning, hx of left hemiparesis. poor endurance, gout right ankle. non-compliant, anxiety/behavior mgt  improve exercise tolerance  activity tolerance, CV issues   Bowel/Bladder   Continent to bowel and bladder.  To continue continent to bowel and bladder.  To monitor bowel and bladder function Q shift.   Swallow/Nutrition/ Hydration             ADL's   Setup-Min A for bathing and dressing d/t IV, Supervision for transfers, Min A for toileting   Overall Mod I for BADL and homemaking as feasible to simulate home environment  Dynamic standing balance, adapted bathing/dressing, transfers, endurance   Mobility   Min Guard to MinA for upright mobility, limited by decreased endurance and R ankle pain  mod (I) overall w/ ambulation up to 30', supervision for car transfers  safety with mobility, activity tolerance, stair training   Communication             Safety/Cognition/ Behavioral Observations     To keep pt. free from fall during his stay in rehab.      Pain   No complain of pain.  To keep pain levels less than 3,on scale 1 to 10.  To monitor pain levels Q 2 hrs and PRN.   Skin   Skin dry and intact.  To keep skin free of  presure ulcers.  To monitor skin condition Q shift.    Rehab Goals Patient on target to meet rehab goals: Yes *See Care Plan and progress notes for long and short-term goals.  Barriers to Discharge: pain ,behavior, baseline cognition    Possible Resolutions to Barriers:  improve cooperation, behavior    Discharge Planning/Teaching Needs:  home alone with intermittent assist of girlfriend and family members      Team Discussion:  Difficult pt/some conflicts with  male PTs. Poor endurance,fatigue,gout in right ankle - may need bracing/ casting?  Impulsive and poor safety awareness.  Ambulates up to 40'. Often rejects any attempts from staff to offer teaching.  Anticipate reaching mod i goals.  Revisions to Treatment Plan:  None   Continued Need for Acute Rehabilitation Level of Care: The patient requires daily medical management by a physician with specialized training in physical medicine and rehabilitation for the following conditions: Daily direction of a multidisciplinary physical rehabilitation program to ensure safe treatment while eliciting the highest outcome that is of practical value to the patient.: Yes Daily medical management of patient stability for increased activity during participation in an intensive rehabilitation regime.: Yes Daily analysis of laboratory values and/or radiology reports with any subsequent need for medication adjustment of medical intervention for : Neurological problems;Post surgical problems;Pulmonary problems;Cardiac problems  Modene Andy 04/16/2014, 2:22 PM

## 2014-04-16 NOTE — Progress Notes (Signed)
Occupational Therapy Session Note  Patient Details  Name: Raymond [name] MRN: [id number] Date of Birth: 10/07/1960  Today's Date: 04/16/2014 OT Individual Time: 0905-1000 OT Individual Time Calculation (min): 55 min    Short Term Goals: Week 1:  OT Short Term Goal 1 (Week 1): STG=LTG due to short length of stay.   Skilled Therapeutic Interventions/Progress Updates: ADL-retraining with focus on improved dynamic standing balance, safety awareness, activity tolerance, and adapted bathing/dressing skills.   Pt received supine in bed and receptive for planned session although reporting urgent need to toilet.   After setup to apply ace wrap to right ankle, pt rose to edge of bed with min assist and ambulated to toilet with standby assist to manage IV pole.   Pt able to transfer to toilet unassisted but could not complete hygiene d/t architectural barriers dissimilar to home environment; pt reports prior habit of leaning on cabinet and supporting with left UE while wiping after BM.   Pt then returned to w/c placed at sink side to bathe seated and performed essential thoroughness during task however requested assist with cleansing under skin folds.  Pt reports routine of bathing supine/reclined and dressing at edge of bed as most effective was able to don shirt with greater ease with IV placement at chest vs arm.   Primarly limitations noted during treatment relate to  Behavior (rigid, not receptive to alternate methods of self-care, poor frustration tolerance), endurance, standing tolerance, and impulsive/reckless movements during mobility and transfers.          Therapy Documentation Precautions:  Precautions Precautions: Fall, ICD/Pacemaker, Other (comment) Precaution Comments: pacemaker placed 5 years ago; L side hemiplegia: L arm weaker than L leg Restrictions Weight Bearing Restrictions: No  Vital Signs: Therapy Vitals Pulse Rate: 70 Resp: (!) 22 BP: 111/69 mmHg Patient Position (if  appropriate): Sitting Oxygen Therapy SpO2: 96 % O2 Device: Not Delivered   Pain: 5/10, right ankle    See FIM for current functional status  Therapy/Group: Individual Therapy   Second session: Time: 1100-1205 Time Calculation (min):  65 min  Pain Assessment: 5/10, right ankle  Skilled Therapeutic Interventions: Therapeutic activity (45 min) with focus on homemaking (meal prep), standing tolerance, functional mobility using SPC, UE strengthening, and safety awareness.   Pt receptive for planned activity of meal prep at kitchen.   After review of prior homemaking skills, pt reports accessing microwave, refrigerator and toaster oven only and no longer uses his stove/oven.   Pt reports poor diet and states he often eats only 1 meal/day, at night.  Pt would not elaborate on homemaking skills beyond stating that he manages as he pleases and does not make his bed; per social worker, home therapies have reported living environment as being "a disaster" however pt does not appreciate discussing deficits with his performance in homemaking tasks.     Pt was instructed on planned activity to prepare ravioli from a can using cabinets, countertop, standard bowl and utensils, microwave and stool, as needed.   With initial instructional cues and assist to manage IV pole, pt ambulated to cabinets using SPC but quickly fatigued after collecting plate and canned food (approx 30 seconds) and urgently requested a chair to sit.   OT provided swivel stool and pt then proceeded through task seated on stool with poor problem-solving, often stopping progress only to direct therapist to reposition items and manage his gown.    Following meal prep, pt was escorted to outdoor area to end session performing  HEP (therex 15 min).   Pt completed 20 reps of unilateral chest press and bicep curl before reporting urgent need to urinate.   OT immediately escorted pt to public bathroom in lobby where pt stood to urinate with setup  assist to manage clothing (gowns) and IV pole.   Pt completed hand hygiene and was returned to his room at end of session with setup for noon meal and call light within reach.   See FIM for current functional status  Therapy/Group: Individual Therapy  Cavin Longman 04/16/2014, 12:42 PM

## 2014-04-16 NOTE — Progress Notes (Signed)
Patient ID: Raymond [name], male   DOB: 15-Jul-1960, 54 y.o.   MRN: [id number]   54 yo with history of nonischemic cardiomyopathy, EF 10-15% by last echo and St Jude CRT-D device, chronic atrial fibrillation s/p AV nodal ablation, h/o VT, CKD, CVA presented with VT/VF and syncope.  At last office visit, torsemide was increased and amiodarone was decreased to 200 daily.  He was hypokalemic at admission.    Admitted to [facility] after he was shocked for VT on the evening of 3/12 and again on the evening of 3/13.  Seen by Dr Graciela Husbands and started on ranolazine.  Remains on milrinone at 0.375. Creatinine stable.  He was transferred to inpatient rehab on March 22nd on milrinone at 0.375 mcg. Discharge weight was 265 pounds.   Making slow progress on rehab. Rehab limited by right ankle pain.    IV lasix restarted yesterday. Coughing. Says he is SOB. I reweighed him myself. Weight 269. Which is up about 5 pounds from baseline.  04/15/2014: K 4.3 Creatinine 1.76  .  Scheduled Meds: . acyclovir  400 mg Oral q morning - 10a  . amiodarone  200 mg Oral BID  . cyclobenzaprine  10 mg Oral BID  . doxycycline  100 mg Oral Q12H  . levothyroxine  250 mcg Oral QAC breakfast  . magnesium oxide  400 mg Oral Daily  . potassium chloride  20 mEq Oral BID  . ranolazine  1,000 mg Oral BID  . senna-docusate  2 tablet Oral QHS  . spironolactone  25 mg Oral BID  . torsemide  80 mg Oral BID  . warfarin  5 mg Oral ONCE-1800  . Warfarin - Pharmacist Dosing Inpatient   Does not apply q1800   Continuous Infusions: . milrinone 0.375 mcg/kg/min (04/16/14 0747)   PRN Meds:.acetaminophen, acetaminophen, ALPRAZolam, alum & mag hydroxide-simeth, bisacodyl, diphenhydrAMINE, guaiFENesin-codeine, nitroGLYCERIN, ondansetron **OR** ondansetron (ZOFRAN) IV, oxyCODONE-acetaminophen, sodium chloride, sorbitol   Filed Vitals:   04/16/14 0410 04/16/14 0644 04/16/14 0900 04/16/14 1017  BP: 106/77  159/139 112/77  Pulse: 83  64 69  Temp:  97.7 F (36.5 C)     TempSrc: Oral     Resp: 20   22  Height:      Weight:  122.4 kg (269 lb 13.5 oz)    SpO2: 96%   96%    Intake/Output Summary (Last 24 hours) at 04/16/14 1045 Last data filed at 04/16/14 0846  Gross per 24 hour  Intake    582 ml  Output   1525 ml  Net   -943 ml    LABS: Basic Metabolic Panel:  Recent Labs  38/17/71 0635 04/16/14 0543  NA 132* 131*  K 4.3 4.9  CL 96 101  CO2 28 23  GLUCOSE 136* 107*  BUN 28* 30*  CREATININE 1.76* 1.65*  CALCIUM 9.1 8.9   Liver Function Tests: No results for input(s): AST, ALT, ALKPHOS, BILITOT, PROT, ALBUMIN in the last 72 hours. No results for input(s): LIPASE, AMYLASE in the last 72 hours. CBC:  Recent Labs  04/14/14 0429  WBC 9.4  HGB 11.8*  HCT 36.8*  MCV 92.5  PLT 377   Cardiac Enzymes: No results for input(s): CKTOTAL, CKMB, CKMBINDEX, TROPONINI in the last 72 hours. BNP: Invalid input(s): POCBNP D-Dimer: No results for input(s): DDIMER in the last 72 hours. Hemoglobin A1C: No results for input(s): HGBA1C in the last 72 hours. Fasting Lipid Panel: No results for input(s): CHOL, HDL, LDLCALC, TRIG, CHOLHDL, LDLDIRECT  in the last 72 hours. Thyroid Function Tests: No results for input(s): TSH, T4TOTAL, T3FREE, THYROIDAB in the last 72 hours.  Invalid input(s): FREET3 Anemia Panel: No results for input(s): VITAMINB12, FOLATE, FERRITIN, TIBC, IRON, RETICCTPCT in the last 72 hours.  RADIOLOGY: Dg Chest 2 View  03/28/2014   CLINICAL DATA:  Shortness of breath since last night.  EXAM: CHEST  2 VIEW  COMPARISON:  02/18/2014  FINDINGS: Pacer/AICD device.  A right internal jugular line terminates over the internal jugular vein. Midline trachea. Moderate cardiomegaly. No pleural effusion or pneumothorax. Mild interstitial edema. No lobar consolidation.  IMPRESSION: Mild congestive heart failure.  Right internal jugular line with tip projecting over the low right neck. Presuming low SVC position is  desired, this should be advanced approximately 11 cm.   Electronically Signed   By: Jeronimo Greaves M.D.   On: 03/28/2014 20:47   Dg Chest Port 1 View  04/02/2014   CLINICAL DATA:  Central line placement  EXAM: PORTABLE CHEST - 1 VIEW  COMPARISON:  03/30/2014  FINDINGS: Marked cardiac enlargement. Pulmonary vascular congestion and mild interstitial edema is unchanged. No significant effusion.  Right arm PICC tip enters the SVC with the tip not well visualized due to pacemaker leads.  Right jugular catheter tip overlies the neck at the cervical thoracic junction possibly and external jugular branch. This is unchanged.  IMPRESSION: Congestive heart failure with mild edema  Right arm PICC tip enters the SVC with the tip not seen due to overlying pacemaker leads  Right external jugular catheter tip remains in the lower neck unchanged from the prior study.   Electronically Signed   By: Marlan Palau M.D.   On: 04/02/2014 11:50   Dg Chest Port 1 View  03/30/2014   CLINICAL DATA:  54 year old male with a history of syncope. Report of AICD discharge  EXAM: PORTABLE CHEST - 1 VIEW  COMPARISON:  03/28/2014, fluoroscopy 02/21/2014  FINDINGS: Cardiomediastinal silhouette is similar to prior with cardiomegaly.  Central vascular congestion with right hilar opacities.  No pneumothorax.  No large pleural effusion. Retrocardiac region not well evaluated given the EKG leads, defibrillator pads, and overlying soft tissues of the chest wall.  Unchanged position of AICD generator on the left chest wall with 3 leads in place.  Right IJ tunneled central catheter has been withdrawn to the cervical internal jugular vein since the placement 02/21/2014.  IMPRESSION: Evidence of pulmonary vascular congestion/developing edema.  Cardiomegaly.  Interval withdrawal of the right IJ central venous catheter into the cervical internal jugular vein.  Overlying defibrillator pads.  Signed,  Yvone Neu. Loreta Ave, DO  [facility]  [facility]   Electronically Signed   By: Gilmer Mor D.O.   On: 03/30/2014 11:33   Dg Foot Complete Right  04/03/2014   CLINICAL DATA:  Syncope and fall, injuring right foot 3 days ago  EXAM: RIGHT FOOT COMPLETE - 3+ VIEW  COMPARISON:  None.  FINDINGS: There is no evidence of fracture or dislocation. There is no evidence of arthropathy or other focal bone abnormality. Soft tissues are unremarkable.  IMPRESSION: Negative.   Electronically Signed   By: Ellery Plunk M.D.   On: 04/03/2014 00:51    PHYSICAL EXAM  General: NAD Neck: JVP to jaw , no thyromegaly or thyroid nodule.  Lungs: Clear to auscultation bilaterally with normal respiratory effort. CV: Nondisplaced PMI.  Heart irregular S1/S2 (frequent PVCs), no S3/S4, no murmur.  1+ edema.  No carotid bruit.  Normal pedal pulses.  Abdomen: obese, Soft, nontender, no hepatosplenomegaly, no distention.  Neurologic: Alert and oriented x 3.  Psych: Normal affect. Extremities: No clubbing or cyanosis.  R foot erythematous (improving) and less tender to touch    ASSESSMENT AND PLAN: 54 yo with history of nonischemic cardiomyopathy, EF 10-15% by last echo and St Jude CRT-D device, chronic atrial fibrillation s/p AV nodal ablation, h/o VT, CKD, CVA and VT/VF and syncope. On chronic milrinone 0.375 mcg.   1. VT: 03/30/2014 Shock.   BMET ok from today.  - Continue po amiodarone 200 mg twice a day  - Continue ranolazine 1000 bid.  - Continue spironolactone 25 mg bid..  - Continue KDur 20 meq daily .    2. Chronic systolic CHF: EF 08-65% on 1/16 echo, nonischemic cardiomyopathy.  St Jude CRT-D device.  Not good candidate for advanced therapies, has no caregiver at home. On home milrinone at 0.375 - Volume status elevated. Increased cough. Continue torsemide 80 mg bid. Metolazone added with only mild response. Will give IV lasix - Continue home milrinone at 0.375. 3. Atrial fibrillation: Chronic, s/p AVN ablation.   Continue warfarin per pharmacy. Goal 2.0-3.0 for INR 4. CKD: Creatinine stable at his baseline.  5. Foot pain: No fracture on imaging.  Had prednisone for 2 days.   6. Hyponatremia:  7. Deconditioning. Rehab services much appreciated.   He is mildly volume overloaded. No major response to metolazone. Will give him some IV lasix. ADaily BMET for 2-3 days.  We will follow.   Joanna Borawski,MD 10:45 AM

## 2014-04-16 NOTE — Progress Notes (Signed)
Physical Therapy Session Note  Patient Details  Name: Raymond [name] MRN: [id number] Date of Birth: March 18, 1960  Today's Date: 04/16/2014 PT Individual Time: 1600-1700 PT Individual Time Calculation (min): 60 min   Short Term Goals: Week 1:  PT Short Term Goal 1 (Week 1): STGs = LTGs due to ELOS  Skilled Therapeutic Interventions/Progress Updates:    Pt received supine in bed, agreeable to participate in therapy. Session focused on general strengthening/endurance, ambulation, discharge planning. Pt transferred w/c<> bed w/ use of SPC and close S w/ assist to manage IV lines. Discussed mobility at discharge with pt indicating he was interested in pursuing power mobility. Pt would benefit from use of Group 2 Power mobility device due to significantly decreased cardiorespiratory endurance and pre-morbid hemiplegia due to prior CVA limiting ability to utilize manual wheelchair. Due to pt's home county and insurance, pt must go through [facility] to obtain power mobility. Will initiate process with social worker tomorrow as she had left for the day by pt's PT session. Instructed pt in Nustep 12' on L3 w/ BUE/BLE for general strengthening and endurance with pt requiring rest breaks every few minutes due to decreased endurance. Instructed pt in point to point ambulation 30' at a time x4 w/ [facility] and close S-Min Guard A w/ assist primarily for managing IV pole and for safety during turns. Session ended in pt's room, where pt was left supine in bed w/ all needs within reach.    Therapy Documentation Precautions:  Precautions Precautions: Fall, ICD/Pacemaker, Other (comment) Precaution Comments: pacemaker placed 5 years ago; L side hemiplegia: L arm weaker than L leg Restrictions Weight Bearing Restrictions: No General:   Vital Signs: Therapy Vitals Temp: 97.7 F (36.5 C) Temp Source: Oral Pulse Rate: 83 Resp: 20 BP: 106/77 mmHg Patient Position (if appropriate): Lying Oxygen  Therapy SpO2: 96 % O2 Device: Not Delivered Pain:   Mobility:   Locomotion :    Trunk/Postural Assessment :    Balance:   Exercises:   Other Treatments:    See FIM for current functional status  Therapy/Group: Individual Therapy  Hosie Spangle  Hosie Spangle, PT, DPT 04/16/2014, 7:42 AM

## 2014-04-16 NOTE — Progress Notes (Signed)
[facility]ICINE [facility] RE[facility]ABILITATION     PROGRESS NOTE    Subjective/Complaints: Having ongoing cough. Worse when he moves. Had a restless night.  Objective: Vital Signs: Blood pressure 106/77, pulse 83, temperature 97.7 F (36.5 C), temperature source Oral, resp. rate 20, height  (1.753 m), weight 122.4 kg (269 lb 13.5 oz), SpO2 96 %. No results found.  Recent Labs  04/14/14 0429  WBC 9.4  HGB 11.8*  HCT 36.8*  PLT 377    Recent Labs  04/15/14 0635 04/16/14 0543  NA 132* 131*  K 4.3 4.9  CL 96 101  GLUCOSE 136* 107*  BUN 28* 30*  CREATININE 1.76* 1.65*  CALCIUM 9.1 8.9   CBG (last 3)   Recent Labs  04/13/14 1645  GLUCAP 120*    Wt Readings from Last 3 Encounters:  04/16/14 122.4 kg (269 lb 13.5 oz)  04/09/14 120.611 kg (265 lb 14.4 oz)  03/27/14 122.925 kg (271 lb)    Physical Exam:  Constitutional: He is oriented to person, place, and time. He appears well-developed and well-nourished.  HENT: dentition fair. Mucosa moist Head: Normocephalic and atraumatic.  Eyes: Conjunctivae and EOM are normal. Pupils are equal, round, and reactive to light.  Neck: No tracheal deviation present. No thyromegaly present.  Cardiovascular: Normal rate and regular rhythm.    No murmur heard. Respiratory: Effort normal and breath sounds normal. No respiratory distress. An occasional upper airway sound during expiration.  GI: Soft. Bowel sounds are normal. He exhibits no distension. There is no tenderness.  Musculoskeletal: He exhibits no edema or tenderness.  Right foot with less erythema. No pain with ROM. mild pain with palpation over the mid foot. Neurological: He is alert and oriented to person, place, and time.  Slow measured speech. He was able to follow basic commands without difficulty. Left hemiparesis UE> LE with sensory deficits. LUE 2+ to 3-/5. LLE: 3/5 HF, 4-KE and 2+ aDF, 3+apf, RUE and RLE 4 to 4+/5, some limitations with movement of  right foot. Mild left facial droop. Speech clear.  Skin: Skin is warm and dry.  No breakdown Psychiatric: anxious.  His speech is delayed. He is slowed.    Assessment/Plan: 1. Functional deficits secondary to debility after chf/cellulitis which require 3+ hours per day of interdisciplinary therapy in a comprehensive inpatient rehab setting. Physiatrist is providing close team supervision and 24 hour management of active medical problems listed below. Physiatrist and rehab team continue to assess barriers to discharge/monitor patient progress toward functional and medical goals. FIM: FIM - Bathing Bathing Steps Patient Completed: Chest, Left Arm, Abdomen, Front perineal area, Right upper leg, Left upper leg Bathing: 3: Mod-Patient completes 5-7 62f 10 parts or 50-74%  FIM - Upper Body Dressing/Undressing Upper body dressing/undressing steps patient completed: Thread/unthread right sleeve of pullover shirt/dresss, Thread/unthread left sleeve of pullover shirt/dress, Put head through opening of pull over shirt/dress Upper body dressing/undressing: 4: Min-Patient completed 75 plus % of tasks FIM - Lower Body Dressing/Undressing Lower body dressing/undressing steps patient completed: Thread/unthread right pants leg, Thread/unthread left pants leg, Don/Doff right shoe, Don/Doff left shoe Lower body dressing/undressing: 4: Min-Patient completed 75 plus % of tasks  FIM - Toileting Toileting steps completed by patient: Adjust clothing prior to toileting Toileting: 1: Total-Patient completed zero steps, helper did all 3  FIM - Diplomatic Services operational officer Devices: Psychiatrist Transfers: 4-To toilet/BSC: Min A (steadying Pt. > 75%), 4-From toilet/BSC: Min A (steadying Pt. > 75%)  FIM -  Bed/Chair Transport planner Devices: Teacher, music: 5: Sit > Supine: Supervision (verbal cues/safety issues), 4: Bed > Chair or W/C: Min A (steadying Pt. >  75%), 4: Chair or W/C > Bed: Min A (steadying Pt. > 75%)  FIM - Locomotion: Wheelchair Distance: 20 Locomotion: Wheelchair: 1: Total Assistance/staff pushes wheelchair (Pt<25%) FIM - Locomotion: Ambulation Locomotion: Ambulation Assistive Devices: Emergency planning/management officer Ambulation/Gait Assistance: 1: +2 Total assist, 4: Min guard (+2 for w/c follow) Locomotion: Ambulation: 1: Two helpers  Comprehension Comprehension Mode: Auditory Comprehension: 5-Understands complex 90% of the time/Cues < 10% of the time  Expression Expression Mode: Verbal Expression: 5-Expresses complex 90% of the time/cues < 10% of the time  Social Interaction Social Interaction: 4-Interacts appropriately 75 - 89% of the time - Needs redirection for appropriate language or to initiate interaction.  Problem Solving Problem Solving: 4-Solves basic 75 - 89% of the time/requires cueing 10 - 24% of the time  Memory Memory: 5-Recognizes or recalls 90% of the time/requires cueing < 10% of the time Medical Problem List and Plan: 1. Functional deficits secondary to debility after multiple medical issues/chf. Right foot cellulitis 2. DVT Prophylaxis/Anticoagulation: Pharmaceutical: Coumadin 3. Pain Management: will continue flexeril and oxycodone prn for pain.  4. Anxiety/Mood: continue xanax prn. Team to provide ego support and encouragement. LCSW to follow for evaluation and support.  5. Neuropsych: This patient is capable of making decisions on his own behalf. 6. Skin/Wound Care: Routine pressure relief measures. Maintain adequate nutritional status. Offer supplements  7. Fluids/Electrolytes/Nutrition: Needs to limit water/ice intake to avoid overload and hyponatremia.   -potassium bid  8. 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 On home milrinone as 0.33mcg/kg/min, demadex 80 mg bid, spironolactone 25 mg  bid. Not a good cadidate for advance therapies per cardiology.    -diuretic/volume mgt per cardiology (weight up today, 122kg) 9. VT/NSVT:  . To continue ranolazine, amiodarone bid, kdur 44mq bid (hold) and mag ox daily. 10. A fib: Heart rate controlled. On warfarin.  11. H/o CVA with left hemiparesis 12. CKD: Monitor lytes daily. Renal status at baseline 13. Leukocytosis: resolved  -no clinical signs of infection  -remains on doxycycline 14. Right ankle pain: Uric Acid, 9.4  -ice  -gave prednisone x 2 doses with improvement   . LOS (Days) 7 A FACE TO FACE EVALUATION WAS PERFORMED  SWARTZ,ZACHARY T 04/16/2014 8:30 AM

## 2014-04-16 NOTE — Progress Notes (Signed)
Social Work Patient ID: Raymond [name], male   DOB: February 04, 1960, 54 y.o.   MRN: [id number]  Anselm Pancoast, LCSW Social Worker Signed  Patient Care Conference 04/16/2014  2:22 PM    Expand All Collapse All   Inpatient RehabilitationTeam Conference and Plan of Care Update Date: 04/16/2014   Time: 1:00 PM     Patient Name: Raymond [name]      Medical Record Number: [id number]  Date of Birth: February 08, 1960 Sex: Male         Room/Bed: 4M11C/4M11C-01 Payor Info: Payor: MEDICARE / Plan: MEDICARE PART A AND B / Product Type: *No Product type* /    Admitting Diagnosis: vfib tach chf   Admit Date/Time:  04/09/2014  4:06 PM Admission Comments: No comment available   Primary Diagnosis:  Physical debility Principal Problem: Physical debility    Patient Active Problem List     Diagnosis  Date Noted   .  Slow transit constipation     .  Physical debility  04/09/2014   .  Left hemiparesis  04/09/2014   .  Syncope and collapse  03/30/2014   .  VT (ventricular tachycardia)  03/30/2014   .  ICD discharge  03/30/2014   .  Dyslexia  02/27/2014   .  DNR (do not resuscitate)  02/22/2014   .  Palliative care encounter  02/20/2014   .  Shortness of breath  02/20/2014   .  Sleep disturbance  02/20/2014   .  Acute on chronic renal failure     .  Cardiogenic shock     .  Acute on chronic systolic CHF (congestive heart failure)  02/14/2014   .  Noncompliance  05/02/2013   .  Encounter for therapeutic drug monitoring  04/12/2013   .  NSVT- Amiodarone added Feb 2016  02/28/2013   .  Hypothyroid-elevated TSH on Amiodarone  02/27/2013   .  Chronic anticoagulation  02/27/2013   .  PAF-RFA  01/07/13  02/02/2013   .  Acute on chronic clinical systolic heart failure  01/07/2013   .  CKD (chronic kidney disease), stage III  08/09/2012   .  Other postablative hypothyroidism  02/15/2012   .  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  Non-ischemic cardiomyopathy     .  Chronic systolic heart failure     .  Morbid  obesity-BMI43     .  Biventricular implantable cardioverter-defibrillator in situ  06/26/2009   .  Hypertensive heart disease       Expected Discharge Date: Expected Discharge Date: 04/20/14  Team Members Present: Physician leading conference: Dr. Faith Rogue Social Worker Present: Amada Jupiter, LCSW Nurse Present: Carlean Purl, RN PT Present: Karolee Stamps, Talitha Givens, PT OT Present: Donzetta Kohut, OT;Other (comment) Johnsie Cancel, OT) SLP Present: Feliberto Gottron, SLP PPS Coordinator present : Tora Duck, RN, CRRN        Current Status/Progress  Goal  Weekly Team Focus   Medical     deconditioning, hx of left hemiparesis. poor endurance, gout right ankle. non-compliant, anxiety/behavior mgt  improve exercise tolerance  activity tolerance, CV issues   Bowel/Bladder     Continent to bowel and bladder.  To continue continent to bowel and bladder.   To monitor bowel and bladder function Q shift.    Swallow/Nutrition/ Hydration               ADL's     Setup-Min A for bathing and dressing  d/t IV, Supervision for transfers, Min A for toileting   Overall Mod I for BADL and homemaking as feasible to simulate home environment  Dynamic standing balance, adapted bathing/dressing, transfers, endurance   Mobility     Min Guard to MinA for upright mobility, limited by decreased endurance and R ankle pain  mod (I) overall w/ ambulation up to 30', supervision for car transfers  safety with mobility, activity tolerance, stair training    Communication               Safety/Cognition/ Behavioral Observations       To keep pt. free from fall during his stay in rehab.       Pain     No complain of pain.  To keep pain levels less than 3,on scale 1 to 10.  To monitor pain levels Q 2 hrs and PRN.    Skin     Skin dry and intact.  To keep skin free of presure ulcers.   To monitor skin condition Q shift.     Rehab Goals Patient on target to meet rehab goals: Yes *See Care Plan and progress  notes for long and short-term goals.    Barriers to Discharge:  pain ,behavior, baseline cognition      Possible Resolutions to Barriers:   improve cooperation, behavior     Discharge Planning/Teaching Needs:   home alone with intermittent assist of girlfriend and family members       Team Discussion:    Difficult pt/some conflicts with male PTs. Poor endurance,fatigue,gout in right ankle - may need bracing/ casting?  Impulsive and poor safety awareness.  Ambulates up to 40'. Often rejects any attempts from staff to offer teaching.  Anticipate reaching mod i goals.   Revisions to Treatment Plan:    None    Continued Need for Acute Rehabilitation Level of Care: The patient requires daily medical management by a physician with specialized training in physical medicine and rehabilitation for the following conditions: Daily direction of a multidisciplinary physical rehabilitation program to ensure safe treatment while eliciting the highest outcome that is of practical value to the patient.: Yes Daily medical management of patient stability for increased activity during participation in an intensive rehabilitation regime.: Yes Daily analysis of laboratory values and/or radiology reports with any subsequent need for medication adjustment of medical intervention for : Neurological problems;Post surgical problems;Pulmonary problems;Cardiac problems  Aaira Oestreicher 04/16/2014, 2:22 PM                 Anselm Pancoast, LCSW Social Worker Signed  Patient Care Conference 04/16/2014  2:22 PM    Expand All Collapse All   Inpatient RehabilitationTeam Conference and Plan of Care Update Date: 04/16/2014   Time: 1:00 PM     Patient Name: Raymond [name]      Medical Record Number: [id number]  Date of Birth: 03-12-1960 Sex: Male         Room/Bed: 4M11C/4M11C-01 Payor Info: Payor: MEDICARE / Plan: MEDICARE PART A AND B / Product Type: *No Product type* /    Admitting Diagnosis: vfib tach chf   Admit  Date/Time:  04/09/2014  4:06 PM Admission Comments: No comment available   Primary Diagnosis:  Physical debility Principal Problem: Physical debility    Patient Active Problem List     Diagnosis  Date Noted   .  Slow transit constipation     .  Physical debility  04/09/2014   .  Left hemiparesis  04/09/2014   .  Syncope and collapse  03/30/2014   .  VT (ventricular tachycardia)  03/30/2014   .  ICD discharge  03/30/2014   .  Dyslexia  02/27/2014   .  DNR (do not resuscitate)  02/22/2014   .  Palliative care encounter  02/20/2014   .  Shortness of breath  02/20/2014   .  Sleep disturbance  02/20/2014   .  Acute on chronic renal failure     .  Cardiogenic shock     .  Acute on chronic systolic CHF (congestive heart failure)  02/14/2014   .  Noncompliance  05/02/2013   .  Encounter for therapeutic drug monitoring  04/12/2013   .  NSVT- Amiodarone added Feb 2016  02/28/2013   .  Hypothyroid-elevated TSH on Amiodarone  02/27/2013   .  Chronic anticoagulation  02/27/2013   .  PAF-RFA  01/07/13  02/02/2013   .  Acute on chronic clinical systolic heart failure  01/07/2013   .  CKD (chronic kidney disease), stage III  08/09/2012   .  Other postablative hypothyroidism  02/15/2012   .  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  Non-ischemic cardiomyopathy     .  Chronic systolic heart failure     .  Morbid obesity-BMI43     .  Biventricular implantable cardioverter-defibrillator in situ  06/26/2009   .  Hypertensive heart disease       Expected Discharge Date: Expected Discharge Date: 04/20/14  Team Members Present: Physician leading conference: Dr. Faith Rogue Social Worker Present: Amada Jupiter, LCSW Nurse Present: Carlean Purl, RN PT Present: Karolee Stamps, Talitha Givens, PT OT Present: Donzetta Kohut, OT;Other (comment) Johnsie Cancel, OT) SLP Present: Feliberto Gottron, SLP PPS Coordinator present : Tora Duck, RN, CRRN        Current Status/Progress  Goal  Weekly Team Focus    Medical     deconditioning, hx of left hemiparesis. poor endurance, gout right ankle. non-compliant, anxiety/behavior mgt  improve exercise tolerance  activity tolerance, CV issues   Bowel/Bladder     Continent to bowel and bladder.  To continue continent to bowel and bladder.   To monitor bowel and bladder function Q shift.    Swallow/Nutrition/ Hydration               ADL's     Setup-Min A for bathing and dressing d/t IV, Supervision for transfers, Min A for toileting   Overall Mod I for BADL and homemaking as feasible to simulate home environment  Dynamic standing balance, adapted bathing/dressing, transfers, endurance   Mobility     Min Guard to MinA for upright mobility, limited by decreased endurance and R ankle pain  mod (I) overall w/ ambulation up to 30', supervision for car transfers  safety with mobility, activity tolerance, stair training    Communication               Safety/Cognition/ Behavioral Observations       To keep pt. free from fall during his stay in rehab.       Pain     No complain of pain.  To keep pain levels less than 3,on scale 1 to 10.  To monitor pain levels Q 2 hrs and PRN.    Skin     Skin dry and intact.  To keep skin free of presure ulcers.   To monitor skin condition Q shift.     Rehab Goals Patient  on target to meet rehab goals: Yes *See Care Plan and progress notes for long and short-term goals.    Barriers to Discharge:  pain ,behavior, baseline cognition      Possible Resolutions to Barriers:   improve cooperation, behavior     Discharge Planning/Teaching Needs:   home alone with intermittent assist of girlfriend and family members       Team Discussion:    Difficult pt/some conflicts with male PTs. Poor endurance,fatigue,gout in right ankle - may need bracing/ casting?  Impulsive and poor safety awareness.  Ambulates up to 40'. Often rejects any attempts from staff to offer teaching.  Anticipate reaching mod i goals.   Revisions  to Treatment Plan:    None    Continued Need for Acute Rehabilitation Level of Care: The patient requires daily medical management by a physician with specialized training in physical medicine and rehabilitation for the following conditions: Daily direction of a multidisciplinary physical rehabilitation program to ensure safe treatment while eliciting the highest outcome that is of practical value to the patient.: Yes Daily medical management of patient stability for increased activity during participation in an intensive rehabilitation regime.: Yes Daily analysis of laboratory values and/or radiology reports with any subsequent need for medication adjustment of medical intervention for : Neurological problems;Post surgical problems;Pulmonary problems;Cardiac problems  Annaclaire Walsworth 04/16/2014, 2:22 PM

## 2014-04-16 NOTE — Progress Notes (Signed)
ANTICOAGULATION CONSULT NOTE - Follow Up Consult  Pharmacy Consult for Coumadin Indication: atrial fibrillation  Allergies  Allergen Reactions  . Valsartan Cough    Patient Measurements: Height: 5\' 9"  (175.3 cm) Weight: 269 lb 13.5 oz (122.4 kg) IBW/kg (Calculated) : 70.7 Heparin Dosing Weight:   Vital Signs: Temp: 97.7 F (36.5 C) (03/29 0410) Temp Source: Oral (03/29 0410) BP: 106/77 mmHg (03/29 0410) Pulse Rate: 83 (03/29 0410)  Labs:  Recent Labs  04/14/14 0429 04/15/14 0635 04/16/14 0543  HGB 11.8*  --   --   HCT 36.8*  --   --   PLT 377  --   --   LABPROT 18.8* 21.1* 24.0*  INR 1.55* 1.81* 2.13*  CREATININE 1.80* 1.76* 1.65*    Estimated Creatinine Clearance: 68.5 mL/min (by C-G formula based on Cr of 1.65).   Medications:  Scheduled:  . acyclovir  400 mg Oral q morning - 10a  . amiodarone  200 mg Oral BID  . cyclobenzaprine  10 mg Oral BID  . doxycycline  100 mg Oral Q12H  . levothyroxine  250 mcg Oral QAC breakfast  . magnesium oxide  400 mg Oral Daily  . potassium chloride  20 mEq Oral BID  . ranolazine  1,000 mg Oral BID  . senna-docusate  2 tablet Oral QHS  . spironolactone  25 mg Oral BID  . torsemide  80 mg Oral BID  . Warfarin - Pharmacist Dosing Inpatient   Does not apply q1800    Assessment: 54yo male with AFib.  INR therapeutic this AM, no signs of bleeding.  Pt is also on Amiodarone 200mg  daily as well as Doxycycline, both of which may inc Coumadin effect.  Goal of Therapy:  INR 2-3 Monitor platelets by anticoagulation protocol: Yes   Plan:  Repeat Coumadin 5mg  Daily INR  Marisue Humble, PharmD Clinical Pharmacist [facility]

## 2014-04-17 ENCOUNTER — Inpatient Hospital Stay (HOSPITAL_COMMUNITY): Payer: Medicare Other | Admitting: Occupational Therapy

## 2014-04-17 ENCOUNTER — Inpatient Hospital Stay (HOSPITAL_COMMUNITY): Payer: Medicare Other | Admitting: Physical Therapy

## 2014-04-17 DIAGNOSIS — M109 Gout, unspecified: Secondary | ICD-10-CM | POA: Diagnosis present

## 2014-04-17 LAB — BASIC METABOLIC PANEL
Anion gap: 10 (ref 5–15)
BUN: 32 mg/dL — ABNORMAL HIGH (ref 6–23)
CO2: 30 mmol/L (ref 19–32)
Calcium: 9.2 mg/dL (ref 8.4–10.5)
Chloride: 93 mmol/L — ABNORMAL LOW (ref 96–112)
Creatinine, Ser: 1.75 mg/dL — ABNORMAL HIGH (ref 0.50–1.35)
GFR, EST AFRICAN AMERICAN: 50 mL/min — AB (ref >90)
GFR, EST NON AFRICAN AMERICAN: 43 mL/min — AB (ref >90)
Glucose, Bld: 123 mg/dL — ABNORMAL HIGH (ref 70–99)
Potassium: 3.7 mmol/L (ref 3.5–5.1)
SODIUM: 133 mmol/L — AB (ref 135–145)

## 2014-04-17 LAB — CBC
HEMATOCRIT: 36.2 % — AB (ref 39.0–52.0)
Hemoglobin: 13.2 g/dL (ref 13.0–17.0)
MCH: 33.1 pg (ref 26.0–34.0)
MCHC: 36.5 g/dL — AB (ref 30.0–36.0)
MCV: 90.7 fL (ref 78.0–100.0)
PLATELETS: 385 10*3/uL (ref 150–400)
RBC: 3.99 MIL/uL — ABNORMAL LOW (ref 4.22–5.81)
RDW: 15.8 % — AB (ref 11.5–15.5)
WBC: 11.3 10*3/uL — ABNORMAL HIGH (ref 4.0–10.5)

## 2014-04-17 LAB — PROTIME-INR
INR: 2.19 — AB (ref 0.00–1.49)
Prothrombin Time: 24.5 seconds — ABNORMAL HIGH (ref 11.6–15.2)

## 2014-04-17 MED ORDER — TORSEMIDE 100 MG PO TABS
100.0000 mg | ORAL_TABLET | Freq: Two times a day (BID) | ORAL | Status: DC
  Administered 2014-04-17 – 2014-04-18 (×4): 100 mg via ORAL
  Filled 2014-04-17 (×7): qty 1

## 2014-04-17 MED ORDER — WARFARIN SODIUM 5 MG PO TABS
5.0000 mg | ORAL_TABLET | Freq: Every day | ORAL | Status: DC
  Administered 2014-04-17 – 2014-04-18 (×2): 5 mg via ORAL
  Filled 2014-04-17 (×4): qty 1

## 2014-04-17 NOTE — Progress Notes (Signed)
ANTICOAGULATION CONSULT NOTE - Follow Up Consult  Pharmacy Consult for Coumadin Indication: atrial fibrillation and hx stroke  Allergies  Allergen Reactions  . Valsartan Cough    Patient Measurements: Height: 5\' 9"  (175.3 cm) Weight: 259 lb 0.7 oz (117.5 kg) IBW/kg (Calculated) : 70.7 Heparin Dosing Weight:   Vital Signs: Temp: 98.1 F (36.7 C) (03/30 1310) Temp Source: Oral (03/30 1310) BP: 108/74 mmHg (03/30 1310) Pulse Rate: 70 (03/30 1310)  Labs:  Recent Labs  04/15/14 0635 04/16/14 0543 04/17/14 0653  HGB  --   --  13.2  HCT  --   --  36.2*  PLT  --   --  385  LABPROT 21.1* 24.0* 24.5*  INR 1.81* 2.13* 2.19*  CREATININE 1.76* 1.65* 1.75*    Estimated Creatinine Clearance: 63.1 mL/min (by C-G formula based on Cr of 1.75).   Medications:  Scheduled:  . acyclovir  400 mg Oral q morning - 10a  . amiodarone  200 mg Oral BID  . cyclobenzaprine  10 mg Oral BID  . doxycycline  100 mg Oral Q12H  . furosemide  80 mg Intravenous Q12H  . levothyroxine  250 mcg Oral QAC breakfast  . magnesium oxide  400 mg Oral Daily  . potassium chloride  20 mEq Oral BID  . predniSONE  10 mg Oral BID WC  . ranolazine  1,000 mg Oral BID  . senna-docusate  2 tablet Oral QHS  . spironolactone  25 mg Oral BID  . torsemide  100 mg Oral BID  . Warfarin - Pharmacist Dosing Inpatient   Does not apply q1800    Assessment: 54yo male with AFib and history of stroke.  INR 2.19 this AM, Hg and pltc wnl this AM.  No bleeding noted.  Goal of Therapy:  INR 2-3 Monitor platelets by anticoagulation protocol: Yes   Plan:  Coumadin 5mg  daily Daily INR  Marisue Humble, PharmD Clinical Pharmacist [facility]

## 2014-04-17 NOTE — Progress Notes (Signed)
[facility] PHYSICAL MEDICINE & REHABILITATION     PROGRESS NOTE    Subjective/Complaints: Concerned that we are not checking his potassium levels.  Objective: Vital Signs: Blood pressure 124/70, pulse 70, temperature 97.9 F (36.6 C), temperature source Axillary, resp. rate 20, height 5\' 9"  (1.753 m), weight 117.5 kg (259 lb 0.7 oz), SpO2 96 %. No results found.  Recent Labs  04/17/14 0653  WBC 11.3*  HGB 13.2  HCT 36.2*  PLT 385    Recent Labs  04/16/14 0543 04/17/14 0653  NA 131* 133*  K 4.9 3.7  CL 101 93*  GLUCOSE 107* 123*  BUN 30* 32*  CREATININE 1.65* 1.75*  CALCIUM 8.9 9.2   CBG (last 3)  No results for input(s): GLUCAP in the last 72 hours.  Wt Readings from Last 3 Encounters:  04/17/14 117.5 kg (259 lb 0.7 oz)  04/09/14 120.611 kg (265 lb 14.4 oz)  03/27/14 122.925 kg (271 lb)    Physical Exam:  Constitutional: He is oriented to person, place, and time. He appears well-developed and well-nourished.  HENT: dentition fair. Mucosa moist Head: Normocephalic and atraumatic.  Eyes: Conjunctivae and EOM are normal. Pupils are equal, round, and reactive to light.  Neck: No tracheal deviation present. No thyromegaly present.  Cardiovascular: Normal rate and regular rhythm.    No murmur heard. Respiratory: Effort normal and breath sounds normal. No respiratory distress. An occasional upper airway sound during expiration.  GI: Soft. Bowel sounds are normal. He exhibits no distension. There is no tenderness.  Musculoskeletal: He exhibits no edema or tenderness.  Right foot with less erythema. minimal pain with ROM. mild pain with palpation over the mid foot. Neurological: He is alert and oriented to person, place, and time.  Slow measured speech. He was able to follow basic commands without difficulty. Left hemiparesis UE> LE with sensory deficits. LUE 2+ to 3-/5. LLE: 3/5 HF, 4-KE and 2+ aDF, 3+apf, RUE and RLE 4 to 4+/5, some limitations with  movement of right foot. Mild left facial droop. Speech clear.  Skin: Skin is warm and dry.  No breakdown Psychiatric: anxious.  His speech is delayed. He is slowed.    Assessment/Plan: 1. Functional deficits secondary to debility after chf/cellulitis which require 3+ hours per day of interdisciplinary therapy in a comprehensive inpatient rehab setting. Physiatrist is providing close team supervision and 24 hour management of active medical problems listed below. Physiatrist and rehab team continue to assess barriers to discharge/monitor patient progress toward functional and medical goals.  Had a discussion with patient about respecting the staff, particularly our male staff members. He acknowledged me and did not deny that his behavior was inappropriate at times. Will observe for improved interactions with staff  FIM: FIM - Bathing Bathing Steps Patient Completed: Chest, Right Arm, Left Arm, Abdomen, Front perineal area, Right upper leg, Left upper leg, Buttocks Bathing: 6: More than reasonable amount of time  FIM - Upper Body Dressing/Undressing Upper body dressing/undressing steps patient completed: Thread/unthread right sleeve of pullover shirt/dresss, Thread/unthread left sleeve of pullover shirt/dress, Put head through opening of pull over shirt/dress Upper body dressing/undressing: 0: Wears gown/pajamas-no public clothing FIM - Lower Body Dressing/Undressing Lower body dressing/undressing steps patient completed: Thread/unthread right pants leg, Thread/unthread left pants leg, Don/Doff right shoe, Don/Doff left shoe Lower body dressing/undressing: 0: Wears gown/pajamas-no public clothing  FIM - Toileting Toileting steps completed by patient: Adjust clothing prior to toileting Toileting Assistive Devices: Grab bar or rail for support Toileting: 1: Two  helpers  FIM - Diplomatic Services operational officer Devices: [employer], Therapist, music Transfers: 5-To toilet/BSC:  Supervision (verbal cues/safety issues), 5-From toilet/BSC: Supervision (verbal cues/safety issues)  FIM - Banker Devices: Teacher, music: 6: Supine > Sit: No assist, 6: Sit > Supine: No assist, 5: Bed > Chair or W/C: Supervision (verbal cues/safety issues), 5: Chair or W/C > Bed: Supervision (verbal cues/safety issues)  FIM - Locomotion: Wheelchair Distance: 20 Locomotion: Wheelchair: 1: Total Assistance/staff pushes wheelchair (Pt<25%) FIM - Locomotion: Ambulation Locomotion: Ambulation Assistive Devices: Emergency planning/management officer Ambulation/Gait Assistance: 4: Min guard, 5: Supervision Locomotion: Ambulation: 1: Travels less than 50 ft with minimal assistance (Pt.>75%)  Comprehension Comprehension Mode: Auditory Comprehension: 5-Understands complex 90% of the time/Cues < 10% of the time  Expression Expression Mode: Verbal Expression: 5-Expresses complex 90% of the time/cues < 10% of the time  Social Interaction Social Interaction: 4-Interacts appropriately 75 - 89% of the time - Needs redirection for appropriate language or to initiate interaction.  Problem Solving Problem Solving: 3-Solves basic 50 - 74% of the time/requires cueing 25 - 49% of the time  Memory Memory: 5-Recognizes or recalls 90% of the time/requires cueing < 10% of the time Medical Problem List and Plan: 1. Functional deficits secondary to debility after multiple medical issues/chf. Right foot cellulitis 2. DVT Prophylaxis/Anticoagulation: Pharmaceutical: Coumadin 3. Pain Management: will continue flexeril and oxycodone prn for pain.  4. Anxiety/Mood: continue xanax prn. Team to provide ego support and encouragement. LCSW to follow for evaluation and support.  5. Neuropsych: This patient is capable of making decisions on his own behalf. 6. Skin/Wound Care: Routine pressure relief measures. Maintain adequate nutritional status. Offer supplements  7.  Fluids/Electrolytes/Nutrition: Needs to limit water/ice intake to avoid overload and hyponatremia.   -potassium bid  8. 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 On home milrinone as 0.366mcg/kg/min, demadex 80 mg bid, spironolactone 25 mg bid. Not a good cadidate for advance therapies per cardiology.    -diuretic/volume mgt per cardiology (weight 117kg) 9. VT/NSVT:  . To continue ranolazine, amiodarone bid, kdur 40mq bid (hold) and mag ox daily. 10. A fib: Heart rate controlled. On warfarin.  11. H/o CVA with left hemiparesis 12. CKD: Monitor lytes daily. Renal status at baseline 13. Leukocytosis: resolved  -no clinical signs of infection  -remains on doxycycline 14. Right ankle pain---likely gouty arthritis: Uric Acid, 9.4  -ice  -resumed low dose prednisone for ongoing joint pain   . LOS (Days) 8 A FACE TO FACE EVALUATION WAS PERFORMED  Tarry Blayney T 04/17/2014 8:58 AM

## 2014-04-17 NOTE — Progress Notes (Signed)
Physical Therapy Session Note  Patient Details  Name: Nyheim Seufert MRN: [id number] Date of Birth: 05/23/1960  Today's Date: 04/17/2014 PT Individual Time: 1030-1130 Treatment Session 2: 1400-1500 PT Individual Time Calculation (min): 60 min Treatment Session 2: 60 min  Short Term Goals: Week 1:  PT Short Term Goal 1 (Week 1): STGs = LTGs due to ELOS  Skilled Therapeutic Interventions/Progress Updates:    Therapeutic Activity: Pt demonstrates mod I supine to/from sit in bed (flat without rails) mod I.  Pt demonstrates bed to w/c transfer with SPC mod I. PT instructs pt in car transfer at simulated [employer] SUV height, including simulated runner in place (but pt does not use this to get in/out of the car) req SPC and SBA for safety, as well as significant rest breaks in between transfers in/out of car due to limited cardiorespiratory activity tolerance.   Gait Training: PT instructs pt in ambulation with SPC req +2 assist for safety: close SBA from PT and w/c + IV pole follow from 2nd assist for safety: x 30' x 3 reps for endurance and functional mobility training. Pt desaturates to 91% on RA and BP drops to 102/83 with symptomatic dizziness after a rep of gait. Pt req multiple minutes to recover to baseline status.   Treatment Session 2: W/C Management: PT instructs pt in w/c propulsion using R UE and R LE req up to mod A for steering x 40' - pt's SaO2 on RA desaturates to 90% and pt req several minutes to return to baseline levels of SaO2.   Gait Training: PT instructs pt in ambulating up/down 2 stairs with SPC up and QC down req CGA-SBA x 3 sets - significant rest breaks taken in between sets until pt's SaO2 recovers to baseline levels and symptoms of dizziness are abolished.   Therapeutic Exercise: PT creates a personalized HEP and gives pt written handout and pt verbalizes understanding of home program, agreeing to try them out the next day.   Ambulation & stair goals  downgraded due to slow progress. W/C goals d/c due to lack of progress with manual w/c. PT has long discussion with pt about the use of a scooter within the home. This PT is recommending pt have supervision for extremely limited gait within the home at d/c due to pt's low activity tolerance, impaired balance, and high risk of falling, but no family members are available to provide this for the pt. Pt is able to complete bed mobility and transfers mod I, but truly needs close supervision for safety with limited gait distance. Pt will benefit from a scooter's use inside the home to reduce his risk of falls and promote much needed energy conservation. Pt reports his girlfriend's car has a Financial planner and she is willing to get a trailer to transport the scooter in. Pt also reports he would be able to use the ramp to enter his home if using a scooter because he has a door to lock/unlock in this entrance, whereas he has to complete 2 stairs through the garage because he has access to using the garage door opener remote and is able to leave the door into the house unlocked because he can simply lower the garage door. This PT continues to recommend the use of a scooter for energy conservation and safety within the home. Continue per PT POC.    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   See FIM for current functional status  Therapy/Group: Individual Therapy  Alejandra Barna M 04/17/2014, 10:39 AM

## 2014-04-17 NOTE — Progress Notes (Signed)
Patient ID: Raymond [name], male   DOB: [birth year], 54 y.o.   MRN: [id number]   54 yo with history of nonischemic cardiomyopathy, EF 10-15% by last echo and St Jude CRT-D device, chronic atrial fibrillation s/p AV nodal ablation, h/o VT, CKD, CVA presented with VT/VF and syncope.  At last office visit, torsemide was increased and amiodarone was decreased to 200 daily.  He was hypokalemic at admission.    Admitted to [facility] after he was shocked for VT on the evening of 3/12 and again on the evening of 3/13.  Seen by Dr Graciela Husbands and started on ranolazine.  Remains on milrinone at 0.375. Creatinine stable.  He was transferred to inpatient rehab on March 22nd on milrinone at 0.375 mcg. Discharge weight was 265 pounds.   Making slow progress on rehab. Rehab limited by right ankle pain.    Very good diuresis yesterday with IV Lasix.  Weight down (?10 lbs).  Creatinine stable.  Breathing better.   Scheduled Meds: . acyclovir  400 mg Oral q morning - 10a  . amiodarone  200 mg Oral BID  . cyclobenzaprine  10 mg Oral BID  . doxycycline  100 mg Oral Q12H  . furosemide  80 mg Intravenous Q12H  . levothyroxine  250 mcg Oral QAC breakfast  . magnesium oxide  400 mg Oral Daily  . potassium chloride  20 mEq Oral BID  . predniSONE  10 mg Oral BID WC  . ranolazine  1,000 mg Oral BID  . senna-docusate  2 tablet Oral QHS  . spironolactone  25 mg Oral BID  . torsemide  100 mg Oral BID  . Warfarin - Pharmacist Dosing Inpatient   Does not apply q1800   Continuous Infusions: . milrinone 0.375 mcg/kg/min (04/17/14 0426)   PRN Meds:.acetaminophen, acetaminophen, ALPRAZolam, alum & mag hydroxide-simeth, bisacodyl, diphenhydrAMINE, guaiFENesin-codeine, nitroGLYCERIN, ondansetron **OR** ondansetron (ZOFRAN) IV, oxyCODONE-acetaminophen, sodium chloride, sorbitol   Filed Vitals:   04/16/14 1017 04/16/14 1107 04/16/14 1138 04/17/14 0500  BP: 112/77  111/69 124/70  Pulse: 69  70 70  Temp:    97.9 F (36.6 C)    TempSrc:    Axillary  Resp: 22   20  Height:      Weight:  274 lb 8 oz (124.512 kg)  259 lb 0.7 oz (117.5 kg)  SpO2: 96%   96%    Intake/Output Summary (Last 24 hours) at 04/17/14 0852 Last data filed at 04/17/14 0657  Gross per 24 hour  Intake    600 ml  Output   5925 ml  Net  -5325 ml    LABS: Basic Metabolic Panel:  Recent Labs  91/47/82 0543 04/17/14 0653  NA 131* 133*  K 4.9 3.7  CL 101 93*  CO2 23 30  GLUCOSE 107* 123*  BUN 30* 32*  CREATININE 1.65* 1.75*  CALCIUM 8.9 9.2   Liver Function Tests: No results for input(s): AST, ALT, ALKPHOS, BILITOT, PROT, ALBUMIN in the last 72 hours. No results for input(s): LIPASE, AMYLASE in the last 72 hours. CBC:  Recent Labs  04/17/14 0653  WBC 11.3*  HGB 13.2  HCT 36.2*  MCV 90.7  PLT 385   Cardiac Enzymes: No results for input(s): CKTOTAL, CKMB, CKMBINDEX, TROPONINI in the last 72 hours. BNP: Invalid input(s): POCBNP D-Dimer: No results for input(s): DDIMER in the last 72 hours. Hemoglobin A1C: No results for input(s): HGBA1C in the last 72 hours. Fasting Lipid Panel: No results for input(s): CHOL, HDL, LDLCALC, TRIG,  CHOLHDL, LDLDIRECT in the last 72 hours. Thyroid Function Tests: No results for input(s): TSH, T4TOTAL, T3FREE, THYROIDAB in the last 72 hours.  Invalid input(s): FREET3 Anemia Panel: No results for input(s): VITAMINB12, FOLATE, FERRITIN, TIBC, IRON, RETICCTPCT in the last 72 hours.  RADIOLOGY: Dg Chest 2 View  03/28/2014   CLINICAL DATA:  Shortness of breath since last night.  EXAM: CHEST  2 VIEW  COMPARISON:  02/18/2014  FINDINGS: Pacer/AICD device.  A right internal jugular line terminates over the internal jugular vein. Midline trachea. Moderate cardiomegaly. No pleural effusion or pneumothorax. Mild interstitial edema. No lobar consolidation.  IMPRESSION: Mild congestive heart failure.  Right internal jugular line with tip projecting over the low right neck. Presuming low SVC position is  desired, this should be advanced approximately 11 cm.   Electronically Signed   By: Jeronimo Greaves M.D.   On: 03/28/2014 20:47   Dg Chest Port 1 View  04/02/2014   CLINICAL DATA:  Central line placement  EXAM: PORTABLE CHEST - 1 VIEW  COMPARISON:  03/30/2014  FINDINGS: Marked cardiac enlargement. Pulmonary vascular congestion and mild interstitial edema is unchanged. No significant effusion.  Right arm PICC tip enters the SVC with the tip not well visualized due to pacemaker leads.  Right jugular catheter tip overlies the neck at the cervical thoracic junction possibly and external jugular branch. This is unchanged.  IMPRESSION: Congestive heart failure with mild edema  Right arm PICC tip enters the SVC with the tip not seen due to overlying pacemaker leads  Right external jugular catheter tip remains in the lower neck unchanged from the prior study.   Electronically Signed   By: Marlan Palau M.D.   On: 04/02/2014 11:50   Dg Chest Port 1 View  03/30/2014   CLINICAL DATA:  54 year old male with a history of syncope. Report of AICD discharge  EXAM: PORTABLE CHEST - 1 VIEW  COMPARISON:  03/28/2014, fluoroscopy 02/21/2014  FINDINGS: Cardiomediastinal silhouette is similar to prior with cardiomegaly.  Central vascular congestion with right hilar opacities.  No pneumothorax.  No large pleural effusion. Retrocardiac region not well evaluated given the EKG leads, defibrillator pads, and overlying soft tissues of the chest wall.  Unchanged position of AICD generator on the left chest wall with 3 leads in place.  Right IJ tunneled central catheter has been withdrawn to the cervical internal jugular vein since the placement 02/21/2014.  IMPRESSION: Evidence of pulmonary vascular congestion/developing edema.  Cardiomegaly.  Interval withdrawal of the right IJ central venous catheter into the cervical internal jugular vein.  Overlying defibrillator pads.  Signed,  Yvone Neu. Loreta Ave, DO  [facility]  [facility] [place] [facility]   Electronically Signed   By: Gilmer Mor D.O.   On: 03/30/2014 11:33   Dg Foot Complete Right  04/03/2014   CLINICAL DATA:  Syncope and fall, injuring right foot 3 days ago  EXAM: RIGHT FOOT COMPLETE - 3+ VIEW  COMPARISON:  None.  FINDINGS: There is no evidence of fracture or dislocation. There is no evidence of arthropathy or other focal bone abnormality. Soft tissues are unremarkable.  IMPRESSION: Negative.   Electronically Signed   By: Ellery Plunk M.D.   On: 04/03/2014 00:51    PHYSICAL EXAM  General: NAD Neck: JVP 8 cm, no thyromegaly or thyroid nodule.  Lungs: Clear to auscultation bilaterally with normal respiratory effort. CV: Nondisplaced PMI.  Heart irregular S1/S2 (frequent PVCs), no S3/S4, no murmur.  No edema.  No carotid  bruit.  Normal pedal pulses.  Abdomen: obese, Soft, nontender, no hepatosplenomegaly, no distention.  Neurologic: Alert and oriented x 3.  Psych: Normal affect. Extremities: No clubbing or cyanosis.  R foot erythematous (improving) and less tender to touch  ASSESSMENT AND PLAN: 54 yo with history of nonischemic cardiomyopathy, EF 10-15% by last echo and St Jude CRT-D device, chronic atrial fibrillation s/p AV nodal ablation, h/o VT, CKD, CVA and VT/VF and syncope. On chronic milrinone 0.375 mcg.  1. VT: 03/30/2014 Shock.   BMETok from today.  - Continue po amiodarone 200 mg twice a day  - Continue ranolazine 1000 bid.  - Continue spironolactone 25 mg bid..  - Continue KDur 20 meq bid.    2. Chronic systolic CHF: EF 50-38% on 1/16 echo, nonischemic cardiomyopathy.  St Jude CRT-D device.  Not good candidate for advanced therapies, has no caregiver at home. On home milrinone at 0.375.  Volume status better today, weight is down.  - Restart torsemide today but at 100 mg bid. Follow BMET daily.  - Continue home milrinone at 0.375. 3. Atrial fibrillation: Chronic, s/p AVN ablation.  Continue warfarin per pharmacy. Goal  2.0-3.0 for INR 4. CKD: Creatinine stable at his baseline.  5. Foot pain: No fracture on imaging.  On prednisone, ?gout.    6. Deconditioning. Rehab services much appreciated.   Fransico Meadow 8:52 AM  04/17/2014

## 2014-04-17 NOTE — Progress Notes (Signed)
3/30   0600  Pt verb concern with getting more IV lasix and his K* level. Reminded will have lab drawn this am. Wants to wait for results before getting the Lasix. IV nurse here and drew labs at 0645. No results in computer when checked at 0740. Passed on to next shift. KM

## 2014-04-17 NOTE — Plan of Care (Signed)
Problem: RH Ambulation Goal: LTG Patient will ambulate in controlled environment (PT) LTG: Patient will ambulate in a controlled environment, # of feet with assistance (PT).  Goal downgraded due to slow progress Goal: LTG Patient will ambulate in home environment (PT) LTG: Patient will ambulate in home environment, # of feet with assistance (PT).  Goal downgraded due to slow progress Goal: LTG Patient will ambulate in community environment (PT) LTG: Patient will ambulate in community environment, # of feet with assistance (PT).  Goal downgraded due to slow progress  Problem: RH Wheelchair Mobility Goal: LTG Patient will propel w/c in controlled environment (PT) LTG: Patient will propel wheelchair in controlled environment, # of feet with assist (PT)  Outcome: Not Applicable Date Met:  50/75/73 Goal d/c due to lack of progress Goal: LTG Patient will propel w/c in home environment (PT) LTG: Patient will propel wheelchair in home environment, # of feet with assistance (PT).  Outcome: Not Applicable Date Met:  22/56/72 Goal d/c due to lack of progress  Problem: RH Stairs Goal: LTG Patient will ambulate up and down stairs w/assist (PT) LTG: Patient will ambulate up and down # of stairs with assistance (PT)  Goal downgraded due to slow progress.

## 2014-04-17 NOTE — Progress Notes (Signed)
Occupational Therapy Session Note  Patient Details  Name: Raymond [name] MRN: [id number] Date of Birth: [birth year]  Today's Date: 04/17/2014 OT Individual Time: 0900-1000 OT Individual Time Calculation (min): 60 min    Short Term Goals: Week 1:  OT Short Term Goal 1 (Week 1): STG=LTG due to short length of stay.   Skilled Therapeutic Interventions/Progress Updates:    Pt agreed to participate in bathing and dressing sit to stand at the sink this session.  He was able to transfer from supine with HOB elevated with modified independence. Therapist wrapped his right ankle with ace wrap as he requested secondary to pain with weightbearing.  No pain reported with transfer or standing during session however.   Transfer stand pivot to the wheelchair with min guard assist using the single point cane.  He attempts to stand with the RUE positioned on the cane during all transfers instead of pushing off of the surface.  This is how he performed sit to stand and transfers at home and is fixated on ways that have been successful.  Min assist for UB bathing as therapist assisted with washing under right arm, however he was agreeable to attempting to dry, using the LUE.  Pt with no clothing this session so donned gown.  Pt performed grooming tasks in sitting in the wheelchair, including brushing his teeth and shaving.  Checked orthostatics as well during session with all within the 110/70 range.  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  Positioned back in bed at end of session.   Therapy Documentation Precautions:  Precautions Precautions: Fall, ICD/Pacemaker, Other (comment) Precaution Comments: pacemaker placed 5 years ago; L side hemiplegia: L arm weaker than L leg Restrictions Weight Bearing Restrictions: No  Pain: Pain Assessment Pain Assessment: 0-10 Pain Score: 2  Pain Type: Acute pain Pain Location: Ankle Pain Orientation: Right Pain Descriptors /  Indicators: Aching Pain Onset: On-going Pain Intervention(s): Other (Comment) (ace-wrap removed to give pt's skin a break, then re-donned per pt request) Multiple Pain Sites: No ADL: See FIM for current functional status  Therapy/Group: Individual Therapy  Simar Pothier OTR/L 04/17/2014, 12:11 PM

## 2014-04-18 ENCOUNTER — Inpatient Hospital Stay (HOSPITAL_COMMUNITY): Payer: Medicare Other

## 2014-04-18 ENCOUNTER — Inpatient Hospital Stay (HOSPITAL_COMMUNITY): Payer: Medicare Other | Admitting: Physical Therapy

## 2014-04-18 ENCOUNTER — Encounter: Payer: Self-pay | Admitting: Internal Medicine

## 2014-04-18 LAB — PROTIME-INR
INR: 2.39 — ABNORMAL HIGH (ref 0.00–1.49)
Prothrombin Time: 26.2 seconds — ABNORMAL HIGH (ref 11.6–15.2)

## 2014-04-18 LAB — BASIC METABOLIC PANEL
Anion gap: 12 (ref 5–15)
BUN: 33 mg/dL — ABNORMAL HIGH (ref 6–23)
CHLORIDE: 91 mmol/L — AB (ref 96–112)
CO2: 30 mmol/L (ref 19–32)
CREATININE: 1.93 mg/dL — AB (ref 0.50–1.35)
Calcium: 8.8 mg/dL (ref 8.4–10.5)
GFR calc non Af Amer: 39 mL/min — ABNORMAL LOW (ref >90)
GFR, EST AFRICAN AMERICAN: 45 mL/min — AB (ref >90)
Glucose, Bld: 136 mg/dL — ABNORMAL HIGH (ref 70–99)
POTASSIUM: 3.3 mmol/L — AB (ref 3.5–5.1)
Sodium: 133 mmol/L — ABNORMAL LOW (ref 135–145)

## 2014-04-18 MED ORDER — PREDNISONE 10 MG PO TABS
10.0000 mg | ORAL_TABLET | Freq: Every day | ORAL | Status: DC
  Administered 2014-04-18 – 2014-04-20 (×3): 10 mg via ORAL
  Filled 2014-04-18 (×4): qty 1

## 2014-04-18 MED ORDER — POTASSIUM CHLORIDE CRYS ER 20 MEQ PO TBCR
40.0000 meq | EXTENDED_RELEASE_TABLET | Freq: Once | ORAL | Status: AC
  Administered 2014-04-18: 40 meq via ORAL

## 2014-04-18 NOTE — Progress Notes (Signed)
Physical Therapy Session Note  Patient Details  Name: Raymond [name] MRN: [id number] Date of Birth: 03/27/1960  Today's Date: 04/18/2014 PT Individual Time: 1500-1530 PT Individual Time Calculation (min): 30 min   Short Term Goals: Week 1:  PT Short Term Goal 1 (Week 1): STGs = LTGs due to ELOS  Skilled Therapeutic Interventions/Progress Updates:   Pt up in w/c and requesting to go back to bed due to fatigue. Agreeable to review HEP for functional strengthening and endurance including writing the alphabet with his ankle - each side, LAQ x 10 each side, seated march x 10 each side, seated knee flexion x 10 reps each side, isometric gluteal contractions x 10 reps, scapular retractions bilaterally x 10 reps, unilateral hip ER/IR x 10 each side, heel slides x 10 each side, SLR x 10 reps each side, supine hip abduction/adduction x 10 each side, lumbar rotation with B knees side to side x 10 reps each side. Performed basic transfers using SPC, dynamic balance to adjust bed linens, and bed mobility at mod I level. All needs in reach at end of session.  Pt reports family to come in at 13:30 tomorrow for family education. Notified scheduling team and primary PT.   Therapy Documentation Precautions:  Precautions Precautions: Fall, ICD/Pacemaker, Other (comment) Precaution Comments: pacemaker placed 5 years ago; L side hemiplegia: L arm weaker than L leg Restrictions Weight Bearing Restrictions: No  Pain:  No complaints.   See FIM for current functional status  Therapy/Group: Individual Therapy  Karolee Stamps [facility] 04/18/2014, 3:48 PM

## 2014-04-18 NOTE — Progress Notes (Signed)
Physical Therapy Session Note  Patient Details  Name: Raymond [name] MRN: [id number] Date of Birth: 1960/08/11  Today's Date: 04/18/2014 PT Individual Time: 0830-0930  PT Individual Time Calculation (min): 60 min   Short Term Goals: Week 1:  PT Short Term Goal 1 (Week 1): STGs = LTGs due to ELOS  Skilled Therapeutic Interventions/Progress Updates:    Treatment Session 1: Therapeutic Exercise: PT hands pt written handout of HEP and pt reports he cannot read. PT guides pt to look at the pictures of each exercise and use these as instructions for what to do. Exercises include: writing the alphabet with his ankle - each side, LAQ x 10 each side, seated march x 10 each side, seated knee flexion x 10 reps each side, isometric gluteal contractions x 10 reps, scapular retractions done unilaterally x 10 each side then bilaterally x 10 each side, unilateral hip ER/IR x 10 each side, heel slides x 10 each side, SLR x 10 reps each side, supine hip abduction/adduction x 10 each side, lumbar rotation with B knees side to side x 10 reps each side.   Therapeutic Activity: PT gives pt fresh non-skid socks and pt dons them with set-up assist, after stating he can't put them on himself. PT explains to pt that it is a good idea for him to wear non-skid socks at home in order to prevent his feet from getting infections in them (if small cuts exist). Pt transfers w/c to/from bed with SPC mod I. Pt demonstrates sit to supine transfer in flat bed without rails mod I - pt then raises HOB for comfort and ease of breathing.   Gait Training: PT sets up chair 30' from w/c and pt demonstrates ability to ambulate with SPC x 30' req set-up assist for IV pole management - no LOB req assist to correct. PT sets up 2 stairs (curb step with 4" step) and pt ascends with SPC and descends with QC req close SBA for safety.   PT instructs pt in energy conservation/pacing techniques - pt is to listen to his body and if he is out of  breath, then he is to relax until his breathing has normalized, and then continue with the next exercise. Pt reports he cannot read HEP - PT cues pt to look at pictures and pt is able to discern how to do each exercise with the picture cues. Pt is slowly progressing with stairs, but continues to req supervision for safety due to abandoning quad cane at bottom of stairs, but before reaching w/c. Pt reports family will be present for training at 1:30pm on Friday (stairs and car transfer). Continue per PT POC.    Therapy Documentation Precautions:  Precautions Precautions: Fall, ICD/Pacemaker, Other (comment) Precaution Comments: pacemaker placed 5 years ago; L side hemiplegia: L arm weaker than L leg Restrictions Weight Bearing Restrictions: No Pain: Pain Assessment Pain Assessment: No/denies pain   See FIM for current functional status  Therapy/Group: Individual Therapy  Raymond [name] 04/18/2014, 8:37 AM

## 2014-04-18 NOTE — Progress Notes (Signed)
Social Work Patient ID: Raynelle Chary, male   DOB: [birth year], 54 y.o.   MRN: [id number]   Met with pt to review team conference information and review concerns expressed by therapies regarding pt's safety if home alone.  Pt aware that d/c date targeted for 4/2 and agreeable.  He reports he is also aware of the concerns with his changes/ drops in BP when ambulating further distances which could put him at a fall risk.  He reports that he has intermittent support of family, girlfriend and neighbors, however, no 24/7 care available.  PT recommending that pt have a power scooter in the home to curb the risk for fall, however, pt will have to pursue getting a scooter via a retail store after d/c.  Pt aware that until he is able to get the scooter, that team recommends he have 24/7 support vs considering a SNF. Pt refuses SNF and states he fully intends to d/c directly home and will work to obtain a scooter as quickly as he is able.  States he understands his risks and plans to minimize his mobility as much as he can in the home.  Will also review this info with pt's mother per his request.  Providing him with prescription for scooter and directions for local retail store option.  Continue to follow.  Huckleberry Martinson, LCSW

## 2014-04-18 NOTE — Progress Notes (Signed)
Nursing Note: Pt refused orthostatic vitals.Checked regular vitals only as pt would allow this.See Doc. [email or web address]

## 2014-04-18 NOTE — Progress Notes (Signed)
ANTICOAGULATION CONSULT NOTE - Follow Up Consult  Pharmacy Consult for coumadin Indication: atrial fibrillation  Allergies  Allergen Reactions  . Valsartan Cough    Patient Measurements: Height: 5\' 9"  (175.3 cm) Weight: 259 lb 0.7 oz (117.5 kg) IBW/kg (Calculated) : 70.7 Heparin Dosing Weight:   Vital Signs: Temp: 97.7 F (36.5 C) (03/31 0503) Temp Source: Oral (03/31 0503)  Labs:  Recent Labs  04/16/14 0543 04/17/14 0653 04/18/14 0555  HGB  --  13.2  --   HCT  --  36.2*  --   PLT  --  385  --   LABPROT 24.0* 24.5* 26.2*  INR 2.13* 2.19* 2.39*  CREATININE 1.65* 1.75* 1.93*    Estimated Creatinine Clearance: 57.3 mL/min (by C-G formula based on Cr of 1.93).   Medications:  Scheduled:  . acyclovir  400 mg Oral q morning - 10a  . amiodarone  200 mg Oral BID  . cyclobenzaprine  10 mg Oral BID  . doxycycline  100 mg Oral Q12H  . levothyroxine  250 mcg Oral QAC breakfast  . magnesium oxide  400 mg Oral Daily  . potassium chloride  20 mEq Oral BID  . potassium chloride  40 mEq Oral Once  . predniSONE  10 mg Oral BID WC  . ranolazine  1,000 mg Oral BID  . senna-docusate  2 tablet Oral QHS  . spironolactone  25 mg Oral BID  . torsemide  100 mg Oral BID  . warfarin  5 mg Oral q1800  . Warfarin - Pharmacist Dosing Inpatient   Does not apply q1800   Infusions:  . milrinone 0.375 mcg/kg/min (04/18/14 0303)    Assessment: 54 yo male with hx of afib is currently on therapeutic coumadin.  INR today is 2.39 Goal of Therapy:  INR 2-3 Monitor platelets by anticoagulation protocol: Yes   Plan:  Coumadin 5mg  daily - Daily PT / INR  Davin Muramoto, Tsz-Yin 04/18/2014,8:47 AM

## 2014-04-18 NOTE — Progress Notes (Signed)
[place] PHYSICAL MEDICINE & REHABILITATION     PROGRESS NOTE    Subjective/Complaints: Coughing better. Apparently had a good day with therapies.  Objective: Vital Signs: Blood pressure 108/74, pulse 70, temperature 97.7 F (36.5 C), temperature source Oral, resp. rate 18, height 5\' 9"  (1.753 m), weight 117.5 kg (259 lb 0.7 oz), SpO2 97 %. No results found.  Recent Labs  04/17/14 0653  WBC 11.3*  HGB 13.2  HCT 36.2*  PLT 385    Recent Labs  04/17/14 0653 04/18/14 0555  NA 133* 133*  K 3.7 3.3*  CL 93* 91*  GLUCOSE 123* 136*  BUN 32* 33*  CREATININE 1.75* 1.93*  CALCIUM 9.2 8.8   CBG (last 3)  No results for input(s): GLUCAP in the last 72 hours.  Wt Readings from Last 3 Encounters:  04/17/14 117.5 kg (259 lb 0.7 oz)  04/09/14 120.611 kg (265 lb 14.4 oz)  03/27/14 122.925 kg (271 lb)    Physical Exam:  Constitutional: He is oriented to person, place, and time. He appears well-developed and well-nourished.  HENT: dentition fair. Mucosa moist Head: Normocephalic and atraumatic.  Eyes: Conjunctivae and EOM are normal. Pupils are equal, round, and reactive to light.  Neck: No tracheal deviation present. No thyromegaly present.  Cardiovascular: Normal rate and regular rhythm.    No murmur heard. Respiratory: Effort normal and breath sounds normal. No respiratory distress. An occasional upper airway sound during expiration.  GI: Soft. Bowel sounds are normal. He exhibits no distension. There is no tenderness.  Musculoskeletal: He exhibits no edema or tenderness.  Right foot with less erythema. minimal pain with ROM. mild pain with palpation over the mid foot. Neurological: He is alert and oriented to person, place, and time.  Slow measured speech. He was able to follow basic commands without difficulty. Left hemiparesis UE> LE with sensory deficits. LUE 2+ to 3-/5. LLE: 3/5 HF, 4-KE and 2+ aDF, 3+apf, RUE and RLE 4 to 4+/5, some limitations with movement  of right foot. Mild left facial droop. Speech clear.  Skin: Skin is warm and dry.  No breakdown Psychiatric: anxious.  His speech is delayed. He is slowed.    Assessment/Plan: 1. Functional deficits secondary to debility after chf/cellulitis which require 3+ hours per day of interdisciplinary therapy in a comprehensive inpatient rehab setting. Physiatrist is providing close team supervision and 24 hour management of active medical problems listed below. Physiatrist and rehab team continue to assess barriers to discharge/monitor patient progress toward functional and medical goals.   FIM: FIM - Bathing Bathing Steps Patient Completed: Chest, Right Arm, Left Arm, Abdomen, Front perineal area, Right upper leg, Left upper leg, Buttocks, Right lower leg (including foot), Left lower leg (including foot) Bathing: 4: Steadying assist  FIM - Upper Body Dressing/Undressing Upper body dressing/undressing steps patient completed: Thread/unthread right sleeve of pullover shirt/dresss, Thread/unthread left sleeve of pullover shirt/dress, Put head through opening of pull over shirt/dress Upper body dressing/undressing: 0: Wears gown/pajamas-no public clothing FIM - Lower Body Dressing/Undressing Lower body dressing/undressing steps patient completed: Don/Doff right shoe, Don/Doff left shoe Lower body dressing/undressing: 5: Supervision: Safety issues/verbal cues  FIM - Toileting Toileting steps completed by patient: Adjust clothing prior to toileting, Performs perineal hygiene, Adjust clothing after toileting Toileting Assistive Devices: Grab bar or rail for support Toileting: 5: Set-up assist to: Obtain supplies  FIM - Diplomatic Services operational officer Devices: Axis, Therapist, music Transfers: 5-To toilet/BSC: Supervision (verbal cues/safety issues), 5-From toilet/BSC: Supervision (verbal cues/safety issues)  FIM - Banker Devices:  Teacher, music: 6: Supine > Sit: No assist, 6: Sit > Supine: No assist, 6: Bed > Chair or W/C: No assist, 6: Chair or W/C > Bed: No assist  FIM - Locomotion: Wheelchair Distance: 40 Locomotion: Wheelchair: 1: Travels less than 50 ft with moderate assistance (Pt: 50 - 74%) FIM - Locomotion: Ambulation Locomotion: Ambulation Assistive Devices: Emergency planning/management officer Ambulation/Gait Assistance: 1: +2 Total assist Locomotion: Ambulation: 1: Two helpers  Comprehension Comprehension Mode: Auditory Comprehension: 5-Understands complex 90% of the time/Cues < 10% of the time  Expression Expression Mode: Verbal Expression: 5-Expresses basic needs/ideas: With extra time/assistive device  Social Interaction Social Interaction: 4-Interacts appropriately 75 - 89% of the time - Needs redirection for appropriate language or to initiate interaction.  Problem Solving Problem Solving: 4-Solves basic 75 - 89% of the time/requires cueing 10 - 24% of the time  Memory Memory: 5-Recognizes or recalls 90% of the time/requires cueing < 10% of the time Medical Problem List and Plan: 1. Functional deficits secondary to debility after multiple medical issues/chf. Right foot cellulitis 2. DVT Prophylaxis/Anticoagulation: Pharmaceutical: Coumadin 3. Pain Management: will continue flexeril and oxycodone prn for pain.  4. Anxiety/Mood: continue xanax prn. Team to provide ego support and encouragement. LCSW to follow for evaluation and support.  5. Neuropsych: This patient is capable of making decisions on his own behalf. 6. Skin/Wound Care: Routine pressure relief measures. Maintain adequate nutritional status. Offer supplements  7. Fluids/Electrolytes/Nutrition: Needs to limit water/ice intake to avoid overload and hyponatremia.   -potassium bid, increase to  8. 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 On home milrinone as 0.356mcg/kg/min, demadex 80 mg bid, spironolactone 25 mg bid. Not a good cadidate for advance therapies per cardiology.    -diuretic/volume mgt per cardiology (weight trending down 117kg)  -pt is concerned about location of catheter and manipulation of shirt/clothing---will work with OT on problem solving 9. VT/NSVT:  . To continue ranolazine, amiodarone bid, kdur 40mq bid (hold) and mag ox daily. 10. A fib: Heart rate controlled. On warfarin.  11. H/o CVA with left hemiparesis 12. CKD: Monitor lytes daily. Renal status at baseline 13. Leukocytosis: resolved  -no clinical signs of infection  -remains on doxycycline 14. Right ankle pain---likely gouty arthritis: Uric Acid, 9.4  -ice  -resumed low dose prednisone for ongoing joint pain--decrease prednisone to  daily   . LOS (Days) 9 A FACE TO FACE EVALUATION WAS PERFORMED  Blasa Raisch T 04/18/2014 8:45 AM

## 2014-04-18 NOTE — Progress Notes (Signed)
Occupational Therapy Session Note  Patient Details  Name: Raymond [name] MRN: [id number] Date of Birth: [birth year]  Today's Date: 04/18/2014 OT Individual Time: 0730-0800 OT Individual Time Calculation (min): 30 min    Short Term Goals: Week 1:  OT Short Term Goal 1 (Week 1): STG=LTG due to short length of stay.   Skilled Therapeutic Interventions/Progress Updates: ADL-retraining with focus on AE training to improve performance with lower body bathing.    Pt received supine in bed, receptive for bathing/dressing, abbreviated session as directed.    With setup assist to provide supplies, pt completed bed mobility unassisted, rose from edge of bed and  ambulated to w/c placed at sink with only standby assist.    Pt sat at sink and performed upper body bathing with assist only to manage IV pole and provide supplies d/t time limitations.    Pt was requires assist to wash skin fold under abdomin while seated but states he is able to reach fold when supine or reclined.  Pt deferred washing his lower legs and feet d/t limited reach but was receptive for instruction on use of LH sponge this session and washed his feet after one demonstration on technique.    Pt required assist to don gown d/t decreased FMC at hand with inability to fasten snaps.    However, during dressing, pt's MD, Dr. Shirlee Latch, arrived and pt thoughtfully requested clarification on procedures relating to disconnecting IVs to allow upper body dressing at home, s/p d/c.   Per MD, pt will be trained on technique with [facility] agency providing f/u care.   Pt was satisfied with response and remained in w/c at end of session, with all needs within reach.     Therapy Documentation Precautions:  Precautions Precautions: Fall, ICD/Pacemaker, Other (comment) Precaution Comments: pacemaker placed 5 years ago; L side hemiplegia: L arm weaker than L leg Restrictions Weight Bearing Restrictions: No  Pain: Pain Assessment Pain Assessment: No/denies  pain  See FIM for current functional status  Therapy/Group: Individual Therapy   Second session: Time: 1100-1200 Time Calculation (min):  60 min  Pain Assessment: No/denies pain  Skilled Therapeutic Interventions:  Therapeutic activities (30 min) with focus on home safety relating to accidental fall (floor recovery).   Pt received seated in his w/c awaiting therapist for planned session.   After re-ed on home safety needs and review of DME (pt reports no needs), pt reported only one recent fall, at this facility, but was unable to provide date or time, stating he fell while in bathroom and used his cane to recover.   Pt reports no injury from incident and declared that he doesn't fall at home.   OT reinforced need for potential falls and escorted pt to ADL apt for training.   OT demonstrated fall recovery method using furniture as transitional surface for recovery.   With min assist to assume supine position on floor, pt recovered to edge of bed unassisted with OT managing IV pole as directed by pt during movements.   Following floor recovery, pt was escorted outdoors to completed HEP for BUE using thera-band (therex).   Pt was escorted back to his room after HEP and requested setup assist with grooming (shaving at sink).   Pt demonstrated improved social interaction this date with appropriate responses to therapist and staff during entire encounter.   See FIM for current functional status  Therapy/Group: Individual Therapy  Jamy Cleckler 04/18/2014, 10:37 AM

## 2014-04-18 NOTE — Progress Notes (Signed)
Patient ID: Raymond [name], male   DOB: 10-10-1960, 54 y.o.   MRN: [id number]  54 yo with history of nonischemic cardiomyopathy, EF 10-15% by last echo and St Jude CRT-D device, chronic atrial fibrillation s/p AV nodal ablation, h/o VT, CKD, CVA presented with VT/VF and syncope.  At last office visit, torsemide was increased and amiodarone was decreased to 200 daily.  He was hypokalemic at admission.    Admitted to [facility] after he was shocked for VT on the evening of 3/12 and again on the evening of 3/13.  Seen by Dr Graciela Husbands and started on ranolazine.  Remains on milrinone at 0.375. Creatinine stable.  He was transferred to inpatient rehab on March 22nd on milrinone at 0.375 mcg. Discharge weight was 265 pounds.   Walking with rehab, overall seems to be doing better.   Diuresed well again yesterday, now on po torsemide at a higher dose.  Creatinine a bit higher this morning. Denies dyspnea.   Scheduled Meds: . acyclovir  400 mg Oral q morning - 10a  . amiodarone  200 mg Oral BID  . cyclobenzaprine  10 mg Oral BID  . doxycycline  100 mg Oral Q12H  . levothyroxine  250 mcg Oral QAC breakfast  . magnesium oxide  400 mg Oral Daily  . potassium chloride  20 mEq Oral BID  . potassium chloride  40 mEq Oral Once  . predniSONE  10 mg Oral BID WC  . ranolazine  1,000 mg Oral BID  . senna-docusate  2 tablet Oral QHS  . spironolactone  25 mg Oral BID  . torsemide  100 mg Oral BID  . warfarin  5 mg Oral q1800  . Warfarin - Pharmacist Dosing Inpatient   Does not apply q1800   Continuous Infusions: . milrinone 0.375 mcg/kg/min (04/18/14 0303)   PRN Meds:.acetaminophen, acetaminophen, ALPRAZolam, alum & mag hydroxide-simeth, bisacodyl, diphenhydrAMINE, guaiFENesin-codeine, nitroGLYCERIN, ondansetron **OR** ondansetron (ZOFRAN) IV, oxyCODONE-acetaminophen, sodium chloride, sorbitol   Filed Vitals:   04/17/14 1041 04/17/14 1310 04/17/14 2000 04/18/14 0503  BP: 134/74 108/74    Pulse: 69 70    Temp:   98.1 F (36.7 C) 98.4 F (36.9 C) 97.7 F (36.5 C)  TempSrc:  Oral Oral Oral  Resp:  20  18  Height:      Weight:      SpO2: 100% 97%  97%    Intake/Output Summary (Last 24 hours) at 04/18/14 0759 Last data filed at 04/18/14 0513  Gross per 24 hour  Intake    600 ml  Output   4375 ml  Net  -3775 ml    LABS: Basic Metabolic Panel:  Recent Labs  40/98/11 0653 04/18/14 0555  NA 133* 133*  K 3.7 3.3*  CL 93* 91*  CO2 30 30  GLUCOSE 123* 136*  BUN 32* 33*  CREATININE 1.75* 1.93*  CALCIUM 9.2 8.8   Liver Function Tests: No results for input(s): AST, ALT, ALKPHOS, BILITOT, PROT, ALBUMIN in the last 72 hours. No results for input(s): LIPASE, AMYLASE in the last 72 hours. CBC:  Recent Labs  04/17/14 0653  WBC 11.3*  HGB 13.2  HCT 36.2*  MCV 90.7  PLT 385   Cardiac Enzymes: No results for input(s): CKTOTAL, CKMB, CKMBINDEX, TROPONINI in the last 72 hours. BNP: Invalid input(s): POCBNP D-Dimer: No results for input(s): DDIMER in the last 72 hours. Hemoglobin A1C: No results for input(s): HGBA1C in the last 72 hours. Fasting Lipid Panel: No results for input(s): CHOL,  HDL, LDLCALC, TRIG, CHOLHDL, LDLDIRECT in the last 72 hours. Thyroid Function Tests: No results for input(s): TSH, T4TOTAL, T3FREE, THYROIDAB in the last 72 hours.  Invalid input(s): FREET3 Anemia Panel: No results for input(s): VITAMINB12, FOLATE, FERRITIN, TIBC, IRON, RETICCTPCT in the last 72 hours.  RADIOLOGY: Dg Chest 2 View  03/28/2014   CLINICAL DATA:  Shortness of breath since last night.  EXAM: CHEST  2 VIEW  COMPARISON:  02/18/2014  FINDINGS: Pacer/AICD device.  A right internal jugular line terminates over the internal jugular vein. Midline trachea. Moderate cardiomegaly. No pleural effusion or pneumothorax. Mild interstitial edema. No lobar consolidation.  IMPRESSION: Mild congestive heart failure.  Right internal jugular line with tip projecting over the low right neck. Presuming low  SVC position is desired, this should be advanced approximately 11 cm.   Electronically Signed   By: Jeronimo Greaves M.D.   On: 03/28/2014 20:47   Dg Chest Port 1 View  04/02/2014   CLINICAL DATA:  Central line placement  EXAM: PORTABLE CHEST - 1 VIEW  COMPARISON:  03/30/2014  FINDINGS: Marked cardiac enlargement. Pulmonary vascular congestion and mild interstitial edema is unchanged. No significant effusion.  Right arm PICC tip enters the SVC with the tip not well visualized due to pacemaker leads.  Right jugular catheter tip overlies the neck at the cervical thoracic junction possibly and external jugular branch. This is unchanged.  IMPRESSION: Congestive heart failure with mild edema  Right arm PICC tip enters the SVC with the tip not seen due to overlying pacemaker leads  Right external jugular catheter tip remains in the lower neck unchanged from the prior study.   Electronically Signed   By: Marlan Palau M.D.   On: 04/02/2014 11:50   Dg Chest Port 1 View  03/30/2014   CLINICAL DATA:  54 year old male with a history of syncope. Report of AICD discharge  EXAM: PORTABLE CHEST - 1 VIEW  COMPARISON:  03/28/2014, fluoroscopy 02/21/2014  FINDINGS: Cardiomediastinal silhouette is similar to prior with cardiomegaly.  Central vascular congestion with right hilar opacities.  No pneumothorax.  No large pleural effusion. Retrocardiac region not well evaluated given the EKG leads, defibrillator pads, and overlying soft tissues of the chest wall.  Unchanged position of AICD generator on the left chest wall with 3 leads in place.  Right IJ tunneled central catheter has been withdrawn to the cervical internal jugular vein since the placement 02/21/2014.  IMPRESSION: Evidence of pulmonary vascular congestion/developing edema.  Cardiomegaly.  Interval withdrawal of the right IJ central venous catheter into the cervical internal jugular vein.  Overlying defibrillator pads.  Signed,  Yvone Neu. Loreta Ave, DO  [facility]  [facility]   Electronically Signed   By: Gilmer Mor D.O.   On: 03/30/2014 11:33   Dg Foot Complete Right  04/03/2014   CLINICAL DATA:  Syncope and fall, injuring right foot 3 days ago  EXAM: RIGHT FOOT COMPLETE - 3+ VIEW  COMPARISON:  None.  FINDINGS: There is no evidence of fracture or dislocation. There is no evidence of arthropathy or other focal bone abnormality. Soft tissues are unremarkable.  IMPRESSION: Negative.   Electronically Signed   By: Ellery Plunk M.D.   On: 04/03/2014 00:51    PHYSICAL EXAM  General: NAD Neck: JVP 8 cm, no thyromegaly or thyroid nodule.  Lungs: Clear to auscultation bilaterally with normal respiratory effort. CV: Nondisplaced PMI.  Heart irregular S1/S2 (frequent PVCs), no S3/S4, no murmur.  No edema.  No carotid bruit.  Normal pedal pulses.  Abdomen: obese, Soft, nontender, no hepatosplenomegaly, no distention.  Neurologic: Alert and oriented x 3.  Psych: Normal affect. Extremities: No clubbing or cyanosis.  R foot erythematous (improving) and less tender to touch  ASSESSMENT AND PLAN: 54 yo with history of nonischemic cardiomyopathy, EF 10-15% by last echo and St Jude CRT-D device, chronic atrial fibrillation s/p AV nodal ablation, h/o VT, CKD, CVA and VT/VF and syncope. On chronic milrinone 0.375 mcg.  1. VT: 03/30/2014 Shock.   - Continue po amiodarone 200 mg twice a day  - Continue ranolazine 1000 bid.  - Continue spironolactone 25 mg bid.  - Continue KDur 20 meq bid, give extra 40 today.    2. Chronic systolic CHF: EF 16-10% on 1/16 echo, nonischemic cardiomyopathy.  St Jude CRT-D device.  Not good candidate for advanced therapies, has no caregiver at home. On home milrinone at 0.375.  Volume status better today, weight is down again today.  - Continue torsemide at higher dose, 100 mg bid.  If creatinine continues to rise, however, will need to back down in dosage again.  - Continue home milrinone  at 0.375. 3. Atrial fibrillation: Chronic, s/p AVN ablation.  Continue warfarin per pharmacy. Goal 2.0-3.0 for INR 4. CKD: Creatinine up a bit today, continue current po torsemide and recheck tomorrow.  If rise continues, cut back.   5. Foot pain: No fracture on imaging.  On prednisone, ?gout.    6. Deconditioning. Rehab services much appreciated.   Dalton McLean,MD 7:59 AM  04/18/2014

## 2014-04-19 ENCOUNTER — Inpatient Hospital Stay (HOSPITAL_COMMUNITY): Payer: Medicare Other | Admitting: Physical Therapy

## 2014-04-19 ENCOUNTER — Inpatient Hospital Stay (HOSPITAL_COMMUNITY): Payer: Medicare Other | Admitting: Occupational Therapy

## 2014-04-19 ENCOUNTER — Inpatient Hospital Stay (HOSPITAL_COMMUNITY): Payer: Medicare Other

## 2014-04-19 ENCOUNTER — Encounter: Payer: Self-pay | Admitting: Internal Medicine

## 2014-04-19 LAB — PROTIME-INR
INR: 2.79 — ABNORMAL HIGH (ref 0.00–1.49)
Prothrombin Time: 29.6 seconds — ABNORMAL HIGH (ref 11.6–15.2)

## 2014-04-19 LAB — BASIC METABOLIC PANEL
ANION GAP: 6 (ref 5–15)
BUN: 39 mg/dL — AB (ref 6–23)
CO2: 37 mmol/L — ABNORMAL HIGH (ref 19–32)
CREATININE: 2.02 mg/dL — AB (ref 0.50–1.35)
Calcium: 9 mg/dL (ref 8.4–10.5)
Chloride: 91 mmol/L — ABNORMAL LOW (ref 96–112)
GFR calc non Af Amer: 36 mL/min — ABNORMAL LOW (ref >90)
GFR, EST AFRICAN AMERICAN: 42 mL/min — AB (ref >90)
Glucose, Bld: 131 mg/dL — ABNORMAL HIGH (ref 70–99)
Potassium: 3.3 mmol/L — ABNORMAL LOW (ref 3.5–5.1)
Sodium: 134 mmol/L — ABNORMAL LOW (ref 135–145)

## 2014-04-19 LAB — MAGNESIUM: Magnesium: 1.9 mg/dL (ref 1.5–2.5)

## 2014-04-19 MED ORDER — PREDNISONE 10 MG PO TABS: 10.0000 mg | ORAL_TABLET | Freq: Every day | ORAL | Status: AC

## 2014-04-19 MED ORDER — TORSEMIDE 20 MG PO TABS
80.0000 mg | ORAL_TABLET | Freq: Two times a day (BID) | ORAL | Status: DC
  Filled 2014-04-19 (×3): qty 4

## 2014-04-19 MED ORDER — MAGNESIUM SULFATE IN D5W 10-5 MG/ML-% IV SOLN
1.0000 g | Freq: Once | INTRAVENOUS | Status: AC
  Administered 2014-04-19: 1 g via INTRAVENOUS
  Filled 2014-04-19: qty 100

## 2014-04-19 MED ORDER — SPIRONOLACTONE 25 MG PO TABS: 25.0000 mg | ORAL_TABLET | Freq: Two times a day (BID) | ORAL | Status: AC

## 2014-04-19 MED ORDER — WARFARIN SODIUM 2.5 MG PO TABS
2.5000 mg | ORAL_TABLET | Freq: Once | ORAL | Status: AC
  Administered 2014-04-19: 2.5 mg via ORAL
  Filled 2014-04-19: qty 1

## 2014-04-19 MED ORDER — MILRINONE IN DEXTROSE 20 MG/100ML IV SOLN: 0.3750 ug/kg/min | INTRAVENOUS | Status: DC

## 2014-04-19 MED ORDER — WARFARIN SODIUM 5 MG PO TABS: 5.0000 mg | ORAL_TABLET | Freq: Every day | ORAL | Status: AC

## 2014-04-19 MED ORDER — SENNOSIDES-DOCUSATE SODIUM 8.6-50 MG PO TABS: 2.0000 | ORAL_TABLET | Freq: Every day | ORAL | Status: AC

## 2014-04-19 MED ORDER — POTASSIUM CHLORIDE CRYS ER 20 MEQ PO TBCR: 40.0000 meq | EXTENDED_RELEASE_TABLET | Freq: Two times a day (BID) | ORAL | Status: DC

## 2014-04-19 MED ORDER — POTASSIUM CHLORIDE ER 20 MEQ PO TBCR: 20.0000 meq | EXTENDED_RELEASE_TABLET | Freq: Two times a day (BID) | ORAL | Status: DC

## 2014-04-19 MED ORDER — TORSEMIDE 20 MG PO TABS
80.0000 mg | ORAL_TABLET | Freq: Two times a day (BID) | ORAL | Status: DC
  Filled 2014-04-19 (×2): qty 4

## 2014-04-19 MED ORDER — MILRINONE IN DEXTROSE 20 MG/100ML IV SOLN: 0.3750 ug/kg/min | INTRAVENOUS | Status: AC

## 2014-04-19 MED ORDER — POTASSIUM CHLORIDE CRYS ER 20 MEQ PO TBCR
20.0000 meq | EXTENDED_RELEASE_TABLET | Freq: Two times a day (BID) | ORAL | Status: DC
  Administered 2014-04-20: 20 meq via ORAL
  Filled 2014-04-19 (×3): qty 1

## 2014-04-19 MED ORDER — POTASSIUM CHLORIDE CRYS ER 20 MEQ PO TBCR
40.0000 meq | EXTENDED_RELEASE_TABLET | Freq: Two times a day (BID) | ORAL | Status: AC
  Administered 2014-04-19 (×2): 40 meq via ORAL
  Filled 2014-04-19: qty 2

## 2014-04-19 NOTE — Progress Notes (Signed)
Physical Therapy Session Note  Patient Details  Name: Raymond [name] MRN: [id number] Date of Birth: 12-28-1960  Today's Date: 04/19/2014 PT Individual Time: 1300-1400 PT Individual Time Calculation (min): 60 min   Short Term Goals: Week 1:  PT Short Term Goal 1 (Week 1): STGs = LTGs due to ELOS  Skilled Therapeutic Interventions/Progress Updates:    Therapeutic Activity: Pt demonstrates mod I bed mobility supine to sit from flat bed without rail and mod I stand-step transfer bed to w/c with SPC. Pt transfers bed to w/c mod I with SPC and transfers sit to supine mod I. Pt transfers easy chair with armrests to w/c with SPC mod I.   Community Integration: PT instructs pt in ambulation in community setting x 25' with SPC req CGA due to pt unsteadiness on his feet - pt reports he slept poorly last night.   W/C Management: PT instructs pt in w/c propulsion using R UE/LE x 50' req mod A for steering. Pt continues to req assist for w/c parts management.   Orthotic Fit/Train: PT hands pt the aircast and pt is able to doff aircast in sitting position, but unable to don it due to stickiness of velcro and poor fine motor control in L hand.   Pt had poorer attitude during PT session today and fatigued more quickly, reporting that he was up all night urinating because "the doctors messed up on my meds again". Pt to receive family training with girlfriend for stairs and car transfer tomorrow before d/c home. Continue per PT POC.     Therapy Documentation Precautions:  Precautions Precautions: Fall, ICD/Pacemaker, Other (comment) Precaution Comments: pacemaker placed 5 years ago; L side hemiplegia: L arm weaker than L leg Restrictions Weight Bearing Restrictions: No Vital Signs: Therapy Vitals Pulse Rate: 69 BP: 114/73 mmHg Patient Position (if appropriate): Sitting Oxygen Therapy SpO2: 97 % O2 Device: Not Delivered Pain: Pain Assessment Pain Assessment: No/denies pain  See FIM for  current functional status  Therapy/Group: Individual Therapy  [name] M 04/19/2014, 1:10 PM

## 2014-04-19 NOTE — Progress Notes (Signed)
4/1  2000 Pt refused orthostatic vs tonight. Stating "they did that all day today in therapy and i dont need it now".  KM

## 2014-04-19 NOTE — Progress Notes (Signed)
ANTICOAGULATION CONSULT NOTE - Follow Up Consult  Pharmacy Consult for coumadin Indication: afib and CVA  Allergies  Allergen Reactions  . Valsartan Cough    Patient Measurements: Height: 5\' 9"  (175.3 cm) Weight: 256 lb 3.2 oz (116.212 kg) IBW/kg (Calculated) : 70.7 Heparin Dosing Weight:   Vital Signs: Temp: 98.2 F (36.8 C) (04/01 0522) Temp Source: Oral (04/01 0522) BP: 102/72 mmHg (04/01 0522) Pulse Rate: 72 (04/01 0522)  Labs:  Recent Labs  04/17/14 0653 04/18/14 0555 04/19/14 0515  HGB 13.2  --   --   HCT 36.2*  --   --   PLT 385  --   --   LABPROT 24.5* 26.2* 29.6*  INR 2.19* 2.39* 2.79*  CREATININE 1.75* 1.93* 2.02*    Estimated Creatinine Clearance: 54.4 mL/min (by C-G formula based on Cr of 2.02).   Medications:  Scheduled:  . acyclovir  400 mg Oral q morning - 10a  . amiodarone  200 mg Oral BID  . cyclobenzaprine  10 mg Oral BID  . doxycycline  100 mg Oral Q12H  . levothyroxine  250 mcg Oral QAC breakfast  . magnesium oxide  400 mg Oral Daily  . magnesium sulfate 1 - 4 g bolus IVPB  1 g Intravenous Once  . [START ON 04/20/2014] potassium chloride  20 mEq Oral BID  . potassium chloride  40 mEq Oral BID  . predniSONE  10 mg Oral Q breakfast  . ranolazine  1,000 mg Oral BID  . senna-docusate  2 tablet Oral QHS  . spironolactone  25 mg Oral BID  . [START ON 04/20/2014] torsemide  80 mg Oral BID  . warfarin  5 mg Oral q1800  . Warfarin - Pharmacist Dosing Inpatient   Does not apply q1800   Infusions:  . milrinone 0.375 mcg/kg/min (04/19/14 0732)    Assessment: 54 yo male with afib and CVA is currently on therapeutic coumadin but INR up to 2.79 from 2.39 today.  Goal of Therapy:  INR 2-3 Monitor platelets by anticoagulation protocol: Yes   Plan:  d/c coumadin 5mg  daily; coumadin 2.5 mg po x1 tonight (may need 5 mg most days and 2.5 mg few days a week) - Daily PT / INR  Maybell Misenheimer, Tsz-Yin 04/19/2014,8:38 AM

## 2014-04-19 NOTE — Progress Notes (Signed)
Patient ID: Raymond [name], male   DOB: 1960/03/10, 54 y.o.   MRN: [id number]  54 yo with history of nonischemic cardiomyopathy, EF 10-15% by last echo and St Jude CRT-D device, chronic atrial fibrillation s/p AV nodal ablation, h/o VT, CKD, CVA presented with VT/VF and syncope.  At last office visit, torsemide was increased and amiodarone was decreased to 200 daily.  He was hypokalemic at admission.    Admitted to [facility] after he was shocked for VT on the evening of 3/12 and again on the evening of 3/13.  Seen by Dr Graciela Husbands and started on ranolazine.  Remains on milrinone at 0.375. Creatinine stable.  He was transferred to inpatient rehab on March 22nd on milrinone at 0.375 mcg. Discharge weight was 265 pounds.   Walking with rehab, overall seems to be doing better.   Weight down again with good diuresis yesterday.  Creatinine now up to 2. Denies dyspnea.   Scheduled Meds: . acyclovir  400 mg Oral q morning - 10a  . amiodarone  200 mg Oral BID  . cyclobenzaprine  10 mg Oral BID  . doxycycline  100 mg Oral Q12H  . levothyroxine  250 mcg Oral QAC breakfast  . magnesium oxide  400 mg Oral Daily  . magnesium sulfate 1 - 4 g bolus IVPB  1 g Intravenous Once  . potassium chloride  40 mEq Oral BID  . predniSONE  10 mg Oral Q breakfast  . ranolazine  1,000 mg Oral BID  . senna-docusate  2 tablet Oral QHS  . spironolactone  25 mg Oral BID  . [START ON 04/20/2014] torsemide  80 mg Oral BID  . warfarin  5 mg Oral q1800  . Warfarin - Pharmacist Dosing Inpatient   Does not apply q1800   Continuous Infusions: . milrinone 0.375 mcg/kg/min (04/19/14 0732)   PRN Meds:.acetaminophen, acetaminophen, ALPRAZolam, alum & mag hydroxide-simeth, bisacodyl, diphenhydrAMINE, guaiFENesin-codeine, nitroGLYCERIN, ondansetron **OR** ondansetron (ZOFRAN) IV, oxyCODONE-acetaminophen, sodium chloride, sorbitol   Filed Vitals:   04/18/14 1300 04/18/14 1411 04/18/14 2011 04/19/14 0522  BP: 98/70 148/82 112/68 102/72   Pulse: 69  64 72  Temp: 98.5 F (36.9 C)  98 F (36.7 C) 98.2 F (36.8 C)  TempSrc: Oral  Oral Oral  Resp:   18 18  Height:      Weight:    256 lb 3.2 oz (116.212 kg)  SpO2: 98%  96% 95%    Intake/Output Summary (Last 24 hours) at 04/19/14 0802 Last data filed at 04/19/14 0600  Gross per 24 hour  Intake   1200 ml  Output   2450 ml  Net  -1250 ml    LABS: Basic Metabolic Panel:  Recent Labs  03/70/96 0555 04/19/14 0515  NA 133* 134*  K 3.3* 3.3*  CL 91* 91*  CO2 30 37*  GLUCOSE 136* 131*  BUN 33* 39*  CREATININE 1.93* 2.02*  CALCIUM 8.8 9.0  MG  --  1.9   Liver Function Tests: No results for input(s): AST, ALT, ALKPHOS, BILITOT, PROT, ALBUMIN in the last 72 hours. No results for input(s): LIPASE, AMYLASE in the last 72 hours. CBC:  Recent Labs  04/17/14 0653  WBC 11.3*  HGB 13.2  HCT 36.2*  MCV 90.7  PLT 385   Cardiac Enzymes: No results for input(s): CKTOTAL, CKMB, CKMBINDEX, TROPONINI in the last 72 hours. BNP: Invalid input(s): POCBNP D-Dimer: No results for input(s): DDIMER in the last 72 hours. Hemoglobin A1C: No results for input(s): HGBA1C  in the last 72 hours. Fasting Lipid Panel: No results for input(s): CHOL, HDL, LDLCALC, TRIG, CHOLHDL, LDLDIRECT in the last 72 hours. Thyroid Function Tests: No results for input(s): TSH, T4TOTAL, T3FREE, THYROIDAB in the last 72 hours.  Invalid input(s): FREET3 Anemia Panel: No results for input(s): VITAMINB12, FOLATE, FERRITIN, TIBC, IRON, RETICCTPCT in the last 72 hours.  RADIOLOGY: Dg Chest 2 View  03/28/2014   CLINICAL DATA:  Shortness of breath since last night.  EXAM: CHEST  2 VIEW  COMPARISON:  02/18/2014  FINDINGS: Pacer/AICD device.  A right internal jugular line terminates over the internal jugular vein. Midline trachea. Moderate cardiomegaly. No pleural effusion or pneumothorax. Mild interstitial edema. No lobar consolidation.  IMPRESSION: Mild congestive heart failure.  Right internal  jugular line with tip projecting over the low right neck. Presuming low SVC position is desired, this should be advanced approximately 11 cm.   Electronically Signed   By: Jeronimo Greaves M.D.   On: 03/28/2014 20:47   Dg Chest Port 1 View  04/02/2014   CLINICAL DATA:  Central line placement  EXAM: PORTABLE CHEST - 1 VIEW  COMPARISON:  03/30/2014  FINDINGS: Marked cardiac enlargement. Pulmonary vascular congestion and mild interstitial edema is unchanged. No significant effusion.  Right arm PICC tip enters the SVC with the tip not well visualized due to pacemaker leads.  Right jugular catheter tip overlies the neck at the cervical thoracic junction possibly and external jugular branch. This is unchanged.  IMPRESSION: Congestive heart failure with mild edema  Right arm PICC tip enters the SVC with the tip not seen due to overlying pacemaker leads  Right external jugular catheter tip remains in the lower neck unchanged from the prior study.   Electronically Signed   By: Marlan Palau M.D.   On: 04/02/2014 11:50   Dg Chest Port 1 View  03/30/2014   CLINICAL DATA:  54 year old male with a history of syncope. Report of AICD discharge  EXAM: PORTABLE CHEST - 1 VIEW  COMPARISON:  03/28/2014, fluoroscopy 02/21/2014  FINDINGS: Cardiomediastinal silhouette is similar to prior with cardiomegaly.  Central vascular congestion with right hilar opacities.  No pneumothorax.  No large pleural effusion. Retrocardiac region not well evaluated given the EKG leads, defibrillator pads, and overlying soft tissues of the chest wall.  Unchanged position of AICD generator on the left chest wall with 3 leads in place.  Right IJ tunneled central catheter has been withdrawn to the cervical internal jugular vein since the placement 02/21/2014.  IMPRESSION: Evidence of pulmonary vascular congestion/developing edema.  Cardiomegaly.  Interval withdrawal of the right IJ central venous catheter into the cervical internal jugular vein.  Overlying  defibrillator pads.  Signed,  Yvone Neu. Loreta Ave, DO  [facility]  [facility]   Electronically Signed   By: Gilmer Mor D.O.   On: 03/30/2014 11:33   Dg Foot Complete Right  04/03/2014   CLINICAL DATA:  Syncope and fall, injuring right foot 3 days ago  EXAM: RIGHT FOOT COMPLETE - 3+ VIEW  COMPARISON:  None.  FINDINGS: There is no evidence of fracture or dislocation. There is no evidence of arthropathy or other focal bone abnormality. Soft tissues are unremarkable.  IMPRESSION: Negative.   Electronically Signed   By: Ellery Plunk M.D.   On: 04/03/2014 00:51    PHYSICAL EXAM  General: NAD Neck: JVP 7 cm, no thyromegaly or thyroid nodule.  Lungs: Clear to auscultation bilaterally with normal respiratory effort. CV: Nondisplaced PMI.  Heart irregular S1/S2 (frequent PVCs), no S3/S4, no murmur.  No edema.  No carotid bruit.  Normal pedal pulses.  Abdomen: obese, Soft, nontender, no hepatosplenomegaly, no distention.  Neurologic: Alert and oriented x 3.  Psych: Normal affect. Extremities: No clubbing or cyanosis.  R foot erythematous (improving) and less tender to touch  ASSESSMENT AND PLAN: 54 yo with history of nonischemic cardiomyopathy, EF 10-15% by last echo and St Jude CRT-D device, chronic atrial fibrillation s/p AV nodal ablation, h/o VT, CKD, CVA and VT/VF and syncope. On chronic milrinone 0.375 mcg.  1. VT: 03/30/2014 Shock.   - Continue po amiodarone 200 mg twice a day  - Continue ranolazine 1000 bid.  - Continue spironolactone 25 mg bid.  - Replace K.    2. Chronic systolic CHF: EF 16-10% on 1/16 echo, nonischemic cardiomyopathy.  St Jude CRT-D device.  Not good candidate for advanced therapies, has no caregiver at home. On home milrinone at 0.375.  Volume status better today, weight is down again today but creatinine up.  - Seems to be diuresing a bit too vigorously on torsemide 100 bid.  Will cut back to 80 mg bid tomorrow and hold  torsemide today.   - Continue home milrinone at 0.375. 3. Atrial fibrillation: Chronic, s/p AVN ablation.  Continue warfarin per pharmacy. Goal 2.0-3.0 for INR 4. CKD: Creatinine up again, hold torsemide today and restart tomorrow. 5. Foot pain: No fracture on imaging.  On prednisone, ?gout.    6. Deconditioning. Rehab services much appreciated.   Alastair Hennes,MD 8:02 AM  04/19/2014

## 2014-04-19 NOTE — Progress Notes (Signed)
Orthopedic Tech Progress Note Patient Details:  Raymond [name] 01-16-1961 [id number] Ankle aircast splint delivered to patient's room. Patient not in room at time of delivery. Nursing notified to call ortho tech for application when patient returns or have therapy apply aircast splint for patient.   Ortho Devices Type of Ortho Device: Ankle Air splint Ortho Device/Splint Location: RLE Ortho Device/Splint Interventions: Ordered   [place] R Thompson 04/19/2014, 1:50 PM
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
## 2014-04-19 NOTE — Progress Notes (Signed)
Occupational Therapy Session Note  Patient Details  Name: Raymond [name] MRN: [id number] Date of Birth: Jun 10, 1960  Today's Date: 04/19/2014   OT Individual Time   1600- 1630  (30 min) : OT Individual Time Calculation (min): 30 min    Short Term Goals: Week 1:  OT Short Term Goal 1 (Week 1): STG=LTG due to short length of stay.       Skilled Therapeutic Interventions/Progress Updates:      Pt sat on EOB during session.  Addressed LUE mobilization and exercises  for emphasis on external, abduction and extension.  Educated pt on exercise to do with left thumb to exercise in extension and abduction.  Pt. Returned demonstration.  Discussed foam board and Bioness for future treatments.    Left pt on EOB at end of session.    Therapy Documentation Precautions:  Precautions Precautions: Fall, ICD/Pacemaker, Other (comment) Precaution Comments: pacemaker placed 5 years ago; L side hemiplegia: L arm weaker than L leg Restrictions Weight Bearing Restrictions: No      Pain: Pain Assessment Pain Assessment: No/denies pain ADL: ADL ADL Comments: see FIM        See FIM for current functional status  Therapy/Group: Individual Therapy  Humberto Seals 04/19/2014, 4:24 PM

## 2014-04-19 NOTE — Progress Notes (Signed)
Occupational Therapy Session Note  Patient Details  Name: Oslo Mulhearn MRN: [id number] Date of Birth: 1960/09/22  Today's Date: 04/19/2014 OT Individual Time: 2595-6387 OT Individual Time Calculation (min): 45 min    Short Term Goals: Week 1:  OT Short Term Goal 1 (Week 1): STG=LTG due to short length of stay.   Skilled Therapeutic Interventions/Progress Updates: ADL-retraining with focus on discharge planning, performance of pan bath sitting/standing at sink, and reinforcement training and review of adapted bathing/toileting skills.   With only setup to provide supplies, pt completed sink bath, requesting assist to wash and apply powder to his back.   Pt c/o fatigue throughout session d/t poor sleep but proceeded with multiple rest breaks.   Pt able to stand to wash peri-area and buttocks.    Pt states his girlfriend assists with bathing on the weekend but he confides that he is able to complete task unassisted at edge of bed by laying supine in bed to reach skin folds under his abdomin.   Pt progressed through task however declined dressing d/t fatigue and terminated session early.   Pt agreed to attempt dressing task during f/u visit prior to lunch.     Therapy Documentation Precautions:  Precautions Precautions: Fall, ICD/Pacemaker, Other (comment) Precaution Comments: pacemaker placed 5 years ago; L side hemiplegia: L arm weaker than L leg Restrictions Weight Bearing Restrictions: No   General: General OT Amount of Missed Time: 15 Minutes  Vital Signs: Therapy Vitals Temp: 98.2 F (36.8 C) Temp Source: Oral Pulse Rate: 72 Resp: 18 BP: 102/72 mmHg Patient Position (if appropriate): Lying Oxygen Therapy SpO2: 95 % O2 Device: Not Delivered   Pain: Pain Assessment Pain Assessment: No/denies pain    See FIM for current functional status  Therapy/Group: Individual Therapy   Second session: Time: 5643-3295  Time Calculation (min):  30 min + 15 min makeup session   (45 min total time)  Pain Assessment: No/denies pain  Skilled Therapeutic Interventions: ADL-retraining with focus on dressing skills using AE while sitting at EOB as per pt clarification of his preferred method of dressing.   As requested by OT, pt's significant other delivered appropriate size of T-shirt to allow trial of lacing IV though sleeves.   Pt required setup assist to gather and prepare clothing (to release knot in his pants) but reports that he confident he is able to gather his own clothing at home w/o impairment. With extra time and only setup assist to manage IV pole pt dressed lower body unassisted at edge of bed using reacher and standing to pull up pants w/o evidence of LE weakness or LOB.    Pt required min assist to don T-shirt however he is aware process will be simplified s/p discharge.   Neither OT nor RN were able to simulate home IV setup during this admission.    Pt returned to supine in bed for recovery after prolonged session, complicated by numerous IV lines (4) suspended on IV pole.   See FIM for current functional status  Therapy/Group: Individual Therapy  Lucas Winograd 04/19/2014, 8:17 AM

## 2014-04-19 NOTE — Discharge Instructions (Signed)
Inpatient Rehab Discharge Instructions  Meric Joye Discharge date and time: 04/20/14   Activities/Precautions/ Functional Status: Activity: activity as tolerated Diet: cardiac diet Low salt Wound Care: none needed Functional status:  ___ No restrictions     ___ Walk up steps independently ___ 24/7 supervision/assistance   ___ Walk up steps with assistance _X__ Intermittent supervision/assistance  ___ Bathe/dress independently ___ Walk with walker     ___ Bathe/dress with assistance ___ Walk Independently    ___ Shower independently ___ Walk with assistance    ___ Shower with assistance ___ No alcohol     ___ Return to work/school ________     COMMUNITY REFERRALS UPON DISCHARGE:    Home Health:   PT     OT      RN      SW                  Agency: [facility] Phone: [phone]    Special Instructions: 1. Weight yourself daily.    My questions have been answered and I understand these instructions. I will adhere to these goals and the provided educational materials after my discharge from the hospital.  Patient/Caregiver Signature _______________________________ Date __________  Clinician Signature _______________________________________ Date __________  Please bring this form and your medication list with you to all your follow-up doctor's appointments.     Heart Failure Heart failure is a condition in which the heart has trouble pumping blood. This means your heart does not pump blood efficiently for your body to work well. In some cases of heart failure, fluid may back up into your lungs or you may have swelling (edema) in your lower legs. Heart failure is usually a long-term (chronic) condition. It is important for you to take good care of yourself and follow your health care provider's treatment plan. CAUSES  Some health conditions can cause heart failure. Those health conditions include:  High blood pressure (hypertension). Hypertension causes the heart  muscle to work harder than normal. When pressure in the blood vessels is high, the heart needs to pump (contract) with more force in order to circulate blood throughout the body. High blood pressure eventually causes the heart to become stiff and weak.  Coronary artery disease (CAD). CAD is the buildup of cholesterol and fat (plaque) in the arteries of the heart. The blockage in the arteries deprives the heart muscle of oxygen and blood. This can cause chest pain and may lead to a heart attack. High blood pressure can also contribute to CAD.  Heart attack (myocardial infarction). A heart attack occurs when one or more arteries in the heart become blocked. The loss of oxygen damages the muscle tissue of the heart. When this happens, part of the heart muscle dies. The injured tissue does not contract as well and weakens the heart's ability to pump blood.  Abnormal heart valves. When the heart valves do not open and close properly, it can cause heart failure. This makes the heart muscle pump harder to keep the blood flowing.  Heart muscle disease (cardiomyopathy or myocarditis). Heart muscle disease is damage to the heart muscle from a variety of causes. These can include drug or alcohol abuse, infections, or unknown reasons. These can increase the risk of heart failure.  Lung disease. Lung disease makes the heart work harder because the lungs do not work properly. This can cause a strain on the heart, leading it to fail.  Diabetes. Diabetes increases the risk of heart failure.  High blood sugar contributes to high fat (lipid) levels in the blood. Diabetes can also cause slow damage to tiny blood vessels that carry important nutrients to the heart muscle. When the heart does not get enough oxygen and food, it can cause the heart to become weak and stiff. This leads to a heart that does not contract efficiently.  Other conditions can contribute to heart failure. These include abnormal heart rhythms, thyroid  problems, and low blood counts (anemia). Certain unhealthy behaviors can increase the risk of heart failure, including:  Being overweight.  Smoking or chewing tobacco.  Eating foods high in fat and cholesterol.  Abusing illicit drugs or alcohol.  Lacking physical activity. SYMPTOMS  Heart failure symptoms may vary and can be hard to detect. Symptoms may include:  Shortness of breath with activity, such as climbing stairs.  Persistent cough.  Swelling of the feet, ankles, legs, or abdomen.  Unexplained weight gain.  Difficulty breathing when lying flat (orthopnea).  Waking from sleep because of the need to sit up and get more air.  Rapid heartbeat.  Fatigue and loss of energy.  Feeling light-headed, dizzy, or close to fainting.  Loss of appetite.  Nausea.  Increased urination during the night (nocturia). DIAGNOSIS  A diagnosis of heart failure is based on your history, symptoms, physical examination, and diagnostic tests. Diagnostic tests for heart failure may include:  Echocardiography.  Electrocardiography.  Chest X-ray.  Blood tests.  Exercise stress test.  Cardiac angiography.  Radionuclide scans. TREATMENT  Treatment is aimed at managing the symptoms of heart failure. Medicines, behavioral changes, or surgical intervention may be necessary to treat heart failure.  Medicines to help treat heart failure may include:  Angiotensin-converting enzyme (ACE) inhibitors. This type of medicine blocks the effects of a blood protein called angiotensin-converting enzyme. ACE inhibitors relax (dilate) the blood vessels and help lower blood pressure.  Angiotensin receptor blockers (ARBs). This type of medicine blocks the actions of a blood protein called angiotensin. Angiotensin receptor blockers dilate the blood vessels and help lower blood pressure.  Water pills (diuretics). Diuretics cause the kidneys to remove salt and water from the blood. The extra fluid is  removed through urination. This loss of extra fluid lowers the volume of blood the heart pumps.  Beta blockers. These prevent the heart from beating too fast and improve heart muscle strength.  Digitalis. This increases the force of the heartbeat.  Healthy behavior changes include:  Obtaining and maintaining a healthy weight.  Stopping smoking or chewing tobacco.  Eating heart-healthy foods.  Limiting or avoiding alcohol.  Stopping illicit drug use.  Physical activity as directed by your health care provider.  Surgical treatment for heart failure may include:  A procedure to open blocked arteries, repair damaged heart valves, or remove damaged heart muscle tissue.  A pacemaker to improve heart muscle function and control certain abnormal heart rhythms.  An internal cardioverter defibrillator to treat certain serious abnormal heart rhythms.  A left ventricular assist device (LVAD) to assist the pumping ability of the heart. HOME CARE INSTRUCTIONS   Take medicines only as directed by your health care provider. Medicines are important in reducing the workload of your heart, slowing the progression of heart failure, and improving your symptoms.  Do not stop taking your medicine unless directed by your health care provider.  Do not skip any dose of medicine.  Refill your prescriptions before you run out of medicine. Your medicines are needed every day.  Engage in moderate physical  activity if directed by your health care provider. Moderate physical activity can benefit some people. The elderly and people with severe heart failure should consult with a health care provider for physical activity recommendations.  Eat heart-healthy foods. Food choices should be free of trans fat and low in saturated fat, cholesterol, and salt (sodium). Healthy choices include fresh or frozen fruits and vegetables, fish, lean meats, legumes, fat-free or low-fat dairy products, and whole grain or high  fiber foods. Talk to a dietitian to learn more about heart-healthy foods.  Limit sodium if directed by your health care provider. Sodium restriction may reduce symptoms of heart failure in some people. Talk to a dietitian to learn more about heart-healthy seasonings.  Use healthy cooking methods. Healthy cooking methods include roasting, grilling, broiling, baking, poaching, steaming, or stir-frying. Talk to a dietitian to learn more about healthy cooking methods.  Limit fluids if directed by your health care provider. Fluid restriction may reduce symptoms of heart failure in some people.  Weigh yourself every day. Daily weights are important in the early recognition of excess fluid. You should weigh yourself every morning after you urinate and before you eat breakfast. Wear the same amount of clothing each time you weigh yourself. Record your daily weight. Provide your health care provider with your weight record.  Monitor and record your blood pressure if directed by your health care provider.  Check your pulse if directed by your health care provider.  Lose weight if directed by your health care provider. Weight loss may reduce symptoms of heart failure in some people.  Stop smoking or chewing tobacco. Nicotine makes your heart work harder by causing your blood vessels to constrict. Do not use nicotine gum or patches before talking to your health care provider.  Keep all follow-up visits as directed by your health care provider. This is important.  Limit alcohol intake to no more than 1 drink per day for nonpregnant women and 2 drinks per day for men. One drink equals 12 ounces of beer, 5 ounces of wine, or 1 ounces of hard liquor. Drinking more than that is harmful to your heart. Tell your health care provider if you drink alcohol several times a week. Talk with your health care provider about whether alcohol is safe for you. If your heart has already been damaged by alcohol or you have  severe heart failure, drinking alcohol should be stopped completely.  Stop illicit drug use.  Stay up-to-date with immunizations. It is especially important to prevent respiratory infections through current pneumococcal and influenza immunizations.  Manage other health conditions such as hypertension, diabetes, thyroid disease, or abnormal heart rhythms as directed by your health care provider.  Learn to manage stress.  Plan rest periods when fatigued.  Learn strategies to manage high temperatures. If the weather is extremely hot:  Avoid vigorous physical activity.  Use air conditioning or fans or seek a cooler location.  Avoid caffeine and alcohol.  Wear loose-fitting, lightweight, and light-colored clothing.  Learn strategies to manage cold temperatures. If the weather is extremely cold:  Avoid vigorous physical activity.  Layer clothes.  Wear mittens or gloves, a hat, and a scarf when going outside.  Avoid alcohol.  Obtain ongoing education and support as needed.  Participate in or seek rehabilitation as needed to maintain or improve independence and quality of life. SEEK MEDICAL CARE IF:   Your weight increases by 03 lb/1.4 kg in 1 day or 05 lb/2.3 kg in a week.  You  have increasing shortness of breath that is unusual for you.  You are unable to participate in your usual physical activities.  You tire easily.  You cough more than normal, especially with physical activity.  You have any or more swelling in areas such as your hands, feet, ankles, or abdomen.  You are unable to sleep because it is hard to breathe.  You feel like your heart is beating fast (palpitations).  You become dizzy or light-headed upon standing up. SEEK IMMEDIATE MEDICAL CARE IF:   You have difficulty breathing.  There is a change in mental status such as decreased alertness or difficulty with concentration.  You have a pain or discomfort in your chest.  You have an episode of  fainting (syncope). MAKE SURE YOU:   Understand these instructions.  Will watch your condition.  Will get help right away if you are not doing well or get worse. Document Released: 01/04/2005 Document Revised: 05/21/2013 Document Reviewed: 02/04/2012 [facility] Patient Information 2015 [place], [place]. This information is not intended to replace advice given to you by your health care provider. Make sure you discuss any questions you have with your health care provider.

## 2014-04-19 NOTE — Progress Notes (Signed)
[facility] PHYSICAL [facility]IC[facility] & REHABILITATION     PROGRESS NOTE    Subjective/Complaints: Feeling better in general. Still concerned over line being in his right arm once he goes home  Objective: Vital Signs: Blood pressure 103/68, pulse 72, temperature 98.2 F (36.8 C), temperature source Oral, resp. rate 18, height 5\' 9"  (1.753 m), weight 116.212 kg (256 lb 3.2 oz), SpO2 95 %. No results found.  Recent Labs  04/17/14 0653  WBC 11.3*  HGB 13.2  HCT 36.2*  PLT 385    Recent Labs  04/18/14 0555 04/19/14 0515  NA 133* 134*  K 3.3* 3.3*  CL 91* 91*  GLUCOSE 136* 131*  BUN 33* 39*  CREATININE 1.93* 2.02*  CALCIUM 8.8 9.0   CBG (last 3)  No results for input(s): GLUCAP in the last 72 hours.  Wt Readings from Last 3 Encounters:  04/19/14 116.212 kg (256 lb 3.2 oz)  04/09/14 120.611 kg (265 lb 14.4 oz)  03/27/14 122.925 kg (271 lb)    Physical Exam:  Constitutional: He is oriented to person, place, and time. He appears well-developed and well-nourished.  HENT: dentition fair. Mucosa moist Head: Normocephalic and atraumatic.  Eyes: Conjunctivae and EOM are normal. Pupils are equal, round, and reactive to light.  Neck: No tracheal deviation present. No thyromegaly present.  Cardiovascular: Normal rate and regular rhythm.    No murmur heard. Respiratory: Effort normal and breath sounds normal. No respiratory distress. An occasional upper airway sound during expiration.  GI: Soft. Bowel sounds are normal. He exhibits no distension. There is no tenderness.  Musculoskeletal: He exhibits minimal edema or tenderness.  Right foot with less erythema. minimal pain with ROM. mild pain with palpation over the mid foot. Neurological: He is alert and oriented to person, place, and time.  Slow measured speech. He was able to follow basic commands without difficulty. Left hemiparesis UE> LE with sensory deficits. LUE 2+ to 3-/5. LLE: 3/5 HF, 4-KE and 2+ aDF, 3+apf, RUE and  RLE 4 to 4+/5, some limitations with movement of right foot. Mild left facial droop. Speech clear.  Skin: Skin is warm and dry.  No breakdown Psychiatric: anxious.   .    Assessment/Plan: 1. Functional deficits secondary to debility after chf/cellulitis which require 3+ hours per day of interdisciplinary therapy in a comprehensive inpatient rehab setting. Physiatrist is providing close team supervision and 24 hour management of active medical problems listed below. Physiatrist and rehab team continue to assess barriers to discharge/monitor patient progress toward functional and medical goals.  Will ask HH to review maintenance of cathter at home with patient to help reassure him. OT is working on logistics of don/doff of shirt with line in place   FIM: FIM - Bathing Bathing Steps Patient Completed: Chest, Right Arm, Left Arm, Abdomen, Front perineal area, Buttocks, Right upper leg, Left upper leg, Right lower leg (including foot), Left lower leg (including foot) Bathing: 6: Assistive device (Comment) (LH sponge)  FIM - Upper Body Dressing/Undressing Upper body dressing/undressing steps patient completed: Thread/unthread right sleeve of pullover shirt/dresss, Thread/unthread left sleeve of pullover shirt/dress, Put head through opening of pull over shirt/dress Upper body dressing/undressing: 0: Wears gown/pajamas-no public clothing FIM - Lower Body Dressing/Undressing Lower body dressing/undressing steps patient completed: Don/Doff right shoe, Don/Doff left shoe Lower body dressing/undressing: 5: Supervision: Safety issues/verbal cues  FIM - Toileting Toileting steps completed by patient: Adjust clothing prior to toileting, Performs perineal hygiene, Adjust clothing after toileting Toileting Assistive Devices: Grab bar or  rail for support Toileting: 5: Set-up assist to: Obtain supplies  FIM - Diplomatic Services operational officer Devices: Bedside commode Toilet Transfers: 6-To  toilet/ BSC, 6-From toilet/BSC  FIM - Banker Devices: Teacher, music: 6: Supine > Sit: No assist, 6: Bed > Chair or W/C: No assist  FIM - Locomotion: Wheelchair Distance: 40 Locomotion: Wheelchair: 1: Total Assistance/staff pushes wheelchair (Pt<25%) FIM - Locomotion: Ambulation Locomotion: Ambulation Assistive Devices: Emergency planning/management officer Ambulation/Gait Assistance: 5: Supervision Locomotion: Ambulation: 1: Travels less than 50 ft with supervision/safety issues  Comprehension Comprehension Mode: Auditory Comprehension: 5-Understands complex 90% of the time/Cues < 10% of the time  Expression Expression Mode: Verbal Expression: 5-Expresses complex 90% of the time/cues < 10% of the time  Social Interaction Social Interaction: 5-Interacts appropriately 90% of the time - Needs monitoring or encouragement for participation or interaction.  Problem Solving Problem Solving: 5-Solves complex 90% of the time/cues < 10% of the time  Memory Memory: 5-Requires cues to use assistive device Medical Problem List and Plan: 1. Functional deficits secondary to debility after multiple medical issues/chf. Right foot cellulitis 2. DVT Prophylaxis/Anticoagulation: Pharmaceutical: Coumadin 3. Pain Management: will continue flexeril and oxycodone prn for pain.  4. Anxiety/Mood: continue xanax prn. Team to provide ego support and encouragement. LCSW to follow for evaluation and support.  5. Neuropsych: This patient is capable of making decisions on his own behalf. 6. Skin/Wound Care: Routine pressure relief measures. Maintain adequate nutritional status. Offer supplements  7. Fluids/Electrolytes/Nutrition: Needs to limit water/ice intake to avoid overload and hyponatremia.   -potassium bid, increase to  8. 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 On home milrinone as 0.3100mcg/kg/min, demadex to 80 mg bid, spironolactone 25 mg bid. Not a good cadidate for advance therapies per cardiology.    -diuretic/volume mgt per cardiology (weight trending down 116kg)  -pt is concerned about location of catheter and manipulation of shirt/clothing---see above 9. VT/NSVT:  . To continue ranolazine, amiodarone bid, kdur 40mq bid (hold) and mag ox daily. 10. A fib: Heart rate controlled. On warfarin.  11. H/o CVA with left hemiparesis 12. CKD: Monitor lytes daily. Renal status at baseline 13. Leukocytosis: resolved  -no clinical signs of infection  -remains on doxycycline 14. Right ankle pain---likely gouty arthritis: Uric Acid, 9.4  -ice  -resumed low dose prednisone for ongoing joint pain--decreased prednisone to  daily--dc on monday   . LOS (Days) 10 A FACE TO FACE EVALUATION WAS PERFORMED  Arwa Yero T 04/19/2014 9:14 AM

## 2014-04-20 ENCOUNTER — Ambulatory Visit (HOSPITAL_COMMUNITY): Payer: Medicare Other | Admitting: Physical Therapy

## 2014-04-20 LAB — BASIC METABOLIC PANEL
Anion gap: 6 (ref 5–15)
BUN: 37 mg/dL — AB (ref 6–23)
CO2: 31 mmol/L (ref 19–32)
Calcium: 9.1 mg/dL (ref 8.4–10.5)
Chloride: 95 mmol/L — ABNORMAL LOW (ref 96–112)
Creatinine, Ser: 1.71 mg/dL — ABNORMAL HIGH (ref 0.50–1.35)
GFR calc Af Amer: 52 mL/min — ABNORMAL LOW (ref >90)
GFR, EST NON AFRICAN AMERICAN: 45 mL/min — AB (ref >90)
Glucose, Bld: 132 mg/dL — ABNORMAL HIGH (ref 70–99)
Potassium: 3.9 mmol/L (ref 3.5–5.1)
SODIUM: 132 mmol/L — AB (ref 135–145)

## 2014-04-20 LAB — CBC
HEMATOCRIT: 41 % (ref 39.0–52.0)
Hemoglobin: 13.7 g/dL (ref 13.0–17.0)
MCH: 30.2 pg (ref 26.0–34.0)
MCHC: 33.4 g/dL (ref 30.0–36.0)
MCV: 90.3 fL (ref 78.0–100.0)
PLATELETS: 392 10*3/uL (ref 150–400)
RBC: 4.54 MIL/uL (ref 4.22–5.81)
RDW: 15.8 % — ABNORMAL HIGH (ref 11.5–15.5)
WBC: 13.2 10*3/uL — AB (ref 4.0–10.5)

## 2014-04-20 LAB — PROTIME-INR
INR: 3.09 — ABNORMAL HIGH (ref 0.00–1.49)
Prothrombin Time: 32.1 seconds — ABNORMAL HIGH (ref 11.6–15.2)

## 2014-04-20 MED ORDER — WARFARIN SODIUM 1 MG PO TABS
1.0000 mg | ORAL_TABLET | Freq: Once | ORAL | Status: DC
  Filled 2014-04-20: qty 1

## 2014-04-20 MED ORDER — HEPARIN SOD (PORK) LOCK FLUSH 100 UNIT/ML IV SOLN
250.0000 [IU] | INTRAVENOUS | Status: AC | PRN
  Administered 2014-04-20: 250 [IU]

## 2014-04-20 NOTE — Progress Notes (Signed)
Raymond [name] is a 54 y.o. male [birth year] [id number]  Subjective: Questions about meds at home and reason for admission - anxious for DC but concerned about transition and followup plans. Frustrated with his providers  Objective: Vital signs in last 24 hours: Temp:  [97.7 F (36.5 C)-98.6 F (37 C)] 97.7 F (36.5 C) (04/02 0535) Pulse Rate:  [67-74] 70 (04/02 0535) Resp:  [18] 18 (04/02 0535) BP: (93-114)/(67-77) 109/73 mmHg (04/02 0535) SpO2:  [94 %-98 %] 94 % (04/02 0535) Weight:  [117.164 kg (258 lb 4.8 oz)] 117.164 kg (258 lb 4.8 oz) (04/02 0535) Weight change: 0.953 kg (2 lb 1.6 oz) Last BM Date: 04/16/14  Intake/Output from previous day: 04/01 0701 - 04/02 0700 In: 480 [P.O.:480] Out: 1550 [Urine:1550]  Physical Exam General: No apparent distress   Obese. Anxious Lungs: Normal effort. Lungs clear to auscultation, no crackles or wheezes. Cardiovascular: iregular rate and rhythm, trace BLE edema Musculoskeletal:  Neurovascularly intact Neurological: No new neurological deficits, L HP at baseline Wounds: skin without wound -clean, dry, intact. No signs of infection.  Lab Results: BMET    Component Value Date/Time   NA 132* 04/20/2014 0550   K 3.9 04/20/2014 0550   CL 95* 04/20/2014 0550   CO2 31 04/20/2014 0550   GLUCOSE 132* 04/20/2014 0550   BUN 37* 04/20/2014 0550   CREATININE 1.71* 04/20/2014 0550   CREATININE 1.53* 08/27/2013 1350   CALCIUM 9.1 04/20/2014 0550   GFRNONAA 45* 04/20/2014 0550   GFRNONAA 52* 08/27/2013 1350   GFRAA 52* 04/20/2014 0550   GFRAA 60 08/27/2013 1350   CBC    Component Value Date/Time   WBC 13.2* 04/20/2014 0550   WBC 7.5 04/28/2012 1200   RBC 4.54 04/20/2014 0550   RBC 4.49* 04/28/2012 1200   HGB 13.7 04/20/2014 0550   HGB 13.5* 04/28/2012 1200   HCT 41.0 04/20/2014 0550   HCT 43.6 04/28/2012 1200   PLT 392 04/20/2014 0550   MCV 90.3 04/20/2014 0550   MCV 97.0 04/28/2012 1200   MCH 30.2 04/20/2014 0550   MCH 30.1  04/28/2012 1200   MCHC 33.4 04/20/2014 0550   MCHC 31.0* 04/28/2012 1200   RDW 15.8* 04/20/2014 0550   LYMPHSABS 1.1 04/10/2014 0557   MONOABS 1.0 04/10/2014 0557   EOSABS 0.1 04/10/2014 0557   BASOSABS 0.1 04/10/2014 0557   CBG's (last 3):  No results for input(s): GLUCAP in the last 72 hours. LFT's Lab Results  Component Value Date   ALT 11 03/30/2014   AST 28 03/30/2014   ALKPHOS 109 03/30/2014   BILITOT 1.7* 03/30/2014    Studies/Results: No results found.  Medications:  I have reviewed the patient's current medications. Scheduled Medications: . acyclovir  400 mg Oral q morning - 10a  . amiodarone  200 mg Oral BID  . cyclobenzaprine  10 mg Oral BID  . levothyroxine  250 mcg Oral QAC breakfast  . magnesium oxide  400 mg Oral Daily  . potassium chloride  20 mEq Oral BID  . predniSONE  10 mg Oral Q breakfast  . ranolazine  1,000 mg Oral BID  . senna-docusate  2 tablet Oral QHS  . spironolactone  25 mg Oral BID  . torsemide  80 mg Oral BID  . Warfarin - Pharmacist Dosing Inpatient   Does not apply q1800   PRN Medications: acetaminophen, acetaminophen, ALPRAZolam, alum & mag hydroxide-simeth, bisacodyl, diphenhydrAMINE, guaiFENesin-codeine, nitroGLYCERIN, ondansetron **OR** ondansetron (ZOFRAN) IV, oxyCODONE-acetaminophen, sodium chloride, sorbitol  Assessment/Plan: Principal  Problem:   Physical debility Active Problems:   Non-ischemic cardiomyopathy   Chronic systolic heart failure   Left hemiparesis   Slow transit constipation   Gout flare  1. Functional deficits secondary to debility after multiple medical issues: chf exac and VT. S/p tx Right foot cellulitis and gouty arthritis flare - for DC home today as planned 2. DVT Prophylaxis/Anticoagulation: Pharmaceutical: Coumadin 3. Pain Management: will continue flexeril and oxycodone prn for pain.  4. Anxiety/Mood: xanax prn. Team to provide ego support and encouragement. LCSW to follow for evaluation and  support.  5. Neuropsych: This patient is capable of making decisions on his own behalf. 6. Skin/Wound Care: Routine pressure relief measures. Maintain adequate nutritional status. Offer supplements  7. Fluids/Electrolytes/Nutrition: reviewed need to limit water/ice intake to avoid overload and hyponatremia.  8. 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 On home milrinone as 0.381mcg/kg/min, demadex bid, spironolactone 25 mg bid. Not a good cadidate for advance therapies per cardiology.   -diuretic/volume mgt per cardiology (weight trending down 116kg) -pt is concerned about location of catheter and manipulation of shirt/clothing---see above 9. VT/NSVT:s/p St Jude CRT-D. At home, milnirone gtts, ranolazine, amiodarone bid. Also kdur 40mq bid and mag ox daily. 10. A fib: Heart rate controlled. On warfarin.  11. H/o CVA with left hemiparesis- stable 12. CKD:  Renal status at baseline 13. Leukocytosis: currently felt d/t pred as infection resolved -no clinical signs of infection -completed doxycycline 14. Right ankle pain---likely gouty arthritis: Uric Acid, 9.4 -ice -resumed low dose prednisone for ongoing joint pain--decreased prednisone to  daily--dc on Monday 4/4  Length of stay, days: 11  Stable for DC home and cardiology follow up as planned  Valerie A. Felicity Coyer, MD 04/20/2014, 9:43 AM

## 2014-04-20 NOTE — Progress Notes (Signed)
Physical Therapy Discharge Summary  Patient Details  Name: Raymond [name] MRN: [id number] Date of Birth: January 08, 1961  Today's Date: 04/20/2014 PT Individual Time: 1315-1400 PT Individual Time Calculation (min): 45 min    Patient has met 10 of 11 long term goals due to improved balance, ability to compensate for deficits and improved coordination.  Patient to discharge at an ambulatory level Supervision.   Patient's care partner is independent to provide the necessary physical assistance at discharge.  Reasons goals not met: Pt continues to require CGA for safe limited community ambulation.   Recommendation:  Patient will benefit from ongoing skilled PT services in home health setting to continue to advance safe functional mobility, address ongoing impairments in activity tolerance, safety with all functional mobility, stair training, and minimize fall risk.  Equipment: Patient given MD order for a scooter to address pt's limited activity tolerance.   Reasons for discharge: treatment goals met and discharge from hospital  Patient/family agrees with progress made and goals achieved: Yes  Therapeutic Interventions Therapeutic Activity: Girlfriend Darlene present for caregiver training prior to discharge. Pt in an extremely bad mood due to home IV bag being exchanged for a different style of bag that he greatly dislikes. Girlfriend and patient arguing loudly upon PT entering room. PT sets up stairs with large curb and small 4" step in front to simulate pt's steps at home. PT explains to girlfriend how to assist pt, providing SBA, and how pt will complete this technique. Pt ascends the 2 stairs with SBA with SPC without difficulty and descends the 2 stairs with quad cane with SBA without difficulty, but as pt is ambulating back to w/c (3 steps), his quad cane catches on the ground, pt stumbles, and uncontrollably sits in w/c. Patient is angry and explains that this is why he doesn't use the quad  cane at home (he holds onto a car parked in the garage). PT explains to girlfriend that she should try to assist pt into w/c if he were to fall at home and asks pt to repeat stairs trial, but he refuses.  PT instructs pt in car transfer technique using SPC, backing up to car, sitting down, using runner to get bottom further back in car, then getting legs into car, including using modification of pushing seat in posterior position and lowering backrest. Pt initially refuses to do it this was, despite practicing car transfers multiple times with PT in this technique, and explains that he stands up, and enters car sideways, involving single limb support on R leg. PT explains that his technique is unsafe and pt agrees to practice it the above technique and does so with SBA in and out of the car.  Pt is unsafe to be home without 24 hour supervision/assist, but refuses to go to SNF and no family/friends are available to assist pt during the day at home. Pt to obtain a scooter from the medical supply store, which will decrease pt's risk of falling within the home and improve access to community. No further IPR PT - pt to receive HHPT at d/c.    PT Discharge Precautions/Restrictions Precautions Precautions: Fall;ICD/Pacemaker;Other (comment) (EF = 10-15%) Precaution Comments: pacemaker placed 5 years ago; L side hemiplegia: L arm weaker than L leg Restrictions Weight Bearing Restrictions: No Vital Signs Therapy Vitals Temp: 98 F (36.7 C) Pulse Rate: 69 Resp: 18 BP: (!) 110/57 mmHg Patient Position (if appropriate): Lying Oxygen Therapy SpO2: 99 % O2 Device: Not Delivered Pain Pain Assessment  Pain Assessment: No/denies pain Vision/Perception  Perception Comments: appears wfl  Cognition Overall Cognitive Status: Within Functional Limits for tasks assessed Arousal/Alertness: Awake/alert Orientation Level: Oriented X4 Attention: Selective;Focused;Sustained Focused Attention: Appears  intact Sustained Attention: Appears intact Selective Attention: Appears intact Memory: Impaired Memory Impairment: Decreased recall of new information Decreased Short Term Memory: Verbal basic Awareness: Impaired Awareness Impairment: Emergent impairment Behaviors: Verbal agitation;Poor frustration tolerance Sensation Sensation Light Touch: Impaired Detail Light Touch Impaired Details: Impaired LUE;Impaired RLE;Impaired LLE Stereognosis: Not tested Hot/Cold: Not tested Proprioception: Appears Intact Additional Comments: diminished to LT L arm and L leg; hypersensitive R ankle Coordination Gross Motor Movements are Fluid and Coordinated: No Fine Motor Movements are Fluid and Coordinated: Not tested Coordination and Movement Description: L hemiplegia Finger Nose Finger Test: Able to complete with increased time; decreased accuracy on L side Heel Shin Test: impaired L side: decreased smooth track with fast movement Motor  Motor Motor: Abnormal tone Motor - Discharge Observations: Pt moves with L UE flexor synergy  Mobility Bed Mobility Bed Mobility: Supine to Sit;Sit to Supine Supine to Sit: 6: Modified independent (Device/Increase time) Sit to Supine: 6: Modified independent (Device/Increase time) Transfers Transfers: Yes Sit to Stand: 6: Modified independent (Device/Increase time) Stand to Sit: 6: Modified independent (Device/Increase time) Stand Pivot Transfers: 6: Modified independent (Device/Increase time) Locomotion  Ambulation Ambulation: Yes Ambulation/Gait Assistance: 5: Supervision Ambulation Distance (Feet): 30 Feet Assistive device: Straight cane Ambulation/Gait Assistance Details: Verbal cues for safe use of DME/AE Ambulation/Gait Assistance Details: unsteadiness, narrow base of support, large steps Gait Gait: Yes Gait Pattern: Impaired Gait Pattern: Step-through pattern;Narrow base of support;Lateral hip instability Stairs / Additional Locomotion Stairs:  Yes Stairs Assistance: 5: Supervision Stairs Assistance Details: Verbal cues for safe use of DME/AE Stair Management Technique: With cane (SPC up and QC down) Number of Stairs: 2 Height of Stairs: 4 Wheelchair Mobility Wheelchair Mobility: Yes Wheelchair Assistance: 3: Building surveyor Details: Manual facilitation for placement Wheelchair Propulsion: Right upper extremity;Right lower extremity Wheelchair Parts Management: Needs assistance Distance: 50  Trunk/Postural Assessment  Cervical Assessment Cervical Assessment: Within Functional Limits Thoracic Assessment Thoracic Assessment: Within Functional Limits Lumbar Assessment Lumbar Assessment: Within Functional Limits Postural Control Postural Control: Within Functional Limits  Balance Balance Balance Assessed: Yes Static Sitting Balance Static Sitting - Balance Support: Feet supported Static Sitting - Level of Assistance: 7: Independent Dynamic Sitting Balance Dynamic Sitting - Balance Support: Feet supported;Left upper extremity supported;Right upper extremity supported Dynamic Sitting - Level of Assistance: 6: Modified independent (Device/Increase time) Static Standing Balance Static Standing - Balance Support: During functional activity;Right upper extremity supported Static Standing - Level of Assistance: 6: Modified independent (Device/Increase time) Dynamic Standing Balance Dynamic Standing - Balance Support: Right upper extremity supported;During functional activity Dynamic Standing - Level of Assistance: 6: Modified independent (Device/Increase time) Extremity Assessment  RUE Assessment RUE Assessment: Within Functional Limits LUE Assessment LUE Assessment: Exceptions to WFL LUE AROM (degrees) Overall AROM Left Upper Extremity: Deficits;Due to premorbid status LUE Overall AROM Comments: Pt able to achieve full ROM, however, is unable to maintain position against gravity due to L hemiplegia.  LUE  PROM (degrees) Overall PROM Left Upper Extremity: Within functional limits for tasks assessed LUE Strength LUE Overall Strength: Deficits;Due to premorbid status LUE Overall Strength Comments: grossly 4+/5 at shoulder; 3+/5 at elbow; all digits unable to be fully extended.  LUE Tone LUE Tone: Modified Ashworth Modified Ashworth Scale for Grading Hypertonia LUE: Slight increase in muscle tone, manifested by a catch and  release or by minimal resistance at the end of the range of motion when the affected part(s) is moved in flexion or extension RLE Assessment RLE Assessment: Within Functional Limits LLE Assessment LLE Assessment: Exceptions to WFL LLE AROM (degrees) Overall AROM Left Lower Extremity: Within functional limits for tasks assessed LLE PROM (degrees) Overall PROM Left Lower Extremity: Within functional limits for tasks assessed LLE Strength LLE Overall Strength: Deficits;Due to premorbid status LLE Overall Strength Comments: grossly 4+/5 LLE Tone LLE Tone: Modified Ashworth Modified Ashworth Scale for Grading Hypertonia LLE: Slight increase in muscle tone, manifested by a catch, followed by minimal resistance throughout the remainder (less than half) of the ROM  See FIM for current functional status  Kijuana Ruppel M 04/20/2014, 3:45 PM

## 2014-04-20 NOTE — Progress Notes (Signed)
ANTICOAGULATION CONSULT NOTE - Follow Up Consult  Pharmacy Consult for coumadin Indication: afib and CVA  Allergies  Allergen Reactions  . Valsartan Cough    Patient Measurements: Height: 5\' 9"  (175.3 cm) Weight: 258 lb 4.8 oz (117.164 kg) IBW/kg (Calculated) : 70.7  Vital Signs: Temp: 97.7 F (36.5 C) (04/02 0535) Temp Source: Oral (04/02 0535) BP: 109/73 mmHg (04/02 0535) Pulse Rate: 70 (04/02 0535)  Labs:  Recent Labs  04/18/14 0555 04/19/14 0515 04/20/14 0550  HGB  --   --  13.7  HCT  --   --  41.0  PLT  --   --  392  LABPROT 26.2* 29.6* 32.1*  INR 2.39* 2.79* 3.09*  CREATININE 1.93* 2.02* 1.71*    Estimated Creatinine Clearance: 64.6 mL/min (by C-G formula based on Cr of 1.71).   Assessment: 54 yo male on coumadin for afib and CVA.  INR now trending back up to 3.09.  CBC stable, no bleeding noted.   Goal of Therapy:  INR 2-3 Monitor platelets by anticoagulation protocol: Yes   Plan:  - Coumadin 1mg  po tonight - Daily PT / INR  Christoper Fabian, PharmD, BCPS Clinical pharmacist, pager [phone] 04/20/2014,11:11 AM

## 2014-04-20 NOTE — Plan of Care (Signed)
Problem: RH Ambulation Goal: LTG Patient will ambulate in community environment (PT) LTG: Patient will ambulate in community environment, # of feet with assistance (PT).  Outcome: Not Met (add Reason) Pt continues to req CGA in community setting for safety.

## 2014-04-20 NOTE — Progress Notes (Signed)
Patient discharged home accompanied by fiancee and staff. Patient's stable. No signs and symptoms of respiratory distress noted. Denies any pain or discomfort. Skin warm and dry to touch. PICC line to right upper extremities with  Milrinone infusing via [facility]. Discharge instructions and prescription given. Patient and fiancee verbalized understanding.

## 2014-04-22 ENCOUNTER — Telehealth: Payer: Self-pay

## 2014-04-22 ENCOUNTER — Other Ambulatory Visit: Payer: Self-pay

## 2014-04-22 NOTE — Patient Outreach (Signed)
Spoke with patient for transition of care. Patient has not weighed today . Encouraged daily weights and discussed with patient the reasons for calling MD.  Patient denies any falls since hospital discharge.    Patient reports that [facility] social worker will see him tomorrow.

## 2014-04-22 NOTE — Patient Outreach (Signed)
CSW was called by patient. Patient reported that he had been released Friday from cardiac rehab. CSW made plans to follow-up with patient to assist with nutritional supplements and financial assistance as planned before patient was admitted to the hospital. CSW will meet with patient tomorrow. Patient unable to continue conversation due to needing to go to the bathroom.

## 2014-04-23 ENCOUNTER — Other Ambulatory Visit: Payer: Self-pay

## 2014-04-23 ENCOUNTER — Telehealth (HOSPITAL_COMMUNITY): Payer: Self-pay | Admitting: Cardiology

## 2014-04-23 DIAGNOSIS — M109 Gout, unspecified: Secondary | ICD-10-CM

## 2014-04-23 HISTORY — DX: Gout, unspecified: M10.9

## 2014-04-23 NOTE — Patient Outreach (Signed)
[facility]) Care Management  [facility] Social Work  04/23/2014  Raymond [name] Nov 16, 1960 [id number]  Subjective:    Objective:   Current Medications:  Current Outpatient Prescriptions  Medication Sig Dispense Refill  . acyclovir (ZOVIRAX) 800 MG tablet Take 400 mg by mouth daily.     Marland Kitchen ALPRAZolam (XANAX) 0.25 MG tablet Take 1 tablet (0.25 mg total) by mouth at bedtime as needed for anxiety. 30 tablet 2  . amiodarone (PACERONE) 200 MG tablet Take 1 tablet (200 mg total) by mouth 2 (two) times daily. 60 tablet 6  . cyclobenzaprine (FLEXERIL) 10 MG tablet Take 1 tablet (10 mg total) by mouth 2 (two) times daily. 20 tablet 0  . DM-Doxylamine-Acetaminophen (NYQUIL COLD & FLU PO) Take 30 mLs by mouth daily as needed (for cold).    Marland Kitchen levothyroxine (SYNTHROID, LEVOTHROID) 125 MCG tablet Take 2 tablets (250 mcg total) by mouth daily before breakfast. Additional refill should come from PCP 60 tablet 3  . magnesium oxide (MAG-OX) 400 (241.3 MG) MG tablet Take 1 tablet (400 mg total) by mouth daily. 30 tablet 6  . Menthol (RICOLA) LOZG Use as directed 1 lozenge in the mouth or throat daily as needed (for cough).    . milrinone (PRIMACOR) 20 MG/100ML SOLN infusion Inject 45.225 mcg/min into the vein continuous. 100 mL 11  . Potassium Chloride ER 20 MEQ TBCR Take 20 mEq by mouth 2 (two) times daily. 60 tablet 0  . predniSONE (DELTASONE) 10 MG tablet Take 1 tablet (10 mg total) by mouth daily with breakfast. 2 tablet 0  . ranolazine (RANEXA) 1000 MG SR tablet Take 1 tablet (1,000 mg total) by mouth 2 (two) times daily. 60 tablet 6  . senna-docusate (SENOKOT-S) 8.6-50 MG per tablet Take 2 tablets by mouth at bedtime. 60 tablet 1  . spironolactone (ALDACTONE) 25 MG tablet Take 1 tablet (25 mg total) by mouth 2 (two) times daily. 60 tablet 0  . torsemide (DEMADEX) 20 MG tablet Take 4 tablets (80 mg total) by mouth 2 (two) times daily. 240 tablet 3  . warfarin (COUMADIN) 5 MG tablet Take 1  tablet (5 mg total) by mouth daily at 6 PM. 30 tablet 0   No current facility-administered medications for this visit.    Functional Status:  In your present state of health, do you have any difficulty performing the following activities: 04/23/2014 03/30/2014  Is the patient deaf or have difficulty hearing? N N  Hearing N N  Vision N N  Difficulty concentrating or making decisions Y N  Walking or climbing stairs? Y N  Doing errands, shopping? Y N  Preparing Food and eating ? Y -  Using the Toilet? N -  In the past six months, have you accidently leaked urine? N -  Do you have problems with loss of bowel control? N -  Managing your Medications? N -  Managing your Finances? Y -  Housekeeping or managing your Housekeeping? Y -    Fall/Depression Screening:  PHQ 2/9 Scores 04/23/2014 08/27/2013  PHQ - 2 Score 1 1    Assessment: CSW met with patient at home. CSW presented patient with gift card from giving committee as requested to help with getting groceries. CSW also presented patient with case of ensure patient has previously been approved for prior to hospital stay. CSW reminded patient regarding his fluid restrictions and presented patient with copy of chronic heart failure management information packet. CSW confirmed patient requested follow-up from nursing  and pharmacy. Patient reported his medications had gone up in price and that he had been denied for low-income subsidy and full Medicaid. CSW discussed applying for partial medicaid and food stamps to help with getting back money for medications. Patient assisted CSW in completing applications for medicaid and food stamps. Patient reported that he would add additional information once his girlfriend met with him over the weekend. CSW discussed resources such as our place which patient determined he was unable to afford. Patient reported that he was in less pain and that his girlfriend was helping him maintain his home. CSW made plans to  follow-up with patient to make sure that both applications were submitted to social services.   Plan: CSW will refer pharmacy to help with examining if patient can reduce his medication costs. CSW will continue to work with patient on completing and getting approved for financial resources.

## 2014-04-23 NOTE — Telephone Encounter (Signed)
Diannia Ruder called after [facility] evaluation for OT Pt would like to hold off on OT services at this time Pt reports other service are being provided at this time and would like fully complete these services before beginning a new "skill set" OT will re-evaluate at a later time

## 2014-04-23 NOTE — Discharge Summary (Signed)
Physician Discharge Summary  Patient ID: Raymond [name] MRN: [id number] DOB/AGE: December 22, 1960 54 y.o.  Admit date: 04/09/2014 Discharge date: 04/20/2014  Discharge Diagnoses:  Principal Problem:   Physical debility Active Problems:   Non-ischemic cardiomyopathy   Chronic systolic heart failure   Left hemiparesis   Slow transit constipation   Gout flare   Discharged Condition: Stable   Labs:  Basic Metabolic Panel:  Recent Labs Lab 04/17/14 0653 04/18/14 0555 04/19/14 0515 04/20/14 0550  NA 133* 133* 134* 132*  K 3.7 3.3* 3.3* 3.9  CL 93* 91* 91* 95*  CO2 30 30 37* 31  GLUCOSE 123* 136* 131* 132*  BUN 32* 33* 39* 37*  CREATININE 1.75* 1.93* 2.02* 1.71*  CALCIUM 9.2 8.8 9.0 9.1  MG  --   --  1.9  --     CBC:  Recent Labs Lab 04/17/14 0653 04/20/14 0550  WBC 11.3* 13.2*  HGB 13.2 13.7  HCT 36.2* 41.0  MCV 90.7 90.3  PLT 385 392    CBG: No results for input(s): GLUCAP in the last 168 hours.  Brief HPI:   [name] is a 54 y.o. 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 CXR with evidence of fluid overload, K+ at low normal levels and patient reported weight up by 4 lbs. Past admission, he was shocked for VT on the evening of 3/12 and again on the evening of 3/13. He was started on IV amiodarone and magnesium/potassium supplemented.  He was evaluated by Dr Graciela Husbands and started on ranolazine and has been transitioned back to po amiodarone. He was diuresed with milrinone and lasix for acute on chronic systolic CHF with good results. Co-ox levels elevated today and recheck pending. He has had foot pain due to cellulitis and was started on doxycyline for treatment. Therapy initiated and patient noted to be significantly deconditioned and  has been limited by right foot pain as well as left hemiparesis due to prior CVA. Rehab team /MD recommended CIR for follow up therapy and patient admitted today  Hospital Course: [name] was admitted to rehab 04/09/2014 for inpatient therapies to consist of PT, ST and OT at least three hours five days a week. Past admission physiatrist, therapy team and rehab RN have worked together to provide customized collaborative inpatient rehab.   He continued to have complaints of right ankle pain and uric acid levels were elevated at 9.2. He was treated with slow steroid taper with improvement in pain management/symptoms. Cellulitis has resolved and completed his antibiotic course on 04/02. Blood pressures have been reasonably controlled. He had a brief break in his milrinone dosing on 03/26 with complaints of dizziness and cardiology was consulted for input.  Metolazone was added to help with signs of overload with good diuresis and improvement in symptoms. Due to increase in creatinine to 2.02, torsemide was held X 24 hours and patient to continue 80 mg bid at discharge. He is to continue on home milrinone at 0.375 mcg in addition to amiodarone 200 mg bid, ranolazine 1000 mg bid and spironolactone 25 mg bid.   During patient's stay in rehab weekly team conferences were held to monitor patient's progress, set goals and discuss barriers to discharge. Patient has had improvement in activity  tolerance, balance, postural control, as well as ability to compensate for deficits. He is ambulating at supervision level. He requires SBA for stair navigation and min/CGA when in community setting. Concerns about safety at home due to impulsivity as well as poor safety awareness were discussed with patient.  Family education was done with girlfriend and. 24 hours supervision was recommended past discharge.  Patient has declined SNF or ALF as per recommendations.  He will continue to receive follow up HHPT, HHOT, HHRN and  HHSW by [facility] past discharge.  He was discharged to home on 04/20/14     Disposition: 01-Home or Self Care  Diet:  Cardiac diet. Low salt.   Special Instructions: 1. Check weights daily.     Medication List    STOP taking these medications        doxycycline 100 MG tablet  Commonly known as:  VIBRA-TABS     guaiFENesin-codeine 100-10 MG/5ML syrup     guaiFENesin-dextromethorphan 100-10 MG/5ML syrup  Commonly known as:  ROBITUSSIN DM     HYDROcodone-acetaminophen 5-325 MG per tablet  Commonly known as:  NORCO/VICODIN      TAKE these medications        acyclovir 800 MG tablet  Commonly known as:  ZOVIRAX  Take 400 mg by mouth daily.     ALPRAZolam 0.25 MG tablet  Commonly known as:  XANAX  Take 1 tablet (0.25 mg total) by mouth at bedtime as needed for anxiety.     amiodarone 200 MG tablet  Commonly known as:  PACERONE  Take 1 tablet (200 mg total) by mouth 2 (two) times daily.     cyclobenzaprine 10 MG tablet  Commonly known as:  FLEXERIL  Take 1 tablet (10 mg total) by mouth 2 (two) times daily.     levothyroxine 125 MCG tablet  Commonly known as:  SYNTHROID, LEVOTHROID  Take 2 tablets (250 mcg total) by mouth daily before breakfast. Additional refill should come from PCP     magnesium oxide 400 (241.3 MG) MG tablet  Commonly known as:  MAG-OX  Take 1 tablet (400 mg total) by mouth daily.     milrinone 20 MG/100ML Soln infusion  Commonly known as:  PRIMACOR  Inject 45.225 mcg/min into the vein continuous.     NYQUIL COLD & FLU PO  Take 30 mLs by mouth daily as needed (for cold).     Potassium Chloride ER 20 MEQ Tbcr  Take 20 mEq by mouth 2 (two) times daily.     predniSONE 10 MG tablet  Commonly known as:  DELTASONE  Take 1 tablet (10 mg total) by mouth daily with breakfast.     ranolazine 1000 MG SR tablet  Commonly known as:  RANEXA  Take 1 tablet (1,000 mg total) by mouth 2 (two) times daily.     RICOLA Lozg  Use as directed 1  lozenge in the mouth or throat daily as needed (for cough).     senna-docusate 8.6-50 MG per tablet  Commonly known as:  Senokot-S  Take 2 tablets by mouth at bedtime.     spironolactone 25 MG tablet  Commonly known as:  ALDACTONE  Take 1 tablet (25 mg total) by mouth 2 (two) times daily.     torsemide 20 MG tablet  Commonly known as:  DEMADEX  Take 4 tablets (80 mg total) by mouth 2 (two) times daily.     warfarin 5 MG tablet  Commonly known as:  COUMADIN  Take 1 tablet (5 mg total) by mouth daily at 6 PM.           Follow-up Information    Follow up with Nilda Simmer, MD On 05/08/2014.   Specialty:  Family Medicine   Why:  @ 11:00 am   Contact information:   [address] [place] [address] [phone]       Follow up with [name], MD.   Specialty:  Physical Medicine and Rehabilitation   Why:  As needed   Contact information:   [address], [address] [place] [address] [phone]       Follow up with Arvilla Meres, MD. Call in 2 days.   Specialty:  Cardiology   Why:  for follow up appointment   Contact information:   [address] [place] [address] [phone]       Signed: Jacquelynn Cree 04/23/2014, 10:00 AM

## 2014-04-24 ENCOUNTER — Telehealth: Payer: Self-pay | Admitting: *Deleted

## 2014-04-24 NOTE — Telephone Encounter (Signed)
Patient is overdue for INR monitoring.  I called [facility] to check and see if the INR was done and to date it has not been done.  I was transferred to [facility]  phone and there was no answer, the secretary returned to the line and stated all the nurses were in a meeting and there was no one to take a verbal order.  I instructed her that we needed the INR results soon therefore I faxed over an order today to obtain an INR on the patient tomorrow 04/25/14 and the fax was addressed to Kathlen Mody- [facility] nurse manager.  Fax was sent to [phone] and it was confirmed as being received.   Hemphill, Sherrilee Gilles, RN

## 2014-04-24 NOTE — Progress Notes (Addendum)
Social Work  Discharge Note  The overall goal for the admission was met for:   Discharge location: Yes - pt returned to his home  Length of Stay: Yes - 11 days  Discharge activity level: Yes - supervision  Home/community participation: Yes  Services provided included: MD, RD, PT, OT, RN, TR, Pharmacy and SW  Financial Services: Medicare  Follow-up services arranged: Home Health: RN, PT, OT, SW restarted via [facility], DME: provided pt with instruction and prescription for power scooter to take to retail DME store and Patient/Family has no preference for HH/DME agencies  Comments (or additional information): Pt can only commit to having intermittent assistance at home.  He is aware that tx team recommends 24/7 supervision.  Offering option of privately arranging this vs SNF/ALF placement - pt refuses these options   Patient/Family verbalized understanding of follow-up arrangements: Yes  Individual responsible for coordination of the follow-up plan: pt  Confirmed correct DME delivered: NA    ,  

## 2014-04-25 ENCOUNTER — Ambulatory Visit (INDEPENDENT_AMBULATORY_CARE_PROVIDER_SITE_OTHER): Payer: Medicare Other | Admitting: Cardiovascular Disease

## 2014-04-25 DIAGNOSIS — Z5181 Encounter for therapeutic drug level monitoring: Secondary | ICD-10-CM

## 2014-04-25 LAB — POCT INR: INR: 4.4

## 2014-04-26 ENCOUNTER — Telehealth (HOSPITAL_COMMUNITY): Payer: Self-pay | Admitting: *Deleted

## 2014-04-26 ENCOUNTER — Telehealth: Payer: Self-pay | Admitting: Internal Medicine

## 2014-04-26 NOTE — Telephone Encounter (Signed)
ERROR

## 2014-04-26 NOTE — Telephone Encounter (Signed)
Pt stated his weight is down 15 lbs from yesterday. Weight yesterday 273lbs he is now 258lbs pt claims to have no symptoms at this time stated he felt "fine".  Advised pt that we wouldn't make any changes today and to check his weight again tomorrow (per heather)

## 2014-04-29 ENCOUNTER — Other Ambulatory Visit: Payer: Self-pay

## 2014-04-29 ENCOUNTER — Other Ambulatory Visit: Payer: Self-pay | Admitting: Pharmacist

## 2014-04-29 ENCOUNTER — Telehealth: Payer: Self-pay | Admitting: Licensed Clinical Social Worker

## 2014-04-29 NOTE — Patient Outreach (Signed)
Patient reports that he is weighing. States that he lost 14 pounds overnight last week and notified his MD. Reports that he has not weighed yet. Requested that patient weigh while on the phone. Patient did and verbalized weight of 260.  Reports that he is doing well with the exception of cough which he has had for 5 years.  Reports that home health nurse is coming to see him today.   Reports that his girlfriend continues to fill his med boxes weekly. Reports that he will see his MD this Friday. States that he has transportation to this appointment. Reports that he is following his low salt diet.   Assessment: Denies any problems today.   Wt 260 lb (117.935 kg)   PLAN:  Will continue to follow up weekly for transition of care calls.  [place]        Patient Outreach Telephone from 04/29/2014 in [place] CM Short Term Goal #2 Met Date  04/29/14 [reports that home health nurse came last week and is coming back today.]   [facility] CM Short Term Goal #3 (0-30 days)  Patient will be able to report weighing daily for the next 21 days.    [facility] CM Short Term Goal #3 Start Date  04/29/14     Tomasa Rand, RN, BSN, CEN [place] Coordinator [phone]

## 2014-04-29 NOTE — Patient Outreach (Addendum)
[facility]) Care Management  [facility] CM Pharmacy   04/29/2014  Shaunn Tackitt [birth year] [id number]  Subjective: Raymond [name] is a 54 y.o. male who was referred to [facility] for a medication review consult and to determine if any of the patient's medications could be found at a cheaper cost or if there were cheaper alternatives. This patient is known to pharmacy for medication review earlier in 2016 (documented in Consulting civil engineer).  I spoke to the patient and he reported that he did not have Medicare Part D coverage, only Medicare Part A and B. He reported that he could not afford a Part D plan when he looked into it in the past. He reports that his medications cost him about $100 a month out of pocket. He gets his medications filled at [facility] to save money. However, I checked the Medicare coverage website and he has [employer] Part D, which I believe is being used at [employer] based on the cost of his medications - I would expect they would be a least hundreds of dollars without insurance.  He reported that he was cutting his pills in half to save money. He denied doing this with his warfarin.   Objective:   Current Medications: Current Outpatient Prescriptions  Medication Sig Dispense Refill  . ALPRAZolam (XANAX) 0.25 MG tablet Take 1 tablet (0.25 mg total) by mouth at bedtime as needed for anxiety. 30 tablet 2  . amiodarone (PACERONE) 200 MG tablet Take 1 tablet (200 mg total) by mouth 2 (two) times daily. 60 tablet 6  . cyclobenzaprine (FLEXERIL) 10 MG tablet Take 1 tablet (10 mg total) by mouth 2 (two) times daily. 20 tablet 0  . levothyroxine (SYNTHROID, LEVOTHROID) 125 MCG tablet Take 2 tablets (250 mcg total) by mouth daily before breakfast. Additional refill should come from PCP 60 tablet 3  . magnesium oxide (MAG-OX) 400 (241.3 MG) MG tablet Take 1 tablet (400 mg total) by mouth daily. 30 tablet 6  . milrinone (PRIMACOR) 20 MG/100ML SOLN infusion Inject  45.225 mcg/min into the vein continuous. 100 mL 11  . Potassium Chloride ER 20 MEQ TBCR Take 20 mEq by mouth 2 (two) times daily. 60 tablet 0  . predniSONE (DELTASONE) 10 MG tablet Take 1 tablet (10 mg total) by mouth daily with breakfast. 2 tablet 0  . ranolazine (RANEXA) 1000 MG SR tablet Take 1 tablet (1,000 mg total) by mouth 2 (two) times daily. 60 tablet 6  . senna-docusate (SENOKOT-S) 8.6-50 MG per tablet Take 2 tablets by mouth at bedtime. 60 tablet 1  . spironolactone (ALDACTONE) 25 MG tablet Take 1 tablet (25 mg total) by mouth 2 (two) times daily. 60 tablet 0  . torsemide (DEMADEX) 20 MG tablet Take 4 tablets (80 mg total) by mouth 2 (two) times daily. 240 tablet 3  . warfarin (COUMADIN) 5 MG tablet Take 1 tablet (5 mg total) by mouth daily at 6 PM. 30 tablet 0  . acyclovir (ZOVIRAX) 800 MG tablet Take 400 mg by mouth daily.     Marland Kitchen DM-Doxylamine-Acetaminophen (NYQUIL COLD & FLU PO) Take 30 mLs by mouth daily as needed (for cold).    . Menthol (RICOLA) LOZG Use as directed 1 lozenge in the mouth or throat daily as needed (for cough).     No current facility-administered medications for this visit.    Functional Status: In your present state of health, do you have any difficulty performing the following activities: 04/23/2014 03/30/2014  Hearing? N N  Vision? N N  Difficulty concentrating or making decisions? N N  Walking or climbing stairs? Y N  Dressing or bathing? Y N  Doing errands, shopping? Y N  Preparing Food and eating ? Y -  Using the Toilet? N -  In the past six months, have you accidently leaked urine? N -  Do you have problems with loss of bowel control? N -  Managing your Medications? N -  Managing your Finances? Y -  Housekeeping or managing your Housekeeping? Y -    Fall/Depression Screening: PHQ 2/9 Scores 04/23/2014 08/27/2013  PHQ - 2 Score 1 1    Assessment: 1. Medication review consult: all of the patient's medications are appropriate based on the severity  of his conditions. I did not identify any medication gaps, duplications in therapy, or other pharmacy issues. 2. Medication cost: patient is on specific medications for his heart failure and unfortunately these cannot be changed. He does not have a Part D plan.  Patient has been denied low income subsidy and Medicaid. He is currently in the process of applying for partial Medicaid with the help of the [facility] social worker.  Plan: 1. Medication review consult: no recommendations for changes to medications 2. Medication cost issues: no recommendations to changes for medications to reduce cost. Recommended that patient not cut his pills in half. Unfortunately the medications that he is on are necessary and there are no cheaper alternatives. He is using [facility] which likely provides some of the best prices for the medications that he is on and he has [employer] Part D. I am unable to assist with picking a Part D plan but tried to provided resources [facility]). However, patient did not want to talk any more and needed to go. Told patient to call me with any further questions but highly recommended that if he is unhappy with his drug coverage to discuss with a Medicare Part D representative in the future. Patient could also consider using mail order pharmacy to save some money. He just thanked me for the information and didn't want to talk about it any further. Will close out of pharmacy program but will be available for further assistance.    Juanita Craver, PharmD, BCPS [facility] Resident [phone]

## 2014-04-29 NOTE — Telephone Encounter (Signed)
CSW contacted patient by phone to offer support. Patient states he has appointment in the [facility] Clinic this Friday. Patient reported that he gets bored and down due to "inability to work on cars or do much of anything because I get out of breathe". Patient states that his mother returned to work and he spends most of his day alone watching TV. CSW offered support and will be available as needed. Patient appears to have support system with family and verbalizes understanding of contacts for support. CSW will continue to be available as needed. Lasandra Beech, LCSW [phone]

## 2014-05-01 ENCOUNTER — Ambulatory Visit: Payer: Medicare Other | Admitting: *Deleted

## 2014-05-01 DIAGNOSIS — I5022 Chronic systolic (congestive) heart failure: Secondary | ICD-10-CM

## 2014-05-01 DIAGNOSIS — I428 Other cardiomyopathies: Secondary | ICD-10-CM

## 2014-05-02 ENCOUNTER — Ambulatory Visit (INDEPENDENT_AMBULATORY_CARE_PROVIDER_SITE_OTHER): Payer: Medicare Other | Admitting: Cardiovascular Disease

## 2014-05-02 DIAGNOSIS — Z5181 Encounter for therapeutic drug level monitoring: Secondary | ICD-10-CM

## 2014-05-02 DIAGNOSIS — I4891 Unspecified atrial fibrillation: Secondary | ICD-10-CM

## 2014-05-02 LAB — POCT INR: INR: 2.7

## 2014-05-02 NOTE — Progress Notes (Signed)
Remote ICD transmission.   

## 2014-05-03 ENCOUNTER — Encounter: Payer: Self-pay | Admitting: Internal Medicine

## 2014-05-03 ENCOUNTER — Other Ambulatory Visit (INDEPENDENT_AMBULATORY_CARE_PROVIDER_SITE_OTHER): Payer: Medicare Other | Admitting: *Deleted

## 2014-05-03 ENCOUNTER — Ambulatory Visit (HOSPITAL_COMMUNITY)
Admission: RE | Admit: 2014-05-03 | Discharge: 2014-05-03 | Disposition: A | Discharge status: Home / Self Care | Payer: Medicare Other | Source: Ambulatory Visit | Attending: Cardiology | Admitting: Cardiology

## 2014-05-03 VITALS — BP 118/80 | HR 83 | Wt 274.0 lb

## 2014-05-03 DIAGNOSIS — I5022 Chronic systolic (congestive) heart failure: Secondary | ICD-10-CM

## 2014-05-03 DIAGNOSIS — I4891 Unspecified atrial fibrillation: Secondary | ICD-10-CM

## 2014-05-03 DIAGNOSIS — I472 Ventricular tachycardia, unspecified: Secondary | ICD-10-CM

## 2014-05-03 DIAGNOSIS — N183 Chronic kidney disease, stage 3 unspecified: Secondary | ICD-10-CM

## 2014-05-03 LAB — BASIC METABOLIC PANEL
BUN: 14 mg/dL (ref 6–23)
CHLORIDE: 100 meq/L (ref 96–112)
CO2: 26 meq/L (ref 19–32)
CREATININE: 1.27 mg/dL (ref 0.40–1.50)
Calcium: 9.2 mg/dL (ref 8.4–10.5)
GFR: 63.4 mL/min (ref >60.00)
GLUCOSE: 127 mg/dL — AB (ref 70–99)
Potassium: 3.6 mEq/L (ref 3.5–5.1)
Sodium: 134 mEq/L — ABNORMAL LOW (ref 135–145)

## 2014-05-03 LAB — CBC
HCT: 27.4 % — ABNORMAL LOW (ref 39.0–52.0)
Hemoglobin: 9.2 g/dL — ABNORMAL LOW (ref 13.0–17.0)
MCHC: 33.4 g/dL (ref 30.0–36.0)
MCV: 92.2 fl (ref 78.0–100.0)
PLATELETS: 197 10*3/uL (ref 150.0–400.0)
RBC: 2.97 Mil/uL — AB (ref 4.22–5.81)
RDW: 18 % — ABNORMAL HIGH (ref 11.5–15.5)
WBC: 5.9 10*3/uL (ref 4.0–10.5)

## 2014-05-03 LAB — BRAIN NATRIURETIC PEPTIDE: PRO B NATRI PEPTIDE: 629 pg/mL — AB (ref 0.0–100.0)

## 2014-05-03 MED ORDER — TORSEMIDE 20 MG PO TABS: 100.0000 mg | ORAL_TABLET | Freq: Two times a day (BID) | ORAL | Status: AC

## 2014-05-03 MED ORDER — POTASSIUM CHLORIDE ER 20 MEQ PO TBCR: 40.0000 meq | EXTENDED_RELEASE_TABLET | Freq: Two times a day (BID) | ORAL | Status: AC

## 2014-05-03 NOTE — Progress Notes (Signed)
Patient ID: Raymond [name], male   DOB: 07/30/1960, 54 y.o.   MRN: [id number] PCP: [facility] Dr Nilda Simmer  Primary EP: Dr. 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   LHC in 2008 with normal cors. Myoview 05/2010: Very mild distal anterior and apical ischemia, EF 17% (low risk-medical therapy continued). Previously on Amiodarone and had very high TSH and was referred to endocrinology. He has had several admissions over the last 6 mos with recurrent atrial fibrillation with RVR and volume overload. He has undergone TEE-DCCV but had return of AFib. He was last admitted 12/21-12/24 with a/c systolic CHF in the setting of AFib with RVR and loss of BiV pacing. AV nodal ablation was recommended for control of refractory atrial fibrillation. He underwent this procedure with Dr. [name] 01/09/13. Amiodarone was discontinued at that time.   Admitted to [facility] with cardiogenic shock in 1/16. Required dual pressors and discharged on milrinone at 0.375 mcg. He was diuresed with IV lasix and transitioned to torsemide 60 mg daily. Discharge weight was 275 pounds.  In 3/16, amiodarone was cut back and torsemide was increased.  Subsequently, he had multiple episodes of VT with ICD discharges in the setting of hypokalemia.  Amiodarone was increased to 200 mg bid and ranolazine was added.  He was volume overloaded and diuresed with IV Lasix.  He was sent to inpatient rehab and recently came home from there. He continues to live alone, mother comes to appointment today with him.     Follow up for Heart Failure: Generalized fatigue.  Getting home PT twice a week.  Feels weak, walking with walker or cane.  Short of breath walking around the house.  Weight up 3 lbs compared to prior appointment.  No chest pain. No lightheadedness.   Corevue interrogated, impedance  low suggesting fluid overload.   ECHO 05/02/13 EF 10%  ECHO 1/16: EF 10-15%, diffuse hypokinesis, RV moderately dilated and mildly decreased function, PA systolic pressure 64 mmHg.   Labs 04/02/13 K 4.1 Creatinine 1.52 Pro BNP 2869 Labs 06/06/13 K 3.5, creatinine 1.51 Labs 08/27/13: K 3.8, creatinine 1.53, TSH 2.493, Free T4 1.33 Labs 02/22/2014: K 4.1 Creatinine 1.79  Labs 3/16: TSH normal, AST/ALT normal Labs 4/16: K 3.9, creatinine 1.7, HCT 41  SH: Lives alone.  Mom drives him to appointments. Disabled.since 2010 . 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  Past Medical History  Diagnosis Date  . Non-ischemic cardiomyopathy     a. echo 11/10: EF 15%;   b. cath 10/08: normal cors;  c. Myoview 5/12: Very mild distal ant and apical isch, EF 17% (low risk-medical Rx cont'd);  d.  Echo 4/14:  EF 15%;  e. 05/2009 s/p SJM BiV ICD;  f. 07/2012 TEE: EF 20-25%, diff HK mild MR, mod TR.   Marland Kitchen Atrial fibrillation     a. on coumadin;  b. 07/2012 s/p TEE/DCCV. c. Recurrence 08/2012 in setting of abnormal thyroid panel.; 12/2012 AVN ablation by Dr [name]  . Hypertension   . Obesity   . Non-compliance   . Dyslexia     unable to read.  Marland Kitchen History of CVA (cerebrovascular accident) 11/2008    a. right  brain CVA 11/10 tx with tPA-> PTA and stenting  . RBBB (right bundle branch block)   . Cough syncope     a. During 08/2012 adm.  . Systolic CHF, chronic 01/19/1996  . Hypothyroidism 01/19/1996    s/p RAI therapy  . Stroke 01/19/2008  . Renal insufficiency   . Gout 04/23/2014    identified in hospital stay    Current Outpatient Prescriptions  Medication Sig Dispense Refill  . acyclovir (ZOVIRAX) 800 MG tablet Take 400 mg by mouth daily.     Marland Kitchen ALPRAZolam (XANAX) 0.25 MG tablet Take 1 tablet (0.25 mg total) by mouth at bedtime as needed for anxiety. 30 tablet 2  . amiodarone (PACERONE) 200 MG tablet Take 1 tablet (200 mg  total) by mouth 2 (two) times daily. 60 tablet 6  . cyclobenzaprine (FLEXERIL) 10 MG tablet Take 1 tablet (10 mg total) by mouth 2 (two) times daily. 20 tablet 0  . DM-Doxylamine-Acetaminophen (NYQUIL COLD & FLU PO) Take 30 mLs by mouth daily as needed (for cold).    Marland Kitchen levothyroxine (SYNTHROID, LEVOTHROID) 125 MCG tablet Take 2 tablets (250 mcg total) by mouth daily before breakfast. Additional refill should come from PCP 60 tablet 3  . magnesium oxide (MAG-OX) 400 (241.3 MG) MG tablet Take 1 tablet (400 mg total) by mouth daily. 30 tablet 6  . Menthol (RICOLA) LOZG Use as directed 1 lozenge in the mouth or throat daily as needed (for cough).    . milrinone (PRIMACOR) 20 MG/100ML SOLN infusion Inject 45.225 mcg/min into the vein continuous. 100 mL 11  . Potassium Chloride ER 20 MEQ TBCR Take 40 mEq by mouth 2 (two) times daily. 60 tablet 0  . predniSONE (DELTASONE) 10 MG tablet Take 1 tablet (10 mg total) by mouth daily with breakfast. 2 tablet 0  . ranolazine (RANEXA) 1000 MG SR tablet Take 1 tablet (1,000 mg total) by mouth 2 (two) times daily. 60 tablet 6  . senna-docusate (SENOKOT-S) 8.6-50 MG per tablet Take 2 tablets by mouth at bedtime. 60 tablet 1  . spironolactone (ALDACTONE) 25 MG tablet Take 1 tablet (25 mg total) by mouth 2 (two) times daily. 60 tablet 0  . torsemide (DEMADEX) 20 MG tablet Take 5 tablets (100 mg total) by mouth 2 (two) times daily. 240 tablet 3  . warfarin (COUMADIN) 5 MG tablet Take 1 tablet (5 mg total) by mouth daily at 6 PM. 30 tablet 0   No current facility-administered medications for this encounter.    Filed Vitals:   05/03/14 0922  BP: 118/80  Pulse: 83  Weight: 274 lb (124.286 kg)  SpO2: 96%    PHYSICAL EXAM: General:  Chronically ill appearing. SOB with exertion. Mom present. Arrived in wheelchair.   HEENT: normal Neck: supple. JVP 10-12 cm. Carotids 2+ bilaterally; no bruits. No lymphadenopathy or thryomegaly appreciated. Cor: PMI normal. Regular  rate & rhythm. No rubs, gallops or murmurs. Has PICC on R with milrinone infusing.  Lungs: Crackles left base Abdomen: Obese; soft, nontender, non distended. No hepatosplenomegaly. No bruits or masses. Good bowel sounds. Extremities: no cyanosis, clubbing, rash, R and LLE 1+ edema Neuro: alert & orientedx3, cranial nerves grossly intact.    ASSESSMENT & PLAN: 1) Chronic systolic HF: Nonischemic cardiomyopathy s/p ST Jude CRT-D, ECHO EF 10-15% in 1/16. NYHA IIIb-IV on home milrinone at 0.375 mcg/kg/min.  Status post recent admission for VT and volume overload. End stage cardiomyopathy.  Poor candidate for advanced therapies (LVAD, transplant).  He lives  alone and does not have consistent help at home.  Mother is able to drive him to appts.  Would have to lose considerable weight for transplant.  We talked about all this today.  He is volume overloaded and needs more diuresis.  - Increase torsemide to 100 mg bid, increase KCl to 40 bid.  - Continue current milrinone 0.375 - Continue spironolactone at current dosing.  - BMET in 1 week, followup in 10-14 days. Need to follow K closely given recent VT in setting of hypokalemia.  - No beta blocker with low output, no ACEI with CKD.  2) Afib: S/P AV nodal ablation by Dr [name].  Continue amiodarone 200 mg bid (VT with decreased dose) and ranolazine.  TSH and AST/ALT recently normal, needs regular eye exams.  Continue warfarin, check CBC today.  3) CKD, stage III:  Weekly BMET.  4) VT: No recent VT on ranolazine and higher amiodarone as well as keeping K normal.   Followup in 10-14 days.   Marca Ancona 05/03/2014

## 2014-05-03 NOTE — Patient Instructions (Signed)
Increase Torsemide to 100 mg (5 tabs) Twice daily   Increase Potassium to 40 meq (2 tabs) Twice daily   Labs today  Your physician recommends that you schedule a follow-up appointment in: 2 weeks

## 2014-05-06 ENCOUNTER — Other Ambulatory Visit: Payer: Self-pay

## 2014-05-06 NOTE — Patient Outreach (Signed)
Patient reports no shortness of breath today. States that he continues to have a cough.  Reports that he has all of his meds as prescribed.    Reports that home health nurse is coming to see him today to redraw blood. Reports that his labs were abnormal.   Patient reports that he is following his low salt diet.  Denies any problems or concerns today.   Will continue to call patient weekly for transition of care.

## 2014-05-07 ENCOUNTER — Telehealth (HOSPITAL_COMMUNITY): Payer: Self-pay | Admitting: Vascular Surgery

## 2014-05-07 ENCOUNTER — Other Ambulatory Visit: Payer: Self-pay

## 2014-05-07 NOTE — Patient Outreach (Signed)
[facility]) Care Management  05/07/2014  Raymond [name] November 04, 1960 [id number]   CSW called patient to get progress update on goals. Patient reports that food stamp and medicaid applications have been submitted but there has been no reply as of yet. He reports that currently he is looking for an Art gallery manager but wants his insurance to pay for one. CSW reported that orders have to come from doctors office and even then insurance can choose not to cover the item. Patient reports he would like to buy a used scooter. CSW reccommended discount medical supply store. Patient reported he would look into that and other options. Patient denies any further needs. CSW made plans to follow-up in another 2 weeks to determine if patient has heard from social services regarding applications for assistance.

## 2014-05-07 NOTE — Telephone Encounter (Signed)
Gave order to continue seeing pt for endurance and gait training (per Dr.Mclean)

## 2014-05-07 NOTE — Telephone Encounter (Signed)
Physical therapist [facility] called for a recert order.. Please advise

## 2014-05-08 ENCOUNTER — Telehealth (HOSPITAL_COMMUNITY): Payer: Self-pay

## 2014-05-08 ENCOUNTER — Ambulatory Visit (INDEPENDENT_AMBULATORY_CARE_PROVIDER_SITE_OTHER): Payer: Medicare Other | Admitting: Family Medicine

## 2014-05-08 ENCOUNTER — Ambulatory Visit (INDEPENDENT_AMBULATORY_CARE_PROVIDER_SITE_OTHER): Payer: Medicare Other | Admitting: *Deleted

## 2014-05-08 ENCOUNTER — Encounter: Payer: Self-pay | Admitting: Family Medicine

## 2014-05-08 ENCOUNTER — Ambulatory Visit (INDEPENDENT_AMBULATORY_CARE_PROVIDER_SITE_OTHER): Payer: Medicare Other | Admitting: Cardiology

## 2014-05-08 VITALS — BP 132/82 | HR 92 | Temp 98.2°F | Resp 18 | Ht 69.0 in | Wt 265.0 lb

## 2014-05-08 DIAGNOSIS — I639 Cerebral infarction, unspecified: Secondary | ICD-10-CM | POA: Diagnosis not present

## 2014-05-08 DIAGNOSIS — D649 Anemia, unspecified: Secondary | ICD-10-CM | POA: Diagnosis not present

## 2014-05-08 DIAGNOSIS — I472 Ventricular tachycardia, unspecified: Secondary | ICD-10-CM

## 2014-05-08 DIAGNOSIS — E034 Atrophy of thyroid (acquired): Secondary | ICD-10-CM | POA: Diagnosis not present

## 2014-05-08 DIAGNOSIS — G4700 Insomnia, unspecified: Secondary | ICD-10-CM

## 2014-05-08 DIAGNOSIS — R5381 Other malaise: Secondary | ICD-10-CM

## 2014-05-08 DIAGNOSIS — Z9119 Patient's noncompliance with other medical treatment and regimen: Secondary | ICD-10-CM

## 2014-05-08 DIAGNOSIS — I429 Cardiomyopathy, unspecified: Secondary | ICD-10-CM

## 2014-05-08 DIAGNOSIS — E038 Other specified hypothyroidism: Secondary | ICD-10-CM

## 2014-05-08 DIAGNOSIS — I5022 Chronic systolic (congestive) heart failure: Secondary | ICD-10-CM

## 2014-05-08 DIAGNOSIS — Z5181 Encounter for therapeutic drug level monitoring: Secondary | ICD-10-CM

## 2014-05-08 DIAGNOSIS — R55 Syncope and collapse: Secondary | ICD-10-CM | POA: Diagnosis not present

## 2014-05-08 DIAGNOSIS — I428 Other cardiomyopathies: Secondary | ICD-10-CM

## 2014-05-08 DIAGNOSIS — Z91199 Patient's noncompliance with other medical treatment and regimen due to unspecified reason: Secondary | ICD-10-CM

## 2014-05-08 DIAGNOSIS — I4891 Unspecified atrial fibrillation: Secondary | ICD-10-CM

## 2014-05-08 LAB — T4, FREE: Free T4: 1.93 ng/dL — ABNORMAL HIGH (ref 0.80–1.80)

## 2014-05-08 LAB — POCT CBC
Granulocyte percent: 83.3 %G — AB (ref 37–80)
HCT, POC: 38.8 % — AB (ref 43.5–53.7)
Hemoglobin: 12.7 g/dL — AB (ref 14.1–18.1)
Lymph, poc: 0.8 (ref 0.6–3.4)
MCH, POC: 29.7 pg (ref 27–31.2)
MCHC: 32.7 g/dL (ref 31.8–35.4)
MCV: 90.9 fL (ref 80–97)
MID (CBC): 0.5 (ref 0–0.9)
MPV: 7 fL (ref 0–99.8)
POC GRANULOCYTE: 6.3 (ref 2–6.9)
POC LYMPH %: 10.7 % (ref 10–50)
POC MID %: 6 %M (ref 0–12)
Platelet Count, POC: 352 10*3/uL (ref 142–424)
RBC: 4.27 M/uL — AB (ref 4.69–6.13)
RDW, POC: 17.9 %
WBC: 7.6 10*3/uL (ref 4.6–10.2)

## 2014-05-08 LAB — TSH: TSH: 0.184 u[IU]/mL — ABNORMAL LOW (ref 0.350–4.500)

## 2014-05-08 LAB — COMPREHENSIVE METABOLIC PANEL
ALK PHOS: 97 U/L (ref 39–117)
ALT: 17 U/L (ref 0–53)
AST: 23 U/L (ref 0–37)
Albumin: 3.4 g/dL — ABNORMAL LOW (ref 3.5–5.2)
BILIRUBIN TOTAL: 1.3 mg/dL — AB (ref 0.2–1.2)
BUN: 16 mg/dL (ref 6–23)
CO2: 20 mEq/L (ref 19–32)
CREATININE: 1.34 mg/dL (ref 0.50–1.35)
Calcium: 9.2 mg/dL (ref 8.4–10.5)
Chloride: 98 mEq/L (ref 96–112)
GLUCOSE: 136 mg/dL — AB (ref 70–99)
POTASSIUM: 3.9 meq/L (ref 3.5–5.3)
Sodium: 136 mEq/L (ref 135–145)
Total Protein: 6.4 g/dL (ref 6.0–8.3)

## 2014-05-08 LAB — IBC PANEL
%SAT: 8 % — AB (ref 20–55)
TIBC: 417 ug/dL (ref 215–435)
UIBC: 384 ug/dL (ref 125–400)

## 2014-05-08 LAB — IRON: IRON: 33 ug/dL — AB (ref 42–165)

## 2014-05-08 LAB — IFOBT (OCCULT BLOOD): IMMUNOLOGICAL FECAL OCCULT BLOOD TEST: NEGATIVE

## 2014-05-08 LAB — POCT INR: INR: 2.7

## 2014-05-08 MED ORDER — FERROUS SULFATE 325 (65 FE) MG PO TABS: 325.0000 mg | ORAL_TABLET | Freq: Two times a day (BID) | ORAL | Status: AC

## 2014-05-08 MED ORDER — AMIODARONE HCL 400 MG PO TABS: 400.0000 mg | ORAL_TABLET | Freq: Two times a day (BID) | ORAL | Status: DC

## 2014-05-08 MED ORDER — ALPRAZOLAM 0.25 MG PO TABS: 0.2500 mg | ORAL_TABLET | Freq: Every evening | ORAL | Status: AC | PRN

## 2014-05-08 NOTE — Progress Notes (Signed)
Remote ICD transmission.   

## 2014-05-08 NOTE — Telephone Encounter (Signed)
Provider seeing patient at this time for routine hospital follow up.  States patient's defibrillator went off in parking lot and patient experienced syncopal episode.  Patient was shocked second time once in the office as well.  Will forward information to our providers Dr. Shirlee Latch and Bensimhon, as well as his EP provider Dr. [name].  Will wait to review epic visit note from Dr. Jerral Bonito before addressing possible changes or a sooner appointment.  Patient has appointment with our clinic on 4/29, flagged in appointment note to review this information at his upcoming appointment.  Ave Filter

## 2014-05-08 NOTE — Telephone Encounter (Signed)
Lab results reviewed with patient.  Per Amy Clegg NP-C, instructed patient to take Ferrous Sulfate 325mg  BID as well as change amiodarone to 400mg  BID.

## 2014-05-08 NOTE — Progress Notes (Signed)
Subjective:    Patient ID: Raymond [name], male    DOB: [birth year], 54 y.o.   MRN: [id number]  HPI  Raymond [name] is a 54 year old Caucasian male who presents today for TRANSITION INTO CARE for hospitalization and rehabilitation follow-up.  Below is parts of discharge summary:  Admit date: 04/09/2014 Discharge date: 04/20/2014  Discharge Diagnoses:  Principal Problem:  Physical debility Active Problems:  Non-ischemic cardiomyopathy  Chronic systolic heart failure  Left hemiparesis  Slow transit constipation  Gout flare   Discharged Condition: Stable   Labs:  Basic Metabolic Panel:  Last Labs      Recent Labs Lab 04/17/14 0653 04/18/14 0555 04/19/14 0515 04/20/14 0550  NA 133* 133* 134* 132*  K 3.7 3.3* 3.3* 3.9  CL 93* 91* 91* 95*  CO2 30 30 37* 31  GLUCOSE 123* 136* 131* 132*  BUN 32* 33* 39* 37*  CREATININE 1.75* 1.93* 2.02* 1.71*  CALCIUM 9.2 8.8 9.0 9.1  MG --  --  1.9 --       CBC:  Last Labs      Recent Labs Lab 04/17/14 0653 04/20/14 0550  WBC 11.3* 13.2*  HGB 13.2 13.7  HCT 36.2* 41.0  MCV 90.7 90.3  PLT 385 392      CBG:  Last Labs     No results for input(s): GLUCAP in the last 168 hours.    Brief HPI: Raymond [name] is a 54 y.o. 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 CXR with evidence of fluid overload, K+ at low normal levels and patient reported weight up by 4 lbs. Past admission, he was shocked for VT on the evening of 3/12 and again on the evening of 3/13. He was started on IV amiodarone and magnesium/potassium supplemented.  He was evaluated by Dr Graciela Husbands and started on ranolazine and has been transitioned back to po  amiodarone. He was diuresed with milrinone and lasix for acute on chronic systolic CHF with good results. Co-ox levels elevated today and recheck pending. He has had foot pain due to cellulitis and was started on doxycyline for treatment. Therapy initiated and patient noted to be significantly deconditioned and has been limited by right foot pain as well as left hemiparesis due to prior CVA. Rehab team /MD recommended CIR for follow up therapy and patient admitted today  Hospital Course: Raymond [name] was admitted to rehab 04/09/2014 for inpatient therapies to consist of PT, ST and OT at least three hours five days a week. Past admission physiatrist, therapy team and rehab RN have worked together to provide customized collaborative inpatient rehab. He continued to have complaints of right ankle pain and uric acid levels were elevated at 9.2. He was treated with slow steroid taper with improvement in pain management/symptoms. Cellulitis has resolved and completed his antibiotic course on 04/02. Blood pressures have been reasonably controlled. He had a brief break in his milrinone dosing on 03/26 with complaints of dizziness and cardiology was consulted for input. Metolazone was added to help with signs of overload with good diuresis and improvement in symptoms. Due to increase in creatinine to 2.02, torsemide was held X 24 hours and patient to continue 80 mg bid at discharge. He is to continue  on home milrinone at 0.375 mcg in addition to amiodarone 200 mg bid, ranolazine 1000 mg bid and spironolactone 25 mg bid.   During patient's stay in rehab weekly team conferences were held to monitor patient's progress, set goals and discuss barriers to discharge. Patient has had improvement in activity tolerance, balance, postural control, as well as ability to compensate for deficits. He is ambulating at supervision level. He requires SBA for stair navigation and min/CGA when in community setting. Concerns about  safety at home due to impulsivity as well as poor safety awareness were discussed with patient. Family education was done with girlfriend and. 24 hours supervision was recommended past discharge. Patient has declined SNF or ALF as per recommendations. He will continue to receive follow up HHPT, HHOT, HHRN and HHSW by [facility] past discharge. He was discharged to home on 04/20/14    Disposition: 01-Home or Self Care  Diet: Cardiac diet. Low salt.   Special Instructions: 1. Check weights daily.     Medication List    STOP taking these medications       doxycycline 100 MG tablet  Commonly known as: VIBRA-TABS     guaiFENesin-codeine 100-10 MG/5ML syrup     guaiFENesin-dextromethorphan 100-10 MG/5ML syrup  Commonly known as: ROBITUSSIN DM     HYDROcodone-acetaminophen 5-325 MG per tablet  Commonly known as: NORCO/VICODIN      TAKE these medications       acyclovir 800 MG tablet  Commonly known as: ZOVIRAX  Take 400 mg by mouth daily.     ALPRAZolam 0.25 MG tablet  Commonly known as: XANAX  Take 1 tablet (0.25 mg total) by mouth at bedtime as needed for anxiety.     amiodarone 200 MG tablet  Commonly known as: PACERONE  Take 1 tablet (200 mg total) by mouth 2 (two) times daily.     cyclobenzaprine 10 MG tablet  Commonly known as: FLEXERIL  Take 1 tablet (10 mg total) by mouth 2 (two) times daily.     levothyroxine 125 MCG tablet  Commonly known as: SYNTHROID, LEVOTHROID  Take 2 tablets (250 mcg total) by mouth daily before breakfast. Additional refill should come from PCP     magnesium oxide 400 (241.3 MG) MG tablet  Commonly known as: MAG-OX  Take 1 tablet (400 mg total) by mouth daily.     milrinone 20 MG/100ML Soln infusion  Commonly known as: PRIMACOR  Inject 45.225 mcg/min into the vein continuous.     NYQUIL COLD & FLU PO  Take 30 mLs by mouth daily as needed (for  cold).     Potassium Chloride ER 20 MEQ Tbcr  Take 20 mEq by mouth 2 (two) times daily.     predniSONE 10 MG tablet  Commonly known as: DELTASONE  Take 1 tablet (10 mg total) by mouth daily with breakfast.     ranolazine 1000 MG SR tablet  Commonly known as: RANEXA  Take 1 tablet (1,000 mg total) by mouth 2 (two) times daily.     RICOLA Lozg  Use as directed 1 lozenge in the mouth or throat daily as needed (for cough).     senna-docusate 8.6-50 MG per tablet  Commonly known as: Senokot-S  Take 2 tablets by mouth at bedtime.     spironolactone 25 MG tablet  Commonly known as: ALDACTONE  Take 1 tablet (25 mg total) by mouth 2 (two) times daily.     torsemide 20 MG tablet  Commonly known as: DEMADEX  Take 4 tablets (80 mg total) by mouth 2 (two) times daily.     warfarin 5 MG tablet  Commonly known as: COUMADIN  Take 1 tablet (5 mg total) by mouth daily at 6 PM.           Follow-up Information    Follow up with Nilda Simmer, MD On 05/08/2014.   Specialty: Family Medicine   Why: @ 11:00 am   Contact information:   [address] [place] [address] [phone]       Follow up with [name], MD.   Specialty: Physical Medicine and Rehabilitation   Why: As needed   Contact information:   [address], [address] [place] [address] [phone]       Follow up with Arvilla Meres, MD. Call in 2 days.   Specialty: Cardiology   Why: for follow up appointment   Contact information:   [address] [place] [address] [phone]       Signed: Jacquelynn Cree 04/23/2014, 10:00 AM           Pt was admitted with cardiogenic shock in January 2016.  Warranted dual pressors and discharged on Milrinone.  He was diuresed with iv lasix and transitioned to torsemide  daily; discharge weight in January 2016 of 275 pounds.  With  March 2016 admission, amiodarone was decreased and torsemide was increased.  He suffered multiple episodes of VT with ICD discharges in setting of hypokalemia.  Amiodarone was increased and ranolazine was added during admission.  He was diuresed with iv lasix.  He was then transitioned to inpatient rehab.  He was admitted to [facility] on 03/30/14 following a syncopal episode. He was previously seen in the Emergency Department at [facility] on 03/28/14 for a CHF exacerbation, for which they increased his diuretic and sent him home. Two days later, he was getting up to use the bathroom and the next thing he remembers is waking up on the floor. He doesn't remember falling or hitting his head. He was able to call his girlfriend who then drove him to the Emergency Department. On the way to the hospital, he had two defibrillation episodes, and per the patient, proceeded to have 14 more episodes during his hospital stay.  He was discharged on 04/09/14, after spending 8 days in rehabilitation. He currently has home health services that come to his house on Thursdays to do physical therapy with him.   Today, he had two defibrillation episodes in the triage room, which were the first episodes since being discharged from the hospital. His mother and a CMA were next to him and observed the episode. Mitchell knew he was having the episode and that his defibrillator was going off, as he precipitated the event by saying "[place]ah." The event lasted about 5 seconds per the CMA. He did not lose consciousness or bowel/bladder control. He was able to look at the [facility] and his mother during the defibrillation episode. His right leg twitched, but no significant tonic-clonic movements were observed.   He still reports having palpitations that are worse when he feels anxious or nervous. Today he is visibly nervous and anxious. He continues to have shortness of breath and cough. Weighs himself regularly at home each day, with a  baseline weight of 260 pounds. He ambulates with a cane at home and reports no problem with this. He does not eat consistent meals throughout the day because he doesn't want to get up  from his chair and fix himself something to eat. He drinks diet mountain dew or diet pepsi during the day. He reports fatigue and tiredness, which is why he doesn't want to make himself meals. Recently started eating Kind bars as a snack during the day.   He is followed closely by his cardiologist, currently every two weeks. [facility] was notified today of the defibrillation episode.  Last appointment with cardiology 05/02/14.  Torsemide was increased to  bid; potassium was increased to bid.  NO B blokcer due to low ouput; no ACEI due to CKD. Atrial fibrillation: s/p AV nodal ablation by [name] in past.  Amiodarone and Ranolazine.  Warfarin for anticoagulation. V Tach: maintained on Ranolazine and amiodarone. ICD  Anemia: detected at recent cardiology visit with Hgb of 9.2 which was a significant drop from 13.7 two weeks ago.  Patient reports gray stools for the past several weeks; this was occuring during admission. Denies bloody stools or melena.  Denies other sites of bleeding; denies hematuria, epistaxis, gums bleeding.  Maintained on Coumadin and had INR obtained today of 2.7.    Hypothyroidism: Patient reports good compliance with medication, good tolerance to medication, and good symptom control.    Insomnia and anxiety: requesting refill of Xanax; uses sparingly and mostly at night for insomnia.    Review of Systems  Constitutional: Positive for fatigue. Negative for fever, chills and diaphoresis.  HENT: Negative.   Eyes: Negative.   Respiratory: Positive for cough and shortness of breath. Negative for chest tightness.   Cardiovascular: Positive for palpitations. Negative for chest pain and leg swelling.  Gastrointestinal: Negative for nausea, vomiting, abdominal pain, diarrhea, constipation,  blood in stool, abdominal distention, anal bleeding and rectal pain.  Genitourinary: Positive for frequency. Negative for dysuria.  Musculoskeletal: Negative for myalgias and arthralgias.  Skin: Negative for color change and rash.  Neurological: Negative for dizziness, seizures, syncope, weakness, light-headedness, numbness and headaches.  Psychiatric/Behavioral: Positive for sleep disturbance. The patient is nervous/anxious.        Objective:   Physical Exam  Constitutional: He is oriented to person, place, and time. He appears well-developed and well-nourished. No distress.  BP 132/82 mmHg  Pulse 92  Temp(Src) 98.2 F (36.8 C) (Oral)  Resp 18  Ht  (1.753 m)  Wt 265 lb (120.203 kg)  BMI 39.12 kg/m2  SpO2 96%  In wheelchair; mother present.  HENT:  Head: Normocephalic and atraumatic.  Right Ear: External ear normal.  Left Ear: External ear normal.  Nose: Nose normal.  Mouth/Throat: Oropharynx is clear and moist.  Eyes: Conjunctivae are normal. Pupils are equal, round, and reactive to light. No scleral icterus.  Neck: Neck supple. No thyromegaly present.  Cardiovascular: Normal rate, regular rhythm and intact distal pulses.   Pulses:      Dorsalis pedis pulses are 2+ on the right side, and 2+ on the left side.       Posterior tibial pulses are 2+ on the right side, and 2+ on the left side.  Pulmonary/Chest: Effort normal and breath sounds normal. He has no wheezes. He has no rales.  Abdominal: Soft. Bowel sounds are normal. He exhibits no distension and no mass. There is no tenderness. There is no rebound and no guarding.  Genitourinary: Rectum normal.  Musculoskeletal: Normal range of motion. He exhibits no edema.  Lymphadenopathy:    He has no cervical adenopathy.  Neurological: He is alert and oriented to person, place, and time.  Skin: Skin is  warm and dry. No rash noted. No erythema. No pallor.  Psychiatric: His behavior is normal. His mood appears anxious.        EKG: ventricular pacer  Assessment & Plan:  1. Anemia, unspecified anemia type -New; improved today from last week; expect lab error. Improved from recent episode at cardiologist office. Hemosure obtained in office today; negative for blood. - POCT CBC - Iron - IBC panel - IFOBT POC (occult bld, rslt in office)  2. VT (ventricular tachycardia) -Defibrillator went off twice in office today during triage process. EKG was done and lab work ordered.  Cardiology notified of two events; hemodynamically stable in office.   - Comprehensive metabolic panel - EKG 12-Lead  3. Syncope and collapse Resolved from hospital encounter. He has not had a syncopal episode since prior to hospital admission. He has not fallen. Today was the first time his defibrillator went off after being discharged from the hospital.  - Comprehensive metabolic panel - EKG 12-Lead  4. Physical debility -Ambulates with a cane, is driven to appointments by his mother. His girlfriend fills his medications and puts them in a pill box each Sunday. Receiving physical therapy at home.  Sedentary at home otherwise.  -S/p inpatient rehab stay; refused skilled nursing placement.  5. Non-ischemic cardiomyopathy -Stable.  Defibrillator in place.  Diuresis per cardiology; weight down two pounds. Current baseline weight of 160.  Still above goal. Weighing daily.   - Comprehensive metabolic panel - EKG 12-Lead  6. Noncompliance -Chronic. Discussed the importance of eating regular meals and compliance with medications.    7. Hypothyroidism due to acquired atrophy of thyroid Stable. Recheck levels. - TSH - T4, free   Kristi Paulita Fujita, M.D. [facility] [address] [place], [place]  [address] [phone] phone [phone] fax

## 2014-05-09 ENCOUNTER — Telehealth: Payer: Self-pay

## 2014-05-09 ENCOUNTER — Encounter: Payer: Self-pay | Admitting: Internal Medicine

## 2014-05-09 ENCOUNTER — Telehealth (HOSPITAL_COMMUNITY): Payer: Self-pay | Admitting: Vascular Surgery

## 2014-05-09 DIAGNOSIS — I5022 Chronic systolic (congestive) heart failure: Secondary | ICD-10-CM

## 2014-05-09 MED ORDER — AMIODARONE HCL 200 MG PO TABS: 400.0000 mg | ORAL_TABLET | Freq: Two times a day (BID) | ORAL | Status: AC

## 2014-05-09 MED ORDER — BENZONATATE 100 MG PO CAPS: 100.0000 mg | ORAL_CAPSULE | Freq: Three times a day (TID) | ORAL | Status: AC | PRN

## 2014-05-09 NOTE — Telephone Encounter (Signed)
Darlene patients wife called to check to see if patients prescription for Hydrocodone cough medicine was ready. I informed her it was not and I didn't see a message in the system in regards to it. She stated patient has been up all night coughing and unable to sleep. She had thought his mother had called this morning but no record of any message. Please call Darlene at [phone]/ or [phone] when ready to be picked up.

## 2014-05-09 NOTE — Telephone Encounter (Signed)
[facility] called pt girlfriend called her pt has been sick throwing up last night, he defib went off last night, pt would like a referral to hospice .Marland Kitchen Please advise [phone]

## 2014-05-09 NOTE — Telephone Encounter (Signed)
Pharmacy called to report RX sent in for Ami 400mg  tab is considered a tier 4 med and co pay will be $200+, 200 mg tabs are considered tier 2 and will be much cheaper rx corrected and re sent into pharm   Per VO Tonye Becket, NP Ok for pt to have Hospice referral but pt should know once hospice starts [facility] will stop  Spoke with pt and pts family they agree to hospisce referral

## 2014-05-09 NOTE — Telephone Encounter (Signed)
Pt called to check on rx. Thank you

## 2014-05-09 NOTE — Telephone Encounter (Signed)
Evaluated patient yesterday and he did not request medication; has the cough worsened since the visit last night?  What has patient tried for cough?  I have sent in [facility] for now.

## 2014-05-09 NOTE — Telephone Encounter (Signed)
Patient's girlfriend Darlene calling to see if Dr Katrinka Blazing would be able to prescribe the cough medicine. She would like to pick it up tonight if possible. Patient call back number is [phone]/ or [phone]

## 2014-05-10 ENCOUNTER — Telehealth: Payer: Self-pay | Admitting: *Deleted

## 2014-05-10 ENCOUNTER — Ambulatory Visit: Payer: Medicare Other

## 2014-05-10 NOTE — Telephone Encounter (Signed)
Left message Rx was called in

## 2014-05-10 NOTE — Telephone Encounter (Signed)
Spoke to pt about ICD shocks (3)  he received on 4/20. Patient states that he remembers feeling fatigued, but denies any sx's of CP, palps, or SOB. He states that his mother told him that he was "out" for about 30 sec, but he does not remember this.  I asked patient if he had taken his medications the am of the 20th. Patient admits to not taking his medications bc he had a doctor's appointment, and being that all of his medications were in the same pill box he was unable to tell his diuretics from the others. I asked pt if he would be able to come into the ofc today to f/u w/MD. Pt states that his mother is currently working and he would not have a way to get to the office.   Patient is aware of driving restrictions.  Will defer further recommendations to GT.

## 2014-05-10 NOTE — Telephone Encounter (Signed)
I explained to patient the importance of taking his medications as scheduled. Patient voiced understanding.

## 2014-05-10 NOTE — Telephone Encounter (Signed)
Spoke with Chip Boer at [facility] [phone] Will begin hospice services/ intake

## 2014-05-10 NOTE — Telephone Encounter (Signed)
Per GT---f/u w/ CHF clinic as scheduled. No changes.sss

## 2014-05-11 ENCOUNTER — Telehealth: Payer: Self-pay | Admitting: Family Medicine

## 2014-05-11 MED ORDER — GUAIFENESIN-CODEINE 100-10 MG/5ML PO SYRP: 5.0000 mL | ORAL_SOLUTION | Freq: Four times a day (QID) | ORAL | Status: AC | PRN

## 2014-05-11 NOTE — Telephone Encounter (Signed)
Girlfirend called.  Tessalon perles not working for cough that started to worsen two days ago.  No fever/chills/sweats.  No sore throat, ear pain, rhinorrhea, nasal congestion.  No sputum production; no worsening DOE or orthopnea.  Has tried Delsym and Nyquil cough without relief.  Weight is unchanged today.  A/P: cough: robitussin with codeine called into [facility] in [place].

## 2014-05-13 ENCOUNTER — Telehealth: Payer: Self-pay | Admitting: Licensed Clinical Social Worker

## 2014-05-13 ENCOUNTER — Other Ambulatory Visit: Payer: Self-pay

## 2014-05-13 NOTE — Patient Outreach (Addendum)
Transition of care call: Patient did not weigh today. Educated on importance of daily weights.  Reports cough is deceased.  Reports that he refused hospice because he would not be able to continue his meds.   Patient remains active with [facility].  Offered home visit on 4/28.  Accepted.  Confirmed address.  Will assess needs at home visit.  Rowe Pavy, RN, BSN, [facility] [employer] [phone]

## 2014-05-13 NOTE — Telephone Encounter (Signed)
CSW received message form patient's sister Raymond [name] requesting a return call from CSW. CSW contacted patient to inform and request permission to speak with sister. Patient agreed to allow CSW to speak with sister and list her as emergency contact in EPIC. Patient also stated that he did not agree to hospice referral as he "don't want to stop my heart failure medication and die". CSW attempted to speak further about hospice services although patient was not interested in discussing hospice at the moment. Patient stated "I am tired of all the calls today". CSW offered support and offered return call at another time. Patient was open to return call from CSW. Will follow up in a few days to continue to offer support. Raymond Beech, LCSW [phone]

## 2014-05-14 ENCOUNTER — Inpatient Hospital Stay (HOSPITAL_COMMUNITY)
Admission: EM | Admit: 2014-05-14 | Discharge: 2014-06-19 | DRG: 308 | Disposition: E | Payer: Medicare Other | Attending: Internal Medicine | Admitting: Internal Medicine

## 2014-05-14 ENCOUNTER — Encounter (HOSPITAL_COMMUNITY): Payer: Self-pay | Admitting: *Deleted

## 2014-05-14 DIAGNOSIS — I472 Ventricular tachycardia, unspecified: Secondary | ICD-10-CM

## 2014-05-14 DIAGNOSIS — Z515 Encounter for palliative care: Secondary | ICD-10-CM

## 2014-05-14 DIAGNOSIS — I481 Persistent atrial fibrillation: Secondary | ICD-10-CM | POA: Diagnosis not present

## 2014-05-14 DIAGNOSIS — N183 Chronic kidney disease, stage 3 unspecified: Secondary | ICD-10-CM | POA: Diagnosis present

## 2014-05-14 DIAGNOSIS — Z95810 Presence of automatic (implantable) cardiac defibrillator: Secondary | ICD-10-CM

## 2014-05-14 DIAGNOSIS — E039 Hypothyroidism, unspecified: Secondary | ICD-10-CM | POA: Diagnosis present

## 2014-05-14 DIAGNOSIS — N189 Chronic kidney disease, unspecified: Secondary | ICD-10-CM | POA: Diagnosis not present

## 2014-05-14 DIAGNOSIS — I482 Chronic atrial fibrillation: Secondary | ICD-10-CM | POA: Diagnosis present

## 2014-05-14 DIAGNOSIS — I4892 Unspecified atrial flutter: Secondary | ICD-10-CM | POA: Diagnosis present

## 2014-05-14 DIAGNOSIS — R570 Cardiogenic shock: Secondary | ICD-10-CM | POA: Diagnosis present

## 2014-05-14 DIAGNOSIS — E876 Hypokalemia: Secondary | ICD-10-CM | POA: Diagnosis present

## 2014-05-14 DIAGNOSIS — I4891 Unspecified atrial fibrillation: Secondary | ICD-10-CM | POA: Diagnosis present

## 2014-05-14 DIAGNOSIS — R0600 Dyspnea, unspecified: Secondary | ICD-10-CM

## 2014-05-14 DIAGNOSIS — M549 Dorsalgia, unspecified: Secondary | ICD-10-CM | POA: Diagnosis not present

## 2014-05-14 DIAGNOSIS — I429 Cardiomyopathy, unspecified: Secondary | ICD-10-CM | POA: Diagnosis present

## 2014-05-14 DIAGNOSIS — M109 Gout, unspecified: Secondary | ICD-10-CM | POA: Diagnosis present

## 2014-05-14 DIAGNOSIS — Z4502 Encounter for adjustment and management of automatic implantable cardiac defibrillator: Secondary | ICD-10-CM

## 2014-05-14 DIAGNOSIS — R0602 Shortness of breath: Secondary | ICD-10-CM | POA: Diagnosis not present

## 2014-05-14 DIAGNOSIS — I130 Hypertensive heart and chronic kidney disease with heart failure and stage 1 through stage 4 chronic kidney disease, or unspecified chronic kidney disease: Secondary | ICD-10-CM | POA: Diagnosis present

## 2014-05-14 DIAGNOSIS — I5023 Acute on chronic systolic (congestive) heart failure: Secondary | ICD-10-CM | POA: Diagnosis present

## 2014-05-14 DIAGNOSIS — Z7901 Long term (current) use of anticoagulants: Secondary | ICD-10-CM

## 2014-05-14 DIAGNOSIS — E669 Obesity, unspecified: Secondary | ICD-10-CM | POA: Diagnosis present

## 2014-05-14 DIAGNOSIS — N179 Acute kidney failure, unspecified: Secondary | ICD-10-CM | POA: Diagnosis present

## 2014-05-14 DIAGNOSIS — Z6841 Body Mass Index (BMI) 40.0 and over, adult: Secondary | ICD-10-CM

## 2014-05-14 DIAGNOSIS — Z66 Do not resuscitate: Secondary | ICD-10-CM | POA: Diagnosis present

## 2014-05-14 DIAGNOSIS — I69354 Hemiplegia and hemiparesis following cerebral infarction affecting left non-dominant side: Secondary | ICD-10-CM

## 2014-05-14 DIAGNOSIS — F419 Anxiety disorder, unspecified: Secondary | ICD-10-CM | POA: Diagnosis present

## 2014-05-14 HISTORY — DX: Reserved for inherently not codable concepts without codable children: IMO0001

## 2014-05-14 HISTORY — DX: Chronic kidney disease, unspecified: N18.9

## 2014-05-14 LAB — COMPREHENSIVE METABOLIC PANEL
ALT: 19 U/L (ref 0–53)
AST: 25 U/L (ref 0–37)
Albumin: 3 g/dL — ABNORMAL LOW (ref 3.5–5.2)
Alkaline Phosphatase: 109 U/L (ref 39–117)
Anion gap: 14 (ref 5–15)
BUN: 13 mg/dL (ref 6–23)
CHLORIDE: 98 mmol/L (ref 96–112)
CO2: 20 mmol/L (ref 19–32)
CREATININE: 1.6 mg/dL — AB (ref 0.50–1.35)
Calcium: 9 mg/dL (ref 8.4–10.5)
GFR calc non Af Amer: 48 mL/min — ABNORMAL LOW (ref >90)
GFR, EST AFRICAN AMERICAN: 56 mL/min — AB (ref >90)
Glucose, Bld: 159 mg/dL — ABNORMAL HIGH (ref 70–99)
Potassium: 3.6 mmol/L (ref 3.5–5.1)
Sodium: 132 mmol/L — ABNORMAL LOW (ref 135–145)
Total Bilirubin: 1.7 mg/dL — ABNORMAL HIGH (ref 0.3–1.2)
Total Protein: 6.3 g/dL (ref 6.0–8.3)

## 2014-05-14 LAB — CBC WITH DIFFERENTIAL/PLATELET
Basophils Absolute: 0 10*3/uL (ref 0.0–0.1)
Basophils Relative: 1 % (ref 0–1)
EOS PCT: 1 % (ref 0–5)
Eosinophils Absolute: 0.1 10*3/uL (ref 0.0–0.7)
HCT: 37.6 % — ABNORMAL LOW (ref 39.0–52.0)
Hemoglobin: 12.2 g/dL — ABNORMAL LOW (ref 13.0–17.0)
LYMPHS ABS: 0.6 10*3/uL — AB (ref 0.7–4.0)
LYMPHS PCT: 7 % — AB (ref 12–46)
MCH: 29.6 pg (ref 26.0–34.0)
MCHC: 32.4 g/dL (ref 30.0–36.0)
MCV: 91.3 fL (ref 78.0–100.0)
Monocytes Absolute: 0.6 10*3/uL (ref 0.1–1.0)
Monocytes Relative: 7 % (ref 3–12)
NEUTROS ABS: 6.9 10*3/uL (ref 1.7–7.7)
NEUTROS PCT: 84 % — AB (ref 43–77)
PLATELETS: 307 10*3/uL (ref 150–400)
RBC: 4.12 MIL/uL — AB (ref 4.22–5.81)
RDW: 16.5 % — ABNORMAL HIGH (ref 11.5–15.5)
WBC: 8.1 10*3/uL (ref 4.0–10.5)

## 2014-05-14 LAB — MAGNESIUM: MAGNESIUM: 1.9 mg/dL (ref 1.5–2.5)

## 2014-05-14 LAB — TSH: TSH: 0.131 u[IU]/mL — ABNORMAL LOW (ref 0.350–4.500)

## 2014-05-14 LAB — PROTIME-INR
INR: 1.54 — AB (ref 0.00–1.49)
INR: 1.73 — AB (ref 0.00–1.49)
Prothrombin Time: 18.7 seconds — ABNORMAL HIGH (ref 11.6–15.2)
Prothrombin Time: 20.4 seconds — ABNORMAL HIGH (ref 11.6–15.2)

## 2014-05-14 LAB — BRAIN NATRIURETIC PEPTIDE: B Natriuretic Peptide: 1058.3 pg/mL — ABNORMAL HIGH (ref 0.0–100.0)

## 2014-05-14 LAB — I-STAT TROPONIN, ED: TROPONIN I, POC: 0.08 ng/mL (ref 0.00–0.08)

## 2014-05-14 LAB — PHOSPHORUS: PHOSPHORUS: 3 mg/dL (ref 2.3–4.6)

## 2014-05-14 LAB — MRSA PCR SCREENING: MRSA by PCR: NEGATIVE

## 2014-05-14 MED ORDER — POTASSIUM CHLORIDE ER 10 MEQ PO TBCR
40.0000 meq | EXTENDED_RELEASE_TABLET | Freq: Two times a day (BID) | ORAL | Status: DC
  Administered 2014-05-14: 40 meq via ORAL
  Filled 2014-05-14 (×4): qty 4

## 2014-05-14 MED ORDER — SPIRONOLACTONE 25 MG PO TABS
25.0000 mg | ORAL_TABLET | Freq: Two times a day (BID) | ORAL | Status: DC
  Administered 2014-05-14 – 2014-05-20 (×9): 25 mg via ORAL
  Filled 2014-05-14 (×15): qty 1

## 2014-05-14 MED ORDER — TORSEMIDE 100 MG PO TABS
100.0000 mg | ORAL_TABLET | Freq: Two times a day (BID) | ORAL | Status: DC
  Administered 2014-05-14 – 2014-05-16 (×3): 100 mg via ORAL
  Filled 2014-05-14 (×7): qty 1

## 2014-05-14 MED ORDER — LORAZEPAM 2 MG/ML IJ SOLN
0.5000 mg | Freq: Once | INTRAMUSCULAR | Status: AC
  Administered 2014-05-14: 0.5 mg via INTRAVENOUS
  Filled 2014-05-14: qty 1

## 2014-05-14 MED ORDER — RICOLA MT LOZG: 1.0000 | LOZENGE | Freq: Every day | OROMUCOSAL | Status: DC | PRN

## 2014-05-14 MED ORDER — MENTHOL 3 MG MT LOZG: 1.0000 | LOZENGE | OROMUCOSAL | Status: DC | PRN

## 2014-05-14 MED ORDER — SODIUM CHLORIDE 0.9 % IJ SOLN
3.0000 mL | Freq: Two times a day (BID) | INTRAMUSCULAR | Status: DC
  Administered 2014-05-14 – 2014-05-24 (×12): 3 mL via INTRAVENOUS
  Administered 2014-05-28: 10 mL via INTRAVENOUS
  Administered 2014-05-29: 3 mL via INTRAVENOUS

## 2014-05-14 MED ORDER — POTASSIUM CHLORIDE ER 10 MEQ PO TBCR
40.0000 meq | EXTENDED_RELEASE_TABLET | Freq: Once | ORAL | Status: DC
  Filled 2014-05-14: qty 4

## 2014-05-14 MED ORDER — GUAIFENESIN-CODEINE 100-10 MG/5ML PO SOLN
5.0000 mL | Freq: Four times a day (QID) | ORAL | Status: DC | PRN
  Administered 2014-05-14 – 2014-05-22 (×9): 5 mL via ORAL
  Filled 2014-05-14 (×9): qty 5

## 2014-05-14 MED ORDER — WARFARIN SODIUM 10 MG PO TABS
10.0000 mg | ORAL_TABLET | Freq: Once | ORAL | Status: AC
  Administered 2014-05-14: 10 mg via ORAL
  Filled 2014-05-14: qty 1

## 2014-05-14 MED ORDER — ALPRAZOLAM 0.5 MG PO TABS
0.5000 mg | ORAL_TABLET | Freq: Three times a day (TID) | ORAL | Status: DC | PRN
  Administered 2014-05-14 – 2014-05-27 (×22): 0.5 mg via ORAL
  Filled 2014-05-14 (×17): qty 1
  Filled 2014-05-14: qty 2
  Filled 2014-05-14 (×8): qty 1

## 2014-05-14 MED ORDER — WARFARIN - PHARMACIST DOSING INPATIENT
Freq: Every day | Status: DC
  Administered 2014-05-14 – 2014-05-18 (×3)
  Administered 2014-05-19: 1

## 2014-05-14 MED ORDER — RANOLAZINE ER 500 MG PO TB12
1000.0000 mg | ORAL_TABLET | Freq: Two times a day (BID) | ORAL | Status: DC
  Administered 2014-05-14 – 2014-05-20 (×11): 1000 mg via ORAL
  Filled 2014-05-14 (×14): qty 2

## 2014-05-14 MED ORDER — LEVOTHYROXINE SODIUM 125 MCG PO TABS
250.0000 ug | ORAL_TABLET | Freq: Every day | ORAL | Status: DC
  Administered 2014-05-14 – 2014-05-20 (×7): 250 ug via ORAL
  Filled 2014-05-14 (×8): qty 2

## 2014-05-14 MED ORDER — MEXILETINE HCL 200 MG PO CAPS
200.0000 mg | ORAL_CAPSULE | Freq: Three times a day (TID) | ORAL | Status: DC
  Administered 2014-05-15 – 2014-05-20 (×16): 200 mg via ORAL
  Filled 2014-05-14 (×20): qty 1

## 2014-05-14 MED ORDER — SENNOSIDES-DOCUSATE SODIUM 8.6-50 MG PO TABS
2.0000 | ORAL_TABLET | Freq: Every day | ORAL | Status: DC
  Administered 2014-05-15 – 2014-05-21 (×3): 2 via ORAL
  Filled 2014-05-14 (×10): qty 2

## 2014-05-14 MED ORDER — MILRINONE LACTATE 50 MG/50ML IV SOLN: 0.3750 ug/kg/min | INTRAVENOUS | Status: DC

## 2014-05-14 MED ORDER — ACETAMINOPHEN 325 MG PO TABS
650.0000 mg | ORAL_TABLET | ORAL | Status: DC | PRN
  Administered 2014-05-20: 650 mg via ORAL
  Filled 2014-05-14: qty 2

## 2014-05-14 MED ORDER — SODIUM CHLORIDE 0.9 % IV SOLN: 250.0000 mL | INTRAVENOUS | Status: DC | PRN

## 2014-05-14 MED ORDER — ONDANSETRON HCL 4 MG/2ML IJ SOLN
4.0000 mg | Freq: Four times a day (QID) | INTRAMUSCULAR | Status: DC | PRN
  Administered 2014-05-22: 4 mg via INTRAVENOUS
  Filled 2014-05-14: qty 2

## 2014-05-14 MED ORDER — MAGNESIUM OXIDE 400 (241.3 MG) MG PO TABS
400.0000 mg | ORAL_TABLET | Freq: Every day | ORAL | Status: DC
  Administered 2014-05-14 – 2014-05-23 (×9): 400 mg via ORAL
  Filled 2014-05-14 (×11): qty 1

## 2014-05-14 MED ORDER — SODIUM CHLORIDE 0.9 % IJ SOLN
10.0000 mL | INTRAMUSCULAR | Status: DC | PRN
  Administered 2014-05-15 (×2): 10 mL
  Administered 2014-05-18: 30 mL
  Administered 2014-05-18 – 2014-05-27 (×7): 10 mL
  Filled 2014-05-14 (×10): qty 40

## 2014-05-14 MED ORDER — POTASSIUM CHLORIDE 10 MEQ/100ML IV SOLN
10.0000 meq | Freq: Once | INTRAVENOUS | Status: AC
  Administered 2014-05-14: 10 meq via INTRAVENOUS
  Filled 2014-05-14: qty 100

## 2014-05-14 MED ORDER — BENZONATATE 100 MG PO CAPS
100.0000 mg | ORAL_CAPSULE | Freq: Three times a day (TID) | ORAL | Status: DC | PRN
  Administered 2014-05-14 – 2014-05-15 (×2): 200 mg via ORAL
  Administered 2014-05-15 – 2014-05-27 (×6): 100 mg via ORAL
  Filled 2014-05-14 (×10): qty 2

## 2014-05-14 MED ORDER — MILRINONE IN DEXTROSE 20 MG/100ML IV SOLN
0.3750 ug/kg/min | INTRAVENOUS | Status: DC
  Administered 2014-05-14 – 2014-05-27 (×33): 0.375 ug/kg/min via INTRAVENOUS
  Filled 2014-05-14 (×41): qty 100

## 2014-05-14 MED ORDER — LORAZEPAM 1 MG PO TABS: 1.0000 mg | ORAL_TABLET | Freq: Once | ORAL | Status: DC

## 2014-05-14 MED ORDER — AMIODARONE HCL 200 MG PO TABS
400.0000 mg | ORAL_TABLET | Freq: Two times a day (BID) | ORAL | Status: DC
  Administered 2014-05-14 – 2014-05-20 (×12): 400 mg via ORAL
  Filled 2014-05-14 (×14): qty 2

## 2014-05-14 MED ORDER — SODIUM CHLORIDE 0.9 % IJ SOLN: 3.0000 mL | INTRAMUSCULAR | Status: DC | PRN

## 2014-05-14 MED ORDER — MAGNESIUM SULFATE 2 GM/50ML IV SOLN
2.0000 g | Freq: Once | INTRAVENOUS | Status: AC
  Administered 2014-05-14: 2 g via INTRAVENOUS
  Filled 2014-05-14: qty 50

## 2014-05-14 MED ORDER — SODIUM CHLORIDE 0.9 % IV BOLUS (SEPSIS)
250.0000 mL | Freq: Once | INTRAVENOUS | Status: AC
  Administered 2014-05-14: 250 mL via INTRAVENOUS

## 2014-05-14 MED ORDER — ACYCLOVIR 400 MG PO TABS
400.0000 mg | ORAL_TABLET | Freq: Every day | ORAL | Status: DC
  Administered 2014-05-14 – 2014-05-20 (×7): 400 mg via ORAL
  Filled 2014-05-14 (×7): qty 1

## 2014-05-14 MED ORDER — CYCLOBENZAPRINE HCL 10 MG PO TABS
10.0000 mg | ORAL_TABLET | Freq: Two times a day (BID) | ORAL | Status: DC
  Administered 2014-05-14 – 2014-05-23 (×19): 10 mg via ORAL
  Filled 2014-05-14 (×25): qty 1

## 2014-05-14 MED ORDER — FERROUS SULFATE 325 (65 FE) MG PO TABS
325.0000 mg | ORAL_TABLET | Freq: Two times a day (BID) | ORAL | Status: DC
  Administered 2014-05-14 – 2014-05-20 (×10): 325 mg via ORAL
  Filled 2014-05-14 (×16): qty 1

## 2014-05-14 NOTE — Consult Note (Signed)
ELECTROPHYSIOLOGY CONSULT NOTE    Patient ID: Raymond [name] MRN: [id number], DOB/AGE: 20-May-1960 54 y.o.  Admit date: 05-22-14 Date of Consult: 2014-05-22  Primary Physician: Nilda Simmer, MD Primary Cardiologist: Bensimhon Electrophysiologist: [name]  Reason for Consultation: ICD shocks  HPI:  Raymond [name] is a 54 y.o. male with a past medical history significant for non-ischemic cardiomyopathy (s/p STJ CRTD), atrial fibrillation (s/p AVN ablation), hypertension, prior CVA, chronic kidney disease, and recurrent ventricular tachycardia (on amiodarone).  He has had multiple admissions for heart failure and has been seen by palliative care, but is not ready for hospice.  He is felt to be a poor candidate for LVAD or transplant.   Yesterday he received several ICD shocks and presented to the ER for further evaluation.  He reports syncope with episodes.  He reports compliance with medications.  Lab work is notable for normal K.     Echo 01/2014 demonstrated EF 10-15%, diffuse hypokinesis, grade 3 diastolic dysfunction, functional MR with central jet, LA severely dilated (58), PA pressure 64.  He has chronic shortness of breath that has been stable.  He reports that his scale at home is broken and he is unsure of weights.  He has had intermittent non-exertional chest tightness associated with ICD shocks.  He has not had recent fevers, chills, vomiting.  He has had some nausea.   Past Medical History  Diagnosis Date  . Non-ischemic cardiomyopathy     a. echo 11/10: EF 15%;   b. cath 10/08: normal cors;  c. Myoview 5/12: Very mild distal ant and apical isch, EF 17% (low risk-medical Rx cont'd);  d.  Echo 4/14:  EF 15%;  e. 05/2009 s/p SJM BiV ICD;  f. 07/2012 TEE: EF 20-25%, diff HK mild MR, mod TR.   Marland Kitchen Atrial fibrillation     a. on coumadin;  b. 07/2012 s/p TEE/DCCV. c. Recurrence 08/2012 in setting of abnormal thyroid panel.; 12/2012 AVN ablation by Dr [name]  . Hypertension   . Obesity    . Non-compliance   . Dyslexia     unable to read.  Marland Kitchen History of CVA (cerebrovascular accident) 11/2008    a. 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 RBBB (right bundle branch block)   . Cough syncope     a. During 08/2012 adm.  . Systolic CHF, chronic 01/19/1996  . Hypothyroidism 01/19/1996    s/p RAI therapy  . CKD (chronic kidney disease)   . Gout 04/23/2014       . Shortness of breath dyspnea      Surgical History:  Past Surgical History  Procedure Laterality Date  . Bi-ventricular implantable cardioverter defibrillator  (crt-d)  06/11/09    [employer] MED[employer] UNIFY [id number] BIVENTRICULAR AICD SERIAL #[id number]  . Knee arthroscopy Left ~ 1995  . Cardiac catheterization      "several time" (09/01/2012)  . Tee without cardioversion N/A 06/23/2012    Procedure: TRANSESOPHAGEAL ECHOCARDIOGRAM (TEE);  Surgeon: Vesta Mixer, MD;  Location: [facility] ENDOSCOPY;  Service: Cardiovascular;  Laterality: N/A;  TEE will be done at 0730   . Cardioversion N/A 07/27/2012    Procedure: CARDIOVERSION;  Surgeon: Vesta Mixer, MD;  Location: [facility] ENDOSCOPY;  Service: Cardiovascular;  Laterality: N/A;  . Tee without cardioversion N/A 08/10/2012    Procedure: TRANSESOPHAGEAL ECHOCARDIOGRAM (TEE);  Surgeon: Wendall Stade, MD;  Location: [facility] ENDOSCOPY;  Service: Cardiovascular;  Laterality: N/A;  . Cardioversion N/A 08/10/2012  Procedure: CARDIOVERSION;  Surgeon: Wendall Stade, MD;  Location: [facility] ENDOSCOPY;  Service: Cardiovascular;  Laterality: N/A;  . Sp pta add intra cran  11/2008    a. 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 Av node ablation N/A 01/09/2013    AVN ablation by Dr [name]  . Right heart catheterization N/A 02/15/2014    Procedure: RIGHT HEART CATH;  Surgeon: Dolores Patty, MD;  Location: [facility] CATH LAB;  Service: Cardiovascular;  Laterality: N/A;  . Picc line place peripheral ([facility] hx)       Prescriptions prior to admission  Medication Sig  Dispense Refill Last Dose  . acyclovir (ZOVIRAX) 800 MG tablet Take 400 mg by mouth daily.    05/13/2014 at Unknown time  . ALPRAZolam (XANAX) 0.25 MG tablet Take 1 tablet (0.25 mg total) by mouth at bedtime as needed for anxiety. 30 tablet 2 05/13/2014 at Unknown time  . amiodarone (PACERONE) 200 MG tablet Take 2 tablets (400 mg total) by mouth 2 (two) times daily. 120 tablet 3 05/13/2014 at Unknown time  . benzonatate (TESSALON) 100 MG capsule Take 1-2 capsules (100-200 mg total) by mouth 3 (three) times daily as needed for cough. 60 capsule 0 05/13/2014 at Unknown time  . cyclobenzaprine (FLEXERIL) 10 MG tablet Take 1 tablet (10 mg total) by mouth 2 (two) times daily. 20 tablet 0 05/13/2014 at Unknown time  . DM-Doxylamine-Acetaminophen (NYQUIL COLD & FLU PO) Take 30 mLs by mouth daily as needed (for cold).   Past Month at Unknown time  . ferrous sulfate 325 (65 FE) MG tablet Take 1 tablet (325 mg total) by mouth 2 (two) times daily with a meal. 60 tablet 3 05/13/2014 at Unknown time  . guaiFENesin-codeine (ROBITUSSIN AC) 100-10 MG/5ML syrup Take 5 mLs by mouth 4 (four) times daily as needed for cough. 180 mL 0 05/13/2014 at Unknown time  . levothyroxine (SYNTHROID, LEVOTHROID) 125 MCG tablet Take 2 tablets (250 mcg total) by mouth daily before breakfast. Additional refill should come from PCP 60 tablet 3 05/13/2014 at Unknown time  . magnesium oxide (MAG-OX) 400 (241.3 MG) MG tablet Take 1 tablet (400 mg total) by mouth daily. 30 tablet 6 05/13/2014 at Unknown time  . Menthol (RICOLA) LOZG Use as directed 1 lozenge in the mouth or throat daily as needed (for cough).   Past Month at Unknown time  . Milrinone Lactate 50 MG/50ML SOLN Inject 0.375 mcg/kg/min into the vein continuous. Dosing Weight 116.2 kg   04/28/2014 at Unknown time  . Potassium Chloride ER 20 MEQ TBCR Take 40 mEq by mouth 2 (two) times daily. 60 tablet 0 05/13/2014 at Unknown time  . ranolazine (RANEXA) 1000 MG SR tablet Take 1 tablet (1,000  mg total) by mouth 2 (two) times daily. 60 tablet 6 05/13/2014 at Unknown time  . senna-docusate (SENOKOT-S) 8.6-50 MG per tablet Take 2 tablets by mouth at bedtime. 60 tablet 1 05/13/2014 at Unknown time  . spironolactone (ALDACTONE) 25 MG tablet Take 1 tablet (25 mg total) by mouth 2 (two) times daily. 60 tablet 0 05/13/2014 at Unknown time  . torsemide (DEMADEX) 20 MG tablet Take 5 tablets (100 mg total) by mouth 2 (two) times daily. 240 tablet 3 05/13/2014 at Unknown time  . warfarin (COUMADIN) 7.5 MG tablet Take 7.5 mg by mouth daily at 6 PM.    05/13/2014 at Unknown time  . milrinone (PRIMACOR) 20 MG/100ML SOLN infusion Inject 45.225 mcg/min into the vein continuous. (  Patient not taking: Reported on 05/03/2014) 100 mL 11 Not Taking at Unknown time  . predniSONE (DELTASONE) 10 MG tablet Take 1 tablet (10 mg total) by mouth daily with breakfast. (Patient not taking: Reported on 05/08/2014) 2 tablet 0 Completed Course at Unknown time  . warfarin (COUMADIN) 5 MG tablet Take 1 tablet (5 mg total) by mouth daily at 6 PM. (Patient not taking: Reported on 05/05/2014) 30 tablet 0 Not Taking at Unknown time    Inpatient Medications:  . acyclovir  400 mg Oral Daily  . amiodarone  400 mg Oral BID  . cyclobenzaprine  10 mg Oral BID  . ferrous sulfate  325 mg Oral BID WC  . levothyroxine  250 mcg Oral QAC breakfast  . magnesium oxide  400 mg Oral Daily  . potassium chloride  40 mEq Oral Once  . potassium chloride  40 mEq Oral BID  . ranolazine  1,000 mg Oral BID  . senna-docusate  2 tablet Oral QHS  . sodium chloride  3 mL Intravenous Q12H  . spironolactone  25 mg Oral BID  . torsemide  100 mg Oral BID  . warfarin  10 mg Oral ONCE-1800  . Warfarin - Pharmacist Dosing Inpatient   Does not apply q1800    Allergies:  Allergies  Allergen Reactions  . Valsartan Cough    History   Social History  . Marital Status: Legally Separated    Spouse Name: N/A  . Number of Children: 0  . Years of Education:  N/A   Occupational History  . DISABLED    Social History Main Topics  . Smoking status: Never Smoker   . Smokeless tobacco: Never Used  . Alcohol Use: 0.0 oz/week    0 Standard drinks or equivalent per week     Comment: Rare  . Drug Use: No  . Sexual Activity: Not Currently   Other Topics Concern  . Not on file   Social History Narrative   Marital status: divorced since 2014; married x 16 years; dating x 4 years since CVA.      Children: none      Lives: Lives alone in house in [place].        Employment:  Disabled from CVA in 2011 (L sided weakness).      Tobacco: none      Alcohol: rarely      Drugs: none      Exercise:  Sporadically.      ADLs: drives; independent with ADLs; girlfriend does grocery shopping; ambulates with cane.      Seatbelt: 100%         Family History  Problem Relation Age of Onset  . Heart disease Father 55    AMI  . Diabetes Father   . Stroke Father 73    CVA x 2  . Diabetes Sister   . Atrial fibrillation Sister   . Hyperlipidemia Mother   . Hypertension Mother   . Heart disease Mother 67    CHF     Review of Systems: All other systems reviewed and are otherwise negative except as noted above.  Physical Exam: Filed Vitals:   04/20/2014 0530 04/20/2014 0600 05/15/2014 0656 04/26/2014 1320  BP: 114/77 123/78 113/81 123/82  Pulse: 72 72 69 70  Temp:   97.5 F (36.4 C) 97.4 F (36.3 C)  TempSrc:   Axillary Oral  Resp: 37 Height:    (1.753 m)   Weight:   264  lb 15.9 oz (120.2 kg)   SpO2: 95% 98% 98% 98%    GEN- The patient is chronically ill appearing, alert and oriented x 3 today.   HEENT: normocephalic, atraumatic; sclera clear, conjunctiva pink; hearing intact; oropharynx clear; neck supple, no JVP Lymph- no cervical lymphadenopathy Lungs- Clear to ausculation bilaterally, normal work of breathing.  Scattered rales Heart- Regular rate and rhythm, 2/6 SEM GI- soft, non-tender, non-distended, bowel sounds present    Extremities- no clubbing, cyanosis, or edema; DP/PT/radial pulses 2+ bilaterally MS- no significant deformity or atrophy Skin- warm and dry, no rash or lesion Psych- anxious Neuro- strength and sensation are intact  Labs:   Lab Results  Component Value Date   WBC 8.1 May 22, 2014   HGB 12.2* May 22, 2014   HCT 37.6* 05/22/2014   MCV 91.3 05-22-14   PLT 307 May 22, 2014     Recent Labs Lab 05-22-14 0143  NA 132*  K 3.6  CL 98  CO2 20  BUN 13  CREATININE 1.60*  CALCIUM 9.0  PROT 6.3  BILITOT 1.7*  ALKPHOS 109  ALT 19  AST 25  GLUCOSE 159*      Radiology/Studies: No results found.  EKG: atrial flutter with ventricular pacing, rate 81  TELEMETRY: atrial fibrillation with V pacing, frequent NSVT  DEVICE HISTORY: STJ CRTD implanted 05/2009  Assessment/Plan: 1.  Ventricular tachycardia The patient has had recurrent ventricular tachycardia requiring ICD shocks despite amiodarone 400mg  twice daily and ranexa. He has advanced heart failure and is not felt to be a candidate for advanced therapies.  He has multiple different VT cycle lengths as well as VF and is not a candidate for ablation with multiple co-morbidities.  Can try Mexilitine or Sotalol in addition to amiodarone for control of VT (will discuss with Dr Johney Frame).  Ultimately, his prognosis is poor.  No driving x6 months from last VT episode (pt aware)  2.  Non-ischemic cardiomyopathy  Continue medical therapy per AHF team  3.  Persistent atrial fibrillation Continue Warfarin for CHADS2VASC score of 4 He was in SR for a short time earlier this year but has reverted back to atrial flutter With LA of 58, likelihood of maintaining SR is low   Signed, Gypsy Balsam, NP 22-May-2014 2:05 PM  I have seen, examined the patient, and reviewed the above assessment and plan.  Changes to above are made where necessary.  Unfortunately, our options are very limited.  He has had polymorphic VT/ VF and is not an ablation  candidate.  I worry that milrinone may be contributing to arrhythmias however he likely requires this long term and may not do well on lower doses.  Also does not appear to be a beta blocker candidate presently.  Prognosis is very poor and ultimately palliative measures may be appropriate.  For now, will continue amiodarone 400mg  BID.  I will add mexiletine.  Co Sign: Hillis Range, MD 2014/05/22 9:44 PM

## 2014-05-14 NOTE — Progress Notes (Signed)
Pt admitted to unit from ER. Pt is on milirinone gtt. Pharmacy will send up bag to start pt using hospital medication and pump. Monitoring will continue.

## 2014-05-14 NOTE — Patient Outreach (Signed)
[facility]) Care Management  05/16/2014  Kristan Nilsson [birth year] [id number]   Patient is readmitted less than 30 days. Hospital Liaison notified. CSW notified.  Will continue to follow. Home visit planned for 4/28.  Rowe Pavy, RN, BSN, CEN [facility] [phone]

## 2014-05-14 NOTE — ED Provider Notes (Addendum)
CSN: [id number]     Arrival date & time 05/06/2014  0126 History  This chart was scribed for Azalia Bilis, MD by Annye Asa, ED Scribe. This patient was seen in room D32C/D32C and the patient's care was started at 2:03 AM.    Chief Complaint  Patient presents with  . Pacemaker Problem   The history is provided by the patient and the spouse. No language interpreter was used.     HPI Comments: Raymond [name] is a 54 y.o. male who presents to the Emergency Department for pacemaker problem. Patient reports he woke from sleep due to the firing of his pacemaker. Patient's wife reports that he was seen at the PCP 05/08/14; it fired twice in the office, once after the visit, once the next day, and again tonight. Patient explains that he was told by his home monitor to call EMS the next time it fired.   He is taking amiodarone.   Past Medical History  Diagnosis Date  . Non-ischemic cardiomyopathy     a. echo 11/10: EF 15%;   b. cath 10/08: normal cors;  c. Myoview 5/12: Very mild distal ant and apical isch, EF 17% (low risk-medical Rx cont'd);  d.  Echo 4/14:  EF 15%;  e. 05/2009 s/p SJM BiV ICD;  f. 07/2012 TEE: EF 20-25%, diff HK mild MR, mod TR.   Marland Kitchen Atrial fibrillation     a. on coumadin;  b. 07/2012 s/p TEE/DCCV. c. Recurrence 08/2012 in setting of abnormal thyroid panel.; 12/2012 AVN ablation by Dr [name]  . Hypertension   . Obesity   . Non-compliance   . Dyslexia     unable to read.  Marland Kitchen History of CVA (cerebrovascular accident) 11/2008    a. 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 RBBB (right bundle branch block)   . Cough syncope     a. During 08/2012 adm.  . Systolic CHF, chronic 01/19/1996  . Hypothyroidism 01/19/1996    s/p RAI therapy  . Stroke 01/19/2008  . Renal insufficiency   . Gout 04/23/2014    identified in hospital stay   Past Surgical History  Procedure Laterality Date  . Bi-ventricular implantable cardioverter defibrillator  (crt-d)  06/11/09    [facility]  MEDICAL UNIFY [id number] BIVENTRICULAR AICD SERIAL #[id number]  . Knee arthroscopy Left ~ 1995  . Cardiac catheterization      "several time" (09/01/2012)  . Tee without cardioversion N/A 06/23/2012    Procedure: TRANSESOPHAGEAL ECHOCARDIOGRAM (TEE);  Surgeon: Vesta Mixer, MD;  Location[facility] ENDOSCOPY;  Service: Cardiovascular;  Laterality: N/A;  TEE will be done at 0730   . Cardioversion N/A 07/27/2012    Procedure: CARDIOVERSION;  Surgeon: Vesta Mixer, MD;  Location: [facility] ENDOSCOPY;  Service: Cardiovascular;  Laterality: N/A;  . Tee without cardioversion N/A 08/10/2012    Procedure: TRANSESOPHAGEAL ECHOCARDIOGRAM (TEE);  Surgeon: Wendall Stade, MD;  Location: [facility] ENDOSCOPY;  Service: Cardiovascular;  Laterality: N/A;  . Cardioversion N/A 08/10/2012    Procedure: CARDIOVERSION;  Surgeon: Wendall Stade, MD;  Location: [facility] ENDOSCOPY;  Service: Cardiovascular;  Laterality: N/A;  . Sp pta add intra cran  11/2008    a. 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 Ablation  01/09/2013    AVN ablation by Dr [name]  . Av node ablation N/A 01/09/2013    Procedure: AV NODE ABLATION;  Surgeon: Marinus Maw, MD;  Location: [facility] CATH LAB;  Service: Cardiovascular;  Laterality: N/A;  . Right heart catheterization N/A 02/15/2014    Procedure: RIGHT HEART CATH;  Surgeon: Dolores Patty, MD;  Location: [facility] CATH LAB;  Service: Cardiovascular;  Laterality: N/A;   Family History  Problem Relation Age of Onset  . Heart disease Father 39    AMI  . Diabetes Father   . Stroke Father 45    CVA x 2  . Diabetes Sister   . Atrial fibrillation Sister   . Hyperlipidemia Mother   . Hypertension Mother   . Heart disease Mother 37    CHF   History  Substance Use Topics  . Smoking status: Never Smoker   . Smokeless tobacco: Never Used  . Alcohol Use: 0.0 oz/week    0 Standard drinks or equivalent per week     Comment: Rare    Review of Systems  A complete 10 system review of systems  was obtained and all systems are negative except as noted in the HPI and PMH.    Allergies  Valsartan  Home Medications   Prior to Admission medications   Medication Sig Start Date End Date Taking? Authorizing Provider  acyclovir (ZOVIRAX) 800 MG tablet Take 400 mg by mouth daily.     Historical Provider, MD  ALPRAZolam Prudy Feeler) 0.25 MG tablet Take 1 tablet (0.25 mg total) by mouth at bedtime as needed for anxiety. 05/08/14   Ethelda Chick, MD  amiodarone (PACERONE) 200 MG tablet Take 2 tablets (400 mg total) by mouth 2 (two) times daily. 05/09/14   Laurey Morale, MD  benzonatate (TESSALON) 100 MG capsule Take 1-2 capsules (100-200 mg total) by mouth 3 (three) times daily as needed for cough. 05/09/14   Ethelda Chick, MD  cyclobenzaprine (FLEXERIL) 10 MG tablet Take 1 tablet (10 mg total) by mouth 2 (two) times daily. 07/26/13   Pearline Cables, MD  DM-Doxylamine-Acetaminophen (NYQUIL COLD & FLU PO) Take 30 mLs by mouth daily as needed (for cold).    Historical Provider, MD  ferrous sulfate 325 (65 FE) MG tablet Take 1 tablet (325 mg total) by mouth 2 (two) times daily with a meal. 05/08/14   Amy D Clegg, NP  guaiFENesin-codeine (ROBITUSSIN AC) 100-10 MG/5ML syrup Take 5 mLs by mouth 4 (four) times daily as needed for cough. 05/11/14   Ethelda Chick, MD  levothyroxine (SYNTHROID, LEVOTHROID) 125 MCG tablet Take 2 tablets (250 mcg total) by mouth daily before breakfast. Additional refill should come from PCP 03/27/14   Amy D Clegg, NP  magnesium oxide (MAG-OX) 400 (241.3 MG) MG tablet Take 1 tablet (400 mg total) by mouth daily. 04/09/14   Ok Anis, NP  Menthol (RICOLA) LOZG Use as directed 1 lozenge in the mouth or throat daily as needed (for cough).    Historical Provider, MD  milrinone (PRIMACOR) 20 MG/100ML SOLN infusion Inject 45.225 mcg/min into the vein continuous. 04/19/14   Jacquelynn Cree, PA-C  Potassium Chloride ER 20 MEQ TBCR Take 40 mEq by mouth 2 (two) times daily. 05/03/14    Laurey Morale, MD  predniSONE (DELTASONE) 10 MG tablet Take 1 tablet (10 mg total) by mouth daily with breakfast. Patient not taking: Reported on 05/08/2014 04/19/14   Evlyn Kanner Love, PA-C  ranolazine (RANEXA) 1000 MG SR tablet Take 1 tablet (1,000 mg total) by mouth 2 (two) times daily. 04/09/14   Ok Anis, NP  senna-docusate (SENOKOT-S) 8.6-50 MG per tablet Take 2 tablets by mouth  at bedtime. 04/19/14   Jacquelynn Cree, PA-C  spironolactone (ALDACTONE) 25 MG tablet Take 1 tablet (25 mg total) by mouth 2 (two) times daily. 04/19/14   Jacquelynn Cree, PA-C  torsemide (DEMADEX) 20 MG tablet Take 5 tablets (100 mg total) by mouth 2 (two) times daily. 05/03/14   Laurey Morale, MD  warfarin (COUMADIN) 5 MG tablet Take 1 tablet (5 mg total) by mouth daily at 6 PM. 04/19/14   Jacquelynn Cree, PA-C  warfarin (COUMADIN) 7.5 MG tablet  02/22/14   Historical Provider, MD   BP 99/64 mmHg  Pulse 78  Temp(Src) 98.3 F (36.8 C) (Oral)  Resp 23  Ht  (1.753 m)  Wt 265 lb (120.203 kg)  BMI 39.12 kg/m2  SpO2 92% Physical Exam  Constitutional: He is oriented to person, place, and time. He appears well-developed and well-nourished.  HENT:  Head: Normocephalic and atraumatic.  Eyes: EOM are normal.  Neck: Normal range of motion.  Cardiovascular: Normal rate, regular rhythm, normal heart sounds and intact distal pulses.   Pulmonary/Chest: Effort normal and breath sounds normal. No respiratory distress.  Abdominal: Soft. He exhibits no distension. There is no tenderness.  Musculoskeletal: Normal range of motion.  PICC line in right upper extremity   Neurological: He is alert and oriented to person, place, and time.  Skin: Skin is warm and dry.  Psychiatric: He has a normal mood and affect. Judgment normal.  Nursing note and vitals reviewed.   ED Course  Procedures   CRITICAL CARE Performed by: Lyanne Co Total critical care time: 32 Critical care time was exclusive of separately billable  procedures and treating other patients. Critical care was necessary to treat or prevent imminent or life-threatening deterioration. Critical care was time spent personally by me on the following activities: development of treatment plan with patient and/or surrogate as well as nursing, discussions with consultants, evaluation of patient's response to treatment, examination of patient, obtaining history from patient or surrogate, ordering and performing treatments and interventions, ordering and review of laboratory studies, ordering and review of radiographic studies, pulse oximetry and re-evaluation of patient's condition.   DIAGNOSTIC STUDIES: Oxygen Saturation is 96% on RA, adequate by my interpretation.    COORDINATION OF CARE: 2:06 AM Discussed treatment plan with pt at bedside and pt agreed to plan.   Labs Review Labs Reviewed  CBC WITH DIFFERENTIAL/PLATELET - Abnormal; Notable for the following:    RBC 4.12 (*)    Hemoglobin 12.2 (*)    HCT 37.6 (*)    RDW 16.5 (*)    Neutrophils Relative % 84 (*)    Lymphocytes Relative 7 (*)    Lymphs Abs 0.6 (*)    All other components within normal limits  COMPREHENSIVE METABOLIC PANEL - Abnormal; Notable for the following:    Sodium 132 (*)    Glucose, Bld 159 (*)    Creatinine, Ser 1.60 (*)    Albumin 3.0 (*)    Total Bilirubin 1.7 (*)    GFR calc non Af Amer 48 (*)    GFR calc Af Amer 56 (*)    All other components within normal limits  TSH - Abnormal; Notable for the following:    TSH 0.131 (*)    All other components within normal limits  PROTIME-INR - Abnormal; Notable for the following:    Prothrombin Time 18.7 (*)    INR 1.54 (*)    All other components within normal limits  MAGNESIUM  PHOSPHORUS  Rosezena Sensor, ED    Imaging Review No results found.   EKG Interpretation   Date/Time:  Tuesday 05/29/14 01:32:32 EDT Ventricular Rate:  81 PR Interval:  40 QRS Duration: 135 QT Interval:  419 QTC  Calculation: 486 R Axis:   -109 Text Interpretation:  Ventricular-paced complexes No further analysis  attempted due to paced rhythm No significant change was found Confirmed by  Jackalynn Art  MD, Caryn Bee ([id number]) on 05-29-2014 6:15:58 AM      MDM   Final diagnoses:  Ventricular tachycardia  Defibrillator discharge   3:35 AM Spoke with Dr Tresa Endo, cardiology who will admit. 27 beat monomorphic v tach while in ER with appropriate AICD discharge.   6:06 AM Patient with second discharge while here in the emergency department.  Again for DVT.  Patient be admitted by cardiology.  Will defer additional medication management to cardiology at this time.   I personally performed the services described in this documentation, which was scribed in my presence. The recorded information has been reviewed and is accurate.        Azalia Bilis, MD 05-29-2014 [id number]  Azalia Bilis, MD 29-May-2014 [phone]

## 2014-05-14 NOTE — Progress Notes (Signed)
Patient had run v tach and defibrillator went off. Patient did not feel it and was asymptomatic. bp was 130/70. No complaints at this time. Will continue to monitor.  Amber NP on floor and made aware.

## 2014-05-14 NOTE — Consult Note (Signed)
   [facility] CM Inpatient Consult   04/24/2014  Eris Halteman 1960/09/07 [id number]   Notified by [facility] RNCM of hospital admission. Went to bedside to speak with patient as he is active with [facility]. At bedside, he states " I am trying to rest". He reports he does not want to be disturbed at this time. Made inpatient RNCM aware that patient is followed by [facility]. [facility] Care Management will continue to follow as patient allows.  Raiford Noble, MSN-Ed, RN,BSN [facility] Liaison [phone]

## 2014-05-14 NOTE — Progress Notes (Signed)
ANTICOAGULATION CONSULT NOTE - Initial Consult  Pharmacy Consult for Coumadin Indication: atrial fibrillation  Allergies  Allergen Reactions  . Valsartan Cough    Patient Measurements: Height: 5\' 9"  (175.3 cm) Weight: 265 lb (120.203 kg) IBW/kg (Calculated) : 70.7  Vital Signs: Temp: 98.3 F (36.8 C) (04/26 0133) Temp Source: Oral (04/26 0133) BP: 123/78 mmHg (04/26 0600) Pulse Rate: 72 (04/26 0600)  Labs:  Recent Labs  04/27/2014 0143  HGB 12.2*  HCT 37.6*  PLT 307  LABPROT 18.7*  INR 1.54*  CREATININE 1.60*    Estimated Creatinine Clearance: 69.9 mL/min (by C-G formula based on Cr of 1.6).   Medical History: Past Medical History  Diagnosis Date  . Non-ischemic cardiomyopathy     a. echo 11/10: EF 15%;   b. cath 10/08: normal cors;  c. Myoview 5/12: Very mild distal ant and apical isch, EF 17% (low risk-medical Rx cont'd);  d.  Echo 4/14:  EF 15%;  e. 05/2009 s/p SJM BiV ICD;  f. 07/2012 TEE: EF 20-25%, diff HK mild MR, mod TR.   Marland Kitchen Atrial fibrillation     a. on coumadin;  b. 07/2012 s/p TEE/DCCV. c. Recurrence 08/2012 in setting of abnormal thyroid panel.; 12/2012 AVN ablation by Dr [name]  . Hypertension   . Obesity   . Non-compliance   . Dyslexia     unable to read.  Marland Kitchen History of CVA (cerebrovascular accident) 11/2008    a. 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 RBBB (right bundle branch block)   . Cough syncope     a. During 08/2012 adm.  . Systolic CHF, chronic 01/19/1996  . Hypothyroidism 01/19/1996    s/p RAI therapy  . Stroke 01/19/2008  . Renal insufficiency   . Gout 04/23/2014    identified in hospital stay    Assessment: 54yo male c/o SOB/cough and ICD firing for VT, has h/o severe heart failure but not candidate for LVAD/transplant, to continue Coumadin for Afib; current INR below goal w/ last Coumadin dose taken 4/25; of note pt is supposed to be taking 7.5mg  TTSun and 3.375mg  MWFSat but he has been taking 5mg  daily to finish the Rx he  has which results in a 2.5mg /wk deficit, outpt anti-coag notes also mention pt taking Coumadin differently than prescribed at other encounters.  Goal of Therapy:  INR 2-3   Plan:  Will give boosted dose of Coumadin 10mg  today and monitor INR for dose adjustments; if possible attempt to make Coumadin regimen less complex at discharge to ensure he takes properly.  Vernard Gambles, PharmD, BCPS  05/08/2014,6:38 AM

## 2014-05-14 NOTE — ED Notes (Signed)
Pt arrives via EMS from home. States that he was awoken by his pacemaker/AICD firing at 2330.

## 2014-05-14 NOTE — Progress Notes (Signed)
[facility]  Patient Status: Active pt with [facility] prior to this readmission  [facility] is providing the following services: HHRN, PT, HH Aide, Home [facility]fusion [facility] for home Milrinone.  [facility] hospital team will follow [name] while inpatient and support DC plan when ordered.   If patient discharges after hours, please call [phone].   Sedalia Muta 05/04/2014, 8:33 AM

## 2014-05-14 NOTE — ED Notes (Signed)
Denies chest pain 

## 2014-05-14 NOTE — H&P (Signed)
Raymond [name] is an 54 y.o. male.    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 BIVICD, CKD, hypothyroidism on home milrinone and recurrent ICD shocks for VT who presents with cough and another ICD shock today. He has had 2 ICD shocks in the ER. He has been told he is not an LVAD/transplant candidate. He is accompanied by his long-time girlfriend. He was admitted back in January with cardiogenic shock requiring two pressors for diuresis and March/early April for hypokalemia and VT/ICD shocks with last known discharge weight 275 lbs seen in clinic 05/03/14 with upcoming appointment this Friday. He remains on amiodarone for VT suppression. We spoke at length about how sick he and his heart were and that his potassium is largely normal now so his VT is likely related to scar and progression of his heart failure. No recent travel or sick contacts. He has PND/orthopnea (3 pillows) that is stable. He woke up at 11 pm with his first ICD today leading to presentation. He very much would like an LVAD or Transplant but I discussed that he is not a good candidate based on prior consideration here.    Past Medical History  Diagnosis Date  . Non-ischemic cardiomyopathy     a. echo 11/10: EF 15%;   b. cath 10/08: normal cors;  c. Myoview 5/12: Very mild distal ant and apical isch, EF 17% (low risk-medical Rx cont'd);  d.  Echo 4/14:  EF 15%;  e. 05/2009 s/p SJM BiV ICD;  f. 07/2012 TEE: EF 20-25%, diff HK mild MR, mod TR.   Marland Kitchen Atrial fibrillation     a. on coumadin;  b. 07/2012 s/p TEE/DCCV. c. Recurrence 08/2012 in setting of abnormal thyroid panel.; 12/2012 AVN ablation by Dr Lovena Le  . Hypertension   . Obesity   . Non-compliance   . Dyslexia     unable to read.  Marland Kitchen History of CVA (cerebrovascular accident) 11/2008    a. right brain CVA  11/10 tx with tPA-> PTA and stenting  . RBBB (right bundle branch block)   . Cough syncope     a. During 08/2012 adm.  . Systolic CHF, chronic 07/27/4799  . Hypothyroidism 01/19/1996    s/p RAI therapy  . Stroke 01/19/2008  . Renal insufficiency   . Gout 04/23/2014    identified in hospital stay    Past Surgical History  Procedure Laterality Date  . Bi-ventricular implantable cardioverter defibrillator  (crt-d)  06/11/09    [place] UNIFY [id number] BIVENTRICULAR AICD SERIAL #[id number]  . Knee arthroscopy Left ~ 1995  . Cardiac catheterization      "several time" (09/01/2012)  . Tee without cardioversion N/A 06/23/2012    Procedure: TRANSESOPHAGEAL ECHOCARDIOGRAM (TEE);  Surgeon: Thayer Headings, MD;  Location[facility] ENDOSCOPY;  Service: Cardiovascular;  Laterality: N/A;  TEE will be done at 0730   . Cardioversion N/A 07/27/2012    Procedure: CARDIOVERSION;  Surgeon: Thayer Headings, MD;  Location: [facility] ENDOSCOPY;  Service: Cardiovascular;  Laterality: N/A;  . Tee without cardioversion N/A 08/10/2012    Procedure: TRANSESOPHAGEAL ECHOCARDIOGRAM (TEE);  Surgeon: Josue Hector, MD;  Location: [facility] ENDOSCOPY;  Service: Cardiovascular;  Laterality: N/A;  . Cardioversion N/A 08/10/2012    Procedure: CARDIOVERSION;  Surgeon: Josue Hector, MD;  Location: [facility];  Service: Cardiovascular;  Laterality: N/A;  . Sp pta add intra cran  11/2008    a. 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 Ablation  01/09/2013    AVN ablation by Dr Lovena Le  . Av node ablation N/A 01/09/2013    Procedure: AV NODE ABLATION;  Surgeon: Evans Lance, MD;  Location: [facility] CATH LAB;  Service: Cardiovascular;  Laterality: N/A;  . Right heart catheterization N/A 02/15/2014    Procedure: RIGHT HEART CATH;  Surgeon: Jolaine Artist, MD;  Location: [facility] CATH LAB;  Service: Cardiovascular;  Laterality: N/A;    Family History  Problem Relation Age of Onset  . Heart disease Father 67    AMI  .  Diabetes Father   . Stroke Father 44    CVA x 2  . Diabetes Sister   . Atrial fibrillation Sister   . Hyperlipidemia Mother   . Hypertension Mother   . Heart disease Mother 42    CHF   Social History:  reports that he has never smoked. He has never used smokeless tobacco. He reports that he drinks alcohol. He reports that he does not use illicit drugs.  Allergies:  Allergies  Allergen Reactions  . Valsartan Cough     (Not in a hospital admission)  Results for orders placed or performed during the hospital encounter of 04/27/2014 (from the past 48 hour(s))  CBC with Differential/Platelet     Status: Abnormal   Collection Time: 04/29/2014  1:43 AM  Result Value Ref Range   WBC 8.1 4.0 - 10.5 K/uL   RBC 4.12 (L) 4.22 - 5.81 MIL/uL   Hemoglobin 12.2 (L) 13.0 - 17.0 g/dL   HCT 37.6 (L) 39.0 - 52.0 %   MCV 91.3 78.0 - 100.0 fL   MCH 29.6 26.0 - 34.0 pg   MCHC 32.4 30.0 - 36.0 g/dL   RDW 16.5 (H) 11.5 - 15.5 %   Platelets 307 150 - 400 K/uL   Neutrophils Relative % 84 (H) 43 - 77 %   Neutro Abs 6.9 1.7 - 7.7 K/uL   Lymphocytes Relative 7 (L) 12 - 46 %   Lymphs Abs 0.6 (L) 0.7 - 4.0 K/uL   Monocytes Relative 7 3 - 12 %   Monocytes Absolute 0.6 0.1 - 1.0 K/uL   Eosinophils Relative 1 0 - 5 %   Eosinophils Absolute 0.1 0.0 - 0.7 K/uL   Basophils Relative 1 0 - 1 %   Basophils Absolute 0.0 0.0 - 0.1 K/uL  Comprehensive metabolic panel     Status: Abnormal   Collection Time: 04/28/2014  1:43 AM  Result Value Ref Range   Sodium 132 (L) 135 - 145 mmol/L   Potassium 3.6 3.5 - 5.1 mmol/L   Chloride 98 96 - 112 mmol/L   CO2 20 19 - 32 mmol/L   Glucose, Bld 159 (H) 70 - 99 mg/dL   BUN 13 6 - 23 mg/dL   Creatinine, Ser 1.60 (H) 0.50 - 1.35 mg/dL   Calcium 9.0 8.4 - 10.5 mg/dL   Total Protein 6.3 6.0 - 8.3 g/dL   Albumin 3.0 (L) 3.5 - 5.2 g/dL   AST 25 0 - 37 U/L   ALT 19 0 - 53 U/L   Alkaline Phosphatase 109 39 - 117 U/L   Total Bilirubin 1.7 (H) 0.3 - 1.2 mg/dL   GFR calc non Af  Amer 48 (L) >90 mL/min   GFR calc Af Amer 56 (L) >90 mL/min  Comment: (NOTE) The eGFR has been calculated using the CKD EPI equation. This calculation has not been validated in all clinical situations. eGFR's persistently <90 mL/min signify possible Chronic Kidney Disease.    Anion gap 14 5 - 15  TSH     Status: Abnormal   Collection Time: 04/30/2014  1:43 AM  Result Value Ref Range   TSH 0.131 (L) 0.350 - 4.500 uIU/mL  Protime-INR     Status: Abnormal   Collection Time: 05/04/2014  1:43 AM  Result Value Ref Range   Prothrombin Time 18.7 (H) 11.6 - 15.2 seconds   INR 1.54 (H) 0.00 - 1.49  Magnesium     Status: None   Collection Time: 05/04/2014  1:43 AM  Result Value Ref Range   Magnesium 1.9 1.5 - 2.5 mg/dL  Phosphorus     Status: None   Collection Time: 05/16/2014  1:43 AM  Result Value Ref Range   Phosphorus 3.0 2.3 - 4.6 mg/dL  I-stat troponin, ED     Status: None   Collection Time: 04/23/2014  1:48 AM  Result Value Ref Range   Troponin i, poc 0.08 0.00 - 0.08 ng/mL   Comment NOTIFIED PHYSICIAN    Comment 3            Comment: Due to the release kinetics of cTnI, a negative result within the first hours of the onset of symptoms does not rule out myocardial infarction with certainty. If myocardial infarction is still suspected, repeat the test at appropriate intervals.    No results found.  Review of Systems  Constitutional: Positive for weight loss and malaise/fatigue. Negative for fever and chills.       Weight loss and weight gain  HENT: Negative for ear pain.   Eyes: Negative for double vision and photophobia.  Respiratory: Positive for cough and shortness of breath. Negative for hemoptysis.   Cardiovascular: Positive for chest pain, orthopnea, leg swelling and PND.  Gastrointestinal: Positive for nausea. Negative for diarrhea.  Genitourinary: Negative for dysuria and hematuria.  Musculoskeletal: Negative for myalgias.  Skin: Negative for rash.  Neurological:  Positive for dizziness. Negative for tremors, sensory change and headaches.  Endo/Heme/Allergies: Bruises/bleeds easily.  Psychiatric/Behavioral: Negative for depression, suicidal ideas, hallucinations and substance abuse.    Blood pressure 126/73, pulse 68, temperature 98.3 F (36.8 C), temperature source Oral, resp. rate 32, height 5' 9"  (1.753 m), weight 120.203 kg (265 lb), SpO2 99 %. Physical Exam  Nursing note and vitals reviewed. Constitutional: He is oriented to person, place, and time. He appears distressed.  Appears older than stated age, mildly anxious and depressed  HENT:  Nose: Nose normal.  Mouth/Throat: Oropharynx is clear and moist. No oropharyngeal exudate.  Eyes: Conjunctivae and EOM are normal. Pupils are equal, round, and reactive to light. No scleral icterus.  Neck: Normal range of motion. JVD present. No tracheal deviation present.  jvp to earlobe  Cardiovascular: Normal rate, regular rhythm and intact distal pulses.   Murmur heard. Systolic murmur heard at lsb  Respiratory: He is in respiratory distress. He has no wheezes. He has rales.  Scattered rales at bases bilaterally some clearing superiorly  GI: Soft. Bowel sounds are normal. He exhibits no distension. There is no tenderness. There is no rebound.  Musculoskeletal: He exhibits edema.  Trace to 1+ edema bilaterally, lukewarm extremities  Neurological: He is alert and oriented to person, place, and time. No cranial nerve deficit. Coordination normal.  Skin: Skin is warm and dry. No rash  noted. He is not diaphoretic. No erythema.  Psychiatric: He has a normal mood and affect. His behavior is normal.   ECHO 05/02/13 EF 10%  ECHO 1/16: EF 10-15%, diffuse hypokinesis, RV moderately dilated and mildly decreased function, PA systolic pressure 64 mmHg.   Assessment/Plan Raymond [name] is a 54 yo man with multiple medical issues and end-stage cardiomyopathy here with recurrent ICD shocks on home milrinone.  Problem  List Ventricular Tachycardia requiring ICD shocks End-stage cardiomyopathy - acute on chronic systolic heart failure Acute renal failure on chronic kidney disease Chronic atrial fibrillatin on amiodarone and ranolazine  Hypokalemia (K 3.6) Coughing Hypothyroidism  Plan 1. Replete potassium to K > 4 2. Follow-up Device Interrogation 3. Continue home HF medications - milrinone and Torsemide 100 mg bid 4. Pharmacy consult for warfarin  5. Continue amiodarone and ranolazine 6. Goals of care discussion to be continued by day team  Soniyah Mcglory 05/18/2014, 5:34 AM

## 2014-05-14 NOTE — ED Notes (Addendum)
Pt requesting "anxiety pill"; states he is "feeling very anxious"

## 2014-05-15 DIAGNOSIS — I5023 Acute on chronic systolic (congestive) heart failure: Secondary | ICD-10-CM | POA: Diagnosis present

## 2014-05-15 DIAGNOSIS — R0602 Shortness of breath: Secondary | ICD-10-CM | POA: Diagnosis present

## 2014-05-15 DIAGNOSIS — E039 Hypothyroidism, unspecified: Secondary | ICD-10-CM | POA: Diagnosis present

## 2014-05-15 DIAGNOSIS — F419 Anxiety disorder, unspecified: Secondary | ICD-10-CM | POA: Diagnosis present

## 2014-05-15 DIAGNOSIS — M109 Gout, unspecified: Secondary | ICD-10-CM | POA: Diagnosis present

## 2014-05-15 DIAGNOSIS — Z66 Do not resuscitate: Secondary | ICD-10-CM | POA: Diagnosis present

## 2014-05-15 DIAGNOSIS — I5022 Chronic systolic (congestive) heart failure: Secondary | ICD-10-CM | POA: Diagnosis not present

## 2014-05-15 DIAGNOSIS — E876 Hypokalemia: Secondary | ICD-10-CM | POA: Diagnosis present

## 2014-05-15 DIAGNOSIS — N183 Chronic kidney disease, stage 3 (moderate): Secondary | ICD-10-CM | POA: Diagnosis present

## 2014-05-15 DIAGNOSIS — I482 Chronic atrial fibrillation: Secondary | ICD-10-CM | POA: Diagnosis present

## 2014-05-15 DIAGNOSIS — I472 Ventricular tachycardia: Secondary | ICD-10-CM | POA: Diagnosis present

## 2014-05-15 DIAGNOSIS — R0600 Dyspnea, unspecified: Secondary | ICD-10-CM | POA: Diagnosis not present

## 2014-05-15 DIAGNOSIS — N189 Chronic kidney disease, unspecified: Secondary | ICD-10-CM | POA: Diagnosis not present

## 2014-05-15 DIAGNOSIS — Z7901 Long term (current) use of anticoagulants: Secondary | ICD-10-CM | POA: Diagnosis not present

## 2014-05-15 DIAGNOSIS — I130 Hypertensive heart and chronic kidney disease with heart failure and stage 1 through stage 4 chronic kidney disease, or unspecified chronic kidney disease: Secondary | ICD-10-CM | POA: Diagnosis present

## 2014-05-15 DIAGNOSIS — I429 Cardiomyopathy, unspecified: Secondary | ICD-10-CM | POA: Diagnosis present

## 2014-05-15 DIAGNOSIS — R570 Cardiogenic shock: Secondary | ICD-10-CM | POA: Diagnosis present

## 2014-05-15 DIAGNOSIS — M549 Dorsalgia, unspecified: Secondary | ICD-10-CM | POA: Diagnosis not present

## 2014-05-15 DIAGNOSIS — Z6841 Body Mass Index (BMI) 40.0 and over, adult: Secondary | ICD-10-CM | POA: Diagnosis not present

## 2014-05-15 DIAGNOSIS — N179 Acute kidney failure, unspecified: Secondary | ICD-10-CM | POA: Diagnosis present

## 2014-05-15 DIAGNOSIS — Z95810 Presence of automatic (implantable) cardiac defibrillator: Secondary | ICD-10-CM | POA: Diagnosis not present

## 2014-05-15 DIAGNOSIS — I481 Persistent atrial fibrillation: Secondary | ICD-10-CM | POA: Diagnosis present

## 2014-05-15 DIAGNOSIS — E669 Obesity, unspecified: Secondary | ICD-10-CM | POA: Diagnosis present

## 2014-05-15 DIAGNOSIS — I4892 Unspecified atrial flutter: Secondary | ICD-10-CM | POA: Diagnosis present

## 2014-05-15 DIAGNOSIS — I69354 Hemiplegia and hemiparesis following cerebral infarction affecting left non-dominant side: Secondary | ICD-10-CM | POA: Diagnosis not present

## 2014-05-15 DIAGNOSIS — Z515 Encounter for palliative care: Secondary | ICD-10-CM | POA: Diagnosis not present

## 2014-05-15 LAB — BASIC METABOLIC PANEL
ANION GAP: 7 (ref 5–15)
Anion gap: 17 — ABNORMAL HIGH (ref 5–15)
BUN: 11 mg/dL (ref 6–23)
BUN: 12 mg/dL (ref 6–23)
CALCIUM: 7.8 mg/dL — AB (ref 8.4–10.5)
CHLORIDE: 96 mmol/L (ref 96–112)
CO2: 25 mmol/L (ref 19–32)
CO2: 30 mmol/L (ref 19–32)
CREATININE: 1.61 mg/dL — AB (ref 0.50–1.35)
Calcium: 8.7 mg/dL (ref 8.4–10.5)
Chloride: 95 mmol/L — ABNORMAL LOW (ref 96–112)
Creatinine, Ser: 1.61 mg/dL — ABNORMAL HIGH (ref 0.50–1.35)
GFR calc Af Amer: 56 mL/min — ABNORMAL LOW (ref >90)
GFR calc Af Amer: 56 mL/min — ABNORMAL LOW (ref >90)
GFR calc non Af Amer: 48 mL/min — ABNORMAL LOW (ref >90)
GFR calc non Af Amer: 48 mL/min — ABNORMAL LOW (ref >90)
Glucose, Bld: 125 mg/dL — ABNORMAL HIGH (ref 70–99)
Glucose, Bld: 140 mg/dL — ABNORMAL HIGH (ref 70–99)
POTASSIUM: 3.9 mmol/L (ref 3.5–5.1)
Potassium: 3 mmol/L — ABNORMAL LOW (ref 3.5–5.1)
Sodium: 137 mmol/L (ref 135–145)
Sodium: 133 mmol/L — ABNORMAL LOW (ref 135–145)

## 2014-05-15 LAB — MAGNESIUM: MAGNESIUM: 2.4 mg/dL (ref 1.5–2.5)

## 2014-05-15 LAB — GLUCOSE, CAPILLARY: Glucose-Capillary: 145 mg/dL — ABNORMAL HIGH (ref 70–99)

## 2014-05-15 LAB — PROTIME-INR
INR: 2.3 — ABNORMAL HIGH (ref 0.00–1.49)
Prothrombin Time: 25.5 seconds — ABNORMAL HIGH (ref 11.6–15.2)

## 2014-05-15 MED ORDER — POTASSIUM CHLORIDE ER 10 MEQ PO TBCR
40.0000 meq | EXTENDED_RELEASE_TABLET | Freq: Three times a day (TID) | ORAL | Status: DC
  Administered 2014-05-15 – 2014-05-16 (×4): 40 meq via ORAL
  Filled 2014-05-15 (×6): qty 4

## 2014-05-15 MED ORDER — ALTEPLASE 2 MG IJ SOLR
2.0000 mg | Freq: Once | INTRAMUSCULAR | Status: AC
  Administered 2014-05-15: 2 mg
  Filled 2014-05-15: qty 2

## 2014-05-15 MED ORDER — WARFARIN SODIUM 5 MG PO TABS
5.0000 mg | ORAL_TABLET | Freq: Once | ORAL | Status: AC
  Administered 2014-05-15: 5 mg via ORAL
  Filled 2014-05-15 (×2): qty 1

## 2014-05-15 MED ORDER — MAGNESIUM SULFATE 2 GM/50ML IV SOLN
2.0000 g | Freq: Once | INTRAVENOUS | Status: AC
  Administered 2014-05-15: 2 g via INTRAVENOUS
  Filled 2014-05-15: qty 50

## 2014-05-15 NOTE — Progress Notes (Signed)
Advanced Heart Failure Rounding Note   Subjective:    Admitted after multiple ICD shocks. Yesterday he received 2 grams Mag. Last night had polymorphic VT with ICD shock. K this am 3.0. Mag was 1.7 but received 2g mag sulfate yesterday. Denies SOB.   EP following. Mexilitene added  Objective:   Weight Range:  Vital Signs:   Temp:  [97.4 F (36.3 C)-98.2 F (36.8 C)] 98.2 F (36.8 C) (04/27 0423) Pulse Rate:  [65-72] 65 (04/27 0423) Resp:  [22] 22 (04/27 0423) BP: (112-134)/(74-84) 134/83 mmHg (04/27 0423) SpO2:  [96 %-98 %] 98 % (04/27 0423) Weight:  [262 lb 2 oz (118.9 kg)] 262 lb 2 oz (118.9 kg) (04/27 0415)    Weight change: Filed Weights   05/15/2014 0133 05/13/2014 0656 05/15/14 0415  Weight: 265 lb (120.203 kg) 264 lb 15.9 oz (120.2 kg) 262 lb 2 oz (118.9 kg)    Intake/Output:   Intake/Output Summary (Last 24 hours) at 05/15/14 0820 Last data filed at 05/15/14 0547  Gross per 24 hour  Intake  793.1 ml  Output   3900 ml  Net -3106.9 ml     Physical Exam: General:  Fatigued appearing. No resp difficulty HEENT: normal Neck: supple. JVP 7-8. Carotids 2+ bilat; no bruits. No lymphadenopathy or thryomegaly appreciated. Cor: PMI laterally displaced. Regular rate & rhythm. +s3 Lungs: clear Abdomen: soft, obese nontender, nondistended. No hepatosplenomegaly. No bruits or masses. Good bowel sounds. Extremities: no cyanosis, clubbing, rash, tr edema Neuro: alert & orientedx3, cranial nerves grossly intact. Weak on left side . Affect pleasant  Telemetry:  NSR with episode polymorphic VT treated with ICD shock.   Labs: Basic Metabolic Panel:  Recent Labs Lab 05/08/14 1106 04/28/2014 0143 05/15/14 0538  NA 136 132* 137  K 3.9 3.6 3.0*  CL 98 98 95*  CO2 GLUCOSE 136* 159* 125*  BUN CREATININE 1.34 1.60* 1.61*  CALCIUM 9.2 9.0 7.8*  MG  --  1.9  --   PHOS  --  3.0  --     Liver Function Tests:  Recent Labs Lab 05/08/14 1106  04/24/2014 0143  AST 23 25  ALT 17 19  ALKPHOS 97 109  BILITOT 1.3* 1.7*  PROT 6.4 6.3  ALBUMIN 3.4* 3.0*   No results for input(s): LIPASE, AMYLASE in the last 168 hours. No results for input(s): AMMONIA in the last 168 hours.  CBC:  Recent Labs Lab 05/08/14 1131 05/15/2014 0143  WBC 7.6 8.1  NEUTROABS  --  6.9  HGB 12.7* 12.2*  HCT 38.8* 37.6*  MCV 90.9 91.3  PLT  --  307    Cardiac Enzymes: No results for input(s): CKTOTAL, CKMB, CKMBINDEX, TROPONINI in the last 168 hours.  BNP: BNP (last 3 results)  Recent Labs  03/28/14 1827 03/30/14 1010 05/16/2014 0820  BNP 590.5* 484.2* 1058.3*    ProBNP (last 3 results)  Recent Labs  12/26/13 1205 05/03/14 1035  PROBNP 4324.0* 629.0*      Other results:  Imaging:  No results found.   Medications:     Scheduled Medications: . acyclovir  400 mg Oral Daily  . amiodarone  400 mg Oral BID  . cyclobenzaprine  10 mg Oral BID  . ferrous sulfate  325 mg Oral BID WC  . levothyroxine  250 mcg Oral QAC breakfast  . magnesium oxide  400 mg Oral Daily  . mexiletine  200 mg Oral 3 times per day  .  potassium chloride  40 mEq Oral Once  . potassium chloride  40 mEq Oral BID  . ranolazine  1,000 mg Oral BID  . senna-docusate  2 tablet Oral QHS  . sodium chloride  3 mL Intravenous Q12H  . spironolactone  25 mg Oral BID  . torsemide  100 mg Oral BID  . Warfarin - Pharmacist Dosing Inpatient   Does not apply q1800     Infusions: . milrinone 0.375 mcg/kg/min (05-15-2014 2302)     PRN Medications:  sodium chloride, acetaminophen, ALPRAZolam, benzonatate, guaiFENesin-codeine, menthol-cetylpyridinium, ondansetron (ZOFRAN) IV, sodium chloride, sodium chloride   Assessment:  1. VT- multiple episodes. [employer] Device  2. Hypokalemia/Hypomagnesemia 3. Afib 4. 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 Chronic A fib- on coumadin. CHADS2VASC score of 4   Plan/Discussion:    Had  VT again last night. Supplement potassium. Give 2 grams Magnesium now. Per EP not a candidate for ablation. Continue amiodarone 400 mg twice a day + mexiliene.   Remains on milrinone 0.375 mcg for end stage heart failure which is not helping with ongoing VT. Continue current HF regimen. Follow electrolytes closely.   Will need to discuss goals of care.   Length of Stay:   CLEGG,AMY 05/15/2014, 8:20 AM  Advanced Heart Failure Team Pager [phone] (M-F; 7a - 4p)  Please contact [facility] for night-coverage after hours (4p -7a ) and weekends on [email or web address]  Patient seen and examined with Tonye Becket, NP. We discussed all aspects of the encounter. I agree with the assessment and plan as stated above.    I agree with Dr. Johney Frame that milrinone likely exacerbating VT but he has end-stage HF and does poorly without inotropic support. Not VAD candidate due to social issues and noncompliance. Mexilitene added to amio and ranexa. Will supp lytes aggressively and hopefully VT will quiet down. We discussed Hospice care and he is not interested in this approach although his family has supported the idea.  Truman Hayward 8:36 AM

## 2014-05-15 NOTE — Progress Notes (Signed)
Patient has long runs of Ventricular Tachycardia (51 beats)ICD fired per patient,VS checked BP-134/84.O2 saturation at 96% on room air,patient is asymptomatic.Dr. Terressa Koyanagi cardiology on call made aware,without order made,will continue to monitor.

## 2014-05-15 NOTE — Progress Notes (Signed)
ANTICOAGULATION CONSULT NOTE   Pharmacy Consult for Coumadin Indication: atrial fibrillation  Allergies  Allergen Reactions  . Valsartan Cough    Patient Measurements: Height: 5\' 9"  (175.3 cm) Weight: 262 lb 2 oz (118.9 kg) IBW/kg (Calculated) : 70.7  Vital Signs: Temp: 98.2 F (36.8 C) (04/27 0423) Temp Source: Axillary (04/27 0423) BP: 134/83 mmHg (04/27 0423) Pulse Rate: 65 (04/27 0423)  Labs:  Recent Labs  05-17-14 0143 05-17-14 0820 05/15/14 0538  HGB 12.2*  --   --   HCT 37.6*  --   --   PLT 307  --   --   LABPROT 18.7* 20.4* 25.5*  INR 1.54* 1.73* 2.30*  CREATININE 1.60*  --  1.61*    Estimated Creatinine Clearance: 69.1 mL/min (by C-G formula based on Cr of 1.61).   Medical History: Past Medical History  Diagnosis Date  . Non-ischemic cardiomyopathy     a. echo 11/10: EF 15%;   b. cath 10/08: normal cors;  c. Myoview 5/12: Very mild distal ant and apical isch, EF 17% (low risk-medical Rx cont'd);  d.  Echo 4/14:  EF 15%;  e. 05/2009 s/p SJM BiV ICD;  f. 07/2012 TEE: EF 20-25%, diff HK mild MR, mod TR.   Marland Kitchen Atrial fibrillation     a. on coumadin;  b. 07/2012 s/p TEE/DCCV. c. Recurrence 08/2012 in setting of abnormal thyroid panel.; 12/2012 AVN ablation by Dr [name]  . Hypertension   . Obesity   . Non-compliance   . Dyslexia     unable to read.  Marland Kitchen History of CVA (cerebrovascular accident) 11/2008    a. 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 RBBB (right bundle branch block)   . Cough syncope     a. During 08/2012 adm.  . Systolic CHF, chronic 01/19/1996  . Hypothyroidism 01/19/1996    s/p RAI therapy  . CKD (chronic kidney disease)   . Gout 04/23/2014       . Shortness of breath dyspnea     Assessment: 54yo male c/o SOB/cough and ICD firing for VT, has h/o severe heart failure but not candidate for LVAD/transplant, to continue Coumadin for Afib; current INR below goal w/ last Coumadin dose taken 4/25; of note pt is supposed to be taking  7.5mg  TTSun and 3.375mg  MWFSat but he has been taking 5mg  daily to finish the Rx he has which results in a 2.5mg /wk deficit, outpt anti-coag notes also mention pt taking Coumadin differently than prescribed at other encounters.  INR is therapeutic this am after boosted dose last night (2.3). Note ICD shock, electrolyte replacement ordered. No cbc this am, no bleeding events noted overnight.  Goal of Therapy:  INR 2-3   Plan:  Warfarin 5mg  tonight Daily INR for now  Sheppard Coil PharmD., BCPS Clinical Pharmacist Pager [phone] 05/15/2014 8:29 AM

## 2014-05-15 NOTE — Progress Notes (Signed)
UR completed 

## 2014-05-15 NOTE — Progress Notes (Signed)
Pharmacist Heart Failure Core Measure Documentation  Assessment: Raymond [name] has an EF documented as 10%.  Rationale: Heart failure patients with left ventricular systolic dysfunction (LVSD) and an EF < 40% should be prescribed an angiotensin converting enzyme inhibitor (ACEI) or angiotensin receptor blocker (ARB) at discharge unless a contraindication is documented in the medical record.  This patient is not currently on an ACEI or ARB for HF.  This note is being placed in the record in order to provide documentation that a contraindication to the use of these agents is present for this encounter.  ACE Inhibitor or Angiotensin Receptor Blocker is contraindicated (specify all that apply)  []   ACEI allergy AND ARB allergy []   Angioedema []   Moderate or severe aortic stenosis []   Hyperkalemia []   Hypotension []   Renal artery stenosis [x]   Worsening renal function, preexisting renal disease or dysfunction   Raymond [name] 05/15/2014 2:30 PM

## 2014-05-16 ENCOUNTER — Ambulatory Visit: Payer: Medicare Other

## 2014-05-16 DIAGNOSIS — I5023 Acute on chronic systolic (congestive) heart failure: Secondary | ICD-10-CM

## 2014-05-16 LAB — CARBOXYHEMOGLOBIN
CARBOXYHEMOGLOBIN: 1.1 % (ref 0.5–1.5)
Methemoglobin: 0.6 % (ref 0.0–1.5)
O2 SAT: 40 %
Total hemoglobin: 11.8 g/dL — ABNORMAL LOW (ref 13.5–18.0)

## 2014-05-16 LAB — BASIC METABOLIC PANEL
Anion gap: 6 (ref 5–15)
BUN: 15 mg/dL (ref 6–23)
CALCIUM: 8.9 mg/dL (ref 8.4–10.5)
CO2: 29 mmol/L (ref 19–32)
Chloride: 97 mmol/L (ref 96–112)
Creatinine, Ser: 1.78 mg/dL — ABNORMAL HIGH (ref 0.50–1.35)
GFR calc Af Amer: 49 mL/min — ABNORMAL LOW (ref >90)
GFR calc non Af Amer: 42 mL/min — ABNORMAL LOW (ref >90)
GLUCOSE: 127 mg/dL — AB (ref 70–99)
Potassium: 4.6 mmol/L (ref 3.5–5.1)
Sodium: 132 mmol/L — ABNORMAL LOW (ref 135–145)

## 2014-05-16 LAB — PROTIME-INR
INR: 5.03 — AB (ref 0.00–1.49)
Prothrombin Time: 47.1 seconds — ABNORMAL HIGH (ref 11.6–15.2)

## 2014-05-16 LAB — MAGNESIUM: Magnesium: 2.3 mg/dL (ref 1.5–2.5)

## 2014-05-16 MED ORDER — POTASSIUM CHLORIDE ER 10 MEQ PO TBCR
40.0000 meq | EXTENDED_RELEASE_TABLET | Freq: Two times a day (BID) | ORAL | Status: DC
  Administered 2014-05-17: 40 meq via ORAL
  Filled 2014-05-16 (×3): qty 4

## 2014-05-16 MED ORDER — FUROSEMIDE 10 MG/ML IJ SOLN
80.0000 mg | Freq: Two times a day (BID) | INTRAMUSCULAR | Status: DC
  Administered 2014-05-16 – 2014-05-19 (×5): 80 mg via INTRAVENOUS
  Filled 2014-05-16 (×11): qty 8

## 2014-05-16 MED ORDER — TORSEMIDE 100 MG PO TABS
100.0000 mg | ORAL_TABLET | Freq: Two times a day (BID) | ORAL | Status: DC
  Administered 2014-05-17 – 2014-05-20 (×4): 100 mg via ORAL
  Filled 2014-05-16 (×9): qty 1

## 2014-05-16 NOTE — Progress Notes (Signed)
Advanced Heart Failure Rounding Note   Subjective:    Admitted after multiple ICD shocks. Yesterday K and mag supplemented. Over night several episode of NSVT noted on tele. EP following. Mexilitene added.  SOB at rest. Coughing.  Fatigued  Objective:   Weight Range:  Vital Signs:   Temp:  [97.8 F (36.6 C)-98.1 F (36.7 C)] 98.1 F (36.7 C) (04/28 0548) Pulse Rate:  [69-70] 70 (04/28 0548) Resp:  [19-100] 22 (04/28 0548) BP: (100-134)/(65-84) 108/70 mmHg (04/28 0548) SpO2:  [97 %-100 %] 97 % (04/28 0548) Weight:  [117.935 kg (260 lb)] 117.935 kg (260 lb) (04/28 0500) Last BM Date: 06-02-14  Weight change: Filed Weights   06-02-14 0656 05/15/14 0415 05/16/14 0500  Weight: 120.2 kg (264 lb 15.9 oz) 118.9 kg (262 lb 2 oz) 117.935 kg (260 lb)    Intake/Output:   Intake/Output Summary (Last 24 hours) at 05/16/14 1320 Last data filed at 05/16/14 1302  Gross per 24 hour  Intake    370 ml  Output    450 ml  Net    -80 ml     Physical Exam: General:  Fatigued appearing. Breathless when talking.  HEENT: normal Neck: supple. JVP to jaw  Carotids 2+ bilat; no bruits. No lymphadenopathy or thryomegaly appreciated. Cor: PMI laterally displaced. Regular rate & rhythm. +s3 Lungs: clear Abdomen: soft, obese nontender, nondistended. No hepatosplenomegaly. No bruits or masses. Good bowel sounds. Extremities: no cyanosis, clubbing, rash, tr edema Neuro: alert & orientedx3, cranial nerves grossly intact. Weak on left side . Affect pleasant  Telemetry:  NSR with several episodes NSVT   Labs: Basic Metabolic Panel:  Recent Labs Lab Jun 02, 2014 0143 05/15/14 0538 05/15/14 1545 05/16/14 1000  NA 132* 137 133* 132*  K 3.6 3.0* 3.9 4.6  CL 98 95* 96 97  CO2 20 25 30 29   GLUCOSE 159* 125* 140* 127*  BUN 13 11 12 15   CREATININE 1.60* 1.61* 1.61* 1.78*  CALCIUM 9.0 7.8* 8.7 8.9  MG 1.9  --  2.4 2.3  PHOS 3.0  --   --   --     Liver Function Tests:  Recent Labs Lab  06/02/14 0143  AST 25  ALT 19  ALKPHOS 109  BILITOT 1.7*  PROT 6.3  ALBUMIN 3.0*    Magnesium 2.3  No results for input(s): LIPASE, AMYLASE in the last 168 hours. No results for input(s): AMMONIA in the last 168 hours.  CBC:  Recent Labs Lab 06/02/14 0143  WBC 8.1  NEUTROABS 6.9  HGB 12.2*  HCT 37.6*  MCV 91.3  PLT 307    Cardiac Enzymes: No results for input(s): CKTOTAL, CKMB, CKMBINDEX, TROPONINI in the last 168 hours.  BNP: BNP (last 3 results)  Recent Labs  03/28/14 1827 03/30/14 1010 2014/06/02 0820  BNP 590.5* 484.2* 1058.3*    ProBNP (last 3 results)  Recent Labs  12/26/13 1205 05/03/14 1035  PROBNP 4324.0* 629.0*      Other results:  Imaging: No results found.   Medications:     Scheduled Medications: . acyclovir  400 mg Oral Daily  . amiodarone  400 mg Oral BID  . cyclobenzaprine  10 mg Oral BID  . ferrous sulfate  325 mg Oral BID WC  . furosemide  80 mg Intravenous BID  . levothyroxine  250 mcg Oral QAC breakfast  . magnesium oxide  400 mg Oral Daily  . mexiletine  200 mg Oral 3 times per day  . potassium chloride  40  mEq Oral BID  . ranolazine  1,000 mg Oral BID  . senna-docusate  2 tablet Oral QHS  . sodium chloride  3 mL Intravenous Q12H  . spironolactone  25 mg Oral BID  . [START ON 05/17/2014] torsemide  100 mg Oral BID  . Warfarin - Pharmacist Dosing Inpatient   Does not apply q1800    Infusions: . milrinone 0.375 mcg/kg/min (05/16/14 0554)    PRN Medications: sodium chloride, acetaminophen, ALPRAZolam, benzonatate, guaiFENesin-codeine, menthol-cetylpyridinium, ondansetron (ZOFRAN) IV, sodium chloride, sodium chloride   Assessment:  1. VT- multiple episodes. St Jude Device  2. Hypokalemia/Hypomagnesemia 3. Afib 4. 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 Chronic A fib- on coumadin. CHADS2VASC score of 4   Plan/Discussion:    BMET pending.  Per EP not a candidate for  ablation. Continue amiodarone 400 mg twice a day + mexiliene.  Breathless talking. Volume status elevated. Stop torsemide and start 80 mg IV lasix twice a day.    Remains on milrinone 0.375 mcg for end stage heart failure which is not helping with ongoing VT. Continue current HF regimen. Follow electrolytes closely.   Will need to discuss goals of care.   Length of Stay: 1  Baran Kuhrt 05/16/2014, 1:20 PM  Advanced Heart Failure Team Pager [phone] (M-F; 7a - 4p)  Please contact [facility] for night-coverage after hours (4p -7a ) and weekends on [email or web address]  Patient seen and examined with Tonye Becket, NP. We discussed all aspects of the encounter. I agree with the assessment and plan as stated above.   Continues to have runs of NSVT despite addition of mexilitene and correction of electrolyte abnormalities. Not candidate for advanced therapies due to social situation. Options very limited. Need to continue milrinone due to profound low output state. Discussed situation with him and his mother. For now wants to continue aggressive care but if continues to get ICD shocks may consider switch to comfort care. Agree with IV lasix today. Check co-ox.   Niels Cranshaw,MD 1:44 PM

## 2014-05-16 NOTE — Clinical Documentation Improvement (Signed)
MD's, NP's, and PA's  Noted "CKD" with  Creatinine level   1.61/1.61/1.60 and GFR 48/48/48 please document significance of lab values if appropriate for this admission? Thank you  Possible Clinical Conditions?   CKD Stage I - GFR > OR = 90 CKD Stage II - GFR 60-80 CKD Stage III - GFR 30-59 CKD Stage IV - GFR 15-29 CKD Stage V - GFR < 15  Other condition Cannot Clinically determine    Risk Factors: HTN, Acute on Chronic Systolic Heart Failure, Ventricular Tachycardia :  Diagnostics: BMET   Treatment: Monitoring  Thank You, Raymond [name] ,RN Clinical Documentation Specialist:  [phone]  [facility]- Health Information Management

## 2014-05-16 NOTE — Progress Notes (Signed)
ANTICOAGULATION CONSULT NOTE   Pharmacy Consult for Coumadin Indication: atrial fibrillation  Allergies  Allergen Reactions  . Valsartan Cough    Patient Measurements: Height: 5\' 9"  (175.3 cm) Weight:  (Pt is angry at the monent. he want to sleep. refuse weight) IBW/kg (Calculated) : 70.7  Vital Signs: Temp: 98.1 F (36.7 C) (04/28 0548) Temp Source: Oral (04/28 0548) BP: 108/70 mmHg (04/28 0548) Pulse Rate: 70 (04/28 0548)  Labs:  Recent Labs  05/05/2014 0143 04/23/2014 0820 05/15/14 0538 05/15/14 1545 05/16/14 1000  HGB 12.2*  --   --   --   --   HCT 37.6*  --   --   --   --   PLT 307  --   --   --   --   LABPROT 18.7* 20.4* 25.5*  --  47.1*  INR 1.54* 1.73* 2.30*  --  5.03*  CREATININE 1.60*  --  1.61* 1.61* 1.78*    Estimated Creatinine Clearance: 62.2 mL/min (by C-G formula based on Cr of 1.78).   Medical History: Past Medical History  Diagnosis Date  . Non-ischemic cardiomyopathy     a. echo 11/10: EF 15%;   b. cath 10/08: normal cors;  c. Myoview 5/12: Very mild distal ant and apical isch, EF 17% (low risk-medical Rx cont'd);  d.  Echo 4/14:  EF 15%;  e. 05/2009 s/p SJM BiV ICD;  f. 07/2012 TEE: EF 20-25%, diff HK mild MR, mod TR.   Marland Kitchen Atrial fibrillation     a. on coumadin;  b. 07/2012 s/p TEE/DCCV. c. Recurrence 08/2012 in setting of abnormal thyroid panel.; 12/2012 AVN ablation by Dr [name]  . Hypertension   . Obesity   . Non-compliance   . Dyslexia     unable to read.  Marland Kitchen History of CVA (cerebrovascular accident) 11/2008    a. 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 RBBB (right bundle branch block)   . Cough syncope     a. During 08/2012 adm.  . Systolic CHF, chronic 01/19/1996  . Hypothyroidism 01/19/1996    s/p RAI therapy  . CKD (chronic kidney disease)   . Gout 04/23/2014       . Shortness of breath dyspnea     Assessment: 54yo male c/o SOB/cough and ICD firing for VT, has h/o severe heart failure but not candidate for  LVAD/transplant, to continue Coumadin for Afib; current INR below goal w/ last Coumadin dose taken 4/25; of note pt is supposed to be taking 7.5mg  TTSun and 3.375mg  MWFSat but he has been taking 5mg  daily to finish the Rx he has which results in a 2.5mg /wk deficit, outpt anti-coag notes also mention pt taking Coumadin differently than prescribed at other encounters.  INR is Supratherapeutic this am after boosted dose 4/26. Note ICD shock, electrolyte replacement ordered. No cbc this am, no bleeding events noted during rounds.  Goal of Therapy:  INR 2-3   Plan:  Hold warfarin tonight Daily INR for now  Sheppard Coil PharmD., BCPS Clinical Pharmacist Pager [phone] 05/16/2014 11:46 AM

## 2014-05-16 NOTE — Progress Notes (Signed)
Patient refused  weight this morning. 

## 2014-05-16 NOTE — Patient Outreach (Signed)
[facility]) Care Management  05/16/2014  Raymond [name] 02-16-1960 [phone] Active patient: Met with the patient at bedside.  Patient states he wishes to continue services with [facility] Care Management at discharge.  Patient will receive post hospital telephone calls and be assessed for ongoing home visits.  [facility] Care Management does not replace or interfere with any services needed or arranged for this patient.  For questions, please contact: Natividad Brood, RN BSN [facility] Liaison  [phone] business mobile phone

## 2014-05-16 NOTE — Progress Notes (Signed)
INR 5.03 per lab. NP called with results. Hiram Comber RN

## 2014-05-17 ENCOUNTER — Encounter (HOSPITAL_COMMUNITY): Payer: Medicare Other

## 2014-05-17 DIAGNOSIS — Z515 Encounter for palliative care: Secondary | ICD-10-CM

## 2014-05-17 DIAGNOSIS — R0600 Dyspnea, unspecified: Secondary | ICD-10-CM

## 2014-05-17 LAB — PROTIME-INR
INR: 7.61 (ref 0.00–1.49)
Prothrombin Time: 65 seconds — ABNORMAL HIGH (ref 11.6–15.2)

## 2014-05-17 LAB — BASIC METABOLIC PANEL
Anion gap: 11 (ref 5–15)
BUN: 22 mg/dL (ref 6–23)
CALCIUM: 8.8 mg/dL (ref 8.4–10.5)
CO2: 24 mmol/L (ref 19–32)
Chloride: 95 mmol/L — ABNORMAL LOW (ref 96–112)
Creatinine, Ser: 1.97 mg/dL — ABNORMAL HIGH (ref 0.50–1.35)
GFR calc Af Amer: 44 mL/min — ABNORMAL LOW (ref >90)
GFR calc non Af Amer: 38 mL/min — ABNORMAL LOW (ref >90)
Glucose, Bld: 119 mg/dL — ABNORMAL HIGH (ref 70–99)
Potassium: 4.9 mmol/L (ref 3.5–5.1)
SODIUM: 130 mmol/L — AB (ref 135–145)

## 2014-05-17 MED ORDER — PHYTONADIONE 1 MG/0.5 ML ORAL SOLUTION
1.0000 mg | Freq: Once | ORAL | Status: AC
  Administered 2014-05-17: 1 mg via ORAL
  Filled 2014-05-17: qty 0.5

## 2014-05-17 MED ORDER — MORPHINE SULFATE 2 MG/ML IJ SOLN
2.0000 mg | INTRAMUSCULAR | Status: DC | PRN
  Administered 2014-05-17 – 2014-05-19 (×8): 2 mg via INTRAVENOUS
  Filled 2014-05-17 (×8): qty 1

## 2014-05-17 NOTE — Progress Notes (Signed)
ANTICOAGULATION CONSULT NOTE   Pharmacy Consult for Coumadin Indication: atrial fibrillation  Allergies  Allergen Reactions  . Valsartan Cough    Patient Measurements: Height: 5\' 9"  (175.3 cm) Weight:  (Pt is angry at the monent. he want to sleep. refuse weight) IBW/kg (Calculated) : 70.7  Vital Signs: Temp: 98.1 F (36.7 C) (04/29 0613) Temp Source: Axillary (04/29 0613) BP: 103/76 mmHg (04/29 0613) Pulse Rate: 70 (04/29 0613)  Labs:  Recent Labs  05/15/14 0538 05/15/14 1545 05/16/14 1000 05/17/14 0450  LABPROT 25.5*  --  47.1* 65.0*  INR 2.30*  --  5.03* 7.61*  CREATININE 1.61* 1.61* 1.78* 1.97*    Estimated Creatinine Clearance: 56.2 mL/min (by C-G formula based on Cr of 1.97).   Medical History: Past Medical History  Diagnosis Date  . Non-ischemic cardiomyopathy     a. echo 11/10: EF 15%;   b. cath 10/08: normal cors;  c. Myoview 5/12: Very mild distal ant and apical isch, EF 17% (low risk-medical Rx cont'd);  d.  Echo 4/14:  EF 15%;  e. 05/2009 s/p SJM BiV ICD;  f. 07/2012 TEE: EF 20-25%, diff HK mild MR, mod TR.   Marland Kitchen Atrial fibrillation     a. on coumadin;  b. 07/2012 s/p TEE/DCCV. c. Recurrence 08/2012 in setting of abnormal thyroid panel.; 12/2012 AVN ablation by Dr [name]  . Hypertension   . Obesity   . Non-compliance   . Dyslexia     unable to read.  Marland Kitchen History of CVA (cerebrovascular accident) 11/2008    a. 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 RBBB (right bundle branch block)   . Cough syncope     a. During 08/2012 adm.  . Systolic CHF, chronic 01/19/1996  . Hypothyroidism 01/19/1996    s/p RAI therapy  . CKD (chronic kidney disease)   . Gout 04/23/2014       . Shortness of breath dyspnea     Assessment: 54yo male c/o SOB/cough and ICD firing for VT, has h/o severe heart failure but not candidate for LVAD/transplant, to continue Coumadin for Afib; current INR below goal w/ last Coumadin dose taken 4/25; of note pt is supposed to be  taking 7.5mg  TTSun and 3.375mg  MWFSat but he has been taking 5mg  daily to finish the Rx he has which results in a 2.5mg /wk deficit, outpt anti-coag notes also mention pt taking Coumadin differently than prescribed at other encounters.  INR is still Supratherapeutic this am(7.6). Noted ICD shock, electrolyte replacement ordered. No cbc this am, no bleeding events noted during rounds.  With increasing INR and likely multiple end organ failure with INR projected to keep increasing will give low dose vitamin k today.  Goal of Therapy:  INR 2-3   Plan:  Hold warfarin tonight 1mg  vit k oral Daily INR for now  Check cbc in am  Sheppard Coil PharmD., BCPS Clinical Pharmacist Pager [phone] 05/17/2014 10:10 AM

## 2014-05-17 NOTE — Progress Notes (Signed)
Pt is now calm and resting comfortably. Pt states that he can breath better at this time. VS are still stable. Will continue to monitor.

## 2014-05-17 NOTE — Progress Notes (Signed)
Pt refused to get lasix this morning.  Pt stated, "I will not take any medicine that makes me go to the bathroom."

## 2014-05-17 NOTE — Progress Notes (Signed)
Advanced Heart Failure Rounding Note   Subjective:    Admitted after multiple ICD shocks. Yesterday K and mag supplemented. Ongoing episodes of NSVT noted on tele.   Yesterday torsemide stopped and transitioned to IV lasix. Weight down 2 pounds. INR continues rise--> 7.6 coumadin has been on hold. CO-OX 40% on milrinone 0.375 mcg.   Was turning milrinone pump off himself last night as he didn't like beeping. Now refusing diuretics and foley catheter.   SOB at rest. Weak.     Objective:   Weight Range:  Vital Signs:   Temp:  [98 F (36.7 C)-98.1 F (36.7 C)] 98.1 F (36.7 C) (04/29 7494) Pulse Rate:  [70] 70 (04/29 0613) Resp:  [20-24] 20 (04/29 0613) BP: (99-119)/(68-77) 103/76 mmHg (04/29 0613) SpO2:  [96 %-100 %] 97 % (04/29 0613) Last BM Date: 04/29/2014  Weight change: Filed Weights   04/30/2014 0656 05/15/14 0415 05/16/14 0500  Weight: 264 lb 15.9 oz (120.2 kg) 262 lb 2 oz (118.9 kg) 260 lb (117.935 kg)    Intake/Output:   Intake/Output Summary (Last 24 hours) at 05/17/14 0833 Last data filed at 05/16/14 1738  Gross per 24 hour  Intake    684 ml  Output    400 ml  Net    284 ml     Physical Exam: General:  Fatigued appearing. Breathless when talking. Mom at bed side.  HEENT: normal Neck: supple. JVP 9 Carotids 2+ bilat; no bruits. No lymphadenopathy or thryomegaly appreciated. Cor: PMI laterally displaced. Regular rate & rhythm. +s3 Lungs: clear Abdomen: soft, obese nontender, nondistended. No hepatosplenomegaly. No bruits or masses. Good bowel sounds. Extremities: no cyanosis, clubbing, rash, tr edema Neuro: alert & orientedx3, cranial nerves grossly intact. Weak on left side . Affect pleasant  Telemetry:  NSR  Labs: Basic Metabolic Panel:  Recent Labs Lab 04/27/2014 0143 05/15/14 0538 05/15/14 1545 05/16/14 1000 05/17/14 0450  NA 132* 137 133* 132* 130*  K 3.6 3.0* 3.9 4.6 4.9  CL 98 95* 96 97 95*  CO2 20 25 30 29 24   GLUCOSE 159* 125* 140* 127*  119*  BUN 13 11 12 15 22   CREATININE 1.60* 1.61* 1.61* 1.78* 1.97*  CALCIUM 9.0 7.8* 8.7 8.9 8.8  MG 1.9  --  2.4 2.3  --   PHOS 3.0  --   --   --   --     Liver Function Tests:  Recent Labs Lab 05/11/2014 0143  AST 25  ALT 19  ALKPHOS 109  BILITOT 1.7*  PROT 6.3  ALBUMIN 3.0*      No results for input(s): LIPASE, AMYLASE in the last 168 hours. No results for input(s): AMMONIA in the last 168 hours.  CBC:  Recent Labs Lab 04/22/2014 0143  WBC 8.1  NEUTROABS 6.9  HGB 12.2*  HCT 37.6*  MCV 91.3  PLT 307    Cardiac Enzymes: No results for input(s): CKTOTAL, CKMB, CKMBINDEX, TROPONINI in the last 168 hours.  BNP: BNP (last 3 results)  Recent Labs  03/28/14 1827 03/30/14 1010 05/10/2014 0820  BNP 590.5* 484.2* 1058.3*    ProBNP (last 3 results)  Recent Labs  12/26/13 1205 05/03/14 1035  PROBNP 4324.0* 629.0*      Other results:  Imaging: No results found.   Medications:     Scheduled Medications: . acyclovir  400 mg Oral Daily  . amiodarone  400 mg Oral BID  . cyclobenzaprine  10 mg Oral BID  . ferrous sulfate  325 mg  Oral BID WC  . furosemide  80 mg Intravenous BID  . levothyroxine  250 mcg Oral QAC breakfast  . magnesium oxide  400 mg Oral Daily  . mexiletine  200 mg Oral 3 times per day  . potassium chloride  40 mEq Oral BID  . ranolazine  1,000 mg Oral BID  . senna-docusate  2 tablet Oral QHS  . sodium chloride  3 mL Intravenous Q12H  . spironolactone  25 mg Oral BID  . torsemide  100 mg Oral BID  . Warfarin - Pharmacist Dosing Inpatient   Does not apply q1800    Infusions: . milrinone 0.375 mcg/kg/min (05/17/14 0759)    PRN Medications: sodium chloride, acetaminophen, ALPRAZolam, benzonatate, guaiFENesin-codeine, menthol-cetylpyridinium, ondansetron (ZOFRAN) IV, sodium chloride, sodium chloride   Assessment:  1. VT- multiple episodes. St Jude Device  2. Hypokalemia/Hypomagnesemia 3. Afib 4. End Stage HF on chronic  systolic heart failure- on home milrinone 0.375 mcg 5. Chronic A fib- on coumadin. CHADS2VASC score of 4 6. CKD Stage III-    Plan/Discussion:     Per EP not a candidate for ablation. Continue amiodarone 400 mg twice a day + mexiliene.   Volume status remains elevated. Now refusing diuretics and foley. He understands that his breathing will get worse.  INR 7.6. Off coumadin.   Remains on milrinone 0.375 mcg for end stage heart failure which is not helping with ongoing VT. Continue current HF regimen.   Will ask palliative care to see today. He is clearly end stage and refractory VT.  Discussed that he is end stage heart failure and will have ongoing dyspnea . He agrees to morphine for increased dyspnea.   Length of Stay: 2  CLEGG,AMY NP-C  05/17/2014, 8:33 AM  Advanced Heart Failure Team Pager [phone] (M-F; 7a - 4p)  Please contact [facility] for night-coverage after hours (4p -7a ) and weekends on [email or web address]   Patient seen and examined with Tonye Becket, NP. We discussed all aspects of the encounter. I agree with the assessment and plan as stated above.   He is actively dying from HF with recurrent VT. Now refusing certain meds. Long talk with hi and his mom about situation and told them that comfort care might be best option. His mother feels that this is best option but [name] wants to continue aggressive care for now. Will continue milrinone. Change diuretics to po. Consult Palliative Care.   Rane Blitch,MD 10:51 AM

## 2014-05-17 NOTE — Progress Notes (Signed)
Pt is continually calling the nurses station for the RN to come to his room. Pt also has called his brother and girlfriend tonight stating that no one is helping him. RN did speak with both the girlfriend and the brother. Per pt's girlfriend pt does have anxiety and does this at home. Oxygen level is still at 100% at this time. Pt did start yelling out "help" and was advised that he cannot be yelling out like that and was re-educated on the use of his telephone and call light. RN also explained to the pt that we need to allow the xanax to start working but that if any of his VS started becoming a concern that the MD would be notified immediately. Will continue to monitor.

## 2014-05-17 NOTE — Progress Notes (Addendum)
On-call MD was notified about pt's behavior and situation. Pt still states that he is "struggling to breath", but is still sating at 100%. A mobile VS machine was taken into the pt's room and the pt was shown and explained all the VS on the screen. Pt seems to be starting to calm down at this time and seems to be getting drowsy. Will continue to educate and monitor. MD did not place any new orders at this time. MD did request that we look into either getting a sitter to help keep the pt calm or seeing if a family member can come sit with him. At this time the pt does not qualify for a safety sitter since there is not a "safety" concern. Did request that a family member come to the hospital for the pt and pt's girlfriend states that she will be here early in the morning maybe around 8 am. Will try to keep pt comfortable and calm until then, and will continue to educate and monitor.

## 2014-05-17 NOTE — Progress Notes (Signed)
Pt is refusing to stand up to be weighed. Pt was educated by Personnel officer but still refuses.

## 2014-05-17 NOTE — Progress Notes (Signed)
Pt called RN to room complaining of shortness of breath. Oxygen level was checked and pt is sating at 100% on 3L. HR is 70 at this time and pt is AV Paced on the monitor. Pt is very anxious at this time and Xanax 0.5mg  PO was given. Will continue to monitor.

## 2014-05-17 NOTE — Consult Note (Signed)
Consultation Note Date: 05/17/2014   Patient Name: Raymond [name]  DOB: [birth year]  MRN: [id number]  Age / Sex: 54 y.o., male   PCP: Ethelda Chick, MD Referring Physician: Dolores Patty, MD  Reason for Consultation: GOC, hospice  Palliative Care Assessment and Plan Summary of Established Goals of Care and Medical Treatment Preferences    Palliative Care Discussion Held Today Contacts/Participants in Discussion: Primary Decision Maker: Self and then mother    Raymond [name] awakened and agreed to speak with me briefly. He tells me that he is dying. We discussed his frustration that he does not want to take lasix because "I'm tired of peeing on myself." He does not want a foley catheter. I acknowledged his frustration with his declining health. Then I gave him 2 options: 1) Continue this cycle that is further frustrating him and not really working or 2) Focus on comfort and take medications he chooses to help with comfort - which I recommend and to use the help of hospice. At this point he tells me that he is tired and doesn't want to talk about this anymore. He allowed me to ask him one more question in which he confirms he does not want resuscitation. He also have me permission to speak with his mother.  I spoke with his mother and explained our conversation. She agrees with no resuscitation (DNR) and I strongly encouraged that we deactivate his ICD but he did not allow me time to discuss this. She says that she would be comfortable with this and also with hospice/comfort care but it has to be his decision. I explained that he has already decided on comfort indirectly by choosing not to take his medicine. She is very receptive.   Code Status/Advance Care Planning:  DNR  Symptom Management:   Dyspnea: Continue morphine 2 mg every 2 hours prn. May increase frequency/dosage if needed for comfort.   Fatigue: Allow rest for comfort. Try not to interrupt sleep. Maintain calm environment to  allow for comfort.   Psycho-social/Spiritual:   Support System: Has mother and girlfriend for support.    Prognosis: Days to weeks.   Discharge Planning: To be determined       Chief Complaint/History of Present Illness: 54 yo male with PMH of end stage systolic heart failure (EF %) on home milrinone, atrial fibrillation, CKD stage III, VT, s/p ICD. Admitted with shortness of breath and ICD firing. He has been refusing lasix and turning off milrinone and will not make clear decision for comfort but refusing/not cooperating with care.   Primary Diagnoses  Present on Admission:  . Acute on chronic clinical systolic heart failure . Acute on chronic renal failure . Atrial fibrillation [I48.91] . CKD (chronic kidney disease), stage III  Palliative Review of Systems: + shortness of breath, + fatigue  I have reviewed the medical record, interviewed the patient and family, and examined the patient. The following aspects are pertinent.  Past Medical History  Diagnosis Date  . Non-ischemic cardiomyopathy     a. echo 11/10: EF 15%;   b. cath 10/08: normal cors;  c. Myoview 5/12: Very mild distal ant and apical isch, EF 17% (low risk-medical Rx cont'd);  d.  Echo 4/14:  EF 15%;  e. 05/2009 s/p SJM BiV ICD;  f. 07/2012 TEE: EF 20-25%, diff HK mild MR, mod TR.   Marland Kitchen Atrial fibrillation     a. on coumadin;  b. 07/2012 s/p TEE/DCCV. c. Recurrence 08/2012 in setting of  abnormal thyroid panel.; 12/2012 AVN ablation by Dr [name]  . Hypertension   . Obesity   . Non-compliance   . Dyslexia     unable to read.  Marland Kitchen History of CVA (cerebrovascular accident) 11/2008    a. 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 RBBB (right bundle branch block)   . Cough syncope     a. During 08/2012 adm.  . Systolic CHF, chronic 01/19/1996  . Hypothyroidism 01/19/1996    s/p RAI therapy  . CKD (chronic kidney disease)   . Gout 04/23/2014       . Shortness of breath dyspnea    History   Social  History  . Marital Status: Legally Separated    Spouse Name: N/A  . Number of Children: 0  . Years of Education: N/A   Occupational History  . DISABLED    Social History Main Topics  . Smoking status: Never Smoker   . Smokeless tobacco: Never Used  . Alcohol Use: 0.0 oz/week    0 Standard drinks or equivalent per week     Comment: Rare  . Drug Use: No  . Sexual Activity: Not Currently   Other Topics Concern  . None   Social History Narrative   Marital status: divorced since 2014; married x 16 years; dating x 4 years since CVA.      Children: none      Lives: Lives alone in house in [place].        Employment:  Disabled from CVA in 2011 (L sided weakness).      Tobacco: none      Alcohol: rarely      Drugs: none      Exercise:  Sporadically.      ADLs: drives; independent with ADLs; girlfriend does grocery shopping; ambulates with cane.      Seatbelt: 100%       Family History  Problem Relation Age of Onset  . Heart disease Father 37    AMI  . Diabetes Father   . Stroke Father 70    CVA x 2  . Diabetes Sister   . Atrial fibrillation Sister   . Hyperlipidemia Mother   . Hypertension Mother   . Heart disease Mother 44    CHF   Scheduled Meds: . acyclovir  400 mg Oral Daily  . amiodarone  400 mg Oral BID  . cyclobenzaprine  10 mg Oral BID  . ferrous sulfate  325 mg Oral BID WC  . furosemide  80 mg Intravenous BID  . levothyroxine  250 mcg Oral QAC breakfast  . magnesium oxide  400 mg Oral Daily  . mexiletine  200 mg Oral 3 times per day  . ranolazine  1,000 mg Oral BID  . senna-docusate  2 tablet Oral QHS  . sodium chloride  3 mL Intravenous Q12H  . spironolactone  25 mg Oral BID  . torsemide  100 mg Oral BID  . Warfarin - Pharmacist Dosing Inpatient   Does not apply q1800   Continuous Infusions: . milrinone 0.375 mcg/kg/min (05/17/14 0759)   PRN Meds:.sodium chloride, acetaminophen, ALPRAZolam, benzonatate, guaiFENesin-codeine, menthol-cetylpyridinium,  morphine injection, ondansetron (ZOFRAN) IV, sodium chloride, sodium chloride Medications Prior to Admission:  Prior to Admission medications   Medication Sig Start Date End Date Taking? Authorizing Provider  acyclovir (ZOVIRAX) 800 MG tablet Take 400 mg by mouth daily.    Yes Historical Provider, MD  ALPRAZolam Prudy Feeler) 0.25 MG tablet Take 1 tablet (  0.25 mg total) by mouth at bedtime as needed for anxiety. 05/08/14  Yes Ethelda Chick, MD  amiodarone (PACERONE) 200 MG tablet Take 2 tablets (400 mg total) by mouth 2 (two) times daily. 05/09/14  Yes Laurey Morale, MD  benzonatate (TESSALON) 100 MG capsule Take 1-2 capsules (100-200 mg total) by mouth 3 (three) times daily as needed for cough. 05/09/14  Yes Ethelda Chick, MD  cyclobenzaprine (FLEXERIL) 10 MG tablet Take 1 tablet (10 mg total) by mouth 2 (two) times daily. 07/26/13  Yes Pearline Cables, MD  DM-Doxylamine-Acetaminophen (NYQUIL COLD & FLU PO) Take 30 mLs by mouth daily as needed (for cold).   Yes Historical Provider, MD  ferrous sulfate 325 (65 FE) MG tablet Take 1 tablet (325 mg total) by mouth 2 (two) times daily with a meal. 05/08/14  Yes Amy D Clegg, NP  guaiFENesin-codeine (ROBITUSSIN AC) 100-10 MG/5ML syrup Take 5 mLs by mouth 4 (four) times daily as needed for cough. 05/11/14  Yes Ethelda Chick, MD  levothyroxine (SYNTHROID, LEVOTHROID) 125 MCG tablet Take 2 tablets (250 mcg total) by mouth daily before breakfast. Additional refill should come from PCP 03/27/14  Yes Amy D Clegg, NP  magnesium oxide (MAG-OX) 400 (241.3 MG) MG tablet Take 1 tablet (400 mg total) by mouth daily. 04/09/14  Yes Ok Anis, NP  Menthol (RICOLA) LOZG Use as directed 1 lozenge in the mouth or throat daily as needed (for cough).   Yes Historical Provider, MD  Milrinone Lactate 50 MG/50ML SOLN Inject 0.375 mcg/kg/min into the vein continuous. Dosing Weight 116.2 kg   Yes Historical Provider, MD  Potassium Chloride ER 20 MEQ TBCR Take 40 mEq by mouth 2  (two) times daily. 05/03/14  Yes Laurey Morale, MD  ranolazine (RANEXA) 1000 MG SR tablet Take 1 tablet (1,000 mg total) by mouth 2 (two) times daily. 04/09/14  Yes Ok Anis, NP  senna-docusate (SENOKOT-S) 8.6-50 MG per tablet Take 2 tablets by mouth at bedtime. 04/19/14  Yes Evlyn Kanner Love, PA-C  spironolactone (ALDACTONE) 25 MG tablet Take 1 tablet (25 mg total) by mouth 2 (two) times daily. 04/19/14  Yes Evlyn Kanner Love, PA-C  torsemide (DEMADEX) 20 MG tablet Take 5 tablets (100 mg total) by mouth 2 (two) times daily. 05/03/14  Yes Laurey Morale, MD  warfarin (COUMADIN) 7.5 MG tablet Take 7.5 mg by mouth daily at 6 PM.  02/22/14  Yes Historical Provider, MD  milrinone (PRIMACOR) 20 MG/100ML SOLN infusion Inject 45.225 mcg/min into the vein continuous. Patient not taking: Reported on 06-05-2014 04/19/14   Evlyn Kanner Love, PA-C  predniSONE (DELTASONE) 10 MG tablet Take 1 tablet (10 mg total) by mouth daily with breakfast. Patient not taking: Reported on 05/08/2014 04/19/14   Evlyn Kanner Love, PA-C  warfarin (COUMADIN) 5 MG tablet Take 1 tablet (5 mg total) by mouth daily at 6 PM. Patient not taking: Reported on 06-05-2014 04/19/14   Jacquelynn Cree, PA-C   Allergies  Allergen Reactions  . Valsartan Cough   CBC:    Component Value Date/Time   WBC 8.1 Jun 05, 2014 0143   WBC 7.6 05/08/2014 1131   HGB 12.2* Jun 05, 2014 0143   HGB 12.7* 05/08/2014 1131   HCT 37.6* Jun 05, 2014 0143   HCT 38.8* 05/08/2014 1131   PLT 307 06-05-2014 0143   MCV 91.3 06-05-14 0143   MCV 90.9 05/08/2014 1131   NEUTROABS 6.9 05-Jun-2014 0143   LYMPHSABS 0.6* 2014-06-05 0143   MONOABS  0.6 05/11/2014 0143   EOSABS 0.1 05/07/2014 0143   BASOSABS 0.0 05/06/2014 0143   Comprehensive Metabolic Panel:    Component Value Date/Time   NA 130* 05/17/2014 0450   K 4.9 05/17/2014 0450   CL 95* 05/17/2014 0450   CO2 24 05/17/2014 0450   BUN 22 05/17/2014 0450   CREATININE 1.97* 05/17/2014 0450   CREATININE 1.34 05/08/2014 1106    GLUCOSE 119* 05/17/2014 0450   CALCIUM 8.8 05/17/2014 0450   AST 25 05/05/2014 0143   ALT 19 05/10/2014 0143   ALKPHOS 109 05/06/2014 0143   BILITOT 1.7* 04/22/2014 0143   PROT 6.3 05/13/2014 0143   ALBUMIN 3.0* 05/15/2014 0143    Physical Exam: Vital Signs: BP 107/78 mmHg  Pulse 70  Temp(Src) 98.1 F (36.7 C) (Axillary)  Resp 20  Ht  (1.753 m)  Wt 117.935 kg (260 lb)  BMI 38.38 kg/m2  SpO2 97% SpO2: SpO2: 97 % O2 Device: O2 Device: Nasal Cannula O2 Flow Rate: O2 Flow Rate (L/min): 3 L/min Intake/output summary:  Intake/Output Summary (Last 24 hours) at 05/17/14 1550 Last data filed at 05/17/14 2130  Gross per 24 hour  Intake    564 ml  Output    500 ml  Net     64 ml   LBM: 4/26 Baseline Weight: Weight: 120.203 kg (265 lb) Most recent weight: Weight:  (Pt is angry at the monent. he want to sleep. refuse weight)  Exam Findings: General: NAD, sleepy HEENT: [place]/AT, + JVD, moist mucous membranes CV: Irreg - A fib Respiratory: No labored breathing, symmetric, room air Abdomen: Soft, NT, ND Extremities: MAE, warm to touch, BLE trace edema Skin: No lesions or open wounds Neuro/Psych: Alert, oriented x 3, pleasant         Palliative Performance Scale: 30              Additional Data Reviewed: Recent Labs     05/16/14  1000  05/17/14  0450  NA  132*  130*  BUN  15  22  CREATININE  1.78*  1.97*     Time In: 1510 Time Out: 1610 Time Total:  Greater than 50%  of this time was spent counseling and coordinating care related to the above assessment and plan.  Signed by: Ulice Bold, NP  Ulice Bold, NP  05/17/2014, 3:50 PM  Please contact Palliative Medicine Team phone at [phone] for questions and concerns.

## 2014-05-18 LAB — BASIC METABOLIC PANEL
Anion gap: 7 (ref 5–15)
BUN: 27 mg/dL — ABNORMAL HIGH (ref 6–23)
CHLORIDE: 94 mmol/L — AB (ref 96–112)
CO2: 27 mmol/L (ref 19–32)
Calcium: 8.9 mg/dL (ref 8.4–10.5)
Creatinine, Ser: 1.8 mg/dL — ABNORMAL HIGH (ref 0.50–1.35)
GFR calc Af Amer: 49 mL/min — ABNORMAL LOW (ref >90)
GFR calc non Af Amer: 42 mL/min — ABNORMAL LOW (ref >90)
GLUCOSE: 135 mg/dL — AB (ref 70–99)
POTASSIUM: 4.3 mmol/L (ref 3.5–5.1)
Sodium: 128 mmol/L — ABNORMAL LOW (ref 135–145)

## 2014-05-18 LAB — CBC
HCT: 34.7 % — ABNORMAL LOW (ref 39.0–52.0)
Hemoglobin: 11.1 g/dL — ABNORMAL LOW (ref 13.0–17.0)
MCH: 28.9 pg (ref 26.0–34.0)
MCHC: 32 g/dL (ref 30.0–36.0)
MCV: 90.4 fL (ref 78.0–100.0)
PLATELETS: 253 10*3/uL (ref 150–400)
RBC: 3.84 MIL/uL — AB (ref 4.22–5.81)
RDW: 16.5 % — AB (ref 11.5–15.5)
WBC: 7 10*3/uL (ref 4.0–10.5)

## 2014-05-18 LAB — PROTIME-INR
INR: 4.74 — AB (ref 0.00–1.49)
PROTHROMBIN TIME: 44.9 s — AB (ref 11.6–15.2)

## 2014-05-18 NOTE — Progress Notes (Signed)
ANTICOAGULATION CONSULT NOTE   Pharmacy Consult for Coumadin Indication: atrial fibrillation  Allergies  Allergen Reactions  . Valsartan Cough    Patient Measurements: Height: 5\' 9"  (175.3 cm) Weight: 262 lb (118.842 kg) IBW/kg (Calculated) : 70.7  Vital Signs: Temp: 97.7 F (36.5 C) (04/30 1436) Temp Source: Oral (04/30 1436) BP: 117/81 mmHg (04/30 1436) Pulse Rate: 65 (04/30 1436)  Labs:  Recent Labs  05/16/14 1000 05/17/14 0450 05/18/14 0505  HGB  --   --  11.1*  HCT  --   --  34.7*  PLT  --   --  253  LABPROT 47.1* 65.0* 44.9*  INR 5.03* 7.61* 4.74*  CREATININE 1.78* 1.97* 1.80*    Estimated Creatinine Clearance: 61.7 mL/min (by C-G formula based on Cr of 1.8).   Medical History: Past Medical History  Diagnosis Date  . Non-ischemic cardiomyopathy     a. echo 11/10: EF 15%;   b. cath 10/08: normal cors;  c. Myoview 5/12: Very mild distal ant and apical isch, EF 17% (low risk-medical Rx cont'd);  d.  Echo 4/14:  EF 15%;  e. 05/2009 s/p SJM BiV ICD;  f. 07/2012 TEE: EF 20-25%, diff HK mild MR, mod TR.   Marland Kitchen Atrial fibrillation     a. on coumadin;  b. 07/2012 s/p TEE/DCCV. c. Recurrence 08/2012 in setting of abnormal thyroid panel.; 12/2012 AVN ablation by Dr [name]  . Hypertension   . Obesity   . Non-compliance   . Dyslexia     unable to read.  Marland Kitchen History of CVA (cerebrovascular accident) 11/2008    a. 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 RBBB (right bundle branch block)   . Cough syncope     a. During 08/2012 adm.  . Systolic CHF, chronic 01/19/1996  . Hypothyroidism 01/19/1996    s/p RAI therapy  . CKD (chronic kidney disease)   . Gout 04/23/2014       . Shortness of breath dyspnea     Assessment: 54yo male c/o SOB/cough and ICD firing for VT, has h/o severe heart failure but not candidate for LVAD/transplant,Pharmacy consluted to continue Coumadin for Afib;  Anticoagulation: afib - INR subtherapeutic on admit; of note pt is supposed to  be taking 7.5mg  TTSun and 3.375mg  MWFSat but he has been taking 5mg  daily to finish the Rx he has which results in a 2.5mg /wk deficit.  Outpt anti-coag notes also mention pt taking Coumadin differently than prescribed at other encounters NOW supratherapeutic due possibly to end organ failure?, INR down somewhat after Vitamin K 1 mg 4/29 no bleeding noted   Goal of Therapy:  INR 2-3   Plan:  Hold warfarin tonight Daily INR for now  Check cbc in am  Thank you for allowing pharmacy to be a part of this patients care team.  Lovenia Kim Pharm.D., BCPS, AQ-Cardiology Clinical Pharmacist 05/18/2014 2:42 PM Pager: [phone] Phone: [phone]

## 2014-05-18 NOTE — Progress Notes (Signed)
Yesterday the pt's mother stated she found some random pills in the pt's tissue box.  Today, I found more random white round pills in the pt's tissue box which were thrown in our sharps/disposal box.  Pt says he just opened them today and wasn't going to take them.  Pt was educated to not have visitors bring home medications to the hospital, and that he could only take medications that we were giving him so as not to given him too much of any one medication.  Pt verbalized understanding.   Again this afternoon he is also refusing to take any oral medication that cause him to have to pee.  Pt took these medications this morning for me, bu now refuses to do so.

## 2014-05-18 NOTE — Progress Notes (Signed)
Advanced Heart Failure Rounding Note   Subjective:    Admitted after multiple ICD shocks. K and mag supplemented. Mexilitene added.  Ongoing episodes of NSVT noted on tele.    CO-OX 40% on milrinone 0.375 mcg.   Continues to refuse lasix and foley catheter.  SOB at rest. Weak.   Was seen by Palliative Care team. He told them he was dying. Wants to be DNR. But did not want to talk further about comfort care or ICD deactivation.      Objective:   Weight Range:  Vital Signs:   Weight:  [161.096 kg (262 lb)] 118.842 kg (262 lb) (04/30 0437) Last BM Date: 05/11/2014  Weight change: Filed Weights   05/15/14 0415 05/16/14 0500 05/18/14 0437  Weight: 118.9 kg (262 lb 2 oz) 117.935 kg (260 lb) 118.842 kg (262 lb)    Intake/Output:  No intake or output data in the 24 hours ending 05/18/14 1416   Physical Exam: General:  Fatigued appearing. Breathless when talking. Mom at bed side.  HEENT: normal Neck: supple. JVP 9 Carotids 2+ bilat; no bruits. No lymphadenopathy or thryomegaly appreciated. Cor: PMI laterally displaced. Regular rate & rhythm. +s3 Lungs: clear Abdomen: soft, obese nontender, nondistended. No hepatosplenomegaly. No bruits or masses. Good bowel sounds. Extremities: no cyanosis, clubbing, rash, tr edema Neuro: alert & orientedx3, cranial nerves grossly intact. Weak on left side . Affect pleasant  Telemetry:  NSR  Labs: Basic Metabolic Panel:  Recent Labs Lab 05/13/2014 0143 05/15/14 0538 05/15/14 1545 05/16/14 1000 05/17/14 0450 05/18/14 0505  NA 132* 137 133* 132* 130* 128*  K 3.6 3.0* 3.9 4.6 4.9 4.3  CL 98 95* 96 97 95* 94*  CO2 GLUCOSE 159* 125* 140* 127* 119* 135*  BUN 27*  CREATININE 1.60* 1.61* 1.61* 1.78* 1.97* 1.80*  CALCIUM 9.0 7.8* 8.7 8.9 8.8 8.9  MG 1.9  --  2.4 2.3  --   --   PHOS 3.0  --   --   --   --   --     Liver Function Tests:  Recent Labs Lab 04/20/2014 0143  AST 25  ALT 19  ALKPHOS 109   BILITOT 1.7*  PROT 6.3  ALBUMIN 3.0*      No results for input(s): LIPASE, AMYLASE in the last 168 hours. No results for input(s): AMMONIA in the last 168 hours.  CBC:  Recent Labs Lab 05/17/2014 0143 05/18/14 0505  WBC 8.1 7.0  NEUTROABS 6.9  --   HGB 12.2* 11.1*  HCT 37.6* 34.7*  MCV 91.3 90.4  PLT 307 253    Cardiac Enzymes: No results for input(s): CKTOTAL, CKMB, CKMBINDEX, TROPONINI in the last 168 hours.  BNP: BNP (last 3 results)  Recent Labs  03/28/14 1827 03/30/14 1010 05/04/2014 0820  BNP 590.5* 484.2* 1058.3*    ProBNP (last 3 results)  Recent Labs  12/26/13 1205 05/03/14 1035  PROBNP 4324.0* 629.0*      Other results:  Imaging: No results found.   Medications:     Scheduled Medications: . acyclovir  400 mg Oral Daily  . amiodarone  400 mg Oral BID  . cyclobenzaprine  10 mg Oral BID  . ferrous sulfate  325 mg Oral BID WC  . furosemide  80 mg Intravenous BID  . levothyroxine  250 mcg Oral QAC breakfast  . magnesium oxide  400 mg Oral Daily  . mexiletine  200 mg Oral 3  times per day  . ranolazine  1,000 mg Oral BID  . senna-docusate  2 tablet Oral QHS  . sodium chloride  3 mL Intravenous Q12H  . spironolactone  25 mg Oral BID  . torsemide  100 mg Oral BID  . Warfarin - Pharmacist Dosing Inpatient   Does not apply q1800    Infusions: . milrinone 0.375 mcg/kg/min (05/18/14 0019)    PRN Medications: sodium chloride, acetaminophen, ALPRAZolam, benzonatate, guaiFENesin-codeine, menthol-cetylpyridinium, morphine injection, ondansetron (ZOFRAN) IV, sodium chloride, sodium chloride   Assessment:  1. VT- multiple episodes. St Jude Device  2. Hypokalemia/Hypomagnesemia 3. Afib 4. 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 Chronic A fib- on coumadin. CHADS2VASC score of 4 6. CKD Stage III-  7. DNR  Plan/Discussion:    He is actively dying from HF with recurrent VT. Now refusing certain meds.  I encouraged him to take his lasix at least for comfort. We again discussed options in detail. He is DNR but does not want to go to full comfort care or deactivate ICD yet. His mother and his sister will be back tomorrow and he wants me to talk with them again. Will continue milrinone. Appreciate Palliative Care's help.   Length of Stay: 3  Arvilla Meres MD  05/18/2014, 2:16 PM  Advanced Heart Failure Team Pager [phone] (M-F; 7a - 4p)  Please contact [facility] for night-coverage after hours (4p -7a ) and weekends on [email or web address]

## 2014-05-19 DIAGNOSIS — R570 Cardiogenic shock: Secondary | ICD-10-CM

## 2014-05-19 LAB — PROTIME-INR
INR: 4.59 — ABNORMAL HIGH (ref 0.00–1.49)
Prothrombin Time: 43.8 seconds — ABNORMAL HIGH (ref 11.6–15.2)

## 2014-05-19 LAB — CARBOXYHEMOGLOBIN
Carboxyhemoglobin: 1.2 % (ref 0.5–1.5)
Methemoglobin: 0.7 % (ref 0.0–1.5)
O2 SAT: 48.8 %
Total hemoglobin: 13.9 g/dL (ref 13.5–18.0)

## 2014-05-19 LAB — BASIC METABOLIC PANEL
Anion gap: 8 (ref 5–15)
BUN: 26 mg/dL — AB (ref 6–20)
CHLORIDE: 93 mmol/L — AB (ref 101–111)
CO2: 29 mmol/L (ref 22–32)
Calcium: 8.7 mg/dL — ABNORMAL LOW (ref 8.9–10.3)
Creatinine, Ser: 1.92 mg/dL — ABNORMAL HIGH (ref 0.61–1.24)
GFR calc Af Amer: 45 mL/min — ABNORMAL LOW (ref >60)
GFR, EST NON AFRICAN AMERICAN: 39 mL/min — AB (ref >60)
Glucose, Bld: 121 mg/dL — ABNORMAL HIGH (ref 70–99)
Potassium: 4 mmol/L (ref 3.5–5.1)
SODIUM: 130 mmol/L — AB (ref 135–145)

## 2014-05-19 MED ORDER — DIPHENHYDRAMINE HCL 50 MG/ML IJ SOLN
25.0000 mg | Freq: Once | INTRAMUSCULAR | Status: AC
  Administered 2014-05-19: 25 mg via INTRAVENOUS
  Filled 2014-05-19: qty 1

## 2014-05-19 MED ORDER — CETYLPYRIDINIUM CHLORIDE 0.05 % MT LIQD
7.0000 mL | Freq: Two times a day (BID) | OROMUCOSAL | Status: DC
  Administered 2014-05-19: 7 mL via OROMUCOSAL

## 2014-05-19 MED ORDER — DIPHENHYDRAMINE HCL 25 MG PO CAPS
25.0000 mg | ORAL_CAPSULE | Freq: Four times a day (QID) | ORAL | Status: DC | PRN
  Administered 2014-05-20: 25 mg via ORAL
  Filled 2014-05-19: qty 1

## 2014-05-19 NOTE — Progress Notes (Signed)
ANTICOAGULATION CONSULT NOTE   Pharmacy Consult for Coumadin Indication: atrial fibrillation  Allergies  Allergen Reactions  . Valsartan Cough    Patient Measurements: Height: 5\' 9"  (175.3 cm) Weight: 260 lb (117.935 kg) IBW/kg (Calculated) : 70.7  Vital Signs: Temp: 97.9 F (36.6 C) (05/01 0359) Temp Source: Oral (05/01 0359) BP: 118/66 mmHg (05/01 0359) Pulse Rate: 70 (05/01 0359)  Labs:  Recent Labs  05/17/14 0450 05/18/14 0505 05/19/14 0600  HGB  --  11.1*  --   HCT  --  34.7*  --   PLT  --  253  --   LABPROT 65.0* 44.9* 43.8*  INR 7.61* 4.74* 4.59*  CREATININE 1.97* 1.80* 1.92*    Estimated Creatinine Clearance: 57.7 mL/min (by C-G formula based on Cr of 1.92).   Medical History: Past Medical History  Diagnosis Date  . Non-ischemic cardiomyopathy     a. echo 11/10: EF 15%;   b. cath 10/08: normal cors;  c. Myoview 5/12: Very mild distal ant and apical isch, EF 17% (low risk-medical Rx cont'd);  d.  Echo 4/14:  EF 15%;  e. 05/2009 s/p SJM BiV ICD;  f. 07/2012 TEE: EF 20-25%, diff HK mild MR, mod TR.   Marland Kitchen Atrial fibrillation     a. on coumadin;  b. 07/2012 s/p TEE/DCCV. c. Recurrence 08/2012 in setting of abnormal thyroid panel.; 12/2012 AVN ablation by Dr [name]  . Hypertension   . Obesity   . Non-compliance   . Dyslexia     unable to read.  Marland Kitchen History of CVA (cerebrovascular accident) 11/2008    a. 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 RBBB (right bundle branch block)   . Cough syncope     a. During 08/2012 adm.  . Systolic CHF, chronic 01/19/1996  . Hypothyroidism 01/19/1996    s/p RAI therapy  . CKD (chronic kidney disease)   . Gout 04/23/2014       . Shortness of breath dyspnea     Assessment: 54yo male c/o SOB/cough and ICD firing for VT, has h/o severe heart failure but not candidate for LVAD/transplant,Pharmacy consluted to continue Coumadin for Afib;  Anticoagulation: afib - INR subtherapeutic on admit; of note pt is supposed to  be taking 7.5mg  TTSun and 3.375mg  MWFSat but he has been taking 5mg  daily to finish the Rx he has which results in a 2.5mg /wk deficit.  Outpt anti-coag notes also mention pt taking Coumadin differently than prescribed at other encounters NOW supratherapeutic due possibly to end organ failure?, INR down somewhat after Vitamin K 1 mg 4/29, but remains above goal. No bleeding noted   Goal of Therapy:  INR 2-3   Plan:  Hold warfarin tonight Daily INR for now  Check cbc in am  Thank you for allowing pharmacy to be a part of this patients care team.  Lovenia Kim Pharm.D., BCPS, AQ-Cardiology Clinical Pharmacist 05/19/2014 12:53 PM Pager: [phone] Phone: [phone]

## 2014-05-19 NOTE — Care Management Note (Addendum)
    Page 1 of 2   06-15-14     4:37:13 PM CARE MANAGEMENT NOTE Jun 15, 2014  Patient:  Raymond [name], Raymond [name]   Account Number:  [id number]  Date Initiated:  05/07/2014  Documentation initiated by:  Donn Pierini  Subjective/Objective Assessment:   Pt admitted with VT     Action/Plan:   PTA pt lived at home- active with [facility] for [facility]- RN/PT/aide and home IV Milrinone- will need resumption orders for [facility] on discharge   Anticipated DC Date:  06-15-14   Anticipated DC Plan:  EXPIRED      DC Planning Services  CM consult      PAC Choice  Resumption Of Svcs/PTA Provider   Choice offered to / List presented to:             [facility] agency  [facility]   Status of service:  Completed, signed off Medicare Important Message given?  YES (If response is "NO", the following Medicare IM given date fields will be blank) Date Medicare IM given:  05/20/2014 Medicare IM given by:  Donn Pierini Date Additional Medicare IM given:  05/24/2014 Additional Medicare IM given by:  Raynald Blend  Discharge Disposition:  EXPIRED  Per UR Regulation:  Reviewed for med. necessity/level of care/duration of stay  If discussed at Long Length of Stay Meetings, dates discussed:   05/28/2014    Comments:  05/28/14- 1700- Donn Pierini RN, BSN [phone] Pt moving towards comfort care- however remains on IV milrinone and IV lasix- if Milrinone turned off- could consider move to [facility] following. Ativan gtt started  05/22/14- 1200- Donn Pierini RN, BSN [phone] Pt started on IV Fentanyl gtt, comfort cart now, DNR, ICD has not been turned off yet, remains on IV milrinone, and IV lasix  05/20/14- 1400- Donn Pierini RN, BSN [phone] Per MD note- care moving toward comfort- plan to start Morphine gtt today,

## 2014-05-19 NOTE — Progress Notes (Signed)
Advanced Heart Failure Rounding Note   Subjective:    Admitted after multiple ICD shocks. K and mag supplemented. Mexilitene added.  Ongoing episodes of NSVT noted on tele.    CO-OX 40% on milrinone 0.375 mcg several days ago.   Continues to refuse lasix and foley catheter. Weight and renal function stable.   SOB at rest. Weak. On intermittent morphine. Family in room.   Was seen by Palliative Care team on 4/29. He told them he was dying. Wants to be DNR. But did not want to talk further about comfort care or ICD deactivation.      Objective:   Weight Range:  Vital Signs:   Temp:  [97.7 F (36.5 C)-97.9 F (36.6 C)] 97.9 F (36.6 C) (05/01 0359) Pulse Rate:  [65-94] 70 (05/01 0359) Resp:  [20] 20 (05/01 0359) BP: (117-140)/(66-86) 118/66 mmHg (05/01 0359) SpO2:  [93 %-100 %] 96 % (05/01 0359) Weight:  [117.935 kg (260 lb)] 117.935 kg (260 lb) (05/01 0359) Last BM Date: 05/07/2014  Weight change: Filed Weights   05/16/14 0500 05/18/14 0437 05/19/14 0359  Weight: 117.935 kg (260 lb) 118.842 kg (262 lb) 117.935 kg (260 lb)    Intake/Output:   Intake/Output Summary (Last 24 hours) at 05/19/14 1009 Last data filed at 05/19/14 0600  Gross per 24 hour  Intake 1514.1 ml  Output    850 ml  Net  664.1 ml     Physical Exam: General:  Fatigued appearing. HEENT: normal Neck: supple. JVP 8 Carotids 2+ bilat; no bruits. No lymphadenopathy or thryomegaly appreciated. Cor: PMI laterally displaced. Regular rate & rhythm. +s3 Lungs: clear Abdomen: soft, obese nontender, nondistended. No hepatosplenomegaly. No bruits or masses. Good bowel sounds. Extremities: no cyanosis, clubbing, rash, tr edema Neuro: alert & orientedx3, cranial nerves grossly intact. Weak on left side . Affect pleasant  Telemetry:  NSR with brief runs of NSVT  Labs: Basic Metabolic Panel:  Recent Labs Lab 05/16/2014 0143  05/15/14 1545 05/16/14 1000 05/17/14 0450 05/18/14 0505 05/19/14 0600  NA 132*   < > 133* 132* 130* 128* 130*  K 3.6  < > 3.9 4.6 4.9 4.3 4.0  CL 98  < > 96 97 95* 94* 93*  CO2 20  < > GLUCOSE 159*  < > 140* 127* 119* 135* 121*  BUN 13  < > 27* 26*  CREATININE 1.60*  < > 1.61* 1.78* 1.97* 1.80* 1.92*  CALCIUM 9.0  < > 8.7 8.9 8.8 8.9 8.7*  MG 1.9  --  2.4 2.3  --   --   --   PHOS 3.0  --   --   --   --   --   --   < > = values in this interval not displayed.  Liver Function Tests:  Recent Labs Lab 05/15/2014 0143  AST 25  ALT 19  ALKPHOS 109  BILITOT 1.7*  PROT 6.3  ALBUMIN 3.0*      No results for input(s): LIPASE, AMYLASE in the last 168 hours. No results for input(s): AMMONIA in the last 168 hours.  CBC:  Recent Labs Lab 04/21/2014 0143 05/18/14 0505  WBC 8.1 7.0  NEUTROABS 6.9  --   HGB 12.2* 11.1*  HCT 37.6* 34.7*  MCV 91.3 90.4  PLT 307 253    Cardiac Enzymes: No results for input(s): CKTOTAL, CKMB, CKMBINDEX, TROPONINI in the last 168 hours.  BNP: BNP (last 3 results)  Recent  Labs  03/28/14 1827 03/30/14 1010 04/26/2014 0820  BNP 590.5* 484.2* 1058.3*    ProBNP (last 3 results)  Recent Labs  12/26/13 1205 05/03/14 1035  PROBNP 4324.0* 629.0*      Other results:  Imaging: No results found.   Medications:     Scheduled Medications: . acyclovir  400 mg Oral Daily  . amiodarone  400 mg Oral BID  . antiseptic oral rinse  7 mL Mouth Rinse BID  . cyclobenzaprine  10 mg Oral BID  . ferrous sulfate  325 mg Oral BID WC  . furosemide  80 mg Intravenous BID  . levothyroxine  250 mcg Oral QAC breakfast  . magnesium oxide  400 mg Oral Daily  . mexiletine  200 mg Oral 3 times per day  . ranolazine  1,000 mg Oral BID  . senna-docusate  2 tablet Oral QHS  . sodium chloride  3 mL Intravenous Q12H  . spironolactone  25 mg Oral BID  . torsemide  100 mg Oral BID  . Warfarin - Pharmacist Dosing Inpatient   Does not apply q1800    Infusions: . milrinone 0.375 mcg/kg/min (05/19/14 0509)    PRN  Medications: sodium chloride, acetaminophen, ALPRAZolam, benzonatate, guaiFENesin-codeine, menthol-cetylpyridinium, morphine injection, ondansetron (ZOFRAN) IV, sodium chloride, sodium chloride   Assessment:  1. VT- multiple episodes. St Jude Device  2. Hypokalemia/Hypomagnesemia 3. Afib 4. 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 Chronic A fib- on coumadin. CHADS2VASC score of 4 6. CKD Stage III-  7. DNR  Plan/Discussion:    He is actively dying from HF with recurrent VT. We again discussed options in detail with him and his family. He is DNR but does not want to go to full comfort care or deactivate ICD yet. His mother and his sister were here today and seem to favor comfort care but he says he wants more time to think about it. I told him that I did not think he could go home and options were 1) Comfort care in hospital with ICD off and milrinone on or off 2) [facility] with milrinone on 3) [facility] with ICD and milrinone off  Will continue milrinone for now. Appreciate Palliative Care's help. Recheck co-ox today.  Length of Stay: 4  Bensimhon, Daniel MD  05/19/2014, 10:09 AM  Advanced Heart Failure Team Pager [phone] (M-F; 7a - 4p)  Please contact [facility] for night-coverage after hours (4p -7a ) and weekends on [email or web address]

## 2014-05-19 DEATH — deceased

## 2014-05-20 LAB — BASIC METABOLIC PANEL
Anion gap: 10 (ref 5–15)
BUN: 29 mg/dL — ABNORMAL HIGH (ref 6–20)
CHLORIDE: 92 mmol/L — AB (ref 101–111)
CO2: 29 mmol/L (ref 22–32)
CREATININE: 1.81 mg/dL — AB (ref 0.61–1.24)
Calcium: 8.8 mg/dL — ABNORMAL LOW (ref 8.9–10.3)
GFR calc non Af Amer: 42 mL/min — ABNORMAL LOW (ref >60)
GFR, EST AFRICAN AMERICAN: 48 mL/min — AB (ref >60)
GLUCOSE: 124 mg/dL — AB (ref 70–99)
Potassium: 3.8 mmol/L (ref 3.5–5.1)
SODIUM: 131 mmol/L — AB (ref 135–145)

## 2014-05-20 LAB — PROTIME-INR
INR: 4.93 — AB (ref 0.00–1.49)
Prothrombin Time: 46.3 seconds — ABNORMAL HIGH (ref 11.6–15.2)

## 2014-05-20 MED ORDER — FENTANYL CITRATE (PF) 100 MCG/2ML IJ SOLN
50.0000 ug | INTRAMUSCULAR | Status: DC | PRN
  Administered 2014-05-21 – 2014-05-28 (×4): 50 ug via INTRAVENOUS
  Filled 2014-05-20 (×4): qty 2

## 2014-05-20 MED ORDER — MORPHINE BOLUS VIA INFUSION
4.0000 mg | Freq: Once | INTRAVENOUS | Status: AC
Start: 2014-05-20 — End: 2014-05-20
  Filled 2014-05-20: qty 4

## 2014-05-20 MED ORDER — MORPHINE SULFATE 4 MG/ML IJ SOLN
4.0000 mg | Freq: Once | INTRAMUSCULAR | Status: AC
  Administered 2014-05-20: 4 mg via INTRAVENOUS

## 2014-05-20 MED ORDER — MORPHINE SULFATE 4 MG/ML IJ SOLN
INTRAMUSCULAR | Status: AC
  Administered 2014-05-20: 4 mg
  Filled 2014-05-20: qty 1

## 2014-05-20 MED ORDER — MIDAZOLAM HCL 2 MG/2ML IJ SOLN
1.0000 mg | Freq: Once | INTRAMUSCULAR | Status: AC
  Administered 2014-05-20: 1 mg via INTRAVENOUS
  Filled 2014-05-20: qty 2

## 2014-05-20 MED ORDER — FUROSEMIDE 10 MG/ML IJ SOLN
80.0000 mg | Freq: Two times a day (BID) | INTRAMUSCULAR | Status: DC
  Administered 2014-05-20 – 2014-05-29 (×19): 80 mg via INTRAVENOUS
  Filled 2014-05-20 (×24): qty 8

## 2014-05-20 MED ORDER — MORPHINE SULFATE 4 MG/ML IJ SOLN
INTRAMUSCULAR | Status: AC
  Administered 2014-05-20: 4 mg via INTRAVENOUS
  Filled 2014-05-20: qty 1

## 2014-05-20 MED ORDER — MORPHINE SULFATE 25 MG/ML IV SOLN
4.0000 mg/h | INTRAVENOUS | Status: DC
  Administered 2014-05-20: 4 mg/h via INTRAVENOUS
  Filled 2014-05-20: qty 10

## 2014-05-20 MED ORDER — DIPHENHYDRAMINE HCL 25 MG PO CAPS
25.0000 mg | ORAL_CAPSULE | ORAL | Status: DC | PRN
  Administered 2014-05-20: 25 mg via ORAL
  Filled 2014-05-20: qty 1

## 2014-05-20 NOTE — Progress Notes (Signed)
Daily Progress Note   Patient Name: Raymond [name]       Date: 05/20/2014 DOB: [birth year]  Age: 54 y.o. MRN#: [id number] Attending Physician: Dolores Patty, MD Primary Care Physician: Nilda Simmer, MD Admit Date: 05/26/2014   Subjective: Discussed a plan with [name] along with his sister and mother at bedside along with Dr. Gala Romney. It was agreed to initiate a morphine drip at low dose for comfort as [name] is very short of breath and restless and with this he agrees to a dose of IV lasix and possible foley catheterization if premedicated with morphine. Mother is very concerned as he is suffering greatly. We reassured [name] and family that we will help him be comfortable. Discussed with mother that he may only have days left. He says he is not ready to deactivate his ICD at this time.    Length of Stay: 5 days  Current Medications: Scheduled Meds:  . acyclovir  400 mg Oral Daily  . amiodarone  400 mg Oral BID  . antiseptic oral rinse  7 mL Mouth Rinse BID  . cyclobenzaprine  10 mg Oral BID  . ferrous sulfate  325 mg Oral BID WC  . furosemide  80 mg Intravenous BID  . levothyroxine  250 mcg Oral QAC breakfast  . magnesium oxide  400 mg Oral Daily  . mexiletine  200 mg Oral 3 times per day  .  morphine injection  4 mg Intravenous Once  . morphine      . ranolazine  1,000 mg Oral BID  . senna-docusate  2 tablet Oral QHS  . sodium chloride  3 mL Intravenous Q12H  . spironolactone  25 mg Oral BID  . Warfarin - Pharmacist Dosing Inpatient   Does not apply q1800    Continuous Infusions: . milrinone 0.375 mcg/kg/min (05/19/14 1955)  . morphine      PRN Meds: sodium chloride, acetaminophen, ALPRAZolam, benzonatate, diphenhydrAMINE, guaiFENesin-codeine, menthol-cetylpyridinium, ondansetron (ZOFRAN) IV, sodium chloride, sodium chloride  Palliative Performance Scale: 30% %  Vital Signs: BP 121/82 mmHg  Pulse 70  Temp(Src) 98.2 F (36.8 C) (Oral)  Resp 18  Ht 5'  9" (1.753 m)  Wt 117.935 kg (260 lb)  BMI 38.38 kg/m2  SpO2 100% SpO2: SpO2: 100 % O2 Device: O2 Device: Nasal Cannula O2 Flow Rate: O2 Flow Rate (L/min): 3.5 L/min  Intake/output summary:  Intake/Output Summary (Last 24 hours) at 05/20/14 1132 Last data filed at 05/20/14 0318  Gross per 24 hour  Intake      0 ml  Output    750 ml  Net   -750 ml   LBM:  4/26 Baseline Weight: Weight: 120.203 kg (265 lb) Most recent weight: Weight:  (REFUSED)  Physical Exam: General: NAD, restless HEENT: [place]/AT, + JVD, moist mucous membranes CV: Irreg - A fib Respiratory: Slightly labored breathing, symmetric, room air Abdomen: Soft, NT, ND Extremities: MAE, warm to touch, BLE trace edema Skin: No lesions or open wounds Neuro/Psych: Alert, oriented x 3, pleasant               Additional Data Reviewed: Recent Labs     05/18/14  0505  05/19/14  0600  05/20/14  0555  WBC  7.0   --    --   HGB  11.1*   --    --   PLT  253   --    --   NA  128*  130*  131*  BUN  27*  26*  29*  CREATININE  1.80*  1.92*  1.81*     Problem List:  Patient Active Problem List   Diagnosis Date Noted  . Dyspnea 05/17/2014  . Ventricular tachycardia 04/21/2014  . Ventricular tachyarrhythmia 05/18/2014  . Atrial fibrillation [I48.91] 05/02/2014  . Gout flare 04/17/2014  . Slow transit constipation   . Physical debility 04/09/2014  . Left hemiparesis 04/09/2014  . Syncope and collapse 03/30/2014  . VT (ventricular tachycardia) 03/30/2014  . ICD discharge 03/30/2014  . Dyslexia 02/27/2014  . DNR (do not resuscitate) 02/22/2014  . Palliative care encounter 02/20/2014  . Shortness of breath 02/20/2014  . Sleep disturbance 02/20/2014  . Acute on chronic renal failure   . Cardiogenic shock   . Acute on chronic systolic CHF (congestive heart failure) 02/14/2014  . Noncompliance 05/02/2013  . Encounter for therapeutic drug monitoring 04/12/2013  . NSVT- Amiodarone added Feb 2016 02/28/2013  .  Hypothyroid-elevated TSH on Amiodarone 02/27/2013  . Chronic anticoagulation 02/27/2013  . PAF-RFA  01/07/13 02/02/2013  . Acute on chronic clinical systolic heart failure 01/07/2013  . CKD (chronic kidney disease), stage III 08/09/2012  . 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 Non-ischemic cardiomyopathy   . Chronic systolic heart failure   . Morbid obesity-BMI43   . Biventricular implantable cardioverter-defibrillator in situ 06/26/2009  . Hypertensive heart disease      Palliative Care Assessment & Plan    Code Status:  DNR   1. Symptom Management:  Dyspnea: Morphine 4 mg/hr continuous infusion. Recommend morphine 2 mg bolus every 30  min prn.    2. Prognosis: Poor - likely days.   3. Discharge Planning: To be determined - Likely hospital death.    Thank you for allowing the Palliative Medicine Team to assist in the care of this patient.   Time In: 1105 Time Out: 1130 Total Time Prolonged Time Billed  no     Greater than 50%  of this time was spent counseling and coordinating care related to the above assessment and plan.   Ulice Bold, NP  05/20/2014, 11:32 AM  Please contact Palliative Medicine Team phone at [phone] for questions and concerns.

## 2014-05-20 NOTE — Progress Notes (Signed)
In to assist tech with obtaining patient's weight. Patient refuses to comply with standing weight. Patient has also been refusing lasix and senokot.  Bedside table currently on patient's left, weak side, per his request. Education ineffective.  Patient being assisted with urinal and repositioning as requested. Patient able to turn to left and supine. Also able to pull self up in bed once [facility] is flat or trendelenburg.  Patient has bed control and call bell within reach.

## 2014-05-20 NOTE — Progress Notes (Signed)
ANTICOAGULATION CONSULT NOTE   Pharmacy Consult for Coumadin Indication: atrial fibrillation  Allergies  Allergen Reactions  . Valsartan Cough  . Morphine Sulfate Itching    Patient Measurements: Height: 5\' 9"  (175.3 cm) Weight:  (REFUSED) IBW/kg (Calculated) : 70.7  Vital Signs: Temp: 98.2 F (36.8 C) (05/02 0403) Temp Source: Oral (05/02 0403) BP: 121/82 mmHg (05/02 0403) Pulse Rate: 70 (05/02 0403)  Labs:  Recent Labs  05/18/14 0505 05/19/14 0600 05/20/14 0555  HGB 11.1*  --   --   HCT 34.7*  --   --   PLT 253  --   --   LABPROT 44.9* 43.8* 46.3*  INR 4.74* 4.59* 4.93*  CREATININE 1.80* 1.92* 1.81*    Estimated Creatinine Clearance: 61.2 mL/min (by C-G formula based on Cr of 1.81).   Medical History: Past Medical History  Diagnosis Date  . Non-ischemic cardiomyopathy     a. echo 11/10: EF 15%;   b. cath 10/08: normal cors;  c. Myoview 5/12: Very mild distal ant and apical isch, EF 17% (low risk-medical Rx cont'd);  d.  Echo 4/14:  EF 15%;  e. 05/2009 s/p SJM BiV ICD;  f. 07/2012 TEE: EF 20-25%, diff HK mild MR, mod TR.   Marland Kitchen Atrial fibrillation     a. on coumadin;  b. 07/2012 s/p TEE/DCCV. c. Recurrence 08/2012 in setting of abnormal thyroid panel.; 12/2012 AVN ablation by Dr [name]  . Hypertension   . Obesity   . Non-compliance   . Dyslexia     unable to read.  Marland Kitchen History of CVA (cerebrovascular accident) 11/2008    a. 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 RBBB (right bundle branch block)   . Cough syncope     a. During 08/2012 adm.  . Systolic CHF, chronic 01/19/1996  . Hypothyroidism 01/19/1996    s/p RAI therapy  . CKD (chronic kidney disease)   . Gout 04/23/2014       . Shortness of breath dyspnea     Assessment: 54yo male c/o SOB/cough and ICD firing for VT, has h/o severe heart failure but not candidate for LVAD/transplant,Pharmacy consluted to continue Coumadin for Afib;  Anticoagulation: afib - INR subtherapeutic on admit; of  note pt is supposed to be taking 7.5mg  TTSun and 3.375mg  MWFSat but he has been taking 5mg  daily to finish the Rx he has which results in a 2.5mg /wk deficit.  Outpt anti-coag notes also mention pt taking Coumadin differently than prescribed at other encounters NOW supratherapeutic due possibly to end organ failure?  INR trending up again today at 4.9, no more vit k given over weekend. No bleeding noted.  Patient continues to refuse most medical treatment. Goal of Therapy:  INR 2-3   Plan:  Hold warfarin for now Daily INR for now  CBC in am   Thank you for allowing pharmacy to be a part of this patients care team.  Sheppard Coil PharmD., BCPS Clinical Pharmacist Pager [phone] 05/20/2014 8:55 AM

## 2014-05-20 NOTE — Progress Notes (Signed)
AT 1736 morphine drip stopped at pt's request. Pt states "I feel like I'm dying, I don't want to die now", explained to pt and sister at bedside that stopping the medication will cause pain he was feeling earlier to return and breathing may become difficult.  Pt insistent to have morphine stopped. Pt alert and awake intermittently, and states that he wants morphine stopped.  Morphine stopped.  Attempted to contact Yong Channel, NP with palliative (no answer, left message on voice mail for number listed for her in progress note) paged PA Theodore Demark for cardiology to inform her, await return calls.

## 2014-05-20 NOTE — Progress Notes (Signed)
Medicare Important Message given? YES  (If response is "NO", the following Medicare IM given date fields will be blank)  Date Medicare IM given: 05/20/14 Medicare IM given by:  Cordale Manera  

## 2014-05-20 NOTE — Progress Notes (Addendum)
Advanced Heart Failure Rounding Note   Subjective:    Admitted after multiple ICD shocks. K and mag supplemented. Mexilitene added.  Ongoing episodes of NSVT noted on tele.    CO-OX 40% on milrinone 0.375 mcg several days ago.   Was seen by Palliative Care team on 4/29. He told them he was dying. Wants to be DNR. But did not want to talk further about comfort care or ICD deactivation.   Continues to refuse lasix and foley catheter. Much more SOB and uncomfortable this am. C/o back pain and restlessness.    Objective:   Weight Range:  Vital Signs:   Temp:  [98.2 F (36.8 C)] 98.2 F (36.8 C) (05/02 0403) Pulse Rate:  [70] 70 (05/02 0403) Resp:  [18-19] 18 (05/02 0403) BP: (116-121)/(81-82) 121/82 mmHg (05/02 0403) SpO2:  [100 %] 100 % (05/02 0403) Last BM Date: 05/15/2014  Weight change: Filed Weights   05/16/14 0500 05/18/14 0437 05/19/14 0359  Weight: 117.935 kg (260 lb) 118.842 kg (262 lb) 117.935 kg (260 lb)    Intake/Output:   Intake/Output Summary (Last 24 hours) at 05/20/14 1139 Last data filed at 05/20/14 0318  Gross per 24 hour  Intake      0 ml  Output    750 ml  Net   -750 ml     Physical Exam: General:  Fatigued appearing. Dyspneic. Uncomfortable.  HEENT: normal Neck: supple. JVP ear Carotids 2+ bilat; no bruits. No lymphadenopathy or thryomegaly appreciated. Cor: PMI laterally displaced. Regular rate & rhythm. +s3 Lungs: clear Abdomen: soft, obese nontender, nondistended. No hepatosplenomegaly. No bruits or masses. Good bowel sounds. Extremities: no cyanosis, clubbing, rash, t1+ edema Neuro: alert & orientedx3, cranial nerves grossly intact. Weak on left side . Affect pleasant  Telemetry:  NSR with brief runs of NSVT  Labs: Basic Metabolic Panel:  Recent Labs Lab 04/20/2014 0143  05/15/14 1545 05/16/14 1000 05/17/14 0450 05/18/14 0505 05/19/14 0600 05/20/14 0555  NA 132*  < > 133* 132* 130* 128* 130* 131*  K 3.6  < > 3.9 4.6 4.9 4.3 4.0 3.8   CL 98  < > 96 97 95* 94* 93* 92*  CO2 20  < > GLUCOSE 159*  < > 140* 127* 119* 135* 121* 124*  BUN 13  < > 27* 26* 29*  CREATININE 1.60*  < > 1.61* 1.78* 1.97* 1.80* 1.92* 1.81*  CALCIUM 9.0  < > 8.7 8.9 8.8 8.9 8.7* 8.8*  MG 1.9  --  2.4 2.3  --   --   --   --   PHOS 3.0  --   --   --   --   --   --   --   < > = values in this interval not displayed.  Liver Function Tests:  Recent Labs Lab 05/05/2014 0143  AST 25  ALT 19  ALKPHOS 109  BILITOT 1.7*  PROT 6.3  ALBUMIN 3.0*      No results for input(s): LIPASE, AMYLASE in the last 168 hours. No results for input(s): AMMONIA in the last 168 hours.  CBC:  Recent Labs Lab 04/22/2014 0143 05/18/14 0505  WBC 8.1 7.0  NEUTROABS 6.9  --   HGB 12.2* 11.1*  HCT 37.6* 34.7*  MCV 91.3 90.4  PLT 307 253    Cardiac Enzymes: No results for input(s): CKTOTAL, CKMB, CKMBINDEX, TROPONINI in the last 168 hours.  BNP: BNP (last 3 results)  Recent Labs  03/28/14 1827 03/30/14 1010 05/03/2014 0820  BNP 590.5* 484.2* 1058.3*    ProBNP (last 3 results)  Recent Labs  12/26/13 1205 05/03/14 1035  PROBNP 4324.0* 629.0*      Other results:  Imaging: No results found.   Medications:     Scheduled Medications: . acyclovir  400 mg Oral Daily  . amiodarone  400 mg Oral BID  . antiseptic oral rinse  7 mL Mouth Rinse BID  . cyclobenzaprine  10 mg Oral BID  . ferrous sulfate  325 mg Oral BID WC  . furosemide  80 mg Intravenous BID  . levothyroxine  250 mcg Oral QAC breakfast  . magnesium oxide  400 mg Oral Daily  . mexiletine  200 mg Oral 3 times per day  .  morphine injection  4 mg Intravenous Once  . morphine      . ranolazine  1,000 mg Oral BID  . senna-docusate  2 tablet Oral QHS  . sodium chloride  3 mL Intravenous Q12H  . spironolactone  25 mg Oral BID  . Warfarin - Pharmacist Dosing Inpatient   Does not apply q1800    Infusions: . milrinone 0.375 mcg/kg/min (05/19/14 1955)  .  morphine      PRN Medications: sodium chloride, acetaminophen, ALPRAZolam, benzonatate, diphenhydrAMINE, guaiFENesin-codeine, menthol-cetylpyridinium, ondansetron (ZOFRAN) IV, sodium chloride, sodium chloride   Assessment:  1. VT- multiple episodes. St Jude Device  2. Hypokalemia/Hypomagnesemia 3. Afib 4. End Stage HF with cardiogenic shock due to acute on chronic systolic heart failure- on home milrinone 0.375 mcg 5. Chronic A fib- on coumadin. CHADS2VASC score of 4 6. CKD Stage III-  7. DNR  Plan/Discussion:    He is actively dying from HF with recurrent VT. Now much more uncomfortable. Family very concerned about his suffering.   I had long talk with Raymond [name] in the presence of Yong Channel NP [facility] team) and his family. I think it has clearly come to the point where we need to focus on comfort care. There is little else we can do for him except to minimize his suffering. He agrees to comfort measures at this point but says he doesn't want [place] to turn off his ICD yet. Will start morphine gtt, place Foley and give him some IV lasix. His family realizes that he may only have days to live. Stop warfarin.   Will continue milrinone for now. Appreciate Palliative Care's help.    Length of Stay: 5  Bensimhon, Daniel MD  05/20/2014, 11:39 AM  Advanced Heart Failure Team Pager [phone] (M-F; 7a - 4p)  Please contact [facility] for night-coverage after hours (4p -7a ) and weekends on [email or web address]

## 2014-05-20 NOTE — Progress Notes (Signed)
Spoke with [place], [place] about pt's wanting morphine drip stopped, etc.(see previous notes).  At this time plan to give pt prn fentanyl IV q 1hr prn for pain when he feels he is ready for it.  Will d/c morphine drip completely --pt and sister felt morphine was helping and the itching had settled down earlier and wanted to continue what was being used. (had spoke with Dr Gala Romney earlier and requested increase in frequency of benadryl or something in addition to help with itching that pt was having from morphine. Dr Gala Romney was ordering to change pain med to fentanyl but pt was relaxed at the time and it was helping his pain, and was no longing itching at the time. Pt and sister and nurse felt morphine was helping and the itching had subsided, pt and sister wanted to continue med he was being given)    Then a short time later, as noted at 1736 pt wanted to stop the morphine stating  " I am dying, and want to stop the morphine drip".  Drip stopped and new orders entered by PA.

## 2014-05-20 NOTE — Progress Notes (Addendum)
Morphine drip bag of approximately 230mg  wasted after ordered to d/c med., wasted in sink.  Witnessed by Agnes Lawrence, RN charge nurse.

## 2014-05-20 NOTE — Progress Notes (Signed)
No return call from palliative staff or from cardiology PA [facility]. Pt resting at moment, girlfriend in and pt's sister updated her on today's events with pt.

## 2014-05-21 ENCOUNTER — Ambulatory Visit: Payer: Medicare Other

## 2014-05-21 LAB — PROTIME-INR
INR: 3.54 — AB (ref 0.00–1.49)
Prothrombin Time: 35.7 seconds — ABNORMAL HIGH (ref 11.6–15.2)

## 2014-05-21 MED ORDER — POTASSIUM CHLORIDE CRYS ER 20 MEQ PO TBCR
40.0000 meq | EXTENDED_RELEASE_TABLET | Freq: Two times a day (BID) | ORAL | Status: DC
  Administered 2014-05-21 – 2014-05-23 (×5): 40 meq via ORAL
  Filled 2014-05-21 (×7): qty 2

## 2014-05-21 MED ORDER — SODIUM CHLORIDE 0.9 % IV SOLN
125.0000 ug/h | INTRAVENOUS | Status: DC
  Administered 2014-05-21: 50 ug/h via INTRAVENOUS
  Administered 2014-05-23: 75 ug/h via INTRAVENOUS
  Administered 2014-05-24: 125 ug/h via INTRAVENOUS
  Filled 2014-05-21 (×3): qty 50

## 2014-05-21 NOTE — Progress Notes (Signed)
Daily Progress Note   Patient Name: Raymond [name]       Date: 05/21/2014 DOB: 02/21/1960  Age: 54 y.o. MRN#: [id number] Attending Physician: Dolores Patty, MD Primary Care Physician: Nilda Simmer, MD Admit Date: 2014/06/09  Subjective: [name] is more lethargic this morning and sleeping more comfortably. Breathing continues to appear slightly labored but is much improved per family at bedside (mother, 2 sisters, brother, brother-in-law). We discussed that he is progressing and educated them that the fentanyl will most likely be needed for breathing comfort even more than pain control and that they should encourage him to be receptive to this when distressed. They say he is very fearful and refuses fentanyl/morphine because he says "I don't want to die." We discussed that these medications are not what is causing his death but that his worsening heart failure with renal impairment. Support provided. Noted ICD still activated.    Length of Stay: 6 days  Current Medications: Scheduled Meds:  . cyclobenzaprine  10 mg Oral BID  . furosemide  80 mg Intravenous BID  . magnesium oxide  400 mg Oral Daily  . senna-docusate  2 tablet Oral QHS  . sodium chloride  3 mL Intravenous Q12H    Continuous Infusions: . milrinone 0.375 mcg/kg/min (05/21/14 0601)    PRN Meds: sodium chloride, acetaminophen, ALPRAZolam, benzonatate, diphenhydrAMINE, fentaNYL (SUBLIMAZE) injection, guaiFENesin-codeine, menthol-cetylpyridinium, ondansetron (ZOFRAN) IV, sodium chloride, sodium chloride  Palliative Performance Scale: 20% %  Vital Signs: BP 118/68 mmHg  Pulse 70  Temp(Src) 98.3 F (36.8 C) (Oral)  Resp 18  Ht  (1.753 m)  Wt 119.75 kg (264 lb)  BMI 38.97 kg/m2  SpO2 98% SpO2: SpO2: 98 % O2 Device: O2 Device: Nasal Cannula O2 Flow Rate: O2 Flow Rate (L/min): 3.5 L/min  Intake/output summary:  Intake/Output Summary (Last 24 hours) at 05/21/14 0955 Last data filed at 05/21/14 0442  Gross per 24 hour  Intake    130 ml  Output   1000 ml  Net   -870 ml   LBM:   Baseline Weight: Weight: 120.203 kg (265 lb) Most recent weight: Weight: 119.75 kg (264 lb)  Physical Exam: General: NAD, more restful today HEENT: [place]/AT, + JVD, moist mucous membranes CV: Irreg - A fib Respiratory: Slightly labored breathing, symmetric, room air Abdomen: Soft, NT, ND Extremities: MAE, warm to touch, BLE trace edema Skin: No lesions or open wounds Neuro/Psych: Lethargic, confused in speech when awake "get that cat off the pole"              Additional Data Reviewed: Recent Labs     05/19/14  0600  05/20/14  0555  NA  130*  131*  BUN  26*  29*  CREATININE  1.92*  1.81*     Problem List:  Patient Active Problem List   Diagnosis Date Noted  . Dyspnea 05/17/2014  . Ventricular tachycardia Jun 09, 2014  . Ventricular tachyarrhythmia 06-09-2014  . Atrial fibrillation [I48.91] 05/02/2014  . Gout flare 04/17/2014  . Slow transit constipation   . Physical debility 04/09/2014  . Left hemiparesis 04/09/2014  . Syncope and collapse 03/30/2014  . VT (ventricular tachycardia) 03/30/2014  . ICD discharge 03/30/2014  . Dyslexia 02/27/2014  . DNR (do not resuscitate) 02/22/2014  . Palliative care encounter 02/20/2014  . Shortness of breath 02/20/2014  . Sleep disturbance 02/20/2014  . Acute on chronic renal failure   . Cardiogenic shock   . Acute on chronic systolic CHF (congestive heart failure)  02/14/2014  . Noncompliance 05/02/2013  . Encounter for therapeutic drug monitoring 04/12/2013  . NSVT- Amiodarone added Feb 2016 02/28/2013  . Hypothyroid-elevated TSH on Amiodarone 02/27/2013  . Chronic anticoagulation 02/27/2013  . PAF-RFA  01/07/13 02/02/2013  . Acute on chronic clinical systolic heart failure 01/07/2013  . CKD (chronic kidney disease), stage III 08/09/2012  . 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 Non-ischemic  cardiomyopathy   . Chronic systolic heart failure   . Morbid obesity-BMI43   . Biventricular implantable cardioverter-defibrillator in situ 06/26/2009  . Hypertensive heart disease      Palliative Care Assessment & Plan    Code Status:  DNR   1. Symptom Management: Dyspnea: Agree with fentanyl 50 mcg every hour prn. May use fentanyl infusion at 50 mcg if he/family agrees. Morphine was causing itching.     2. Prognosis: Poor - likely days.   3. Discharge Planning: Likely hospital death.    Thank you for allowing the Palliative Medicine Team to assist in the care of this patient.   Time In: 0925 Time Out: 0945 Total Time Prolonged Time Billed  no     Greater than 50%  of this time was spent counseling and coordinating care related to the above assessment and plan.   Ulice Bold, NP  05/21/2014, 9:55 AM  Please contact Palliative Medicine Team phone at [phone] for questions and concerns.

## 2014-05-21 NOTE — Progress Notes (Signed)
Advanced Heart Failure Rounding Note   Subjective:    Admitted after multiple ICD shocks. K and mag supplemented. Mexilitene added.  Ongoing episodes of NSVT noted on tele.    CO-OX 40% on milrinone 0.375 mcg several days ago.   Was seen by Palliative Care team on 4/29. He told them he was dying. Wants to be DNR. But did not want to talk further about comfort care or ICD deactivation.   Yesterday started on morphine gtt and Foley placed. Has gotten IV lasix. Wanted morphine drip off yesterday. Now getting intermittent doses of Fentanyl for pain control. Sleepy at times but conversant. Breathing better.   Objective:   Weight Range:  Vital Signs:   Temp:  [97.5 F (36.4 C)-98.9 F (37.2 C)] 98.3 F (36.8 C) (05/03 0438) Pulse Rate:  [66-74] 70 (05/03 0438) Resp:  [16-19] 18 (05/03 0438) BP: (116-128)/(68-79) 118/68 mmHg (05/03 0438) SpO2:  [98 %-100 %] 98 % (05/03 0438) Weight:  [119.75 kg (264 lb)] 119.75 kg (264 lb) (05/03 0438) Last BM Date: 05/19/14  Weight change: Filed Weights   05/18/14 0437 05/19/14 0359 05/21/14 0438  Weight: 118.842 kg (262 lb) 117.935 kg (260 lb) 119.75 kg (264 lb)    Intake/Output:   Intake/Output Summary (Last 24 hours) at 05/21/14 1116 Last data filed at 05/21/14 0442  Gross per 24 hour  Intake    130 ml  Output   1000 ml  Net   -870 ml     Physical Exam: General:  Fatigued appearing. Dyspneic. Uncomfortable.  HEENT: normal Neck: supple. JVP jaw Carotids 2+ bilat; no bruits. No lymphadenopathy or thryomegaly appreciated. Cor: PMI laterally displaced. Regular rate & rhythm. +s3 Lungs: clear Abdomen: soft, obese nontender, nondistended. No hepatosplenomegaly. No bruits or masses. Good bowel sounds. Extremities: no cyanosis, clubbing, rash, 1+ edema Neuro: alert & orientedx3, cranial nerves grossly intact. Weak on left side . Affect pleasant  Telemetry:  NSR with brief runs of NSVT  Labs: Basic Metabolic Panel:  Recent Labs Lab  05/15/14 1545 05/16/14 1000 05/17/14 0450 05/18/14 0505 05/19/14 0600 05/20/14 0555  NA 133* 132* 130* 128* 130* 131*  K 3.9 4.6 4.9 4.3 4.0 3.8  CL 96 97 95* 94* 93* 92*  CO2 GLUCOSE 140* 127* 119* 135* 121* 124*  BUN 27* 26* 29*  CREATININE 1.61* 1.78* 1.97* 1.80* 1.92* 1.81*  CALCIUM 8.7 8.9 8.8 8.9 8.7* 8.8*  MG 2.4 2.3  --   --   --   --     Liver Function Tests: No results for input(s): AST, ALT, ALKPHOS, BILITOT, PROT, ALBUMIN in the last 168 hours.    No results for input(s): LIPASE, AMYLASE in the last 168 hours. No results for input(s): AMMONIA in the last 168 hours.  CBC:  Recent Labs Lab 05/18/14 0505  WBC 7.0  HGB 11.1*  HCT 34.7*  MCV 90.4  PLT 253    Cardiac Enzymes: No results for input(s): CKTOTAL, CKMB, CKMBINDEX, TROPONINI in the last 168 hours.  BNP: BNP (last 3 results)  Recent Labs  03/28/14 1827 03/30/14 1010 Jun 12, 2014 0820  BNP 590.5* 484.2* 1058.3*    ProBNP (last 3 results)  Recent Labs  12/26/13 1205 05/03/14 1035  PROBNP 4324.0* 629.0*      Other results:  Imaging: No results found.   Medications:     Scheduled Medications: . cyclobenzaprine  10 mg Oral BID  . furosemide  80 mg Intravenous  BID  . magnesium oxide  400 mg Oral Daily  . senna-docusate  2 tablet Oral QHS  . sodium chloride  3 mL Intravenous Q12H    Infusions: . milrinone 0.375 mcg/kg/min (05/21/14 0601)    PRN Medications: sodium chloride, acetaminophen, ALPRAZolam, benzonatate, diphenhydrAMINE, fentaNYL (SUBLIMAZE) injection, guaiFENesin-codeine, menthol-cetylpyridinium, ondansetron (ZOFRAN) IV, sodium chloride, sodium chloride   Assessment:  1. VT- multiple episodes. St Jude Device  2. Hypokalemia/Hypomagnesemia 3. Afib 4. End Stage HF with cardiogenic shock due to acute on chronic systolic heart failure- on home milrinone 0.375 mcg 5. Chronic A fib- on coumadin. CHADS2VASC score of 4 6. CKD Stage III-   7. DNR  Plan/Discussion:    He is actively dying from HF with recurrent VT. More comfortable after Foley placed and with intermittent doses of Fentanyl.  Family very concerned about his suffering. He agrees to trying a fentanyl drip but doesn't want ICD off yet.   Will continue milrinone for now. Continue IV lasix. Check labs tomorrow. Appreciate Palliative Care's help. Will continue to reassess day by day. He refuses SNF or [facility] at this point.    Length of Stay: 6  Bensimhon, Daniel MD  05/21/2014, 11:16 AM  Advanced Heart Failure Team Pager [phone] (M-F; 7a - 4p)  Please contact [facility] for night-coverage after hours (4p -7a ) and weekends on [email or web address]

## 2014-05-22 LAB — BASIC METABOLIC PANEL
Anion gap: 10 (ref 5–15)
BUN: 35 mg/dL — AB (ref 6–20)
CHLORIDE: 91 mmol/L — AB (ref 101–111)
CO2: 28 mmol/L (ref 22–32)
Calcium: 8.8 mg/dL — ABNORMAL LOW (ref 8.9–10.3)
Creatinine, Ser: 1.92 mg/dL — ABNORMAL HIGH (ref 0.61–1.24)
GFR calc Af Amer: 45 mL/min — ABNORMAL LOW (ref >60)
GFR calc non Af Amer: 39 mL/min — ABNORMAL LOW (ref >60)
GLUCOSE: 127 mg/dL — AB (ref 70–99)
POTASSIUM: 5 mmol/L (ref 3.5–5.1)
SODIUM: 129 mmol/L — AB (ref 135–145)

## 2014-05-22 LAB — PROTIME-INR
INR: 4.13 — AB (ref 0.00–1.49)
Prothrombin Time: 40.3 seconds — ABNORMAL HIGH (ref 11.6–15.2)

## 2014-05-22 NOTE — Progress Notes (Signed)
Daily Progress Note   Patient Name: Raymond [name]       Date: 05/22/2014 DOB: 1960/05/30  Age: 54 y.o. MRN#: [id number] Attending Physician: Dolores Patty, MD Primary Care Physician: Nilda Simmer, MD Admit Date: 05/13/2014   Subjective: [name] awakens briefly but does not speak with me but softly to his sister and then falls back to sleep. He seems much more comfortable on fentanyl infusion. His mother and sister are at bedside. I did speak with them and recommended ICD deactivation but he is still adamant about keeping this activated at this time. His sister says he told her if it fires not to touch him but to call his name and he will come back. I educated mother, sister, and RN about utilizing the magnet on code cart at end of life and repeated firing of ICD. His mother is very concerned about the trauma seeing his ICD fire will be to them as a family. Offered emotional support.    Length of Stay: 7 days  Current Medications: Scheduled Meds:  . cyclobenzaprine  10 mg Oral BID  . furosemide  80 mg Intravenous BID  . magnesium oxide  400 mg Oral Daily  . potassium chloride  40 mEq Oral BID  . senna-docusate  2 tablet Oral QHS  . sodium chloride  3 mL Intravenous Q12H    Continuous Infusions: . fentaNYL infusion INTRAVENOUS 50 mcg/hr (05/21/14 1231)  . milrinone 0.375 mcg/kg/min (05/22/14 0715)    PRN Meds: sodium chloride, acetaminophen, ALPRAZolam, benzonatate, diphenhydrAMINE, fentaNYL (SUBLIMAZE) injection, guaiFENesin-codeine, menthol-cetylpyridinium, ondansetron (ZOFRAN) IV, sodium chloride, sodium chloride  Palliative Performance Scale: 20%     Vital Signs: BP 104/60 mmHg  Pulse 70  Temp(Src) 97.9 F (36.6 C) (Oral)  Resp 18  Ht 5\' 9"  (1.753 m)  Wt 121.564 kg (268 lb)  BMI 39.56 kg/m2  SpO2 98% SpO2: SpO2: 98 % O2 Device: O2 Device: Nasal Cannula O2 Flow Rate: O2 Flow Rate (L/min): 4 L/min  Intake/output summary:  Intake/Output Summary (Last 24  hours) at 05/22/14 1140 Last data filed at 05/22/14 0700  Gross per 24 hour  Intake 1168.72 ml  Output   1100 ml  Net  68.72 ml   Baseline Weight: Weight: 120.203 kg (265 lb) Most recent weight: Weight: 121.564 kg (268 lb)  Physical Exam: General: NAD, more restful today HEENT: [place]/AT, + JVD, moist mucous membranes CV: Irreg - A fib Respiratory: No labored breathing, symmetric, room air Abdomen: Soft, NT, ND Extremities: MAE, warm to touch, BLE trace edema - worse today Skin: No lesions or open wounds Neuro/Psych: Lethargic, confused in speech at times when awake   Additional Data Reviewed: Recent Labs     05/20/14  0555  05/22/14  0438  NA  131*  129*  BUN  29*  35*  CREATININE  1.81*  1.92*     Problem List:  Patient Active Problem List   Diagnosis Date Noted  . Dyspnea 05/17/2014  . Ventricular tachycardia 05/12/2014  . Ventricular tachyarrhythmia 05/13/2014  . Atrial fibrillation [I48.91] 05/02/2014  . Gout flare 04/17/2014  . Slow transit constipation   . Physical debility 04/09/2014  . Left hemiparesis 04/09/2014  . Syncope and collapse 03/30/2014  . VT (ventricular tachycardia) 03/30/2014  . ICD discharge 03/30/2014  . Dyslexia 02/27/2014  . DNR (do not resuscitate) 02/22/2014  . Palliative care encounter 02/20/2014  . Shortness of breath 02/20/2014  . Sleep disturbance 02/20/2014  . Acute on chronic renal failure   .  Cardiogenic shock   . Acute on chronic systolic CHF (congestive heart failure) 02/14/2014  . Noncompliance 05/02/2013  . Encounter for therapeutic drug monitoring 04/12/2013  . NSVT- Amiodarone added Feb 2016 02/28/2013  . Hypothyroid-elevated TSH on Amiodarone 02/27/2013  . Chronic anticoagulation 02/27/2013  . PAF-RFA  01/07/13 02/02/2013  . Acute on chronic clinical systolic heart failure 01/07/2013  . CKD (chronic kidney disease), stage III 08/09/2012  . 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 Non-ischemic cardiomyopathy   . Chronic systolic heart failure   . Morbid obesity-BMI43   . Biventricular implantable cardioverter-defibrillator in situ 06/26/2009  . Hypertensive heart disease      Palliative Care Assessment & Plan    Code Status:  DNR   1. Symptom Management: Dyspnea: Continue fentanyl infusion at 50 mcg/hr.    2. Prognosis: Poor - likely days.   3. Discharge Planning: Likely hospital death.    Thank you for allowing the Palliative Medicine Team to assist in the care of this patient.   Time In: 1040 Time Out: 1100 Total Time Prolonged Time Billed  no     Greater than 50%  of this time was spent counseling and coordinating care related to the above assessment and plan.   Ulice Bold, NP  05/22/2014, 11:40 AM  Please contact Palliative Medicine Team phone at [phone] for questions and concerns.

## 2014-05-22 NOTE — Progress Notes (Deleted)
TR Band intact 1930 @ level 0 with old blood from Cath lab. No further bleeding or discomfort noted BP remain high, but trending down. TR Band air completely out @ 2010 & Band removed from R wrist @ 2120. No bleeding noted, 2 X 2 guaze and tegaderm applied.

## 2014-05-22 NOTE — Progress Notes (Signed)
Advanced Heart Failure Rounding Note   Subjective:    Admitted after multiple ICD shocks. K and mag supplemented. Mexilitene added.  Ongoing episodes of NSVT noted on tele.    CO-OX 40% on milrinone 0.375 mcg several days ago.   Was seen by Palliative Care team on 4/29. He told them he was dying. Wants to be DNR. But did not want to talk further about comfort care or ICD deactivation.   Yesterday started on Fentanyl.   Objective:   Weight Range:  Vital Signs:   Temp:  [97.7 F (36.5 C)-98.5 F (36.9 C)] 97.9 F (36.6 C) (05/04 0520) Pulse Rate:  [70-71] 70 (05/04 0520) Resp:  [18] 18 (05/04 0520) BP: (94-104)/(60-64) 104/60 mmHg (05/04 0520) SpO2:  [94 %-100 %] 98 % (05/04 0520) Weight:  [268 lb (121.564 kg)] 268 lb (121.564 kg) (05/04 0520) Last BM Date: 05/19/14  Weight change: Filed Weights   05/19/14 0359 05/21/14 0438 05/22/14 0520  Weight: 260 lb (117.935 kg) 264 lb (119.75 kg) 268 lb (121.564 kg)    Intake/Output:   Intake/Output Summary (Last 24 hours) at 05/22/14 0946 Last data filed at 05/22/14 0700  Gross per 24 hour  Intake 1168.72 ml  Output   1100 ml  Net  68.72 ml     Physical Exam: General:  Fatigued appearing. Dyspneic. Uncomfortable.  HEENT: normal Neck: supple. JVP jaw Carotids 2+ bilat; no bruits. No lymphadenopathy or thryomegaly appreciated. Cor: PMI laterally displaced. Regular rate & rhythm. +s3 Lungs: clear Abdomen: soft, obese nontender, nondistended. No hepatosplenomegaly. No bruits or masses. Good bowel sounds. Extremities: no cyanosis, clubbing, rash, 1+ edema Neuro: alert & orientedx3, cranial nerves grossly intact. Weak on left side . Affect pleasant  Telemetry:  NSR with brief runs of NSVT  Labs: Basic Metabolic Panel:  Recent Labs Lab 05/15/14 1545 05/16/14 1000 05/17/14 0450 05/18/14 0505 05/19/14 0600 05/20/14 0555 05/22/14 0438  NA 133* 132* 130* 128* 130* 131* 129*  K 3.9 4.6 4.9 4.3 4.0 3.8 5.0  CL 96 97 95*  94* 93* 92* 91*  CO2 GLUCOSE 140* 127* 119* 135* 121* 124* 127*  BUN 27* 26* 29* 35*  CREATININE 1.61* 1.78* 1.97* 1.80* 1.92* 1.81* 1.92*  CALCIUM 8.7 8.9 8.8 8.9 8.7* 8.8* 8.8*  MG 2.4 2.3  --   --   --   --   --     Liver Function Tests: No results for input(s): AST, ALT, ALKPHOS, BILITOT, PROT, ALBUMIN in the last 168 hours.    No results for input(s): LIPASE, AMYLASE in the last 168 hours. No results for input(s): AMMONIA in the last 168 hours.  CBC:  Recent Labs Lab 05/18/14 0505  WBC 7.0  HGB 11.1*  HCT 34.7*  MCV 90.4  PLT 253    Cardiac Enzymes: No results for input(s): CKTOTAL, CKMB, CKMBINDEX, TROPONINI in the last 168 hours.  BNP: BNP (last 3 results)  Recent Labs  03/28/14 1827 03/30/14 1010 05/08/2014 0820  BNP 590.5* 484.2* 1058.3*    ProBNP (last 3 results)  Recent Labs  12/26/13 1205 05/03/14 1035  PROBNP 4324.0* 629.0*      Other results:  Imaging: No results found.   Medications:     Scheduled Medications: . cyclobenzaprine  10 mg Oral BID  . furosemide  80 mg Intravenous BID  . magnesium oxide  400 mg Oral Daily  . potassium chloride  40 mEq Oral BID  .  senna-docusate  2 tablet Oral QHS  . sodium chloride  3 mL Intravenous Q12H    Infusions: . fentaNYL infusion INTRAVENOUS 50 mcg/hr (05/21/14 1231)  . milrinone 0.375 mcg/kg/min (05/22/14 0715)    PRN Medications: sodium chloride, acetaminophen, ALPRAZolam, benzonatate, diphenhydrAMINE, fentaNYL (SUBLIMAZE) injection, guaiFENesin-codeine, menthol-cetylpyridinium, ondansetron (ZOFRAN) IV, sodium chloride, sodium chloride   Assessment:  1. VT- multiple episodes. St Jude Device  2. Hypokalemia/Hypomagnesemia 3. Afib 4. End Stage HF with cardiogenic shock due to acute on chronic systolic heart failure- on home milrinone 0.375 mcg 5. Chronic A fib- on coumadin. CHADS2VASC score of 4 6. CKD Stage III-  7. DNR  Plan/Discussion:    He  is actively dying from HF with recurrent VT. More comfortable after Foley placed and with intermittent doses of Fentanyl.  Family very concerned about his suffering.   Continue fentanyl drip. Need to consider turning off ICD.    Will continue milrinone for now. Continue IV lasix.    Length of Stay: 7  CLEGG,AMY NP-C  05/22/2014, 9:46 AM  Advanced Heart Failure Team Pager [phone] (M-F; 7a - 4p)  Please contact [facility] for night-coverage after hours (4p -7a ) and weekends on [email or web address]   Patient seen and examined with Tonye Becket, NP. We discussed all aspects of the encounter. I agree with the assessment and plan as stated above.   Says he feels better with the fentanyl drip though still required several boluses over night. Breathing a bit better. Family remains very concerned over possibility of ICD shocks and other potential suffering. I had long talk with them and MR [name] about options. He flatly refuses SNF as his father dies in one. He is unsure if he wants his ICD turned off at this point but he wants to discuss with his family further today. Remains DNR otherwise. Will turn fentanyl gtt up to 75/hr to continue to keep him comfortable.   Bensimhon, Daniel,MD 12:10 PM

## 2014-05-22 NOTE — Progress Notes (Signed)
Utilization review completed.  

## 2014-05-23 LAB — PROTIME-INR
INR: 3.02 — AB (ref 0.00–1.49)
Prothrombin Time: 31.5 seconds — ABNORMAL HIGH (ref 11.6–15.2)

## 2014-05-23 LAB — GLUCOSE, CAPILLARY: GLUCOSE-CAPILLARY: 151 mg/dL — AB (ref 70–99)

## 2014-05-23 MED ORDER — PREDNISONE 20 MG PO TABS
40.0000 mg | ORAL_TABLET | Freq: Every day | ORAL | Status: AC
  Administered 2014-05-23 – 2014-05-25 (×3): 40 mg via ORAL
  Filled 2014-05-23 (×3): qty 2

## 2014-05-23 MED ORDER — PREDNISONE 20 MG PO TABS
40.0000 mg | ORAL_TABLET | Freq: Every day | ORAL | Status: DC
  Filled 2014-05-23: qty 2

## 2014-05-23 NOTE — Progress Notes (Signed)
[name] is sleeping today and does not awaken during my visit - I did not attempt to awaken him. Family at bedside tell me that he decided to deactivate his ICD this morning and this was a very difficult decision for him. I offered emotional support to them.   Yong Channel, NP Palliative Medicine Team Pager # [phone] (M-F 8a-5p) Team Phone # [phone] (Nights/Weekends)

## 2014-05-23 NOTE — Progress Notes (Signed)
Advanced Heart Failure Rounding Note   Subjective:    Admitted after multiple ICD shocks. K and mag supplemented. Mexilitene added.  Ongoing episodes of NSVT noted on tele.   On Fentanyl gtt. Lethargic at times. Awake at others. Several questions about ICD. Now wants to turn it off. Wants to stay in hospital to die. Denies dyspnea. + gout pain.   Objective:   Weight Range:  Vital Signs:   Temp:  [97.5 F (36.4 C)-97.9 F (36.6 C)] 97.9 F (36.6 C) (05/05 0427) Pulse Rate:  [68-70] 68 (05/05 0427) Resp:  [18] 18 (05/05 0427) BP: (126-133)/(74-90) 127/78 mmHg (05/05 0427) SpO2:  [92 %-100 %] 96 % (05/05 0427) Weight:  [122.018 kg (269 lb)] 122.018 kg (269 lb) (05/05 0427) Last BM Date: 05/19/14  Weight change: Filed Weights   05/21/14 0438 05/22/14 0520 05/23/14 0427  Weight: 119.75 kg (264 lb) 121.564 kg (268 lb) 122.018 kg (269 lb)    Intake/Output:   Intake/Output Summary (Last 24 hours) at 05/23/14 0937 Last data filed at 05/23/14 0600  Gross per 24 hour  Intake 941.93 ml  Output   1800 ml  Net -858.07 ml     Physical Exam: General:  Fatigued appearing. Lethargic at times HEENT: normal Neck: supple. JVP 8 Carotids 2+ bilat; no bruits. No lymphadenopathy or thryomegaly appreciated. Cor: PMI laterally displaced. Regular rate & rhythm. +s3 Lungs: clear Abdomen: soft, obese nontender, nondistended. No hepatosplenomegaly. No bruits or masses. Good bowel sounds. Extremities: no cyanosis, clubbing, rash, tr edema Neuro: alert & orientedx3, cranial nerves grossly intact. Weak on left side . Affect pleasant  Telemetry:  NSR with brief runs of NSVT  Labs: Basic Metabolic Panel:  Recent Labs Lab 05/16/14 1000 05/17/14 0450 05/18/14 0505 05/19/14 0600 05/20/14 0555 05/22/14 0438  NA 132* 130* 128* 130* 131* 129*  K 4.6 4.9 4.3 4.0 3.8 5.0  CL 97 95* 94* 93* 92* 91*  CO2 29 24 27 29 29 28   GLUCOSE 127* 119* 135* 121* 124* 127*  BUN 15 22 27* 26* 29* 35*   CREATININE 1.78* 1.97* 1.80* 1.92* 1.81* 1.92*  CALCIUM 8.9 8.8 8.9 8.7* 8.8* 8.8*  MG 2.3  --   --   --   --   --     Liver Function Tests: No results for input(s): AST, ALT, ALKPHOS, BILITOT, PROT, ALBUMIN in the last 168 hours.    No results for input(s): LIPASE, AMYLASE in the last 168 hours. No results for input(s): AMMONIA in the last 168 hours.  CBC:  Recent Labs Lab 05/18/14 0505  WBC 7.0  HGB 11.1*  HCT 34.7*  MCV 90.4  PLT 253    Cardiac Enzymes: No results for input(s): CKTOTAL, CKMB, CKMBINDEX, TROPONINI in the last 168 hours.  BNP: BNP (last 3 results)  Recent Labs  03/28/14 1827 03/30/14 1010 06/04/14 0820  BNP 590.5* 484.2* 1058.3*    ProBNP (last 3 results)  Recent Labs  12/26/13 1205 05/03/14 1035  PROBNP 4324.0* 629.0*      Other results:  Imaging: No results found.   Medications:     Scheduled Medications: . cyclobenzaprine  10 mg Oral BID  . furosemide  80 mg Intravenous BID  . magnesium oxide  400 mg Oral Daily  . potassium chloride  40 mEq Oral BID  . [START ON 05/24/2014] predniSONE  40 mg Oral Q breakfast  . senna-docusate  2 tablet Oral QHS  . sodium chloride  3 mL Intravenous Q12H  Infusions: . fentaNYL infusion INTRAVENOUS 75 mcg/hr (05/23/14 0417)  . milrinone 0.375 mcg/kg/min (05/23/14 0417)    PRN Medications: sodium chloride, acetaminophen, ALPRAZolam, benzonatate, diphenhydrAMINE, fentaNYL (SUBLIMAZE) injection, guaiFENesin-codeine, menthol-cetylpyridinium, ondansetron (ZOFRAN) IV, sodium chloride, sodium chloride   Assessment:  1. VT- multiple episodes. St Jude Device  2. Hypokalemia/Hypomagnesemia 3. Afib 4. End Stage HF with cardiogenic shock due to acute on chronic systolic heart failure- on home milrinone 0.375 mcg 5. Chronic A fib- on coumadin. CHADS2VASC score of 4 6. CKD Stage III-  7. DNR  Plan/Discussion:    He is actively dying from HF with recurrent VT. More comfortable after Foley  placed and with Fentanyl.  Family very concerned about his suffering.   Continue fentanyl drip will increase to 125 due to pain. Start prednisone for gout.Will turn off ICD at his request.   Will continue milrinone for now. Continue IV lasix.    Length of Stay: 8  Arvilla Meres MD  05/23/2014, 9:37 AM  Advanced Heart Failure Team Pager [phone] (M-F; 7a - 4p)  Please contact [facility] for night-coverage after hours (4p -7a ) and weekends on [email or web address]

## 2014-05-23 NOTE — Progress Notes (Signed)
Chaplain was referred to patient via spiritual care consult. When chaplain arrived, patient's mother and sister were outside of the patient's room. Patient's family wanted chaplain support and were glad when chaplain arrived. When chaplain arrived, preparations were being made to turn patient's ICD off. Patient's family said this was a major decision made by the patient this morning. Patient's family is struggling to relate with the patient at this time, as he is really agitated. Patient's sister had to walk out of the room while chaplain was present because she was upset at the patient's comments to her. Chaplain spoke to patient briefly. Patient said he understands that he is dying. Patient also said he felt like things were happening to fast and he felt rushed. Patient did ask chaplain about what it would be like to die and if the chaplain had any recommendations. Chaplain let patient know that death is experienced differently by different people but that what may be important is that he continue letting the people he loves know what he needs. Patient expressed that too many things were happening this morning and that he needed to collect his thoughts. Patient asked chaplain to come back next week. Chaplain consulted with patient's family after the visit. Patient's family wants to be supportive but they are having difficulties with the patient's anger and lashing out. Chaplain affirmed these feelings and also affirmed the patient's feelings. Chaplain let the patient's family know that the patient was feeling rushed and frustrated. Patient likely has a lot on his mind and is really angry that he is dying. Chaplain will continue to respect the space of the patient but will continue to be available if the patient does want to express his thoughts and feelings in the future. Chaplain will also continue to provide support for the patient's family during this time.  Javani Spratt, Tommi Emery, Chaplain  10:50 AM

## 2014-05-23 NOTE — Progress Notes (Signed)
The Chaplain responded to the request of the Nursing Staff to offer pastoral presence to the patient's family because of the need to help the patient make end of life decisions.  The family shared that the patient had been informed that his medical status was very serious regarding his progressive heart failure and other medical issues.  The family shared that patient is very concerned about making a decision about his overall medical progression, and he shares that fear with his family.  Chaplain offered a listening ear for the family  to process their concerns and fears concerning the patient's grave medical status. The patient was sleeping at the time of this visit but Chaplain  will follow-up with the patient, and a referral  will be given to the assigned unit Chaplain will be made. Chaplain Janell Quiet

## 2014-05-24 ENCOUNTER — Encounter: Payer: Self-pay | Admitting: Internal Medicine

## 2014-05-24 LAB — CUP PACEART REMOTE DEVICE CHECK
Battery Remaining Percentage: 32 %
Brady Statistic RA Percent Paced: 1 % — CL
HIGH POWER IMPEDANCE MEASURED VALUE: 46 Ohm
Lead Channel Sensing Intrinsic Amplitude: 12 mV
Lead Channel Setting Pacing Amplitude: 2 V
Lead Channel Setting Pacing Amplitude: 2.5 V
MDC IDC MSMT LEADCHNL LV IMPEDANCE VALUE: 710 Ohm
MDC IDC MSMT LEADCHNL RA IMPEDANCE VALUE: 360 Ohm
MDC IDC MSMT LEADCHNL RV IMPEDANCE VALUE: 400 Ohm
MDC IDC SESS DTM: 20160506163034
MDC IDC SET LEADCHNL LV PACING AMPLITUDE: 2 V
MDC IDC SET LEADCHNL LV PACING PULSEWIDTH: 0.5 ms
MDC IDC SET LEADCHNL RV PACING PULSEWIDTH: 0.5 ms
MDC IDC SET LEADCHNL RV SENSING SENSITIVITY: 0.5 mV
MDC IDC SET ZONE DETECTION INTERVAL: 455 ms
MDC IDC STAT BRADY RV PERCENT PACED: 76 %
Pulse Gen Serial Number: 610510
Zone Setting Detection Interval: 250 ms
Zone Setting Detection Interval: 280 ms

## 2014-05-24 LAB — BASIC METABOLIC PANEL
ANION GAP: 10 (ref 5–15)
BUN: 36 mg/dL — ABNORMAL HIGH (ref 6–20)
CO2: 28 mmol/L (ref 22–32)
Calcium: 9.1 mg/dL (ref 8.9–10.3)
Chloride: 93 mmol/L — ABNORMAL LOW (ref 101–111)
Creatinine, Ser: 1.9 mg/dL — ABNORMAL HIGH (ref 0.61–1.24)
GFR calc Af Amer: 46 mL/min — ABNORMAL LOW (ref >60)
GFR calc non Af Amer: 39 mL/min — ABNORMAL LOW (ref >60)
Glucose, Bld: 137 mg/dL — ABNORMAL HIGH (ref 70–99)
Potassium: 5.4 mmol/L — ABNORMAL HIGH (ref 3.5–5.1)
Sodium: 131 mmol/L — ABNORMAL LOW (ref 135–145)

## 2014-05-24 LAB — PROTIME-INR
INR: 2.25 — ABNORMAL HIGH (ref 0.00–1.49)
PROTHROMBIN TIME: 25 s — AB (ref 11.6–15.2)

## 2014-05-24 MED ORDER — SODIUM CHLORIDE 0.9 % IV SOLN
300.0000 ug/h | INTRAVENOUS | Status: DC
  Administered 2014-05-25 – 2014-05-28 (×5): 200 ug/h via INTRAVENOUS
  Administered 2014-05-29: 300 ug/h via INTRAVENOUS
  Administered 2014-05-29: 200 ug/h via INTRAVENOUS
  Administered 2014-05-30: 300 ug/h via INTRAVENOUS
  Filled 2014-05-24 (×10): qty 50

## 2014-05-24 MED ORDER — ALUM & MAG HYDROXIDE-SIMETH 200-200-20 MG/5ML PO SUSP
30.0000 mL | ORAL | Status: DC | PRN
  Administered 2014-05-24: 30 mL via ORAL

## 2014-05-24 NOTE — Progress Notes (Signed)
Advanced Heart Failure Rounding Note   Subjective:    Admitted after multiple ICD shocks. K and mag supplemented. Mexilitene added.  Ongoing episodes of NSVT noted on tele.   On Fentanyl gtt. Lethargic at times. Awake at others. Agitated at times. Having episodes of severe back pain. Gout pain better. ICD now off. Volume status stable on IV lasix. K 5.4. Potassium stopped.   Objective:   Weight Range:  Vital Signs:   Temp:  [97.4 F (36.3 C)-98.1 F (36.7 C)] 97.4 F (36.3 C) (05/06 0556) Pulse Rate:  [69-70] 70 (05/06 0556) Resp:  [18] 18 (05/06 0556) BP: (143-158)/(74-79) 158/79 mmHg (05/06 0556) SpO2:  [97 %-98 %] 98 % (05/06 0556) Weight:  [122.018 kg (269 lb)] 122.018 kg (269 lb) (05/06 0606) Last BM Date: 05/19/14  Weight change: Filed Weights   05/22/14 0520 05/23/14 0427 05/24/14 0606  Weight: 121.564 kg (268 lb) 122.018 kg (269 lb) 122.018 kg (269 lb)    Intake/Output:   Intake/Output Summary (Last 24 hours) at 05/24/14 1458 Last data filed at 05/24/14 0600  Gross per 24 hour  Intake 724.82 ml  Output   1600 ml  Net -875.18 ml     Physical Exam: General:  Fatigued appearing. Lethargic at times HEENT: normal Neck: supple. JVP 8 Carotids 2+ bilat; no bruits. No lymphadenopathy or thryomegaly appreciated. Cor: PMI laterally displaced. Regular rate & rhythm. +s3 Lungs: clear Abdomen: soft, obese nontender, nondistended. No hepatosplenomegaly. No bruits or masses. Good bowel sounds. Extremities: no cyanosis, clubbing, rash, tr edema Neuro: alert & orientedx3, cranial nerves grossly intact. Weak on left side . Affect pleasant  Telemetry:  NSR with brief runs of NSVT  Labs: Basic Metabolic Panel:  Recent Labs Lab 05/18/14 0505 05/19/14 0600 05/20/14 0555 05/22/14 0438 05/24/14 0515  NA 128* 130* 131* 129* 131*  K 4.3 4.0 3.8 5.0 5.4*  CL 94* 93* 92* 91* 93*  CO2 27 29 29 28 28   GLUCOSE 135* 121* 124* 127* 137*  BUN 27* 26* 29* 35* 36*  CREATININE  1.80* 1.92* 1.81* 1.92* 1.90*  CALCIUM 8.9 8.7* 8.8* 8.8* 9.1    Liver Function Tests: No results for input(s): AST, ALT, ALKPHOS, BILITOT, PROT, ALBUMIN in the last 168 hours.    No results for input(s): LIPASE, AMYLASE in the last 168 hours. No results for input(s): AMMONIA in the last 168 hours.  CBC:  Recent Labs Lab 05/18/14 0505  WBC 7.0  HGB 11.1*  HCT 34.7*  MCV 90.4  PLT 253    Cardiac Enzymes: No results for input(s): CKTOTAL, CKMB, CKMBINDEX, TROPONINI in the last 168 hours.  BNP: BNP (last 3 results)  Recent Labs  03/28/14 1827 03/30/14 1010 05/01/2014 0820  BNP 590.5* 484.2* 1058.3*    ProBNP (last 3 results)  Recent Labs  12/26/13 1205 05/03/14 1035  PROBNP 4324.0* 629.0*      Other results:  Imaging: No results found.   Medications:     Scheduled Medications: . furosemide  80 mg Intravenous BID  . predniSONE  40 mg Oral Q breakfast  . sodium chloride  3 mL Intravenous Q12H    Infusions: . fentaNYL infusion INTRAVENOUS 200 mcg/hr (05/24/14 1442)  . milrinone 0.375 mcg/kg/min (05/24/14 0909)    PRN Medications: sodium chloride, acetaminophen, ALPRAZolam, alum & mag hydroxide-simeth, benzonatate, diphenhydrAMINE, fentaNYL (SUBLIMAZE) injection, guaiFENesin-codeine, menthol-cetylpyridinium, ondansetron (ZOFRAN) IV, sodium chloride, sodium chloride   Assessment:  1. VT- multiple episodes. St Jude Device  2. Hypokalemia/Hypomagnesemia 3. Afib 4. End  Stage HF with cardiogenic shock due to acute on chronic systolic heart failure- on home milrinone 0.375 mcg 5. Chronic A fib- on coumadin. CHADS2VASC score of 4 6. CKD Stage III-  7. DNR  Plan/Discussion:    He is actively dying from HF with recurrent VT. More comfortable after Foley placed and with Fentanyl.  Family very concerned about his suffering.   Continue fentanyl drip will increase to 200/hr due to ongoing back pain and agitation. Continue prednisone x 3 days for gout  .  ICD is off at his request.   Will continue milrinone for now. Continue IV lasix. Continue comfort care.    Length of Stay: 9  Jaquetta Currier MD  05/24/2014, 2:58 PM  Advanced Heart Failure Team Pager [phone] (M-F; 7a - 4p)  Please contact [facility] for night-coverage after hours (4p -7a ) and weekends on [email or web address]

## 2014-05-24 NOTE — Care Management (Signed)
Medicare Important Message given? Yes (If Response is "NO", the following Medicare IM given date fields will be blank) Date Medicare IM given: 05/24/14 Medicare IM given by: Oluwadamilola Rosamond 

## 2014-05-24 NOTE — Progress Notes (Signed)
Chaplain responded to request from pt for a conversation and prayer.  Pt is anxious about the life transitions and family tensions.  Spoke with pt as he faded in and out.  Also spent extensive time with pt girlfriend and then Wendall Mola. Chaplain offered empathetic listening, religious text and prayer as sources of comfort.  Chaplain is available if the pt desires further conversation.   [employer], [address]

## 2014-05-24 NOTE — Progress Notes (Signed)
[name] awakens and greets me today. He seems more relaxed and much less anxious. Sister at bedside said he was upset earlier trying to settle financial concerns. RN states that he told her that he just wants to live through Mother's Day. Support provided to patient and family - no questions/concerns at this time. Please call [phone] with acute palliative needs over the weekend.   Yong Channel, NP Palliative Medicine Team Pager # [phone] (M-F 8a-5p) Team Phone # [phone] (Nights/Weekends)

## 2014-05-25 LAB — PROTIME-INR
INR: 1.71 — ABNORMAL HIGH (ref 0.00–1.49)
Prothrombin Time: 20.2 seconds — ABNORMAL HIGH (ref 11.6–15.2)

## 2014-05-25 LAB — BASIC METABOLIC PANEL
Anion gap: 9 (ref 5–15)
BUN: 40 mg/dL — AB (ref 6–20)
CALCIUM: 8.9 mg/dL (ref 8.9–10.3)
CO2: 29 mmol/L (ref 22–32)
CREATININE: 1.63 mg/dL — AB (ref 0.61–1.24)
Chloride: 90 mmol/L — ABNORMAL LOW (ref 101–111)
GFR calc Af Amer: 55 mL/min — ABNORMAL LOW (ref >60)
GFR calc non Af Amer: 47 mL/min — ABNORMAL LOW (ref >60)
GLUCOSE: 168 mg/dL — AB (ref 70–99)
Potassium: 4.5 mmol/L (ref 3.5–5.1)
Sodium: 128 mmol/L — ABNORMAL LOW (ref 135–145)

## 2014-05-25 NOTE — Progress Notes (Signed)
Patient ID: Raymond [name], male   DOB: 10-Jan-1961, 54 y.o.   MRN: [id number] Advanced Heart Failure Rounding Note   Subjective:    Admitted after multiple ICD shocks. K and mag supplemented. Mexilitene added.  Ongoing episodes of NSVT noted on tele.   On Fentanyl gtt. Lethargic at times. Awake at others. Agitated at times. Back pain better on higher Fentanyl dose. Gout pain better. ICD now off. Volume status stable on IV lasix. BMET today pending.   Objective:   Weight Range:  Vital Signs:   Temp:  [97.9 F (36.6 C)-98.6 F (37 C)] 98.6 F (37 C) (05/07 0602) Pulse Rate:  [70] 70 (05/07 0602) Resp:  [20] 20 (05/07 0602) BP: (128-140)/(70-78) 136/78 mmHg (05/07 0602) SpO2:  [96 %-99 %] 99 % (05/07 0602) Weight:  [270 lb (122.471 kg)] 270 lb (122.471 kg) (05/07 0602) Last BM Date: 05/19/14  Weight change: Filed Weights   05/23/14 0427 05/24/14 0606 05/25/14 0602  Weight: 269 lb (122.018 kg) 269 lb (122.018 kg) 270 lb (122.471 kg)    Intake/Output:   Intake/Output Summary (Last 24 hours) at 05/25/14 0932 Last data filed at 05/25/14 0914  Gross per 24 hour  Intake 936.84 ml  Output    600 ml  Net 336.84 ml     Physical Exam: General:  Fatigued appearing. Lethargic at times HEENT: normal Neck: supple. JVP 8 Carotids 2+ bilat; no bruits. No lymphadenopathy or thryomegaly appreciated. Cor: PMI laterally displaced. Regular rate & rhythm. +s3 Lungs: clear Abdomen: soft, obese nontender, nondistended. No hepatosplenomegaly. No bruits or masses. Good bowel sounds. Extremities: no cyanosis, clubbing, rash, tr edema Neuro: alert & orientedx3, cranial nerves grossly intact. Weak on left side . Affect pleasant  Telemetry:  NSR with brief runs of NSVT  Labs: Basic Metabolic Panel:  Recent Labs Lab 05/19/14 0600 05/20/14 0555 05/22/14 0438 05/24/14 0515  NA 130* 131* 129* 131*  K 4.0 3.8 5.0 5.4*  CL 93* 92* 91* 93*  CO2 29 29 28 28   GLUCOSE 121* 124* 127* 137*  BUN  26* 29* 35* 36*  CREATININE 1.92* 1.81* 1.92* 1.90*  CALCIUM 8.7* 8.8* 8.8* 9.1    Liver Function Tests: No results for input(s): AST, ALT, ALKPHOS, BILITOT, PROT, ALBUMIN in the last 168 hours.    No results for input(s): LIPASE, AMYLASE in the last 168 hours. No results for input(s): AMMONIA in the last 168 hours.  CBC: No results for input(s): WBC, NEUTROABS, HGB, HCT, MCV, PLT in the last 168 hours.  Cardiac Enzymes: No results for input(s): CKTOTAL, CKMB, CKMBINDEX, TROPONINI in the last 168 hours.  BNP: BNP (last 3 results)  Recent Labs  03/28/14 1827 03/30/14 1010 04/30/2014 0820  BNP 590.5* 484.2* 1058.3*    ProBNP (last 3 results)  Recent Labs  12/26/13 1205 05/03/14 1035  PROBNP 4324.0* 629.0*      Other results:  Imaging: No results found.   Medications:     Scheduled Medications: . furosemide  80 mg Intravenous BID  . sodium chloride  3 mL Intravenous Q12H    Infusions: . fentaNYL infusion INTRAVENOUS 200 mcg/hr (05/25/14 0746)  . milrinone 0.375 mcg/kg/min (05/25/14 0914)    PRN Medications: sodium chloride, acetaminophen, ALPRAZolam, alum & mag hydroxide-simeth, benzonatate, diphenhydrAMINE, fentaNYL (SUBLIMAZE) injection, guaiFENesin-codeine, menthol-cetylpyridinium, ondansetron (ZOFRAN) IV, sodium chloride, sodium chloride   Assessment:  1. VT- multiple episodes. St Jude Device  2. Hypokalemia/Hypomagnesemia 3. Afib 4. End Stage HF with cardiogenic shock due to acute on  chronic systolic heart failure- on home milrinone 0.375 mcg 5. Chronic A fib- on coumadin. CHADS2VASC score of 4 6. CKD Stage III-  7. DNR  Plan/Discussion:    He is actively dying from HF with recurrent VT. More comfortable after Foley placed and with Fentanyl.    Continue fentanyl drip at 200/hr due to ongoing back pain and agitation. Continue prednisone x 3 days for gout .  Pain better today but lethargic.  ICD is off at his request.   Will continue  milrinone for now. Continue IV lasix. Continue comfort care.   Length of Stay: 10  Marca Ancona MD  05/25/2014, 9:32 AM  Advanced Heart Failure Team Pager [phone] (M-F; 7a - 4p)  Please contact [facility] for night-coverage after hours (4p -7a ) and weekends on [email or web address]

## 2014-05-26 DIAGNOSIS — N179 Acute kidney failure, unspecified: Secondary | ICD-10-CM

## 2014-05-26 DIAGNOSIS — N189 Chronic kidney disease, unspecified: Secondary | ICD-10-CM

## 2014-05-26 LAB — BASIC METABOLIC PANEL
ANION GAP: 10 (ref 5–15)
BUN: 43 mg/dL — ABNORMAL HIGH (ref 6–20)
CALCIUM: 9.1 mg/dL (ref 8.9–10.3)
CHLORIDE: 90 mmol/L — AB (ref 101–111)
CO2: 30 mmol/L (ref 22–32)
CREATININE: 1.6 mg/dL — AB (ref 0.61–1.24)
GFR calc non Af Amer: 48 mL/min — ABNORMAL LOW (ref >60)
GFR, EST AFRICAN AMERICAN: 56 mL/min — AB (ref >60)
GLUCOSE: 144 mg/dL — AB (ref 70–99)
POTASSIUM: 5.1 mmol/L (ref 3.5–5.1)
Sodium: 130 mmol/L — ABNORMAL LOW (ref 135–145)

## 2014-05-26 LAB — PROTIME-INR
INR: 1.69 — ABNORMAL HIGH (ref 0.00–1.49)
PROTHROMBIN TIME: 20.1 s — AB (ref 11.6–15.2)

## 2014-05-26 NOTE — Progress Notes (Signed)
   05/26/14 0600  Clinical Encounter Type  Visited With Patient;Family;Health care provider  Visit Type Follow-up;Spiritual support;Social support;Patient actively dying  Referral From Family;Nurse  Spiritual Encounters  Spiritual Needs Prayer  Stress Factors  Patient Stress Factors Exhausted;Health changes;Loss of control;Major life changes  Family Stress Factors Exhausted;Health changes;Loss of control;Major life changes   Chaplain was paged to patient's room at 5:41 AM. Chaplain was notified that the patient and patient's family may need chaplain support. When chaplain arrived patient's sister was at patient's bedside reading him some Psalms from the Bible. Patient's sister excused herself and allowed chaplain to visit with patient alone. Patient said he saw the ghost of his father tonight. Chaplain asked if this scared him. Patient said it did not make him scared but gave him some comfort, since his father told him that he loved him. Chaplain asked patient if he was getting everything he needed. Patient said that he is getting everything he needed. Patient's primary concern tonight seemed to be that he was afraid that if he falls asleep he will die in his sleep. Patient said at this point he is "trying to hold on" and really wanted to make it through until Sunday night. Patient was also concerned with finding forgiveness and absolution for some of the things he has done wrong in his life. Chaplain asked patient if he would like to pray about these things. Patient said yes. Chaplain and patient prayed together. Chaplain let patient know it might make him feel better to get some rest. Patient continues to find chaplain visits helpful and chaplain visits also seem to be providing some respite for the family members who are adamant about not leaving the patient alone. Chaplain will continue to provide emotional and spiritual support for patient and patient's family as needed.  Cranston Neighbor,  Chaplain  6:23 AM

## 2014-05-26 NOTE — Progress Notes (Signed)
Patient ID: Raymond [name], male   DOB: Nov 17, 1960, 54 y.o.   MRN: [id number] Advanced Heart Failure Rounding Note   Subjective:    Admitted after multiple ICD shocks. K and mag supplemented. Mexilitene added.  Ongoing episodes of NSVT noted on tele.   On Fentanyl gtt. Lethargic at times. Awake at others. Agitated at times. Back pain better on higher Fentanyl dose. Gout pain better. ICD now off. Volume status stable on IV lasix. Labs stable.   Objective:   Weight Range:  Vital Signs:   Temp:  [98.6 F (37 C)] 98.6 F (37 C) (05/08 0433) Pulse Rate:  [70] 70 (05/08 0433) Resp:  [18] 18 (05/08 0433) BP: (109-129)/(65-80) 109/65 mmHg (05/08 0433) SpO2:  [95 %] 95 % (05/08 0433) Weight:  [282 lb (127.914 kg)] 282 lb (127.914 kg) (05/08 0433) Last BM Date: 05/19/14  Weight change: Filed Weights   05/24/14 0606 05/25/14 0602 05/26/14 0433  Weight: 269 lb (122.018 kg) 270 lb (122.471 kg) 282 lb (127.914 kg)    Intake/Output:   Intake/Output Summary (Last 24 hours) at 05/26/14 0936 Last data filed at 05/26/14 0808  Gross per 24 hour  Intake 787.32 ml  Output   1700 ml  Net -912.68 ml     Physical Exam: General:  Fatigued appearing. Lethargic at times HEENT: normal Neck: supple. JVP 8 Carotids 2+ bilat; no bruits. No lymphadenopathy or thryomegaly appreciated. Cor: PMI laterally displaced. Regular rate & rhythm. +s3 Lungs: clear Abdomen: soft, obese nontender, nondistended. No hepatosplenomegaly. No bruits or masses. Good bowel sounds. Extremities: no cyanosis, clubbing, rash, tr edema Neuro: alert & orientedx3, cranial nerves grossly intact. Weak on left side . Affect pleasant  Labs: Basic Metabolic Panel:  Recent Labs Lab 05/20/14 0555 05/22/14 0438 05/24/14 0515 05/25/14 1023 05/26/14 0502  NA 131* 129* 131* 128* 130*  K 3.8 5.0 5.4* 4.5 5.1  CL 92* 91* 93* 90* 90*  CO2 29 28 28 29 30   GLUCOSE 124* 127* 137* 168* 144*  BUN 29* 35* 36* 40* 43*  CREATININE  1.81* 1.92* 1.90* 1.63* 1.60*  CALCIUM 8.8* 8.8* 9.1 8.9 9.1    Liver Function Tests: No results for input(s): AST, ALT, ALKPHOS, BILITOT, PROT, ALBUMIN in the last 168 hours.    No results for input(s): LIPASE, AMYLASE in the last 168 hours. No results for input(s): AMMONIA in the last 168 hours.  CBC: No results for input(s): WBC, NEUTROABS, HGB, HCT, MCV, PLT in the last 168 hours.  Cardiac Enzymes: No results for input(s): CKTOTAL, CKMB, CKMBINDEX, TROPONINI in the last 168 hours.  BNP: BNP (last 3 results)  Recent Labs  03/28/14 1827 03/30/14 1010 05-31-14 0820  BNP 590.5* 484.2* 1058.3*    ProBNP (last 3 results)  Recent Labs  12/26/13 1205 05/03/14 1035  PROBNP 4324.0* 629.0*      Other results:  Imaging: No results found.   Medications:     Scheduled Medications: . furosemide  80 mg Intravenous BID  . sodium chloride  3 mL Intravenous Q12H    Infusions: . fentaNYL infusion INTRAVENOUS 200 mcg/hr (05/25/14 2303)  . milrinone 0.375 mcg/kg/min (05/26/14 0808)    PRN Medications: sodium chloride, acetaminophen, ALPRAZolam, alum & mag hydroxide-simeth, benzonatate, diphenhydrAMINE, fentaNYL (SUBLIMAZE) injection, guaiFENesin-codeine, menthol-cetylpyridinium, ondansetron (ZOFRAN) IV, sodium chloride, sodium chloride   Assessment:  1. VT- multiple episodes. St Jude Device  2. Hypokalemia/Hypomagnesemia 3. Afib 4. End Stage HF with cardiogenic shock due to acute on chronic systolic heart failure- on  home milrinone 0.375 mcg 5. Chronic A fib- on coumadin. CHADS2VASC score of 4 6. CKD Stage III-  7. DNR  Plan/Discussion:    He is dying from HF with recurrent VT. More comfortable after Foley placed and with Fentanyl.    Continue fentanyl drip at 200/hr due to ongoing back pain and agitation. Continue prednisone x 3 days for gout . Pain controlled today but lethargic.  ICD is off at his request.   Will continue milrinone for now. Continue IV  lasix. Continue comfort care.  Will need to involve palliative care service on Monday to look into our hospice options though he does not want to stop milrinone.   Length of Stay: 11  Marca Ancona MD  05/26/2014, 9:36 AM  Advanced Heart Failure Team Pager [phone] (M-F; 7a - 4p)  Please contact [facility] for night-coverage after hours (4p -7a ) and weekends on [email or web address]

## 2014-05-27 LAB — BASIC METABOLIC PANEL
ANION GAP: 9 (ref 5–15)
BUN: 34 mg/dL — ABNORMAL HIGH (ref 6–20)
CALCIUM: 8.5 mg/dL — AB (ref 8.9–10.3)
CO2: 30 mmol/L (ref 22–32)
Chloride: 90 mmol/L — ABNORMAL LOW (ref 101–111)
Creatinine, Ser: 1.35 mg/dL — ABNORMAL HIGH (ref 0.61–1.24)
GFR calc Af Amer: >60 mL/min (ref >60)
GFR, EST NON AFRICAN AMERICAN: 59 mL/min — AB (ref >60)
Glucose, Bld: 172 mg/dL — ABNORMAL HIGH (ref 70–99)
Potassium: 3.5 mmol/L (ref 3.5–5.1)
SODIUM: 129 mmol/L — AB (ref 135–145)

## 2014-05-27 LAB — PROTIME-INR
INR: 1.88 — AB (ref 0.00–1.49)
PROTHROMBIN TIME: 21.8 s — AB (ref 11.6–15.2)

## 2014-05-27 MED ORDER — MILRINONE IN DEXTROSE 20 MG/100ML IV SOLN
0.1250 ug/kg/min | INTRAVENOUS | Status: DC
  Administered 2014-05-27 – 2014-05-29 (×3): 0.125 ug/kg/min via INTRAVENOUS
  Filled 2014-05-27 (×2): qty 100

## 2014-05-27 NOTE — Progress Notes (Signed)
Patient ID: Raymond [name], male   DOB: 1960/04/12, 54 y.o.   MRN: [id number] Advanced Heart Failure Rounding Note   Subjective:    Admitted after multiple ICD shocks. K and mag supplemented. Mexilitene added.  Ongoing episodes of NSVT noted on tele.   On Fentanyl gtt. Pain controlled but has periods of anxiety and getting Xanax. Then drifts off to sleep. ICD now off. Volume status stable on IV lasix. Labs stable.   Objective:   Weight Range:  Vital Signs:   Temp:  [97.6 F (36.4 C)-98.6 F (37 C)] 97.6 F (36.4 C) (05/09 1635) Pulse Rate:  [72-104] 104 (05/09 1635) Resp:  [19-20] 19 (05/09 1635) BP: (110-118)/(51-75) 110/51 mmHg (05/09 1635) SpO2:  [95 %-97 %] 95 % (05/09 1635) Weight:  [126.554 kg (279 lb)] 126.554 kg (279 lb) (05/09 0440) Last BM Date: 05/19/14  Weight change: Filed Weights   05/25/14 0602 05/26/14 0433 05/27/14 0440  Weight: 122.471 kg (270 lb) 127.914 kg (282 lb) 126.554 kg (279 lb)    Intake/Output:   Intake/Output Summary (Last 24 hours) at 05/27/14 1819 Last data filed at 05/27/14 1604  Gross per 24 hour  Intake      0 ml  Output   2050 ml  Net  -2050 ml     Physical Exam: General:  Fatigued appearing. Lethargic HEENT: normal Neck: supple. JVP 8 Carotids 2+ bilat; no bruits. No lymphadenopathy or thryomegaly appreciated. Cor: PMI laterally displaced. Regular rate & rhythm. +s3 Lungs: clear Abdomen: soft, obese nontender, nondistended. No hepatosplenomegaly. No bruits or masses. Good bowel sounds. Extremities: no cyanosis, clubbing, rash, tr edema Neuro: alert & orientedx3, cranial nerves grossly intact. Weak on left side . Affect pleasant  Labs: Basic Metabolic Panel:  Recent Labs Lab 05/22/14 0438 05/24/14 0515 05/25/14 1023 05/26/14 0502 05/27/14 0451  NA 129* 131* 128* 130* 129*  K 5.0 5.4* 4.5 5.1 3.5  CL 91* 93* 90* 90* 90*  CO2 28 28 29 30 30   GLUCOSE 127* 137* 168* 144* 172*  BUN 35* 36* 40* 43* 34*  CREATININE 1.92*  1.90* 1.63* 1.60* 1.35*  CALCIUM 8.8* 9.1 8.9 9.1 8.5*    Liver Function Tests: No results for input(s): AST, ALT, ALKPHOS, BILITOT, PROT, ALBUMIN in the last 168 hours.    No results for input(s): LIPASE, AMYLASE in the last 168 hours. No results for input(s): AMMONIA in the last 168 hours.  CBC: No results for input(s): WBC, NEUTROABS, HGB, HCT, MCV, PLT in the last 168 hours.  Cardiac Enzymes: No results for input(s): CKTOTAL, CKMB, CKMBINDEX, TROPONINI in the last 168 hours.  BNP: BNP (last 3 results)  Recent Labs  03/28/14 1827 03/30/14 1010 05/17/2014 0820  BNP 590.5* 484.2* 1058.3*    ProBNP (last 3 results)  Recent Labs  12/26/13 1205 05/03/14 1035  PROBNP 4324.0* 629.0*      Other results:  Imaging: No results found.   Medications:     Scheduled Medications: . furosemide  80 mg Intravenous BID  . sodium chloride  3 mL Intravenous Q12H    Infusions: . fentaNYL infusion INTRAVENOUS 200 mcg/hr (05/27/14 0204)  . milrinone 0.125 mcg/kg/min (05/27/14 1443)    PRN Medications: sodium chloride, acetaminophen, ALPRAZolam, alum & mag hydroxide-simeth, benzonatate, diphenhydrAMINE, fentaNYL (SUBLIMAZE) injection, guaiFENesin-codeine, menthol-cetylpyridinium, ondansetron (ZOFRAN) IV, sodium chloride, sodium chloride   Assessment:  1. VT- multiple episodes. St Jude Device  2. Hypokalemia/Hypomagnesemia 3. Afib 4. End Stage HF with cardiogenic shock due to acute on chronic  systolic heart failure- on home milrinone 0.375 mcg 5. Chronic A fib- on coumadin. CHADS2VASC score of 4 6. CKD Stage III-  7. DNR  Plan/Discussion:    He is dying from HF with recurrent VT. More comfortable with Fentanyl.  Will continue at 200/hr. Talked to family about options and fact that milrinone may not be doing much at this point - will decrease milrinone to 0.125. May stop tomorrow. If off milrinone will be candidate for [facility].   Continue prn Xanax. Can switch to  versed gtt if he wants.   ICD is off at his request. VT quiescent.  D/w Yong Channel, NP-C with Palliative Care.  Length of Stay: 12  Arvilla Meres MD  05/27/2014, 6:19 PM  Advanced Heart Failure Team Pager [phone] (M-F; 7a - 4p)  Please contact [facility] for night-coverage after hours (4p -7a ) and weekends on [email or web address]

## 2014-05-27 NOTE — Progress Notes (Signed)
[name] is sleeping and appears comfortable. Family at bedside tell me he has episodes of anxiety - continue Xanax (appropriate to increase dose and/or frequency as needed for comfort in this case). They tell me that they have decided on trial of decreasing milrinone with hopes of potentially being able to d/c and move towards hospice facility. Agree with this transition. Sister is upset because she may not be able to continue to stay here past Thursday due to work and home responsibilities. I encouraged her that I believe he would be well cared for in hospice environment. She also says that she has been very happy with the care he has received here. Continue chaplain support - I believe these conversations have been very helpful to [name]. Discussed with Dr. Gala Romney.   Yong Channel, NP Palliative Medicine Team Pager # [phone] (M-F 8a-5p) Team Phone # [phone] (Nights/Weekends)

## 2014-05-28 MED ORDER — SORBITOL 70 % SOLN
30.0000 mL | Freq: Every day | Status: DC | PRN
  Filled 2014-05-28: qty 30

## 2014-05-28 MED ORDER — BISACODYL 10 MG RE SUPP: 10.0000 mg | Freq: Every day | RECTAL | Status: DC | PRN

## 2014-05-28 MED ORDER — LORAZEPAM 2 MG/ML IJ SOLN
2.0000 mg/h | INTRAVENOUS | Status: DC
  Administered 2014-05-28: 2 mg/h via INTRAVENOUS
  Administered 2014-05-29: 1 mg/h via INTRAVENOUS
  Filled 2014-05-28 (×3): qty 25

## 2014-05-28 NOTE — Progress Notes (Signed)
Daily Progress Note   Patient Name: Raymond [name]       Date: 05/28/2014 DOB: December 31, 1960  Age: 54 y.o. MRN#: [id number] Attending Physician: Dolores Patty, MD Primary Care Physician: Nilda Simmer, MD Admit Date: 26-May-2014  Subjective: [name] is sleeping and appears comfortable on fentanyl infusion. Mother is at bedside. Discussed with her that the next step would be to d/c milrinone and possibly transition to hospice facility. Mother understands and only wants him comfortable and not to suffer. She says that she wants him to rest and not to be awakened. Still having some periods of anxiety but she says he has been "nicer" the past 2 days. Offered support - did not awaken Raymond [name] per mother's request.    Length of Stay: 13 days  Current Medications: Scheduled Meds:  . furosemide  80 mg Intravenous BID  . sodium chloride  3 mL Intravenous Q12H    Continuous Infusions: . fentaNYL infusion INTRAVENOUS 200 mcg/hr (05/28/14 0856)  . milrinone 0.125 mcg/kg/min (05/28/14 0717)    PRN Meds: sodium chloride, acetaminophen, ALPRAZolam, alum & mag hydroxide-simeth, benzonatate, diphenhydrAMINE, fentaNYL (SUBLIMAZE) injection, guaiFENesin-codeine, menthol-cetylpyridinium, ondansetron (ZOFRAN) IV, sodium chloride, sodium chloride  Palliative Performance Scale: 20%     Vital Signs: BP 122/69 mmHg  Pulse 71  Temp(Src) 98.6 F (37 C) (Oral)  Resp 18  Ht 5\' 9"  (1.753 m)  Wt 124.286 kg (274 lb)  BMI 40.44 kg/m2  SpO2 98% SpO2: SpO2: 98 % O2 Device: O2 Device: Nasal Cannula O2 Flow Rate: O2 Flow Rate (L/min): 4 L/min  Intake/output summary:  Intake/Output Summary (Last 24 hours) at 05/28/14 1045 Last data filed at 05/28/14 0605  Gross per 24 hour  Intake   1350 ml  Output   1200 ml  Net    150 ml   Baseline Weight: Weight: 120.203 kg (265 lb) Most recent weight: Weight: 124.286 kg (274 lb)  Physical Exam: General: NAD, lying in bed HEENT: [place]/AT, + JVD, moist mucous  membranes CV: Irreg Respiratory: Breathing unlabored Extremities: Warm to touch, BLE trace edema Neuro/Psych: Sleeping - did not awaken   Additional Data Reviewed: Recent Labs     05/26/14  0502  05/27/14  0451  NA  130*  129*  BUN  43*  34*  CREATININE  1.60*  1.35*     Problem List:  Patient Active Problem List   Diagnosis Date Noted  . Dyspnea 05/17/2014  . Ventricular tachycardia 05/26/2014  . Ventricular tachyarrhythmia 26-May-2014  . Atrial fibrillation [I48.91] 05/02/2014  . Gout flare 04/17/2014  . Slow transit constipation   . Physical debility 04/09/2014  . Left hemiparesis 04/09/2014  . Syncope and collapse 03/30/2014  . VT (ventricular tachycardia) 03/30/2014  . ICD discharge 03/30/2014  . Dyslexia 02/27/2014  . DNR (do not resuscitate) 02/22/2014  . Palliative care encounter 02/20/2014  . Shortness of breath 02/20/2014  . Sleep disturbance 02/20/2014  . Acute on chronic renal failure   . Cardiogenic shock   . Acute on chronic systolic CHF (congestive heart failure) 02/14/2014  . Noncompliance 05/02/2013  . Encounter for therapeutic drug monitoring 04/12/2013  . NSVT- Amiodarone added Feb 2016 02/28/2013  . Hypothyroid-elevated TSH on Amiodarone 02/27/2013  . Chronic anticoagulation 02/27/2013  . PAF-RFA  01/07/13 02/02/2013  . Acute on chronic clinical systolic heart failure 01/07/2013  . CKD (chronic kidney disease), stage III 08/09/2012  . 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 Non-ischemic cardiomyopathy   .  Chronic systolic heart failure   . Morbid obesity-BMI43   . Biventricular implantable cardioverter-defibrillator in situ 06/26/2009  . Hypertensive heart disease      Palliative Care Assessment & Plan    Code Status:  DNR  Goals of Care:  Main goal is comfort.   Desire for further Chaplaincy support:yes  3. Symptom Management:  Continue fentanyl infusion at 200 mcg/hr. Bolus  fentanyl 50 mcg every hour prn - may increase frequency if needed.   Recommend Ativan 2 mg every 4 hours for continued anxiety.    5. Prognosis: Hours - Days  5. Discharge Planning: Possibly hospice facility - would recommend but would have to d/c milrinone.    Care plan was discussed with Tonye Becket, NP.   Thank you for allowing the Palliative Medicine Team to assist in the care of this patient.   Time In: 1020 Time Out: 1040 Total Time Prolonged Time Billed  no     Greater than 50%  of this time was spent counseling and coordinating care related to the above assessment and plan.   Ulice Bold, NP  05/28/2014, 10:45 AM  Please contact Palliative Medicine Team phone at [phone] for questions and concerns.

## 2014-05-28 NOTE — Progress Notes (Signed)
Nursing spoke with rapid response just to clarify if hanging ativan gtt with fentanyl was ok for a patient on 2[facility] unit. Rapid response states being that the patient is under palliative care, it would be ok to hang ativan gtt with fentanyl gtt and that they were compatible. Monitoring will continue.

## 2014-05-28 NOTE — Progress Notes (Signed)
Utilization review completed.  

## 2014-05-28 NOTE — Progress Notes (Signed)
Patient ID: Raymond [name], male   DOB: [birth year], 54 y.o.   MRN: [id number] Advanced Heart Failure Rounding Note   Subjective:    Admitted after multiple ICD shocks. K and mag supplemented. Mexilitene added.  Ongoing episodes of NSVT noted on tele.   Milrinone cut back to 0.125 yesterday. Remains on Fentanyl gtt. Pain controlled but has periods of anxiety and getting Xanax. Then drifts off to sleep. ICD now off.   Volume status stable on IV lasix. Labs stable. Feels weak.   Objective:   Weight Range:  Vital Signs:   Temp:  [97.6 F (36.4 C)-98.6 F (37 C)] 98.6 F (37 C) (05/10 0332) Pulse Rate:  [62-104] 71 (05/10 0332) Resp:  [18-19] 18 (05/10 0332) BP: (110-127)/(51-70) 122/69 mmHg (05/10 0332) SpO2:  [94 %-98 %] 98 % (05/10 0332) Weight:  [124.286 kg (274 lb)] 124.286 kg (274 lb) (05/10 0500) Last BM Date: 05/19/14  Weight change: Filed Weights   05/26/14 0433 05/27/14 0440 05/28/14 0500  Weight: 127.914 kg (282 lb) 126.554 kg (279 lb) 124.286 kg (274 lb)    Intake/Output:   Intake/Output Summary (Last 24 hours) at 05/28/14 1124 Last data filed at 05/28/14 0605  Gross per 24 hour  Intake   1350 ml  Output   1200 ml  Net    150 ml     Physical Exam: General:  Fatigued appearing. Lethargic HEENT: normal Neck: supple. JVP 8 Carotids 2+ bilat; no bruits. No lymphadenopathy or thryomegaly appreciated. Cor: PMI laterally displaced. Regular rate & rhythm. +s3 Lungs: clear Abdomen: soft, obese nontender, nondistended. No hepatosplenomegaly. No bruits or masses. Good bowel sounds. Extremities: no cyanosis, clubbing, rash, tr edema Neuro: alert & orientedx3, cranial nerves grossly intact. Weak on left side . Affect pleasant  Labs: Basic Metabolic Panel:  Recent Labs Lab 05/22/14 0438 05/24/14 0515 05/25/14 1023 05/26/14 0502 05/27/14 0451  NA 129* 131* 128* 130* 129*  K 5.0 5.4* 4.5 5.1 3.5  CL 91* 93* 90* 90* 90*  CO2 28 28 29 30 30   GLUCOSE 127* 137*  168* 144* 172*  BUN 35* 36* 40* 43* 34*  CREATININE 1.92* 1.90* 1.63* 1.60* 1.35*  CALCIUM 8.8* 9.1 8.9 9.1 8.5*    Liver Function Tests: No results for input(s): AST, ALT, ALKPHOS, BILITOT, PROT, ALBUMIN in the last 168 hours.    No results for input(s): LIPASE, AMYLASE in the last 168 hours. No results for input(s): AMMONIA in the last 168 hours.  CBC: No results for input(s): WBC, NEUTROABS, HGB, HCT, MCV, PLT in the last 168 hours.  Cardiac Enzymes: No results for input(s): CKTOTAL, CKMB, CKMBINDEX, TROPONINI in the last 168 hours.  BNP: BNP (last 3 results)  Recent Labs  03/28/14 1827 03/30/14 1010 05/07/2014 0820  BNP 590.5* 484.2* 1058.3*    ProBNP (last 3 results)  Recent Labs  12/26/13 1205 05/03/14 1035  PROBNP 4324.0* 629.0*      Other results:  Imaging: No results found.   Medications:     Scheduled Medications: . furosemide  80 mg Intravenous BID  . sodium chloride  3 mL Intravenous Q12H    Infusions: . fentaNYL infusion INTRAVENOUS 200 mcg/hr (05/28/14 0856)  . milrinone 0.125 mcg/kg/min (05/28/14 0717)    PRN Medications: sodium chloride, acetaminophen, ALPRAZolam, alum & mag hydroxide-simeth, benzonatate, diphenhydrAMINE, fentaNYL (SUBLIMAZE) injection, guaiFENesin-codeine, menthol-cetylpyridinium, ondansetron (ZOFRAN) IV, sodium chloride, sodium chloride   Assessment:  1. VT- multiple episodes. St Jude Device  2. Hypokalemia/Hypomagnesemia 3. Afib 4. End  Stage HF with cardiogenic shock due to acute on chronic systolic heart failure- on home milrinone 0.375 mcg 5. Chronic A fib- on coumadin. CHADS2VASC score of 4 6. CKD Stage III-  7. DNR  Plan/Discussion:    He is dying from HF with recurrent VT. More comfortable with Fentanyl.  Will continue at 200/hr. Milrinone now at 0.125.  Continues with periods of anxiety. We discussed using prn Xanax or ativan gtt. He favors gtt. Will move to 6N.   ICD is off at his request. VT  quiescent.  D/w with Palliative Care team.  Length of Stay: 13  Arvilla Meres MD  05/28/2014, 11:24 AM  Advanced Heart Failure Team Pager [phone] (M-F; 7a - 4p)  Please contact [facility] for night-coverage after hours (4p -7a ) and weekends on [email or web address]

## 2014-05-28 NOTE — Patient Outreach (Signed)
[facility]) Care Management  05/28/2014  Raymond [name] [birth year] [id number]  Discharge note:  Patient was admitted on 05/13/2014 before scheduled home visit scheduled for 05/05/2014.  Patient remains admitted however has been transferred to hospice care.   Will close case at this time.    Will notify MD and other [facility] team members.   Rowe Pavy, RN, BSN, CEN [facility] [phone]

## 2014-05-29 MED ORDER — ATROPINE SULFATE 1 % OP SOLN
4.0000 [drp] | OPHTHALMIC | Status: DC | PRN
  Filled 2014-05-29: qty 2

## 2014-05-29 NOTE — Progress Notes (Signed)
Nursing staff updated sister about pt's current status, plan of care and requests from mother. Family was educated upon disease process and each specific medication that has been ordered for the patient. Sister is upset that pt is not alert and responsive. Family member feels like medication is making him die. Sister was updated on input from cardiology as well as palliative care. Sister keeps asking about Xanax order,mother told palltiative care NP she wants pt to be comfortable. Sister requested that Ativan gttp be titrated twice during 2300-0700. Gttp is currently going at 1 mg/hr, ordered rate is 2mg/hr. Sister said that she understands disease process and purpose of each medication. Will continue to monitor. .  Cashtyn Pouliot M    

## 2014-05-29 NOTE — Progress Notes (Signed)
Bed became available at [facility], patient's family wishes for him to remain here at [facility]. Spoke to Amy, NP and this is ok.

## 2014-05-29 NOTE — Progress Notes (Signed)
   05/29/14 1500  Clinical Encounter Type  Visited With Patient and family together  Visit Type Follow-up;Psychological support;Spiritual support;Patient actively dying  Referral From Family  Spiritual Encounters  Spiritual Needs Prayer;Grief support  Stress Factors  Family Stress Factors Loss   Chaplain was paged to was paged to patient's room at 3:04 PM.  When chaplain arrived patient's sister was at bedside. Unlike prior visits, the patient is no longer able to communicate. Patient's sister communicated to chaplain that the patient is likely going to pass away really soon. Patient's sister asked for prayer at bedside. Chaplain prayed with patient and patient's family. Patient appears comfortable and well supported during this time. Patient's family has music playing next to the patient and they have given him a wrench to hold on to, as he was a long time Teacher, early years/pre. Chaplain let patient's sister know that he is available if they need support later today.  Cranston Neighbor, Chaplain  3:26 PM

## 2014-05-29 NOTE — Progress Notes (Signed)
Patient ID: Raymond [name], male   DOB: [birth year], 54 y.o.   MRN: [id number] Advanced Heart Failure Rounding Note   Subjective:    Admitted after multiple ICD shocks. K and mag supplemented. Mexilitene added.  Ongoing episodes of NSVT noted on tele.   Milrinone cut back to 0.125 yesterday. Remains on Fentanyl gtt. Pain controlled but has periods of anxiety and getting Xanax. Then drifts off to sleep. ICD now off.   Yesterday started on ativan drip. Sedated.     Objective:   Weight Range:  Vital Signs:   Temp:  [98.5 F (36.9 C)] 98.5 F (36.9 C) (05/10 1416) Pulse Rate:  [70-71] 71 (05/11 0842) Resp:  [18] 18 (05/10 1416) BP: (113-128)/(75-80) 113/75 mmHg (05/11 0842) SpO2:  [95 %] 95 % (05/10 1416) Last BM Date: 05/19/14  Weight change: Filed Weights   05/26/14 0433 05/27/14 0440 05/28/14 0500  Weight: 282 lb (127.914 kg) 279 lb (126.554 kg) 274 lb (124.286 kg)    Intake/Output:   Intake/Output Summary (Last 24 hours) at 05/29/14 1021 Last data filed at 05/29/14 0651  Gross per 24 hour  Intake      0 ml  Output   3400 ml  Net  -3400 ml     Physical Exam: General:  Sedated. Does not open eyes. Family at bedside.  HEENT: normal Neck: supple. JVP to jaw. Carotids 2+ bilat; no bruits. No lymphadenopathy or thryomegaly appreciated. Cor: PMI laterally displaced. Regular rate & rhythm. +s3 Lungs: clear Abdomen: soft, obese nontender, nondistended. No hepatosplenomegaly. No bruits or masses. Good bowel sounds. Extremities: no cyanosis, clubbing, rash, tr edema Neuro: Sedated does not open eyes.   Labs: Basic Metabolic Panel:  Recent Labs Lab 05/24/14 0515 05/25/14 1023 05/26/14 0502 05/27/14 0451  NA 131* 128* 130* 129*  K 5.4* 4.5 5.1 3.5  CL 93* 90* 90* 90*  CO2 GLUCOSE 137* 168* 144* 172*  BUN 36* 40* 43* 34*  CREATININE 1.90* 1.63* 1.60* 1.35*  CALCIUM 9.1 8.9 9.1 8.5*    Liver Function Tests: No results for input(s): AST, ALT,  ALKPHOS, BILITOT, PROT, ALBUMIN in the last 168 hours.    No results for input(s): LIPASE, AMYLASE in the last 168 hours. No results for input(s): AMMONIA in the last 168 hours.  CBC: No results for input(s): WBC, NEUTROABS, HGB, HCT, MCV, PLT in the last 168 hours.  Cardiac Enzymes: No results for input(s): CKTOTAL, CKMB, CKMBINDEX, TROPONINI in the last 168 hours.  BNP: BNP (last 3 results)  Recent Labs  03/28/14 1827 03/30/14 1010 06/04/2014 0820  BNP 590.5* 484.2* 1058.3*    ProBNP (last 3 results)  Recent Labs  12/26/13 1205 05/03/14 1035  PROBNP 4324.0* 629.0*      Other results:  Imaging: No results found.   Medications:     Scheduled Medications: . furosemide  80 mg Intravenous BID  . sodium chloride  3 mL Intravenous Q12H    Infusions: . fentaNYL infusion INTRAVENOUS 200 mcg/hr (05/29/14 0105)  . LORazepam (ATIVAN) infusion 1.5 mg/hr (05/29/14 0951)  . milrinone 0.125 mcg/kg/min (05/29/14 0438)    PRN Medications: sodium chloride, acetaminophen, ALPRAZolam, alum & mag hydroxide-simeth, benzonatate, bisacodyl, diphenhydrAMINE, fentaNYL (SUBLIMAZE) injection, guaiFENesin-codeine, menthol-cetylpyridinium, ondansetron (ZOFRAN) IV, sodium chloride, sodium chloride, sorbitol   Assessment:  1. VT- multiple episodes. St Jude Device  2. Hypokalemia/Hypomagnesemia 3. Afib 4. End Stage HF with cardiogenic shock due to acute on chronic systolic heart failure- on home milrinone 0.375  mcg 5. Chronic A fib- on coumadin. CHADS2VASC score of 4 6. CKD Stage III-  7. DNR  Plan/Discussion:    He is dying from HF with recurrent VT. Sedated on Fentanyl + ativan drip. Stop Milrinone  0.125. Stop all non essential medications.   ICD is off at his request. VT quiescent.  D/w with Palliative Care team.  Length of Stay: 14  CLEGG,AMY NP-C 05/29/2014, 10:21 AM  Advanced Heart Failure Team Pager [phone] (M-F; 7a - 4p)  Please contact [facility] for  night-coverage after hours (4p -7a ) and weekends on [email or web address]  Patient seen and examined with Tonye Becket, NP. We discussed all aspects of the encounter. I agree with the assessment and plan as stated above.   He is comfortable on fentanyl and ativan gtt. Family at bedside. Suspect he will pass in next 24 hours.   Celestial Barnfield,MD 6:53 PM

## 2014-05-29 NOTE — Progress Notes (Signed)
Daily Progress Note   Patient Name: Raymond [name]       Date: 05/29/2014 DOB: 12-24-1960  Age: 54 y.o. MRN#: [id number] Attending Physician: Dolores Patty, MD Primary Care Physician: Nilda Simmer, MD Admit Date: 05/23/2014   Subjective: [name] is lying in bed. Does not awaken to voice or soft tactile stimulation. Bilat toes cyanosis and cold to touch. Sister, Victorino December, along with her husband and Mr. Rondell Reams girlfriend are at bedside. Lola is upset that he is so sedate. I explained that he will continue to be more and more lethargic at end of life and eat less and less. Discussed that his feet are cold and explained further signs of progression they might see at end of life. Support given as family was able to share fond memories of [name] (apparently he is an avid lover of [employer]VC) - this seems to be very therapeutic for them.    Length of Stay: 14 days  Current Medications: Scheduled Meds:     Continuous Infusions: . fentaNYL infusion INTRAVENOUS 300 mcg/hr (05/29/14 1042)  . LORazepam (ATIVAN) infusion 2 mg/hr (05/29/14 1122)    PRN Meds: ondansetron (ZOFRAN) IV  Palliative Performance Scale: 10%     Vital Signs: BP 113/75 mmHg  Pulse 71  Temp(Src) 98.5 F (36.9 C) (Oral)  Resp 18  Ht  (1.753 m)  Wt 124.286 kg (274 lb)  BMI 40.44 kg/m2  SpO2 95% SpO2: SpO2: 95 % O2 Device: O2 Device: Nasal Cannula O2 Flow Rate: O2 Flow Rate (L/min): 4 L/min  Intake/output summary:  Intake/Output Summary (Last 24 hours) at 05/29/14 1520 Last data filed at 05/29/14 0651  Gross per 24 hour  Intake      0 ml  Output   1300 ml  Net  -1300 ml   Baseline Weight: Weight: 120.203 kg (265 lb) Most recent weight: Weight: 124.286 kg (274 lb)  Physical Exam: General: NAD, lying in bed HEENT: [place]/AT, + JVD, moist mucous membranes CV: Irreg Respiratory: Breathing unlabored but irreg, no apnea noted Extremities: Bilat feet cold to touch, BLE trace edema Neuro/Psych: Min  responsive  Additional Data Reviewed: Recent Labs     05/27/14  0451  NA  129*  BUN  34*  CREATININE  1.35*     Problem List:  Patient Active Problem List   Diagnosis Date Noted  . Dyspnea 05/17/2014  . Ventricular tachycardia 05/23/2014  . Ventricular tachyarrhythmia 2014-05-23  . Atrial fibrillation [I48.91] 05/02/2014  . Gout flare 04/17/2014  . Slow transit constipation   . Physical debility 04/09/2014  . Left hemiparesis 04/09/2014  . Syncope and collapse 03/30/2014  . VT (ventricular tachycardia) 03/30/2014  . ICD discharge 03/30/2014  . Dyslexia 02/27/2014  . DNR (do not resuscitate) 02/22/2014  . Palliative care encounter 02/20/2014  . Shortness of breath 02/20/2014  . Sleep disturbance 02/20/2014  . Acute on chronic renal failure   . Cardiogenic shock   . Acute on chronic systolic CHF (congestive heart failure) 02/14/2014  . Noncompliance 05/02/2013  . Encounter for therapeutic drug monitoring 04/12/2013  . NSVT- Amiodarone added Feb 2016 02/28/2013  . Hypothyroid-elevated TSH on Amiodarone 02/27/2013  . Chronic anticoagulation 02/27/2013  . PAF-RFA  01/07/13 02/02/2013  . Acute on chronic clinical systolic heart failure 01/07/2013  . CKD (chronic kidney disease), stage III 08/09/2012  . 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 Non-ischemic cardiomyopathy   . Chronic systolic heart failure   .  Morbid obesity-BMI43   . Biventricular implantable cardioverter-defibrillator in situ 06/26/2009  . Hypertensive heart disease      Palliative Care Assessment & Plan   Code Status:  DNR  Goals of Care:  Main goal is comfort.   Desire for further Chaplaincy support:yes  3. Symptom Management:  Pain/dyspnea: Continue fentanyl infusion at 200 mcg/hr. Bolus fentanyl 50 mcg every hour prn - may increase frequency if needed. Fentanyl increased to 300 mcg/hr - would recommend utilization of bolus prior to increase of  basal rate.    Anxiety: Recommend Ativan 2 mg every 4 hours for continued anxiety.  4. Prognosis: Hours - Days  5. Discharge Planning: Likely hospital death.   Care plan was discussed with Tonye Becket, NP  Thank you for allowing the Palliative Medicine Team to assist in the care of this patient.   Time In: 1130 Time Out: 1200 Total Time Prolonged Time Billed  no     Greater than 50%  of this time was spent counseling and coordinating care related to the above assessment and plan.   Ulice Bold, NP  05/29/2014, 3:20 PM  Please contact Palliative Medicine Team phone at [phone] for questions and concerns.      Yong Channel, NP Palliative Medicine Team Pager # [phone] (M-F 8a-5p) Team Phone # [phone] (Nights/Weekends)

## 2014-05-30 ENCOUNTER — Encounter: Payer: Self-pay | Admitting: Cardiology

## 2014-05-31 ENCOUNTER — Encounter: Payer: Self-pay | Admitting: Internal Medicine

## 2014-06-03 ENCOUNTER — Ambulatory Visit: Payer: Self-pay | Admitting: Cardiology

## 2014-06-03 DIAGNOSIS — Z5181 Encounter for therapeutic drug level monitoring: Secondary | ICD-10-CM

## 2014-06-03 DIAGNOSIS — I4891 Unspecified atrial fibrillation: Secondary | ICD-10-CM

## 2014-06-06 ENCOUNTER — Encounter: Payer: Self-pay | Admitting: Internal Medicine

## 2014-06-10 ENCOUNTER — Ambulatory Visit: Payer: Medicare Other | Admitting: Family Medicine

## 2014-06-14 ENCOUNTER — Encounter: Payer: Self-pay | Admitting: Internal Medicine

## 2014-06-19 NOTE — Progress Notes (Signed)
Wasted 10cc of Fentanyl IV bag with [employer] in sink.

## 2014-06-19 NOTE — Progress Notes (Signed)
06/15/2014 9:59 AM Nursing note Post mortem care provided to patient with assistance of Autumn Kopacz RN and Ivor Messier and [employer]. Pt. Sister Victorino December stayed present at bedside per request during post mortem care. PICC line d/c along with foley as pt. Family does not want autopsy. Pt. Released to care of [facility] and [employer]. Emotional support provided to family. Shyleigh Daughtry, Blanchard Kelch

## 2014-06-19 NOTE — Progress Notes (Signed)
   05/22/2014 1000  Clinical Encounter Type  Visited With Family  Visit Type Follow-up;Death  Spiritual Encounters  Spiritual Needs Grief support  Stress Factors  Family Stress Factors Loss   Chaplain was notified this morning that the patient had passed away and that family was still present. Chaplain checked in with patient's sister. Patient's sister was a little teary but said that they were fine and did not have any needs at this time. Patient passed away surrounded by the support of family and friends.  Cranston Neighbor, Chaplain  10:48 AM

## 2014-06-19 NOTE — Progress Notes (Signed)
05/27/2014 0930 Received call from pt. Mother Talbert Forest upset due to the fact that she did not receive a call from staff regarding her son's passing this morning. Emotional support and condolences offered over the phone. Upon speaking with her daughter, Victorino December at bedside, she stated that no staff contacted her mother per her request.  She stated that she wanted someone no notify her mother of her brother's passing in person.  She stated she had a family member notify her mother instead of our staff per family request. Department leadership notified of family concerns.   Jon, Blanchard Kelch

## 2014-06-19 NOTE — Discharge Summary (Signed)
Advanced Heart Failure Team  Discharge Summary/Death Summary    Patient ID: Raymond [name] MRN: [id number], DOB/AGE: 01-25-1960 54 y.o. Admit date: 04/20/2014 D/C date:     06/19/14   Primary Discharge Diagnoses:  1. VT- multiple episodes. [employer] Device  2. Hypokalemia/Hypomagnesemia 3. Afib 4. End Stage HF with cardiogenic shock due to acute on chronic systolic heart failure- on home milrinone 0.375 mcg 5. Chronic A fib- on coumadin. CHADS2VASC score of 4 6. CKD Stage III-  7. DNR 8. 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 BIVICD, CKD, hypothyroidism on home milrinone and recurrent ICD shocks for VT. He was admitted back in January with cardiogenic shock requiring two pressors for diuresis and March/early April for hypokalemia and VT/ICD. He was not a candidate for advanced therapies due to CKD and social situation.    Admitted with cough and recurrent ICD shocks. Magnesium and potassium replaced. EP consulted with recommendations to continue amiodarone and add mexiletine. On April 26th he had polymorphic VT with another ICD shock.  Despite potassium and magnesium replacement he had recurrent NSVT.   He developed volume overload on on April 28th and was diuresed with IV lasix with little improvement. [name] turned off milrinone on April 29th due to alarms and his mixed venous saturation went down to 40%. INR on admit was 2.3 and rose to  7.61 April 29th with coumadin stopped. He then refused diuretics despite dyspnea at rest. On April 29th palliative care consulted as he had end stage heart failure with recurrent VT and he was refusing diuretics. At that conclusion of the meeting he elected DNR but did not want full comfort care or to deactivate ICD.  On May 2nd he agreed to morphine drip for increased dyspnea but on May 3rd he asked morphine drip to be stopped. He  agreed to intermittent fentanyl. Due to increased dyspnea he agreed to IV lasix but had poor response. He had ongoing episode of NSVT.    Lengthy discussion occurred with him and family regarding his declining decondition. Palliative Care met with him and his family and on May 5th he requested to turn off ICD. He was placed on fentanyl drip for increased dyspnea and and back pain. At that point milrinone was weaned as he was not receiving any benefit. Due to ongoing decline ativan drip was added for anxiety. On May 12 th he passed quietly with his family at bedside.     Duration of Discharge Encounter: Greater than 35 minutes   Signed, CLEGG,AMY NP-C  19-Jun-2014   Agreed.  Annamary Buschman,MD 10:55 PM

## 2014-06-19 NOTE — Patient Outreach (Signed)
[facility]) Care Management  05/28/2014  Zaylor Jaquish 10-Jan-1961 [id number]   Received notification from Rowe Pavy, RN to close case patient now in [facility].  Corrie Mckusick. [facility] Care Management [facility] CM Assistant Phone: [phone] Fax: [phone]

## 2014-06-19 NOTE — Progress Notes (Addendum)
Patient expired at 512 and pronounced expired by this Clinical research associate and Museum/gallery curator. Notified Palliative on call and Cardiology on call to make aware and to complete death certificate. Spoke with representative at [facility], Bonna Gains, Reference Number # [id number]. Sister at bedside.   Palliative on call is not in building, was told to notify attending. Notified Cardiology on call, was told to notify Attending who will be present at 730 and was told that Cardiology fellow is not available at this time. Notified Charge Nurse accordingly.

## 2014-06-19 NOTE — Progress Notes (Signed)
Patient passed this am with family at his bedside.   I sat with them and talked for a bit.  Death certificate signed.   Lanee Chain,MD 8:18 AM

## 2014-06-19 DEATH — deceased

## 2014-11-14 IMAGING — CR DG UGI W/ HIGH DENSITY W/KUB
1 series · 1 of 1 positions shown · non-contrast
Comparison: None.

FLUOROSCOPY TIME:  1 min 7 seconds

CLINICAL DATA: Chronic cough.  History of stroke.

EXAM:
UPPER GI SERIES W/HIGH DENSITY W/KUB
TECHNIQUE: After obtaining a scout radiograph a routine upper GI series was
performed using thin and high density barium.

[view not recorded]
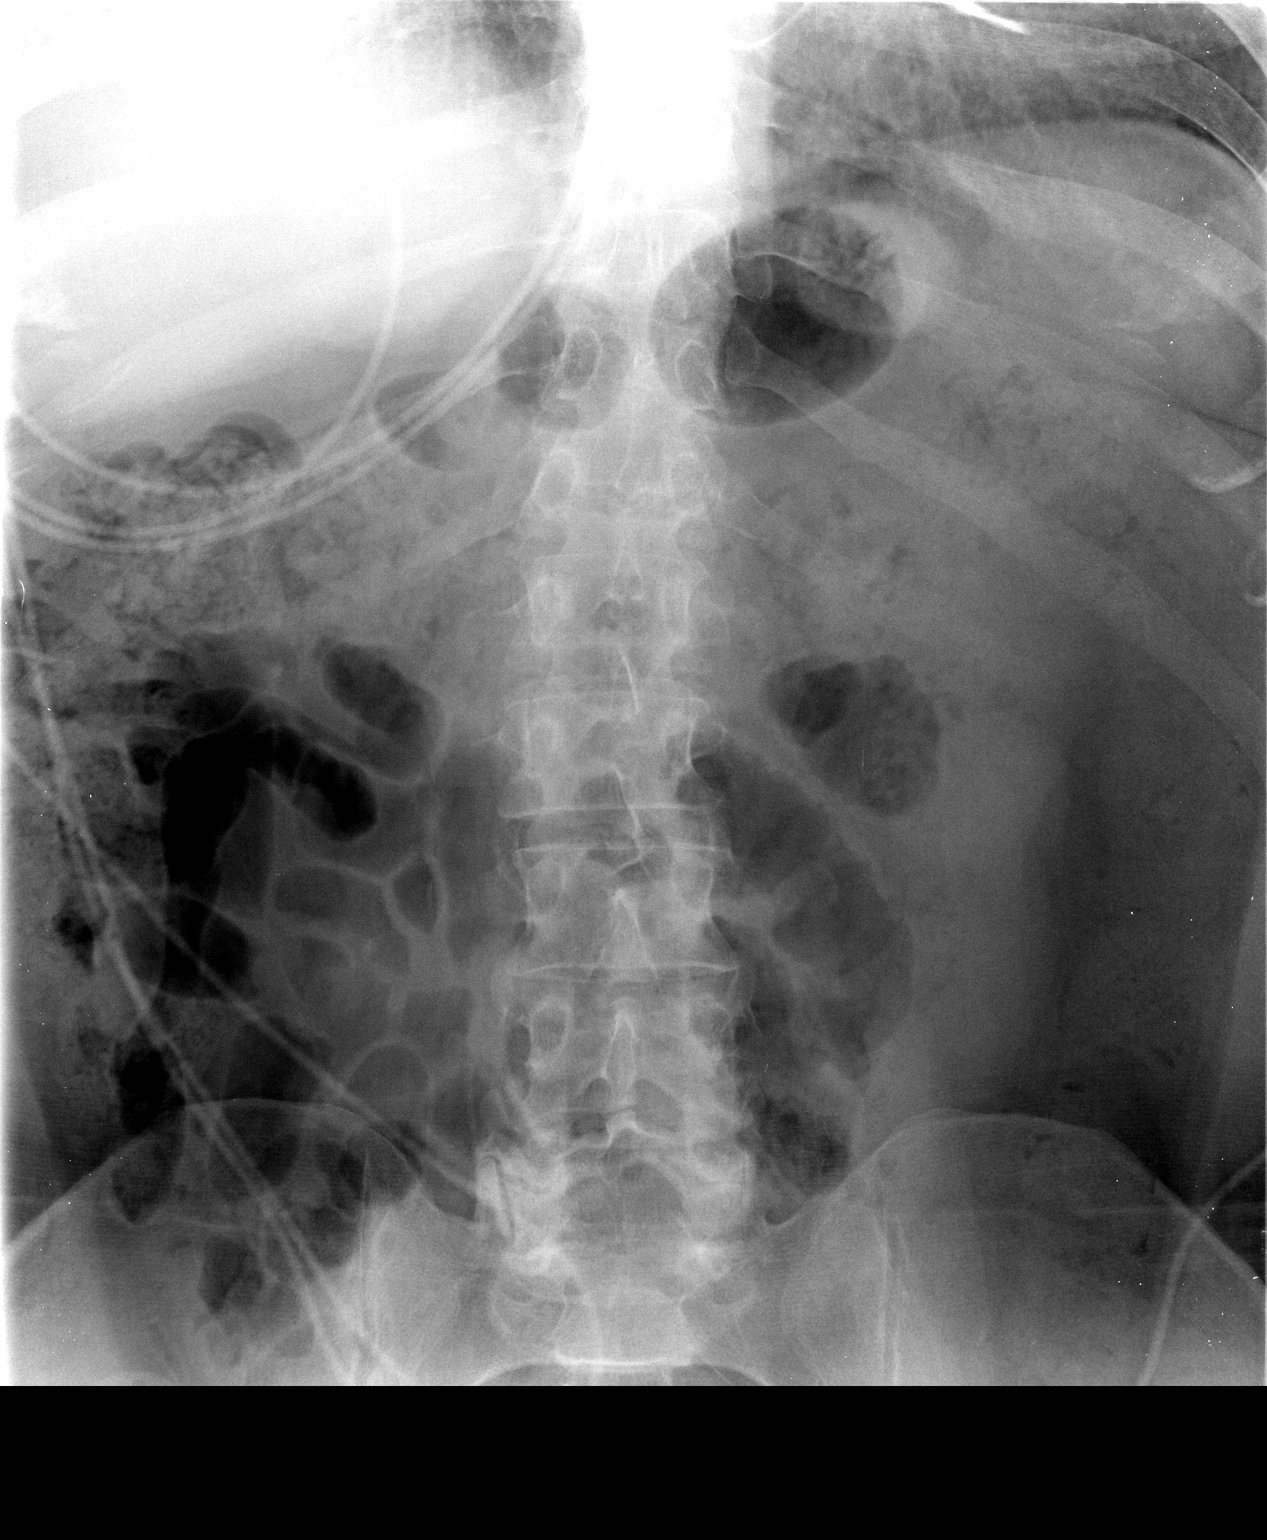

[1 of 1 positions shown; findings below may reference images not displayed]

FINDINGS: The mucosa and motility of the esophagus are normal. There is no
hiatal hernia or spontaneous gastroesophageal reflux. There is no
visible aspiration.

The stomach, pylorus, duodenal bulb, and duodenal C-loop appear
normal.
IMPRESSION: Normal upper GI. Specifically, no evidence of hiatal hernia or
gastroesophageal reflux.

## 2015-12-11 IMAGING — CR DG CHEST 2V
2 series · 2 of 2 positions shown · non-contrast
Comparison: 02/18/2014

CLINICAL DATA: Shortness of breath since last night.

EXAM:
CHEST  2 VIEW

[chest pa]
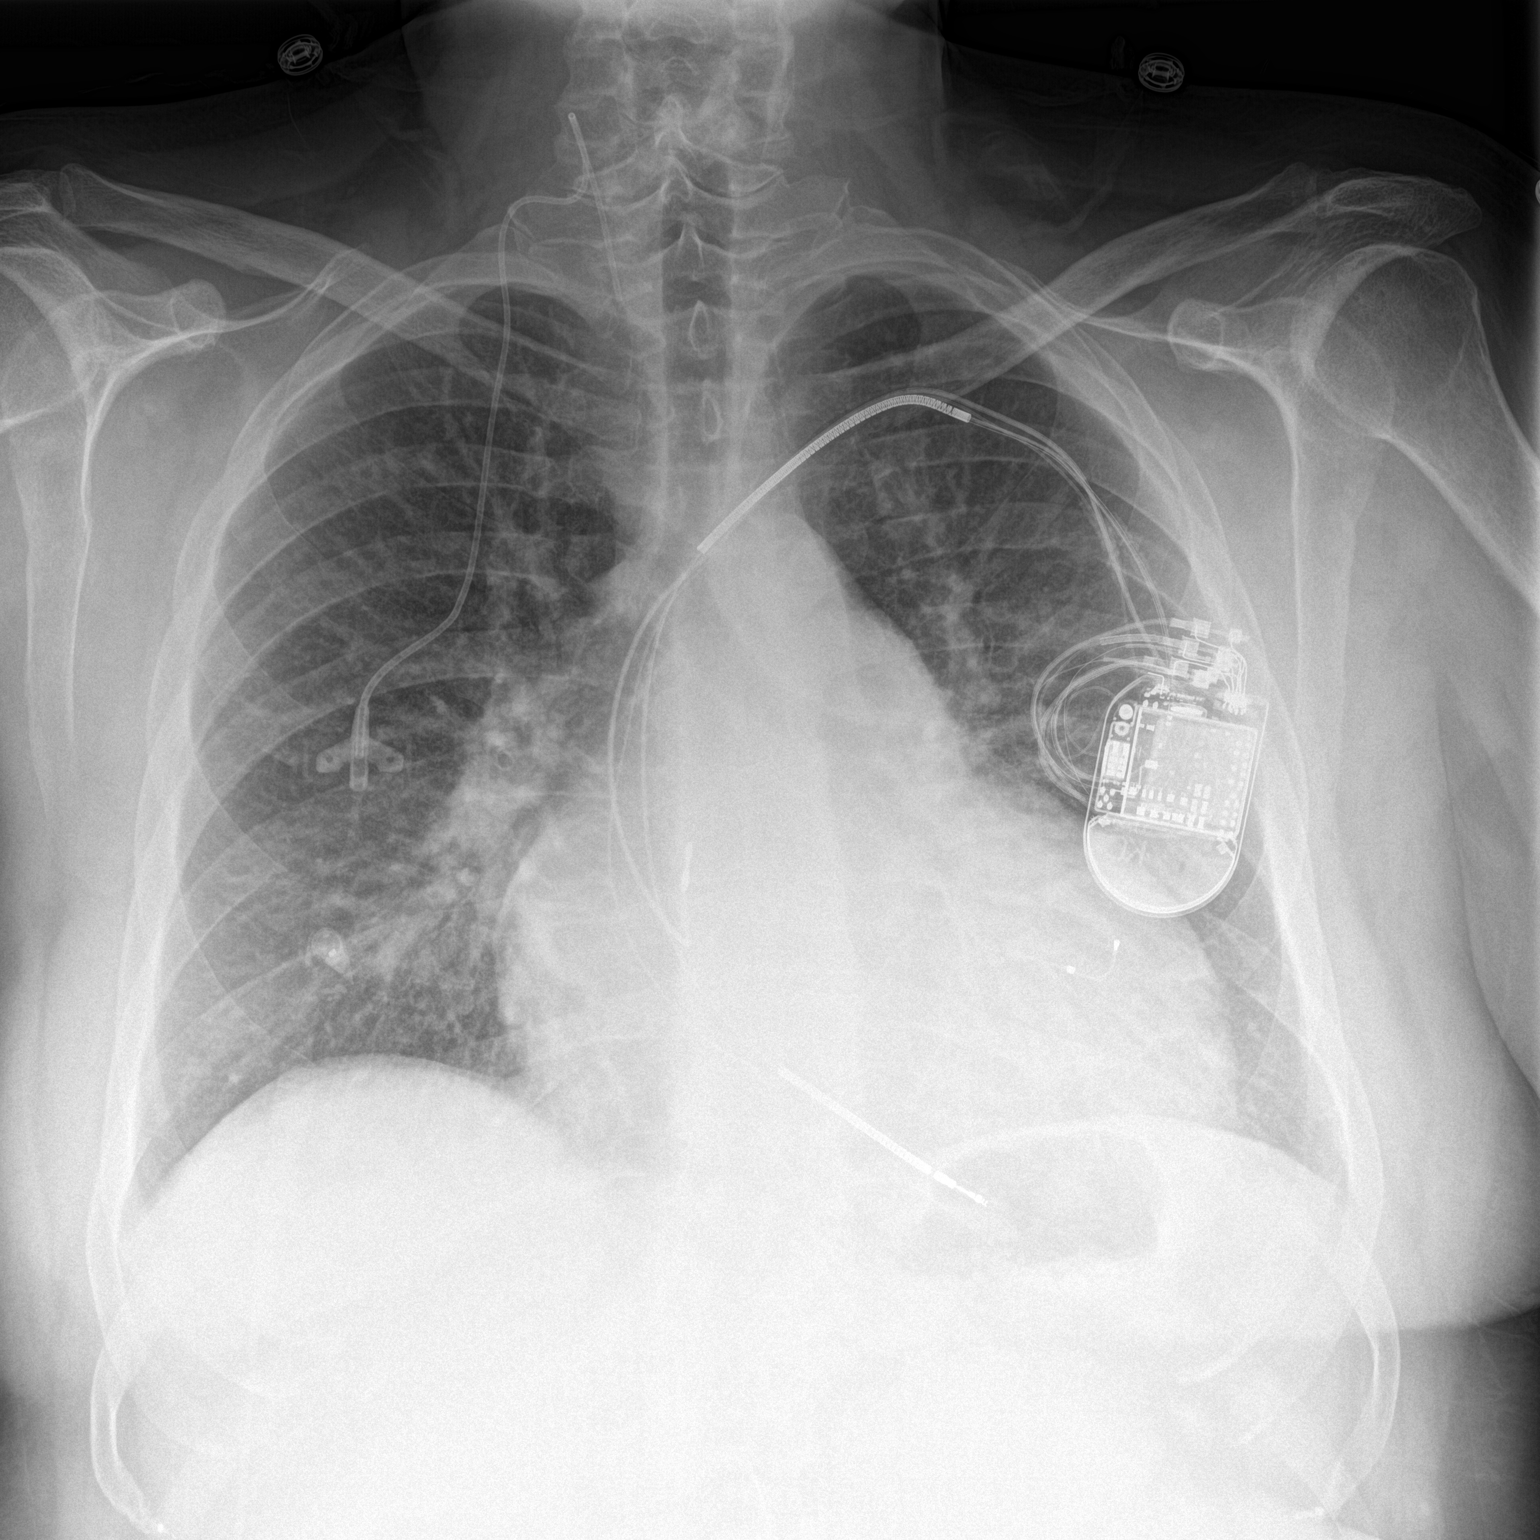

[chest lat]
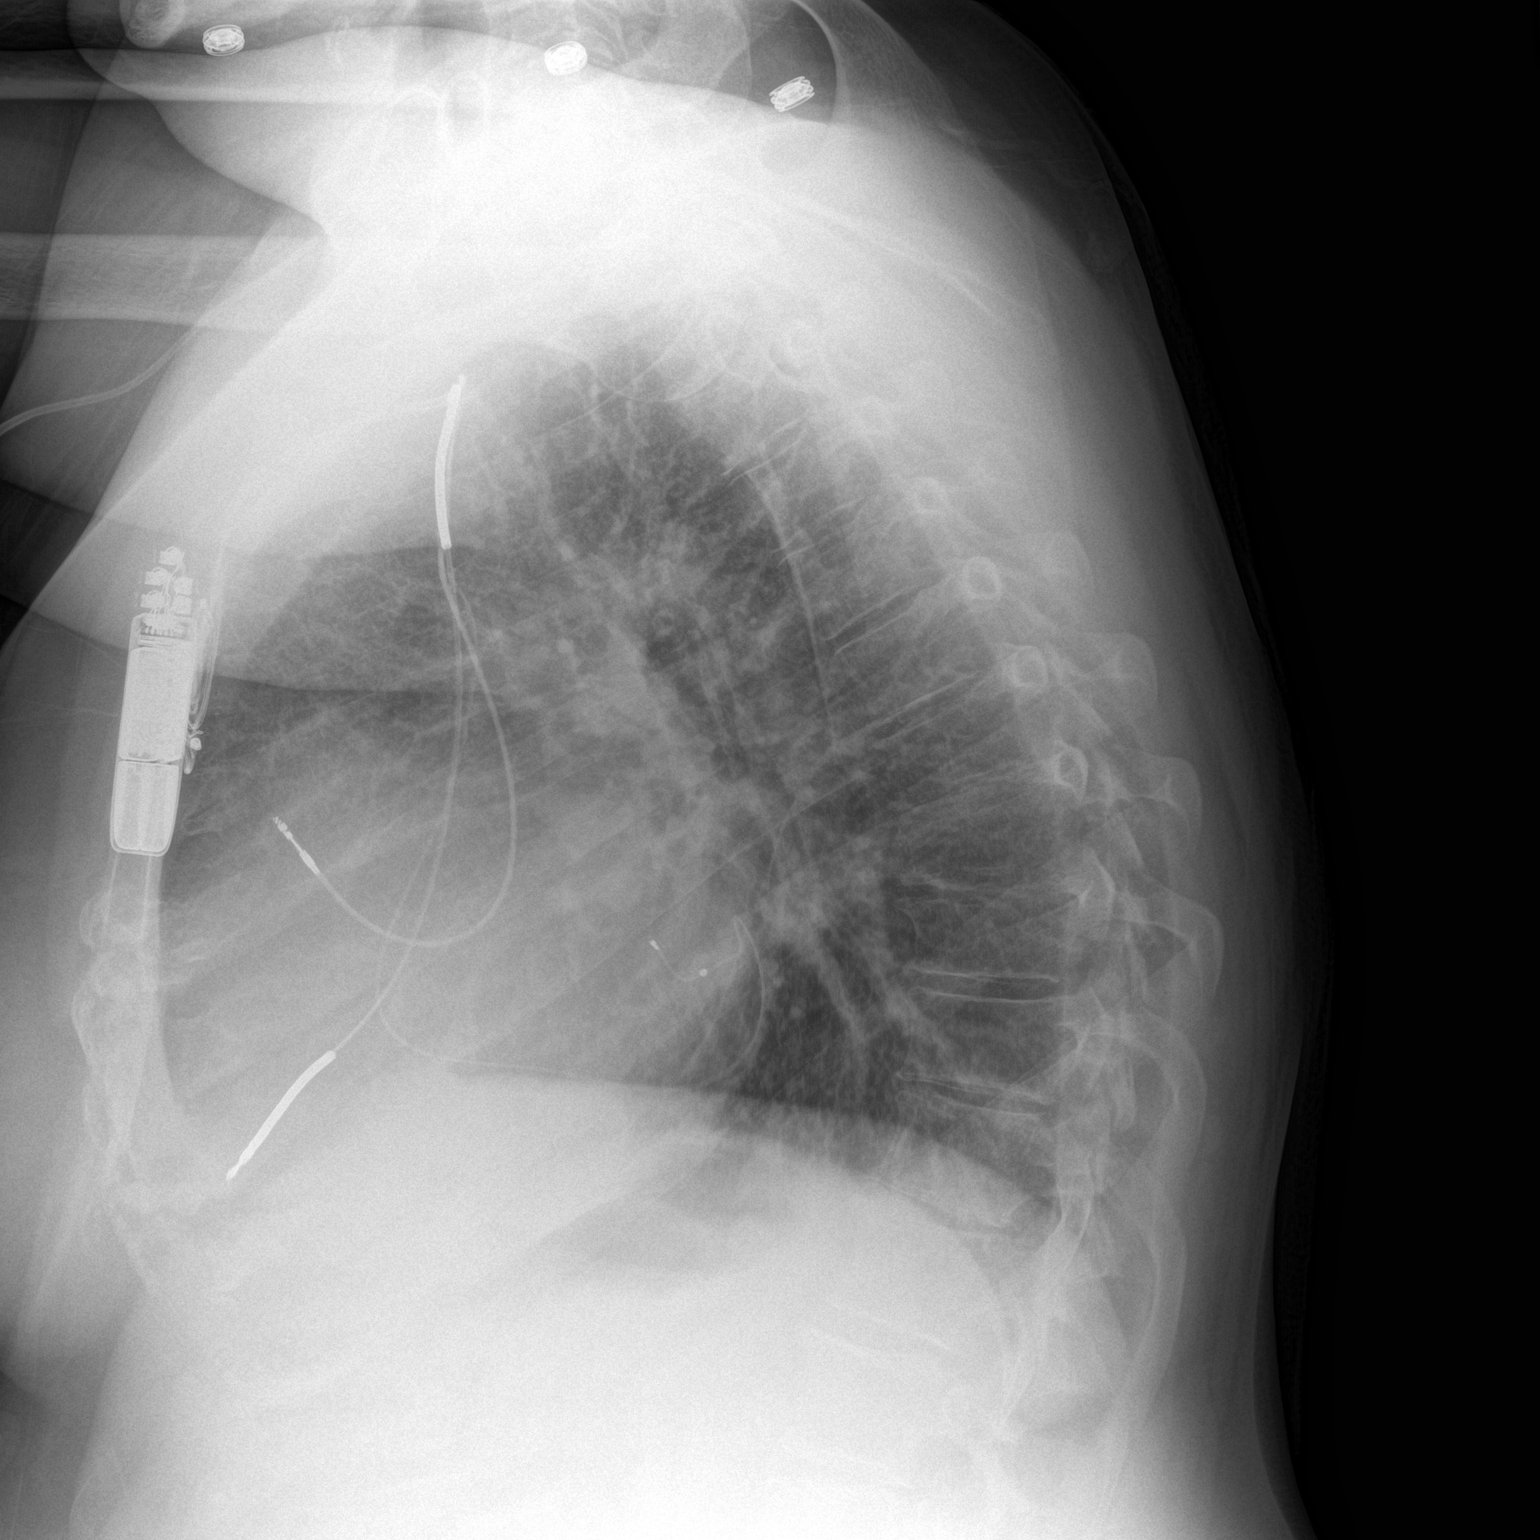

[2 of 2 positions shown; findings below may reference images not displayed]

FINDINGS: Pacer/AICD device.

A right internal jugular line terminates over the internal jugular
vein. Midline trachea. Moderate cardiomegaly. No pleural effusion or
pneumothorax. Mild interstitial edema. No lobar consolidation.
IMPRESSION: Mild congestive heart failure.

Right internal jugular line with tip projecting over the low right
neck. Presuming low SVC position is desired, this should be advanced
approximately 11 cm.

## 2015-12-16 IMAGING — CR DG CHEST 1V PORT
1 series · 1 of 1 positions shown · non-contrast
Comparison: 03/30/2014

CLINICAL DATA: Central line placement

EXAM:
PORTABLE CHEST - 1 VIEW

[ap]
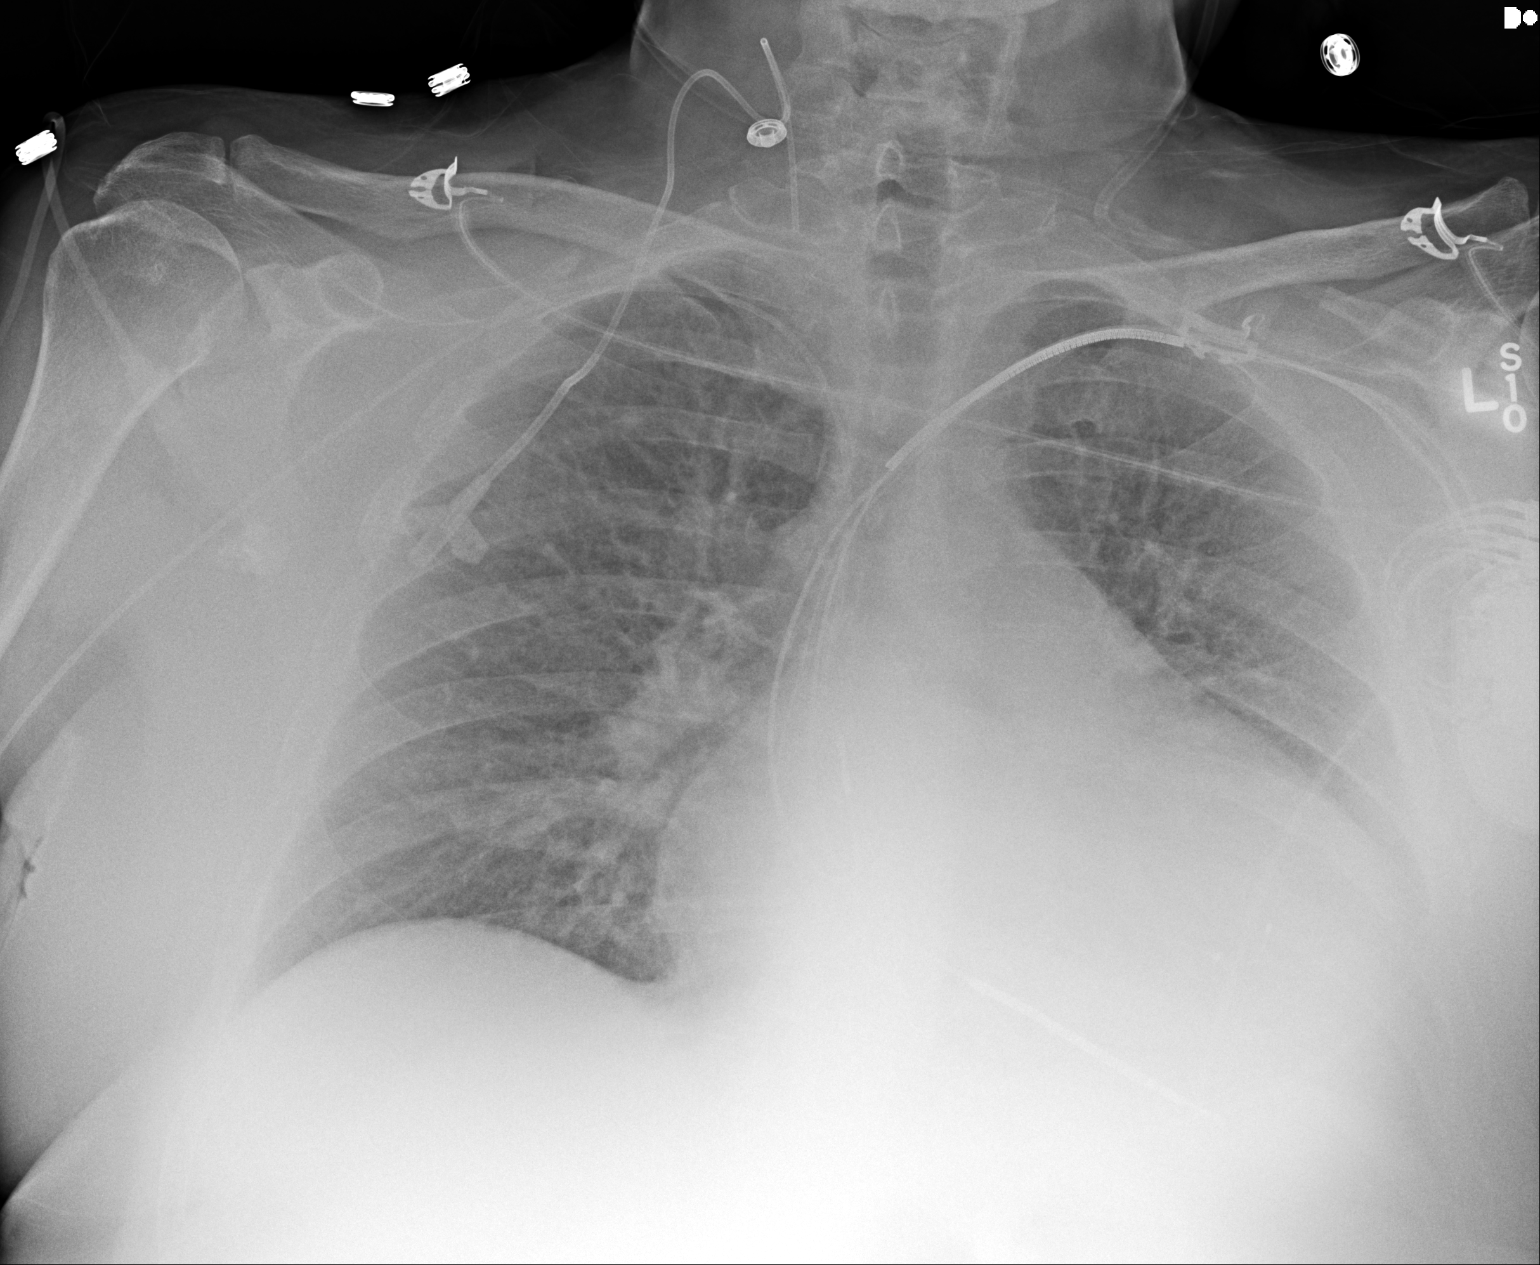

[1 of 1 positions shown; findings below may reference images not displayed]

FINDINGS: Marked cardiac enlargement. Pulmonary vascular congestion and mild
interstitial edema is unchanged. No significant effusion.

Right arm PICC tip enters the SVC with the tip not well visualized
due to pacemaker leads.

Right jugular catheter tip overlies the neck at the cervical
thoracic junction possibly and external jugular branch. This is
unchanged.
IMPRESSION: Congestive heart failure with mild edema

Right arm PICC tip enters the SVC with the tip not seen due to
overlying pacemaker leads

Right external jugular catheter tip remains in the lower neck
unchanged from the prior study.

## 2016-01-21 ENCOUNTER — Encounter (HOSPITAL_COMMUNITY): Payer: Self-pay | Admitting: Adult Health
